# Patient Record
Sex: Female | Born: 1940
Health system: Southern US, Community
[De-identification: ages and names within clinical notes are randomized; demographics above are authoritative.]

## PROBLEM LIST (undated history)

## (undated) DIAGNOSIS — I2510 Atherosclerotic heart disease of native coronary artery without angina pectoris: Secondary | ICD-10-CM

## (undated) DIAGNOSIS — R0600 Dyspnea, unspecified: Secondary | ICD-10-CM

## (undated) DIAGNOSIS — G4730 Sleep apnea, unspecified: Secondary | ICD-10-CM

## (undated) DIAGNOSIS — R600 Localized edema: Secondary | ICD-10-CM

## (undated) DIAGNOSIS — F329 Major depressive disorder, single episode, unspecified: Secondary | ICD-10-CM

## (undated) DIAGNOSIS — K861 Other chronic pancreatitis: Secondary | ICD-10-CM

## (undated) DIAGNOSIS — M549 Dorsalgia, unspecified: Secondary | ICD-10-CM

## (undated) DIAGNOSIS — I639 Cerebral infarction, unspecified: Secondary | ICD-10-CM

## (undated) DIAGNOSIS — F419 Anxiety disorder, unspecified: Secondary | ICD-10-CM

## (undated) DIAGNOSIS — F32A Depression, unspecified: Secondary | ICD-10-CM

## (undated) DIAGNOSIS — M1990 Unspecified osteoarthritis, unspecified site: Secondary | ICD-10-CM

## (undated) DIAGNOSIS — T4145XA Adverse effect of unspecified anesthetic, initial encounter: Secondary | ICD-10-CM

## (undated) DIAGNOSIS — M109 Gout, unspecified: Secondary | ICD-10-CM

## (undated) DIAGNOSIS — Z87442 Personal history of urinary calculi: Secondary | ICD-10-CM

## (undated) DIAGNOSIS — I10 Essential (primary) hypertension: Secondary | ICD-10-CM

## (undated) DIAGNOSIS — E785 Hyperlipidemia, unspecified: Secondary | ICD-10-CM

## (undated) DIAGNOSIS — I219 Acute myocardial infarction, unspecified: Secondary | ICD-10-CM

## (undated) DIAGNOSIS — E119 Type 2 diabetes mellitus without complications: Secondary | ICD-10-CM

## (undated) HISTORY — PX: CHOLECYSTECTOMY: SHX55

## (undated) HISTORY — DX: Dyspnea, unspecified: R06.00

## (undated) HISTORY — DX: Cerebral infarction, unspecified: I63.9

## (undated) HISTORY — PX: CARDIAC CATHETERIZATION: SHX172

## (undated) HISTORY — DX: Type 2 diabetes mellitus without complications: E11.9

## (undated) HISTORY — DX: Acute myocardial infarction, unspecified: I21.9

## (undated) HISTORY — DX: Localized edema: R60.0

## (undated) HISTORY — DX: Dorsalgia, unspecified: M54.9

## (undated) HISTORY — DX: Gout, unspecified: M10.9

## (undated) HISTORY — PX: BACK SURGERY: SHX140

## (undated) HISTORY — PX: CARDIAC SURGERY: SHX584

---

## 1999-12-04 HISTORY — PX: CHOLECYSTECTOMY: SHX55

## 1999-12-26 ENCOUNTER — Encounter: Admission: RE | Admit: 1999-12-26 | Discharge: 1999-12-26 | Payer: Self-pay | Admitting: Internal Medicine

## 2000-01-31 ENCOUNTER — Ambulatory Visit (HOSPITAL_COMMUNITY): Admission: RE | Admit: 2000-01-31 | Discharge: 2000-01-31 | Payer: Self-pay | Admitting: Obstetrics & Gynecology

## 2000-05-31 ENCOUNTER — Encounter: Payer: Self-pay | Admitting: Internal Medicine

## 2000-05-31 ENCOUNTER — Ambulatory Visit (HOSPITAL_BASED_OUTPATIENT_CLINIC_OR_DEPARTMENT_OTHER): Admission: RE | Admit: 2000-05-31 | Discharge: 2000-05-31 | Payer: Self-pay | Admitting: Internal Medicine

## 2000-08-09 ENCOUNTER — Encounter: Admission: RE | Admit: 2000-08-09 | Discharge: 2000-11-07 | Payer: Self-pay | Admitting: Internal Medicine

## 2000-10-03 ENCOUNTER — Encounter: Admission: RE | Admit: 2000-10-03 | Discharge: 2000-10-03 | Payer: Self-pay | Admitting: Internal Medicine

## 2000-12-31 ENCOUNTER — Encounter: Admission: RE | Admit: 2000-12-31 | Discharge: 2000-12-31 | Payer: Self-pay | Admitting: Internal Medicine

## 2001-04-03 ENCOUNTER — Encounter: Admission: RE | Admit: 2001-04-03 | Discharge: 2001-04-03 | Payer: Self-pay | Admitting: Internal Medicine

## 2001-05-12 ENCOUNTER — Ambulatory Visit (HOSPITAL_COMMUNITY): Admission: RE | Admit: 2001-05-12 | Discharge: 2001-05-12 | Payer: Self-pay | Admitting: Gynecology

## 2001-12-03 DIAGNOSIS — T8859XA Other complications of anesthesia, initial encounter: Secondary | ICD-10-CM

## 2001-12-03 HISTORY — DX: Other complications of anesthesia, initial encounter: T88.59XA

## 2002-01-23 ENCOUNTER — Encounter: Admission: RE | Admit: 2002-01-23 | Discharge: 2002-01-23 | Payer: Self-pay | Admitting: Internal Medicine

## 2002-06-14 ENCOUNTER — Ambulatory Visit (HOSPITAL_COMMUNITY): Admission: RE | Admit: 2002-06-14 | Discharge: 2002-06-14 | Payer: Self-pay | Admitting: Internal Medicine

## 2002-06-18 ENCOUNTER — Ambulatory Visit (HOSPITAL_COMMUNITY): Admission: RE | Admit: 2002-06-18 | Discharge: 2002-06-18 | Payer: Self-pay | Admitting: Gastroenterology

## 2002-06-18 ENCOUNTER — Encounter (INDEPENDENT_AMBULATORY_CARE_PROVIDER_SITE_OTHER): Payer: Self-pay | Admitting: Specialist

## 2002-07-13 ENCOUNTER — Encounter (INDEPENDENT_AMBULATORY_CARE_PROVIDER_SITE_OTHER): Payer: Self-pay | Admitting: Specialist

## 2002-07-13 ENCOUNTER — Inpatient Hospital Stay (HOSPITAL_COMMUNITY): Admission: RE | Admit: 2002-07-13 | Discharge: 2002-07-21 | Payer: Self-pay | Admitting: General Surgery

## 2002-07-13 ENCOUNTER — Encounter (INDEPENDENT_AMBULATORY_CARE_PROVIDER_SITE_OTHER): Payer: Self-pay | Admitting: Cardiovascular Disease

## 2002-07-28 ENCOUNTER — Encounter: Admission: RE | Admit: 2002-07-28 | Discharge: 2002-07-28 | Payer: Self-pay | Admitting: Internal Medicine

## 2002-12-03 HISTORY — PX: BACK SURGERY: SHX140

## 2003-02-19 ENCOUNTER — Encounter: Admission: RE | Admit: 2003-02-19 | Discharge: 2003-02-19 | Payer: Self-pay | Admitting: Internal Medicine

## 2003-11-10 ENCOUNTER — Encounter: Admission: RE | Admit: 2003-11-10 | Discharge: 2003-11-10 | Payer: Self-pay | Admitting: Internal Medicine

## 2003-11-30 ENCOUNTER — Emergency Department (HOSPITAL_COMMUNITY): Admission: EM | Admit: 2003-11-30 | Discharge: 2003-11-30 | Payer: Self-pay | Admitting: Emergency Medicine

## 2004-02-21 ENCOUNTER — Encounter: Admission: RE | Admit: 2004-02-21 | Discharge: 2004-02-21 | Payer: Self-pay | Admitting: Internal Medicine

## 2004-02-29 ENCOUNTER — Encounter: Admission: RE | Admit: 2004-02-29 | Discharge: 2004-02-29 | Payer: Self-pay | Admitting: Internal Medicine

## 2004-10-19 ENCOUNTER — Encounter: Admission: RE | Admit: 2004-10-19 | Discharge: 2004-10-19 | Payer: Self-pay | Admitting: Internal Medicine

## 2005-02-21 ENCOUNTER — Encounter: Admission: RE | Admit: 2005-02-21 | Discharge: 2005-02-21 | Payer: Self-pay | Admitting: Internal Medicine

## 2006-02-15 ENCOUNTER — Encounter: Admission: RE | Admit: 2006-02-15 | Discharge: 2006-02-15 | Payer: Self-pay | Admitting: Internal Medicine

## 2006-02-25 ENCOUNTER — Encounter: Admission: RE | Admit: 2006-02-25 | Discharge: 2006-02-25 | Payer: Self-pay | Admitting: Internal Medicine

## 2006-03-18 ENCOUNTER — Encounter: Admission: RE | Admit: 2006-03-18 | Discharge: 2006-03-18 | Payer: Self-pay | Admitting: Internal Medicine

## 2007-02-28 ENCOUNTER — Encounter: Admission: RE | Admit: 2007-02-28 | Discharge: 2007-02-28 | Payer: Self-pay | Admitting: Internal Medicine

## 2008-03-16 ENCOUNTER — Encounter: Admission: RE | Admit: 2008-03-16 | Discharge: 2008-03-16 | Payer: Self-pay | Admitting: Internal Medicine

## 2009-03-17 ENCOUNTER — Encounter: Admission: RE | Admit: 2009-03-17 | Discharge: 2009-03-17 | Payer: Self-pay | Admitting: Internal Medicine

## 2009-07-21 ENCOUNTER — Encounter: Admission: RE | Admit: 2009-07-21 | Discharge: 2009-07-21 | Payer: Self-pay | Admitting: Neurosurgery

## 2010-03-29 ENCOUNTER — Encounter: Admission: RE | Admit: 2010-03-29 | Discharge: 2010-03-29 | Payer: Self-pay | Admitting: Internal Medicine

## 2010-12-24 ENCOUNTER — Encounter: Payer: Self-pay | Admitting: Internal Medicine

## 2011-03-21 ENCOUNTER — Other Ambulatory Visit: Payer: Self-pay | Admitting: Internal Medicine

## 2011-03-21 DIAGNOSIS — Z1231 Encounter for screening mammogram for malignant neoplasm of breast: Secondary | ICD-10-CM

## 2011-04-03 ENCOUNTER — Ambulatory Visit
Admission: RE | Admit: 2011-04-03 | Discharge: 2011-04-03 | Disposition: A | Discharge status: Home / Self Care | Payer: Medicare Other | Source: Ambulatory Visit | Attending: Internal Medicine | Admitting: Internal Medicine

## 2011-04-03 DIAGNOSIS — Z1231 Encounter for screening mammogram for malignant neoplasm of breast: Secondary | ICD-10-CM

## 2011-04-20 NOTE — Op Note (Signed)
TNAMEPEACE, NOYES                         ACCOUNT NO.:  [id number]   MEDICAL RECORD NO.:  [id number]                   PATIENT TYPE:  INP   LOCATION:  0460                                 FACILITY:  [facility]   PHYSICIAN:  Ollen Gross. Vernell Morgans, M.D.              DATE OF BIRTH:  May 20, 1941   DATE OF PROCEDURE:  07/15/2002  DATE OF DISCHARGE:  07/21/2002                                 OPERATIVE REPORT   PREOPERATIVE DIAGNOSES:  1. Cholelithiasis.  2. Prior aborted laparoscopic cholecystectomy due to intraoperative     hypertension.   POSTOPERATIVE DIAGNOSES:  1. Cholelithiasis.  2. Prior aborted laparoscopic cholecystectomy due to intraoperative     hypertension.   PROCEDURE:  Laparoscopic cholecystectomy.   SURGEON:  Ollen Gross. Carolynne Edouard, M.D.   ASSISTANT:  Currie Paris, M.D.   ANESTHESIA:  General endotracheal.   DESCRIPTION OF PROCEDURE:  The patient's prior laparoscopic cholecystectomy  was aborted due a hypertensive episode during the initial surgery two days  ago.  The procedure was stopped, and she was taken to the ICU for further  evaluation of this episode and resuscitation.  Once she had stabilized, she  was kept intubated, and plans were made to bring her back to the operating  room today for completion cholecystectomy.  After informed consent was  obtained, the patient was brought to the operating room and placed in a  supine position on the operating room table.  After adequate induction of  general anesthesia, the patient's abdomen was prepped with Betadine and  draped in the usual sterile fashion.  Her staples from her prior procedure  were removed.  The supraumbilical incision was then reopened, and the 0  Vicryl pursestring stitch was cut.  The fascial edges were grabbed with  Kocher clamps, and a second 0 Vicryl pursestring stitch was placed.  A  Hasson cannula was placed through this opening and anchored in place with  the previously-placed Vicryl  pursestring stitch.  The abdomen was then  insufflated with carbon dioxide without difficulty.  The patient was placed  in a head-up position, and a laparoscope was placed through the Hasson  cannula.  The gallbladder was readily identified.  A 10 mm port was then  placed bluntly through the upper midline incision under direct vision  without difficulty.  The two 5 mm ports were then placed bluntly through the  prior incisions into the abdominal cavity again, under direct vision without  difficulty.  A blunt grasper was placed through the lateral most 5 mm port  and used to grasp the dome of the gallbladder and elevate it anteriorly and  superiorly.  Another blunt grasper was placed through the other 5 mm port  and used to retract on the body and neck of the gallbladder.  A dissector  was placed through the upper midline port.  The neck of the gallbladder was  identified during the  first procedure.  Three clips had been placed  proximally on the cystic duct and one distally and during this procedure,  the duct was divided with laparoscopic scissors.  Posterior to this, the  cystic artery was identified and dissected bluntly in a circumferential  manner until a good window was created.  Two clips were placed proximally  and one distally on the cystic artery, and the artery was divided between  the two.  Next, a laparoscopic hook device was used to separate the  gallbladder from the liver bed.  Prior to completely detaching the  gallbladder from the liver bed, the liver bed was inspected, and several  small bleeding points required coagulation with the electrocautery until the  area was completely hemostatic.  The gallbladder was then detached the rest  of the way from the liver bed.  An endoscopic bag was placed within the  patient, and the gallbladder was placed within the bag, and the bag was  sealed.  The laparoscope was then moved to the upper midline port, and a  gallbladder grasper was  placed through the Hasson cannula and grasped the  bag.  The bag and gallbladder were then removed with the Hasson cannula  through the supraumbilical port.  The Hasson cannula was then re-placed.  The abdomen was then irrigated with copious amounts of saline until the  effluent was clear.  The Hasson was then removed, and the fascia was closed  with the previously-placed Vicryl pursestring stitch under direct vision.  The rest of the ports were then removed under direct vision and were all  hemostatic, and the gas was allowed to escape.  The skin incisions were then  closed with interrupted 4-0 Monocryl subcuticular stitches.  Benzoin, Steri-  Strips, and sterile dressings were applied.  The patient tolerated the  procedure well.  At the end of the case, all needle, sponge, and instrument  counts were correct.  The patient was awakened and taken back to the ICU.  She was too sleepy to be extubated, and she was taken to the ICU for  monitoring at this point but in stable condition.                                               Ollen Gross. Vernell Morgans, M.D.    PST/MEDQ  D:  07/28/2002  T:  07/30/2002  Job:  [id number]

## 2011-04-20 NOTE — Procedures (Signed)
[facility]  Patient:    Judy [name], Judy [name] Visit Number: [id number] MRN: [id number]          Service Type: END Location: ENDO Attending Physician:  Nelda Marseille Dictated by:   Petra Kuba, M.D. Proc. Date: 06/18/02 Admit Date:  06/18/2002 Discharge Date: 06/18/2002   CC:         Erskine Speed, M.D.  Anselm Pancoast. Zachery Dakins, M.D.   Procedure Report  PROCEDURE:  Colonoscopy with polypectomy.  INDICATION:  Family history of colon cancer, due for colonic screening, abdominal pain, gallstones.  Consent was signed after risks, benefits, methods, and options thoroughly discussed in the office.  MEDICATIONS:  Demerol 70, Versed 7.  DESCRIPTION OF PROCEDURE:  Rectal inspection was pertinent for external hemorrhoids, small.  Digital exam was negative.  The pediatric video adjustable colonoscope was inserted and with some difficulty due to a tortuous colon, was able to be advanced to the cecum.  On insertion, some occasional left and right diverticula were seen.  No other abnormalities were seen as we advanced to cecum which was identified by the appendiceal orifice and the ileocecal valve.  This did require rolling her on her back and then on her right side with various abdominal pressures.  The scope was inserted a short ways for a short period of time into the terminal ileum but on quick evaluation, it was normal.  Scope was slowly withdrawn.  Prep was adequate. There was some liquid stool that required washing and suctioning.  On slow withdrawal through the colon, other than the left and right occasional scattered diverticula, two tiny descending, transverse polyps were seen and were not biopsied x 1 each.  No other abnormalities were seen other than the tortuous sigmoid as we withdrew back to the rectum.  Once back in the rectum, the scope was retroflexed, pertinent for some internal hemorrhoids.  The scope was straightened and readvanced a  short ways up the left side of the colon; air was suctioned, and the scope removed.  The patient tolerated the procedure adequately.  There was no obvious immediate complication.  ENDOSCOPIC DIAGNOSES: 1. Internal and external hemorrhoids. 2. Scattered left and right diverticula, mostly in the sigmoid and ascending. 3. Tortuous sigmoid. 4. Two tiny descending, transverse polyps, hot biopsied. 5. Otherwise within normal limits to the cecum with a quick look in the    terminal ileum.  PLAN: 1. Await pathology to determine future colonic screening. 2. Would probably proceed with a laparoscopic cholecystectomy but if there is    a question about etiologies of her pain, would get a one time upper GI    small-bowel follow-through next. 3. Happy to see back p.r.n. 4. Otherwise leave further work-up and plans to Dr. Chilton Si and Dr. Zachery Dakins. Dictated by:   Petra Kuba, M.D. Attending Physician:  Nelda Marseille DD:  06/18/02 TD:  06/23/02 Job: [id number]

## 2011-04-20 NOTE — Op Note (Signed)
Judy [name], Judy [name]                         ACCOUNT NO.:  [id number]   MEDICAL RECORD NO.:  [id number]                   PATIENT TYPE:  INP   LOCATION:  0157                                 FACILITY:  [facility]   PHYSICIAN:  Ollen Gross. Vernell Morgans, M.D.              DATE OF BIRTH:  12-28-1940   DATE OF PROCEDURE:  07/13/2002  DATE OF DISCHARGE:                                 OPERATIVE REPORT   PREOPERATIVE DIAGNOSIS:  Cholelithiasis.   POSTOPERATIVE DIAGNOSIS:  Cholelithiasis.   PROCEDURE:  Laparoscopic cholecystectomy/aborted.   SURGEON:  Ollen Gross. Carolynne Edouard, M.D.   ASSISTANT:  Dr. Earlene Plater.   ANESTHESIA:  General endotracheal.   DESCRIPTION OF PROCEDURE:  After informed consent was obtained, the patient  was brought to the operating room and placed in the supine position on the  operating room table.  After adequate induction of general anesthesia, the  patient's abdomen was prepped with Betadine and draped in the usual sterile  manner.  The area below the umbilicus was infiltrated with 0.25% Marcaine,  and a small, vertically-oriented incision was made.  This incision was  carried down through the subcutaneous tissue using blunt dissection with  Army-[employer]y retractors and a Kelly clamp until the linea alba was identified.  The linea alba was incised with a 15 blade knife, and each side was grasped  with Kocher clamps and elevated anteriorly.  The preperitoneal space was  then probed bluntly with a hemostat until the peritoneum was opened and  access was gained to the abdominal cavity.  A finger was inserted through  this opening, and there were no apparent adhesions to the anterior abdominal  wall.  A 0 Vicryl pursestring stitch was placed in the fascia surrounding  this opening.  A Hasson cannula was then placed through this opening and  anchored in place with the previously-placed Vicryl pursestring stitch.  The  abdomen was then insufflated with carbon dioxide without difficulty.  A  laparoscope was placed through the Hasson cannula, and the patient was  placed in a head-up position.  The liver edge and dome of the gallbladder  were readily identifiable.  There were no adhesions to the gallbladder.  After infiltrating this area with 0.25% Marcaine, a small, transverse, upper  midline incision was made with the 15 blade knife.  A 10 mm port was then  placed bluntly through this incision into the abdominal cavity under direct  vision.  Sites were then chosen laterally on the right side of the abdomen  for placement of 5 mm ports, and each of these areas were infiltrated with  0.25% Marcaine.  Small stab incisions were made with the 15 blade knife, and  the 5 mm ports were placed bluntly through these incisions into the  abdominal cavity under direct vision.  A blunt grasper was then placed  through the lateral-most 5 mm port and used to grasp the dome of  the  gallbladder and elevate it anteriorly and superiorly.  Another blunt grasper  was placed through the other 5 mm port and used to retract on the body and  neck of the gallbladder.  A dissector was placed through the upper midline  port and using a combination of the electrocautery and blunt dissection, the  gallbladder neck area was identified as well as the gallbladder neck cystic  duct junction.  This area was dissected bluntly in a circumferential manner  until a good window was created.  Care was taken to keep the common duct  medial to this dissection.  Three clips were then placed proximally and one  distally on the cystic duct.  At this point in time, the patient developed  an acute onset of hypotension.  The insufflation was released, and the  patient was placed in Trendelenburg position.  Volume resuscitation was  begun as well as the administration of dopamine, epinephrine, and Neo.  The  patient seemed to be unresponsive to these medicines and to the volume  resuscitation, although her heart rate remained in  the 70's.  Her oxygen  saturations also remained at 100, and she did not appear to be hypoxic in  any way.  The fluoroscopy machine was brought in, and her chest was  examined, and both lungs appeared to be inflating well without any evidence  for pneumothorax.  She had no subcutaneous air either in her chest wall or  in her neck.  Her ET tube appeared to be in right mainstem, and this was  backed up to just above the carina, but this did not change her clinical  course.  Cardiology was consulted intraoperatively, and they arrived very  quickly and did a transthoracic echocardiogram which showed good wall motion  of the heart and certainly no obvious appearance of any kind of infarction  or wall motion abnormality.  An arterial line as well as a central venous  introducer and Swan-Ganz catheter were introduced, and the arterial line  confirmed her blood pressure in the 60's systolic.  The Swan-Ganz catheter  was floated to the appropriate position, and her filling pressures appeared  to be adequate.  At this point, the laparoscope was inserted through the  upper midline port just to look above the liver one more time without  insufflating the abdomen, and there was no accumulation of blood in this  area.  At this point, the laparoscope was removed, and all of the ports were  removed.  The fascia of the infraumbilical port was closed with the  previously-placed Vicryl pursestring stitch.  Staples and sterile dressings  were applied.  The patient was then taken to the recovery room for further  observation and resuscitation.  Plans were made to, if the patient  stabilized, to bring her back to the operating room tomorrow to finish her  operation.                                               Ollen Gross. Vernell Morgans, M.D.    PST/MEDQ  D:  07/13/2002  T:  07/15/2002  Job:  [id number]

## 2011-04-20 NOTE — Discharge Summary (Signed)
Judy [name], Judy [name]                          ACCOUNT NO.:  [id number]   MEDICAL RECORD NO.:  [id number]                   PATIENT TYPE:  INP   LOCATION:  0460                                 FACILITY:  [facility]   PHYSICIAN:  Ollen Gross. Vernell Morgans, M.D.              DATE OF BIRTH:  08-09-1941   DATE OF ADMISSION:  07/13/2002  DATE OF DISCHARGE:  07/21/2002                                 DISCHARGE SUMMARY   HISTORY OF PRESENT ILLNESS:  Ms. Giebel is a 70 year old white female who  was brought to the hospital for a laparoscopic cholecystectomy  intraoperatively. About 20 minutes into the operation, she developed severe  hypotension. The operation was stopped. Care was taken to make sure this was  not due to intraoperative blood loss in the abdomen and no blood was  visualized in the abdomen. The fluoroscopy machine was used to make sure she  did not have a pneumothorax causing this, which she did not. The  Cardiologists were consulted and intraoperatively came and 2-D  echocardiogramed her heart which showed good LV function. A Swan-Ganz  catheter was placed that showed that she had good adequate filling pressure.  She was volume resuscitated and stared on Dopamine, Levophed, and  Epinephrine. The surgery at this point was abandoned. Her incisions were  closed with staples and dressed sterilely and taken to the ICU for further  evaluation. Resuscitation measures continued there and a Critical Care  consult was obtained. It was unclear what caused this episode but over the  next few hours, she slowly recovered and that evening, was awake and alert  on the ventilator and able to follow commands and move all four extremities.  Two days postoperative, she was taken back to the OR where her laparoscopic  cholecystectomy was completed. She tolerated this well. Postoperatively, she  was taken back to the ICU to be allowed to wake up fully before extubating  her, being that she was a difficult  intubation during the first operation.  By the next morning, she was able to be extubated. She did very well  hemodynamically. At this point, her Swan-Ganz catheter was discontinued.  Foley catheter was discontinued. She was started on a diet and advanced  slowly and by the 19th, was ready for discharge home.   CONDITION ON DISCHARGE:  Stable.   FINAL DIAGNOSES:  Cholecystitis, cholelithiasis.   MEDICATIONS:  1. She is to resume her home medications.  2. She is given prescription for Vicodin for pain.   DIET:  As tolerated.   ACTIVITY:  No heavy lifting.   FOLLOW UP:  With Dr. Carolynne Edouard in two weeks.   DISPOSITION:  To home.  Ollen Gross. Vernell Morgans, M.D.    PST/MEDQ  D:  08/05/2002  T:  08/06/2002  Job:  [id number] [id number]

## 2011-12-04 HISTORY — PX: COLONOSCOPY: SHX174

## 2012-02-13 ENCOUNTER — Other Ambulatory Visit: Payer: Self-pay | Admitting: Gastroenterology

## 2012-03-18 ENCOUNTER — Ambulatory Visit
Admission: RE | Admit: 2012-03-18 | Discharge: 2012-03-18 | Disposition: A | Discharge status: Home / Self Care | Payer: Medicare Other | Source: Ambulatory Visit | Attending: Neurosurgery | Admitting: Neurosurgery

## 2012-03-18 ENCOUNTER — Other Ambulatory Visit: Payer: Self-pay | Admitting: Neurosurgery

## 2012-03-18 DIAGNOSIS — M48061 Spinal stenosis, lumbar region without neurogenic claudication: Secondary | ICD-10-CM

## 2012-04-02 ENCOUNTER — Other Ambulatory Visit: Payer: Self-pay | Admitting: Internal Medicine

## 2012-04-02 DIAGNOSIS — Z1231 Encounter for screening mammogram for malignant neoplasm of breast: Secondary | ICD-10-CM

## 2012-04-11 ENCOUNTER — Ambulatory Visit
Admission: RE | Admit: 2012-04-11 | Discharge: 2012-04-11 | Disposition: A | Discharge status: Home / Self Care | Payer: Medicare Other | Source: Ambulatory Visit | Attending: Internal Medicine | Admitting: Internal Medicine

## 2012-04-11 DIAGNOSIS — Z1231 Encounter for screening mammogram for malignant neoplasm of breast: Secondary | ICD-10-CM

## 2013-04-07 ENCOUNTER — Other Ambulatory Visit: Payer: Self-pay

## 2013-04-07 DIAGNOSIS — Z1231 Encounter for screening mammogram for malignant neoplasm of breast: Secondary | ICD-10-CM

## 2013-05-05 ENCOUNTER — Ambulatory Visit
Admission: RE | Admit: 2013-05-05 | Discharge: 2013-05-05 | Disposition: A | Discharge status: Home / Self Care | Payer: Medicare Other | Source: Ambulatory Visit

## 2013-05-05 DIAGNOSIS — Z1231 Encounter for screening mammogram for malignant neoplasm of breast: Secondary | ICD-10-CM

## 2014-05-07 ENCOUNTER — Other Ambulatory Visit: Payer: Self-pay

## 2014-05-07 DIAGNOSIS — Z1231 Encounter for screening mammogram for malignant neoplasm of breast: Secondary | ICD-10-CM

## 2014-05-19 ENCOUNTER — Ambulatory Visit
Admission: RE | Admit: 2014-05-19 | Discharge: 2014-05-19 | Disposition: A | Discharge status: Home / Self Care | Payer: Medicare Other | Source: Ambulatory Visit

## 2014-05-19 DIAGNOSIS — Z1231 Encounter for screening mammogram for malignant neoplasm of breast: Secondary | ICD-10-CM

## 2014-12-14 DIAGNOSIS — H2513 Age-related nuclear cataract, bilateral: Secondary | ICD-10-CM | POA: Diagnosis not present

## 2015-01-05 DIAGNOSIS — G4733 Obstructive sleep apnea (adult) (pediatric): Secondary | ICD-10-CM | POA: Diagnosis not present

## 2015-01-13 DIAGNOSIS — K5750 Diverticulosis of both small and large intestine without perforation or abscess without bleeding: Secondary | ICD-10-CM | POA: Diagnosis not present

## 2015-01-13 DIAGNOSIS — I10 Essential (primary) hypertension: Secondary | ICD-10-CM | POA: Diagnosis not present

## 2015-03-02 ENCOUNTER — Other Ambulatory Visit (HOSPITAL_COMMUNITY): Payer: Self-pay | Admitting: Internal Medicine

## 2015-03-02 ENCOUNTER — Encounter (HOSPITAL_COMMUNITY): Payer: Self-pay

## 2015-03-02 ENCOUNTER — Ambulatory Visit (HOSPITAL_COMMUNITY)
Admission: RE | Admit: 2015-03-02 | Discharge: 2015-03-02 | Disposition: A | Discharge status: Home / Self Care | Payer: Medicare Other | Source: Ambulatory Visit | Attending: Internal Medicine | Admitting: Internal Medicine

## 2015-03-02 DIAGNOSIS — I2510 Atherosclerotic heart disease of native coronary artery without angina pectoris: Secondary | ICD-10-CM | POA: Diagnosis not present

## 2015-03-02 DIAGNOSIS — R778 Other specified abnormalities of plasma proteins: Secondary | ICD-10-CM | POA: Insufficient documentation

## 2015-03-02 DIAGNOSIS — R0789 Other chest pain: Secondary | ICD-10-CM | POA: Diagnosis not present

## 2015-03-02 DIAGNOSIS — Z86711 Personal history of pulmonary embolism: Secondary | ICD-10-CM

## 2015-03-02 DIAGNOSIS — R791 Abnormal coagulation profile: Secondary | ICD-10-CM | POA: Diagnosis not present

## 2015-03-02 DIAGNOSIS — R079 Chest pain, unspecified: Secondary | ICD-10-CM | POA: Insufficient documentation

## 2015-03-02 DIAGNOSIS — R799 Abnormal finding of blood chemistry, unspecified: Secondary | ICD-10-CM | POA: Diagnosis not present

## 2015-03-02 HISTORY — DX: Essential (primary) hypertension: I10

## 2015-03-02 LAB — POCT I-STAT CREATININE: Creatinine, Ser: 1.4 mg/dL — ABNORMAL HIGH (ref 0.50–1.10)

## 2015-03-02 MED ORDER — IOHEXOL 350 MG/ML SOLN
100.0000 mL | Freq: Once | INTRAVENOUS | Status: AC | PRN
  Administered 2015-03-02: 80 mL via INTRAVENOUS

## 2015-05-05 ENCOUNTER — Other Ambulatory Visit: Payer: Self-pay

## 2015-05-05 DIAGNOSIS — Z1231 Encounter for screening mammogram for malignant neoplasm of breast: Secondary | ICD-10-CM

## 2015-05-11 DIAGNOSIS — G4733 Obstructive sleep apnea (adult) (pediatric): Secondary | ICD-10-CM | POA: Diagnosis not present

## 2015-06-02 ENCOUNTER — Ambulatory Visit
Admission: RE | Admit: 2015-06-02 | Discharge: 2015-06-02 | Disposition: A | Discharge status: Home / Self Care | Payer: Medicare Other | Source: Ambulatory Visit

## 2015-06-02 DIAGNOSIS — Z1231 Encounter for screening mammogram for malignant neoplasm of breast: Secondary | ICD-10-CM | POA: Diagnosis not present

## 2015-06-28 DIAGNOSIS — D559 Anemia due to enzyme disorder, unspecified: Secondary | ICD-10-CM | POA: Diagnosis not present

## 2015-06-28 DIAGNOSIS — E118 Type 2 diabetes mellitus with unspecified complications: Secondary | ICD-10-CM | POA: Diagnosis not present

## 2015-06-28 DIAGNOSIS — I10 Essential (primary) hypertension: Secondary | ICD-10-CM | POA: Diagnosis not present

## 2015-06-28 DIAGNOSIS — E282 Polycystic ovarian syndrome: Secondary | ICD-10-CM | POA: Diagnosis not present

## 2015-06-28 DIAGNOSIS — E039 Hypothyroidism, unspecified: Secondary | ICD-10-CM | POA: Diagnosis not present

## 2015-06-28 DIAGNOSIS — Z23 Encounter for immunization: Secondary | ICD-10-CM | POA: Diagnosis not present

## 2015-06-28 DIAGNOSIS — E780 Pure hypercholesterolemia: Secondary | ICD-10-CM | POA: Diagnosis not present

## 2015-06-28 DIAGNOSIS — Z0000 Encounter for general adult medical examination without abnormal findings: Secondary | ICD-10-CM | POA: Diagnosis not present

## 2015-08-12 DIAGNOSIS — G4733 Obstructive sleep apnea (adult) (pediatric): Secondary | ICD-10-CM | POA: Diagnosis not present

## 2015-08-23 DIAGNOSIS — G4733 Obstructive sleep apnea (adult) (pediatric): Secondary | ICD-10-CM | POA: Diagnosis not present

## 2015-09-22 DIAGNOSIS — G4733 Obstructive sleep apnea (adult) (pediatric): Secondary | ICD-10-CM | POA: Diagnosis not present

## 2015-10-23 DIAGNOSIS — G4733 Obstructive sleep apnea (adult) (pediatric): Secondary | ICD-10-CM | POA: Diagnosis not present

## 2015-11-07 DIAGNOSIS — I10 Essential (primary) hypertension: Secondary | ICD-10-CM | POA: Diagnosis not present

## 2015-11-07 DIAGNOSIS — R32 Unspecified urinary incontinence: Secondary | ICD-10-CM | POA: Diagnosis not present

## 2015-11-22 DIAGNOSIS — G4733 Obstructive sleep apnea (adult) (pediatric): Secondary | ICD-10-CM | POA: Diagnosis not present

## 2015-12-23 DIAGNOSIS — G4733 Obstructive sleep apnea (adult) (pediatric): Secondary | ICD-10-CM | POA: Diagnosis not present

## 2016-01-18 DIAGNOSIS — G4733 Obstructive sleep apnea (adult) (pediatric): Secondary | ICD-10-CM | POA: Diagnosis not present

## 2016-01-23 DIAGNOSIS — G4733 Obstructive sleep apnea (adult) (pediatric): Secondary | ICD-10-CM | POA: Diagnosis not present

## 2016-02-02 DIAGNOSIS — M109 Gout, unspecified: Secondary | ICD-10-CM | POA: Diagnosis not present

## 2016-02-07 DIAGNOSIS — M109 Gout, unspecified: Secondary | ICD-10-CM | POA: Diagnosis not present

## 2016-02-20 DIAGNOSIS — G4733 Obstructive sleep apnea (adult) (pediatric): Secondary | ICD-10-CM | POA: Diagnosis not present

## 2016-03-03 DIAGNOSIS — K861 Other chronic pancreatitis: Secondary | ICD-10-CM

## 2016-03-03 HISTORY — DX: Other chronic pancreatitis: K86.1

## 2016-03-16 ENCOUNTER — Emergency Department (HOSPITAL_COMMUNITY)
Admission: EM | Admit: 2016-03-16 | Discharge: 2016-03-17 | Disposition: A | Discharge status: Home / Self Care | Payer: Medicare Other | Attending: Emergency Medicine | Admitting: Emergency Medicine

## 2016-03-16 ENCOUNTER — Emergency Department (HOSPITAL_COMMUNITY): Payer: Medicare Other

## 2016-03-16 ENCOUNTER — Encounter (HOSPITAL_COMMUNITY): Payer: Self-pay

## 2016-03-16 DIAGNOSIS — R0600 Dyspnea, unspecified: Secondary | ICD-10-CM | POA: Diagnosis not present

## 2016-03-16 DIAGNOSIS — I129 Hypertensive chronic kidney disease with stage 1 through stage 4 chronic kidney disease, or unspecified chronic kidney disease: Secondary | ICD-10-CM | POA: Insufficient documentation

## 2016-03-16 DIAGNOSIS — F419 Anxiety disorder, unspecified: Secondary | ICD-10-CM | POA: Diagnosis not present

## 2016-03-16 DIAGNOSIS — R1010 Upper abdominal pain, unspecified: Secondary | ICD-10-CM | POA: Diagnosis present

## 2016-03-16 DIAGNOSIS — R1084 Generalized abdominal pain: Secondary | ICD-10-CM

## 2016-03-16 DIAGNOSIS — F329 Major depressive disorder, single episode, unspecified: Secondary | ICD-10-CM | POA: Insufficient documentation

## 2016-03-16 DIAGNOSIS — R1013 Epigastric pain: Secondary | ICD-10-CM | POA: Diagnosis not present

## 2016-03-16 DIAGNOSIS — K5730 Diverticulosis of large intestine without perforation or abscess without bleeding: Secondary | ICD-10-CM | POA: Diagnosis not present

## 2016-03-16 DIAGNOSIS — N179 Acute kidney failure, unspecified: Secondary | ICD-10-CM | POA: Diagnosis not present

## 2016-03-16 DIAGNOSIS — Z79899 Other long term (current) drug therapy: Secondary | ICD-10-CM | POA: Diagnosis not present

## 2016-03-16 DIAGNOSIS — N189 Chronic kidney disease, unspecified: Secondary | ICD-10-CM | POA: Diagnosis not present

## 2016-03-16 HISTORY — DX: Depression, unspecified: F32.A

## 2016-03-16 HISTORY — DX: Major depressive disorder, single episode, unspecified: F32.9

## 2016-03-16 HISTORY — DX: Anxiety disorder, unspecified: F41.9

## 2016-03-16 MED ORDER — ALBUTEROL SULFATE (2.5 MG/3ML) 0.083% IN NEBU
5.0000 mg | INHALATION_SOLUTION | Freq: Once | RESPIRATORY_TRACT | Status: AC
  Administered 2016-03-16: 5 mg via RESPIRATORY_TRACT
  Filled 2016-03-16: qty 6

## 2016-03-16 MED ORDER — ONDANSETRON HCL 4 MG/2ML IJ SOLN
4.0000 mg | Freq: Once | INTRAMUSCULAR | Status: AC
  Administered 2016-03-17: 4 mg via INTRAVENOUS
  Filled 2016-03-16: qty 2

## 2016-03-16 MED ORDER — FENTANYL CITRATE (PF) 100 MCG/2ML IJ SOLN
50.0000 ug | Freq: Once | INTRAMUSCULAR | Status: AC
  Administered 2016-03-17: 50 ug via INTRAVENOUS
  Filled 2016-03-16: qty 2

## 2016-03-16 MED ORDER — SODIUM CHLORIDE 0.9 % IV SOLN
INTRAVENOUS | Status: DC
  Administered 2016-03-17: via INTRAVENOUS

## 2016-03-16 NOTE — ED Notes (Signed)
Pt arrives w/ c/o sob and 8/10 epigastric pain. Pt states this has been happening since Tuesday. Pt A+OX4.

## 2016-03-16 NOTE — ED Provider Notes (Addendum)
By signing my name below, I, Judy [name], attest that this documentation has been prepared under the direction and in the presence of [employer], DO. Electronically Signed: Randa [name], ED Scribe. 03/17/2016. 12:14 AM.  TIME SEEN: 11:40 PM  CHIEF COMPLAINT: Abdominal Pain and SOB  HPI: HPI Comments: Judy [name] is a 75 y.o. female with history of hypertension who presents to the Emergency Department complaining of aching-throbbing upper abdominal pain without radiation onset 3 days prior. Pt reports that the onset of pain began 3 days prior after eating a hotdog. Pt states that she has also been feeling SOB 2 days prior. No SOB now.  Pt reports nausea, diarrhea and abdominal distention. Pt states she has tried Tums with no relief. Pt states that she is not producing flatus only belching the last 3 days. Pt denies CP or chest discomfort, vomiting, blood in stool, melena, dysuria, hematuria, vaginal bleeding or vaginal discharge. Reports Hx of hysterectomy and cholecystectomy . Denies HX of ulcers. Has never had an endoscopy. Pt denies frequent NSAID use. Pt denies alcohol or tobacco use.    PCP - Dr. Nyoka Cowden  ROS: See HPI Constitutional: no fever  Eyes: no drainage  ENT: no runny nose   Cardiovascular:  no chest pain  Resp: no SOB  GI: no vomiting GU: no dysuria Integumentary: no rash  Allergy: no hives  Musculoskeletal: no leg swelling  Neurological: no slurred speech ROS otherwise negative  PAST MEDICAL HISTORY/PAST SURGICAL HISTORY:  Past Medical History  Diagnosis Date  . Hypertension   . Anxiety   . Depression     MEDICATIONS:  Prior to Admission medications   Medication Sig Start Date End Date Taking? Authorizing Provider  clorazepate (TRANXENE) 3.75 MG tablet Take 3.75 mg by mouth at bedtime.  03/14/16  Yes Historical Provider, MD  diltiazem (CARDIZEM CD) 240 MG 24 hr capsule Take 240 mg by mouth daily.  03/05/16  Yes Historical Provider, MD  imipramine  (TOFRANIL) 50 MG tablet Take 50 mg by mouth at bedtime.   Yes Historical Provider, MD  lisinopril (PRINIVIL,ZESTRIL) 40 MG tablet Take 40 mg by mouth daily.  01/11/16  Yes Historical Provider, MD  spironolactone (ALDACTONE) 50 MG tablet Take 50 mg by mouth daily.  03/05/16  Yes Historical Provider, MD    ALLERGIES:  No Known Allergies  SOCIAL HISTORY:  Social History  Substance Use Topics  . Smoking status: Never Smoker   . Smokeless tobacco: Not on file  . Alcohol Use: No    FAMILY HISTORY: History reviewed. No pertinent family history.  EXAM: BP 116/58 mmHg  Pulse 101  Temp(Src) 97.7 F (36.5 C) (Oral)  Resp 23  SpO2 97%   CONSTITUTIONAL: Alert and oriented and responds appropriately to questions. Well-appearing; well-nourished, elderly  HEAD: Normocephalic EYES: Conjunctivae clear, PERRL ENT: normal nose; no rhinorrhea; moist mucous membranes NECK: Supple, no meningismus, no LAD  CARD: RRR; S1 and S2 appreciated; no murmurs, no clicks, no rubs, no gallops RESP: Normal chest excursion without splinting or tachypnea; breath sounds clear and equal bilaterally; no wheezes, no rhonchi, no rales, no hypoxia or respiratory distress, speaking full sentences ABD/GI: Normal bowel sounds; tender to palpation diffusely worse throughout upper abdomen and distended without obvious timpani or fluid wave, no rebound, no guarding, no peritoneal signs BACK:  The back appears normal and is non-tender to palpation, there is no CVA tenderness EXT: Normal ROM in all joints; non-tender to palpation; no edema; normal capillary refill; no cyanosis,  no calf tenderness or swelling    SKIN: Normal color for age and race; warm; no rash NEURO: Moves all extremities equally, sensation to light touch intact diffusely, cranial nerves II through XII intact PSYCH: The patient's mood and manner are appropriate. Grooming and personal hygiene are appropriate.  MEDICAL DECISION MAKING: Patient here with abdominal  pain after eating a hot dog. Has had a cholecystectomy. No history of peptic ulcer disease, alcohol or NSAID use. Abdomen does appear distended and she is diffusely tender worse without the upper abdomen. Differential diagnosis includes pancreatitis, gastritis, colitis, less likely diverticulitis, pyelonephritis. Will obtain labs, CT of abdomen and pelvis, urine. EKG shows no ischemic abnormality. I doubt this is cardiac in nature. Chest x-ray ordered in triage is negative. Lungs are clear to auscultation. No hypoxia currently.  ED PROGRESS: 2:40 AM  Pt still having pain.  Labs show leukocytosis with left shift. She does also have mild acute on chronic renal failure.  She has received IV hydration. Urine shows trace leukocytes and few bacteria but no significant sign of infection. CT scan shows no bowel obstruction. She has diverticulosis without diverticulitis. She still having some discomfort. We'll give GI cocktail, Protonix and reassess.  4:30 AM  Pt reports feeling much better. She does have brief drop in her oxygen saturation when she is asleep it comes back up to 94% on room air at rest. No current shortness of breath. Lungs are clear to auscultation and she had a clear chest x-ray. Suspect she may have some component of sleep apnea.  Have advised her of her mildly elevated creatinine and recommended close outpatient follow-up for recheck. Recommend she increase her fluid intake. We'll start her on Protonix as a suspect there may be some component of gastritis to this. Have advised her to avoid NSAIDs because of her epigastric pain but also because of her acute on chronic renal failure. We'll discharge on Carafate and Zofran to take as well as needed for pain. Will give outpatient GI follow-up. She has a PCP for follow-up. Doubt that this is ACS, PE, dissection. She is status post cholecystectomy. LFTs and lipase are normal today.   At this time, I do not feel there is any life-threatening condition  present. I have reviewed and discussed all results (EKG, imaging, lab, urine as appropriate), exam findings with patient. I have reviewed nursing notes and appropriate previous records.  I feel the patient is safe to be discharged home without further emergent workup. Discussed usual and customary return precautions. Patient and family (if present) verbalize understanding and are comfortable with this plan.  Patient will follow-up with their primary care provider. If they do not have a primary care provider, information for follow-up has been provided to them. All questions have been answered.      EKG Interpretation  Date/Time:  Friday March 16 2016 22:00:13 EDT Ventricular Rate:  98 PR Interval:  188 QRS Duration: 95 QT Interval:  343 QTC Calculation: 438 R Axis:   90 Text Interpretation:  Sinus rhythm Borderline right axis deviation Low voltage, precordial leads No significant change since last tracing Confirmed by Porsha Skilton,  DO, Revonda Menter [phone]) on 03/16/2016 11:27:36 PM         I personally performed the services described in this documentation, which was scribed in my presence. The recorded information has been reviewed and is accurate.    [place], DO 03/17/16 [place], DO 03/17/16 [phone]

## 2016-03-17 ENCOUNTER — Emergency Department (HOSPITAL_COMMUNITY): Payer: Medicare Other

## 2016-03-17 DIAGNOSIS — K5730 Diverticulosis of large intestine without perforation or abscess without bleeding: Secondary | ICD-10-CM | POA: Diagnosis not present

## 2016-03-17 LAB — CBC WITH DIFFERENTIAL/PLATELET
Basophils Absolute: 0 10*3/uL (ref 0.0–0.1)
Basophils Relative: 0 %
EOS ABS: 0.1 10*3/uL (ref 0.0–0.7)
Eosinophils Relative: 1 %
HEMATOCRIT: 39.9 % (ref 36.0–46.0)
HEMOGLOBIN: 13.3 g/dL (ref 12.0–15.0)
LYMPHS ABS: 1.1 10*3/uL (ref 0.7–4.0)
Lymphocytes Relative: 6 %
MCH: 30.6 pg (ref 26.0–34.0)
MCHC: 33.3 g/dL (ref 30.0–36.0)
MCV: 91.9 fL (ref 78.0–100.0)
MONOS PCT: 4 %
Monocytes Absolute: 0.6 10*3/uL (ref 0.1–1.0)
NEUTROS ABS: 15.5 10*3/uL — AB (ref 1.7–7.7)
Neutrophils Relative %: 89 %
Platelets: 345 10*3/uL (ref 150–400)
RBC: 4.34 MIL/uL (ref 3.87–5.11)
RDW: 12.7 % (ref 11.5–15.5)
WBC: 17.4 10*3/uL — ABNORMAL HIGH (ref 4.0–10.5)

## 2016-03-17 LAB — COMPREHENSIVE METABOLIC PANEL
ALK PHOS: 99 U/L (ref 38–126)
ALT: 16 U/L (ref 14–54)
ANION GAP: 13 (ref 5–15)
AST: 16 U/L (ref 15–41)
Albumin: 4.6 g/dL (ref 3.5–5.0)
BILIRUBIN TOTAL: 0.5 mg/dL (ref 0.3–1.2)
BUN: 43 mg/dL — ABNORMAL HIGH (ref 6–20)
CO2: 19 mmol/L — ABNORMAL LOW (ref 22–32)
Calcium: 9.6 mg/dL (ref 8.9–10.3)
Chloride: 106 mmol/L (ref 101–111)
Creatinine, Ser: 2.22 mg/dL — ABNORMAL HIGH (ref 0.44–1.00)
GFR calc Af Amer: 24 mL/min — ABNORMAL LOW (ref >60)
GFR, EST NON AFRICAN AMERICAN: 21 mL/min — AB (ref >60)
Glucose, Bld: 167 mg/dL — ABNORMAL HIGH (ref 65–99)
Potassium: 4.5 mmol/L (ref 3.5–5.1)
Sodium: 138 mmol/L (ref 135–145)
Total Protein: 8.1 g/dL (ref 6.5–8.1)

## 2016-03-17 LAB — URINALYSIS, ROUTINE W REFLEX MICROSCOPIC
Bilirubin Urine: NEGATIVE
Glucose, UA: NEGATIVE mg/dL
HGB URINE DIPSTICK: NEGATIVE
Ketones, ur: NEGATIVE mg/dL
Nitrite: NEGATIVE
PROTEIN: NEGATIVE mg/dL
Specific Gravity, Urine: 1.01 (ref 1.005–1.030)
pH: 5 (ref 5.0–8.0)

## 2016-03-17 LAB — URINE MICROSCOPIC-ADD ON: RBC / HPF: NONE SEEN RBC/hpf (ref 0–5)

## 2016-03-17 LAB — I-STAT TROPONIN, ED: Troponin i, poc: 0 ng/mL (ref 0.00–0.08)

## 2016-03-17 LAB — LIPASE, BLOOD: LIPASE: 28 U/L (ref 11–51)

## 2016-03-17 MED ORDER — GI COCKTAIL ~~LOC~~
30.0000 mL | Freq: Once | ORAL | Status: AC
  Administered 2016-03-17: 30 mL via ORAL
  Filled 2016-03-17: qty 30

## 2016-03-17 MED ORDER — PANTOPRAZOLE SODIUM 40 MG PO TBEC: 40.0000 mg | DELAYED_RELEASE_TABLET | Freq: Every day | ORAL | Status: DC

## 2016-03-17 MED ORDER — PANTOPRAZOLE SODIUM 40 MG IV SOLR
40.0000 mg | Freq: Once | INTRAVENOUS | Status: AC
  Administered 2016-03-17: 40 mg via INTRAVENOUS
  Filled 2016-03-17: qty 40

## 2016-03-17 MED ORDER — FENTANYL CITRATE (PF) 100 MCG/2ML IJ SOLN
50.0000 ug | Freq: Once | INTRAMUSCULAR | Status: AC
  Administered 2016-03-17: 50 ug via INTRAVENOUS
  Filled 2016-03-17: qty 2

## 2016-03-17 MED ORDER — DIATRIZOATE MEGLUMINE & SODIUM 66-10 % PO SOLN
30.0000 mL | Freq: Once | ORAL | Status: AC
  Administered 2016-03-17: 30 mL via ORAL
  Filled 2016-03-17: qty 30

## 2016-03-17 MED ORDER — ONDANSETRON 4 MG PO TBDP: 4.0000 mg | ORAL_TABLET | Freq: Three times a day (TID) | ORAL | Status: DC | PRN

## 2016-03-17 MED ORDER — SUCRALFATE 1 GM/10ML PO SUSP: 1.0000 g | Freq: Three times a day (TID) | ORAL | Status: DC

## 2016-03-17 NOTE — Discharge Instructions (Signed)
Please follow-up with your primary care physician in one week to have your kidney function rechecked. Your creatinine today was 2.22 and was recently 1.40. Please avoid NSAIDs such as aspirin, Aleve, ibuprofen, Motrin, Advil, Goody powders. It is okay to take Tylenol 1000 g every 6 hours as needed for pain. Please avoid alcohol. I suspect that your abdominal pain is from inflammation of your stomach called gastritis or acid reflux. We are discharging you with medications to start taking for this. Her pain continues, please follow-up with your PCP or a gastroenterologist. If your pain significantly worsens, you have chest pain or chest discomfort, vomiting and cannot stop, black stools, blood in your stool, please return to the hospital.   Acute Kidney Injury Acute kidney injury is any condition in which there is sudden (acute) damage to the kidneys. Acute kidney injury was previously known as acute kidney failure or acute renal failure. The kidneys are two organs that lie on either side of the spine between the middle of the back and the front of the abdomen. The kidneys:  Remove wastes and extra water from the blood.   Produce important hormones. These help keep bones strong, regulate blood pressure, and help create red blood cells.   Balance the fluids and chemicals in the blood and tissues. A small amount of kidney damage may not cause problems, but a large amount of damage may make it difficult or impossible for the kidneys to work the way they should. Acute kidney injury may develop into long-lasting (chronic) kidney disease. It may also develop into a life-threatening disease called end-stage kidney disease. Acute kidney injury can get worse very quickly, so it should be treated right away. Early treatment may prevent other kidney diseases from developing. CAUSES   A problem with blood flow to the kidneys. This may be caused by:   Blood loss.   Heart disease.   Severe burns.   Liver  disease.  Direct damage to the kidneys. This may be caused by:  Some medicines.   A kidney infection.   Poisoning or consuming toxic substances.   A surgical wound.   A blow to the kidney area.   A problem with urine flow. This may be caused by:   Cancer.   Kidney stones.   An enlarged prostate. SIGNS AND SYMPTOMS   Swelling (edema) of the legs, ankles, or feet.   Tiredness (lethargy).   Nausea or vomiting.   Confusion.   Problems with urination, such as:   Painful or burning feeling during urination.   Decreased urine production.   Frequent accidents in children who are potty trained.   Bloody urine.   Muscle twitches and cramps.   Shortness of breath.   Seizures.   Chest pain or pressure. Sometimes, no symptoms are present. DIAGNOSIS Acute kidney injury may be detected and diagnosed by tests, including blood, urine, imaging, or kidney biopsy tests.  TREATMENT Treatment of acute kidney injury varies depending on the cause and severity of the kidney damage. In mild cases, no treatment may be needed. The kidneys may heal on their own. If acute kidney injury is more severe, your health care provider will treat the cause of the kidney damage, help the kidneys heal, and prevent complications from occurring. Severe cases may require a procedure to remove toxic wastes from the body (dialysis) or surgery to repair kidney damage. Surgery may involve:   Repair of a torn kidney.   Removal of an obstruction. HOME CARE INSTRUCTIONS  Follow your prescribed diet.  Take medicines only as directed by your health care provider.  Do not take any new medicines (prescription, over-the-counter, or nutritional supplements) unless approved by your health care provider. Many medicines can worsen your kidney damage or may need to have the dose adjusted.   Keep all follow-up visits as directed by your health care provider. This is important.  Observe  your condition to make sure you are healing as expected. SEEK IMMEDIATE MEDICAL CARE IF:  You are feeling ill or have severe pain in the back or side.   Your symptoms return or you have new symptoms.  You have any symptoms of end-stage kidney disease. These include:   Persistent itchiness.   Loss of appetite.   Headaches.   Abnormally dark or light skin.  Numbness in the hands or feet.   Easy bruising.   Frequent hiccups.   Menstruation stops.   You have a fever.  You have increased urine production.  You have pain or bleeding when urinating. MAKE SURE YOU:   Understand these instructions.  Will watch your condition.  Will get help right away if you are not doing well or get worse.   This information is not intended to replace advice given to you by your health care provider. Make sure you discuss any questions you have with your health care provider.   Document Released: 06/04/2011 Document Revised: 12/10/2014 Document Reviewed: 07/18/2012 Elsevier Interactive Patient Education 2016 Elsevier Inc[employer]     Gastritis, Adult Gastritis is soreness and swelling (inflammation) of the lining of the stomach. Gastritis can develop as a sudden onset (acute) or long-term (chronic) condition. If gastritis is not treated, it can lead to stomach bleeding and ulcers. CAUSES  Gastritis occurs when the stomach lining is weak or damaged. Digestive juices from the stomach then inflame the weakened stomach lining. The stomach lining may be weak or damaged due to viral or bacterial infections. One common bacterial infection is the Helicobacter pylori infection. Gastritis can also result from excessive alcohol consumption, taking certain medicines, or having too much acid in the stomach.  SYMPTOMS  In some cases, there are no symptoms. When symptoms are present, they may include:  Pain or a burning sensation in the upper abdomen.  Nausea.  Vomiting.  An uncomfortable  feeling of fullness after eating. DIAGNOSIS  Your caregiver may suspect you have gastritis based on your symptoms and a physical exam. To determine the cause of your gastritis, your caregiver may perform the following:  Blood or stool tests to check for the H pylori bacterium.  Gastroscopy. A thin, flexible tube (endoscope) is passed down the esophagus and into the stomach. The endoscope has a light and camera on the end. Your caregiver uses the endoscope to view the inside of the stomach.  Taking a tissue sample (biopsy) from the stomach to examine under a microscope. TREATMENT  Depending on the cause of your gastritis, medicines may be prescribed. If you have a bacterial infection, such as an H pylori infection, antibiotics may be given. If your gastritis is caused by too much acid in the stomach, H2 blockers or antacids may be given. Your caregiver may recommend that you stop taking aspirin, ibuprofen, or other nonsteroidal anti-inflammatory drugs (NSAIDs). HOME CARE INSTRUCTIONS  Only take over-the-counter or prescription medicines as directed by your caregiver.  If you were given antibiotic medicines, take them as directed. Finish them even if you start to feel better.  Drink enough fluids to keep  your urine clear or pale yellow.  Avoid foods and drinks that make your symptoms worse, such as:  Caffeine or alcoholic drinks.  Chocolate.  Peppermint or mint flavorings.  Garlic and onions.  Spicy foods.  Citrus fruits, such as oranges, lemons, or limes.  Tomato-based foods such as sauce, chili, salsa, and pizza.  Fried and fatty foods.  Eat small, frequent meals instead of large meals. SEEK IMMEDIATE MEDICAL CARE IF:   You have black or dark red stools.  You vomit blood or material that looks like coffee grounds.  You are unable to keep fluids down.  Your abdominal pain gets worse.  You have a fever.  You do not feel better after 1 week.  You have any other  questions or concerns. MAKE SURE YOU:  Understand these instructions.  Will watch your condition.  Will get help right away if you are not doing well or get worse.   This information is not intended to replace advice given to you by your health care provider. Make sure you discuss any questions you have with your health care provider.   Document Released: 11/13/2001 Document Revised: 05/20/2012 Document Reviewed: 01/02/2012 Elsevier Interactive Patient Education 2016 [place] for Gastroesophageal Reflux Disease, Adult When you have gastroesophageal reflux disease (GERD), the foods you eat and your eating habits are very important. Choosing the right foods can help ease the discomfort of GERD. WHAT GENERAL GUIDELINES DO I NEED TO FOLLOW?  Choose fruits, vegetables, whole grains, low-fat dairy products, and low-fat meat, fish, and poultry.  Limit fats such as oils, salad dressings, butter, nuts, and avocado.  Keep a food diary to identify foods that cause symptoms.  Avoid foods that cause reflux. These may be different for different people.  Eat frequent small meals instead of three large meals each day.  Eat your meals slowly, in a relaxed setting.  Limit fried foods.  Cook foods using methods other than frying.  Avoid drinking alcohol.  Avoid drinking large amounts of liquids with your meals.  Avoid bending over or lying down until 2-3 hours after eating. WHAT FOODS ARE NOT RECOMMENDED? The following are some foods and drinks that may worsen your symptoms: Vegetables Tomatoes. Tomato juice. Tomato and spaghetti sauce. Chili peppers. Onion and garlic. Horseradish. Fruits Oranges, grapefruit, and lemon (fruit and juice). Meats High-fat meats, fish, and poultry. This includes hot dogs, ribs, ham, sausage, salami, and bacon. Dairy Whole milk and chocolate milk. Sour cream. Cream. Butter. Ice cream. Cream cheese.  Beverages Coffee and tea, with or  without caffeine. Carbonated beverages or energy drinks. Condiments Hot sauce. Barbecue sauce.  Sweets/Desserts Chocolate and cocoa. Donuts. Peppermint and spearmint. Fats and Oils High-fat foods, including [place] fries and potato chips. Other Vinegar. Strong spices, such as black pepper, white pepper, red pepper, cayenne, curry powder, cloves, ginger, and chili powder. The items listed above may not be a complete list of foods and beverages to avoid. Contact your dietitian for more information.   This information is not intended to replace advice given to you by your health care provider. Make sure you discuss any questions you have with your health care provider.   Document Released: 11/19/2005 Document Revised: 12/10/2014 Document Reviewed: 09/23/2013 Elsevier Interactive Patient Education [employer].

## 2016-03-20 ENCOUNTER — Observation Stay (HOSPITAL_COMMUNITY)
Admission: EM | Admit: 2016-03-20 | Discharge: 2016-03-24 | Disposition: A | Discharge status: Home / Self Care | Payer: Medicare Other | Attending: Internal Medicine | Admitting: Internal Medicine

## 2016-03-20 DIAGNOSIS — F419 Anxiety disorder, unspecified: Secondary | ICD-10-CM | POA: Diagnosis not present

## 2016-03-20 DIAGNOSIS — K859 Acute pancreatitis without necrosis or infection, unspecified: Secondary | ICD-10-CM | POA: Diagnosis present

## 2016-03-20 DIAGNOSIS — I10 Essential (primary) hypertension: Secondary | ICD-10-CM | POA: Insufficient documentation

## 2016-03-20 DIAGNOSIS — K858 Other acute pancreatitis without necrosis or infection: Secondary | ICD-10-CM | POA: Diagnosis not present

## 2016-03-20 DIAGNOSIS — R1013 Epigastric pain: Secondary | ICD-10-CM | POA: Diagnosis not present

## 2016-03-20 DIAGNOSIS — N179 Acute kidney failure, unspecified: Secondary | ICD-10-CM | POA: Diagnosis not present

## 2016-03-20 DIAGNOSIS — Z79899 Other long term (current) drug therapy: Secondary | ICD-10-CM | POA: Diagnosis not present

## 2016-03-20 DIAGNOSIS — R109 Unspecified abdominal pain: Secondary | ICD-10-CM | POA: Diagnosis present

## 2016-03-20 DIAGNOSIS — F329 Major depressive disorder, single episode, unspecified: Secondary | ICD-10-CM | POA: Insufficient documentation

## 2016-03-20 DIAGNOSIS — F32A Depression, unspecified: Secondary | ICD-10-CM | POA: Diagnosis present

## 2016-03-21 ENCOUNTER — Encounter (HOSPITAL_COMMUNITY): Payer: Self-pay | Admitting: *Deleted

## 2016-03-21 DIAGNOSIS — I10 Essential (primary) hypertension: Secondary | ICD-10-CM | POA: Diagnosis present

## 2016-03-21 DIAGNOSIS — F32A Depression, unspecified: Secondary | ICD-10-CM | POA: Diagnosis present

## 2016-03-21 DIAGNOSIS — K859 Acute pancreatitis without necrosis or infection, unspecified: Secondary | ICD-10-CM | POA: Diagnosis present

## 2016-03-21 DIAGNOSIS — F329 Major depressive disorder, single episode, unspecified: Secondary | ICD-10-CM | POA: Diagnosis present

## 2016-03-21 DIAGNOSIS — N179 Acute kidney failure, unspecified: Secondary | ICD-10-CM | POA: Diagnosis not present

## 2016-03-21 DIAGNOSIS — F419 Anxiety disorder, unspecified: Secondary | ICD-10-CM | POA: Diagnosis not present

## 2016-03-21 DIAGNOSIS — K858 Other acute pancreatitis without necrosis or infection: Secondary | ICD-10-CM | POA: Diagnosis not present

## 2016-03-21 LAB — COMPREHENSIVE METABOLIC PANEL
ALBUMIN: 4.6 g/dL (ref 3.5–5.0)
ALT: 17 U/L (ref 14–54)
AST: 18 U/L (ref 15–41)
Alkaline Phosphatase: 91 U/L (ref 38–126)
Anion gap: 12 (ref 5–15)
BUN: 41 mg/dL — AB (ref 6–20)
CHLORIDE: 108 mmol/L (ref 101–111)
CO2: 17 mmol/L — ABNORMAL LOW (ref 22–32)
CREATININE: 2.24 mg/dL — AB (ref 0.44–1.00)
Calcium: 9.7 mg/dL (ref 8.9–10.3)
GFR calc Af Amer: 24 mL/min — ABNORMAL LOW (ref >60)
GFR calc non Af Amer: 21 mL/min — ABNORMAL LOW (ref >60)
GLUCOSE: 139 mg/dL — AB (ref 65–99)
POTASSIUM: 4.8 mmol/L (ref 3.5–5.1)
Sodium: 137 mmol/L (ref 135–145)
Total Bilirubin: 0.3 mg/dL (ref 0.3–1.2)
Total Protein: 8.2 g/dL — ABNORMAL HIGH (ref 6.5–8.1)

## 2016-03-21 LAB — CBC
HEMATOCRIT: 38.9 % (ref 36.0–46.0)
Hemoglobin: 13.2 g/dL (ref 12.0–15.0)
MCH: 30.6 pg (ref 26.0–34.0)
MCHC: 33.9 g/dL (ref 30.0–36.0)
MCV: 90.3 fL (ref 78.0–100.0)
PLATELETS: 395 10*3/uL (ref 150–400)
RBC: 4.31 MIL/uL (ref 3.87–5.11)
RDW: 12.5 % (ref 11.5–15.5)
WBC: 15.4 10*3/uL — ABNORMAL HIGH (ref 4.0–10.5)

## 2016-03-21 LAB — URINE MICROSCOPIC-ADD ON: RBC / HPF: NONE SEEN RBC/hpf (ref 0–5)

## 2016-03-21 LAB — TROPONIN I

## 2016-03-21 LAB — URINALYSIS, ROUTINE W REFLEX MICROSCOPIC
BILIRUBIN URINE: NEGATIVE
GLUCOSE, UA: NEGATIVE mg/dL
HGB URINE DIPSTICK: NEGATIVE
KETONES UR: NEGATIVE mg/dL
Nitrite: POSITIVE — AB
PH: 5 (ref 5.0–8.0)
Protein, ur: NEGATIVE mg/dL
Specific Gravity, Urine: 1.018 (ref 1.005–1.030)

## 2016-03-21 LAB — LACTIC ACID, PLASMA
LACTIC ACID, VENOUS: 0.8 mmol/L (ref 0.5–2.0)
Lactic Acid, Venous: 0.7 mmol/L (ref 0.5–2.0)

## 2016-03-21 LAB — I-STAT TROPONIN, ED: TROPONIN I, POC: 0 ng/mL (ref 0.00–0.08)

## 2016-03-21 LAB — LIPASE, BLOOD: LIPASE: 93 U/L — AB (ref 11–51)

## 2016-03-21 MED ORDER — PANTOPRAZOLE SODIUM 40 MG PO TBEC
40.0000 mg | DELAYED_RELEASE_TABLET | Freq: Every day | ORAL | Status: DC
  Administered 2016-03-21 – 2016-03-24 (×4): 40 mg via ORAL
  Filled 2016-03-21 (×4): qty 1

## 2016-03-21 MED ORDER — POLYETHYLENE GLYCOL 3350 17 G PO PACK: 17.0000 g | PACK | Freq: Every day | ORAL | Status: DC | PRN

## 2016-03-21 MED ORDER — CETYLPYRIDINIUM CHLORIDE 0.05 % MT LIQD
7.0000 mL | Freq: Two times a day (BID) | OROMUCOSAL | Status: DC
  Administered 2016-03-21 – 2016-03-24 (×6): 7 mL via OROMUCOSAL

## 2016-03-21 MED ORDER — ONDANSETRON HCL 4 MG PO TABS
4.0000 mg | ORAL_TABLET | Freq: Four times a day (QID) | ORAL | Status: DC | PRN
  Administered 2016-03-21: 4 mg via ORAL
  Filled 2016-03-21: qty 1

## 2016-03-21 MED ORDER — HYDROMORPHONE HCL 1 MG/ML IJ SOLN
0.5000 mg | Freq: Once | INTRAMUSCULAR | Status: AC
  Administered 2016-03-21: 0.5 mg via INTRAVENOUS
  Filled 2016-03-21: qty 1

## 2016-03-21 MED ORDER — SPIRONOLACTONE 25 MG PO TABS
50.0000 mg | ORAL_TABLET | Freq: Every day | ORAL | Status: DC
  Administered 2016-03-21: 50 mg via ORAL
  Filled 2016-03-21: qty 2

## 2016-03-21 MED ORDER — DEXTROSE 5 % IV SOLN
1.0000 g | Freq: Once | INTRAVENOUS | Status: AC
  Administered 2016-03-21: 1 g via INTRAVENOUS
  Filled 2016-03-21: qty 10

## 2016-03-21 MED ORDER — GI COCKTAIL ~~LOC~~
30.0000 mL | Freq: Once | ORAL | Status: AC
  Administered 2016-03-21: 30 mL via ORAL
  Filled 2016-03-21: qty 30

## 2016-03-21 MED ORDER — FENTANYL CITRATE (PF) 100 MCG/2ML IJ SOLN
50.0000 ug | Freq: Once | INTRAMUSCULAR | Status: AC
  Administered 2016-03-21: 50 ug via NASAL
  Filled 2016-03-21: qty 2

## 2016-03-21 MED ORDER — SUCRALFATE 1 GM/10ML PO SUSP: 1.0000 g | Freq: Three times a day (TID) | ORAL | Status: DC | PRN

## 2016-03-21 MED ORDER — HYDROMORPHONE HCL 1 MG/ML IJ SOLN: 0.5000 mg | INTRAMUSCULAR | Status: DC | PRN

## 2016-03-21 MED ORDER — SODIUM CHLORIDE 0.9 % IV SOLN
INTRAVENOUS | Status: AC
  Administered 2016-03-21: 07:00:00 via INTRAVENOUS

## 2016-03-21 MED ORDER — IMIPRAMINE HCL 50 MG PO TABS
50.0000 mg | ORAL_TABLET | Freq: Every day | ORAL | Status: DC
  Administered 2016-03-21 – 2016-03-23 (×3): 50 mg via ORAL
  Filled 2016-03-21 (×5): qty 1

## 2016-03-21 MED ORDER — CLORAZEPATE DIPOTASSIUM 3.75 MG PO TABS
3.7500 mg | ORAL_TABLET | Freq: Every day | ORAL | Status: DC
  Administered 2016-03-21 – 2016-03-23 (×3): 3.75 mg via ORAL
  Filled 2016-03-21 (×5): qty 1

## 2016-03-21 MED ORDER — SODIUM CHLORIDE 0.9 % IV BOLUS (SEPSIS)
500.0000 mL | Freq: Once | INTRAVENOUS | Status: AC
Start: 2016-03-21 — End: 2016-03-21
  Administered 2016-03-21: 500 mL via INTRAVENOUS

## 2016-03-21 MED ORDER — SODIUM CHLORIDE 0.9 % IV SOLN
INTRAVENOUS | Status: DC
  Administered 2016-03-21 (×2): via INTRAVENOUS
  Administered 2016-03-21 – 2016-03-23 (×3): 1000 mL via INTRAVENOUS
  Administered 2016-03-23: 14:00:00 via INTRAVENOUS

## 2016-03-21 MED ORDER — ONDANSETRON HCL 4 MG/2ML IJ SOLN: 4.0000 mg | Freq: Four times a day (QID) | INTRAMUSCULAR | Status: DC | PRN

## 2016-03-21 MED ORDER — ONDANSETRON HCL 4 MG/2ML IJ SOLN
4.0000 mg | Freq: Once | INTRAMUSCULAR | Status: AC
  Administered 2016-03-21: 4 mg via INTRAVENOUS
  Filled 2016-03-21: qty 2

## 2016-03-21 MED ORDER — ENOXAPARIN SODIUM 30 MG/0.3ML ~~LOC~~ SOLN
30.0000 mg | SUBCUTANEOUS | Status: DC
  Administered 2016-03-21 – 2016-03-23 (×3): 30 mg via SUBCUTANEOUS
  Filled 2016-03-21 (×3): qty 0.3

## 2016-03-21 MED ORDER — DEXTROSE 5 % IV SOLN
1.0000 g | INTRAVENOUS | Status: DC
  Administered 2016-03-22 – 2016-03-24 (×3): 1 g via INTRAVENOUS
  Filled 2016-03-21 (×3): qty 10

## 2016-03-21 MED ORDER — ONDANSETRON 4 MG PO TBDP: 4.0000 mg | ORAL_TABLET | Freq: Three times a day (TID) | ORAL | Status: DC | PRN

## 2016-03-21 MED ORDER — DILTIAZEM HCL ER COATED BEADS 120 MG PO CP24
240.0000 mg | ORAL_CAPSULE | Freq: Every day | ORAL | Status: DC
  Administered 2016-03-21 – 2016-03-23 (×3): 240 mg via ORAL
  Filled 2016-03-21 (×4): qty 2

## 2016-03-21 NOTE — Progress Notes (Signed)
Pharmacy Antibiotic Note  Judy [name] is a 75 y.o. female admitted on 03/20/2016 with pancreatitis and possible UTI.  Pharmacy has been consulted for ceftriaxone dosing.  Urine culture in process.   Plan: Continue ceftriaxone 1g IV q24h Dosage remains stable and need for further dosage adjustment appears unlikely at present.    Will sign off at this time.  Please reconsult if a change in clinical status warrants re-evaluation of dosage.     Height: 5' (152.4 cm) Weight: 177 lb (80.287 kg) IBW/kg (Calculated) : 45.5  Temp (24hrs), Avg:97.8 F (36.6 C), Min:97.7 F (36.5 C), Max:97.9 F (36.6 C)   Recent Labs Lab 03/16/16 2347 03/21/16 0025 03/21/16 0533 03/21/16 0858  WBC 17.4* 15.4*  --   --   CREATININE 2.22* 2.24*  --   --   LATICACIDVEN  --   --  0.7 0.8    Estimated Creatinine Clearance: 21 mL/min (by C-G formula based on Cr of 2.24).    No Known Allergies  Antimicrobials this admission: 4/19 Ceftriaxone >>   Dose adjustments this admission:   Microbiology results: 4/19 UCx: IP   Thank you for allowing pharmacy to be a part of this patient's care.  Ralene Bathe, PharmD, BCPS 03/21/2016, 10:16 AM  Pager: [phone]

## 2016-03-21 NOTE — ED Notes (Signed)
Pt. Made aware for the need of urine. 

## 2016-03-21 NOTE — ED Provider Notes (Addendum)
Medical screening examination/treatment/procedure(s) were conducted as a shared visit with non-physician practitioner(s) and myself.  I personally evaluated the patient during the encounter.   EKG Interpretation   Date/Time:  Wednesday March 21 2016 05:12:52 EDT Ventricular Rate:  101 PR Interval:  179 QRS Duration: 91 QT Interval:  342 QTC Calculation: 443 R Axis:   93 Text Interpretation:  Sinus tachycardia Right axis deviation Low voltage,  precordial leads ST elevation, consider inferior injury ST changes not  seen previously Confirmed by Aveena Bari  MD, Jenny Reichmann ([id number]) on 03/21/2016 5:21:19  AM      Abdomen soft with epigastric tenderness. Equivocal ST elevations are seen in the inferior leads but troponin is negative. Lipase is elevated compared to previous visit suggesting acute pancreatitis.    Shanon Rosser, MD 03/21/16 [place], MD 04/05/16 [phone]

## 2016-03-21 NOTE — H&P (Signed)
History and Physical    Judy [name] [id number] DOB: 1941/05/09 DOA: 03/20/2016  Referring MD/NP/PA: Shanon Rosser PCP: Zada Girt, MD Patient coming from: Home  Chief Complaint: Epigastric Pain  HPI: Judy [name] is a 75 y.o. female with medical history significant of HTN, anxiety and depression, possibly DM as she used to take metformin.  She presents for epigastric pain which started 8 days ago after eating hot dogs.  She thought initially that she had food poisoning as she had diarrhea and the pain.  However, the diarrhea resolved and the pain continued.  She notes the pain is stabbing like "someone stabbing her and twisting the knife" in the upper part of her abdomen.  It was 10/10 at its worst.  Only the pain medication here in the ED has made it better and she cannot tell if anything has made it worse.  She has had associated burping, but no nausea or vomiting.  She has not tried to eat in a few days, but has been able to take all of her pills.  Further associated symptoms include sweating but no fever and SOB with the pain.   Further symptoms include increased smell to the urine which has been present for a couple of weeks.  No suprapubic abdominal pain or dysuria.  She has a history of incontinence and wears pads for this.   ED Course: Initially seen in ED on 4/14 for similar pain, given protonix, sucralfate and zofran however, this did not touch the pain. Labs were checked and she was found to have elevated Cr to 2.24 (up from 1.4 in March).  For possible UTI she was given a dose of rocephin.  For pain she was given a dose of fentanyl and hydromorphone which did improve her pain.  She was also given zofran.  Lipase was noted to be elevated and ED called for admission for pancreatitis.   Review of Systems: As per HPI and noted no lightheadedness, dizziness, chest pain, regurgitation otherwise 10 point review of systems negative.    Past Medical History  Diagnosis Date  . Hypertension    . Anxiety   . Depression     Past Surgical History  Procedure Laterality Date  . Cholecystectomy      Reports that she has never smoked. She has never used smokeless tobacco. She reports that she does not drink alcohol or use illicit drugs.  No Known Allergies  Family History  Problem Relation Age of Onset  . Dementia Mother   . AAA (abdominal aortic aneurysm) Father    Reviewed with patient Prior to Admission medications   Medication Sig Start Date End Date Taking? Authorizing Provider  clorazepate (TRANXENE) 3.75 MG tablet Take 3.75 mg by mouth at bedtime.  03/14/16  Yes Historical Provider, MD  diltiazem (CARDIZEM CD) 240 MG 24 hr capsule Take 240 mg by mouth daily.  03/05/16  Yes Historical Provider, MD  imipramine (TOFRANIL) 50 MG tablet Take 50 mg by mouth at bedtime.   Yes Historical Provider, MD  lisinopril (PRINIVIL,ZESTRIL) 40 MG tablet Take 40 mg by mouth daily.  01/11/16  Yes Historical Provider, MD  ondansetron (ZOFRAN ODT) 4 MG disintegrating tablet Take 1 tablet (4 mg total) by mouth every 8 (eight) hours as needed for nausea or vomiting. 03/17/16  Yes Kristen N Ward, DO  pantoprazole (PROTONIX) 40 MG tablet Take 1 tablet (40 mg total) by mouth daily. 03/17/16  Yes Kristen N Ward, DO  spironolactone (ALDACTONE) 50 MG tablet  Take 50 mg by mouth daily.  03/05/16  Yes Historical Provider, MD  sucralfate (CARAFATE) 1 GM/10ML suspension Take 10 mLs (1 g total) by mouth 4 (four) times daily -  with meals and at bedtime. As needed 03/17/16  Yes Delice Bison Ward, DO    Physical Exam: Filed Vitals:   03/21/16 0518 03/21/16 0625 03/21/16 0728 03/21/16 0825  BP: 108/58 116/51 116/56 116/48  Pulse: 101 92 97 96  Temp:    97.8 F (36.6 C)  TempSrc:    Oral  Resp: 19 15 18 18   Height:      Weight:      SpO2: 94% 93% 97% 99%    Constitutional: NAD, calm, comfortable Filed Vitals:   03/21/16 0518 03/21/16 0625 03/21/16 0728 03/21/16 0825  BP: 108/58 116/51 116/56 116/48  Pulse:  101 92 97 96  Temp:    97.8 F (36.6 C)  TempSrc:    Oral  Resp: 19 15 18 18   Height:      Weight:      SpO2: 94% 93% 97% 99%   Eyes: EOMI, anicteric sclerae Respiratory: clear to auscultation bilaterally, no wheezing, no crackles. Normal respiratory effort. No accessory muscle use.  Cardiovascular: Regular rate and rhythm, no murmurs / rubs / gallops. No extremity edema.  Abdomen: Mild tenderness in the epigastric region to palpation, no masses noted, no suprapubic tenderness, bowel sounds present though decreased in rate. No distention Musculoskeletal: no clubbing / cyanosis.  Normal muscle tone.  Skin: no rashes, lesions, ulcers. Neurologic: Grossly normal, no sensory deficits noted to light touch Psychiatric: Normal judgment and insight. Alert and oriented x 3. Normal mood.    Labs on Admission: I have personally reviewed following labs and imaging studies  CBC:  Recent Labs Lab 03/16/16 2347 03/21/16 0025  WBC 17.4* 15.4*  NEUTROABS 15.5*  --   HGB 13.3 13.2  HCT 39.9 38.9  MCV 91.9 90.3  PLT 345 [id number]   Basic Metabolic Panel:  Recent Labs Lab 03/16/16 2347 03/21/16 0025  NA 138 137  K 4.5 4.8  CL 106 108  CO2 19* 17*  GLUCOSE 167* 139*  BUN 43* 41*  CREATININE 2.22* 2.24*  CALCIUM 9.6 9.7   Liver Function Tests:  Recent Labs Lab 03/16/16 2347 03/21/16 0025  AST 16 18  ALT 16 17  ALKPHOS 99 91  BILITOT 0.5 0.3  PROT 8.1 8.2*  ALBUMIN 4.6 4.6    Recent Labs Lab 03/16/16 2347 03/21/16 0025  LIPASE 28 93*   Urine analysis:    Component Value Date/Time   COLORURINE YELLOW 03/21/2016 0145   APPEARANCEUR CLEAR 03/21/2016 0145   LABSPEC 1.018 03/21/2016 0145   PHURINE 5.0 03/21/2016 0145   GLUCOSEU NEGATIVE 03/21/2016 0145   HGBUR NEGATIVE 03/21/2016 0145   BILIRUBINUR NEGATIVE 03/21/2016 0145   KETONESUR NEGATIVE 03/21/2016 0145   PROTEINUR NEGATIVE 03/21/2016 0145   NITRITE POSITIVE* 03/21/2016 0145   LEUKOCYTESUR SMALL* 03/21/2016 0145      Radiological Exams on Admission: No results found.  EKG: Independently reviewed. Appears to be J point elevation vs. ST elevation in the inferior leads on new EKG.  Previous from 03/16/16 shows J point elevation in these leads, but it is more prominent today.  I stat   Assessment/Plan Pancreatitis - Lipase is elevated, she has improved with bowel rest and IV pain medication currently - Continue Bowel rest for now, NPO - IVF with NS at 125/hr for today, monitor for volume overload - Once  appetite returns, pain controlled, consider starting slowly with a diet - Pain management with dilaudid q 2hours - Bowel regimen with PRN miralax for now, if no BM, start senna/colace  HTN (hypertension) - BP on the low normal side in the ED today.  - Continue diltiazem with hold parameters for today - Given renal function, holding lisinopril and spironolactone for now.  If BP should elevated, would restart spironolactone first.  - Monitor renal function  AKI - Unclear baseline, was 1.4 in March of 2016, she does not endorse a history of CKD - IVF as noted above - Recheck Cr in the AM.  If renal function stable despite fluids, this is likely CKD - Holding lisinopril and spironolactone as above for now    Anxiety and Depression - Continue home meds of imipramine and clorazepate  EKG changes - She does have changes in the inferior leads, given the site of her pain could this be an inferior MI which is being masked?  - I stat troponin X 2 are 0 in the ED - Trend Troponin I  - EKG in the AM - Monitor for chest pain, she denies during exam  ? Diabetes - She reported that she used to be on metformin - Will check A1C, start SSI if sugars elevate  UTI - Given symptoms and + nitrites on exam, she was given rocephin in the ED.  - Rocephin X 3 days - UC pending  Diet: NPO    DVT prophylaxis: Lovenox Code Status: Full Family Communication: Patient alert and updated at bedside, no family at  bedside Disposition Plan: Pending improvement in medical issues, likely discharge in 1-2 days Consults called: None Admission status: Observation   Gilles Chiquito MD [facility] Pager [phone]  If 7PM-7AM, please contact night-coverage [email or web address] Password [facility]  03/21/2016, 9:41 AM

## 2016-03-21 NOTE — ED Notes (Signed)
Hospitalist at bedside 

## 2016-03-21 NOTE — ED Notes (Signed)
Pt states that she began having upper / mid abd pain around 4pm this afternoon and states "It has not let up"; pt denies N/V; pt c/o belching a lot; pt was seen on 4/14 for abd pain for same thing on 4/14; pt was diagnosed with Acute Renal Failure and Gastritis; pt states that she spoke with her MD who advised her to return if the pain continued

## 2016-03-21 NOTE — ED Notes (Signed)
Attempted report to 3W. RN unavailable to take report at this time.

## 2016-03-22 DIAGNOSIS — G4733 Obstructive sleep apnea (adult) (pediatric): Secondary | ICD-10-CM | POA: Diagnosis not present

## 2016-03-22 DIAGNOSIS — N179 Acute kidney failure, unspecified: Secondary | ICD-10-CM | POA: Diagnosis not present

## 2016-03-22 DIAGNOSIS — I10 Essential (primary) hypertension: Secondary | ICD-10-CM

## 2016-03-22 DIAGNOSIS — F419 Anxiety disorder, unspecified: Secondary | ICD-10-CM | POA: Diagnosis not present

## 2016-03-22 DIAGNOSIS — F329 Major depressive disorder, single episode, unspecified: Secondary | ICD-10-CM | POA: Diagnosis not present

## 2016-03-22 DIAGNOSIS — K858 Other acute pancreatitis without necrosis or infection: Secondary | ICD-10-CM

## 2016-03-22 LAB — COMPREHENSIVE METABOLIC PANEL
ALT: 16 U/L (ref 14–54)
ANION GAP: 8 (ref 5–15)
AST: 14 U/L — ABNORMAL LOW (ref 15–41)
Albumin: 3.2 g/dL — ABNORMAL LOW (ref 3.5–5.0)
Alkaline Phosphatase: 71 U/L (ref 38–126)
BUN: 31 mg/dL — ABNORMAL HIGH (ref 6–20)
CHLORIDE: 111 mmol/L (ref 101–111)
CO2: 21 mmol/L — AB (ref 22–32)
CREATININE: 1.78 mg/dL — AB (ref 0.44–1.00)
Calcium: 8.1 mg/dL — ABNORMAL LOW (ref 8.9–10.3)
GFR, EST AFRICAN AMERICAN: 31 mL/min — AB (ref >60)
GFR, EST NON AFRICAN AMERICAN: 27 mL/min — AB (ref >60)
Glucose, Bld: 97 mg/dL (ref 65–99)
POTASSIUM: 4.7 mmol/L (ref 3.5–5.1)
SODIUM: 140 mmol/L (ref 135–145)
Total Bilirubin: 0.2 mg/dL — ABNORMAL LOW (ref 0.3–1.2)
Total Protein: 5.9 g/dL — ABNORMAL LOW (ref 6.5–8.1)

## 2016-03-22 NOTE — Care Management Obs Status (Signed)
[place] NOTIFICATION   Patient Details  Name: Judy [name] MRN: [id number] Date of Birth: [birth year]   Medicare Observation Status Notification Given:  Yes    Lynnell Catalan, RN 03/22/2016, 3:29 PM

## 2016-03-22 NOTE — Progress Notes (Signed)
RT placed patient on CPAP. Patient setting is 5-20 cm H2O. Sterile water added to water chamber for humidification. Patient tolerating well at this time. RT will continue to monitor.

## 2016-03-22 NOTE — Progress Notes (Signed)
PROGRESS NOTE    Judy [name] LAST  [id number] DOB: 1941/07/13 DOA: 03/20/2016 PCP: No primary care provider on file. Outpatient Specialists:   Brief Narrative: Judy [name] is a 75 y.o. female with medical history significant of HTN, anxiety and depression, possibly DM as she used to take metformin. She presents for epigastric pain which started 8 days ago, some assoctaed diarrhea, now improved. No new meds, chronically has foul smelling urine and incontinence. ED Course: Initially seen in ED on 4/14 for similar pain, given protonix, sucralfate and zofran however, this did not touch the pain. Labs were checked and she was found to have elevated Cr to 2.24 (up from 1.4 in March). For possible UTI she was given a dose of rocephin.  Lipase was noted to be mildly elevated   Assessment & Plan:  Abd pain/etiology unclear -mild Pancreatitis could be possible, but lipase was only 93 -h/o lap chole many years ago, no ETOH, h/o colonoscopy : normal except polyps -Possible UTI, ? gastroenteritis all possibilities - CT abd 4/15: unremarkable - clinically improving - continue IVF, advance diet to clears then full liq today - cut down Iv narcotics, continue PPI - continue ceftriaxone, check urine Cx-unfortunately culture was not sent in ER  HTN (hypertension) - stable, Continue diltiazem  - ACE and spironolactone on hold  AKI on CKD 3 - was 1.4 in March of 2016, -improving, pre-renal and ACE use -cut down IVF   Anxiety and Depression - Continue home meds of imipramine and clorazepate  EKG changes - ST changes in inferior leads -troponin negative, no chest pain -will check ECHO  DVT prophylaxis: lovenox Code Status:Full Code Family Communication: no family at bedside Disposition Plan:home tomorrow if improved  Consultants:      Procedures:  Antimicrobials:ceftraixone  Subjective:  Objective: Filed Vitals:   03/21/16 1011 03/21/16 1354 03/21/16 2215 03/22/16 0636    BP:  91/46 100/46 103/45  Pulse:  86 72 69  Temp:  97.9 F (36.6 C) 98 F (36.7 C) 97.6 F (36.4 C)  TempSrc:  Oral Oral Oral  Resp:  16 16 16   Height: 5' (1.524 m)     Weight: 80.287 kg (177 lb)     SpO2:  95% 97% 97%    Intake/Output Summary (Last 24 hours) at 03/22/16 1146 Last data filed at 03/22/16 1042  Gross per 24 hour  Intake   2100 ml  Output      7 ml  Net   2093 ml   Filed Weights   03/20/16 2351 03/21/16 1011  Weight: 80.287 kg (177 lb) 80.287 kg (177 lb)    Examination:  General exam: Appears calm and comfortable  Respiratory system: Clear to auscultation. Respiratory effort normal. Cardiovascular system: S1 & S2 heard, RRR. No JVD, murmurs, rubs, gallops or clicks. No pedal edema. Gastrointestinal system: Abdomen is nondistended, soft and nontender. No organomegaly or masses felt. Normal bowel sounds heard. Central nervous system: Alert and oriented. No focal neurological deficits. Extremities: Symmetric 5 x 5 power. Skin: No rashes, lesions or ulcers Psychiatry: Judgement and insight appear normal. Mood & affect appropriate.     Data Reviewed: I have personally reviewed following labs and imaging studies  CBC:  Recent Labs Lab 03/16/16 2347 03/21/16 0025  WBC 17.4* 15.4*  NEUTROABS 15.5*  --   HGB 13.3 13.2  HCT 39.9 38.9  MCV 91.9 90.3  PLT 345 [id number]   Basic Metabolic Panel:  Recent Labs Lab 03/16/16 2347 03/21/16 0025 03/22/16 0403  NA 138 137 140  K 4.5 4.8 4.7  CL 106 108 111  CO2 19* 17* 21*  GLUCOSE 167* 139* 97  BUN 43* 41* 31*  CREATININE 2.22* 2.24* 1.78*  CALCIUM 9.6 9.7 8.1*   GFR: Estimated Creatinine Clearance: 26.4 mL/min (by C-G formula based on Cr of 1.78). Liver Function Tests:  Recent Labs Lab 03/16/16 2347 03/21/16 0025 03/22/16 0403  AST 16 18 14*  ALT 16 17 16   ALKPHOS 99 91 71  BILITOT 0.5 0.3 0.2*  PROT 8.1 8.2* 5.9*  ALBUMIN 4.6 4.6 3.2*    Recent Labs Lab 03/16/16 2347 03/21/16 0025   LIPASE 28 93*   No results for input(s): AMMONIA in the last 168 hours. Coagulation Profile: No results for input(s): INR, PROTIME in the last 168 hours. Cardiac Enzymes:  Recent Labs Lab 03/21/16 0900 03/21/16 1549  TROPONINI <0.03 <0.03   BNP (last 3 results) No results for input(s): PROBNP in the last 8760 hours. HbA1C: No results for input(s): HGBA1C in the last 72 hours. CBG: No results for input(s): GLUCAP in the last 168 hours. Lipid Profile: No results for input(s): CHOL, HDL, LDLCALC, TRIG, CHOLHDL, LDLDIRECT in the last 72 hours. Thyroid Function Tests: No results for input(s): TSH, T4TOTAL, FREET4, T3FREE, THYROIDAB in the last 72 hours. Anemia Panel: No results for input(s): VITAMINB12, FOLATE, FERRITIN, TIBC, IRON, RETICCTPCT in the last 72 hours. Urine analysis:    Component Value Date/Time   COLORURINE YELLOW 03/21/2016 0145   APPEARANCEUR CLEAR 03/21/2016 0145   LABSPEC 1.018 03/21/2016 0145   PHURINE 5.0 03/21/2016 0145   GLUCOSEU NEGATIVE 03/21/2016 0145   HGBUR NEGATIVE 03/21/2016 0145   BILIRUBINUR NEGATIVE 03/21/2016 0145   KETONESUR NEGATIVE 03/21/2016 0145   PROTEINUR NEGATIVE 03/21/2016 0145   NITRITE POSITIVE* 03/21/2016 0145   LEUKOCYTESUR SMALL* 03/21/2016 0145   Sepsis Labs: @LABRCNTIP (procalcitonin:4,lacticidven:4)  )No results found for this or any previous visit (from the past 240 hour(s)).       Radiology Studies: No results found.      Scheduled Meds: . antiseptic oral rinse  7 mL Mouth Rinse BID  . cefTRIAXone (ROCEPHIN)  IV  1 g Intravenous Q24H  . clorazepate  3.75 mg Oral QHS  . diltiazem  240 mg Oral Daily  . enoxaparin (LOVENOX) injection  30 mg Subcutaneous Q24H  . imipramine  50 mg Oral QHS  . pantoprazole  40 mg Oral Daily   Continuous Infusions: . sodium chloride 100 mL/hr at 03/22/16 [id number]        Time spent: 62min    Domenic Polite, MD [facility] Pager [phone]  If 7PM-7AM, please  contact night-coverage [email or web address] Password [facility] 03/22/2016, 11:46 AM

## 2016-03-23 ENCOUNTER — Observation Stay (HOSPITAL_BASED_OUTPATIENT_CLINIC_OR_DEPARTMENT_OTHER): Payer: Medicare Other

## 2016-03-23 DIAGNOSIS — I10 Essential (primary) hypertension: Secondary | ICD-10-CM | POA: Diagnosis not present

## 2016-03-23 DIAGNOSIS — N179 Acute kidney failure, unspecified: Secondary | ICD-10-CM | POA: Diagnosis not present

## 2016-03-23 DIAGNOSIS — F419 Anxiety disorder, unspecified: Secondary | ICD-10-CM

## 2016-03-23 DIAGNOSIS — R0600 Dyspnea, unspecified: Secondary | ICD-10-CM

## 2016-03-23 DIAGNOSIS — K858 Other acute pancreatitis without necrosis or infection: Secondary | ICD-10-CM | POA: Diagnosis not present

## 2016-03-23 LAB — URINE CULTURE

## 2016-03-23 LAB — LIPASE, BLOOD: Lipase: 102 U/L — ABNORMAL HIGH (ref 11–51)

## 2016-03-23 LAB — BASIC METABOLIC PANEL
ANION GAP: 9 (ref 5–15)
BUN: 22 mg/dL — ABNORMAL HIGH (ref 6–20)
CALCIUM: 8.3 mg/dL — AB (ref 8.9–10.3)
CO2: 16 mmol/L — ABNORMAL LOW (ref 22–32)
CREATININE: 1.4 mg/dL — AB (ref 0.44–1.00)
Chloride: 111 mmol/L (ref 101–111)
GFR, EST AFRICAN AMERICAN: 42 mL/min — AB (ref >60)
GFR, EST NON AFRICAN AMERICAN: 36 mL/min — AB (ref >60)
GLUCOSE: 136 mg/dL — AB (ref 65–99)
Potassium: 4.5 mmol/L (ref 3.5–5.1)
Sodium: 136 mmol/L (ref 135–145)

## 2016-03-23 LAB — CBC
HCT: 31.3 % — ABNORMAL LOW (ref 36.0–46.0)
Hemoglobin: 10.5 g/dL — ABNORMAL LOW (ref 12.0–15.0)
MCH: 30.6 pg (ref 26.0–34.0)
MCHC: 33.5 g/dL (ref 30.0–36.0)
MCV: 91.3 fL (ref 78.0–100.0)
PLATELETS: 275 10*3/uL (ref 150–400)
RBC: 3.43 MIL/uL — ABNORMAL LOW (ref 3.87–5.11)
RDW: 12.7 % (ref 11.5–15.5)
WBC: 6.2 10*3/uL (ref 4.0–10.5)

## 2016-03-23 LAB — HEMOGLOBIN A1C
HEMOGLOBIN A1C: 6.2 % — AB (ref 4.8–5.6)
MEAN PLASMA GLUCOSE: 131 mg/dL

## 2016-03-23 LAB — ECHOCARDIOGRAM COMPLETE
HEIGHTINCHES: 60 in
WEIGHTICAEL: 2832 [oz_av]

## 2016-03-23 MED ORDER — ENOXAPARIN SODIUM 40 MG/0.4ML ~~LOC~~ SOLN: 40.0000 mg | SUBCUTANEOUS | Status: DC

## 2016-03-23 NOTE — Progress Notes (Signed)
Echocardiogram 2D Echocardiogram has been performed.  Tresa Res 03/23/2016, 12:34 PM

## 2016-03-23 NOTE — Progress Notes (Signed)
PROGRESS NOTE    Judy [name]  [id number] DOB: [birth year] DOA: 03/20/2016 PCP: No primary care provider on file. Outpatient Specialists:   Brief Narrative: Judy [name] is a 75 y.o. female with medical history significant of HTN, anxiety and depression, possibly DM as she used to take metformin. She presents for epigastric pain which started 8 days ago, some assoctaed diarrhea, now improved. No new meds, chronically has foul smelling urine and incontinence. ED Course: Initially seen in ED on 4/14 for similar pain, given protonix, sucralfate and zofran however, this did not touch the pain. Labs were checked and she was found to have elevated Cr to 2.24 (up from 1.4 in March). For possible UTI she was given a dose of rocephin.  Lipase was noted to be mildly elevated   Assessment & Plan:  Abd pain/etiology unclear -mild Pancreatitis could be possible, lipase was 93 and now 103 -h/o lap chole many years ago, no ETOH, h/o colonoscopy : normal except polyps -Possible UTI too-urine cx with GNR, continue ceftriaxone - CT abd 4/15: unremarkable - clinically improving - continue IVF, advanced diet to soft bland this am  GNR UTI -continue IVF Ceftriaxone, FU culture data  HTN (hypertension) - stable, Continue diltiazem  - ACE and spironolactone on hold  AKI on CKD 3 - was 1.4 in March of 2016, -improved, pre-renal and ACE use -cut down IVF   Anxiety and Depression - Continue home meds of imipramine and clorazepate  EKG changes - ST changes in inferior leads -troponin negative, no chest pain -FU ECHO  DVT prophylaxis: lovenox Code Status:Full Code Family Communication: no family at bedside Disposition Plan:home tomorrow if improved, Cx data pending  Consultants:      Procedures:  Antimicrobials:ceftraixone  Subjective:  Objective: Filed Vitals:   03/22/16 1422 03/22/16 2158 03/23/16 0459 03/23/16 1012  BP: 114/45 101/45 100/41 109/45  Pulse: 87 66 66 79   Temp: 98.5 F (36.9 C) 97.9 F (36.6 C) 98.3 F (36.8 C)   TempSrc: Oral Oral Oral   Resp: 16 17 17    Height:      Weight:      SpO2: 95% 95% 97%     Intake/Output Summary (Last 24 hours) at 03/23/16 1143 Last data filed at 03/23/16 0853  Gross per 24 hour  Intake   1930 ml  Output      4 ml  Net   1926 ml   Filed Weights   03/20/16 2351 03/21/16 1011  Weight: 80.287 kg (177 lb) 80.287 kg (177 lb)    Examination:  General exam: Appears calm and comfortable  Respiratory system: Clear to auscultation. Respiratory effort normal. Cardiovascular system: S1 & S2 heard, RRR. No JVD, murmurs, rubs, gallops or clicks. No pedal edema. Gastrointestinal system: Abdomen is nondistended, soft and nontender. No organomegaly or masses felt. Normal bowel sounds heard. Central nervous system: Alert and oriented. No focal neurological deficits. Extremities: Symmetric 5 x 5 power. Skin: No rashes, lesions or ulcers Psychiatry: Judgement and insight appear normal. Mood & affect appropriate.     Data Reviewed: I have personally reviewed following labs and imaging studies  CBC:  Recent Labs Lab 03/16/16 2347 03/21/16 0025 03/23/16 0345  WBC 17.4* 15.4* 6.2  NEUTROABS 15.5*  --   --   HGB 13.3 13.2 10.5*  HCT 39.9 38.9 31.3*  MCV 91.9 90.3 91.3  PLT 345 395 [id number]   Basic Metabolic Panel:  Recent Labs Lab 03/16/16 2347 03/21/16 0025 03/22/16 0403 03/23/16 0345  NA 138 137 140 136  K 4.5 4.8 4.7 4.5  CL 106 108 111 111  CO2 19* 17* 21* 16*  GLUCOSE 167* 139* 97 136*  BUN 43* 41* 31* 22*  CREATININE 2.22* 2.24* 1.78* 1.40*  CALCIUM 9.6 9.7 8.1* 8.3*   GFR: Estimated Creatinine Clearance: 33.6 mL/min (by C-G formula based on Cr of 1.4). Liver Function Tests:  Recent Labs Lab 03/16/16 2347 03/21/16 0025 03/22/16 0403  AST 16 18 14*  ALT 16 17 16   ALKPHOS 99 91 71  BILITOT 0.5 0.3 0.2*  PROT 8.1 8.2* 5.9*  ALBUMIN 4.6 4.6 3.2*    Recent Labs Lab 03/16/16 2347  03/21/16 0025 03/23/16 0345  LIPASE 28 93* 102*   No results for input(s): AMMONIA in the last 168 hours. Coagulation Profile: No results for input(s): INR, PROTIME in the last 168 hours. Cardiac Enzymes:  Recent Labs Lab 03/21/16 0900 03/21/16 1549  TROPONINI <0.03 <0.03   BNP (last 3 results) No results for input(s): PROBNP in the last 8760 hours. HbA1C:  Recent Labs  03/22/16 0403  HGBA1C 6.2*   CBG: No results for input(s): GLUCAP in the last 168 hours. Lipid Profile: No results for input(s): CHOL, HDL, LDLCALC, TRIG, CHOLHDL, LDLDIRECT in the last 72 hours. Thyroid Function Tests: No results for input(s): TSH, T4TOTAL, FREET4, T3FREE, THYROIDAB in the last 72 hours. Anemia Panel: No results for input(s): VITAMINB12, FOLATE, FERRITIN, TIBC, IRON, RETICCTPCT in the last 72 hours. Urine analysis:    Component Value Date/Time   COLORURINE YELLOW 03/21/2016 0145   APPEARANCEUR CLEAR 03/21/2016 0145   LABSPEC 1.018 03/21/2016 0145   PHURINE 5.0 03/21/2016 0145   GLUCOSEU NEGATIVE 03/21/2016 0145   HGBUR NEGATIVE 03/21/2016 0145   BILIRUBINUR NEGATIVE 03/21/2016 0145   KETONESUR NEGATIVE 03/21/2016 0145   PROTEINUR NEGATIVE 03/21/2016 0145   NITRITE POSITIVE* 03/21/2016 0145   LEUKOCYTESUR SMALL* 03/21/2016 0145   Sepsis Labs: @LABRCNTIP (procalcitonin:4,lacticidven:4)  ) Recent Results (from the past 240 hour(s))  Culture, Urine     Status: Abnormal   Collection Time: 03/21/16  9:34 AM  Result Value Ref Range Status   Specimen Description URINE, RANDOM  Final   Special Requests NONE  Final   Culture >=100,000 COLONIES/mL KLEBSIELLA PNEUMONIAE (A)  Final   Report Status 03/23/2016 FINAL  Final   Organism ID, Bacteria KLEBSIELLA PNEUMONIAE (A)  Final      Susceptibility   Klebsiella pneumoniae - MIC*    AMPICILLIN 16 RESISTANT Resistant     CEFAZOLIN <=4 SENSITIVE Sensitive     CEFTRIAXONE <=1 SENSITIVE Sensitive     CIPROFLOXACIN <=0.25 SENSITIVE  Sensitive     GENTAMICIN <=1 SENSITIVE Sensitive     IMIPENEM <=0.25 SENSITIVE Sensitive     NITROFURANTOIN 64 INTERMEDIATE Intermediate     TRIMETH/SULFA <=20 SENSITIVE Sensitive     AMPICILLIN/SULBACTAM <=2 SENSITIVE Sensitive     PIP/TAZO <=4 SENSITIVE Sensitive     * >=100,000 COLONIES/mL KLEBSIELLA PNEUMONIAE         Radiology Studies: No results found.      Scheduled Meds: . antiseptic oral rinse  7 mL Mouth Rinse BID  . cefTRIAXone (ROCEPHIN)  IV  1 g Intravenous Q24H  . clorazepate  3.75 mg Oral QHS  . diltiazem  240 mg Oral Daily  . [START ON 03/24/2016] enoxaparin (LOVENOX) injection  40 mg Subcutaneous Q24H  . imipramine  50 mg Oral QHS  . pantoprazole  40 mg Oral Daily   Continuous Infusions: . sodium  chloride 1,000 mL (03/23/16 0202)        Time spent: 33min    Domenic Polite, MD [facility] Pager [phone]  If 7PM-7AM, please contact night-coverage [email or web address] Password [facility] 03/23/2016, 11:43 AM

## 2016-03-24 DIAGNOSIS — K858 Other acute pancreatitis without necrosis or infection: Secondary | ICD-10-CM | POA: Diagnosis not present

## 2016-03-24 DIAGNOSIS — N179 Acute kidney failure, unspecified: Secondary | ICD-10-CM | POA: Diagnosis not present

## 2016-03-24 DIAGNOSIS — I10 Essential (primary) hypertension: Secondary | ICD-10-CM | POA: Diagnosis not present

## 2016-03-24 LAB — COMPREHENSIVE METABOLIC PANEL
ALBUMIN: 3.3 g/dL — AB (ref 3.5–5.0)
ALT: 16 U/L (ref 14–54)
ANION GAP: 6 (ref 5–15)
AST: 13 U/L — ABNORMAL LOW (ref 15–41)
Alkaline Phosphatase: 81 U/L (ref 38–126)
BUN: 22 mg/dL — ABNORMAL HIGH (ref 6–20)
CHLORIDE: 112 mmol/L — AB (ref 101–111)
CO2: 22 mmol/L (ref 22–32)
Calcium: 8.6 mg/dL — ABNORMAL LOW (ref 8.9–10.3)
Creatinine, Ser: 1.39 mg/dL — ABNORMAL HIGH (ref 0.44–1.00)
GFR calc non Af Amer: 37 mL/min — ABNORMAL LOW (ref >60)
GFR, EST AFRICAN AMERICAN: 42 mL/min — AB (ref >60)
GLUCOSE: 94 mg/dL (ref 65–99)
POTASSIUM: 4.7 mmol/L (ref 3.5–5.1)
SODIUM: 140 mmol/L (ref 135–145)
Total Bilirubin: <0.1 mg/dL — ABNORMAL LOW (ref 0.3–1.2)
Total Protein: 5.9 g/dL — ABNORMAL LOW (ref 6.5–8.1)

## 2016-03-24 LAB — CBC
HCT: 29.5 % — ABNORMAL LOW (ref 36.0–46.0)
HEMOGLOBIN: 10.1 g/dL — AB (ref 12.0–15.0)
MCH: 31.2 pg (ref 26.0–34.0)
MCHC: 34.2 g/dL (ref 30.0–36.0)
MCV: 91 fL (ref 78.0–100.0)
PLATELETS: 279 10*3/uL (ref 150–400)
RBC: 3.24 MIL/uL — AB (ref 3.87–5.11)
RDW: 12.5 % (ref 11.5–15.5)
WBC: 7.5 10*3/uL (ref 4.0–10.5)

## 2016-03-24 MED ORDER — CIPROFLOXACIN HCL 250 MG PO TABS: 250.0000 mg | ORAL_TABLET | Freq: Two times a day (BID) | ORAL | Status: DC

## 2016-03-24 MED ORDER — UNABLE TO FIND: Status: DC

## 2016-03-24 NOTE — Progress Notes (Signed)
Nursing Discharge Summary  Patient ID: Judy [name] MRN: [id number] DOB/AGE: [birth year] 75 y.o.  Admit date: 03/20/2016 Discharge date: 03/24/2016  Discharged Condition: good  Disposition: 01-Home or Self Care  Follow-up Information    Follow up with GREEN, Keenan Bachelor, MD. Schedule an appointment as soon as possible for a visit in 1 week.   Specialty:  Internal Medicine   Contact information:   [address], [address] [place] [address] [phone]       Prescriptions Given: Patient given prescription for Cipro.  Patient follow up appointments and medications discussed with patient.  Patient verbalized understanding without further questions.    Means of Discharge: Patient to be taken downstairs via wheelchair to be discharged home via private vehicle.    Signed: Buel Ream 03/24/2016, 1:54 PM

## 2016-03-24 NOTE — Progress Notes (Signed)
RT placed patient on CPAP. Sterile water was added to water chamber for humidification. Patient is tolerating well at this time. RT will continue to monitor.

## 2016-03-24 NOTE — Discharge Summary (Signed)
Physician Discharge Summary  Judy [name] [id number] DOB: July 14, 1941 DOA: 03/20/2016  PCP: No primary care provider on file.  Admit date: 03/20/2016 Discharge date: 03/24/2016  Time spent: 45 minutes  Recommendations for Outpatient Follow-up:  1. PCP Dr.Green in 1 week, Lisinopril stopped due to AKi on CKD 2. IF has another episode of unexplained pancreatitis would refer to GI for MRCP/EUS   Discharge Diagnoses:  Principal Problem:   Pancreatitis   Klebsiella UTI   HTN (hypertension)   Anxiety   Depression   AKI (acute kidney injury) ([place])   Discharge Condition: stable  Diet recommendation: heart healthy  Filed Weights   03/20/16 2351 03/21/16 1011  Weight: 80.287 kg (177 lb) 80.287 kg (177 lb)    History of present illness:  Judy [name] is a 75 y.o. female with medical history significant of HTN, anxiety and depression, possibly DM as she used to take metformin. She presents for epigastric pain which started 8 days ago, some assoctaed diarrhea, now improved. No new meds, chronically has foul smelling urine and incontinence. ED Course: Initially seen in ED on 4/14 for similar pain, given protonix, sucralfate and zofran however, didn't help. Labs were checked and she was found to have elevated Cr to 2.24 (up from 1.4 in March). Found to have UTI and Lipase was noted to be mildly elevated   Hospital Course:  Abd pain/etiology unclear -mild Pancreatitis felt to be possible, lipase was 93 and now 103 -h/o lap chole many years ago, no ETOH, h/o colonoscopy : normal except polyps -Possible that UTI contributing - CT abd 4/15: unremarkable - clinically improved with bowel rest supportive care and Rx of UTI - tolerating soft diet at time of discharge  Klebsiella UTI -treated with IV Ceftriaxone, then changed to Cipro based on culture data  HTN (hypertension) - stable, Continue diltiazem  - ACE and spironolactone on hold intially -Aldactone resumed at  discharge  AKI on CKD 3 - was 1.4 in March of 2016, - creatinine was 2.2 on admission, hydrated, ACE/aldactone were held initially -creatinine  improved to 1.3 at discharge -ACE stopped and aldactone resumed at discharge   Anxiety and Depression - Continue home meds of imipramine and clorazepate  EKG changes - noted to have some ST changes in inferior leads on admission -troponin x3 negative, no chest pain -2D ECHO showed normal EF and wall motion  Discharge Exam: Filed Vitals:   03/23/16 2132 03/24/16 0618  BP: 108/44 130/53  Pulse: 71 76  Temp: 97.6 F (36.4 C) 97.9 F (36.6 C)  Resp: 17 17    General: AAOx3 Cardiovascular:S1S2/RRR Respiratory: CTAB  Discharge Instructions   Discharge Instructions    Diet - low sodium heart healthy    Complete by:  As directed      Increase activity slowly    Complete by:  As directed           Current Discharge Medication List    START taking these medications   Details  ciprofloxacin (CIPRO) 250 MG tablet Take 1 tablet (250 mg total) by mouth 2 (two) times daily. For 3days Qty: 6 tablet, Refills: 0    UNABLE TO FIND This note is to excuse Judy [name] from work due to medical illness requiring hospitalization, ok to return to work 4/28 Qty: 1 each, Refills: 0      CONTINUE these medications which have NOT CHANGED   Details  clorazepate (TRANXENE) 3.75 MG tablet Take 3.75 mg by mouth at bedtime.  diltiazem (CARDIZEM CD) 240 MG 24 hr capsule Take 240 mg by mouth daily.     imipramine (TOFRANIL) 50 MG tablet Take 50 mg by mouth at bedtime.    ondansetron (ZOFRAN ODT) 4 MG disintegrating tablet Take 1 tablet (4 mg total) by mouth every 8 (eight) hours as needed for nausea or vomiting. Qty: 20 tablet, Refills: 0    pantoprazole (PROTONIX) 40 MG tablet Take 1 tablet (40 mg total) by mouth daily. Qty: 30 tablet, Refills: 1    spironolactone (ALDACTONE) 50 MG tablet Take 50 mg by mouth daily.       STOP taking these  medications     lisinopril (PRINIVIL,ZESTRIL) 40 MG tablet      sucralfate (CARAFATE) 1 GM/10ML suspension        No Known Allergies Follow-up Information    Follow up with GREEN, EDWIN JAY, MD. Schedule an appointment as soon as possible for a visit in 1 week.   Specialty:  Internal Medicine   Contact information:   [address] [place] [address] [phone]        The results of significant diagnostics from this hospitalization (including imaging, microbiology, ancillary and laboratory) are listed below for reference.    Significant Diagnostic Studies: Ct Abdomen Pelvis Wo Contrast  03/17/2016  CLINICAL DATA:  Acute onset of mid abdominal pain and nausea. Leukocytosis. Initial encounter. EXAM: CT ABDOMEN AND PELVIS WITHOUT CONTRAST TECHNIQUE: Multidetector CT imaging of the abdomen and pelvis was performed following the standard protocol without IV contrast. COMPARISON:  MRI of the lumbar spine performed 03/18/2012 FINDINGS: Mild interstitial prominence is noted at the lung bases, raising question for minimal interstitial edema. The liver and spleen are unremarkable in appearance. The patient is status post cholecystectomy, with clips noted along the gallbladder fossa. The pancreas and adrenal glands are unremarkable. The kidneys are unremarkable in appearance. There is no evidence of hydronephrosis. No renal or ureteral stones are seen. Nonspecific perinephric stranding is noted bilaterally. No free fluid is identified. Fluid-filled loops of small bowel are seen, without significant dilatation to suggest obstruction. This may reflect mild bowel dysmotility. The stomach is within normal limits. No acute vascular abnormalities are seen. Scattered calcification is seen along the abdominal aorta and its branches. The appendix is not definitely characterized; there is no evidence of appendicitis. The colon is partially filled with fluid and air, with decompression of the  descending and sigmoid colon. Scattered diverticulosis is noted along the proximal descending and sigmoid colon, without evidence of diverticulitis. The bladder is mildly distended and grossly unremarkable. The patient is status post hysterectomy. The ovaries are relatively symmetric. No suspicious adnexal masses are seen. No inguinal lymphadenopathy is seen. No acute osseous abnormalities are identified. Vacuum phenomenon is noted along the lower lumbar spine, with disc space narrowing at L3-L4, and underlying facet disease. IMPRESSION: 1. No definite acute abnormality seen to explain the patient's symptoms. 2. Small bowel largely filled with fluid, with the colon partially filled with fluid. No evidence for obstruction. This may reflect mild bowel dysmotility. 3. Mild interstitial prominence at the lung bases, raising question for minimal interstitial edema. 4. Scattered diverticulosis along the proximal descending and sigmoid colon, without evidence of diverticulitis. 5. Scattered calcification along the abdominal aorta and its branches. 6. Mild degenerative change along the lower lumbar spine. Electronically Signed   By: Garald Balding M.D.   On: 03/17/2016 01:21   Dg Chest 2 View  03/16/2016  CLINICAL DATA:  Dyspnea.  Mid abdominal pain.  Three days duration EXAM: CHEST  2 VIEW COMPARISON:  None. FINDINGS: The heart size and mediastinal contours are within normal limits. Both lungs are clear. The visualized skeletal structures are unremarkable. IMPRESSION: No active cardiopulmonary disease. Electronically Signed   By: Andreas Newport M.D.   On: 03/16/2016 22:19    Microbiology: Recent Results (from the past 240 hour(s))  Culture, Urine     Status: Abnormal   Collection Time: 03/21/16  9:34 AM  Result Value Ref Range Status   Specimen Description URINE, RANDOM  Final   Special Requests NONE  Final   Culture >=100,000 COLONIES/mL KLEBSIELLA PNEUMONIAE (A)  Final   Report Status 03/23/2016 FINAL   Final   Organism ID, Bacteria KLEBSIELLA PNEUMONIAE (A)  Final      Susceptibility   Klebsiella pneumoniae - MIC*    AMPICILLIN 16 RESISTANT Resistant     CEFAZOLIN <=4 SENSITIVE Sensitive     CEFTRIAXONE <=1 SENSITIVE Sensitive     CIPROFLOXACIN <=0.25 SENSITIVE Sensitive     GENTAMICIN <=1 SENSITIVE Sensitive     IMIPENEM <=0.25 SENSITIVE Sensitive     NITROFURANTOIN 64 INTERMEDIATE Intermediate     TRIMETH/SULFA <=20 SENSITIVE Sensitive     AMPICILLIN/SULBACTAM <=2 SENSITIVE Sensitive     PIP/TAZO <=4 SENSITIVE Sensitive     * >=100,000 COLONIES/mL KLEBSIELLA PNEUMONIAE     Labs: Basic Metabolic Panel:  Recent Labs Lab 03/21/16 0025 03/22/16 0403 03/23/16 0345 03/24/16 0351  NA 137 140 136 140  K 4.8 4.7 4.5 4.7  CL 108 111 111 112*  CO2 17* 21* 16* 22  GLUCOSE 139* 97 136* 94  BUN 41* 31* 22* 22*  CREATININE 2.24* 1.78* 1.40* 1.39*  CALCIUM 9.7 8.1* 8.3* 8.6*   Liver Function Tests:  Recent Labs Lab 03/21/16 0025 03/22/16 0403 03/24/16 0351  AST 18 14* 13*  ALT 17 16 16   ALKPHOS 91 71 81  BILITOT 0.3 0.2* <0.1*  PROT 8.2* 5.9* 5.9*  ALBUMIN 4.6 3.2* 3.3*    Recent Labs Lab 03/21/16 0025 03/23/16 0345  LIPASE 93* 102*   No results for input(s): AMMONIA in the last 168 hours. CBC:  Recent Labs Lab 03/21/16 0025 03/23/16 0345 03/24/16 0351  WBC 15.4* 6.2 7.5  HGB 13.2 10.5* 10.1*  HCT 38.9 31.3* 29.5*  MCV 90.3 91.3 91.0  PLT 395 275 279   Cardiac Enzymes:  Recent Labs Lab 03/21/16 0900 03/21/16 1549  TROPONINI <0.03 <0.03   BNP: BNP (last 3 results) No results for input(s): BNP in the last 8760 hours.  ProBNP (last 3 results) No results for input(s): PROBNP in the last 8760 hours.  CBG: No results for input(s): GLUCAP in the last 168 hours.     SignedDomenic Polite MD.  [facility] 03/24/2016, 8:38 AM

## 2016-04-03 NOTE — ED Provider Notes (Signed)
CSN: Hunter:[id number]     Arrival date & time 03/20/16  2310 History   First MD Initiated Contact with Patient 03/21/16 [phone]     Chief Complaint  Patient presents with  . Abdominal Pain     (Consider location/radiation/quality/duration/timing/severity/associated sxs/prior Treatment) HPI Comments: Patient presents with complaint of upper abdominal pain, mostly epigastric, for the past several days. Seen initially on 03/16/16 with similar complaints and discharged home with sucralfate or protonix. She reports there was no improvement with these medications. No fever. No diarrhea, nausea or vomiting. She does reports she is belching with increased frequency. She has a recent history of renal insufficiency, diagnosed on ED visit 4/14, and gastritis. Per patient, her doctor advised she return to the ED secondary to increased pain.   Patient is a 75 y.o. female presenting with abdominal pain. The history is provided by the patient. No language interpreter was used.  Abdominal Pain Associated symptoms: no chest pain, no diarrhea, no dysuria, no fever, no nausea, no shortness of breath and no vomiting     Past Medical History  Diagnosis Date  . Hypertension   . Anxiety   . Depression    Past Surgical History  Procedure Laterality Date  . Cholecystectomy     Family History  Problem Relation Age of Onset  . Dementia Mother   . AAA (abdominal aortic aneurysm) Father    Social History  Substance Use Topics  . Smoking status: Never Smoker   . Smokeless tobacco: Never Used  . Alcohol Use: No   OB History    No data available     Review of Systems  Constitutional: Negative for fever.  Respiratory: Negative.  Negative for shortness of breath.   Cardiovascular: Negative.  Negative for chest pain.  Gastrointestinal: Positive for abdominal pain. Negative for nausea, vomiting and diarrhea.  Genitourinary: Negative for dysuria.  Musculoskeletal: Negative for myalgias.  Neurological: Negative for  weakness.      Allergies  Review of patient's allergies indicates no known allergies.  Home Medications   Prior to Admission medications   Medication Sig Start Date End Date Taking? Authorizing Provider  clorazepate (TRANXENE) 3.75 MG tablet Take 3.75 mg by mouth at bedtime.  03/14/16  Yes Historical Provider, MD  diltiazem (CARDIZEM CD) 240 MG 24 hr capsule Take 240 mg by mouth daily.  03/05/16  Yes Historical Provider, MD  imipramine (TOFRANIL) 50 MG tablet Take 50 mg by mouth at bedtime.   Yes Historical Provider, MD  ondansetron (ZOFRAN ODT) 4 MG disintegrating tablet Take 1 tablet (4 mg total) by mouth every 8 (eight) hours as needed for nausea or vomiting. 03/17/16  Yes Kristen N Ward, DO  pantoprazole (PROTONIX) 40 MG tablet Take 1 tablet (40 mg total) by mouth daily. 03/17/16  Yes Kristen N Ward, DO  spironolactone (ALDACTONE) 50 MG tablet Take 50 mg by mouth daily.  03/05/16  Yes Historical Provider, MD  ciprofloxacin (CIPRO) 250 MG tablet Take 1 tablet (250 mg total) by mouth 2 (two) times daily. For 3days 03/24/16   Domenic Polite, MD  UNABLE TO FIND This note is to excuse Ms.Philipson from work due to medical illness requiring hospitalization, ok to return to work 4/28 03/24/16   Domenic Polite, MD   BP 99/34 mmHg  Pulse 73  Temp(Src) 97.9 F (36.6 C) (Oral)  Resp 17  Ht 5' (1.524 m)  Wt 80.287 kg  BMI 34.57 kg/m2  SpO2 98% Physical Exam  Constitutional: She is oriented to person,  place, and time. She appears well-developed and well-nourished.  HENT:  Head: Normocephalic.  Neck: Normal range of motion. Neck supple.  Cardiovascular: Normal rate and regular rhythm.   No murmur heard. Pulmonary/Chest: Effort normal and breath sounds normal. She has no wheezes. She has no rales. She exhibits no tenderness.  Abdominal: Soft. Bowel sounds are normal. There is tenderness (Tender across upper abdomen. ). There is no rebound and no guarding.  Musculoskeletal: Normal range of motion.   Neurological: She is alert and oriented to person, place, and time.  Skin: Skin is warm and dry. No rash noted.  Psychiatric: She has a normal mood and affect.    ED Course  Procedures (including critical care time) Labs Review Labs Reviewed  URINE CULTURE - Abnormal; Notable for the following:    Culture >=100,000 COLONIES/mL KLEBSIELLA PNEUMONIAE (*)    Organism ID, Bacteria KLEBSIELLA PNEUMONIAE (*)    All other components within normal limits  LIPASE, BLOOD - Abnormal; Notable for the following:    Lipase 93 (*)    All other components within normal limits  COMPREHENSIVE METABOLIC PANEL - Abnormal; Notable for the following:    CO2 17 (*)    Glucose, Bld 139 (*)    BUN 41 (*)    Creatinine, Ser 2.24 (*)    Total Protein 8.2 (*)    GFR calc non Af Amer 21 (*)    GFR calc Af Amer 24 (*)    All other components within normal limits  CBC - Abnormal; Notable for the following:    WBC 15.4 (*)    All other components within normal limits  URINALYSIS, ROUTINE W REFLEX MICROSCOPIC (NOT AT [facility]) - Abnormal; Notable for the following:    Nitrite POSITIVE (*)    Leukocytes, UA SMALL (*)    All other components within normal limits  URINE MICROSCOPIC-ADD ON - Abnormal; Notable for the following:    Squamous Epithelial / LPF 0-5 (*)    Bacteria, UA MANY (*)    All other components within normal limits  COMPREHENSIVE METABOLIC PANEL - Abnormal; Notable for the following:    CO2 21 (*)    BUN 31 (*)    Creatinine, Ser 1.78 (*)    Calcium 8.1 (*)    Total Protein 5.9 (*)    Albumin 3.2 (*)    AST 14 (*)    Total Bilirubin 0.2 (*)    GFR calc non Af Amer 27 (*)    GFR calc Af Amer 31 (*)    All other components within normal limits  HEMOGLOBIN A1C - Abnormal; Notable for the following:    Hgb A1c MFr Bld 6.2 (*)    All other components within normal limits  CBC - Abnormal; Notable for the following:    RBC 3.43 (*)    Hemoglobin 10.5 (*)    HCT 31.3 (*)    All other  components within normal limits  BASIC METABOLIC PANEL - Abnormal; Notable for the following:    CO2 16 (*)    Glucose, Bld 136 (*)    BUN 22 (*)    Creatinine, Ser 1.40 (*)    Calcium 8.3 (*)    GFR calc non Af Amer 36 (*)    GFR calc Af Amer 42 (*)    All other components within normal limits  LIPASE, BLOOD - Abnormal; Notable for the following:    Lipase 102 (*)    All other components within normal limits  CBC - Abnormal; Notable for the following:    RBC 3.24 (*)    Hemoglobin 10.1 (*)    HCT 29.5 (*)    All other components within normal limits  COMPREHENSIVE METABOLIC PANEL - Abnormal; Notable for the following:    Chloride 112 (*)    BUN 22 (*)    Creatinine, Ser 1.39 (*)    Calcium 8.6 (*)    Total Protein 5.9 (*)    Albumin 3.3 (*)    AST 13 (*)    Total Bilirubin <0.1 (*)    GFR calc non Af Amer 37 (*)    GFR calc Af Amer 42 (*)    All other components within normal limits  LACTIC ACID, PLASMA  LACTIC ACID, PLASMA  TROPONIN I  TROPONIN I  I-STAT TROPOININ, ED    Imaging Review No results found. I have personally reviewed and evaluated these images and lab results as part of my medical decision-making.   EKG Interpretation   Date/Time:  Wednesday March 21 2016 05:12:52 EDT Ventricular Rate:  101 PR Interval:  179 QRS Duration: 91 QT Interval:  342 QTC Calculation: 443 R Axis:   93 Text Interpretation:  Sinus tachycardia Right axis deviation Low voltage,  precordial leads ST elevation, consider inferior injury ST changes not  seen previously Confirmed by Florina Ou  MD, Jenny Reichmann ([id number]) on 03/21/2016 5:21:19  AM Also confirmed by Florina Ou  MD, Jenny Reichmann ([id number]), editor [place]  CCT,  BEVERLY ([id number])  on 03/21/2016 8:03:57 AM      MDM   Final diagnoses:  Other acute pancreatitis  AKI (acute kidney injury) ([facility])    The patient returns to the ED with recurrent upper abdominal pain. Improved with medications in the ED. No fever at any time. She is found to  have an elevated lipase - s/p cholecystectomy x years - and no history of pancreatitis. Renal function consistent with previous. New ST abnormality on EKG, negative troponin.  Discussed with hospitalist for admission. Dr. Florina Ou has seen and evaluated the patient. Discussed care plan with patient who is comfortable and agrees with admission.    Charlann Lange, PA-C 04/03/16 0025

## 2016-04-05 DIAGNOSIS — K859 Acute pancreatitis without necrosis or infection, unspecified: Secondary | ICD-10-CM | POA: Diagnosis not present

## 2016-04-05 DIAGNOSIS — D559 Anemia due to enzyme disorder, unspecified: Secondary | ICD-10-CM | POA: Diagnosis not present

## 2016-04-05 DIAGNOSIS — R799 Abnormal finding of blood chemistry, unspecified: Secondary | ICD-10-CM | POA: Diagnosis not present

## 2016-04-05 DIAGNOSIS — N390 Urinary tract infection, site not specified: Secondary | ICD-10-CM | POA: Diagnosis not present

## 2016-04-21 DIAGNOSIS — G4733 Obstructive sleep apnea (adult) (pediatric): Secondary | ICD-10-CM | POA: Diagnosis not present

## 2016-05-11 ENCOUNTER — Other Ambulatory Visit: Payer: Self-pay | Admitting: Internal Medicine

## 2016-05-11 DIAGNOSIS — Z1231 Encounter for screening mammogram for malignant neoplasm of breast: Secondary | ICD-10-CM

## 2016-05-12 DIAGNOSIS — G4733 Obstructive sleep apnea (adult) (pediatric): Secondary | ICD-10-CM | POA: Diagnosis not present

## 2016-05-22 DIAGNOSIS — G4733 Obstructive sleep apnea (adult) (pediatric): Secondary | ICD-10-CM | POA: Diagnosis not present

## 2016-06-12 ENCOUNTER — Ambulatory Visit
Admission: RE | Admit: 2016-06-12 | Discharge: 2016-06-12 | Disposition: A | Discharge status: Home / Self Care | Payer: Medicare Other | Source: Ambulatory Visit | Attending: Internal Medicine | Admitting: Internal Medicine

## 2016-06-12 DIAGNOSIS — Z1231 Encounter for screening mammogram for malignant neoplasm of breast: Secondary | ICD-10-CM

## 2016-06-14 DIAGNOSIS — M1990 Unspecified osteoarthritis, unspecified site: Secondary | ICD-10-CM | POA: Diagnosis not present

## 2016-06-14 DIAGNOSIS — M6740 Ganglion, unspecified site: Secondary | ICD-10-CM | POA: Diagnosis not present

## 2016-06-28 DIAGNOSIS — E785 Hyperlipidemia, unspecified: Secondary | ICD-10-CM | POA: Diagnosis not present

## 2016-06-28 DIAGNOSIS — D559 Anemia due to enzyme disorder, unspecified: Secondary | ICD-10-CM | POA: Diagnosis not present

## 2016-06-28 DIAGNOSIS — Z0000 Encounter for general adult medical examination without abnormal findings: Secondary | ICD-10-CM | POA: Diagnosis not present

## 2016-06-28 DIAGNOSIS — E039 Hypothyroidism, unspecified: Secondary | ICD-10-CM | POA: Diagnosis not present

## 2016-06-29 ENCOUNTER — Other Ambulatory Visit: Payer: Self-pay | Admitting: Internal Medicine

## 2016-06-29 ENCOUNTER — Ambulatory Visit
Admission: RE | Admit: 2016-06-29 | Discharge: 2016-06-29 | Disposition: A | Discharge status: Home / Self Care | Payer: Medicare Other | Source: Ambulatory Visit | Attending: Internal Medicine | Admitting: Internal Medicine

## 2016-06-29 DIAGNOSIS — M1990 Unspecified osteoarthritis, unspecified site: Secondary | ICD-10-CM

## 2016-06-29 DIAGNOSIS — M19041 Primary osteoarthritis, right hand: Secondary | ICD-10-CM | POA: Diagnosis not present

## 2016-08-23 DIAGNOSIS — R350 Frequency of micturition: Secondary | ICD-10-CM | POA: Diagnosis not present

## 2016-09-18 DIAGNOSIS — R350 Frequency of micturition: Secondary | ICD-10-CM | POA: Diagnosis not present

## 2016-10-02 DIAGNOSIS — M549 Dorsalgia, unspecified: Secondary | ICD-10-CM | POA: Diagnosis not present

## 2016-10-02 DIAGNOSIS — M4316 Spondylolisthesis, lumbar region: Secondary | ICD-10-CM | POA: Diagnosis not present

## 2016-10-02 DIAGNOSIS — M546 Pain in thoracic spine: Secondary | ICD-10-CM | POA: Diagnosis not present

## 2016-10-02 DIAGNOSIS — M48062 Spinal stenosis, lumbar region with neurogenic claudication: Secondary | ICD-10-CM | POA: Diagnosis not present

## 2016-10-02 DIAGNOSIS — M4726 Other spondylosis with radiculopathy, lumbar region: Secondary | ICD-10-CM | POA: Diagnosis not present

## 2016-10-02 DIAGNOSIS — M5136 Other intervertebral disc degeneration, lumbar region: Secondary | ICD-10-CM | POA: Diagnosis not present

## 2016-10-05 DIAGNOSIS — M48061 Spinal stenosis, lumbar region without neurogenic claudication: Secondary | ICD-10-CM | POA: Diagnosis not present

## 2016-10-05 DIAGNOSIS — M48062 Spinal stenosis, lumbar region with neurogenic claudication: Secondary | ICD-10-CM | POA: Diagnosis not present

## 2016-10-15 ENCOUNTER — Other Ambulatory Visit: Payer: Self-pay | Admitting: Neurosurgery

## 2016-10-15 DIAGNOSIS — M48062 Spinal stenosis, lumbar region with neurogenic claudication: Secondary | ICD-10-CM | POA: Diagnosis not present

## 2016-10-15 DIAGNOSIS — M4316 Spondylolisthesis, lumbar region: Secondary | ICD-10-CM | POA: Diagnosis not present

## 2016-10-15 DIAGNOSIS — M5126 Other intervertebral disc displacement, lumbar region: Secondary | ICD-10-CM | POA: Diagnosis not present

## 2016-10-15 DIAGNOSIS — M5416 Radiculopathy, lumbar region: Secondary | ICD-10-CM | POA: Diagnosis not present

## 2016-10-15 DIAGNOSIS — M4726 Other spondylosis with radiculopathy, lumbar region: Secondary | ICD-10-CM | POA: Diagnosis not present

## 2016-10-30 DIAGNOSIS — G4730 Sleep apnea, unspecified: Secondary | ICD-10-CM | POA: Diagnosis not present

## 2016-10-30 DIAGNOSIS — K859 Acute pancreatitis without necrosis or infection, unspecified: Secondary | ICD-10-CM | POA: Diagnosis not present

## 2016-10-30 DIAGNOSIS — I10 Essential (primary) hypertension: Secondary | ICD-10-CM | POA: Diagnosis not present

## 2016-10-30 DIAGNOSIS — M1990 Unspecified osteoarthritis, unspecified site: Secondary | ICD-10-CM | POA: Diagnosis not present

## 2016-11-12 ENCOUNTER — Encounter (HOSPITAL_COMMUNITY)
Admission: RE | Admit: 2016-11-12 | Discharge: 2016-11-12 | Disposition: A | Discharge status: Home / Self Care | Payer: Medicare Other | Source: Ambulatory Visit | Attending: Neurosurgery | Admitting: Neurosurgery

## 2016-11-12 ENCOUNTER — Encounter (HOSPITAL_COMMUNITY): Payer: Self-pay

## 2016-11-12 DIAGNOSIS — Z01818 Encounter for other preprocedural examination: Secondary | ICD-10-CM | POA: Diagnosis not present

## 2016-11-12 DIAGNOSIS — Z01812 Encounter for preprocedural laboratory examination: Secondary | ICD-10-CM | POA: Insufficient documentation

## 2016-11-12 DIAGNOSIS — M48061 Spinal stenosis, lumbar region without neurogenic claudication: Secondary | ICD-10-CM | POA: Diagnosis not present

## 2016-11-12 HISTORY — DX: Adverse effect of unspecified anesthetic, initial encounter: T41.45XA

## 2016-11-12 HISTORY — DX: Other chronic pancreatitis: K86.1

## 2016-11-12 HISTORY — DX: Sleep apnea, unspecified: G47.30

## 2016-11-12 LAB — CBC
HEMATOCRIT: 40.5 % (ref 36.0–46.0)
Hemoglobin: 13.4 g/dL (ref 12.0–15.0)
MCH: 30.7 pg (ref 26.0–34.0)
MCHC: 33.1 g/dL (ref 30.0–36.0)
MCV: 92.9 fL (ref 78.0–100.0)
Platelets: 331 10*3/uL (ref 150–400)
RBC: 4.36 MIL/uL (ref 3.87–5.11)
RDW: 12.3 % (ref 11.5–15.5)
WBC: 10.8 10*3/uL — AB (ref 4.0–10.5)

## 2016-11-12 LAB — TYPE AND SCREEN
ABO/RH(D): O POS
Antibody Screen: NEGATIVE

## 2016-11-12 LAB — ABO/RH: ABO/RH(D): O POS

## 2016-11-12 LAB — BASIC METABOLIC PANEL
Anion gap: 9 (ref 5–15)
BUN: 18 mg/dL (ref 6–20)
CHLORIDE: 104 mmol/L (ref 101–111)
CO2: 25 mmol/L (ref 22–32)
Calcium: 9.3 mg/dL (ref 8.9–10.3)
Creatinine, Ser: 1.61 mg/dL — ABNORMAL HIGH (ref 0.44–1.00)
GFR calc Af Amer: 35 mL/min — ABNORMAL LOW (ref >60)
GFR calc non Af Amer: 30 mL/min — ABNORMAL LOW (ref >60)
Glucose, Bld: 111 mg/dL — ABNORMAL HIGH (ref 65–99)
POTASSIUM: 4.6 mmol/L (ref 3.5–5.1)
SODIUM: 138 mmol/L (ref 135–145)

## 2016-11-12 LAB — SURGICAL PCR SCREEN
MRSA, PCR: NEGATIVE
Staphylococcus aureus: NEGATIVE

## 2016-11-12 NOTE — Pre-Procedure Instructions (Addendum)
    [facility]ancho [facility]  11/12/2016      [facility] # [place], [place] [address] [place] [address] Phone: [phone] Fax: [phone]    Your procedure is scheduled on Mon. Dec. 18  Report to [facility] at 5:30 A.M.  Call this number if you have problems the morning of surgery:  [phone]   Remember:  Do not eat food or drink liquids after midnight on Sun. Dec. 17   Take these medicines the morning of surgery with A SIP OF WATER : tylenol if needed               Stop aspirin, motrin, advil, ibuprofen, aleve, BC Powders, Goody's, vitamins/herbal medicines.   Do not wear jewelry, make-up or nail polish.  Do not wear lotions, powders, or perfumes, or deoderant.  Do not shave 48 hours prior to surgery.  Men may shave face and neck.  Do not bring valuables to the hospital.  [facility] is not responsible for any belongings or valuables.  Contacts, dentures or bridgework may not be worn into surgery.  Leave your suitcase in the car.  After surgery it may be brought to your room.  For patients admitted to the hospital, discharge time will be determined by your treatment team.  Patients discharged the day of surgery will not be allowed to drive home.    Special instructions:  Review preparing for surgery handout  Please read over the following fact sheets that you were given. Coughing and Deep Breathing and MRSA Information

## 2016-11-12 NOTE — Progress Notes (Signed)
PCP: Dr. Zada Girt on [place]  Pt. Has sleep apnea ,last study 12 yrs. Ago  Pt. Reported during 2003 gallbladder surgery she stopped breathing and surgery was stopped , she was taken to ICU and couple days later went back for the surgery. Notified Randa Lynn . Stated she will request anesthesia  Notes from that surgery.

## 2016-11-13 NOTE — Progress Notes (Addendum)
Anesthesia Chart Review:  Pt is a 75 year old female scheduled for L3-4, L4-5, L5-S1 PLIF, L3-S1 PLA on 11/19/2016 with Jovita Gamma, MD.   - PCP is Zada Girt, MD.   PMH includes:  HTN, OSA, chronic pancreatitis. Never smoker. BMI 34  Anesthesia history: Pt reports "respiratory arrest" during gallbladder surgery in 2003. Review of anesthesia records and medical records from that time indicate pt experienced severe hypotension about 20 minutes into a lap chole procedure on 07/13/02 that did not respond to dopamine, epinephrine, Neo, fluid resuscitation or trendelenburg. Surgery was aborted, cardiology called in. Pt transferred to ICU. Notes from cardiology indicate hypotension likely due to hypovolemic shock; apparently pre-op hgb was normal but intraop hgb was 8. No source of bleeding identified, notes question possible GI bleed. Pt received blood transfusion. Per discharge summary, no definitive cause of hypotension identified. Pt recovered over course of several hours and 2 days later she returned to the OR to complete cholecystectomy. Records are on paper chart.   Intubation from this surgery was difficult. Notes state that on 4th attempt, nasotracheal intubation was successful using 7.5 tube.  MAC3 x2 and bougie attempts unsuccessful.   Medications include: diltiazem, lisinopril, protonix, spironolactone.   Preoperative labs reviewed.  Cr 1.61, BUN 18. Previous results show Cr 1.39 -2.24 over last year.    CXR 03/16/16: No active cardiopulmonary disease.  EKG 03/21/16: Sinus tachycardia (101 bpm). Right axis deviation. Low voltage, precordial leads. ST elevation, consider inferior injury. ST changes not seen previously.  - EKG done in the ER in the setting of pancreatitis, UTI, AKI. Troponin x3 negative. Echo with normal wall motion (see below).  Echo 03/23/16:  - Left ventricle: The cavity size was normal. Wall thickness was normal. Systolic function was normal. The estimated ejection  fraction was in the range of 60% to 65%. Wall motion was normal; there were no regional wall motion abnormalities. Doppler parameters are consistent with abnormal left ventricular relaxation (grade 1 diastolic dysfunction). - Aortic valve: There was no stenosis. - Mitral valve: There was no significant regurgitation. - Right ventricle: The cavity size was normal. Systolic function was normal. - Pulmonary arteries: No complete TR doppler jet so unable to estimate PA systolic pressure. - Inferior vena cava: The vessel was normal in size. The respirophasic diameter changes were in the normal range (>= 50%), consistent with normal central venous pressure.  If no changes, I anticipate pt can proceed with surgery as scheduled.   Willeen Cass, FNP-BC [facility] Short Stay [facility]/Anesthesiology Phone: [phone] 11/16/2016 1:50 PM

## 2016-11-19 ENCOUNTER — Inpatient Hospital Stay (HOSPITAL_COMMUNITY): Payer: Medicare Other

## 2016-11-19 ENCOUNTER — Inpatient Hospital Stay (HOSPITAL_COMMUNITY): Payer: Medicare Other | Admitting: Anesthesiology

## 2016-11-19 ENCOUNTER — Inpatient Hospital Stay (HOSPITAL_COMMUNITY)
Admission: RE | Admit: 2016-11-19 | Discharge: 2016-11-21 | DRG: 460 | Disposition: A | Discharge status: Skilled Nursing Facility | Payer: Medicare Other | Source: Ambulatory Visit | Attending: Neurosurgery | Admitting: Neurosurgery

## 2016-11-19 ENCOUNTER — Encounter (HOSPITAL_COMMUNITY)
Admission: RE | Disposition: A | Discharge status: Skilled Nursing Facility | Payer: Self-pay | Source: Ambulatory Visit | Attending: Neurosurgery

## 2016-11-19 DIAGNOSIS — F419 Anxiety disorder, unspecified: Secondary | ICD-10-CM | POA: Diagnosis not present

## 2016-11-19 DIAGNOSIS — Z981 Arthrodesis status: Secondary | ICD-10-CM | POA: Diagnosis not present

## 2016-11-19 DIAGNOSIS — M545 Low back pain: Secondary | ICD-10-CM | POA: Diagnosis not present

## 2016-11-19 DIAGNOSIS — I10 Essential (primary) hypertension: Secondary | ICD-10-CM | POA: Diagnosis not present

## 2016-11-19 DIAGNOSIS — G4730 Sleep apnea, unspecified: Secondary | ICD-10-CM | POA: Diagnosis present

## 2016-11-19 DIAGNOSIS — M5126 Other intervertebral disc displacement, lumbar region: Principal | ICD-10-CM | POA: Diagnosis present

## 2016-11-19 DIAGNOSIS — M47816 Spondylosis without myelopathy or radiculopathy, lumbar region: Secondary | ICD-10-CM | POA: Diagnosis not present

## 2016-11-19 DIAGNOSIS — M48061 Spinal stenosis, lumbar region without neurogenic claudication: Secondary | ICD-10-CM | POA: Diagnosis not present

## 2016-11-19 DIAGNOSIS — N289 Disorder of kidney and ureter, unspecified: Secondary | ICD-10-CM | POA: Diagnosis not present

## 2016-11-19 DIAGNOSIS — F329 Major depressive disorder, single episode, unspecified: Secondary | ICD-10-CM | POA: Diagnosis present

## 2016-11-19 DIAGNOSIS — M4316 Spondylolisthesis, lumbar region: Secondary | ICD-10-CM | POA: Diagnosis present

## 2016-11-19 DIAGNOSIS — Z419 Encounter for procedure for purposes other than remedying health state, unspecified: Secondary | ICD-10-CM

## 2016-11-19 DIAGNOSIS — M5136 Other intervertebral disc degeneration, lumbar region: Secondary | ICD-10-CM | POA: Diagnosis present

## 2016-11-19 DIAGNOSIS — M48062 Spinal stenosis, lumbar region with neurogenic claudication: Secondary | ICD-10-CM | POA: Diagnosis not present

## 2016-11-19 DIAGNOSIS — Z79899 Other long term (current) drug therapy: Secondary | ICD-10-CM | POA: Diagnosis not present

## 2016-11-19 DIAGNOSIS — M4326 Fusion of spine, lumbar region: Secondary | ICD-10-CM | POA: Diagnosis not present

## 2016-11-19 SURGERY — POSTERIOR LUMBAR FUSION 3 LEVEL
Anesthesia: General | Site: Back

## 2016-11-19 MED ORDER — THROMBIN 20000 UNITS EX SOLR
CUTANEOUS | Status: AC
  Filled 2016-11-19: qty 20000

## 2016-11-19 MED ORDER — MENTHOL 3 MG MT LOZG: 1.0000 | LOZENGE | OROMUCOSAL | Status: DC | PRN

## 2016-11-19 MED ORDER — HYDROMORPHONE HCL 1 MG/ML IJ SOLN
0.2500 mg | INTRAMUSCULAR | Status: DC | PRN
  Administered 2016-11-19: 0.5 mg via INTRAVENOUS

## 2016-11-19 MED ORDER — ONDANSETRON HCL 4 MG/2ML IJ SOLN
4.0000 mg | Freq: Four times a day (QID) | INTRAMUSCULAR | Status: AC | PRN
  Administered 2016-11-19: 4 mg via INTRAVENOUS

## 2016-11-19 MED ORDER — ACETAMINOPHEN 325 MG PO TABS: 650.0000 mg | ORAL_TABLET | ORAL | Status: DC | PRN

## 2016-11-19 MED ORDER — BUPIVACAINE HCL (PF) 0.5 % IJ SOLN
INTRAMUSCULAR | Status: DC | PRN
  Administered 2016-11-19: 20 mL

## 2016-11-19 MED ORDER — BISACODYL 10 MG RE SUPP: 10.0000 mg | Freq: Every day | RECTAL | Status: DC | PRN

## 2016-11-19 MED ORDER — SPIRONOLACTONE 50 MG PO TABS
50.0000 mg | ORAL_TABLET | Freq: Every day | ORAL | Status: DC
  Administered 2016-11-19 – 2016-11-21 (×3): 50 mg via ORAL
  Filled 2016-11-19 (×4): qty 1

## 2016-11-19 MED ORDER — OXYCODONE HCL 5 MG/5ML PO SOLN: 5.0000 mg | Freq: Once | ORAL | Status: DC | PRN

## 2016-11-19 MED ORDER — LISINOPRIL 20 MG PO TABS
40.0000 mg | ORAL_TABLET | Freq: Every day | ORAL | Status: DC
Start: 2016-11-19 — End: 2016-11-21
  Filled 2016-11-19 (×3): qty 2

## 2016-11-19 MED ORDER — HYDROXYZINE HCL 50 MG/ML IM SOLN: 50.0000 mg | INTRAMUSCULAR | Status: DC | PRN

## 2016-11-19 MED ORDER — ACETAMINOPHEN 10 MG/ML IV SOLN
1000.0000 mg | INTRAVENOUS | Status: DC
  Filled 2016-11-19: qty 100

## 2016-11-19 MED ORDER — SODIUM CHLORIDE 0.9% FLUSH: 3.0000 mL | INTRAVENOUS | Status: DC | PRN

## 2016-11-19 MED ORDER — FENTANYL CITRATE (PF) 100 MCG/2ML IJ SOLN
INTRAMUSCULAR | Status: DC | PRN
  Administered 2016-11-19 (×7): 50 ug via INTRAVENOUS

## 2016-11-19 MED ORDER — ACETAMINOPHEN 10 MG/ML IV SOLN
INTRAVENOUS | Status: AC
  Filled 2016-11-19: qty 100

## 2016-11-19 MED ORDER — CHLORHEXIDINE GLUCONATE CLOTH 2 % EX PADS: 6.0000 | MEDICATED_PAD | Freq: Once | CUTANEOUS | Status: DC

## 2016-11-19 MED ORDER — ONDANSETRON HCL 4 MG/2ML IJ SOLN
INTRAMUSCULAR | Status: AC
  Filled 2016-11-19: qty 2

## 2016-11-19 MED ORDER — PROPOFOL 10 MG/ML IV BOLUS
INTRAVENOUS | Status: AC
  Filled 2016-11-19: qty 20

## 2016-11-19 MED ORDER — HYDROMORPHONE HCL 1 MG/ML IJ SOLN
INTRAMUSCULAR | Status: AC
  Filled 2016-11-19: qty 0.5

## 2016-11-19 MED ORDER — SODIUM CHLORIDE 0.9% FLUSH
3.0000 mL | Freq: Two times a day (BID) | INTRAVENOUS | Status: DC
  Administered 2016-11-19 – 2016-11-20 (×2): 3 mL via INTRAVENOUS

## 2016-11-19 MED ORDER — PHENYLEPHRINE HCL 10 MG/ML IJ SOLN
INTRAMUSCULAR | Status: DC | PRN
  Administered 2016-11-19: 80 ug via INTRAVENOUS

## 2016-11-19 MED ORDER — LIDOCAINE-EPINEPHRINE (PF) 2 %-1:200000 IJ SOLN
INTRAMUSCULAR | Status: DC | PRN
  Administered 2016-11-19: 20 mL

## 2016-11-19 MED ORDER — ZOLPIDEM TARTRATE 5 MG PO TABS
5.0000 mg | ORAL_TABLET | Freq: Every evening | ORAL | Status: DC | PRN
  Administered 2016-11-20: 5 mg via ORAL
  Filled 2016-11-19: qty 1

## 2016-11-19 MED ORDER — ARTIFICIAL TEARS OP OINT
TOPICAL_OINTMENT | OPHTHALMIC | Status: DC | PRN
  Administered 2016-11-19: 1 via OPHTHALMIC

## 2016-11-19 MED ORDER — PROPOFOL 10 MG/ML IV BOLUS
INTRAVENOUS | Status: DC | PRN
  Administered 2016-11-19: 80 mg via INTRAVENOUS
  Administered 2016-11-19: 100 mg via INTRAVENOUS

## 2016-11-19 MED ORDER — FENTANYL CITRATE (PF) 100 MCG/2ML IJ SOLN
INTRAMUSCULAR | Status: AC
  Filled 2016-11-19: qty 2

## 2016-11-19 MED ORDER — ALUM & MAG HYDROXIDE-SIMETH 200-200-20 MG/5ML PO SUSP: 30.0000 mL | Freq: Four times a day (QID) | ORAL | Status: DC | PRN

## 2016-11-19 MED ORDER — OXYCODONE HCL 5 MG PO TABS: 5.0000 mg | ORAL_TABLET | Freq: Once | ORAL | Status: DC | PRN

## 2016-11-19 MED ORDER — THROMBIN 5000 UNITS EX SOLR
CUTANEOUS | Status: AC
  Filled 2016-11-19: qty 5000

## 2016-11-19 MED ORDER — LIDOCAINE HCL (CARDIAC) 20 MG/ML IV SOLN
INTRAVENOUS | Status: DC | PRN
  Administered 2016-11-19: 40 mg via INTRAVENOUS

## 2016-11-19 MED ORDER — HYDROCODONE-ACETAMINOPHEN 5-325 MG PO TABS
1.0000 | ORAL_TABLET | ORAL | Status: DC | PRN
  Administered 2016-11-20 – 2016-11-21 (×2): 1 via ORAL
  Administered 2016-11-21: 2 via ORAL
  Filled 2016-11-19: qty 2
  Filled 2016-11-19 (×2): qty 1

## 2016-11-19 MED ORDER — SUGAMMADEX SODIUM 200 MG/2ML IV SOLN
INTRAVENOUS | Status: DC | PRN
  Administered 2016-11-19: 200 mg via INTRAVENOUS

## 2016-11-19 MED ORDER — CEFAZOLIN SODIUM-DEXTROSE 2-4 GM/100ML-% IV SOLN
2.0000 g | INTRAVENOUS | Status: AC
  Administered 2016-11-19: 2 g via INTRAVENOUS
  Filled 2016-11-19: qty 100

## 2016-11-19 MED ORDER — MAGNESIUM HYDROXIDE 400 MG/5ML PO SUSP: 30.0000 mL | Freq: Every day | ORAL | Status: DC | PRN

## 2016-11-19 MED ORDER — FENTANYL CITRATE (PF) 100 MCG/2ML IJ SOLN
INTRAMUSCULAR | Status: AC
  Filled 2016-11-19: qty 4

## 2016-11-19 MED ORDER — ACETAMINOPHEN 650 MG RE SUPP: 650.0000 mg | RECTAL | Status: DC | PRN

## 2016-11-19 MED ORDER — OXYCODONE-ACETAMINOPHEN 5-325 MG PO TABS
1.0000 | ORAL_TABLET | ORAL | Status: DC | PRN
  Administered 2016-11-19 – 2016-11-20 (×2): 2 via ORAL
  Filled 2016-11-19 (×2): qty 2

## 2016-11-19 MED ORDER — CYCLOBENZAPRINE HCL 10 MG PO TABS
10.0000 mg | ORAL_TABLET | Freq: Three times a day (TID) | ORAL | Status: DC | PRN
  Administered 2016-11-19 – 2016-11-20 (×2): 10 mg via ORAL
  Filled 2016-11-19 (×2): qty 1

## 2016-11-19 MED ORDER — HYDROXYZINE HCL 25 MG PO TABS: 50.0000 mg | ORAL_TABLET | ORAL | Status: DC | PRN

## 2016-11-19 MED ORDER — ROCURONIUM BROMIDE 100 MG/10ML IV SOLN
INTRAVENOUS | Status: DC | PRN
  Administered 2016-11-19 (×2): 10 mg via INTRAVENOUS
  Administered 2016-11-19: 50 mg via INTRAVENOUS
  Administered 2016-11-19: 10 mg via INTRAVENOUS

## 2016-11-19 MED ORDER — LACTATED RINGERS IV SOLN
INTRAVENOUS | Status: DC | PRN
  Administered 2016-11-19 (×3): via INTRAVENOUS

## 2016-11-19 MED ORDER — ONDANSETRON HCL 4 MG PO TABS: 4.0000 mg | ORAL_TABLET | Freq: Four times a day (QID) | ORAL | Status: DC | PRN

## 2016-11-19 MED ORDER — PANTOPRAZOLE SODIUM 40 MG PO TBEC
40.0000 mg | DELAYED_RELEASE_TABLET | Freq: Every day | ORAL | Status: DC
  Administered 2016-11-19 – 2016-11-21 (×3): 40 mg via ORAL
  Filled 2016-11-19 (×3): qty 1

## 2016-11-19 MED ORDER — THROMBIN 5000 UNITS EX SOLR
CUTANEOUS | Status: DC | PRN
  Administered 2016-11-19: 5 mL via TOPICAL

## 2016-11-19 MED ORDER — ALBUMIN HUMAN 5 % IV SOLN
INTRAVENOUS | Status: DC | PRN
Start: 2016-11-19 — End: 2016-11-19
  Administered 2016-11-19: 13:00:00 via INTRAVENOUS

## 2016-11-19 MED ORDER — IMIPRAMINE HCL 50 MG PO TABS
50.0000 mg | ORAL_TABLET | Freq: Every day | ORAL | Status: DC
  Administered 2016-11-19 – 2016-11-20 (×2): 50 mg via ORAL
  Filled 2016-11-19 (×3): qty 1

## 2016-11-19 MED ORDER — SURGIFOAM 100 EX MISC
CUTANEOUS | Status: DC | PRN
  Administered 2016-11-19: 20 mL via TOPICAL

## 2016-11-19 MED ORDER — LIDOCAINE-EPINEPHRINE (PF) 2 %-1:200000 IJ SOLN
INTRAMUSCULAR | Status: AC
  Filled 2016-11-19: qty 20

## 2016-11-19 MED ORDER — MIDAZOLAM HCL 2 MG/2ML IJ SOLN
INTRAMUSCULAR | Status: AC
  Filled 2016-11-19: qty 2

## 2016-11-19 MED ORDER — SODIUM CHLORIDE 0.9 % IV SOLN: 250.0000 mL | INTRAVENOUS | Status: DC

## 2016-11-19 MED ORDER — 0.9 % SODIUM CHLORIDE (POUR BTL) OPTIME
TOPICAL | Status: DC | PRN
  Administered 2016-11-19 (×4): 1000 mL

## 2016-11-19 MED ORDER — DEXTROSE-NACL 5-0.45 % IV SOLN: INTRAVENOUS | Status: DC

## 2016-11-19 MED ORDER — ONDANSETRON HCL 4 MG/2ML IJ SOLN
INTRAMUSCULAR | Status: DC | PRN
  Administered 2016-11-19: 4 mg via INTRAVENOUS

## 2016-11-19 MED ORDER — DILTIAZEM HCL ER COATED BEADS 240 MG PO CP24
240.0000 mg | ORAL_CAPSULE | Freq: Every day | ORAL | Status: DC
Start: 2016-11-19 — End: 2016-11-21
  Administered 2016-11-19 – 2016-11-21 (×3): 240 mg via ORAL
  Filled 2016-11-19: qty 2
  Filled 2016-11-19: qty 1
  Filled 2016-11-19: qty 2
  Filled 2016-11-19 (×2): qty 1

## 2016-11-19 MED ORDER — SODIUM CHLORIDE 0.9 % IR SOLN
Status: DC | PRN
  Administered 2016-11-19: 500 mL

## 2016-11-19 MED ORDER — PHENOL 1.4 % MT LIQD: 1.0000 | OROMUCOSAL | Status: DC | PRN

## 2016-11-19 MED ORDER — MORPHINE SULFATE (PF) 4 MG/ML IV SOLN: 4.0000 mg | INTRAVENOUS | Status: DC | PRN

## 2016-11-19 MED ORDER — ONDANSETRON HCL 4 MG/2ML IJ SOLN: 4.0000 mg | Freq: Four times a day (QID) | INTRAMUSCULAR | Status: DC | PRN

## 2016-11-19 MED ORDER — ACETAMINOPHEN 10 MG/ML IV SOLN
INTRAVENOUS | Status: DC | PRN
  Administered 2016-11-19 (×2): 1000 mg via INTRAVENOUS

## 2016-11-19 SURGICAL SUPPLY — 73 items
BAG DECANTER FOR FLEXI CONT (MISCELLANEOUS) ×2 IMPLANT
BLADE CLIPPER SURG (BLADE) IMPLANT
BUR ACRON 5.0MM COATED (BURR) ×4 IMPLANT
BUR MATCHSTICK NEURO 3.0 LAGG (BURR) ×2 IMPLANT
CANISTER SUCT 3000ML PPV (MISCELLANEOUS) ×2 IMPLANT
CAP LCK SPNE (Orthopedic Implant) ×8 IMPLANT
CAP LOCK SPINE RADIUS (Orthopedic Implant) ×8 IMPLANT
CAP LOCKING (Orthopedic Implant) ×8 IMPLANT
CARTRIDGE OIL MAESTRO DRILL (MISCELLANEOUS) ×1 IMPLANT
CONT SPEC 4OZ CLIKSEAL STRL BL (MISCELLANEOUS) ×2 IMPLANT
COVER BACK TABLE 60X90IN (DRAPES) ×2 IMPLANT
CROSSLINK MEDIUM (Orthopedic Implant) ×2 IMPLANT
DERMABOND ADVANCED (GAUZE/BANDAGES/DRESSINGS) ×2
DERMABOND ADVANCED .7 DNX12 (GAUZE/BANDAGES/DRESSINGS) ×2 IMPLANT
DIFFUSER DRILL AIR PNEUMATIC (MISCELLANEOUS) ×2 IMPLANT
DRAPE C-ARM 42X72 X-RAY (DRAPES) ×4 IMPLANT
DRAPE HALF SHEET 40X57 (DRAPES) IMPLANT
DRAPE LAPAROTOMY 100X72X124 (DRAPES) ×2 IMPLANT
DRAPE POUCH INSTRU U-SHP 10X18 (DRAPES) ×2 IMPLANT
DRAPE SURG 17X23 STRL (DRAPES) ×2 IMPLANT
ELECT REM PT RETURN 9FT ADLT (ELECTROSURGICAL) ×2
ELECTRODE REM PT RTRN 9FT ADLT (ELECTROSURGICAL) ×1 IMPLANT
GAUZE SPONGE 4X4 12PLY STRL (GAUZE/BANDAGES/DRESSINGS) IMPLANT
GAUZE SPONGE 4X4 16PLY XRAY LF (GAUZE/BANDAGES/DRESSINGS) IMPLANT
GLOVE BIOGEL PI IND STRL 7.5 (GLOVE) ×4 IMPLANT
GLOVE BIOGEL PI IND STRL 8 (GLOVE) ×2 IMPLANT
GLOVE BIOGEL PI INDICATOR 7.5 (GLOVE) ×4
GLOVE BIOGEL PI INDICATOR 8 (GLOVE) ×2
GLOVE ECLIPSE 7.5 STRL STRAW (GLOVE) ×4 IMPLANT
GLOVE SS BIOGEL STRL SZ 7.5 (GLOVE) ×1 IMPLANT
GLOVE SUPERSENSE BIOGEL SZ 7.5 (GLOVE) ×1
GOWN STRL REUS W/ TWL LRG LVL3 (GOWN DISPOSABLE) ×3 IMPLANT
GOWN STRL REUS W/ TWL XL LVL3 (GOWN DISPOSABLE) ×3 IMPLANT
GOWN STRL REUS W/TWL 2XL LVL3 (GOWN DISPOSABLE) IMPLANT
GOWN STRL REUS W/TWL LRG LVL3 (GOWN DISPOSABLE) ×3
GOWN STRL REUS W/TWL XL LVL3 (GOWN DISPOSABLE) ×3
HEMOSTAT POWDER KIT SURGIFOAM (HEMOSTASIS) ×2 IMPLANT
KIT BASIN OR (CUSTOM PROCEDURE TRAY) ×2 IMPLANT
KIT INFUSE MEDIUM (Orthopedic Implant) ×2 IMPLANT
KIT ROOM TURNOVER OR (KITS) ×2 IMPLANT
NEEDLE 18GX1X1/2 (RX/OR ONLY) (NEEDLE) ×2 IMPLANT
NEEDLE HYPO 25X1 1.5 SAFETY (NEEDLE) ×2 IMPLANT
NEEDLE SPNL 18GX3.5 QUINCKE PK (NEEDLE) ×4 IMPLANT
NEEDLE SPNL 22GX3.5 QUINCKE BK (NEEDLE) ×2 IMPLANT
NS IRRIG 1000ML POUR BTL (IV SOLUTION) ×8 IMPLANT
OIL CARTRIDGE MAESTRO DRILL (MISCELLANEOUS) ×2
PACK LAMINECTOMY NEURO (CUSTOM PROCEDURE TRAY) ×2 IMPLANT
PAD ARMBOARD 7.5X6 YLW CONV (MISCELLANEOUS) ×6 IMPLANT
PATTIES SURGICAL .5 X.5 (GAUZE/BANDAGES/DRESSINGS) ×4 IMPLANT
PATTIES SURGICAL .5 X1 (DISPOSABLE) IMPLANT
PATTIES SURGICAL 1X1 (DISPOSABLE) ×4 IMPLANT
PEEK PLIF AVS 8X25X4 DEGREE (Peek) ×8 IMPLANT
ROD 80MM (Rod) ×2 IMPLANT
ROD SPNL 80X5.5 NS TI RDS (Rod) ×2 IMPLANT
SCREW 5.75 X 635 (Screw) ×4 IMPLANT
SCREW 5.75X40M (Screw) ×12 IMPLANT
SPONGE GAUZE 4X4 12PLY STER LF (GAUZE/BANDAGES/DRESSINGS) ×2 IMPLANT
SPONGE LAP 4X18 X RAY DECT (DISPOSABLE) ×2 IMPLANT
SPONGE NEURO XRAY DETECT 1X3 (DISPOSABLE) ×2 IMPLANT
SPONGE SURGIFOAM ABS GEL 100 (HEMOSTASIS) ×2 IMPLANT
STRIP BIOACTIVE VITOSS 25X100X (Neuro Prosthesis/Implant) ×4 IMPLANT
SUT PROLENE 6 0 BV (SUTURE) IMPLANT
SUT VIC AB 1 CT1 18XBRD ANBCTR (SUTURE) ×3 IMPLANT
SUT VIC AB 1 CT1 8-18 (SUTURE) ×3
SUT VIC AB 2-0 CP2 18 (SUTURE) ×6 IMPLANT
SYR 3ML LL SCALE MARK (SYRINGE) IMPLANT
SYR CONTROL 10ML LL (SYRINGE) ×2 IMPLANT
TAPE CLOTH SURG 4X10 WHT LF (GAUZE/BANDAGES/DRESSINGS) ×2 IMPLANT
TOWEL OR 17X24 6PK STRL BLUE (TOWEL DISPOSABLE) ×2 IMPLANT
TOWEL OR 17X26 10 PK STRL BLUE (TOWEL DISPOSABLE) ×2 IMPLANT
TRAP SPECIMEN MUCOUS 40CC (MISCELLANEOUS) IMPLANT
TRAY FOLEY W/METER SILVER 16FR (SET/KITS/TRAYS/PACK) ×2 IMPLANT
WATER STERILE IRR 1000ML POUR (IV SOLUTION) ×2 IMPLANT

## 2016-11-19 NOTE — Progress Notes (Signed)
Vitals:   11/19/16 1600 11/19/16 1615 11/19/16 1630 11/19/16 1753  BP: 106/60 110/62 (!) 113/59 117/64  Pulse: 77 81 78 79  Resp: 10 12 12 18   Temp:    98 F (36.7 C)  TempSrc:      SpO2: (!) 89% 97% 93% 97%    Patient resting comfortably in bed, did walk from the stretcher to the bed. Dressing clean and dry. Moving all 4 extremities well. Foley to straight drainage, we'll leave in until the morning.  Plan: Encouraged to ambulate this evening with the staff. To be seen tomorrow by PT and OT. We'll continue to progress through postoperative recovery.  Hosie Spangle, MD 11/19/2016, 6:01 PM

## 2016-11-19 NOTE — Anesthesia Preprocedure Evaluation (Signed)
Anesthesia Evaluation  Patient identified by MRN, date of birth, ID band Patient awake    Reviewed: Allergy & Precautions, NPO status , Patient's Chart, lab work & pertinent test results  History of Anesthesia Complications (+) history of anesthetic complications  Airway Mallampati: II   Neck ROM: full    Dental   Pulmonary sleep apnea ,    breath sounds clear to auscultation       Cardiovascular hypertension,  Rhythm:regular Rate:Normal     Neuro/Psych PSYCHIATRIC DISORDERS Anxiety Depression    GI/Hepatic   Endo/Other    Renal/GU Renal InsufficiencyRenal disease     Musculoskeletal   Abdominal   Peds  Hematology   Anesthesia Other Findings   Reproductive/Obstetrics                             Anesthesia Physical Anesthesia Plan  ASA: III  Anesthesia Plan: General   Post-op Pain Management:    Induction: Intravenous  Airway Management Planned: Oral ETT  Additional Equipment:   Intra-op Plan:   Post-operative Plan: Extubation in OR  Informed Consent: I have reviewed the patients History and Physical, chart, labs and discussed the procedure including the risks, benefits and alternatives for the proposed anesthesia with the patient or authorized representative who has indicated his/her understanding and acceptance.     Plan Discussed with: CRNA, Anesthesiologist and Surgeon  Anesthesia Plan Comments:         Anesthesia Quick Evaluation

## 2016-11-19 NOTE — Transfer of Care (Signed)
Immediate Anesthesia Transfer of Care Note  Patient: Judy [name]  Procedure(s) Performed: Procedure(s): Lumbar three-Sacrum one  lumbar decompression; Lumbar three-four, Lumbar four-five,  Lumbar five-Sacrum one  Posterior Lumbar Interbody Fusion; Lumbar three-Sacrum one  Posterior Lumbar Arthrodesis (N/A)  Patient Location: PACU  Anesthesia Type:General  Level of Consciousness: awake, alert , oriented and patient cooperative  Airway & Oxygen Therapy: Patient Spontanous Breathing and Patient connected to face mask oxygen  Post-op Assessment: Report given to RN and Post -op Vital signs reviewed and stable  Post vital signs: Reviewed  Last Vitals:  Vitals:   11/19/16 0639 11/19/16 1445  BP: (!) 110/52 (!) 119/59  Pulse: 81 80  Resp: 20 10  Temp: 36.7 C 36.4 C    Last Pain:  Vitals:   11/19/16 1445  TempSrc:   PainSc: 0-No pain         Complications: No apparent anesthesia complications

## 2016-11-19 NOTE — Op Note (Signed)
11/19/2016  2:34 PM  PATIENT:  Judy [name]  75 y.o. female  PRE-OPERATIVE DIAGNOSIS:  Multilevel, multifactorial lumbar stenosis with neurogenic claudication; multilevel dynamic lumbar spondylolisthesis; lumbar HNP; lumbar spondylosis; lumbar degenerative disc disease  POST-OPERATIVE DIAGNOSIS:  Multilevel, multifactorial lumbar stenosis with neurogenic claudication; multilevel dynamic lumbar spondylolisthesis; lumbar HNP; lumbar spondylosis; lumbar degenerative disc disease   PROCEDURE:  Procedure(s):  1)  Lumbar decompression including L3-S1 decompressive lumbar laminectomy with decompression of central canal and neural foraminal stenosis, with decompression of the exiting L3, L4, L5, and S1 nerve roots bilaterally; bilateral L3-4, L4-5, and L5-S1 facetectomies; bilateral L3, L4, L5, and S1 foraminotomies;  2)  Bilateral L3-4 posterior lumbar interbody arthrodesis with morcellized allograft;  bilateral L4-5 and L5-S1 posterior lumbar interbody arthrodesis with AVS peek interbody implants, Vitoss BA with bone marrow aspirate, and infuse;  3)  Bilateral L3-S1 posterior lateral arthrodesis with segmental radius posterior instrumentation, locally harvested morcellized autograft, Vitoss BA with bone marrow aspirate, and infuse   SURGEON:  Surgeon(s): Jovita Gamma, MD Tamala Fothergill, MD  ASSISTANTS: Cyndy Freeze, M.D.  ANESTHESIA:   general  EBL:  Total I/O In: [id number] [I.V.:3000; Blood:140; IV Piggyback:250] Out: 825 [Urine:425; Blood:400]  BLOOD ADMINISTERED:140 CC CELLSAVER  COUNT: Correct per nursing staff  DICTATION: Patient was brought to the operating room placed under general endotracheal anesthesia. The patient was turned to prone position, the lumbar region was prepped with Betadine soap and solution and draped in a sterile fashion. The midline was infiltrated with local anesthesia with epinephrine. A localizing x-ray was taken and then a midline incision was made and  carried down through the subcutaneous tissue, bipolar cautery and electrocautery were used to maintain hemostasis. Dissection was carried down to the lumbar fascia. The fascia was incised bilaterally and the paraspinal muscles were dissected with a spinous process and lamina in a subperiosteal fashion. Another x-ray was taken for localization and the L3-4, L4-5, and L5-S1 levels were localized. Dissection was then carried out laterally over the facet complexes and the transverse processes of L3, L4, L5, and the ala of S1 were exposed and decorticated.   We then proceeded with the decompression. Bilateral inferior L3, complete L4, complete L5, and superior S1 laminectomies performed using double-action rongeurs, the high-speed drill, and Kerrison punches. Marked to severe stenosis was encountered, M.D. overgrown bony elements along with hypertrophic ligamentum flavum was carefully removed, decompressing the underlying thecal sac and exiting nerve roots. We then proceeded with bilateral facetectomies at the L3-4, L4-5, and L5-S1 levels. Foraminotomies were performed so as to decompress the stenotic compression of the exiting L3, L4, L5, and S1 nerve roots.  We then proceeded with the discectomy bilaterally at each level. There was significant spondylitic degeneration of the disks at each level. At the L4-5 and L5-S1 levels the annulus was incised, and the disc space entered. Spondylitic overgrowth was removed using the osteophyte removal tool. A thorough discectomy was performed at each level. At L5-S1 we encountered a large central subligamentous disc herniation that was removed, decompressing the ventral aspect of the thecal sac. At each level that we then prepared the endplates, removing the cartilaginous surfaces. At the L3-4 level the annulus was incised, but it was difficult to enter the disc space. We removed a limited amount of disc posteriorly. Reviewing the x-ray images there was a bridging osteophyte  that we suspected limited the ability to open the disc space. However it was felt that good decompression had been achieved through the laminectomy, facetectomy,  and foraminotomy at this level.  The C-arm fluoroscope was then draped and brought into the field, and with C-arm fluoroscopic guidance we placed pedicle screws bilaterally at L3, L4, L5, and S1. Each screw hole was first probed, exam with a ball probe, good bony surfaces were found, each screw hole was tapped with a 5.25 mm tap, again examined with the ball probe. Good bony surfaces were found, and 5.75 x 40 mm screws were placed bilaterally at L3, L4, and L5, and 5.75 x 35 mm screws were placed bilaterally at S1.  After having probed the pedicle and vertebral body, and prior to placing screws, bone marrow was aspirated from the vertebral bodies and injected over 2 10 mL strips of Vitoss BA.  We then proceeded with the interbody arthrodesis at each level.  We selected a 8 x 25 x 4 AVS peek interbody implants for the L4-5 and L5-S1 levels.  We then packed the AVS peek interbody implants with Vitoss BA with bone marrow aspirate and infuse, and then placed the first implant at the L5-S1 level on the right side, carefully retracting the thecal sac and nerve root medially. We then went back to the left side and packed the midline with additional Vitoss BA with bone marrow aspirate and infuse, and then placed a second implant on the left side again retracting the thecal sac and nerve root medially. Additional Vitoss BA with bone marrow aspirate and infuse, was packed lateral to the implants.  Then at the L4-5 level, we placed the first implant on the right side, carefully retracting the thecal sac and nerve root medially. We then went back to the left side and packed the midline with additional Vitoss BA with bone marrow aspirate and infuse, and then placed a second implant on the left side, again retracting the thecal sac and nerve root medially.  Additional Vitoss BA with bone marrow aspirate and infuse was packed lateral to the implants.   At the L3-4 level Vitoss BA with bone marrow aspirate packed the posterior aspect the disc space.  We then packed the lateral gutter over the transverse processes and intertransverse space with locally harvested morcellized autograft, Vitoss BA with bone marrow aspirate, and infuse. We then selected pre-lordosed 80 mm rods, they were placed within the screw heads and secured with locking caps once all 8 locking caps were placed final tightening was performed against a counter torque.  We then placed a medium cross-link, securing it to the rods on either side between the screws at L4 and L5. Once it was locked in each rod, the central lock was tightened down.  The wound had been irrigated multiple times during the procedure with saline solution and bacitracin solution, good hemostasis was established with a combination of bipolar cautery and Gelfoam with thrombin. A thin layer Surgifoam was applied. Once good hemostasis was confirmed we proceeded with closure paraspinal muscles deep fascia and Scarpa's fascia were closed with interrupted undyed 1 Vicryl sutures the subcutaneous and subcuticular closed with interrupted inverted 2-0 undyed Vicryl sutures the skin edges were approximated with Dermabond.  There wound was dressed with sterile gauze and Hypafix.  Following surgery the patient was turned back to the supine position to be reversed and the anesthetic extubated and transferred to the recovery room for further care.   PLAN OF CARE: Admit to inpatient   PATIENT DISPOSITION:  PACU - hemodynamically stable.   Delay start of Pharmacological VTE agent (>24hrs) due to surgical blood loss or  risk of bleeding:  yes

## 2016-11-19 NOTE — Anesthesia Postprocedure Evaluation (Signed)
Anesthesia Post Note  Patient: Judy [name]  Procedure(s) Performed: Procedure(s) (LRB): Lumbar three-Sacrum one  lumbar decompression; Lumbar three-four, Lumbar four-five,  Lumbar five-Sacrum one  Posterior Lumbar Interbody Fusion; Lumbar three-Sacrum one  Posterior Lumbar Arthrodesis (N/A)  Patient location during evaluation: PACU Anesthesia Type: General Level of consciousness: awake and alert and patient cooperative Pain management: pain level controlled Vital Signs Assessment: post-procedure vital signs reviewed and stable Respiratory status: spontaneous breathing and respiratory function stable Cardiovascular status: stable Anesthetic complications: no       Last Vitals:  Vitals:   11/19/16 0639 11/19/16 1445  BP: (!) 110/52 (!) 119/59  Pulse: 81 80  Resp: 20 10  Temp: 36.7 C 36.4 C    Last Pain:  Vitals:   11/19/16 1445  TempSrc:   PainSc: 0-No pain                 Adi Doro S

## 2016-11-19 NOTE — H&P (Signed)
Subjective: Patient is a 75 y.o. right-handed white female who is admitted for treatment of multilevel, multifactorial lumbar stenosis.  She presents with complaints of low back pain with pain extending into her lower extremities bilaterally, consistent with neurogenic claudication.  Patient's been treated nonsurgically since 2005 with spinal injections and NSAIDs. MRI scan shows progressive degeneration with worsening spinal stenosis contributed to by multilevel spondylolisthesis, spondylitic overgrowth, degenerative disc bulging, and disc herniations. She is admitted now for an L3-S1 decompressive lumbar laminectomy, an L3-4 L4-5 and L5-S1 posterior lumbar interbody arthrodesis with interbody implants and bone graft, and a L3-S1 posterior lateral arthrodesis with posterior instrumentation and bone graft. Notably she does have a history of urinary incontinence and is being followed by Dr. Gayla Doss from [facility]. He did try her on Vesicare, but did not help, and she discontinued it   Patient Active Problem List   Diagnosis Date Noted  . Pancreatitis 03/21/2016  . HTN (hypertension) 03/21/2016  . Anxiety   . Depression   . AKI (acute kidney injury) [facility])    Past Medical History:  Diagnosis Date  . Anxiety   . Complication of anesthesia 2003   gallbladder surgery-resp. arrest-stop operation and pt. went to ICU  . Depression   . Hypertension   . Pancreatitis, chronic ([place]) 03/2016  . Sleep apnea     Past Surgical History:  Procedure Laterality Date  . CHOLECYSTECTOMY    . COLONOSCOPY  2013    Prescriptions Prior to Admission  Medication Sig Dispense Refill Last Dose  . acetaminophen (TYLENOL) 500 MG tablet Take 1,000 mg by mouth every 6 (six) hours as needed for moderate pain or headache.   Past Week at Unknown time  . clorazepate (TRANXENE) 3.75 MG tablet Take 3.75 mg by mouth at bedtime.    11/18/2016 at Unknown time  . diltiazem (CARDIZEM CD) 240 MG 24 hr capsule Take  240 mg by mouth daily.    11/18/2016 at Unknown time  . imipramine (TOFRANIL) 50 MG tablet Take 50 mg by mouth at bedtime.   11/18/2016 at Unknown time  . lisinopril (PRINIVIL,ZESTRIL) 40 MG tablet Take 40 mg by mouth daily.   11/18/2016 at Unknown time  . NON FORMULARY 1 each by Other route See admin instructions. CPAP machine nightly     . OVER THE COUNTER MEDICATION Apply 1 application topically daily. OTC Yeast and Rash Cream     . pantoprazole (PROTONIX) 40 MG tablet Take 1 tablet (40 mg total) by mouth daily. 30 tablet 1 11/17/2016  . solifenacin (VESICARE) 5 MG tablet Take 5 mg by mouth daily.   Past Week at Unknown time  . spironolactone (ALDACTONE) 50 MG tablet Take 50 mg by mouth daily.    03/19/2016 at Unknown time  . ciprofloxacin (CIPRO) 250 MG tablet Take 1 tablet (250 mg total) by mouth 2 (two) times daily. For 3days (Patient not taking: Reported on 11/12/2016) 6 tablet 0 Completed Course at Unknown time  . ondansetron (ZOFRAN ODT) 4 MG disintegrating tablet Take 1 tablet (4 mg total) by mouth every 8 (eight) hours as needed for nausea or vomiting. (Patient not taking: Reported on 11/12/2016) 20 tablet 0 Completed Course at Unknown time  . UNABLE TO FIND This note is to excuse Ms.Athanas from work due to medical illness requiring hospitalization, ok to return to work 4/28 (Patient not taking: Reported on 11/12/2016) 1 each 0 Not Taking at Unknown time   Allergies  Allergen Reactions  . No Known Allergies  Social History  Substance Use Topics  . Smoking status: Never Smoker  . Smokeless tobacco: Never Used  . Alcohol use No    Family History  Problem Relation Age of Onset  . Dementia Mother   . AAA (abdominal aortic aneurysm) Father      Review of Systems A comprehensive review of systems was negative.  Objective: Vital signs in last 24 hours: Temp:  [98.1 F (36.7 C)] 98.1 F (36.7 C) (12/18 0639) Pulse Rate:  [81] 81 (12/18 0639) Resp:  [20] 20 (12/18 0639) BP:  (110)/(52) 110/52 (12/18 0639) SpO2:  [97 %] 97 % (12/18 0639)  EXAM: Patient is a well-developed well-nourished white female in no acute distress. Lungs are clear to auscultation , the patient has symmetrical respiratory excursion. Heart has a regular rate and rhythm normal S1 and S2 no murmur.   Abdomen is soft nontender nondistended bowel sounds are present. Extremity examination shows no clubbing cyanosis or edema. Motor examination shows 5 over 5 strength in the lower extremities including the iliopsoas quadriceps dorsiflexor extensor hallicus  longus and plantar flexor bilaterally. Sensation is intact to pinprick in the distal lower extremities. No pathologic reflexes are present. Patient has a normal gait and stance.   Data Review:CBC    Component Value Date/Time   WBC 10.8 (H) 11/12/2016 1334   RBC 4.36 11/12/2016 1334   HGB 13.4 11/12/2016 1334   HCT 40.5 11/12/2016 1334   PLT 331 11/12/2016 1334   MCV 92.9 11/12/2016 1334   MCH 30.7 11/12/2016 1334   MCHC 33.1 11/12/2016 1334   RDW 12.3 11/12/2016 1334   LYMPHSABS 1.1 03/16/2016 2347   MONOABS 0.6 03/16/2016 2347   EOSABS 0.1 03/16/2016 2347   BASOSABS 0.0 03/16/2016 2347                          BMET    Component Value Date/Time   NA 138 11/12/2016 1334   K 4.6 11/12/2016 1334   CL 104 11/12/2016 1334   CO2 25 11/12/2016 1334   GLUCOSE 111 (H) 11/12/2016 1334   BUN 18 11/12/2016 1334   CREATININE 1.61 (H) 11/12/2016 1334   CALCIUM 9.3 11/12/2016 1334   GFRNONAA 30 (L) 11/12/2016 1334   GFRAA 35 (L) 11/12/2016 1334     Assessment/Plan: Patient with worsening neurogenic claudication secondary to worsening multilevel multifactorial lumbar stenosis and is admitted now for a lumbar decompression and arthrodesis.  I've discussed with the patient the nature of his condition, the nature the surgical procedure, the typical length of surgery, hospital stay, and overall recuperation, the limitations postoperatively, and  risks of surgery. I discussed risks including risks of infection, bleeding, possibly need for transfusion, the risk of nerve root dysfunction with pain, weakness, numbness, or paresthesias, the risk of dural tear and CSF leakage and possible need for further surgery, the risk of failure of the arthrodesis and possibly for further surgery, the risk of anesthetic complications including myocardial infarction, stroke, pneumonia, and death. We discussed the need for postoperative immobilization in a lumbar brace. Understanding all this the patient does wish to proceed with surgery and is admitted for such.     Hosie Spangle, MD 11/19/2016 7:03 AM

## 2016-11-19 NOTE — Anesthesia Procedure Notes (Signed)
Procedure Name: Intubation Date/Time: 11/19/2016 7:40 AM Performed by: Oletta Lamas Pre-anesthesia Checklist: Patient identified, Emergency Drugs available, Suction available and Patient being monitored Patient Re-evaluated:Patient Re-evaluated prior to inductionOxygen Delivery Method: Circle System Utilized Preoxygenation: Pre-oxygenation with 100% oxygen Intubation Type: IV induction Ventilation: Mask ventilation without difficulty Laryngoscope Size: Glidescope and 3 Grade View: Grade I Tube type: Oral Tube size: 7.5 mm Number of attempts: 1 Airway Equipment and Method: Stylet and Oral airway Placement Confirmation: ETT inserted through vocal cords under direct vision,  positive ETCO2 and breath sounds checked- equal and bilateral Secured at: 22 cm Tube secured with: Tape Dental Injury: Teeth and Oropharynx as per pre-operative assessment  Difficulty Due To: Difficulty was anticipated and Difficult Airway- due to limited oral opening

## 2016-11-20 MED ORDER — CYCLOBENZAPRINE HCL 5 MG PO TABS: 5.0000 mg | ORAL_TABLET | Freq: Three times a day (TID) | ORAL | Status: DC | PRN

## 2016-11-20 NOTE — Evaluation (Signed)
Occupational Therapy Evaluation Patient Details Name: Judy [name] MRN: [id number] DOB: [birth year] Today's Date: 11/20/2016    History of Present Illness Pt s/p L3-S1 fusion   Clinical Impression   This 75 yo female admitted and underwent above presents to acute OT with deficits below (see OT problem list) thus affecting her PLOF of being totally independent including working full time. She will benefit from acute OT with follow up OT at SNF to get back to PLOF.    Follow Up Recommendations  SNF;Supervision/Assistance - 24 hour    Equipment Recommendations  None recommended by OT       Precautions / Restrictions Precautions Precautions: Back Required Braces or Orthoses: Spinal Brace Spinal Brace: Applied in sitting position Restrictions Weight Bearing Restrictions: No      Mobility Bed Mobility Overal bed mobility: Needs Assistance Bed Mobility: Rolling;Sidelying to Sit Rolling: Min assist Sidelying to sit: Mod assist       General bed mobility comments: VCs for sequencing  Transfers Overall transfer level: Needs assistance Equipment used: Rolling walker (2 wheeled) Transfers: Sit to/from Stand Sit to Stand: Min guard              Balance Overall balance assessment: Needs assistance Sitting-balance support: No upper extremity supported;Feet supported Sitting balance-Leahy Scale: Good     Standing balance support: Bilateral upper extremity supported Standing balance-Leahy Scale: Poor Standing balance comment: feels the need to hold onto something when up on her feet (shaky/unsteady)                            ADL Overall ADL's : Needs assistance/impaired Eating/Feeding: Independent;Sitting   Grooming: Min guard;Standing   Upper Body Bathing: Set up;Sitting   Lower Body Bathing: Maximal assistance (min guard A sit<>stand)   Upper Body Dressing : Set up;Sitting   Lower Body Dressing: Maximal assistance (min guard A sit<>stand)    Toilet Transfer: Min guard;Ambulation;RW;BSC (over toilet)       Tub/ Shower Transfer: Minimal assistance (min guard A sit<>stand)                     Pertinent Vitals/Pain Pain Assessment: No/denies pain     Hand Dominance Right   Extremity/Trunk Assessment Upper Extremity Assessment Upper Extremity Assessment: Overall WFL for tasks assessed   Lower Extremity Assessment Lower Extremity Assessment: Defer to PT evaluation       Communication Communication Communication: No difficulties   Cognition Arousal/Alertness: Awake/alert Behavior During Therapy: WFL for tasks assessed/performed Overall Cognitive Status: Within Functional Limits for tasks assessed                                Home Living Family/patient expects to be discharged to:: Skilled nursing facility                                        Prior Functioning/Environment Level of Independence: Independent        Comments: Has a rollator, tub bench, and 3n1 at home        OT Problem List: Decreased strength;Decreased range of motion;Impaired balance (sitting and/or standing);Decreased knowledge of use of DME or AE;Obesity;Decreased knowledge of precautions   OT Treatment/Interventions: Self-care/ADL training;Patient/family education;DME and/or AE instruction;Balance training    OT Goals(Current goals can be found  in the care plan section) Acute Rehab OT Goals Patient Stated Goal: to go to rehab to get independent and then home OT Goal Formulation: With patient Time For Goal Achievement: 11/27/16 Potential to Achieve Goals: Good  OT Frequency: Min 2X/week   Barriers to D/C: Decreased caregiver support             End of Session Equipment Utilized During Treatment: Gait belt;Rolling walker;Back brace  Activity Tolerance: Patient tolerated treatment well Patient left: in chair;with call bell/phone within reach   Time: 0755-0820 OT Time Calculation (min):  25 min Charges:  OT General Charges $OT Visit: 1 Procedure OT Evaluation $OT Eval Moderate Complexity: 1 Procedure OT Treatments $Self Care/Home Management : 8-22 mins G-Codes: OT G-codes **NOT FOR INPATIENT CLASS** Functional Limitation: Self care Self Care Current Status [id number]): At least 60 percent but less than 80 percent impaired, limited or restricted Self Care Goal Status [id number]): At least 20 percent but less than 40 percent impaired, limited or restricted  Almon Register [id number] 11/20/2016, 8:48 AM

## 2016-11-20 NOTE — Progress Notes (Signed)
Vitals:   11/19/16 2008 11/19/16 2341 11/20/16 0435 11/20/16 0749  BP: (!) 113/52 (!) 102/52 (!) 109/55 (!) 78/63  Pulse: 87 99 95 96  Resp: 18 18 18 18   Temp: 98.3 F (36.8 C) 98.5 F (36.9 C) 98.9 F (37.2 C) 98.3 F (36.8 C)  TempSrc: Oral Oral Oral Oral  SpO2: 95% 95% 94% 94%    Patient sitting up in chair at the bedside. Dressing clean and dry. Foley DC'd by nursing staff. Has been up and ambulating so far in the room with OT. About to work with PT. We'll consult social work regarding referral for rehabilitation at a skilled nursing facility. First choice is [facility], second choice is [facility]  Plan: Continue PT and OT. Continue progress through postoperative recovery. Refer to social work for SNF for rehabilitation.  Hosie Spangle, MD 11/20/2016, 8:31 AM

## 2016-11-20 NOTE — Clinical Social Work Note (Signed)
Clinical Social Work Assessment  Patient Details  Name: Judy [name] MRN: [id number] Date of Birth: 1941/11/04  Date of referral:  11/20/16               Reason for consult:  Facility Placement, Discharge Planning                Permission sought to share information with:  Facility Sport and exercise psychologist, Family Supports Permission granted to share information::  Yes, Verbal Permission Granted  Name::     Animal nutritionist::  [employer]  Relationship::  Friend  Contact Information:     Housing/Transportation Living arrangements for the past 2 months:  Single Family Home Source of Information:  Patient Patient Interpreter Needed:  None Criminal Activity/Legal Involvement Pertinent to Current Situation/Hospitalization:  No - Comment as needed Significant Relationships:  Friend, Other Family Members Lives with:  Self Do you feel safe going back to the place where you live?  Yes Need for family participation in patient care:  No (Coment)  Care giving concerns:  The patient reports that she lives alone and doesn't feel she can manage at home alone at this time.   Social Worker assessment / plan:  CSW met with the patient at bedside to complete assessment. The patient states that she plans to go to [facility] at discharge. CSW has contacted the facility and they are able to accept the patient whenever she is ready for discharge. SNF search/placement process explained thoroughly. CSW will assist with DC when appropriate.  Employment status:  Disabled (Comment on whether or not currently receiving Disability), Retired Nurse, adult PT Recommendations:  [place] / Referral to community resources:  [place]  Patient/Family's Response to care:  The patient appears happy with the care she has received and appreciates CSW's assistance.  Patient/Family's Understanding of and Emotional Response to Diagnosis, Current  Treatment, and Prognosis:  The patient appears to have a good understanding of the reason for her admission and her post DC needs. The patient appears to be coping well with this hospitalization.   Emotional Assessment Appearance:  Appears stated age Attitude/Demeanor/Rapport:  Other (Patient is appropriate and welcoming of CSW.) Affect (typically observed):  Accepting, Appropriate, Calm, Pleasant Orientation:  Oriented to Self, Oriented to Place, Oriented to  Time, Oriented to Situation Alcohol / Substance use:  Not Applicable Psych involvement (Current and /or in the community):  No (Comment)  Discharge Needs  Concerns to be addressed:  Care Coordination, Discharge Planning Concerns Readmission within the last 30 days:  No Current discharge risk:  Physical Impairment Barriers to Discharge:  Continued Medical Work up   Fredderick Phenix B, LCSW 11/20/2016, 4:03 PM

## 2016-11-20 NOTE — NC FL2 (Signed)
[place] LEVEL OF CARE SCREENING TOOL     IDENTIFICATION  Patient Name: Judy [name] Birthdate: 1941-03-22 Sex: female Admission Date (Current Location): 11/19/2016  [facility] and [place] Number:  Herbalist and Address:  [facility] [place] [place] [address] [place], [place] [address]      Provider Number: [id number]  Attending Physician Name and Address:  Jovita Gamma, MD  Relative Name and Phone Number:       Current Level of Care: Hospital Recommended Level of Care: [place] Prior Approval Number:    Date Approved/Denied:   PASRR Number: [id number]  Discharge Plan: SNF    Current Diagnoses: Patient Active Problem List   Diagnosis Date Noted  . Lumbar stenosis with neurogenic claudication 11/19/2016  . Pancreatitis 03/21/2016  . HTN (hypertension) 03/21/2016  . Anxiety   . Depression   . AKI (acute kidney injury) ([place])     Orientation RESPIRATION BLADDER Height & Weight     Self, Time, Situation, Place  Normal Continent Weight:   Height:     BEHAVIORAL SYMPTOMS/MOOD NEUROLOGICAL BOWEL NUTRITION STATUS   (NONE)  (NONE) Continent Diet (Regular)  AMBULATORY STATUS COMMUNICATION OF NEEDS Skin   Limited Assist Verbally Surgical wounds (Incision back)                       Personal Care Assistance Level of Assistance  Feeding, Dressing, Bathing Bathing Assistance: Limited assistance Feeding assistance: Independent Dressing Assistance: Limited assistance     Functional Limitations Info  Sight, Hearing, Speech Sight Info: Adequate Hearing Info: Adequate Speech Info: Adequate    SPECIAL CARE FACTORS FREQUENCY  PT (By licensed PT), OT (By licensed OT)     PT Frequency: 5/week OT Frequency: 5/week            Contractures Contractures Info: Not present    Additional Factors Info  Code Status, Allergies Code Status Info: Full Code Allergies Info: NKDA           Current  Medications (11/20/2016):  This is the current hospital active medication list Current Facility-Administered Medications  Medication Dose Route Frequency Provider Last Rate Last Dose  . 0.9 %  sodium chloride infusion  250 mL Intravenous Continuous Jovita Gamma, MD      . acetaminophen (TYLENOL) tablet 650 mg  650 mg Oral Q4H PRN Jovita Gamma, MD       Or  . acetaminophen (TYLENOL) suppository 650 mg  650 mg Rectal Q4H PRN Jovita Gamma, MD      . alum & mag hydroxide-simeth (MAALOX/MYLANTA) 200-200-20 MG/5ML suspension 30 mL  30 mL Oral Q6H PRN Jovita Gamma, MD      . bisacodyl (DULCOLAX) suppository 10 mg  10 mg Rectal Daily PRN Jovita Gamma, MD      . cyclobenzaprine (FLEXERIL) tablet 5 mg  5 mg Oral TID PRN Jovita Gamma, MD      . dextrose 5 %-0.45 % sodium chloride infusion   Intravenous Continuous Jovita Gamma, MD      . diltiazem (CARDIZEM CD) 24 hr capsule 240 mg  240 mg Oral Daily Jovita Gamma, MD   240 mg at 11/20/16 0956  . HYDROcodone-acetaminophen (NORCO/VICODIN) 5-325 MG per tablet 1-2 tablet  1-2 tablet Oral Q4H PRN Jovita Gamma, MD      . hydrOXYzine (ATARAX/VISTARIL) tablet 50 mg  50 mg Oral Q3H PRN Jovita Gamma, MD      . hydrOXYzine (VISTARIL) injection 50  mg  50 mg Intramuscular Q4H PRN Jovita Gamma, MD      . imipramine (TOFRANIL) tablet 50 mg  50 mg Oral QHS Jovita Gamma, MD   50 mg at 11/19/16 2051  . lisinopril (PRINIVIL,ZESTRIL) tablet 40 mg  40 mg Oral Daily Jovita Gamma, MD      . magnesium hydroxide (MILK OF MAGNESIA) suspension 30 mL  30 mL Oral Daily PRN Jovita Gamma, MD      . menthol-cetylpyridinium (CEPACOL) lozenge 3 mg  1 lozenge Oral PRN Jovita Gamma, MD       Or  . phenol (CHLORASEPTIC) mouth spray 1 spray  1 spray Mouth/Throat PRN Jovita Gamma, MD      . morphine 4 MG/ML injection 4-8 mg  4-8 mg Intramuscular Q3H PRN Jovita Gamma, MD      . ondansetron [facility]) injection 4-8 mg  4-8 mg Intravenous Q6H PRN  Jovita Gamma, MD      . ondansetron [facility]) tablet 4-8 mg  4-8 mg Oral Q6H PRN Jovita Gamma, MD      . oxyCODONE-acetaminophen (PERCOCET/ROXICET) 5-325 MG per tablet 1-2 tablet  1-2 tablet Oral Q4H PRN Jovita Gamma, MD   2 tablet at 11/20/16 0523  . pantoprazole (PROTONIX) EC tablet 40 mg  40 mg Oral Daily Jovita Gamma, MD   40 mg at 11/20/16 0956  . sodium chloride flush (NS) 0.9 % injection 3 mL  3 mL Intravenous Q12H Jovita Gamma, MD   3 mL at 11/19/16 2200  . sodium chloride flush (NS) 0.9 % injection 3 mL  3 mL Intravenous PRN Jovita Gamma, MD      . spironolactone (ALDACTONE) tablet 50 mg  50 mg Oral Daily Jovita Gamma, MD   50 mg at 11/20/16 0956  . zolpidem (AMBIEN) tablet 5 mg  5 mg Oral QHS PRN Jovita Gamma, MD   5 mg at 11/20/16 0205     Discharge Medications: Please see discharge summary for a list of discharge medications.  Relevant Imaging Results:  Relevant Lab Results:   Additional Information SSN: [id number]  Rigoberto Noel, LCSW

## 2016-11-20 NOTE — Clinical Social Work Placement (Signed)
   CLINICAL SOCIAL WORK PLACEMENT  NOTE  Date:  11/20/2016  Patient Details  Name: Judy [name] MRN: [id number] Date of Birth: 1941-10-22  Clinical Social Work is seeking post-discharge placement for this patient at the [place] level of care (*CSW will initial, date and re-position this form in  chart as items are completed):  Yes   Patient/family provided with [place] Work Department's list of facilities offering this level of care within the geographic area requested by the patient (or if unable, by the patient's family).  Yes   Patient/family informed of their freedom to choose among providers that offer the needed level of care, that participate in Medicare, Medicaid or managed care program needed by the patient, have an available bed and are willing to accept the patient.  Yes   Patient/family informed of [place] [facility]'s ownership interest in [facility] and [facility], as well as of the fact that they are under no obligation to receive care at these facilities.  PASRR submitted to EDS on 11/20/16     PASRR number received on 11/20/16     Existing PASRR number confirmed on       FL2 transmitted to all facilities in geographic area requested by pt/family on 11/20/16     FL2 transmitted to all facilities within larger geographic area on       Patient informed that his/her managed care company has contracts with or will negotiate with certain facilities, including the following:            Patient/family informed of bed offers received.  Patient chooses bed at       Physician recommends and patient chooses bed at      Patient to be transferred to   on  .  Patient to be transferred to facility by       Patient family notified on   of transfer.  Name of family member notified:        PHYSICIAN Please prepare priority discharge summary, including medications, Please prepare prescriptions, Please sign FL2     Additional  Comment:    _______________________________________________ Rigoberto Noel, LCSW 11/20/2016, 4:10 PM

## 2016-11-20 NOTE — Evaluation (Signed)
Physical Therapy Evaluation Patient Details Name: Judy [name] MRN: [id number] DOB: 02-21-1941 Today's Date: 11/20/2016   History of Present Illness  Pt s/p L3-S1 fusion  Clinical Impression  Patient is s/p above surgery resulting in the deficits listed below (see PT Problem List). Pt sleepy and shaky from pain medication but tolerating mobility well for first day after surgery. Patient will benefit from skilled PT to increase their independence and safety with mobility (while adhering to their precautions) to allow discharge to the venue listed below.     Follow Up Recommendations SNF;Supervision/Assistance - 24 hour    Equipment Recommendations   (TBD at next venue)    Recommendations for Other Services       Precautions / Restrictions Precautions Precautions: Back Precaution Booklet Issued: Yes (comment) Precaution Comments: pt sleepy from pain meds so had difficulty with recall from previous session with OT Required Braces or Orthoses: Spinal Brace Spinal Brace: Applied in sitting position (adjusting in standing) Restrictions Weight Bearing Restrictions: No      Mobility  Bed Mobility Overal bed mobility: Needs Assistance Bed Mobility: Rolling;Sidelying to Sit Rolling: Min assist Sidelying to sit: Mod assist       General bed mobility comments: pt up in chair upon PT arrival  Transfers Overall transfer level: Needs assistance Equipment used: Rolling walker (2 wheeled) Transfers: Sit to/from Stand Sit to Stand: Min guard         General transfer comment: increased time, v/c's for proper hand placement  Ambulation/Gait Ambulation/Gait assistance: Min guard;Min assist Ambulation Distance (Feet): 100 Feet Assistive device: Rolling walker (2 wheeled);None Gait Pattern/deviations: Step-to pattern;Decreased stride length;Wide base of support;Staggering left;Staggering right Gait velocity: slow Gait velocity interpretation: Below normal speed for  age/gender General Gait Details: attempted to ambulate without AD however pt reaching for things to hold onto and very unsteady requiring minA to maintain balance and stability. Pt given RW and pt min guard. Pt mildly shaky, had to stop after 100' due to pt report "my legs feel weak like they'll give out"  Stairs            Wheelchair Mobility    Modified Rankin (Stroke Patients Only)       Balance Overall balance assessment: Needs assistance Sitting-balance support: No upper extremity supported;Feet supported Sitting balance-Leahy Scale: Good     Standing balance support: Bilateral upper extremity supported Standing balance-Leahy Scale: Poor Standing balance comment: feels the need to hold onto something when up on her feet (shaky/unsteady)                             Pertinent Vitals/Pain Pain Assessment: No/denies pain    Home Living Family/patient expects to be discharged to:: Skilled nursing facility                 Additional Comments: pt lives alone, works at [employer] at [employer]    Prior Function Level of Independence: Independent         Comments: Has a rollator, tub bench, and 3n1 at home     Hand Dominance   Dominant Hand: Right    Extremity/Trunk Assessment   Upper Extremity Assessment Upper Extremity Assessment: Overall WFL for tasks assessed    Lower Extremity Assessment Lower Extremity Assessment: Overall WFL for tasks assessed    Cervical / Trunk Assessment Cervical / Trunk Assessment: Other exceptions Cervical / Trunk Exceptions: back surgery  Communication   Communication: No difficulties  Cognition Arousal/Alertness:  Lethargic;Suspect due to medications (sleep from 2 percecets pt received earlier) Behavior During Therapy: [facility] for tasks assessed/performed Overall Cognitive Status: Within Functional Limits for tasks assessed                      General Comments      Exercises     Assessment/Plan     PT Assessment Patient needs continued PT services  PT Problem List Decreased strength;Decreased activity tolerance;Decreased balance;Decreased mobility          PT Treatment Interventions DME instruction;Gait training;Stair training;Functional mobility training;Therapeutic activities;Therapeutic exercise    PT Goals (Current goals can be found in the Care Plan section)  Acute Rehab PT Goals Patient Stated Goal: to go to rehab to get independent and then home PT Goal Formulation: With patient Time For Goal Achievement: 11/27/16 Potential to Achieve Goals: Good    Frequency Min 5X/week   Barriers to discharge Decreased caregiver support lives alone    Co-evaluation               End of Session Equipment Utilized During Treatment: Gait belt;Back brace Activity Tolerance: Patient tolerated treatment well Patient left: in chair;with call bell/phone within reach Nurse Communication: Mobility status    Functional Assessment Tool Used: clinical judgement Functional Limitation: Mobility: Walking and moving around Mobility: Walking and Moving Around Current Status [phone]): At least 40 percent but less than 60 percent impaired, limited or restricted Mobility: Walking and Moving Around Goal Status [phone]): At least 1 percent but less than 20 percent impaired, limited or restricted    Time: 0828-0849 PT Time Calculation (min) (ACUTE ONLY): 21 min   Charges:   PT Evaluation $PT Eval Moderate Complexity: 1 Procedure     PT G Codes:   PT G-Codes **NOT FOR INPATIENT CLASS** Functional Assessment Tool Used: clinical judgement Functional Limitation: Mobility: Walking and moving around Mobility: Walking and Moving Around Current Status [id number]): At least 40 percent but less than 60 percent impaired, limited or restricted Mobility: Walking and Moving Around Goal Status [phone]): At least 1 percent but less than 20 percent impaired, limited or restricted    Berline Lopes 11/20/2016, 9:03 AM  Kittie Plater, PT, DPT Pager #: [phone] Office #: [phone]

## 2016-11-21 DIAGNOSIS — D72828 Other elevated white blood cell count: Secondary | ICD-10-CM | POA: Diagnosis not present

## 2016-11-21 DIAGNOSIS — R2689 Other abnormalities of gait and mobility: Secondary | ICD-10-CM | POA: Diagnosis not present

## 2016-11-21 DIAGNOSIS — M545 Low back pain: Secondary | ICD-10-CM | POA: Diagnosis not present

## 2016-11-21 DIAGNOSIS — K5901 Slow transit constipation: Secondary | ICD-10-CM | POA: Diagnosis not present

## 2016-11-21 DIAGNOSIS — M62830 Muscle spasm of back: Secondary | ICD-10-CM | POA: Diagnosis not present

## 2016-11-21 DIAGNOSIS — N3281 Overactive bladder: Secondary | ICD-10-CM | POA: Diagnosis not present

## 2016-11-21 DIAGNOSIS — M5126 Other intervertebral disc displacement, lumbar region: Secondary | ICD-10-CM | POA: Diagnosis present

## 2016-11-21 DIAGNOSIS — G4730 Sleep apnea, unspecified: Secondary | ICD-10-CM | POA: Diagnosis present

## 2016-11-21 DIAGNOSIS — F419 Anxiety disorder, unspecified: Secondary | ICD-10-CM | POA: Diagnosis present

## 2016-11-21 DIAGNOSIS — R2681 Unsteadiness on feet: Secondary | ICD-10-CM | POA: Diagnosis not present

## 2016-11-21 DIAGNOSIS — M4326 Fusion of spine, lumbar region: Secondary | ICD-10-CM | POA: Diagnosis not present

## 2016-11-21 DIAGNOSIS — K219 Gastro-esophageal reflux disease without esophagitis: Secondary | ICD-10-CM | POA: Diagnosis not present

## 2016-11-21 DIAGNOSIS — M48062 Spinal stenosis, lumbar region with neurogenic claudication: Secondary | ICD-10-CM | POA: Diagnosis not present

## 2016-11-21 DIAGNOSIS — Z79899 Other long term (current) drug therapy: Secondary | ICD-10-CM | POA: Diagnosis not present

## 2016-11-21 DIAGNOSIS — M6281 Muscle weakness (generalized): Secondary | ICD-10-CM | POA: Diagnosis not present

## 2016-11-21 DIAGNOSIS — Z4889 Encounter for other specified surgical aftercare: Secondary | ICD-10-CM | POA: Diagnosis not present

## 2016-11-21 DIAGNOSIS — M47816 Spondylosis without myelopathy or radiculopathy, lumbar region: Secondary | ICD-10-CM | POA: Diagnosis present

## 2016-11-21 DIAGNOSIS — M4316 Spondylolisthesis, lumbar region: Secondary | ICD-10-CM | POA: Diagnosis present

## 2016-11-21 DIAGNOSIS — I10 Essential (primary) hypertension: Secondary | ICD-10-CM | POA: Diagnosis not present

## 2016-11-21 DIAGNOSIS — F329 Major depressive disorder, single episode, unspecified: Secondary | ICD-10-CM | POA: Diagnosis present

## 2016-11-21 DIAGNOSIS — R29898 Other symptoms and signs involving the musculoskeletal system: Secondary | ICD-10-CM | POA: Diagnosis not present

## 2016-11-21 DIAGNOSIS — M5136 Other intervertebral disc degeneration, lumbar region: Secondary | ICD-10-CM | POA: Diagnosis present

## 2016-11-21 DIAGNOSIS — G4733 Obstructive sleep apnea (adult) (pediatric): Secondary | ICD-10-CM | POA: Diagnosis not present

## 2016-11-21 MED ORDER — HYDROCODONE-ACETAMINOPHEN 5-325 MG PO TABS: 1.0000 | ORAL_TABLET | ORAL | 0 refills | Status: DC | PRN

## 2016-11-21 NOTE — Discharge Summary (Signed)
Physician Discharge Summary  Patient ID: Judy [name] MRN: [id number] DOB/AGE: Jan 20, 1941 75 y.o.  Admit date: 11/19/2016 Discharge date: 11/21/2016  Admission Diagnoses:  Multilevel, multifactorial lumbar stenosis with neurogenic claudication; multilevel dynamic lumbar spondylolisthesis; lumbar HNP; lumbar spondylosis; lumbar degenerative disc disease  Discharge Diagnoses:  Multilevel, multifactorial lumbar stenosis with neurogenic claudication; multilevel dynamic lumbar spondylolisthesis; lumbar HNP; lumbar spondylosis; lumbar degenerative disc disease Active Problems:   Lumbar stenosis with neurogenic claudication   Discharged Condition: good  Hospital Course: Patient was admitted, underwent an L3-S1 decompressive lumbar laminectomy, an L3-4, L4-5, and L5-S1 posterior lumbar interbody arthrodesis, and a L3-S1 posterior lateral arthrodesis with posterior instrumentation and bone graft. She is done well following surgery, with excellent relief of her disabling neurogenic claudication. She has been working with PT and OT, and has made good progress with transfers, ambulation, ADLs, and so on. Social work consultation was requested for referral to a skilled nursing facility for rehabilitation following her lumbar fusion. She will continue physical therapy at the skilled nursing facility. Her dressing was removed, and her incision is healing nicely. There is no erythema, swelling, or drainage.  She has been given instructions regarding wound care and activities following discharge. She is to shower each day, the wound is to be left open to air, and not covered for the showers. She is to ambulate in the halls at least 6 times per day. She is scheduled for follow-up visit with me, with x-rays at the office, on January 9.  Discharge Exam: Blood pressure (!) 102/51, pulse 90, temperature 98.7 F (37.1 C), temperature source Oral, resp. rate 18, SpO2 95 %.  Disposition:  Skilled nursing facility  for rehabilitation   Allergies as of 11/21/2016      Reactions   No Known Allergies       Medication List    STOP taking these medications   ciprofloxacin 250 MG tablet Commonly known as:  CIPRO   ondansetron 4 MG disintegrating tablet Commonly known as:  ZOFRAN ODT   UNABLE TO FIND     TAKE these medications   acetaminophen 500 MG tablet Commonly known as:  TYLENOL Take 1,000 mg by mouth every 6 (six) hours as needed for moderate pain or headache.   clorazepate 3.75 MG tablet Commonly known as:  TRANXENE Take 3.75 mg by mouth at bedtime.   diltiazem 240 MG 24 hr capsule Commonly known as:  CARDIZEM CD Take 240 mg by mouth daily.   HYDROcodone-acetaminophen 5-325 MG tablet Commonly known as:  NORCO/VICODIN Take 1-2 tablets by mouth every 4 (four) hours as needed (mild pain).   imipramine 50 MG tablet Commonly known as:  TOFRANIL Take 50 mg by mouth at bedtime.   lisinopril 40 MG tablet Commonly known as:  PRINIVIL,ZESTRIL Take 40 mg by mouth daily.   NON FORMULARY 1 each by Other route See admin instructions. CPAP machine nightly   OVER THE COUNTER MEDICATION Apply 1 application topically daily. OTC Yeast and Rash Cream   pantoprazole 40 MG tablet Commonly known as:  PROTONIX Take 1 tablet (40 mg total) by mouth daily.   solifenacin 5 MG tablet Commonly known as:  VESICARE Take 5 mg by mouth daily.   spironolactone 50 MG tablet Commonly known as:  ALDACTONE Take 50 mg by mouth daily.        SignedHosie Spangle 11/21/2016, 6:47 AM

## 2016-11-21 NOTE — Progress Notes (Signed)
Pt given D/C instructions, verbal understanding was provided. Rx's and paperwork were sent via ambulance. Pt's incision is open to air and has no sign of infection. Pt's IV was removed prior to D/C. Attempted to call report x 2, left message to call. No response from [facility]. Pt D/C'd to SNF via ambulance @ 1335 per MD order. Pt is stable @ D/C and has no other needs at this time. Holli Humbles, RN

## 2016-11-21 NOTE — Progress Notes (Signed)
Physical Therapy Treatment Patient Details Name: Judy [name] MRN: [id number] DOB: May 09, 1941 Today's Date: 11/21/2016    History of Present Illness Pt s/p L3-S1 fusion    PT Comments    Patient progressing towards PT goals. Ambulated in hall with assist. Educated on car transfer and activity expectations. Will continue to see as indicated. SNF remains appropriate.  Follow Up Recommendations  SNF;Supervision/Assistance - 24 hour     Equipment Recommendations   (TBD at next venue)    Recommendations for Other Services       Precautions / Restrictions Precautions Precautions: Back Precaution Booklet Issued: Yes (comment) Precaution Comments: pt sleepy from pain meds so had difficulty with recall from previous session with OT Required Braces or Orthoses: Spinal Brace Spinal Brace: Applied in sitting position (adjusting in standing) Restrictions Weight Bearing Restrictions: No    Mobility  Bed Mobility Overal bed mobility: Needs Assistance Bed Mobility: Rolling;Sit to Sidelying Rolling: Min assist Sidelying to sit: Min assist       General bed mobility comments: Assist to elevate LEs and reposition in bed  Transfers Overall transfer level: Needs assistance Equipment used: Rolling walker (2 wheeled) Transfers: Sit to/from Stand Sit to Stand: Min guard         General transfer comment: increased time, v/c's for proper hand placement  Ambulation/Gait Ambulation/Gait assistance: Min assist Ambulation Distance (Feet): 110 Feet Assistive device: 1 person hand held assist Gait Pattern/deviations: Step-to pattern;Decreased stride length;Wide base of support;Staggering left;Staggering right Gait velocity: slow Gait velocity interpretation: Below normal speed for age/gender General Gait Details: Ambulated without RW but required increased physical assist and 3 standing rest breaks. HHA for stability. Increased lateral sway noted   Stairs             Wheelchair Mobility    Modified Rankin (Stroke Patients Only)       Balance Overall balance assessment: Needs assistance Sitting-balance support: No upper extremity supported;Feet supported Sitting balance-Leahy Scale: Good     Standing balance support: Bilateral upper extremity supported Standing balance-Leahy Scale: Poor Standing balance comment: continues to required UE support for balance                    Cognition Arousal/Alertness: Lethargic;Suspect due to medications (sleep from 2 percecets pt received earlier) Behavior During Therapy: [facility] for tasks assessed/performed Overall Cognitive Status: Within Functional Limits for tasks assessed                      Exercises      General Comments        Pertinent Vitals/Pain Pain Assessment: Faces Pain Score: 4  Pain Location: surgical site Pain Descriptors / Indicators: Guarding;Operative site guarding;Sore Pain Intervention(s): Monitored during session    Home Living                      Prior Function            PT Goals (current goals can now be found in the care plan section) Acute Rehab PT Goals Patient Stated Goal: to go to rehab to get independent and then home PT Goal Formulation: With patient Time For Goal Achievement: 11/27/16 Potential to Achieve Goals: Good Progress towards PT goals: Progressing toward goals    Frequency    Min 5X/week      PT Plan Current plan remains appropriate    Co-evaluation             End of  Session Equipment Utilized During Treatment: Gait belt;Back brace Activity Tolerance: Patient tolerated treatment well Patient left: in bed;with call bell/phone within reach     Time: 0923-0945 PT Time Calculation (min) (ACUTE ONLY): 22 min  Charges:  $Gait Training: 8-22 mins                    G Codes:      Judy [name], Judy [name], Judy [name], PT DPT  [phone]

## 2016-11-21 NOTE — Progress Notes (Signed)
Patient will DC to: [facility] Anticipated DC date: 11/21/16 Family notified: Friend Transport by: Veleta Miners   Per MD patient ready for DC to [facility]. RN, patient, patient's family, and facility notified of DC. Discharge Summary sent to facility. RN given number for report. DC packet on chart. Ambulance transport requested for patient.   CSW signing off.  Cedric Fishman, [place] Social Worker [phone]

## 2016-11-21 NOTE — Discharge Instructions (Signed)

## 2016-11-21 NOTE — Clinical Social Work Placement (Signed)
   CLINICAL SOCIAL WORK PLACEMENT  NOTE  Date:  11/21/2016  Patient Details  Name: Judy [name] MRN: [id number] Date of Birth: 1941/09/04  Clinical Social Work is seeking post-discharge placement for this patient at the [place] level of care (*CSW will initial, date and re-position this form in  chart as items are completed):  Yes   Patient/family provided with [facility] Work Department's list of facilities offering this level of care within the geographic area requested by the patient (or if unable, by the patient's family).  Yes   Patient/family informed of their freedom to choose among providers that offer the needed level of care, that participate in Medicare, Medicaid or managed care program needed by the patient, have an available bed and are willing to accept the patient.  Yes   Patient/family informed of 's ownership interest in [facility] and [facility], as well as of the fact that they are under no obligation to receive care at these facilities.  PASRR submitted to EDS on 11/20/16     PASRR number received on 11/20/16     Existing PASRR number confirmed on       FL2 transmitted to all facilities in geographic area requested by pt/family on 11/20/16     FL2 transmitted to all facilities within larger geographic area on       Patient informed that his/her managed care company has contracts with or will negotiate with certain facilities, including the following:        Yes   Patient/family informed of bed offers received.  Patient chooses bed at [facility]     Physician recommends and patient chooses bed at      Patient to be transferred to [facility] on 11/21/16.  Patient to be transferred to facility by [facility]     Patient family notified on 11/21/16 of transfer.  Name of family member notified:  Friend     PHYSICIAN Please prepare priority discharge summary, including medications, Please prepare  prescriptions, Please sign FL2     Additional Comment:    _______________________________________________ Benard Halsted, LCSWA 11/21/2016, 11:41 AM

## 2016-11-22 ENCOUNTER — Non-Acute Institutional Stay (SKILLED_NURSING_FACILITY): Payer: Medicare Other | Admitting: Adult Health

## 2016-11-22 ENCOUNTER — Encounter: Payer: Self-pay | Admitting: Adult Health

## 2016-11-22 DIAGNOSIS — N183 Chronic kidney disease, stage 3 unspecified: Secondary | ICD-10-CM

## 2016-11-22 DIAGNOSIS — K219 Gastro-esophageal reflux disease without esophagitis: Secondary | ICD-10-CM

## 2016-11-22 DIAGNOSIS — G4730 Sleep apnea, unspecified: Secondary | ICD-10-CM | POA: Diagnosis not present

## 2016-11-22 DIAGNOSIS — F329 Major depressive disorder, single episode, unspecified: Secondary | ICD-10-CM | POA: Diagnosis not present

## 2016-11-22 DIAGNOSIS — N3281 Overactive bladder: Secondary | ICD-10-CM | POA: Diagnosis not present

## 2016-11-22 DIAGNOSIS — I10 Essential (primary) hypertension: Secondary | ICD-10-CM | POA: Diagnosis not present

## 2016-11-22 DIAGNOSIS — R2681 Unsteadiness on feet: Secondary | ICD-10-CM | POA: Diagnosis not present

## 2016-11-22 DIAGNOSIS — F419 Anxiety disorder, unspecified: Secondary | ICD-10-CM | POA: Diagnosis not present

## 2016-11-22 DIAGNOSIS — M48062 Spinal stenosis, lumbar region with neurogenic claudication: Secondary | ICD-10-CM | POA: Diagnosis not present

## 2016-11-22 DIAGNOSIS — F32A Depression, unspecified: Secondary | ICD-10-CM

## 2016-11-22 MED FILL — Sodium Chloride IV Soln 0.9%: INTRAVENOUS | Qty: 2000 | Status: AC

## 2016-11-22 MED FILL — Heparin Sodium (Porcine) Inj 1000 Unit/ML: INTRAMUSCULAR | Qty: 30 | Status: AC

## 2016-11-22 NOTE — Progress Notes (Signed)
DATE:  11/22/2016   MRN:  [id number]  BIRTHDAY: September 20, 1941  Facility:  [facility]ursing Home Location:  [place] Room Number: 1206-P  LEVEL OF CARE:  SNF [phone])  Contact Information    Name Relation Home Work Bassett [phone]     Archie Balboa [phone]         Code Status History    Date Active Date Inactive Code Status Order ID Comments User Context   11/19/2016  6:04 PM 11/21/2016  5:02 PM Full Code [id number]  Jovita Gamma, MD Inpatient   03/21/2016  9:03 AM 03/24/2016  5:31 PM Full Code [id number]  Sid Falcon, MD Inpatient       Chief Complaint  Patient presents with  . Hospitalization Follow-up    HISTORY OF PRESENT ILLNESS:  This is a 75 year old female admitted to [facility] on 11/21/16 for short-term rehabilitation following an admission at [facility] 11/19/16-11/21/16 with Multilevel, multifactorial lumbar stenosis and neurogenic claudication, multilevel dynamic lumbar spondylolisthesis, lumbar HNP, lumbar spondylolisthesis, lumbar degenerative disc disease S/P L3-S1 decompressive lumbar laminectomy, L3-4, L4-5, L5-S1 posterior lumbar interbody arthrodesis, and a L3-S1 posterior lateral arthrodesis with posterior instrumentation and bone graft.     PAST MEDICAL HISTORY:  Past Medical History:  Diagnosis Date  . Anxiety   . Complication of anesthesia 2003   gallbladder surgery-resp. arrest-stop operation and pt. went to ICU  . Depression   . Hypertension   . Pancreatitis, chronic ([place]) 03/2016  . Sleep apnea      CURRENT MEDICATIONS: Reviewed  Patient's Medications  New Prescriptions   No medications on file  Previous Medications   ACETAMINOPHEN (TYLENOL) 500 MG TABLET    Take 1,000 mg by mouth every 6 (six) hours as needed for moderate pain or headache.   CLORAZEPATE (TRANXENE) 3.75 MG TABLET    Take 3.75 mg by mouth at bedtime.    DILTIAZEM (CARDIZEM CD) 240 MG 24 HR  CAPSULE    Take 240 mg by mouth daily.    HYDROCODONE-ACETAMINOPHEN (NORCO/VICODIN) 5-325 MG TABLET    Take 1-2 tablets by mouth every 4 (four) hours as needed (mild pain).   IMIPRAMINE (TOFRANIL) 50 MG TABLET    Take 50 mg by mouth at bedtime.   LISINOPRIL (PRINIVIL,ZESTRIL) 40 MG TABLET    Take 40 mg by mouth daily.   NON FORMULARY    1 each by Other route See admin instructions. CPAP machine nightly   OVER THE COUNTER MEDICATION    Apply 1 application topically daily. OTC Yeast and Rash Cream   PANTOPRAZOLE (PROTONIX) 40 MG TABLET    Take 1 tablet (40 mg total) by mouth daily.   SOLIFENACIN (VESICARE) 5 MG TABLET    Take 5 mg by mouth daily.   SPIRONOLACTONE (ALDACTONE) 50 MG TABLET    Take 50 mg by mouth daily.   Modified Medications   No medications on file  Discontinued Medications   No medications on file     Allergies  Allergen Reactions  . No Known Allergies      REVIEW OF SYSTEMS:  GENERAL: no change in appetite, no fatigue, no weight changes, no fever, chills or weakness SKIN: Denies rash, itching, wounds, ulcer sores, or nail abnormality EYES: Denies change in vision, dry eyes, eye pain, itching or discharge EARS: Denies change in hearing, ringing in ears, or earache NOSE: Denies nasal congestion or epistaxis MOUTH and THROAT: Denies oral discomfort, gingival pain  or bleeding, pain from teeth or hoarseness   RESPIRATORY: no cough, SOB, DOE, wheezing, hemoptysis CARDIAC: no chest pain, edema or palpitations GI: no abdominal pain, diarrhea, constipation, heart burn, nausea or vomiting GU: Denies dysuria, frequency, hematuria, incontinence, or discharge PSYCHIATRIC: Denies feeling of depression or anxiety. No report of hallucinations, insomnia, paranoia, or agitation     PHYSICAL EXAMINATION  GENERAL APPEARANCE: Well nourished. In no acute distress. Normal body habitus SKIN:  Has midline lower back surgical incision dry, no erythema HEAD: Normal in size and  contour. No evidence of trauma EYES: Lids open and close normally. No blepharitis, entropion or ectropion. PERRL. Conjunctivae are clear and sclerae are white. Lenses are without opacity EARS: Pinnae are normal. Patient hears normal voice tunes of the examiner MOUTH and THROAT: Lips are without lesions. Oral mucosa is moist and without lesions. Tongue is normal in shape, size, and color and without lesions NECK: supple, trachea midline, no neck masses, no thyroid tenderness, no thyromegaly LYMPHATICS: no LAN in the neck, no supraclavicular LAN RESPIRATORY: breathing is even & unlabored, BS CTAB CARDIAC: RRR, no murmur,no extra heart sounds, no edema GI: abdomen soft, normal BS, no masses, no tenderness, no hepatomegaly, no splenomegaly EXTREMITIES:  Able to move 4 extremities; has lumbar belt PSYCHIATRIC: Alert and oriented X 3. Affect and behavior are appropriate    LABS/RADIOLOGY: Labs reviewed: Basic Metabolic Panel:  Recent Labs  03/23/16 0345 03/24/16 0351 11/12/16 1334  NA 136 140 138  K 4.5 4.7 4.6  CL 111 112* 104  CO2 16* 22 25  GLUCOSE 136* 94 111*  BUN 22* 22* 18  CREATININE 1.40* 1.39* 1.61*  CALCIUM 8.3* 8.6* 9.3   Liver Function Tests:  Recent Labs  03/21/16 0025 03/22/16 0403 03/24/16 0351  AST 18 14* 13*  ALT 17 16 16   ALKPHOS 91 71 81  BILITOT 0.3 0.2* <0.1*  PROT 8.2* 5.9* 5.9*  ALBUMIN 4.6 3.2* 3.3*    Recent Labs  03/16/16 2347 03/21/16 0025 03/23/16 0345  LIPASE 28 93* 102*   CBC:  Recent Labs  03/16/16 2347  03/23/16 0345 03/24/16 0351 11/12/16 1334  WBC 17.4*  < > 6.2 7.5 10.8*  NEUTROABS 15.5*  --   --   --   --   HGB 13.3  < > 10.5* 10.1* 13.4  HCT 39.9  < > 31.3* 29.5* 40.5  MCV 91.9  < > 91.3 91.0 92.9  PLT 345  < > 275 279 331  < > = values in this interval not displayed. Cardiac Enzymes:  Recent Labs  03/21/16 0900 03/21/16 1549  TROPONINI <0.03 <0.03     Dg Lumbar Spine 2-3 Views  Result Date:  11/19/2016 CLINICAL DATA:  Posterior lumbar interbody fusion. EXAM: LUMBAR SPINE - 2-3 VIEW COMPARISON:  MRI 10/05/2016 FINDINGS: Two fluoroscopic images demonstrate bilateral pedicle screws at L3, L4, L5 and S1. Interbody devices at L4-L5 and L5-S1. IMPRESSION: Lumbar spine surgery involving L3, L4, L5 and S1. Electronically Signed   By: Markus Daft M.D.   On: 11/19/2016 14:09   Dg Lumbar Spine 2-3 Views  Result Date: 11/19/2016 CLINICAL DATA:  Lumbar spine surgery. EXAM: LUMBAR SPINE - 2-3 VIEW COMPARISON:  MRI 10/05/2016. FINDINGS: Lumbar vertebrae numbered as per our MRI . On image number 1 metallic markers noted posteriorly at L2 and L5. On image number 2 metallic markers noted at the L4-L5 and L5-S1 disc space level. IMPRESSION: Intraoperative lumbar spine as above . Electronically Signed   By:  Elbert   On: 11/19/2016 09:57   Dg C-arm 1-60 Min  Result Date: 11/19/2016 CLINICAL DATA:  Posterior lumbar interbody fusion EXAM: DG C-ARM 61-120 MIN COMPARISON:  11/19/2016 FINDINGS: Two low resolution intraoperative spot films of the lumbar spine are submitted. The images were obtained during posterior lumbar interbody fusion from L3 through S1 with interbody devices present at L4-L5 and L5-S1. Total fluoroscopy time was 1 minutes 14 second IMPRESSION: Intraoperative fluoroscopic assistance provided during posterior lumbar interbody fusion. Electronically Signed   By: Donavan Foil M.D.   On: 11/19/2016 17:40    ASSESSMENT/PLAN:  Unsteady gait - for rehabilitation, PT and OT, for therapeutic strengthening exercises; fall precautions  Lumbar stenosis with neurogenic claudication S/P L3-S1 decompressive lumbar laminectomy, L3-4, L4-5 and L5-S1 posterior lumbar interbody arthrodesis and L3-S1 posterior lateral arthrodesis with posterior instrumentation and bone graft - for rehabilitation, PT and OT, for therapeutic strengthening exercises; follow-up with neurosurgeon, Dr. Sherwood Gambler on  12/12/2015; continue acetaminophen 500 mg take 2 tabs = 1000 mg by mouth every 6 hours when necessary and Norco 5/325 mg 1-2 tabs by mouth every 4 hours when necessary for pain; check CBC  Hypertension - continue diltiazem 240 mg 24 hour capsule 1 capsule by mouth daily, Spiriva and Aldactone 50 mg 1 tab by mouth daily and lisinopril 40 mg 1 tab by mouth daily  GERD - continue Protonix 40 mg 1 tab by mouth daily  Overactive bladder - continue Vesicare 5 mg 1 tab by mouth daily  Anxiety - continue clorazepate 3.75 mg 1 tab by mouth daily at bedtime  Depression - continue Tofranil 50 mg 1 tab by mouth daily at bedtime  Sleep apnea - continue CPAP at at bedtime  Chronic kidney disease, stage III - GFR 30; check BMP Lab Results  Component Value Date   CREATININE 1.61 (H) 11/12/2016      Goals of care:  Short-term rehabilitation    Keyundra Fant C. [place] - NP [employer] [phone]

## 2016-11-23 ENCOUNTER — Non-Acute Institutional Stay (SKILLED_NURSING_FACILITY): Payer: Medicare Other | Admitting: Internal Medicine

## 2016-11-23 ENCOUNTER — Encounter: Payer: Self-pay | Admitting: Internal Medicine

## 2016-11-23 DIAGNOSIS — G4733 Obstructive sleep apnea (adult) (pediatric): Secondary | ICD-10-CM

## 2016-11-23 DIAGNOSIS — K219 Gastro-esophageal reflux disease without esophagitis: Secondary | ICD-10-CM

## 2016-11-23 DIAGNOSIS — D72828 Other elevated white blood cell count: Secondary | ICD-10-CM

## 2016-11-23 DIAGNOSIS — K5901 Slow transit constipation: Secondary | ICD-10-CM

## 2016-11-23 DIAGNOSIS — M62830 Muscle spasm of back: Secondary | ICD-10-CM

## 2016-11-23 DIAGNOSIS — N3281 Overactive bladder: Secondary | ICD-10-CM | POA: Diagnosis not present

## 2016-11-23 DIAGNOSIS — M48062 Spinal stenosis, lumbar region with neurogenic claudication: Secondary | ICD-10-CM | POA: Diagnosis not present

## 2016-11-23 DIAGNOSIS — R29898 Other symptoms and signs involving the musculoskeletal system: Secondary | ICD-10-CM | POA: Diagnosis not present

## 2016-11-23 DIAGNOSIS — N179 Acute kidney failure, unspecified: Secondary | ICD-10-CM

## 2016-11-23 LAB — BASIC METABOLIC PANEL
BUN: 40 mg/dL — AB (ref 4–21)
Creatinine: 1.4 mg/dL — AB (ref 0.5–1.1)
Glucose: 129 mg/dL
Potassium: 5.8 mmol/L — AB (ref 3.4–5.3)
Sodium: 140 mmol/L (ref 137–147)

## 2016-11-23 LAB — CBC AND DIFFERENTIAL
HCT: 26 % — AB (ref 36–46)
HEMOGLOBIN: 8.4 g/dL — AB (ref 12.0–16.0)
NEUTROS ABS: 10 /uL
PLATELETS: 303 10*3/uL (ref 150–399)
WBC: 13.3 10*3/mL

## 2016-11-23 NOTE — Progress Notes (Signed)
LOCATION: [place]  PCP: No primary care provider on file.   Code Status: Full Code  Goals of care: Advanced Directive information Advanced Directives 11/19/2016  Does Patient Have a Medical Advance Directive? No  Would patient like information on creating a medical advance directive? (No Data)       Extended Emergency Contact Information Primary Emergency Contact: Newcomb,Juanita Address: [address] [place] Phone: [phone] Relation: Aunt Secondary Emergency Contact: Salli Quarry [place] Phone: [phone] Relation: Other   Allergies  Allergen Reactions  . No Known Allergies     Chief Complaint  Patient presents with  . New Admit To SNF    New Admission Visit      HPI:  Patient is a 75 y.o. female seen today for short term rehabilitation post hospital admission from 11/19/2016-11/21/2016 with multilevel, multifactorial lumbar stenosis with neurogenic claudication. She underwent L3-S1 decompressive lumbar laminectomy, L3-4, L4-5, and L5-S1 posterior lumbar interbody arthrodesis and L3-S1 posterior lateral arthrodesis with posterior instrumentation and bone graft. She is seen in her room today. Her pain has improved post surgery and her numbness and tingling have resolved. She still has some weakness in her legs.  Review of Systems:  Constitutional: Negative for fever, chills, diaphoresis. Feels weak and tired. HENT: Negative for headache, congestion, nasal discharge Eyes: Negative for blurred vision, double vision and discharge. Wears glasses.   Respiratory: Negative for cough, shortness of breath and wheezing.   Cardiovascular: Negative for chest pain, palpitations, leg swelling.  Gastrointestinal: Negative for heartburn, nausea, vomiting, abdominal pain, loss of appetite, melena, diarrhea and constipation. Last bowel movement was last night.   Genitourinary: Negative for dysuria.    Musculoskeletal: Negative for fall in the facility.  positive for right-sided lower back pain. Skin: Negative for itching, rash.  Neurological: Negative for dizziness. Psychiatric/Behavioral: Negative for depression   Past Medical History:  Diagnosis Date  . Anxiety   . Complication of anesthesia 2003   gallbladder surgery-resp. arrest-stop operation and pt. went to ICU  . Depression   . Hypertension   . Pancreatitis, chronic ([place]) 03/2016  . Sleep apnea    Past Surgical History:  Procedure Laterality Date  . CHOLECYSTECTOMY    . COLONOSCOPY  2013   Social History:   reports that she has never smoked. She has never used smokeless tobacco. She reports that she does not drink alcohol or use drugs.  Family History  Problem Relation Age of Onset  . Dementia Mother   . AAA (abdominal aortic aneurysm) Father     Medications: Allergies as of 11/23/2016      Reactions   No Known Allergies       Medication List       Accurate as of 11/23/16  3:08 PM. Always use your most recent med list.          acetaminophen 500 MG tablet Commonly known as:  TYLENOL Take 1,000 mg by mouth every 6 (six) hours as needed for moderate pain or headache.   clorazepate 3.75 MG tablet Commonly known as:  TRANXENE Take 3.75 mg by mouth at bedtime.   diltiazem 240 MG 24 hr capsule Commonly known as:  CARDIZEM CD Take 240 mg by mouth daily.   HYDROcodone-acetaminophen 5-325 MG tablet Commonly known as:  NORCO/VICODIN Take 1-2 tablets by mouth every 4 (four) hours as needed (mild pain).   imipramine 50 MG tablet Commonly known  as:  TOFRANIL Take 50 mg by mouth at bedtime.   lisinopril 40 MG tablet Commonly known as:  PRINIVIL,ZESTRIL Take 40 mg by mouth daily.   NON FORMULARY 1 each by Other route See admin instructions. CPAP machine nightly   pantoprazole 40 MG tablet Commonly known as:  PROTONIX Take 1 tablet (40 mg total) by mouth daily.   solifenacin 5 MG tablet Commonly  known as:  VESICARE Take 5 mg by mouth daily.   spironolactone 50 MG tablet Commonly known as:  ALDACTONE Take 50 mg by mouth daily.       Immunizations:  There is no immunization history on file for this patient.   Physical Exam: Vitals:   11/23/16 1505  BP: (!) 118/56  Pulse: 92  Resp: 20  Temp: 97.7 F (36.5 C)  TempSrc: Oral  SpO2: 94%  Weight: 175 lb (79.4 kg)  Height: 5' (1.524 m)   Body mass index is 34.18 kg/m.  General- elderly female, obese, in no acute distress Head- normocephalic, atraumatic Nose- no maxillary or frontal sinus tenderness, no nasal discharge Throat- moist mucus membrane  Eyes- PERRLA, EOMI, no pallor, no icterus Neck- no cervical lymphadenopathy Cardiovascular- normal s1,s2, no murmur Respiratory- bilateral clear to auscultation, no wheeze, no rhonchi, no crackles, no use of accessory muscles Abdomen- bowel sounds present, soft, non tender Musculoskeletal- able to move all 4 extremities, back brace in place, trace leg edema Neurological- no focal deficit, alert and oriented to person, place and time Skin- warm and dry, surgical incision to lumbar region with through an internal sutures, some bruising around the incision, no drainage or signs of infection. Psychiatry- normal mood and affect    Labs reviewed: Basic Metabolic Panel:  Recent Labs  03/23/16 0345 03/24/16 0351 11/12/16 1334  NA 136 140 138  K 4.5 4.7 4.6  CL 111 112* 104  CO2 16* 22 25  GLUCOSE 136* 94 111*  BUN 22* 22* 18  CREATININE 1.40* 1.39* 1.61*  CALCIUM 8.3* 8.6* 9.3   Liver Function Tests:  Recent Labs  03/21/16 0025 03/22/16 0403 03/24/16 0351  AST 18 14* 13*  ALT 17 16 16   ALKPHOS 91 71 81  BILITOT 0.3 0.2* <0.1*  PROT 8.2* 5.9* 5.9*  ALBUMIN 4.6 3.2* 3.3*    Recent Labs  03/16/16 2347 03/21/16 0025 03/23/16 0345  LIPASE 28 93* 102*   No results for input(s): AMMONIA in the last 8760 hours. CBC:  Recent Labs  03/16/16 2347   03/23/16 0345 03/24/16 0351 11/12/16 1334  WBC 17.4*  < > 6.2 7.5 10.8*  NEUTROABS 15.5*  --   --   --   --   HGB 13.3  < > 10.5* 10.1* 13.4  HCT 39.9  < > 31.3* 29.5* 40.5  MCV 91.9  < > 91.3 91.0 92.9  PLT 345  < > 275 279 331  < > = values in this interval not displayed. Cardiac Enzymes:  Recent Labs  03/21/16 0900 03/21/16 1549  TROPONINI <0.03 <0.03   BNP: Invalid input(s): POCBNP CBG: No results for input(s): GLUCAP in the last 8760 hours.  Radiological Exams: Dg Lumbar Spine 2-3 Views  Result Date: 11/19/2016 CLINICAL DATA:  Posterior lumbar interbody fusion. EXAM: LUMBAR SPINE - 2-3 VIEW COMPARISON:  MRI 10/05/2016 FINDINGS: Two fluoroscopic images demonstrate bilateral pedicle screws at L3, L4, L5 and S1. Interbody devices at L4-L5 and L5-S1. IMPRESSION: Lumbar spine surgery involving L3, L4, L5 and S1. Electronically Signed   By: Quita Skye  Anselm Pancoast M.D.   On: 11/19/2016 14:09   Dg Lumbar Spine 2-3 Views  Result Date: 11/19/2016 CLINICAL DATA:  Lumbar spine surgery. EXAM: LUMBAR SPINE - 2-3 VIEW COMPARISON:  MRI 10/05/2016. FINDINGS: Lumbar vertebrae numbered as per our MRI . On image number 1 metallic markers noted posteriorly at L2 and L5. On image number 2 metallic markers noted at the L4-L5 and L5-S1 disc space level. IMPRESSION: Intraoperative lumbar spine as above . Electronically Signed   By: Marcello Moores  Register   On: 11/19/2016 09:57   Dg C-arm 1-60 Min  Result Date: 11/19/2016 CLINICAL DATA:  Posterior lumbar interbody fusion EXAM: DG C-ARM 61-120 MIN COMPARISON:  11/19/2016 FINDINGS: Two low resolution intraoperative spot films of the lumbar spine are submitted. The images were obtained during posterior lumbar interbody fusion from L3 through S1 with interbody devices present at L4-L5 and L5-S1. Total fluoroscopy time was 1 minutes 14 second IMPRESSION: Intraoperative fluoroscopic assistance provided during posterior lumbar interbody fusion. Electronically Signed   By:  Donavan Foil M.D.   On: 11/19/2016 17:40    Assessment/Plan  Leg weakness With lumbar stenosis and neurogenic claudication. Status post decompressive surgery. Will have her work with physical therapy and occupational therapy to help regain her strength and balance.  Lumbar stenosis with neurogenic claudication Status post lumbosacral decompressive laminectomy and posterior lumbar arthrodesis. Will need patient to work with physical therapy and occupational therapy to help with gait training and strengthening exercises. Follow-up precautions to be taken. Continue Norco 5-3 25 mg 1-2 tabs every 4 hours as needed for pain. Will get PMR consult. To follow-up with his surgeon.  Back muscle spasticity Status post recent surgery. Start her on baclofen 5 mg every 12 hours as needed for spasm. PMR consult. To wear her back brace when out of bed.  Constipation  will have her on senna S2 tablets at bedtime and MiraLAX daily as needed. Encourage hydration.  Acute kidney injury Monitor BMP.  Mild leukocytosis No signs of infection. Likely reactive leukocytosis. Monitor WBC curve.  Hypertension Monitor blood pressure reading. Continue lisinopril 40 mg daily, diltiazem 240 mg daily and Aldactone 50 mg daily for now.  Overactive bladder Continue Vesicare.   Gastroesophageal reflux disease Symptoms are controlled. Continue Protonix for now.  Obstructive sleep apnea Continue CPAP at bedtime   Goals of care: short term rehabilitation   Labs/tests ordered: Pending CBC, BMP  Family/ staff Communication: reviewed care plan with patient and nursing supervisor    Blanchie Serve, MD Internal Medicine [place], [place] [address] Cell Phone (Monday-Friday 8 am - 5 pm): [phone] On Call: [phone] and follow prompts after 5 pm and on weekends Office Phone: [phone] Office Fax: [phone]

## 2016-12-06 ENCOUNTER — Encounter: Payer: Self-pay | Admitting: Adult Health

## 2016-12-06 ENCOUNTER — Non-Acute Institutional Stay (SKILLED_NURSING_FACILITY): Payer: Medicare Other | Admitting: Adult Health

## 2016-12-06 DIAGNOSIS — K5901 Slow transit constipation: Secondary | ICD-10-CM | POA: Diagnosis not present

## 2016-12-06 DIAGNOSIS — N183 Chronic kidney disease, stage 3 (moderate): Secondary | ICD-10-CM | POA: Diagnosis not present

## 2016-12-06 DIAGNOSIS — I952 Hypotension due to drugs: Secondary | ICD-10-CM

## 2016-12-06 DIAGNOSIS — E875 Hyperkalemia: Secondary | ICD-10-CM

## 2016-12-06 DIAGNOSIS — N179 Acute kidney failure, unspecified: Secondary | ICD-10-CM

## 2016-12-06 DIAGNOSIS — D72829 Elevated white blood cell count, unspecified: Secondary | ICD-10-CM

## 2016-12-06 LAB — CBC AND DIFFERENTIAL
HEMATOCRIT: 30 % — AB (ref 36–46)
Hemoglobin: 9.4 g/dL — AB (ref 12.0–16.0)
NEUTROS ABS: 7 /uL
Platelets: 346 10*3/uL (ref 150–399)
WBC: 10.9 10^3/mL

## 2016-12-06 LAB — BASIC METABOLIC PANEL
BUN: 39 mg/dL — AB (ref 4–21)
Creatinine: 1.9 mg/dL — AB (ref 0.5–1.1)
GLUCOSE: 99 mg/dL
Potassium: 6.2 mmol/L — AB (ref 3.4–5.3)
SODIUM: 138 mmol/L (ref 137–147)

## 2016-12-06 NOTE — Progress Notes (Signed)
DATE:  12/06/2016   MRN:  [id number]  BIRTHDAY: 08-19-1941  Facility:  [facility] Location:  [place] Room Number: 1206-P  LEVEL OF CARE:  SNF [phone])  Contact Information    Name Relation Home Work [place] [phone]     [place] Other [phone]  [phone]   Guido Sander [phone]         Code Status History    Date Active Date Inactive Code Status Order ID Comments User Context   11/19/2016  6:04 PM 11/21/2016  5:02 PM Full Code [id number]  Jovita Gamma, MD Inpatient   03/21/2016  9:03 AM 03/24/2016  5:31 PM Full Code [id number]  Sid Falcon, MD Inpatient       Chief Complaint  Patient presents with  . Acute Visit    Hyperkalemia, leukocytosis and Abdominal pain    HISTORY OF PRESENT ILLNESS:  This is a 76 year old female who is currently having a short-term rehabilitation @ [facility]. She has complained of RLQ pain. No mass noted upon deep palpation of site. It was not tender and patient said that pain comes and go. KUB was done and showed mild constipation. Latest K 6.2, elevated, wbc 10.9. No fever has been reported.   She has been admitted to [facility] on 11/21/16 for short-term rehabilitation following an admission at [facility] 11/19/16-11/21/16 with Multilevel, multifactorial lumbar stenosis and neurogenic claudication, multilevel dynamic lumbar spondylolisthesis, lumbar HNP, lumbar spondylolisthesis, lumbar degenerative disc disease S/P L3-S1 decompressive lumbar laminectomy, L3-4, L4-5, L5-S1 posterior lumbar interbody arthrodesis, and a L3-S1 posterior lateral arthrodesis with posterior instrumentation and bone graft.     PAST MEDICAL HISTORY:  Past Medical History:  Diagnosis Date  . Anxiety   . Complication of anesthesia 2003   gallbladder surgery-resp. arrest-stop operation and pt. went to ICU  . Depression   . Hypertension   . Pancreatitis, chronic ([place])  03/2016  . Sleep apnea      CURRENT MEDICATIONS: Reviewed  Patient's Medications  New Prescriptions   No medications on file  Previous Medications   ACETAMINOPHEN (TYLENOL) 500 MG TABLET    Take 1,000 mg by mouth every 6 (six) hours as needed for moderate pain or headache.   BACLOFEN PO    Take 5 mg by mouth every 12 (twelve) hours as needed (Muscle spasms).   CLORAZEPATE (TRANXENE) 3.75 MG TABLET    Take 3.75 mg by mouth at bedtime.    DILTIAZEM (CARDIZEM CD) 180 MG 24 HR CAPSULE    Take 180 mg by mouth daily. Hold for HR <55 or >105   HYDROCODONE-ACETAMINOPHEN (NORCO/VICODIN) 5-325 MG TABLET    Take 1-2 tablets by mouth every 4 (four) hours as needed (mild pain).   IMIPRAMINE (TOFRANIL) 50 MG TABLET    Take 50 mg by mouth at bedtime.   LISINOPRIL (PRINIVIL,ZESTRIL) 40 MG TABLET    Take 40 mg by mouth daily.   NON FORMULARY    1 each by Other route See admin instructions. CPAP machine nightly   PANTOPRAZOLE (PROTONIX) 40 MG TABLET    Take 1 tablet (40 mg total) by mouth daily.   POLYETHYLENE GLYCOL (MIRALAX / GLYCOLAX) PACKET    Take 17 g by mouth daily.   SENNOSIDES-DOCUSATE SODIUM (SENOKOT-S) 8.6-50 MG TABLET    Take 2 tablets by mouth every evening.   SIMETHICONE (MYLICON) 0000000 MG CHEWABLE TABLET    Chew 125 mg by mouth every 12 (twelve)  hours as needed (Gaseous distention).   SODIUM POLYSTYRENE (KAYEXALATE) 15 GM/60ML SUSPENSION    Take 15 g by mouth. Give now and then repeat in 3 hours   SOLIFENACIN (VESICARE) 5 MG TABLET    Take 5 mg by mouth daily.   SPIRONOLACTONE (ALDACTONE) 50 MG TABLET    Take 50 mg by mouth daily.   Modified Medications   No medications on file  Discontinued Medications   DILTIAZEM (CARDIZEM CD) 240 MG 24 HR CAPSULE    Take 240 mg by mouth daily.      Allergies  Allergen Reactions  . No Known Allergies      REVIEW OF SYSTEMS:  GENERAL: no change in appetite, no fatigue, no weight changes, no fever, chills or weakness SKIN: Denies rash, itching,  wounds, ulcer sores, or nail abnormality EYES: Denies change in vision, dry eyes, eye pain, itching or discharge EARS: Denies change in hearing, ringing in ears, or earache NOSE: Denies nasal congestion or epistaxis MOUTH and THROAT: Denies oral discomfort, gingival pain or bleeding, pain from teeth or hoarseness   RESPIRATORY: no cough, SOB, DOE, wheezing, hemoptysis CARDIAC: no chest pain, edema or palpitations GI: no abdominal pain, diarrhea, constipation, heart burn, nausea or vomiting GU: Denies dysuria, frequency, hematuria, incontinence, or discharge PSYCHIATRIC: Denies feeling of depression or anxiety. No report of hallucinations, insomnia, paranoia, or agitation     PHYSICAL EXAMINATION  GENERAL APPEARANCE: Well nourished. In no acute distress. Obese SKIN:  Midline lower back surgical incision dry, no erythema HEAD: Normal in size and contour. No evidence of trauma EYES: Lids open and close normally. No blepharitis, entropion or ectropion. PERRL. Conjunctivae are clear and sclerae are white. Lenses are without opacity EARS: Pinnae are normal. Patient hears normal voice tunes of the examiner MOUTH and THROAT: Lips are without lesions. Oral mucosa is moist and without lesions. Tongue is normal in shape, size, and color and without lesions NECK: supple, trachea midline, no neck masses, no thyroid tenderness, no thyromegaly LYMPHATICS: no LAN in the neck, no supraclavicular LAN RESPIRATORY: breathing is even & unlabored, BS CTAB CARDIAC: RRR, no murmur,no extra heart sounds, no edema GI: abdomen soft, normal BS, no masses, no tenderness, no hepatomegaly, no splenomegaly EXTREMITIES:  Able to move 4 extremities; has lumbar belt PSYCHIATRIC: Alert and oriented X 3. Affect and behavior are appropriate    LABS/RADIOLOGY: Labs reviewed: Basic Metabolic Panel:  Recent Labs  03/23/16 0345 03/24/16 0351 11/12/16 1334 11/23/16 12/06/16  NA 136 140 138 140 138  K 4.5 4.7 4.6  5.8* 6.2*  CL 111 112* 104  --   --   CO2 16* 22 25  --   --   GLUCOSE 136* 94 111*  --   --   BUN 22* 22* 18 40* 39*  CREATININE 1.40* 1.39* 1.61* 1.4* 1.9*  CALCIUM 8.3* 8.6* 9.3  --   --    Liver Function Tests:  Recent Labs  03/21/16 0025 03/22/16 0403 03/24/16 0351  AST 18 14* 13*  ALT 17 16 16   ALKPHOS 91 71 81  BILITOT 0.3 0.2* <0.1*  PROT 8.2* 5.9* 5.9*  ALBUMIN 4.6 3.2* 3.3*    Recent Labs  03/16/16 2347 03/21/16 0025 03/23/16 0345  LIPASE 28 93* 102*   CBC:  Recent Labs  03/16/16 2347  03/23/16 0345 03/24/16 0351 11/12/16 1334 11/23/16 12/06/16  WBC 17.4*  < > 6.2 7.5 10.8* 13.3 10.9  NEUTROABS 15.5*  --   --   --   --  10 7  HGB 13.3  < > 10.5* 10.1* 13.4 8.4* 9.4*  HCT 39.9  < > 31.3* 29.5* 40.5 26* 30*  MCV 91.9  < > 91.3 91.0 92.9  --   --   PLT 345  < > 275 279 331 303 346  < > = values in this interval not displayed. Cardiac Enzymes:  Recent Labs  03/21/16 0900 03/21/16 1549  TROPONINI <0.03 <0.03     Dg Lumbar Spine 2-3 Views  Result Date: 11/19/2016 CLINICAL DATA:  Posterior lumbar interbody fusion. EXAM: LUMBAR SPINE - 2-3 VIEW COMPARISON:  MRI 10/05/2016 FINDINGS: Two fluoroscopic images demonstrate bilateral pedicle screws at L3, L4, L5 and S1. Interbody devices at L4-L5 and L5-S1. IMPRESSION: Lumbar spine surgery involving L3, L4, L5 and S1. Electronically Signed   By: Markus Daft M.D.   On: 11/19/2016 14:09   Dg Lumbar Spine 2-3 Views  Result Date: 11/19/2016 CLINICAL DATA:  Lumbar spine surgery. EXAM: LUMBAR SPINE - 2-3 VIEW COMPARISON:  MRI 10/05/2016. FINDINGS: Lumbar vertebrae numbered as per our MRI . On image number 1 metallic markers noted posteriorly at L2 and L5. On image number 2 metallic markers noted at the L4-L5 and L5-S1 disc space level. IMPRESSION: Intraoperative lumbar spine as above . Electronically Signed   By: Marcello Moores  Register   On: 11/19/2016 09:57   Dg C-arm 1-60 Min  Result Date: 11/19/2016 CLINICAL DATA:   Posterior lumbar interbody fusion EXAM: DG C-ARM 61-120 MIN COMPARISON:  11/19/2016 FINDINGS: Two low resolution intraoperative spot films of the lumbar spine are submitted. The images were obtained during posterior lumbar interbody fusion from L3 through S1 with interbody devices present at L4-L5 and L5-S1. Total fluoroscopy time was 1 minutes 14 second IMPRESSION: Intraoperative fluoroscopic assistance provided during posterior lumbar interbody fusion. Electronically Signed   By: Donavan Foil M.D.   On: 11/19/2016 17:40    ASSESSMENT/PLAN:  Hyperkalemia -  give Kayexalate 15 g/60 mL by mouth 1 now and then repeat in 3 hours; BNP on 12/07/16 Lab Results  Component Value Date   K 6.2 (A) 12/06/2016   Constipation - start MiraLAX 17 g by mouth daily and continue senna S 2 tabs by mouth daily at bedtime   Hypotension - BP reviewed and was noted to have lows 101/60, 91/51, 115/79; will decrease Aldactone from 50 mg to 25 mg 1 tab by mouth daily  Acute on chronic kidney disease, stage III - GFR 27.27; will monitor Lab Results  Component Value Date   CREATININE 1.9 (A) 12/06/2016   Leukocytosis -  has going down trend Lab Results  Component Value Date   WBC 10.9 12/06/2016         Monina C. [place] - NP [employer] [phone]

## 2016-12-07 LAB — BASIC METABOLIC PANEL
BUN: 34 mg/dL — AB (ref 4–21)
BUN: 34 mg/dL — AB (ref 4–21)
CREATININE: 1.9 mg/dL — AB (ref 0.5–1.1)
Creatinine: 1.9 mg/dL — AB (ref 0.5–1.1)
GLUCOSE: 115 mg/dL
Glucose: 115 mg/dL
Potassium: 6.8 mmol/L — AB (ref 3.4–5.3)
Potassium: 6.8 mmol/L — AB (ref 3.4–5.3)
SODIUM: 137 mmol/L (ref 137–147)
Sodium: 137 mmol/L (ref 137–147)

## 2016-12-10 ENCOUNTER — Encounter: Payer: Self-pay | Admitting: Adult Health

## 2016-12-10 NOTE — Progress Notes (Signed)
DATE:  12/11/2016 MRN:  [id number]  BIRTHDAY: 17-Aug-1941  Facility:  [facility] Home Location:  [place] Room Number: 1206-P  LEVEL OF CARE:  SNF [phone])  Contact Information    Name Relation Home Work [place] [id number]     [place] Other [phone]  [phone]   Guido Sander [phone]         Code Status History    Date Active Date Inactive Code Status Order ID Comments User Context   11/19/2016  6:04 PM 11/21/2016  5:02 PM Full Code [id number]  Jovita Gamma, MD Inpatient   03/21/2016  9:03 AM 03/24/2016  5:31 PM Full Code [id number]  Sid Falcon, MD Inpatient       Chief Complaint  Patient presents with  . Discharge Note    HISTORY OF PRESENT ILLNESS:  This is a 66-YO female seen for a discharge visit.  She was admitted to [facility] on 11/21/16 for short-term rehabilitation following an admission at [facility] 11/19/16-11/21/16 for a L3-S1 decompressive lumbar laminectomy, a L3-4, L4-5, and L5-S1 posterior lumbar interbody arthrodesis, and a L3-L1 posterior lateral arthrodesis with posterior instrumentation and bone graft.    She is discharging home on 12/11/16 with home health PT, OT, CNA, and nursing services.      PAST MEDICAL HISTORY:  Past Medical History:  Diagnosis Date  . Anxiety   . Complication of anesthesia 2003   gallbladder surgery-resp. arrest-stop operation and pt. went to ICU  . Depression   . Hypertension   . Pancreatitis, chronic ([place]) 03/2016  . Sleep apnea      CURRENT MEDICATIONS: Reviewed  Patient's Medications  New Prescriptions   No medications on file  Previous Medications   ACETAMINOPHEN (TYLENOL) 500 MG TABLET    Take 1,000 mg by mouth every 6 (six) hours as needed for moderate pain or headache.   BACLOFEN PO    Take 5 mg by mouth every 12 (twelve) hours as needed (Muscle spasms).   CLORAZEPATE (TRANXENE) 3.75 MG TABLET    Take 3.75 mg by mouth  at bedtime.    DILTIAZEM (CARDIZEM CD) 180 MG 24 HR CAPSULE    Take 180 mg by mouth daily. Hold for HR <55 or >105   HYDROCODONE-ACETAMINOPHEN (NORCO/VICODIN) 5-325 MG TABLET    Take 1-2 tablets by mouth every 4 (four) hours as needed (mild pain).   IMIPRAMINE (TOFRANIL) 50 MG TABLET    Take 50 mg by mouth at bedtime.   LISINOPRIL (PRINIVIL,ZESTRIL) 40 MG TABLET    Take 40 mg by mouth daily.   NON FORMULARY    1 each by Other route See admin instructions. CPAP machine nightly   PANTOPRAZOLE (PROTONIX) 40 MG TABLET    Take 1 tablet (40 mg total) by mouth daily.   POLYETHYLENE GLYCOL (MIRALAX / GLYCOLAX) PACKET    Take 17 g by mouth daily.   SENNOSIDES-DOCUSATE SODIUM (SENOKOT-S) 8.6-50 MG TABLET    Take 2 tablets by mouth every evening.   SIMETHICONE (MYLICON) 0000000 MG CHEWABLE TABLET    Chew 125 mg by mouth every 12 (twelve) hours as needed (Gaseous distention).   SOLIFENACIN (VESICARE) 5 MG TABLET    Take 5 mg by mouth daily.   SPIRONOLACTONE (ALDACTONE) 25 MG TABLET    Take 25 mg by mouth daily.  Modified Medications   No medications on file  Discontinued Medications   SODIUM POLYSTYRENE (KAYEXALATE) 15 GM/60ML SUSPENSION  Take 15 g by mouth. Give now and then repeat in 3 hours   SPIRONOLACTONE (ALDACTONE) 50 MG TABLET    Take 50 mg by mouth daily.      Allergies  Allergen Reactions  . No Known Allergies      REVIEW OF SYSTEMS:  GENERAL: no change in appetite, no fatigue, no weight changes, no fever, chills or weakness SKIN: Denies rash, itching, wounds, ulcer sores, or nail abnormality EYES: Denies change in vision, dry eyes, eye pain, itching or discharge EARS: Denies change in hearing, ringing in ears, or earache NOSE: Denies nasal congestion or epistaxis MOUTH and THROAT: Denies oral discomfort, gingival pain or bleeding, pain from teeth or hoarseness   RESPIRATORY: no cough, SOB, DOE, wheezing, hemoptysis CARDIAC: no chest pain, edema or palpitations GI: no abdominal pain,  diarrhea, constipation, heart burn, nausea or vomiting GU: Denies dysuria, frequency, hematuria, incontinence, or discharge MUSCULOSKELETAL: Denies joit pain, muscle pain, back pain, restricted movement, or unusual weakness CIRCULATION: Denies claudication, edema of legs, varicosities, or cold extremities NEUROLOGICAL: Denies dizziness, syncope, numbness, or headache PSYCHIATRIC: Denies feeling of depression or anxiety. No report of hallucinations, insomnia, paranoia, or agitation ENDOCRINE: Denies polyphagia, polyuria, polydipsia, heat or cold intolerance HEME/LYMPH: Denies excessive bruising, petechia, enlarged lymph nodes, or bleeding problems IMMUNOLOGIC: Denies history of frequent infections, AIDS, or use of immunosuppressive agents    PHYSICAL EXAMINATION  GENERAL APPEARANCE: Well nourished. In no acute distress. Normal body habitus SKIN:  Skin is warm and dry. There are no suspicious lesions or rash HEAD: Normal in size and contour. No evidence of trauma EYES: Lids open and close normally. No blepharitis, entropion or ectropion. PERRL. Conjunctivae are clear and sclerae are white. Lenses are without opacity EARS: Pinnae are normal. Patient hears normal voice tunes of the examiner MOUTH and THROAT: Lips are without lesions. Oral mucosa is moist and without lesions. Tongue is normal in shape, size, and color and without lesions NECK: supple, trachea midline, no neck masses, no thyroid tenderness, no thyromegaly LYMPHATICS: no LAN in the neck, no supraclavicular LAN RESPIRATORY: breathing is even & unlabored, BS CTAB CARDIAC: RRR, no murmur,no extra heart sounds, no edema GI: abdomen soft, normal BS, no masses, no tenderness, no hepatomegaly, no splenomegaly MUSCULOSKELETAL: No deformities. Movement at each extremity is full and painless. Strength is 5/5 at each extremity. Back is without kyphosis or scoliosis CIRCULATION: pedal pulses are 2+. There is no edema of the legs, ankles and  feet NEUROLOGICAL: There is no tremor. Speech is clear PSYCHIATRIC: Alert and oriented X 3. Affect and behavior are appropriate  LABS/RADIOLOGY: Labs reviewed: Basic Metabolic Panel:  Recent Labs  03/23/16 0345 03/24/16 0351 11/12/16 1334 11/23/16 12/06/16 12/07/16  NA 136 140 138 140 138 137  K 4.5 4.7 4.6 5.8* 6.2* 6.8*  CL 111 112* 104  --   --   --   CO2 16* 22 25  --   --   --   GLUCOSE 136* 94 111*  --   --   --   BUN 22* 22* 18 40* 39* 34*  CREATININE 1.40* 1.39* 1.61* 1.4* 1.9* 1.9*  CALCIUM 8.3* 8.6* 9.3  --   --   --    Liver Function Tests:  Recent Labs  03/21/16 0025 03/22/16 0403 03/24/16 0351  AST 18 14* 13*  ALT 17 16 16   ALKPHOS 91 71 81  BILITOT 0.3 0.2* <0.1*  PROT 8.2* 5.9* 5.9*  ALBUMIN 4.6 3.2* 3.3*    Recent  Labs  03/16/16 2347 03/21/16 0025 03/23/16 0345  LIPASE 28 93* 102*   CBC:  Recent Labs  03/16/16 2347  03/23/16 0345 03/24/16 0351 11/12/16 1334 11/23/16 12/06/16  WBC 17.4*  < > 6.2 7.5 10.8* 13.3 10.9  NEUTROABS 15.5*  --   --   --   --  10 7  HGB 13.3  < > 10.5* 10.1* 13.4 8.4* 9.4*  HCT 39.9  < > 31.3* 29.5* 40.5 26* 30*  MCV 91.9  < > 91.3 91.0 92.9  --   --   PLT 345  < > 275 279 331 303 346  < > = values in this interval not displayed. Cardiac Enzymes:  Recent Labs  03/21/16 0900 03/21/16 1549  TROPONINI <0.03 <0.03    Dg Lumbar Spine 2-3 Views  Result Date: 11/19/2016 CLINICAL DATA:  Posterior lumbar interbody fusion. EXAM: LUMBAR SPINE - 2-3 VIEW COMPARISON:  MRI 10/05/2016 FINDINGS: Two fluoroscopic images demonstrate bilateral pedicle screws at L3, L4, L5 and S1. Interbody devices at L4-L5 and L5-S1. IMPRESSION: Lumbar spine surgery involving L3, L4, L5 and S1. Electronically Signed   By: Markus Daft M.D.   On: 11/19/2016 14:09   Dg Lumbar Spine 2-3 Views  Result Date: 11/19/2016 CLINICAL DATA:  Lumbar spine surgery. EXAM: LUMBAR SPINE - 2-3 VIEW COMPARISON:  MRI 10/05/2016. FINDINGS: Lumbar vertebrae  numbered as per our MRI . On image number 1 metallic markers noted posteriorly at L2 and L5. On image number 2 metallic markers noted at the L4-L5 and L5-S1 disc space level. IMPRESSION: Intraoperative lumbar spine as above . Electronically Signed   By: Marcello Moores  Register   On: 11/19/2016 09:57   Dg C-arm 1-60 Min  Result Date: 11/19/2016 CLINICAL DATA:  Posterior lumbar interbody fusion EXAM: DG C-ARM 61-120 MIN COMPARISON:  11/19/2016 FINDINGS: Two low resolution intraoperative spot films of the lumbar spine are submitted. The images were obtained during posterior lumbar interbody fusion from L3 through S1 with interbody devices present at L4-L5 and L5-S1. Total fluoroscopy time was 1 minutes 14 second IMPRESSION: Intraoperative fluoroscopic assistance provided during posterior lumbar interbody fusion. Electronically Signed   By: Donavan Foil M.D.   On: 11/19/2016 17:40    ASSESSMENT/PLAN:          Elmore Guise, [place] [phone]   This encounter was created in error - please disregard.

## 2016-12-11 ENCOUNTER — Encounter: Payer: Self-pay | Admitting: Adult Health

## 2016-12-11 ENCOUNTER — Non-Acute Institutional Stay (SKILLED_NURSING_FACILITY): Payer: Medicare Other | Admitting: Adult Health

## 2016-12-11 DIAGNOSIS — K219 Gastro-esophageal reflux disease without esophagitis: Secondary | ICD-10-CM | POA: Diagnosis not present

## 2016-12-11 DIAGNOSIS — M48062 Spinal stenosis, lumbar region with neurogenic claudication: Secondary | ICD-10-CM | POA: Diagnosis not present

## 2016-12-11 DIAGNOSIS — F419 Anxiety disorder, unspecified: Secondary | ICD-10-CM | POA: Diagnosis not present

## 2016-12-11 DIAGNOSIS — N3281 Overactive bladder: Secondary | ICD-10-CM

## 2016-12-11 DIAGNOSIS — G4733 Obstructive sleep apnea (adult) (pediatric): Secondary | ICD-10-CM | POA: Diagnosis not present

## 2016-12-11 DIAGNOSIS — N183 Chronic kidney disease, stage 3 unspecified: Secondary | ICD-10-CM

## 2016-12-11 DIAGNOSIS — F329 Major depressive disorder, single episode, unspecified: Secondary | ICD-10-CM

## 2016-12-11 DIAGNOSIS — F32A Depression, unspecified: Secondary | ICD-10-CM

## 2016-12-11 DIAGNOSIS — I10 Essential (primary) hypertension: Secondary | ICD-10-CM

## 2016-12-11 DIAGNOSIS — K5901 Slow transit constipation: Secondary | ICD-10-CM

## 2016-12-11 DIAGNOSIS — R2681 Unsteadiness on feet: Secondary | ICD-10-CM | POA: Diagnosis not present

## 2016-12-11 LAB — BASIC METABOLIC PANEL
BUN: 40 mg/dL — AB (ref 4–21)
Creatinine: 2.2 mg/dL — AB (ref 0.5–1.1)
Glucose: 130 mg/dL
POTASSIUM: 5 mmol/L (ref 3.4–5.3)
SODIUM: 134 mmol/L — AB (ref 137–147)

## 2016-12-11 NOTE — Progress Notes (Signed)
DATE:  12/11/2016   MRN:  [id number]  BIRTHDAY: 1941/08/07  Facility:  Nursing Home Location:  [place] Room Number: 1206-P  LEVEL OF CARE:  SNF [id number])  Contact Information    Name Relation Home Work [place] [id number]     [place] Other [phone]  [phone]   Guido Sander [phone]         Code Status History    Date Active Date Inactive Code Status Order ID Comments User Context   11/19/2016  6:04 PM 11/21/2016  5:02 PM Full Code [id number]  Jovita Gamma, MD Inpatient   03/21/2016  9:03 AM 03/24/2016  5:31 PM Full Code [id number]  Sid Falcon, MD Inpatient       Chief Complaint  Patient presents with  . Discharge Note    HISTORY OF PRESENT ILLNESS:  This is a 60-YO female seen for a discharge visit.  She was admitted to [facility] on 11/21/16 for short-term rehabilitation following an admission at [facility] on 11/19/16-11/21/16 for an L3-S1 decompressive lumbar laminectomy, an L3-4, L4-5, L5-S1 posterior lumbar interbody arthrodesis, and an L3-S1 posterior lateral arthrodesis with posterior instrumentation and bone graft.  She is discharging to home on 12/11/16 with home health PT, OT, CNA, and Nursing services.    Patient was admitted to this facility for short-term rehabilitation after the patient's recent hospitalization.  Patient has completed SNF rehabilitation and therapy has cleared the patient for discharge.   PAST MEDICAL HISTORY:  Past Medical History:  Diagnosis Date  . Anxiety   . Complication of anesthesia 2003   gallbladder surgery-resp. arrest-stop operation and pt. went to ICU  . Depression   . Hypertension   . Pancreatitis, chronic ([place]) 03/2016  . Sleep apnea      CURRENT MEDICATIONS: Reviewed  Patient's Medications  New Prescriptions   No medications on file  Previous Medications   ACETAMINOPHEN (TYLENOL) 500 MG TABLET    Take 1,000 mg by mouth  every 6 (six) hours as needed for moderate pain or headache.   BACLOFEN PO    Take 5 mg by mouth every 12 (twelve) hours as needed (Muscle spasms).   CLORAZEPATE (TRANXENE) 3.75 MG TABLET    Take 3.75 mg by mouth at bedtime.    DILTIAZEM (CARDIZEM CD) 180 MG 24 HR CAPSULE    Take 180 mg by mouth daily. Hold for HR <55 or >105   HYDROCODONE-ACETAMINOPHEN (NORCO/VICODIN) 5-325 MG TABLET    Take 1-2 tablets by mouth every 4 (four) hours as needed (mild pain).   IMIPRAMINE (TOFRANIL) 50 MG TABLET    Take 50 mg by mouth at bedtime.   LISINOPRIL (PRINIVIL,ZESTRIL) 40 MG TABLET    Take 40 mg by mouth daily.   NON FORMULARY    1 each by Other route See admin instructions. CPAP machine nightly   PANTOPRAZOLE (PROTONIX) 40 MG TABLET    Take 1 tablet (40 mg total) by mouth daily.   POLYETHYLENE GLYCOL (MIRALAX / GLYCOLAX) PACKET    Take 17 g by mouth daily.   SENNOSIDES-DOCUSATE SODIUM (SENOKOT-S) 8.6-50 MG TABLET    Take 2 tablets by mouth every evening.   SIMETHICONE (MYLICON) 0000000 MG CHEWABLE TABLET    Chew 125 mg by mouth every 12 (twelve) hours as needed (Gaseous distention).   SOLIFENACIN (VESICARE) 5 MG TABLET    Take 5 mg by mouth daily.   SPIRONOLACTONE (ALDACTONE) 25 MG TABLET  Take 25 mg by mouth daily.  Modified Medications   No medications on file  Discontinued Medications   No medications on file     Allergies  Allergen Reactions  . No Known Allergies      REVIEW OF SYSTEMS:  GENERAL: no change in appetite, no fatigue, no weight changes, no fever, chills or weakness EYES: Denies change in vision, dry eyes, eye pain, itching or discharge EARS: Denies change in hearing, ringing in ears, or earache NOSE: Denies nasal congestion or epistaxis MOUTH and THROAT: Denies oral discomfort, gingival pain or bleeding, pain from teeth or hoarseness   RESPIRATORY: no cough, SOB, DOE, wheezing, hemoptysis CARDIAC: no chest pain, edema or palpitations GI: no abdominal pain, diarrhea,  constipation, heart burn, nausea or vomiting GU: Denies dysuria, frequency, hematuria, incontinence, or discharge PSYCHIATRIC: Denies feeling of depression or anxiety. No report of hallucinations, insomnia, paranoia, or agitation   PHYSICAL EXAMINATION  GENERAL APPEARANCE: Well nourished. In no acute distress. Obese SKIN:  Lower back spinal surgical incision is dry, no erythema HEAD: Normal in size and contour. No evidence of trauma EYES: Lids open and close normally. No blepharitis, entropion or ectropion. PERRL. Conjunctivae are clear and sclerae are white. Lenses are without opacity EARS: Pinnae are normal. Patient hears normal voice tunes of the examiner MOUTH and THROAT: Lips are without lesions. Oral mucosa is moist and without lesions. Tongue is normal in shape, size, and color and without lesions NECK: supple, trachea midline, no neck masses, no thyroid tenderness, no thyromegaly LYMPHATICS: no LAN in the neck, no supraclavicular LAN RESPIRATORY: breathing is even & unlabored, BS CTAB CARDIAC: RRR, no murmur,no extra heart sounds, no edema GI: abdomen soft, normal BS, no masses, no tenderness, no hepatomegaly, no splenomegaly EXTREMITIES:  Able to move X 4 extremities, has lumbar brace, walks with walker PSYCHIATRIC: Alert and oriented X 3. Affect and behavior are appropriate   LABS/RADIOLOGY: Labs reviewed: Basic Metabolic Panel:  Recent Labs  03/23/16 0345 03/24/16 0351 11/12/16 1334  12/06/16 12/07/16 12/11/16  NA 136 140 138  < > 138 137  137 134*  K 4.5 4.7 4.6  < > 6.2* 6.8*  6.8* 5.0  CL 111 112* 104  --   --   --   --   CO2 16* 22 25  --   --   --   --   GLUCOSE 136* 94 111*  --   --   --   --   BUN 22* 22* 18  < > 39* 34*  34* 40*  CREATININE 1.40* 1.39* 1.61*  < > 1.9* 1.9*  1.9* 2.2*  CALCIUM 8.3* 8.6* 9.3  --   --   --   --   < > = values in this interval not displayed. Liver Function Tests:  Recent Labs  03/21/16 0025 03/22/16 0403 03/24/16 0351    AST 18 14* 13*  ALT 17 16 16   ALKPHOS 91 71 81  BILITOT 0.3 0.2* <0.1*  PROT 8.2* 5.9* 5.9*  ALBUMIN 4.6 3.2* 3.3*    Recent Labs  03/16/16 2347 03/21/16 0025 03/23/16 0345  LIPASE 28 93* 102*   CBC:  Recent Labs  03/16/16 2347  03/23/16 0345 03/24/16 0351 11/12/16 1334 11/23/16 12/06/16  WBC 17.4*  < > 6.2 7.5 10.8* 13.3 10.9  NEUTROABS 15.5*  --   --   --   --  10 7  HGB 13.3  < > 10.5* 10.1* 13.4 8.4* 9.4*  HCT 39.9  < >  31.3* 29.5* 40.5 26* 30*  MCV 91.9  < > 91.3 91.0 92.9  --   --   PLT 345  < > 275 279 331 303 346  < > = values in this interval not displayed.  Cardiac Enzymes:  Recent Labs  03/21/16 0900 03/21/16 1549  TROPONINI <0.03 <0.03     Dg Lumbar Spine 2-3 Views  Result Date: 11/19/2016 CLINICAL DATA:  Posterior lumbar interbody fusion. EXAM: LUMBAR SPINE - 2-3 VIEW COMPARISON:  MRI 10/05/2016 FINDINGS: Two fluoroscopic images demonstrate bilateral pedicle screws at L3, L4, L5 and S1. Interbody devices at L4-L5 and L5-S1. IMPRESSION: Lumbar spine surgery involving L3, L4, L5 and S1. Electronically Signed   By: Markus Daft M.D.   On: 11/19/2016 14:09   Dg Lumbar Spine 2-3 Views  Result Date: 11/19/2016 CLINICAL DATA:  Lumbar spine surgery. EXAM: LUMBAR SPINE - 2-3 VIEW COMPARISON:  MRI 10/05/2016. FINDINGS: Lumbar vertebrae numbered as per our MRI . On image number 1 metallic markers noted posteriorly at L2 and L5. On image number 2 metallic markers noted at the L4-L5 and L5-S1 disc space level. IMPRESSION: Intraoperative lumbar spine as above . Electronically Signed   By: Marcello Moores  Register   On: 11/19/2016 09:57   Dg C-arm 1-60 Min  Result Date: 11/19/2016 CLINICAL DATA:  Posterior lumbar interbody fusion EXAM: DG C-ARM 61-120 MIN COMPARISON:  11/19/2016 FINDINGS: Two low resolution intraoperative spot films of the lumbar spine are submitted. The images were obtained during posterior lumbar interbody fusion from L3 through S1 with interbody devices  present at L4-L5 and L5-S1. Total fluoroscopy time was 1 minutes 14 second IMPRESSION: Intraoperative fluoroscopic assistance provided during posterior lumbar interbody fusion. Electronically Signed   By: Donavan Foil M.D.   On: 11/19/2016 17:40    ASSESSMENT/PLAN:  1. Unsteady gait - for home health PT and OT, for therapeutic strengthening exercises; fall precautions   2. Lumbar stenosis with neurogenic claudication - S/P L3-S1 decompressive lumbar laminectomy, L3-4, L4-5 and L5-S1 posterior lumbar interbody arthrodesis and L3-S1 posterior lateral arthrodesis with posterior instrumentation and bone graft, follow-up with neurosurgeon, continue baclofen 5 mg 1 tab by mouth every 12 hours when necessary for muscle spasm; Norco 5/325 mg 1-2 tabs by mouth every 4 hours when necessary acetaminophen 500 mg 2 tabs = 1000 mg by mouth every 6 hours when necessary for pain   3. Depression, unspecified depression type - continue imipramine 50 mg 1 tab by mouth daily at bedtime   4. Essential hypertension - well controlled; continue lisinopril 40 mg 1 tab by mouth daily, Aldactone 25 mg 1 tab by mouth daily and Cardizem CD 180 mg 1 capsule by mouth daily   5. OAB (overactive bladder) - continue Vesicare 5 mg 1 tab by mouth daily   6. Slow transit constipation - continue MiraLAX 17 g by mouth daily and senna S 8.6-50 mg 2 tabs by mouth daily at bedtime   7. Anxiety - mood this is stable; continue clorazepate 3.75 mg 1 tab by mouth daily at bedtime   8. Gastroesophageal reflux disease without esophagitis - continue Protonix 40 mg 1 tab by mouth daily   9. OSA (obstructive sleep apnea) - continue CPAP at at bedtime   10. Chronic kidney disease, stage III (moderate) - creatinine 2.2, follow-up with PCP     I have filled out patient's discharge paperwork and written prescriptions.  Patient will receive home health PT, OT, Nursing and CNA.  DME provided:  None  Total discharge time: Greater  than 30 minutes Greater than 50% was spent in counseling and coordination of care.   Discharge time involved coordination of the discharge process with social worker, nursing staff and therapy department. Medical justification for home health services verified.   Monina C. Los Cerrillos - NP   [employer] [phone]

## 2017-04-18 ENCOUNTER — Institutional Professional Consult (permissible substitution): Payer: Self-pay | Admitting: Pulmonary Disease

## 2017-06-18 ENCOUNTER — Other Ambulatory Visit: Payer: Self-pay | Admitting: Internal Medicine

## 2017-06-18 DIAGNOSIS — Z1231 Encounter for screening mammogram for malignant neoplasm of breast: Secondary | ICD-10-CM

## 2017-06-26 ENCOUNTER — Ambulatory Visit
Admission: RE | Admit: 2017-06-26 | Discharge: 2017-06-26 | Disposition: A | Discharge status: Home / Self Care | Payer: Medicare Other | Source: Ambulatory Visit | Attending: Internal Medicine | Admitting: Internal Medicine

## 2017-06-26 DIAGNOSIS — Z1231 Encounter for screening mammogram for malignant neoplasm of breast: Secondary | ICD-10-CM

## 2017-06-28 ENCOUNTER — Other Ambulatory Visit: Payer: Self-pay | Admitting: Internal Medicine

## 2017-06-28 ENCOUNTER — Ambulatory Visit
Admission: RE | Admit: 2017-06-28 | Discharge: 2017-06-28 | Disposition: A | Discharge status: Home / Self Care | Payer: Medicare Other | Source: Ambulatory Visit | Attending: Internal Medicine | Admitting: Internal Medicine

## 2017-06-28 DIAGNOSIS — W19XXXA Unspecified fall, initial encounter: Secondary | ICD-10-CM

## 2017-07-04 ENCOUNTER — Institutional Professional Consult (permissible substitution): Payer: Medicare Other | Admitting: Internal Medicine

## 2017-07-25 ENCOUNTER — Institutional Professional Consult (permissible substitution): Payer: Medicare Other | Admitting: Internal Medicine

## 2017-10-03 ENCOUNTER — Ambulatory Visit: Payer: Medicare Other | Attending: Internal Medicine | Admitting: Rehabilitative and Restorative Service Providers"

## 2017-10-03 DIAGNOSIS — R2681 Unsteadiness on feet: Secondary | ICD-10-CM | POA: Insufficient documentation

## 2017-10-03 DIAGNOSIS — R2689 Other abnormalities of gait and mobility: Secondary | ICD-10-CM | POA: Diagnosis present

## 2017-10-03 DIAGNOSIS — H8111 Benign paroxysmal vertigo, right ear: Secondary | ICD-10-CM | POA: Insufficient documentation

## 2017-10-04 NOTE — Therapy (Signed)
[facility] [address] [place], [place], [address] Phone: [phone]   Fax:  [phone]  Physical Therapy Evaluation  Patient Details  Name: Judy [name] MRN: [id number] Date of Birth: [birth year] Referring Provider: Levin Erp, MD  Encounter Date: 10/03/2017      PT End of Session - 10/03/17 1002    Visit Number 1   Number of Visits 12   Date for PT Re-Evaluation 12/02/17   Authorization Type [employer] medicare $30 copay   PT Start Time 0935   PT Stop Time 1020   PT Time Calculation (min) 45 min   Activity Tolerance Patient tolerated treatment well   Behavior During Therapy [facility] for tasks assessed/performed      Past Medical History:  Diagnosis Date  . Anxiety   . Complication of anesthesia 2003   gallbladder surgery-resp. arrest-stop operation and pt. went to ICU  . Depression   . Hypertension   . Pancreatitis, chronic ([place]) 03/2016  . Sleep apnea     Past Surgical History:  Procedure Laterality Date  . CHOLECYSTECTOMY    . COLONOSCOPY  2013    There were no vitals filed for this visit.       Subjective Assessment - 10/03/17 0938    Subjective The patient presents today for referral for balance therapy.  She notes worsening mobility in the past year and frequent falls.  Some falls she comes to rest on the floor and other times she falls onto couch or chair.  "I cannot walk far" noting weakness and fatigue.  She has gained 30-40 pounds in the last year.   She notes a severe fall June 29, 2017 in which she was stepping on a curb and fell posteriorly-- she did not hit her head or lose consciousness, but did hurt her right shoulder (she notes she hurt 2 of the 4 muscles in her right shoulder).     Pertinent History Recently diagnosed with diabetes, back surgery (November 19, 2016 with SNF stay @ [facility] afterwards), HTN, anxiety.   Patient Stated Goals "I want to be able to walk straight".  She notes  she can't get out of the bed well and is unsteady in the morning.    Currently in Pain? Yes   Pain Score 4   "burning" when she moves it   Pain Location Shoulder   Pain Orientation Right   Pain Descriptors / Indicators Aching   Pain Type Chronic pain   Pain Onset More than a month ago   Pain Frequency Intermittent   Aggravating Factors  *from a traumatic fall in 06/2017   Effect of Pain on Daily Activities *PT to monitor but no goal to follow due to nature of referral            [facility] PT Assessment - 10/03/17 0946      Assessment   Medical Diagnosis balance therapy   Referring Provider Levin Erp, MD   Onset Date/Surgical Date 06/29/17   Hand Dominance Right   Prior Therapy @ [facility]     Precautions   Precautions Fall   Precaution Comments also monitor shoulder pain-- reports a rotator cuff injury     Restrictions   Weight Bearing Restrictions No     Balance Screen   Has the patient fallen in the past 6 months Yes   How many times? 3   Has the patient had a decrease in activity level because of a fear of falling?  Yes   Is the patient reluctant to leave their home because of a fear of falling?  No     Home Environment   Living Environment Private residence   Living Arrangements Non-relatives/Friends   Type of [place] entrance   Home Layout Multi-level  3 steps into den   Alternate Level Stairs-Number of Steps 3   Alternate Level Stairs-Rails Can reach both   Home Equipment None     Prior Function   Level of Independence Independent   Vocation Full time employment  32 hours @ [employer]   Vocation Requirements lifting patients     Cognition   Overall Cognitive Status Within Functional Limits for tasks assessed     Sensation   Light Touch Appears Intact     Posture/Postural Control   Posture/Postural Control Postural limitations   Postural Limitations Rounded Shoulders;Forward head     ROM / Strength   AROM / PROM /  Strength AROM;Strength     AROM   Overall AROM Comments WFLs L UE; R UE AROM is limited from injury with fall 06/2017-- 130 degrees flexion.     Strength   Overall Strength Comments L UE has 4/5 shoulder flexion/abduction with some pain noted in biccipital groove with pressure, L elbow flexion/extension 5/5;  limited ROM in R shoulder- no resistance applied. Bilateral hip flexion 5/5, R knee extension 4/5, flexion 5/5.  L knee flexion/extension 5/5, Bilateral ankle dorsiflexion 4/5.      Ambulation/Gait   Ambulation/Gait Yes   Ambulation/Gait Assistance 7: Independent   Ambulation Distance (Feet) 100 Feet   Assistive device None   Gait Pattern --  slowed pace, guarded arm position   Ambulation Surface Level   Gait velocity TBA     Standardized Balance Assessment   Standardized Balance Assessment Berg Balance Test     Berg Balance Test   Sit to Stand Able to stand without using hands and stabilize independently   Standing Unsupported Able to stand 2 minutes with supervision   Sitting with Back Unsupported but Feet Supported on Floor or Stool Able to sit safely and securely 2 minutes   Stand to Sit Sits safely with minimal use of hands   Transfers Able to transfer safely, definite need of hands   Standing Unsupported with Eyes Closed Able to stand 3 seconds   Standing Ubsupported with Feet Together Able to place feet together independently and stand for 1 minute with supervision   From Standing, Reach Forward with Outstretched Arm Can reach forward >12 cm safely (5")   From Standing Position, Pick up Object from Floor Unable to pick up shoe, but reaches 2-5 cm (1-2") from shoe and balances independently   From Standing Position, Turn to Look Behind Over each Shoulder Turn sideways only but maintains balance   Turn 360 Degrees Able to turn 360 degrees safely but slowly   Standing Unsupported, Alternately Place Feet on Step/Stool Able to complete >2 steps/needs minimal assist   Standing  Unsupported, One Foot in Front Able to take small step independently and hold 30 seconds   Standing on One Leg Tries to lift leg/unable to hold 3 seconds but remains standing independently   Total Score 36   Berg comment: 36/56 with posterior pulling noted during standing.  Score indicates high fall risk            Vestibular Assessment - 10/03/17 1004      Vestibular Assessment   General Observation Patient  notes at times "I must lay down too hard" noting dizziness that lasts for seconds.   She reports having to sit on edge of bed in morning due to posterior pulling sensation and she holds walls to walk upon rising.     Positional Testing   Dix-Hallpike Dix-Hallpike Right;Dix-Hallpike Left   Sidelying Test Sidelying Right;Sidelying Left   Horizontal Canal Testing Horizontal Canal Right;Horizontal Canal Left     Dix-Hallpike Right   Dix-Hallpike Right Duration 15 seconds   Dix-Hallpike Right Symptoms Upbeat, right rotatory nystagmus     Dix-Hallpike Left   Dix-Hallpike Left Duration none   Dix-Hallpike Left Symptoms No nystagmus     Sidelying Right   Sidelying Right Duration 15 seconds  patient closed her eyes so nystagmus not viewed     Sidelying Left   Sidelying Left Duration mild sensation   Sidelying Left Symptoms No nystagmus     Horizontal Canal Right   Horizontal Canal Right Duration to be assessed *        Objective measurements completed on examination: See above findings.           Vestibular Treatment/Exercise - 10/03/17 1017      Vestibular Treatment/Exercise   Vestibular Treatment Provided Canalith Repositioning   Canalith Repositioning Epley Manuever Right      EPLEY MANUEVER RIGHT   Number of Reps  1   Response Details  Patient tolerated R epley's well with assist for rolling into positions.                 PT Education - 10/04/17 [phone]    Education provided Yes   Education Details nature of BPPV, goals of therapy   Person(s)  Educated Patient   Methods Explanation   Comprehension Verbalized understanding          PT Short Term Goals - 10/04/17 1002      PT SHORT TERM GOAL #1   Title The patient will be independent with HEP for balance, habituation as needed, and general mobility.   Time 4   Period Weeks   Target Date 11/02/17     PT SHORT TERM GOAL #2   Title The patient will improve Berg balance score from 36/56 to > or equal to 42/56 to demo dec'ing fall risk.   Time 4   Period Weeks   Target Date 11/02/17     PT SHORT TERM GOAL #3   Title The patient will have negative positional testing for R BPPV.   Time 4   Period Weeks   Target Date 11/02/17     PT SHORT TERM GOAL #4   Title The patient will be further assessed for Head impulse testing, gait speed.   Time 4   Period Weeks   Target Date 11/02/17     PT SHORT TERM GOAL #5   Title Provide patient education materials for [employer] clinic (Caren [place], MD) as she requests information about weight loss.   Time 4   Period Weeks   Target Date 11/02/17           PT Long Term Goals - 10/04/17 1006      PT LONG TERM GOAL #1   Title The patient will be indep with post d/c exercise and/or community wellness program.   Time 8   Period Weeks   Target Date 12/02/17     PT LONG TERM GOAL #2   Title The patient will improve Berg score from 36/56 to >  or equal to 45/56 to demo decreased risk for falls.   Time 8   Period Weeks   Target Date 12/02/17     PT LONG TERM GOAL #3   Title The patient will have further goals for gaze, gait speed to follow.   Time 8   Period Weeks   Target Date 12/02/17                Plan - 10/04/17 1007    Clinical Impression Statement The patient is a 76 year old female presenting to outpatient physical therapy with recent history of falls , declining mobility, instability worse in the morning.  She was found to be at high fall risk per Merrilee Jansky balance test, had positive  positional testing for BPPV.  PT to further assess gait and mobility after BPPV cleared.     History and Personal Factors relevant to plan of care: Imbalance, BPPV, gait instability, significant weight gain, diabetes, h/o back surgery   Clinical Presentation Evolving   Clinical Presentation due to: worsening balance, increasing falls   Clinical Decision Making Moderate   Rehab Potential Good   PT Frequency --  2x/week for 4 weeks, 1x/week for 4 weeks   PT Treatment/Interventions ADLs/Self Care Home Management;Therapeutic activities;Therapeutic exercise;Balance training;Neuromuscular re-education;Canalith Repostioning;Vestibular;Gait training;Functional mobility training;Patient/family education   PT Next Visit Plan Check R BPPV, Finish assessing (did not get to as realized had BPPV 1/2 way through eval) gait speed and pattern, check horizontal canals and Head impulse testing, establish HEP.   Consulted and Agree with Plan of Care Patient      Patient will benefit from skilled therapeutic intervention in order to improve the following deficits and impairments:  Abnormal gait, Decreased activity tolerance, Decreased balance, Decreased mobility, Decreased strength, Dizziness, Impaired flexibility  Visit Diagnosis: BPPV (benign paroxysmal positional vertigo), right  Other abnormalities of gait and mobility  Unsteadiness on feet      G-Codes - 10-10-17 03/07/1208    Functional Assessment Tool Used (Outpatient Only) Berg=36/56   Functional Limitation Mobility: Walking and moving around   Mobility: Walking and Moving Around Current Status [phone]) At least 20 percent but less than 40 percent impaired, limited or restricted   Mobility: Walking and Moving Around Goal Status [phone]) At least 1 percent but less than 20 percent impaired, limited or restricted       Problem List Patient Active Problem List   Diagnosis Date Noted  . Lumbar stenosis with neurogenic claudication 11/19/2016  .  Pancreatitis 03/21/2016  . HTN (hypertension) 03/21/2016  . Anxiety   . Depression   . AKI (acute kidney injury) ([place])     [place], PT 10/04/2017, 12:09 PM  [facility] [address] [place], [place], [address] Phone: [phone]   Fax:  [phone]  Name: Judy [name] MRN: [id number] Date of Birth: 1941-06-02

## 2017-10-07 ENCOUNTER — Ambulatory Visit: Payer: Medicare Other

## 2017-10-07 DIAGNOSIS — H8111 Benign paroxysmal vertigo, right ear: Secondary | ICD-10-CM | POA: Diagnosis not present

## 2017-10-07 DIAGNOSIS — R2681 Unsteadiness on feet: Secondary | ICD-10-CM

## 2017-10-07 DIAGNOSIS — R2689 Other abnormalities of gait and mobility: Secondary | ICD-10-CM

## 2017-10-07 NOTE — Therapy (Signed)
[facility] [address] [place], [place], [address] Phone: [phone]   Fax:  [phone]  Physical Therapy Treatment  Patient Details  Name: Judy [name] MRN: [id number] Date of Birth: 1941-07-27 Referring Provider: Levin Erp, MD   Encounter Date: 10/07/2017  PT End of Session - 10/07/17 1147    Visit Number  2    Number of Visits  12    Date for PT Re-Evaluation  12/02/17    Authorization Type  [employer] medicare $30 copay    PT Start Time  1102    PT Stop Time  1142    PT Time Calculation (min)  40 min    Equipment Utilized During Treatment  Gait belt    Activity Tolerance  Patient tolerated treatment well    Behavior During Therapy  [facility] for tasks assessed/performed       Past Medical History:  Diagnosis Date  . Anxiety   . Complication of anesthesia 2003   gallbladder surgery-resp. arrest-stop operation and pt. went to ICU  . Depression   . Hypertension   . Pancreatitis, chronic ([place]) 03/2016  . Sleep apnea     Past Surgical History:  Procedure Laterality Date  . CHOLECYSTECTOMY    . COLONOSCOPY  2013    There were no vitals filed for this visit.  Subjective Assessment - 10/07/17 1106    Subjective  Pt reported she has felt about the same since last session. Pt denied falls. Pt is still dizzy when lying down and getting up.     Pertinent History  Recently diagnosed with diabetes, back surgery (November 19, 2016 with SNF stay @ [facility] afterwards), HTN, anxiety.    Patient Stated Goals  "I want to be able to walk straight".  She notes she can't get out of the bed well and is unsteady in the morning.     Currently in Pain?  No/denies         [facility] PT Assessment - 10/07/17 1107      Ambulation/Gait   Ambulation/Gait  Yes    Ambulation/Gait Assistance  5: Supervision    Ambulation/Gait Assistance Details  S to ensure safety as pt amb. in guarded manner. Pt reported she feels like she's  "fighting" something (heaviness) for the last 4-5 months.    Ambulation Distance (Feet)  75 Feet x3   x3   Assistive device  None    Gait Pattern  Step-to pattern;Decreased stride length;Decreased arm swing - right;Decreased arm swing - left;Decreased dorsiflexion - left;Decreased dorsiflexion - right;Wide base of support    Ambulation Surface  Level;Indoor    Gait velocity  2.4ft/sec. no AD         Vestibular Assessment - 10/07/17 1114      Vestibular Assessment   General Observation  Pt reports lying down at home is better but supine to sit txfs still cause dizziness. Pt reports she feels unsteady when standing.      Symptom Behavior   Type of Dizziness  Spinning    Frequency of Dizziness  Daily    Duration of Dizziness  <30 sec. for spinning sensation and then woozy afterwards    Aggravating Factors  Lying supine;Supine to sit;Sit to stand    Relieving Factors  Slow movements      Occulomotor Exam   Occulomotor Alignment  Normal    Spontaneous  Absent    Gaze-induced  Absent    Smooth Pursuits  Intact  Saccades  Intact    Comment  Pt reported 5/10 wooziness during smooth pursuits and 3/10 wooziness during saccades, but pt does have trifocals lenses in glasses, so this might explain wooziness. Pt had a hard time performing B HIT (she kept moving head), denied dizziness.      Vestibulo-Occular Reflex   VOR 1 Head Only (x 1 viewing)  Intact and 1-2/10 dizziness.     VOR Cancellation  Normal      Positional Testing   Dix-Hallpike  Dix-Hallpike Right;Dix-Hallpike Left    Sidelying Test  Sidelying Right;Sidelying Left    Horizontal Canal Testing  Horizontal Canal Right;Horizontal Canal Left      Dix-Hallpike Right   Dix-Hallpike Right Duration  none    Dix-Hallpike Right Symptoms  No nystagmus      Sidelying Right   Sidelying Right Duration  none    Sidelying Right Symptoms  No nystagmus      Sidelying Left   Sidelying Left Duration  none    Sidelying Left Symptoms   No nystagmus      Horizontal Canal Right   Horizontal Canal Right Duration  none    Horizontal Canal Right Symptoms  Normal      Horizontal Canal Left   Horizontal Canal Left Duration  none    Horizontal Canal Left Symptoms  Normal      Positional Sensitivities   Supine to Sitting  Lightheadedness              [facility] Adult PT Treatment/Exercise - 10/07/17 1107      Standardized Balance Assessment   Standardized Balance Assessment  Dynamic Gait Index      Dynamic Gait Index   Level Surface  Mild Impairment    Change in Gait Speed  Moderate Impairment    Gait with Horizontal Head Turns  Mild Impairment    Gait with Vertical Head Turns  Moderate Impairment 1-2/10 dizziness during turns and head turns.    1-2/10 dizziness during turns and head turns.    Gait and Pivot Turn  Mild Impairment    Step Over Obstacle  Moderate Impairment    Step Around Obstacles  Moderate Impairment    Steps  Moderate Impairment    Total Score  11             PT Education - 10/07/17 1106    Education provided  Yes    Education Details  PT re-educated pt on BPPV. Pt discussed hypofunction of vestibular system s/p BPPV. PT discussed outcome measure and vestibular exam results and POC.    Person(s) Educated  Patient    Methods  Explanation    Comprehension  Verbalized understanding;Returned demonstration       PT Short Term Goals - 10/07/17 1149      PT SHORT TERM GOAL #1   Title  The patient will be independent with HEP for balance, habituation as needed, and general mobility.    Time  4    Period  Weeks      PT SHORT TERM GOAL #2   Title  The patient will improve Berg balance score from 36/56 to > or equal to 42/56 to demo dec'ing fall risk.    Time  4    Period  Weeks      PT SHORT TERM GOAL #3   Title  The patient will have negative positional testing for R BPPV.    Time  4    Period  Weeks  PT SHORT TERM GOAL #4   Title  The patient will be further assessed for Head  impulse testing, gait speed.    Time  4    Period  Weeks    Status  Achieved      PT SHORT TERM GOAL #5   Title  Provide patient education materials for [employer] clinic (Caren Cadiz, MD) as she requests information about weight loss.    Time  4    Period  Weeks      Additional Short Term Goals   Additional Short Term Goals  Yes      PT SHORT TERM GOAL #6   Title  Pt will improve DGI score to >/=13/24 to decr. falls risk.    Status  New        PT Long Term Goals - 10/07/17 1150      PT LONG TERM GOAL #1   Title  The patient will be indep with post d/c exercise and/or community wellness program.    Time  8    Period  Weeks      PT LONG TERM GOAL #2   Title  The patient will improve Berg score from 36/56 to > or equal to 45/56 to demo decreased risk for falls.    Time  8    Period  Weeks      PT LONG TERM GOAL #3   Title  The patient will have further goals for gaze, gait speed to follow.    Time  8    Period  Weeks    Status  Achieved      PT LONG TERM GOAL #4   Title  Pt will improve gait speed with LRAD to >/=2.39ft/sec. to safely amb. in the community.     Status  New      PT LONG TERM GOAL #5   Title  Pt will improve DGI score to >/=15/24 to decr. falls risk.     Status  New      Additional Long Term Goals   Additional Long Term Goals  Yes      PT LONG TERM GOAL #6   Title  Pt will report 0/10 dizziness during all positional changes to improve safety and QOL.    Status  New            Plan - 10/07/17 1147    Clinical Impression Statement  Pt demonstrated progress as all positional testing negative for BPPV in all canals, including R pBPPV, indicating BPPV is cleared. PT will continue to monitor for BPPV and treat prn. Head impulse testing difficult to assess as pt had difficulty not moving head and keeping eyes open. Pt did report dizziness during vestibular exam and during dynamic gait activities which require incr. vestibular  input, indicating pt has vestibular hypofunction. Pt's DGI score indicates she is at a high falls risk. Pt would continue to benefit from skilled PT to improve safety during functional mobility.     Rehab Potential  Good    PT Frequency  -- 2x/week for 4 weeks, 1x/week for 4 weeks   2x/week for 4 weeks, 1x/week for 4 weeks   PT Treatment/Interventions  ADLs/Self Care Home Management;Therapeutic activities;Therapeutic exercise;Balance training;Neuromuscular re-education;Canalith Repostioning;Vestibular;Gait training;Functional mobility training;Patient/family education    PT Next Visit Plan  Treat BPPV as indicated and  establish HEP.    Consulted and Agree with Plan of Care  Patient       Patient will benefit  from skilled therapeutic intervention in order to improve the following deficits and impairments:  Abnormal gait, Decreased activity tolerance, Decreased balance, Decreased mobility, Decreased strength, Dizziness, Impaired flexibility  Visit Diagnosis: BPPV (benign paroxysmal positional vertigo), right  Other abnormalities of gait and mobility  Unsteadiness on feet     Problem List Patient Active Problem List   Diagnosis Date Noted  . Lumbar stenosis with neurogenic claudication 11/19/2016  . Pancreatitis 03/21/2016  . HTN (hypertension) 03/21/2016  . Anxiety   . Depression   . AKI (acute kidney injury) ([place])     Karlena Luebke L 10/07/2017, 11:52 AM  [facility] [address] [place], [place], [address] Phone: [phone]   Fax:  [phone]  Name: Judy [name] MRN: [id number] Date of Birth: Jan 21, 1941  Geoffry Paradise, PT,DPT 10/07/17 11:52 AM Phone: [phone] Fax: [phone]

## 2017-10-09 ENCOUNTER — Ambulatory Visit: Payer: Medicare Other | Admitting: Rehabilitative and Restorative Service Providers"

## 2017-10-09 ENCOUNTER — Encounter: Payer: Self-pay | Admitting: Rehabilitative and Restorative Service Providers"

## 2017-10-09 VITALS — BP 152/78 | HR 96

## 2017-10-09 DIAGNOSIS — R2681 Unsteadiness on feet: Secondary | ICD-10-CM

## 2017-10-09 DIAGNOSIS — R2689 Other abnormalities of gait and mobility: Secondary | ICD-10-CM

## 2017-10-09 DIAGNOSIS — H8111 Benign paroxysmal vertigo, right ear: Secondary | ICD-10-CM | POA: Diagnosis not present

## 2017-10-09 NOTE — Patient Instructions (Addendum)
Habituation - Tip Card  1.The goal of habituation training is to assist in decreasing symptoms of vertigo, dizziness, or nausea provoked by specific head and body motions. 2.These exercises may initially increase symptoms; however, be persistent and work through symptoms. With repetition and time, the exercises will assist in reducing or eliminating symptoms. 3.Exercises should be stopped and discussed with the therapist if you experience any of the following: - Sudden change or fluctuation in hearing - New onset of ringing in the ears, or increase in current intensity - Any fluid discharge from the ear - Severe pain in neck or back - Extreme nausea  Copyright  VHI. All rights reserved.   Habituation - Sit to Side-Lying   Sit on edge of bed. Lie down onto the right side and hold until dizziness stops, plus 20 seconds.  Return to sitting and wait until dizziness stops, plus 20 seconds.  Repeat to the left side. Repeat sequence 5 times per session. Do 2 sessions per day.  Copyright  VHI. All rights reserved.    Gaze Stabilization - Tip Card  1.Target must remain in focus, not blurry, and appear stationary while head is in motion. 2.Perform exercises with small head movements (45 to either side of midline). 3.Increase speed of head motion so long as target is in focus. 4.If you wear eyeglasses, be sure you can see target through lens (therapist will give specific instructions for bifocal / progressive lenses). 5.These exercises may provoke dizziness or nausea. Work through these symptoms. If too dizzy, slow head movement slightly. Rest between each exercise. 6.Exercises demand concentration; avoid distractions. 7.For safety, perform standing exercises close to a counter, wall, corner, or next to someone.  Copyright  VHI. All rights reserved.   Gaze Stabilization - Standing Feet Apart   Feet shoulder width apart, keeping eyes on target on wall 3 feet away, head level and move head  side to side for 30 seconds.  *Work up to tolerating 60 seconds, as able. Do 2-3 sessions per day.   Copyright  VHI. All rights reserved.   Feet Together, Varied Arm Positions - Eyes Closed    Stand with feet together and arms AT YOUR SIDE.  Close eyes and visualize upright position. Hold __30__ seconds. Repeat _3___ times per session. Do __1-2__ sessions per day.  Copyright  VHI. All rights reserved.   Feet Heel-Toe "Tandem", Varied Arm Positions - Eyes Open    With eyes open, right foot directly in front of the other, arms out, look straight ahead at a stationary object. Hold __30__ seconds. Repeat __3__ times per session. Do _1-2___ sessions per day.  Copyright  VHI. All rights reserved.   Turning in Place: Solid Surface    Standing in place, lead with head and turn slowly making full turns toward left 3 times *hold counter as needed*.  Then repeat to the right.   Allow time to rest in between.   Do __1-2__ sessions per day.  Copyright  VHI. All rights reserved.

## 2017-10-10 NOTE — Therapy (Signed)
[facility] [address] [place], [place], [address] Phone: [phone]   Fax:  [phone]  Physical Therapy Treatment  Patient Details  Name: Judy [name] MRN: [id number] Date of Birth: 06/10/1941 Referring Provider: Levin Erp, MD   Encounter Date: 10/09/2017  PT End of Session - 10/09/17 1224    Visit Number  3    Number of Visits  12    Date for PT Re-Evaluation  12/02/17    Authorization Type  [employer] medicare $30 copay    PT Start Time  1148    PT Stop Time  1228    PT Time Calculation (min)  40 min    Equipment Utilized During Treatment  Gait belt    Activity Tolerance  Patient tolerated treatment well    Behavior During Therapy  [facility] for tasks assessed/performed       Past Medical History:  Diagnosis Date  . Anxiety   . Complication of anesthesia 2003   gallbladder surgery-resp. arrest-stop operation and pt. went to ICU  . Depression   . Hypertension   . Pancreatitis, chronic ([place]) 03/2016  . Sleep apnea     Past Surgical History:  Procedure Laterality Date  . CHOLECYSTECTOMY    . COLONOSCOPY  2013    Vitals:   10/09/17 1225  BP: (!) 152/78  Pulse: 96    Subjective Assessment - 10/09/17 1147    Subjective  The patient reports no vertigo this morning.  She notes some days she experiences a mild sensation of dizziness.     Patient Stated Goals  "I want to be able to walk straight".  She notes she can't get out of the bed well and is unsteady in the morning.     Currently in Pain?  No/denies             Vestibular Assessment - 10/09/17 1152      Vestibular Assessment   General Observation  The patient continues to have some dizziness with mobility.      Sidelying Right   Sidelying Right Duration  Trace of dizziness    Sidelying Right Symptoms  No nystagmus viewed in roomlight   viewed in roomlight     Sidelying Left   Sidelying Left Duration  Mild sensation of dizziness x  seconds.      Sidelying Left Symptoms  No nystagmus viewed in room light   viewed in room light             [facility] Adult PT Treatment/Exercise - 10/09/17 1205      Ambulation/Gait   Ambulation/Gait  Yes    Ambulation/Gait Assistance  5: Supervision    Ambulation/Gait Assistance Details  Dynamic gait activities forward/backwards, heel/toe walking, 360 degree turns + walkign with CGA to supervision    Ambulation Distance (Feet)  500 Feet    Assistive device  None    Ambulation Surface  Level;Indoor      Neuro Re-ed    Neuro Re-ed Details   Corner balance exercises with heel/toe tandem stance x 30 seconds near support surface, eyes closed with feet together.       Vestibular Treatment/Exercise - 10/09/17 1155      Vestibular Treatment/Exercise   Vestibular Treatment Provided  Habituation;Gaze    Habituation Exercises  Laruth Bouchard Daroff;Standing Horizontal Head Turns;360 degree Turns    Gaze Exercises  X1 Viewing Horizontal      Nestor Lewandowsky   Number of Reps   2  Symptom Description   mild increase in symptoms      Standing Horizontal Head Turns   Number of Reps   5    Symptom Description   standing with feet together      360 degree Turns   Number of Reps   3    COMMENT  to each side with dizziness 4/10 to the left.      X1 Viewing Horizontal   Foot Position  30 seconds x 2 reps    Comments  feet apart with cues on head position.            PT Education - 10/10/17 0811    Education provided  Yes    Education Details  HEP: gaze, habituation, feet together + eyes closed, heel/toe, and turns.  Also provided contact information for weight loss/health program through [facility] per patient request.    Person(s) Educated  Patient    Methods  Explanation;Demonstration;Handout    Comprehension  Verbalized understanding;Returned demonstration       PT Short Term Goals - 10/10/17 1610      PT SHORT TERM GOAL #1   Title  The patient will be independent with HEP  for balance, habituation as needed, and general mobility.    Time  4    Period  Weeks      PT SHORT TERM GOAL #2   Title  The patient will improve Berg balance score from 36/56 to > or equal to 42/56 to demo dec'ing fall risk.    Time  4    Period  Weeks      PT SHORT TERM GOAL #3   Title  The patient will have negative positional testing for R BPPV.    Time  4    Period  Weeks      PT SHORT TERM GOAL #4   Title  The patient will be further assessed for Head impulse testing, gait speed.    Time  4    Period  Weeks    Status  Achieved      PT SHORT TERM GOAL #5   Title  Provide patient education materials for [employer] clinic (Caren [place], MD) as she requests information about weight loss.    Time  4    Period  Weeks    Status  Achieved      PT SHORT TERM GOAL #6   Title  Pt will improve DGI score to >/=13/24 to decr. falls risk.    Status  New        PT Long Term Goals - 10/07/17 1150      PT LONG TERM GOAL #1   Title  The patient will be indep with post d/c exercise and/or community wellness program.    Time  8    Period  Weeks      PT LONG TERM GOAL #2   Title  The patient will improve Berg score from 36/56 to > or equal to 45/56 to demo decreased risk for falls.    Time  8    Period  Weeks      PT LONG TERM GOAL #3   Title  The patient will have further goals for gaze, gait speed to follow.    Time  8    Period  Weeks    Status  Achieved      PT LONG TERM GOAL #4   Title  Pt will improve gait speed with LRAD to >/=  2.32ft/sec. to safely amb. in the community.     Status  New      PT LONG TERM GOAL #5   Title  Pt will improve DGI score to >/=15/24 to decr. falls risk.     Status  New      Additional Long Term Goals   Additional Long Term Goals  Yes      PT LONG TERM GOAL #6   Title  Pt will report 0/10 dizziness during all positional changes to improve safety and QOL.    Status  New            Plan - 10/10/17 0813     Clinical Impression Statement  Focused on establishing a HEP today to treat motion sensitivity, gaze, and balance.  PT to continue progressing working towards STGs/LTGs.  Patient noting improvement with dizziness and balance since evaluation.     PT Treatment/Interventions  ADLs/Self Care Home Management;Therapeutic activities;Therapeutic exercise;Balance training;Neuromuscular re-education;Canalith Repostioning;Vestibular;Gait training;Functional mobility training;Patient/family education    PT Next Visit Plan  check HEP, dynamic gait, check STGs, positional symptoms.    Consulted and Agree with Plan of Care  Patient       Patient will benefit from skilled therapeutic intervention in order to improve the following deficits and impairments:  Abnormal gait, Decreased activity tolerance, Decreased balance, Decreased mobility, Decreased strength, Dizziness, Impaired flexibility  Visit Diagnosis: BPPV (benign paroxysmal positional vertigo), right  Other abnormalities of gait and mobility  Unsteadiness on feet     Problem List Patient Active Problem List   Diagnosis Date Noted  . Lumbar stenosis with neurogenic claudication 11/19/2016  . Pancreatitis 03/21/2016  . HTN (hypertension) 03/21/2016  . Anxiety   . Depression   . AKI (acute kidney injury) ([place])     Liberal, PT 10/10/2017, 8:15 AM  [facility] [address] [place], [place], [address] Phone: [phone]   Fax:  [phone]  Name: Judy [name] MRN: [id number] Date of Birth: Apr 18, 1941

## 2017-10-16 ENCOUNTER — Ambulatory Visit: Payer: Medicare Other

## 2017-10-16 DIAGNOSIS — R2681 Unsteadiness on feet: Secondary | ICD-10-CM

## 2017-10-16 DIAGNOSIS — R2689 Other abnormalities of gait and mobility: Secondary | ICD-10-CM

## 2017-10-16 DIAGNOSIS — H8111 Benign paroxysmal vertigo, right ear: Secondary | ICD-10-CM | POA: Diagnosis not present

## 2017-10-16 NOTE — Therapy (Signed)
[facility] [address] [place], [place], [address] Phone: [phone]   Fax:  [phone]  Physical Therapy Treatment  Patient Details  Name: Judy [name] MRN: [id number] Date of Birth: [birth year] Referring Provider: Levin Erp, MD   Encounter Date: 10/16/2017  PT End of Session - 10/16/17 1059    Visit Number  4    Number of Visits  12    Date for PT Re-Evaluation  12/02/17    Authorization Type  [employer] medicare $30 copay    PT Start Time  1015    PT Stop Time  1056    PT Time Calculation (min)  41 min    Equipment Utilized During Treatment  -- min guard to S prn    Activity Tolerance  Patient tolerated treatment well    Behavior During Therapy  [facility] for tasks assessed/performed       Past Medical History:  Diagnosis Date  . Anxiety   . Complication of anesthesia 2003   gallbladder surgery-resp. arrest-stop operation and pt. went to ICU  . Depression   . Hypertension   . Pancreatitis, chronic ([place]) 03/2016  . Sleep apnea     Past Surgical History:  Procedure Laterality Date  . CHOLECYSTECTOMY    . COLONOSCOPY  2013    There were no vitals filed for this visit.  Subjective Assessment - 10/16/17 1017    Subjective  Pt denied falls since last visit. Pt reported no dizziness since last visit.     Pertinent History  Recently diagnosed with diabetes, back surgery (November 19, 2016 with SNF stay @ [facility] afterwards), HTN, anxiety.    Currently in Pain?  No/denies             Vestibular Assessment - 10/16/17 1020      Positional Testing   Sidelying Test  Sidelying Right      Sidelying Right   Sidelying Right Duration  No dizziness or nystagmus noted.    Sidelying Right Symptoms  No nystagmus       Neuro re-ed: Performed balance and gaze stab. HEP with cues and S for technique and safety. Please see pt instructions for details. Pt reported 3-4/10 dizziness after 1/4 turns but it  resolved within 1 minute.       [facility] Adult PT Treatment/Exercise - 10/16/17 1020      Standardized Balance Assessment   Standardized Balance Assessment  Berg Balance Test;Dynamic Gait Index      Berg Balance Test   Sit to Stand  Able to stand without using hands and stabilize independently    Standing Unsupported  Able to stand safely 2 minutes    Sitting with Back Unsupported but Feet Supported on Floor or Stool  Able to sit safely and securely 2 minutes    Stand to Sit  Sits safely with minimal use of hands    Transfers  Able to transfer safely, minor use of hands    Standing Unsupported with Eyes Closed  Able to stand 10 seconds with supervision    Standing Ubsupported with Feet Together  Able to place feet together independently and stand 1 minute safely    From Standing, Reach Forward with Outstretched Arm  Can reach forward >12 cm safely (5")    From Standing Position, Pick up Object from Floor  Unable to pick up shoe, but reaches 2-5 cm (1-2") from shoe and balances independently    From Standing Position, Turn to Look Behind  Over each Shoulder  Looks behind one side only/other side shows less weight shift    Turn 360 Degrees  Able to turn 360 degrees safely in 4 seconds or less    Standing Unsupported, Alternately Place Feet on Step/Stool  Able to complete 4 steps without aid or supervision    Standing Unsupported, One Foot in Front  Able to plae foot ahead of the other independently and hold 30 seconds    Standing on One Leg  Able to lift leg independently and hold 5-10 seconds 7 sec. on each LE    Total Score  47      Dynamic Gait Index   Level Surface  Mild Impairment    Change in Gait Speed  Mild Impairment    Gait with Horizontal Head Turns  Normal    Gait with Vertical Head Turns  Mild Impairment    Gait and Pivot Turn  Normal    Step Over Obstacle  Normal    Step Around Obstacles  Mild Impairment    Steps  Moderate Impairment    Total Score  18      Exercises    Exercises  Knee/Hip      Knee/Hip Exercises: Standing   Other Standing Knee Exercises  Pt attempted mini squats and wall squats with BUE support but reported B knee pain after approx. 3-4 reps. Exercise ceased and will re-attempt another functional LE strengthening activity next session.             PT Education - 10/16/17 1058    Education provided  Yes    Education Details  PT discussed goal progress and outcome measure results. PT reviewed HEP, pt required cues for technique.     Person(s) Educated  Patient    Methods  Explanation;Demonstration;Verbal cues;Handout    Comprehension  Returned demonstration;Verbalized understanding       PT Short Term Goals - 10/16/17 1059      PT SHORT TERM GOAL #1   Title  The patient will be independent with HEP for balance, habituation as needed, and general mobility.    Time  4    Period  Weeks    Status  Partially Met      PT SHORT TERM GOAL #2   Title  The patient will improve Berg balance score from 36/56 to > or equal to 42/56 to demo dec'ing fall risk.    Time  4    Period  Weeks    Status  Achieved      PT SHORT TERM GOAL #3   Title  The patient will have negative positional testing for R BPPV.    Time  4    Period  Weeks    Status  Achieved      PT SHORT TERM GOAL #4   Title  The patient will be further assessed for Head impulse testing, gait speed.    Time  4    Period  Weeks    Status  Achieved      PT SHORT TERM GOAL #5   Title  Provide patient education materials for [employer] clinic (Caren [place], MD) as she requests information about weight loss.    Time  4    Period  Weeks    Status  Achieved      PT SHORT TERM GOAL #6   Title  Pt will improve DGI score to >/=13/24 to decr. falls risk.    Status  Achieved  PT Long Term Goals - 10/16/17 1101      PT LONG TERM GOAL #1   Title  The patient will be indep with post d/c exercise and/or community wellness program.    Time  8     Period  Weeks      PT LONG TERM GOAL #2   Title  The patient will improve Berg score from 36/56 to > or equal to 45/56 to demo decreased risk for falls.    Time  8    Period  Weeks    Status  Achieved      PT LONG TERM GOAL #3   Title  The patient will have further goals for gaze, gait speed to follow.    Time  8    Period  Weeks    Status  Achieved      PT LONG TERM GOAL #4   Title  Pt will improve gait speed with LRAD to >/=2.9f/sec. to safely amb. in the community.     Status  New      PT LONG TERM GOAL #5   Title  Pt will improve DGI score to >/=15/24 to decr. falls risk.     Status  Achieved      PT LONG TERM GOAL #6   Title  Pt will report 0/10 dizziness during all positional changes to improve safety and QOL.    Status  New            Plan - 10/16/17 1059    Clinical Impression Statement  Pt demonstrated progress, as she met STGs 2,3, and 6. Pt partially met STG 1, as she required cues for technique. Pt met DGI and BERG LTGs, will discuss progressing goals with primary PT. Pt's DGI and BERG scores continue to indicate pt is at risk for falls and would benefit from skilled PT to improve safety during functional mobility.     PT Treatment/Interventions  ADLs/Self Care Home Management;Therapeutic activities;Therapeutic exercise;Balance training;Neuromuscular re-education;Canalith Repostioning;Vestibular;Gait training;Functional mobility training;Patient/family education    PT Next Visit Plan  add LE strengthening to  HEP, dynamic gait, update LTGs, positional symptoms.    Consulted and Agree with Plan of Care  Patient       Patient will benefit from skilled therapeutic intervention in order to improve the following deficits and impairments:  Abnormal gait, Decreased activity tolerance, Decreased balance, Decreased mobility, Decreased strength, Dizziness, Impaired flexibility  Visit Diagnosis: Other abnormalities of gait and mobility  BPPV (benign paroxysmal  positional vertigo), right  Unsteadiness on feet     Problem List Patient Active Problem List   Diagnosis Date Noted  . Lumbar stenosis with neurogenic claudication 11/19/2016  . Pancreatitis 03/21/2016  . HTN (hypertension) 03/21/2016  . Anxiety   . Depression   . [place]) [place]     Dharma Pare L 10/16/2017, 11:02 AM  [place] [address]Phone: [phone]  Fax:  [phone] Name: Judy MCQUADEMRN: [id number] of Birth: 7March 17, 1942 JGeoffry Paradise PT,DPT 10/16/17 11:03 AM Phone: [phone]: [phone]

## 2017-10-16 NOTE — Patient Instructions (Addendum)
Gaze Stabilization - Tip Card  1.Target must remain in focus, not blurry, and appear stationary while head is in motion. 2.Perform exercises with small head movements (45 to either side of midline). 3.Increase speed of head motion so long as target is in focus. 4.If you wear eyeglasses, be sure you can see target through lens (therapist will give specific instructions for bifocal / progressive lenses). 5.These exercises may provoke dizziness or nausea. Work through these symptoms. If too dizzy, slow head movement slightly. Rest between each exercise. 6.Exercises demand concentration; avoid distractions. 7.For safety, perform standing exercises close to a counter, wall, corner, or next to someone.  Copyright  VHI. All rights reserved.   Gaze Stabilization - Standing Feet Apart   Feet shoulder width apart, keeping EYES ON TARGET on wall 3 feet away, head level, TUCK CHIN, and move head side to side for 30 seconds.  *Work up to tolerating 60 seconds, as able. Do 2-3 sessions per day.   Copyright  VHI. All rights reserved.   Feet Together, Varied Arm Positions - Eyes Closed    Stand with feet together and arms AT YOUR SIDE.  Close eyes and visualize upright position. Hold __30__ seconds. Repeat _3___ times per session. Do __1-2__ sessions per day.  Copyright  VHI. All rights reserved.   Feet Heel-Toe "Tandem", Varied Arm Positions - Eyes Open    With eyes open, right foot directly in front of the other, arms out, look straight ahead at a stationary object. Hold __30__ seconds. Repeat __3__ times per session. Do _1-2___ sessions per day.  Copyright  VHI. All rights reserved.   Turning in Place: Solid Surface    Standing in place, lead with head and turn slowly making full turns toward left 3 times *hold counter as needed*.  Then repeat to the right.   Allow time to rest in between.   Do __1-2__ sessions per day.  Copyright  VHI. All rights reserved.

## 2017-10-21 ENCOUNTER — Ambulatory Visit: Payer: Medicare Other

## 2017-10-21 DIAGNOSIS — H8111 Benign paroxysmal vertigo, right ear: Secondary | ICD-10-CM

## 2017-10-21 DIAGNOSIS — R2681 Unsteadiness on feet: Secondary | ICD-10-CM

## 2017-10-21 DIAGNOSIS — R2689 Other abnormalities of gait and mobility: Secondary | ICD-10-CM

## 2017-10-21 NOTE — Therapy (Signed)
[facility] [address] [place], [place], [address] Phone: [phone]   Fax:  [phone]  Physical Therapy Treatment  Patient Details  Name: Judy [name] MRN: [id number] Date of Birth: 10-12-1941 Referring Provider: Levin Erp, MD   Encounter Date: 10/21/2017  PT End of Session - 10/21/17 0855    Visit Number  5    Number of Visits  12    Date for PT Re-Evaluation  12/02/17    Authorization Type  [employer] medicare $30 copay    PT Start Time  0804    PT Stop Time  0845    PT Time Calculation (min)  41 min    Activity Tolerance  Patient tolerated treatment well    Behavior During Therapy  [facility] for tasks assessed/performed       Past Medical History:  Diagnosis Date  . Anxiety   . Complication of anesthesia 2003   gallbladder surgery-resp. arrest-stop operation and pt. went to ICU  . Depression   . Hypertension   . Pancreatitis, chronic ([place]) 03/2016  . Sleep apnea     Past Surgical History:  Procedure Laterality Date  . CHOLECYSTECTOMY    . COLONOSCOPY  2013  . Lumbar three-Sacrum one  lumbar decompression; Lumbar three-four, Lumbar four-five,  Lumbar five-Sacrum one  Posterior Lumbar Interbody Fusion; Lumbar three-Sacrum one  Posterior Lumbar Arthrodesis N/A 11/19/2016   Performed by Jovita Gamma, MD at [facility] OR    There were no vitals filed for this visit.  Subjective Assessment - 10/21/17 0806    Subjective  Pt denied falls since last visit. Pt reported some dizziness when she turns to the L side. "It feels like something isn't right but it doesn't last long, about 15-30 seconds." Pt called weight loss MD office and was told she can start a class in 12/2017.    Pertinent History  Recently diagnosed with diabetes, back surgery (November 19, 2016 with SNF stay @ [facility] afterwards), HTN, anxiety.    Patient Stated Goals  "I want to be able to walk straight".  She notes she can't get out of the bed  well and is unsteady in the morning.     Currently in Pain?  No/denies             Vestibular Assessment - 10/21/17 0808      Positional Testing   Sidelying Test  Sidelying Right;Sidelying Left      Sidelying Right   Sidelying Right Duration  No dizziness or nystagmus noted.    Sidelying Right Symptoms  No nystagmus      Sidelying Left   Sidelying Left Duration  No s/s dizziness    Sidelying Left Symptoms  No nystagmus      Positional Sensitivities   Positional Sensitivities Comments  Pt rolled supine from L sidelying and reported mild dizziness < 5seconds but no nystagmus noted.               [facility] Adult PT Treatment/Exercise - 10/21/17 6644      Knee/Hip Exercises: Standing   Other Standing Knee Exercises  Pt attempted mini squats with B UE support on chair but reported B knee pain after approx. 7 reps.       Knee/Hip Exercises: Seated   Long Arc Quad  Strengthening;Both;2 sets;10 reps    Other Seated Knee/Hip Exercises  B hamstring stretch with 30 sec. hold and feet on stool. Cues and demo for technique.  Knee/Hip Exercises: Supine   Bridges  Strengthening;Both;2 sets;10 reps    Other Supine Knee/Hip Exercises  Cues and demo for all exercises, please see pt instructions for HEP details.       Vestibular Treatment/Exercise - 10/21/17 0854      Vestibular Treatment/Exercise   Vestibular Treatment Provided  Gaze    Gaze Exercises  X1 Viewing Horizontal      X1 Viewing Horizontal   Foot Position  30 seconds x 2 reps in standing    Comments  feet apart with cues on head position. see pt instructions for HEP details.          Balance Exercises - 10/21/17 0850      Balance Exercises: Standing   Standing Eyes Closed  Narrow base of support (BOS);Solid surface;2 reps;30 secs    Tandem Stance  Eyes open;Intermittent upper extremity support;2 reps;30 secs    Other Standing Exercises  HEP reviewed in corner, please see pt instructions for details. Cues  and demo for technique, S to ensure safety. Pt also performed 1/4 turns with S and intermittent UE support while turning to the L side.         PT Education - 10/21/17 0854    Education provided  Yes    Education Details  PT reviewed balance/gaze HEP and added strengthening/flexibility to HEP.     Person(s) Educated  Patient    Methods  Explanation;Demonstration;Verbal cues;Handout    Comprehension  Returned demonstration;Verbalized understanding       PT Short Term Goals - 10/16/17 1059      PT SHORT TERM GOAL #1   Title  The patient will be independent with HEP for balance, habituation as needed, and general mobility.    Time  4    Period  Weeks    Status  Partially Met      PT SHORT TERM GOAL #2   Title  The patient will improve Berg balance score from 36/56 to > or equal to 42/56 to demo dec'ing fall risk.    Time  4    Period  Weeks    Status  Achieved      PT SHORT TERM GOAL #3   Title  The patient will have negative positional testing for R BPPV.    Time  4    Period  Weeks    Status  Achieved      PT SHORT TERM GOAL #4   Title  The patient will be further assessed for Head impulse testing, gait speed.    Time  4    Period  Weeks    Status  Achieved      PT SHORT TERM GOAL #5   Title  Provide patient education materials for [employer] clinic (Caren Renova, MD) as she requests information about weight loss.    Time  4    Period  Weeks    Status  Achieved      PT SHORT TERM GOAL #6   Title  Pt will improve DGI score to >/=13/24 to decr. falls risk.    Status  Achieved        PT Long Term Goals - 10/21/17 0857      PT LONG TERM GOAL #1   Title  The patient will be indep with post d/c exercise and/or community wellness program.    Time  8    Period  Weeks      PT LONG TERM GOAL #2  Title  The patient will improve Berg score from 47/56 to > or equal to 51/56 to demo decreased risk for falls.    Baseline  Met on 10/16/17: 47/56, so  revised    Time  8    Period  Weeks    Status  Revised      PT LONG TERM GOAL #3   Title  The patient will have further goals for gaze, gait speed to follow.    Time  8    Period  Weeks    Status  Achieved      PT LONG TERM GOAL #4   Title  Pt will improve gait speed with LRAD to >/=2.21f/sec. to safely amb. in the community.     Status  New      PT LONG TERM GOAL #5   Title  Pt will improve DGI score to >/=20/24 to decr. falls risk.     Baseline  10/16/17: 18/24, so revised    Status  Revised      PT LONG TERM GOAL #6   Title  Pt will report 0/10 dizziness during all positional changes to improve safety and QOL.    Status  New            Plan - 10/21/17 0855    Clinical Impression Statement  All positional testing negative for dizziness and nystagmus. Therefore, PT focused on balance and habituation activities to reduce dizziness. PT also added BLE strengthening/flexibility activities to HEP, as weakness can contribute to balance impairments. Pt would continue to benefit from skilled PT to improve safety during functional mobility. LTGs updated, as pt met LTG BERG and DGI goals on 10/16/17.    PT Treatment/Interventions  ADLs/Self Care Home Management;Therapeutic activities;Therapeutic exercise;Balance training;Neuromuscular re-education;Canalith Repostioning;Vestibular;Gait training;Functional mobility training;Patient/family education    PT Next Visit Plan  Dynamic gait    Consulted and Agree with Plan of Care  Patient       Patient will benefit from skilled therapeutic intervention in order to improve the following deficits and impairments:  Abnormal gait, Decreased activity tolerance, Decreased balance, Decreased mobility, Decreased strength, Dizziness, Impaired flexibility  Visit Diagnosis: Other abnormalities of gait and mobility  BPPV (benign paroxysmal positional vertigo), right  Unsteadiness on feet     Problem List Patient Active Problem List    Diagnosis Date Noted  . Lumbar stenosis with neurogenic claudication 11/19/2016  . Pancreatitis 03/21/2016  . HTN (hypertension) 03/21/2016  . Anxiety   . Depression   . [place] ([place] injury) ([place]     Judy [name] L 10/21/2017, 8:59 AM  [place] [address]Phone: [phone]  Fax:  [phone] Name: NROBYN GALATIMRN: [id number] of Birth: 708-08-1941 JGeoffry Paradise PT,DPT 10/21/17 9:00 AM Phone: [phone]: [phone]

## 2017-10-21 NOTE — Patient Instructions (Addendum)
   Bridge    Lying on back, legs bent 90, feet flat on floor. Tuck in stomach and Press up hips and torso. Hold for __2__ second. Slowly lower back down. Repeat 10 times. Perform 2 sets of 10 reps, 3 days a week.    Copyright  VHI. All rights reserved.   KNEE: Extension, Long Arc Quads - Sitting    Raise leg until knee is straight. Then slowly lower back down.  __10_ reps per set, _2__ sets per day, _3__ days per week  Copyright  VHI. All rights reserved.    HIP: Hamstrings - Short Sitting    Rest leg on raised surface. Keep knee straight. Lift chest. Hold _30__ seconds. _3__ reps per set each leg, _1-2__ sets per day, _7__ days per week  Copyright  VHI. All rights reserved.   Gaze Stabilization - Tip Card  1.Target must remain in focus, not blurry, and appear stationary while head is in motion. 2.Perform exercises with small head movements (45 to either side of midline). 3.Increase speed of head motion so long as target is in focus. 4.If you wear eyeglasses, be sure you can see target through lens (therapist will give specific instructions for bifocal / progressive lenses). 5.These exercises may provoke dizziness or nausea. Work through these symptoms. If too dizzy, slow head movement slightly. Rest between each exercise. 6.Exercises demand concentration; avoid distractions. 7.For safety, perform standing exercises close to a counter, wall, corner, or next to someone.  Copyright  VHI. All rights reserved.  Gaze Stabilization - Standing Feet Apart   Feet shoulder width apart, keeping EYES ON TARGET on wall 3 feet away, head level, TUCK CHIN, and move head side to side for 30 seconds. *Work up to tolerating 60 seconds, as able. Do 2-3 sessions per day.   Copyright  VHI. All rights reserved.  Feet Together, Varied Arm Positions - Eyes Closed    Stand with feet together and arms AT YOUR SIDE. Close eyes and visualize upright position. Hold __30__  seconds. Repeat _3___ times per session. Do __1-2__ sessions per day.  Copyright  VHI. All rights reserved.  Feet Heel-Toe "Tandem", Varied Arm Positions - Eyes Open    With eyes open, right foot directly in front of the other, arms out, look straight ahead at a stationary object. Hold __30__ seconds. Repeat __3__ times per session. Do _1-2___ sessions per day.  Copyright  VHI. All rights reserved.  Turning in Place: Solid Surface    Standing in place, lead with head and turn slowly making full turns toward left 3 times *hold counter as needed*. Then repeat to the right.  Allow time to rest in between.  Do __1-2__ sessions per day.  Copyright  VHI. All rights reserved.

## 2017-10-23 ENCOUNTER — Ambulatory Visit: Payer: Medicare Other

## 2017-10-23 VITALS — BP 147/78 | HR 101

## 2017-10-23 DIAGNOSIS — H8111 Benign paroxysmal vertigo, right ear: Secondary | ICD-10-CM | POA: Diagnosis not present

## 2017-10-23 DIAGNOSIS — R2689 Other abnormalities of gait and mobility: Secondary | ICD-10-CM

## 2017-10-23 DIAGNOSIS — R2681 Unsteadiness on feet: Secondary | ICD-10-CM

## 2017-10-23 NOTE — Patient Instructions (Signed)
Bridge    Lying on back, legs bent 90, feet flat on floor. Tuck in stomach and Press up hips and torso. Hold for __2__ second. Slowly lower back down. Repeat 10 times. Perform 2 sets of 10 reps, 3 days a week.    Copyright  VHI. All rights reserved.

## 2017-10-23 NOTE — Therapy (Signed)
[facility] [address] [place], [place], [address] Phone: [phone]   Fax:  [phone]  Physical Therapy Treatment  Patient Details  Name: Judy [name] MRN: [id number] Date of Birth: 1941-04-21 Referring Provider: Levin Erp, MD   Encounter Date: 10/23/2017  PT End of Session - 10/23/17 1237    Visit Number  6    Number of Visits  12    Date for PT Re-Evaluation  12/02/17    Authorization Type  [employer] medicare $30 copay    PT Start Time  1105    PT Stop Time  1144    PT Time Calculation (min)  39 min    Equipment Utilized During Treatment  Gait belt    Activity Tolerance  Patient tolerated treatment well    Behavior During Therapy  WFL for tasks assessed/performed       Past Medical History:  Diagnosis Date  . Anxiety   . Complication of anesthesia 2003   gallbladder surgery-resp. arrest-stop operation and pt. went to ICU  . Depression   . Hypertension   . Pancreatitis, chronic ([place]) 03/2016  . Sleep apnea     Past Surgical History:  Procedure Laterality Date  . CHOLECYSTECTOMY    . COLONOSCOPY  2013    Vitals:   10/23/17 1111 10/23/17 1141  BP: 134/72 (!) 147/78  Pulse: 87 (!) 101  SpO2:  95%    Subjective Assessment - 10/23/17 1107    Subjective  Pt denied falls since last visit. Pt reported MD incr. BP medication and placed pt out of work until at least 12/20/17. Pt performed bridge HEP but reported knee pain during exercise (performs on bed).     Pertinent History  Recently diagnosed with diabetes, back surgery (November 19, 2016 with SNF stay @ [facility] afterwards), HTN, anxiety.    Patient Stated Goals  "I want to be able to walk straight".  She notes she can't get out of the bed well and is unsteady in the morning.     Currently in Pain?  No/denies                      [facility] Adult PT Treatment/Exercise - 10/23/17 1119      High Level Balance   High Level Balance  Activities  Side stepping;Backward walking;Head turns;Marching forwards    High Level Balance Comments  Performed in // bars with min A-S to ensure safety. 6x20'/activity over blue and red mats with cues to improve weight shifting. Pt denied dizziness during session but reported unsteadiness. Pt required demo and cues for technique. Performed with intermittent UE support.       Exercises   Exercises  Knee/Hip      Knee/Hip Exercises: Supine   Bridges  Strengthening;Both;10 reps;1 set    Bridges Limitations  Performed as pt reported pain while performing bridges at home, no pain noted during session today.              PT Education - 10/23/17 1235    Education provided  Yes    Education Details  PT reviewed bridges on mat, and instructed pt to perform on either the floor (pt states she can use sofa to pull up into standing) or to try sofa if it is less compliant than bed.     Person(s) Educated  Patient    Methods  Explanation;Demonstration;Verbal cues    Comprehension  Verbalized understanding;Returned demonstration  PT Short Term Goals - 10/16/17 1059      PT SHORT TERM GOAL #1   Title  The patient will be independent with HEP for balance, habituation as needed, and general mobility.    Time  4    Period  Weeks    Status  Partially Met      PT SHORT TERM GOAL #2   Title  The patient will improve Berg balance score from 36/56 to > or equal to 42/56 to demo dec'ing fall risk.    Time  4    Period  Weeks    Status  Achieved      PT SHORT TERM GOAL #3   Title  The patient will have negative positional testing for R BPPV.    Time  4    Period  Weeks    Status  Achieved      PT SHORT TERM GOAL #4   Title  The patient will be further assessed for Head impulse testing, gait speed.    Time  4    Period  Weeks    Status  Achieved      PT SHORT TERM GOAL #5   Title  Provide patient education materials for [employer] clinic (Caren Edmond, MD) as she  requests information about weight loss.    Time  4    Period  Weeks    Status  Achieved      PT SHORT TERM GOAL #6   Title  Pt will improve DGI score to >/=13/24 to decr. falls risk.    Status  Achieved        PT Long Term Goals - 10/21/17 0857      PT LONG TERM GOAL #1   Title  The patient will be indep with post d/c exercise and/or community wellness program.    Time  8    Period  Weeks      PT LONG TERM GOAL #2   Title  The patient will improve Berg score from 47/56 to > or equal to 51/56 to demo decreased risk for falls.    Baseline  Met on 10/16/17: 47/56, so revised    Time  8    Period  Weeks    Status  Revised      PT LONG TERM GOAL #3   Title  The patient will have further goals for gaze, gait speed to follow.    Time  8    Period  Weeks    Status  Achieved      PT LONG TERM GOAL #4   Title  Pt will improve gait speed with LRAD to >/=2.72f/sec. to safely amb. in the community.     Status  New      PT LONG TERM GOAL #5   Title  Pt will improve DGI score to >/=20/24 to decr. falls risk.     Baseline  10/16/17: 18/24, so revised    Status  Revised      PT LONG TERM GOAL #6   Title  Pt will report 0/10 dizziness during all positional changes to improve safety and QOL.    Status  New            Plan - 10/23/17 1238    Clinical Impression Statement  Pt demonstrated progress, as she was able to progress to high level balance activities without incr. in dizziness. Pt did feel unsteady during compliant surfaces dynamic activities, 2/2 decr. vestibular input.  PT encouraged pt to cease bridges on bed and trial on non-compliant surface. Continue with POC.     PT Treatment/Interventions  ADLs/Self Care Home Management;Therapeutic activities;Therapeutic exercise;Balance training;Neuromuscular re-education;Canalith Repostioning;Vestibular;Gait training;Functional mobility training;Patient/family education    PT Next Visit Plan  Trial sidelying<>supine txfs. Dynamic  gait over compliant surfaces, SLS activities    Consulted and Agree with Plan of Care  Patient       Patient will benefit from skilled therapeutic intervention in order to improve the following deficits and impairments:  Abnormal gait, Decreased activity tolerance, Decreased balance, Decreased mobility, Decreased strength, Dizziness, Impaired flexibility  Visit Diagnosis: Other abnormalities of gait and mobility  Unsteadiness on feet     Problem List Patient Active Problem List   Diagnosis Date Noted  . Lumbar stenosis with neurogenic claudication 11/19/2016  . Pancreatitis 03/21/2016  . HTN (hypertension) 03/21/2016  . Anxiety   . Depression   . AKI (acute kidney injury) ([place])     Zionna Homewood L 10/23/2017, 12:40 PM  [facility] [address] [place], [place], [address] Phone: [phone]   Fax:  [phone]  Name: YANAI HOBSON MRN: [id number] Date of Birth: 03-04-1941  Geoffry Paradise, PT,DPT 10/23/17 12:41 PM Phone: [phone] Fax: [phone]

## 2017-10-28 ENCOUNTER — Ambulatory Visit: Payer: Medicare Other

## 2017-10-28 DIAGNOSIS — R2689 Other abnormalities of gait and mobility: Secondary | ICD-10-CM

## 2017-10-28 DIAGNOSIS — R2681 Unsteadiness on feet: Secondary | ICD-10-CM

## 2017-10-28 DIAGNOSIS — H8111 Benign paroxysmal vertigo, right ear: Secondary | ICD-10-CM | POA: Diagnosis not present

## 2017-10-28 NOTE — Patient Instructions (Signed)
   Elastic shoe laces for sneakers, so you don't have to tie your shoes.

## 2017-10-28 NOTE — Therapy (Signed)
[facility] [address] [place], [place], [address] Phone: [phone]   Fax:  [phone]  Physical Therapy Treatment  Patient Details  Name: Judy [name] MRN: [id number] Date of Birth: 08-25-1941 Referring Provider: Levin Erp, MD   Encounter Date: 10/28/2017  PT End of Session - 10/28/17 1135    Visit Number  7    Number of Visits  16 (requested add'l 2x/week for 4 weeks vs. 1x/week for last 4 weeks   Date for PT Re-Evaluation  12/02/17    Authorization Type  [employer] medicare $30 copay    PT Start Time  1050    PT Stop Time  4628    PT Time Calculation (min)  41 min    Equipment Utilized During Treatment  Gait belt    Activity Tolerance  Patient tolerated treatment well;No increased pain    Behavior During Therapy  WFL for tasks assessed/performed       Past Medical History:  Diagnosis Date  . Anxiety   . Complication of anesthesia 2003   gallbladder surgery-resp. arrest-stop operation and pt. went to ICU  . Depression   . Hypertension   . Pancreatitis, chronic ([place]) 03/2016  . Sleep apnea     Past Surgical History:  Procedure Laterality Date  . CHOLECYSTECTOMY    . COLONOSCOPY  2013    There were no vitals filed for this visit.  Subjective Assessment - 10/28/17 1052    Subjective  Pt denied falls since last visit but did step on a rock while getting out of the car on 10/26/17. Pt stated she almost cancelled appt. 2/2 RLE. Pt denied dizziness since last visit.     Pertinent History  Recently diagnosed with diabetes, back surgery (November 19, 2016 with SNF stay @ [facility] afterwards), HTN, anxiety.    Patient Stated Goals  "I want to be able to walk straight".  She notes she can't get out of the bed well and is unsteady in the morning.     Currently in Pain?  Yes    Pain Score  8     Pain Location  Leg    Pain Orientation  Right    Pain Descriptors / Indicators  Aching    Pain Type  Acute pain     Pain Onset  In the past 7 days    Pain Frequency  Constant    Aggravating Factors   Pt reported RLE pain started after stepping on a rock while getting out of the car in driveway, pt unsure what makes it worse    Pain Relieving Factors  nothing that she's tried         [facility] PT Assessment - 10/28/17 1119      Functional Gait  Assessment   Gait assessed   Yes    Gait Level Surface  Walks 20 ft, slow speed, abnormal gait pattern, evidence for imbalance or deviates 10-15 in outside of the 12 in walkway width. Requires more than 7 sec to ambulate 20 ft. 7.55 sec,    Change in Gait Speed  Able to smoothly change walking speed without loss of balance or gait deviation. Deviate no more than 6 in outside of the 12 in walkway width.    Gait with Horizontal Head Turns  Performs head turns smoothly with slight change in gait velocity (eg, minor disruption to smooth gait path), deviates 6-10 in outside 12 in walkway width, or uses an assistive device.  Gait with Vertical Head Turns  Performs task with moderate change in gait velocity, slows down, deviates 10-15 in outside 12 in walkway width but recovers, can continue to walk.    Gait and Pivot Turn  Pivot turns safely within 3 sec and stops quickly with no loss of balance.    Step Over Obstacle  Is able to step over one shoe box (4.5 in total height) without changing gait speed. No evidence of imbalance.    Gait with Narrow Base of Support  Ambulates less than 4 steps heel to toe or cannot perform without assistance.    Gait with Eyes Closed  Walks 20 ft, slow speed, abnormal gait pattern, evidence for imbalance, deviates 10-15 in outside 12 in walkway width. Requires more than 9 sec to ambulate 20 ft. 13.7 sec.    Ambulating Backwards  Walks 20 ft, slow speed, abnormal gait pattern, evidence for imbalance, deviates 10-15 in outside 12 in walkway width.    Steps  Two feet to a stair, must use rail.    Total Score  15    FGA comment:  15/30:  indicates pt is at a high falls risk.                   [facility] Adult PT Treatment/Exercise - 10/28/17 1056      Standardized Balance Assessment   Standardized Balance Assessment  Berg Balance Test;Dynamic Gait Index pt required rest breaks 2/2 RLE pain      Berg Balance Test   Sit to Stand  Able to stand without using hands and stabilize independently    Standing Unsupported  Able to stand safely 2 minutes    Sitting with Back Unsupported but Feet Supported on Floor or Stool  Able to sit safely and securely 2 minutes    Stand to Sit  Sits safely with minimal use of hands    Transfers  Able to transfer safely, minor use of hands    Standing Unsupported with Eyes Closed  Able to stand 10 seconds safely    Standing Ubsupported with Feet Together  Able to place feet together independently and stand 1 minute safely    From Standing, Reach Forward with Outstretched Arm  Can reach confidently >25 cm (10") 10"    From Standing Position, Pick up Object from Floor  Unable to pick up shoe, but reaches 2-5 cm (1-2") from shoe and balances independently    From Standing Position, Turn to Look Behind Over each Shoulder  Looks behind one side only/other side shows less weight shift    Turn 360 Degrees  Able to turn 360 degrees safely in 4 seconds or less    Standing Unsupported, Alternately Place Feet on Step/Stool  Able to stand independently and safely and complete 8 steps in 20 seconds    Standing Unsupported, One Foot in Front  Able to plae foot ahead of the other independently and hold 30 seconds    Standing on One Leg  Able to lift leg independently and hold > 10 seconds LLE    Total Score  52      Dynamic Gait Index   Level Surface  Mild Impairment    Change in Gait Speed  Normal    Gait with Horizontal Head Turns  Normal    Gait with Vertical Head Turns  Mild Impairment    Gait and Pivot Turn  Normal    Step Over Obstacle  Normal    Step Around Obstacles  Normal    Steps  Moderate  Impairment    Total Score  20            [facility]: PT Education - 10/28/17 1134    Education provided  Yes    Education Details  PT discussed pt's goal progress and outcome measure results. PT added additional visits (2x/week for 4 weeks) in order to continue to maximize functional gains and improve safety. PT encouraged pt to go to shoe store for supportive shoes with elastic shoelaces (as pt unable to bend down to tie shoes), as pt reports she's had the same shoes for 2-3 years.     Person(s) Educated  Patient    Methods  Explanation    Comprehension  Verbalized understanding       PT Short Term Goals - 10/28/17 1139      PT SHORT TERM GOAL #1   Title  The patient will be independent with HEP for balance, habituation as needed, and general mobility.    Time  4    Period  Weeks    Status  On-going      PT SHORT TERM GOAL #2   Title  The patient will improve Berg balance score from 36/56 to > or equal to 42/56 to demo dec'ing fall risk.    Time  4    Period  Weeks    Status  Achieved      PT SHORT TERM GOAL #3   Title  The patient will have negative positional testing for R BPPV.    Time  4    Period  Weeks    Status  Achieved      PT SHORT TERM GOAL #4   Title  The patient will be further assessed for Head impulse testing, gait speed.    Time  4    Period  Weeks    Status  Achieved      PT SHORT TERM GOAL #5   Title  Provide patient education materials for [employer] clinic (Caren Wickliffe, MD) as she requests information about weight loss.    Time  4    Period  Weeks    Status  Achieved      PT SHORT TERM GOAL #6   Title  Pt will improve DGI score to >/=13/24 to decr. falls risk.    Status  Achieved        PT Long Term Goals - 10/28/17 1139      PT LONG TERM GOAL #1   Title  The patient will be indep with post d/c exercise and/or community wellness program.    Time  8    Period  Weeks      PT LONG TERM GOAL #2   Title  The patient  will improve Berg score from 47/56 to > or equal to 51/56 to demo decreased risk for falls.    Baseline  52/56 on 10/28/17    Time  8    Period  Weeks    Status  Achieved      PT LONG TERM GOAL #3   Title  The patient will have further goals for gaze, gait speed to follow.    Time  8    Period  Weeks    Status  Achieved      PT LONG TERM GOAL #4   Title  Pt will improve gait speed with LRAD to >/=2.61f/sec. to safely amb. in the community.  Status  New      PT LONG TERM GOAL #5   Title  Pt will improve DGI score to >/=20/24 to decr. falls risk.     Baseline  20/24 on 10/28/17    Status  Achieved      Additional Long Term Goals   Additional Long Term Goals  Yes      PT LONG TERM GOAL #6   Title  Pt will report 0/10 dizziness during all positional changes to improve safety and QOL.    Status  New      PT LONG TERM GOAL #7   Title  Pt will improve FGA score to >/=23/30 to decr. falls risk.     Baseline  15/30 on 10/28/17    Status  New            Plan - 10/28/17 1136    Clinical Impression Statement  Pt demonstrated excellent progress, as she met STGs 2-6. PT will assess STG 1 (HEP) next session. Pt met LTGs 2 and 5, therefore, PT added FGA goal. PT sending re-cert to MD for 2x/week for 4 weeks vs. 1x/week for 4 weeks, as pt is demonstrating progress but continues to require more PT. Pt's DGI and BERG score indicate pt is at low risk for falls. However, pt's FGA score indicates pt is at high risk for falls. PT encouraged pt to inform MD about RLE pain after stepping on rock. Pt would continue to benefit from skilled PT to improve safety during functional mobility.     PT Frequency  2x / week 2x/week for 8 weeks total    PT Duration  8 weeks    PT Treatment/Interventions  ADLs/Self Care Home Management;Therapeutic activities;Therapeutic exercise;Balance training;Neuromuscular re-education;Canalith Repostioning;Vestibular;Gait training;Functional mobility  training;Patient/family education    PT Next Visit Plan  Assess STG 1 (HEP) and progress as tolerated. Dynamic gait over compliant surfaces, SLS activities    Consulted and Agree with Plan of Care  Patient       Patient will benefit from skilled therapeutic intervention in order to improve the following deficits and impairments:  Abnormal gait, Decreased activity tolerance, Decreased balance, Decreased mobility, Decreased strength, Dizziness, Impaired flexibility  Visit Diagnosis: Other abnormalities of gait and mobility - Plan: PT plan of care cert/re-cert  Unsteadiness on feet - Plan: PT plan of care cert/re-cert     Problem List Patient Active Problem List   Diagnosis Date Noted  . Lumbar stenosis with neurogenic claudication 11/19/2016  . Pancreatitis 03/21/2016  . HTN (hypertension) 03/21/2016  . Anxiety   . Depression   . AKI (acute kidney injury) ([place])     Tiasha Helvie L 10/28/2017, 11:44 AM  [facility] [address] [place], [place], [address] Phone: [phone]   Fax:  [phone]  Name: IXEL BOEHNING MRN: [id number] Date of Birth: Jul 24, 1941  Geoffry Paradise, PT,DPT 10/28/17 11:44 AM Phone: [phone] Fax: [phone]

## 2017-10-31 ENCOUNTER — Ambulatory Visit: Payer: Medicare Other | Admitting: Rehabilitative and Restorative Service Providers"

## 2017-11-04 ENCOUNTER — Encounter: Payer: Self-pay | Admitting: Rehabilitative and Restorative Service Providers"

## 2017-11-04 ENCOUNTER — Ambulatory Visit: Payer: Medicare Other | Attending: Internal Medicine | Admitting: Rehabilitative and Restorative Service Providers"

## 2017-11-04 ENCOUNTER — Ambulatory Visit (INDEPENDENT_AMBULATORY_CARE_PROVIDER_SITE_OTHER)
Admission: RE | Admit: 2017-11-04 | Discharge: 2017-11-04 | Disposition: A | Discharge status: Home / Self Care | Payer: Medicare Other | Source: Ambulatory Visit | Attending: Internal Medicine | Admitting: Internal Medicine

## 2017-11-04 ENCOUNTER — Ambulatory Visit: Payer: Medicare Other | Admitting: Internal Medicine

## 2017-11-04 ENCOUNTER — Encounter: Payer: Self-pay | Admitting: Internal Medicine

## 2017-11-04 VITALS — BP 118/72 | HR 94 | Ht 60.0 in | Wt 185.0 lb

## 2017-11-04 DIAGNOSIS — R0609 Other forms of dyspnea: Secondary | ICD-10-CM

## 2017-11-04 DIAGNOSIS — R2681 Unsteadiness on feet: Secondary | ICD-10-CM | POA: Diagnosis present

## 2017-11-04 DIAGNOSIS — G4733 Obstructive sleep apnea (adult) (pediatric): Secondary | ICD-10-CM | POA: Insufficient documentation

## 2017-11-04 DIAGNOSIS — R2689 Other abnormalities of gait and mobility: Secondary | ICD-10-CM | POA: Insufficient documentation

## 2017-11-04 DIAGNOSIS — H8111 Benign paroxysmal vertigo, right ear: Secondary | ICD-10-CM | POA: Insufficient documentation

## 2017-11-04 NOTE — Assessment & Plan Note (Signed)
Record indicates remote DVT/PE, which she does not recall.  Her dyspnea on exertion would be consistent with reported 40 pound weight gain since she has been less mobile in the past year. Plan-CXR

## 2017-11-04 NOTE — Assessment & Plan Note (Signed)
She feels better and sleeps better using CPAP.  Download confirms compliance and control at fixed pressure 13 for now.  We will try to find an old sleep study report.  Otherwise we will need to get a home sleep test for supporting documentation.

## 2017-11-04 NOTE — Patient Instructions (Signed)
Heel Cord Stretch    Place one leg forward, bent, other leg behind and straight. Lean forward keeping back heel flat. Hold __30__ seconds while counting out loud. Repeat with other leg. Repeat __3__ times. Do __1-2__ sessions per day.  http://gt2.exer.us/512   Copyright  VHI. All rights reserved.    Iliotibial Band Stretch, Side-Lying    Lie on your left side *you can be in the middle of the bed.  Let your right leg move behind the left and drop down towards the bed.  Hold _30__ seconds.  Repeat _2__ times per session. Do _1-2__ sessions per day.  Copyright  VHI. All rights reserved.    Bridge    Lying on back, legs bent 90, feet flat on floor. Tuck in stomach and Press up hips and torso. Hold for __2__ second. Slowly lower back down. Repeat 10 times. Perform 2 sets of 10 reps, 3 days a week.    Copyright  VHI. All rights reserved.   KNEE: Extension, Long Arc Quads - Sitting    Raise leg until knee is straight. Then slowly lower back down.  __10_ reps per set, _2__ sets per day, _3__ days per week  Copyright  VHI. All rights reserved.    HIP: Hamstrings - Short Sitting    Rest leg on raised surface. Keep knee straight. Lift chest. Hold _30__ seconds. _3__ reps per set each leg, _1-2__ sets per day, _7__ days per week  Copyright  VHI. All rights reserved.   Gaze Stabilization - Tip Card  1.Target must remain in focus, not blurry, and appear stationary while head is in motion. 2.Perform exercises with small head movements (45 to either side of midline). 3.Increase speed of head motion so long as target is in focus. 4.If you wear eyeglasses, be sure you can see target through lens (therapist will give specific instructions for bifocal / progressive lenses). 5.These exercises may provoke dizziness or nausea. Work through these symptoms. If too dizzy, slow head movement slightly. Rest between each exercise. 6.Exercises demand concentration; avoid  distractions. 7.For safety, perform standing exercises close to a counter, wall, corner, or next to someone.  Copyright  VHI. All rights reserved.  Gaze Stabilization - Standing Feet Apart   Feet shoulder width apart, keeping EYES ON TARGET on wall 3 feet away, head level, TUCK CHIN, and move head side to side for 30 seconds. *Work up to tolerating 60 seconds, as able. Do 2-3 sessions per day.   Copyright  VHI. All rights reserved.  Feet Together, Varied Arm Positions - Eyes Closed    Stand with feet together and arms AT YOUR SIDE. Close eyes and visualize upright position. Hold __30__ seconds. Repeat _3___ times per session. Do __1-2__ sessions per day.  Copyright  VHI. All rights reserved.  Feet Heel-Toe "Tandem", Varied Arm Positions - Eyes Open    With eyes open, right foot directly in front of the other, arms out, look straight ahead at a stationary object. Hold __30__ seconds. Repeat __3__ times per session. Do _1-2___ sessions per day.  Copyright  VHI. All rights reserved.  Turning in Place: Solid Surface    Standing in place, lead with head and turn slowly making full turns toward left 3 times *hold counter as needed*. Then repeat to the right.  Allow time to rest in between.  Do __1-2__ sessions per day.  Copyright  VHI. All rights reserved.
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
## 2017-11-04 NOTE — Patient Instructions (Signed)
Order- DME Huey Romans- Continue CPAP 13, mask of choice, humidifier supplies, AirView    Dx OSA  We will try to identify where your diagnostic sleep study was done and get that report.  Order- office spirometry   Dx dyspnea on exertion  Order- CXR     Dyspnea on exertion

## 2017-11-04 NOTE — Therapy (Signed)
[facility] [address] [place], [place], [address] Phone: [phone]   Fax:  [phone]  Physical Therapy Treatment  Patient Details  Name: Judy [name] MRN: [id number] Date of Birth: 11/13/1941 Referring Provider: Levin Erp, MD   Encounter Date: 11/04/2017  PT End of Session - 11/04/17 1403    Visit Number  8    Number of Visits  12    Date for PT Re-Evaluation  12/02/17    Authorization Type  [employer] medicare $30 copay    PT Start Time  1323    PT Stop Time  1403    PT Time Calculation (min)  40 min    Equipment Utilized During Treatment  Gait belt    Activity Tolerance  Patient tolerated treatment well;No increased pain    Behavior During Therapy  WFL for tasks assessed/performed       Past Medical History:  Diagnosis Date  . Anxiety   . Complication of anesthesia 2003   gallbladder surgery-resp. arrest-stop operation and pt. went to ICU  . Depression   . Hypertension   . Pancreatitis, chronic ([place]) 03/2016  . Sleep apnea     Past Surgical History:  Procedure Laterality Date  . CHOLECYSTECTOMY    . COLONOSCOPY  2013    There were no vitals filed for this visit.  Subjective Assessment - 11/04/17 1325    Subjective  The patient reports she is having difficulty picking up items from the floor.  She is using a reacher, but would prefer to be able to reach the floor.   She gets unsteady feeling worse with walking fast and she also has right hip pain.     Pertinent History  Recently diagnosed with diabetes, back surgery (November 19, 2016 with SNF stay @ [facility] afterwards), HTN, anxiety.    Patient Stated Goals  "I want to be able to walk straight".  She notes she can't get out of the bed well and is unsteady in the morning.     Currently in Pain?  No/denies has pain with walking only on right side.             Vestibular Assessment - 11/04/17 1353      Positional Testing   Sidelying  Test  Sidelying Right;Sidelying Left    Horizontal Canal Testing  Horizontal Canal Right;Horizontal Canal Left      Sidelying Right   Sidelying Right Duration  none    Sidelying Right Symptoms  No nystagmus      Sidelying Left   Sidelying Left Duration  trace of report- noting I feel like "I should just lay still and don't move".    Sidelying Left Symptoms  -- no nystagmus viewed in room light              [facility] Adult PT Treatment/Exercise - 11/04/17 1413      Ambulation/Gait   Ambulation/Gait  Yes    Ambulation/Gait Assistance  5: Supervision    Ambulation/Gait Assistance Details  The patient ambulated initially x 115 feet reporting her R proximal hip with discomfort and noting this limits her on a regular basis.  PT performed stretching (see ther ex below).  Returned to walking with patient noting that she feels reduction in R hip discomfort.  Ambulated x 230 feet x 3 reps.     Assistive device  None    Ambulation Surface  Level;Indoor      Self-Care   Self-Care  Other  Self-Care Comments    Other Self-Care Comments   Patient inquired about reducing frequency.  PT recommended that she continue current HEP.  She stated that she didn't feel she needed to continue the balance activities at home, however when asked to perform gaze x 1 adaptation, she could not perform activity without verbal and demo cues.  We diswcussed full review of these exercises next visit.  Plan to see on Wed and ensure solid HEP before dropping frequency.  Her concern is financial copay with 2x/week frequency.       Exercises   Exercises  Knee/Hip      Knee/Hip Exercises: Stretches   Active Hamstring Stretch  Right;Left    Active Hamstring Stretch Limitations  Initially performed supine with gait belt for assisted stretch; PT then performed PROM overpressure with stretching.  Also reviewed from HEP seated hamstring stretch.  Patient notes a "releasing" of hip discomfort right with stretching.    ITB Stretch   Right;3 reps;30 seconds    ITB Stretch Limitations  Standing near countertop to lean with R side; then performed L sidelying with R hip  extended and adducted for IT Band stretch    Piriformis Stretch  Right;Left;2 reps;30 seconds    Piriformis Stretch Limitations  Modified due to lacking ROM to get ankle to knee.  Tried in supine, however felt deep adductor stretch, did in sitting modified due to ROM limitations.    Gastroc Stretch  Right;Left;2 reps;30 seconds    Gastroc Stretch Limitations  standing with cues on foot position    Other Knee/Hip Stretches  Also performed supine hip adductor stretch PROM bilaterally.      Manual Therapy   Manual Therapy  Soft tissue mobilization    Manual therapy comments  Ther Ex:for pain reduction R proximal thigh/hip    Soft tissue mobilization  Used roller to stretch IT Band in sidelying.             PT Education - 11/04/17 1411    Education provided  Yes    Education Details  HEP: Added home stretching including heel cord stretch, IT band stretch to HEP.  *STILL NEED TO CONSOLIDATE HEP DUE TO # OF EXERCISES    Person(s) Educated  Patient    Methods  Explanation;Demonstration;Handout    Comprehension  Verbalized understanding;Returned demonstration       PT Short Term Goals - 11/04/17 1458      PT SHORT TERM GOAL #1   Title  The patient will be independent with HEP for balance, habituation as needed, and general mobility.    Baseline  The patient has not been doing balance activities regularly.  She feels more limited in R hip and shortness of breath during ambulation.  PT focused on stretching for pain reduction today.    Time  4    Period  Weeks    Status  Partially Met      PT SHORT TERM GOAL #2   Title  The patient will improve Berg balance score from 36/56 to > or equal to 42/56 to demo dec'ing fall risk.    Time  4    Period  Weeks    Status  Achieved      PT SHORT TERM GOAL #3   Title  The patient will have negative  positional testing for R BPPV.    Time  4    Period  Weeks    Status  Achieved      PT SHORT TERM  GOAL #4   Title  The patient will be further assessed for Head impulse testing, gait speed.    Time  4    Period  Weeks    Status  Achieved      PT SHORT TERM GOAL #5   Title  Provide patient education materials for [employer] [facility] (Caren Campo Verde, MD) as she requests information about weight loss.    Time  4    Period  Weeks    Status  Achieved      PT SHORT TERM GOAL #6   Title  Pt will improve DGI score to >/=13/24 to decr. falls risk.    Status  Achieved        PT Long Term Goals - 10/28/17 1139      PT LONG TERM GOAL #1   Title  The patient will be indep with post d/c exercise and/or community wellness program.    Time  8    Period  Weeks      PT LONG TERM GOAL #2   Title  The patient will improve Berg score from 47/56 to > or equal to 51/56 to demo decreased risk for falls.    Baseline  52/56 on 10/28/17    Time  8    Period  Weeks    Status  Achieved      PT LONG TERM GOAL #3   Title  The patient will have further goals for gaze, gait speed to follow.    Time  8    Period  Weeks    Status  Achieved      PT LONG TERM GOAL #4   Title  Pt will improve gait speed with LRAD to >/=2.31f/sec. to safely amb. in the community.     Status  New      PT LONG TERM GOAL #5   Title  Pt will improve DGI score to >/=20/24 to decr. falls risk.     Baseline  20/24 on 10/28/17    Status  Achieved      Additional Long Term Goals   Additional Long Term Goals  Yes      PT LONG TERM GOAL #6   Title  Pt will report 0/10 dizziness during all positional changes to improve safety and QOL.    Status  New      PT LONG TERM GOAL #7   Title  Pt will improve FGA score to >/=23/30 to decr. falls risk.     Baseline  15/30 on 10/28/17    Status  New            Plan - 11/04/17 1505    Clinical Impression Statement  PT had plan to review current HEP, however  cc today is R proximal hip pain.  Therefore, addressed as this is impacting balance and mobility.  The patient notes decreasing pain with stretching in therapy.  She also has shortness of breath that limited extended ambulation.  Recommended she f/u with MD regarding shortness of breath as this is limiting functional mobility.     PT Treatment/Interventions  ADLs/Self Care Home Management;Therapeutic activities;Therapeutic exercise;Balance training;Neuromuscular re-education;Canalith Repostioning;Vestibular;Gait training;Functional mobility training;Patient/family education    PT Next Visit Plan  Check balance HEP and consilidate current program.  Dynamic gait over unlevel surfaces, narrowing base of support.    Consulted and Agree with Plan of Care  Patient       Patient will benefit from skilled therapeutic intervention in order to  improve the following deficits and impairments:  Abnormal gait, Decreased activity tolerance, Decreased balance, Decreased mobility, Decreased strength, Dizziness, Impaired flexibility  Visit Diagnosis: Other abnormalities of gait and mobility  Unsteadiness on feet     Problem List Patient Active Problem List   Diagnosis Date Noted  . Lumbar stenosis with neurogenic claudication 11/19/2016  . Pancreatitis 03/21/2016  . HTN (hypertension) 03/21/2016  . Anxiety   . Depression   . AKI (acute kidney injury) ([place])     [place], PT 11/04/2017, 3:09 PM  [facility] [address] [place], [place], [address] Phone: [phone]   Fax:  [phone]  Name: Judy [name] MRN: [id number] Date of Birth: [birth year]

## 2017-11-06 ENCOUNTER — Encounter: Payer: Self-pay | Admitting: Rehabilitative and Restorative Service Providers"

## 2017-11-11 ENCOUNTER — Ambulatory Visit: Payer: Medicare Other

## 2017-11-18 ENCOUNTER — Telehealth: Payer: Self-pay | Admitting: Internal Medicine

## 2017-11-18 ENCOUNTER — Ambulatory Visit: Payer: Medicare Other

## 2017-11-18 DIAGNOSIS — R2689 Other abnormalities of gait and mobility: Secondary | ICD-10-CM | POA: Diagnosis not present

## 2017-11-18 DIAGNOSIS — H8111 Benign paroxysmal vertigo, right ear: Secondary | ICD-10-CM

## 2017-11-18 DIAGNOSIS — R2681 Unsteadiness on feet: Secondary | ICD-10-CM

## 2017-11-18 NOTE — Patient Instructions (Addendum)
Gaze Stabilization - Tip Card  1.Target must remain in focus, not blurry, and appear stationary while head is in motion. 2.Perform exercises with small head movements (45 to either side of midline). 3.Increase speed of head motion so long as target is in focus. 4.If you wear eyeglasses, be sure you can see target through lens (therapist will give specific instructions for bifocal / progressive lenses). 5.These exercises may provoke dizziness or nausea. Work through these symptoms. If too dizzy, slow head movement slightly. Rest between each exercise. 6.Exercises demand concentration; avoid distractions. 7.For safety, perform standing exercises close to a counter, wall, corner, or next to someone.  Copyright  VHI. All rights reserved.  Gaze Stabilization - Standing Feet Apart   Feet shoulder width apart, keeping EYES ON TARGET on wall 3 feet away, head level, TUCK CHIN, and move head side to side for 30 seconds. *Work up to tolerating 60 seconds, as able. Do 2-3 sessions per day.   Copyright  VHI. All rights reserved.   Habituation - Tip Card  1.The goal of habituation training is to assist in decreasing symptoms of vertigo, dizziness, or nausea provoked by specific head and body motions. 2.These exercises may initially increase symptoms; however, be persistent and work through symptoms. With repetition and time, the exercises will assist in reducing or eliminating symptoms. 3.Exercises should be stopped and discussed with the therapist if you experience any of the following: - Sudden change or fluctuation in hearing - New onset of ringing in the ears, or increase in current intensity - Any fluid discharge from the ear - Severe pain in neck or back - Extreme nausea  Copyright  VHI. All rights reserved.   Habituation - Sit to Side-Lying   Sit on edge of bed. Lie down onto the right side and hold until dizziness stops, plus 30 seconds.  Return to sitting and wait until  dizziness stops, plus 30 seconds.  Repeat to the left side. Repeat sequence 5 times per session. Do 2 sessions per day.  Copyright  VHI. All rights reserved.   Piriformis Stretch, Supine    Lie supine, one ankle crossed onto opposite knee. Have foot on mat and gently push top knee away from body. Hold for 30 seconds. Repeat _3__ times per session. Do _2-3__ sessions per day. Perform every day.   Copyright  VHI. All rights reserved.     Iliotibial Band Stretch, Side-Lying    Lie on your left side *you can be in the middle of the bed.  Let your right leg move behind the left and drop down towards the bed.  Hold _30__ seconds.  Repeat _2__ times per session. Do _1-2__ sessions per day.  Copyright  VHI. All rights reserved.     KNEE: Extension, Long Arc Quads - Sitting    Raise leg until knee is straight. Then slowly lower back down.  __10_ reps per set, _2__ sets per day, _3__ days per week  Copyright  VHI. All rights reserved.   **Continue your bridges and balance exercises too.

## 2017-11-18 NOTE — Therapy (Signed)
[facility] [address] [place], [place], [address] Phone: [phone]   Fax:  [phone]  Physical Therapy Treatment  Patient Details  Name: Judy [name] MRN: [id number] Date of Birth: 1940/12/30 Referring Provider: Levin Erp, MD   Encounter Date: 11/18/2017  PT End of Session - 11/18/17 1232    Visit Number  9    Number of Visits  12    Date for PT Re-Evaluation  12/02/17    Authorization Type  [employer] medicare $30 copay    PT Start Time  1146    PT Stop Time  1227    PT Time Calculation (min)  41 min    Activity Tolerance  Patient tolerated treatment well;No increased pain    Behavior During Therapy  WFL for tasks assessed/performed       Past Medical History:  Diagnosis Date  . Anxiety   . Complication of anesthesia 2003   gallbladder surgery-resp. arrest-stop operation and pt. went to ICU  . Depression   . Hypertension   . Pancreatitis, chronic ([place]) 03/2016  . Sleep apnea     Past Surgical History:  Procedure Laterality Date  . CHOLECYSTECTOMY    . COLONOSCOPY  2013    There were no vitals filed for this visit.  Subjective Assessment - 11/18/17 1148    Subjective  Pt denied falls or changes since last visit. Pt reported she tried the HEP but then went to a friend's house for the snow storm and forgot the exercises. Pt reports she able to pick items off of floor easier but still has a hard time putting on shoes (she uses slip on shoes). Pt would like to weigh herself today.     Pertinent History  Recently diagnosed with diabetes, back surgery (November 19, 2016 with SNF stay @ [facility] afterwards), HTN, anxiety.    Patient Stated Goals  "I want to be able to walk straight".  She notes she can't get out of the bed well and is unsteady in the morning.     Currently in Pain?  No/denies          Therex and Neuro re-ed: Pt performed and reviewed strengthening and flexibility HEP. Pt required  cues and demo for technique.  Pt performed and reviewed x1 viewing and habituation HEP to decr. Dizziness. Performed with S for safety. Pt required incr. Time to allow dizziness to settle, dizziness never went above 4/10.  Please see pt instructions for HEP details.  PT highlighted the most importance HEP exercises for pt to perform if she is limited on time.                     PT Education - 11/18/17 1230    Education provided  Yes    Education Details  PT reviewed HEP and highlighted the most important activities for pt to perform, if she doesn't feel like she has enough time to perform during the day. PT also educated pt on the importance of performing HEP, especially since pt reduced PT frequency 2/2 co-pay. PT provided pt with piriformis stretch to assist in flexibility, as pt has difficulty putting on shoes that do not slip on.     Person(s) Educated  Patient    Methods  Explanation;Demonstration;Verbal cues;Handout    Comprehension  Returned demonstration;Verbalized understanding       PT Short Term Goals - 11/04/17 1458      PT SHORT TERM GOAL #1  Title  The patient will be independent with HEP for balance, habituation as needed, and general mobility.    Baseline  The patient has not been doing balance activities regularly.  She feels more limited in R hip and shortness of breath during ambulation.  PT focused on stretching for pain reduction today.    Time  4    Period  Weeks    Status  Partially Met      PT SHORT TERM GOAL #2   Title  The patient will improve Berg balance score from 36/56 to > or equal to 42/56 to demo dec'ing fall risk.    Time  4    Period  Weeks    Status  Achieved      PT SHORT TERM GOAL #3   Title  The patient will have negative positional testing for R BPPV.    Time  4    Period  Weeks    Status  Achieved      PT SHORT TERM GOAL #4   Title  The patient will be further assessed for Head impulse testing, gait speed.    Time  4     Period  Weeks    Status  Achieved      PT SHORT TERM GOAL #5   Title  Provide patient education materials for [employer] clinic (Caren Andrew, MD) as she requests information about weight loss.    Time  4    Period  Weeks    Status  Achieved      PT SHORT TERM GOAL #6   Title  Pt will improve DGI score to >/=13/24 to decr. falls risk.    Status  Achieved        PT Long Term Goals - 10/28/17 1139      PT LONG TERM GOAL #1   Title  The patient will be indep with post d/c exercise and/or community wellness program.    Time  8    Period  Weeks      PT LONG TERM GOAL #2   Title  The patient will improve Berg score from 47/56 to > or equal to 51/56 to demo decreased risk for falls.    Baseline  52/56 on 10/28/17    Time  8    Period  Weeks    Status  Achieved      PT LONG TERM GOAL #3   Title  The patient will have further goals for gaze, gait speed to follow.    Time  8    Period  Weeks    Status  Achieved      PT LONG TERM GOAL #4   Title  Pt will improve gait speed with LRAD to >/=2.44f/sec. to safely amb. in the community.     Status  New      PT LONG TERM GOAL #5   Title  Pt will improve DGI score to >/=20/24 to decr. falls risk.     Baseline  20/24 on 10/28/17    Status  Achieved      Additional Long Term Goals   Additional Long Term Goals  Yes      PT LONG TERM GOAL #6   Title  Pt will report 0/10 dizziness during all positional changes to improve safety and QOL.    Status  New      PT LONG TERM GOAL #7   Title  Pt will improve FGA score to >/=23/30 to  decr. falls risk.     Baseline  15/30 on 10/28/17    Status  New            Plan - 11/18/17 1232    Clinical Impression Statement  Today's skilled session focused on review and condensing pt's HEP. PT also provided pt with piriformis stretch in order to improve hip flexiility and aide with donning shoes. Pt continues to experience dizziness but has not consistently performed HEP,  PT reiterated the importance of performing HEP. Pt reported improvement in reaching objects on floor, therefore, PT did not address. Continue with POC.     PT Treatment/Interventions  ADLs/Self Care Home Management;Therapeutic activities;Therapeutic exercise;Balance training;Neuromuscular re-education;Canalith Repostioning;Vestibular;Gait training;Functional mobility training;Patient/family education    PT Next Visit Plan G-CODE AND PN (IF RENEWED). Review goals and either renew or d/c.    Consulted and Agree with Plan of Care  Patient       Patient will benefit from skilled therapeutic intervention in order to improve the following deficits and impairments:  Abnormal gait, Decreased activity tolerance, Decreased balance, Decreased mobility, Decreased strength, Dizziness, Impaired flexibility  Visit Diagnosis: Other abnormalities of gait and mobility  Unsteadiness on feet  BPPV (benign paroxysmal positional vertigo), right     Problem List Patient Active Problem List   Diagnosis Date Noted  . Obstructive sleep apnea 11/04/2017  . Dyspnea on exertion 11/04/2017  . Lumbar stenosis with neurogenic claudication 11/19/2016  . Pancreatitis 03/21/2016  . HTN (hypertension) 03/21/2016  . Anxiety   . Depression   . AKI (acute kidney injury) ([place])     Haidynn Almendarez L 11/18/2017, 12:34 PM  [facility] [address] [place], [place], [address] Phone: [phone]   Fax:  [phone]  Name: ELISABET GUTZMER MRN: [id number] Date of Birth: [birth year]  Geoffry Paradise, PT,DPT 11/18/17 12:36 PM Phone: [phone] Fax: [phone]

## 2017-11-18 NOTE — Telephone Encounter (Signed)
Notes recorded by Deneise Lever, MD on 11/05/2017 at 8:49 AM EST CXR- no active lung process seen. Nothing new. --------------------------------------- Attempted to contact pt. No answer, no option to leave a message. Will try back.

## 2017-11-19 NOTE — Telephone Encounter (Signed)
atc pt X2, no answer and no vm.  Wcb.

## 2017-11-20 NOTE — Telephone Encounter (Signed)
Patient returned call, CB is [phone].

## 2017-11-20 NOTE — Telephone Encounter (Signed)
Spoke with pt. She is aware of results. Nothing further was needed.  

## 2017-11-20 NOTE — Telephone Encounter (Signed)
Attempted to contact pt. Received a fast busy signal x2. Will try back.  

## 2017-12-04 ENCOUNTER — Encounter: Payer: Self-pay | Admitting: Rehabilitative and Restorative Service Providers"

## 2017-12-06 ENCOUNTER — Encounter: Payer: Self-pay | Admitting: Rehabilitative and Restorative Service Providers"

## 2017-12-06 ENCOUNTER — Ambulatory Visit: Payer: Medicare Other | Attending: Internal Medicine | Admitting: Rehabilitative and Restorative Service Providers"

## 2017-12-06 DIAGNOSIS — H8111 Benign paroxysmal vertigo, right ear: Secondary | ICD-10-CM | POA: Diagnosis present

## 2017-12-06 DIAGNOSIS — R2681 Unsteadiness on feet: Secondary | ICD-10-CM | POA: Insufficient documentation

## 2017-12-06 DIAGNOSIS — R2689 Other abnormalities of gait and mobility: Secondary | ICD-10-CM | POA: Diagnosis present

## 2017-12-06 NOTE — Addendum Note (Signed)
Addended by: Rudell Cobb M on: 12/06/2017 07:12 PM   Modules accepted: Orders

## 2017-12-06 NOTE — Therapy (Signed)
[facility] [address] [place], [place], [address] Phone: [phone]   Fax:  [phone]  Physical Therapy Treatment  Patient Details  Name: Judy [name] MRN: [id number] Date of Birth: 1941-04-14 Referring Provider: Levin Erp, MD   Encounter Date: 12/06/2017  PT End of Session - 12/06/17 1307    Visit Number  10    Number of Visits  16    Date for PT Re-Evaluation  01/05/18    Authorization Type  [employer] medicare $30 copay    PT Start Time  1233    PT Stop Time  1316    PT Time Calculation (min)  43 min    Activity Tolerance  Patient tolerated treatment well;No increased pain    Behavior During Therapy  WFL for tasks assessed/performed       Past Medical History:  Diagnosis Date  . Anxiety   . Complication of anesthesia 2003   gallbladder surgery-resp. arrest-stop operation and pt. went to ICU  . Depression   . Hypertension   . Pancreatitis, chronic ([place]) 03/2016  . Sleep apnea     Past Surgical History:  Procedure Laterality Date  . CHOLECYSTECTOMY    . COLONOSCOPY  2013    There were no vitals filed for this visit.  Subjective Assessment - 12/06/17 1234    Subjective  The patient reports her hip is doing somewhat better.  She has been trying to take longer steps.  She is noticing occasional dizziness (2-3 times/week), but "not enough to make me fall".  She describes a sensation of movement that lasts for a few minutes.  Bending to pick up item from the floor brings on some dizziness.  She notes some difficulties getting underwear and pants on due to difficulty bending forward.   She notes she is now able to get into and out of the bed and is not as unsteady in the morning.  She also feels walking is improved.  She feels most limited in tightness in back and legs and picking items up from the floor.     Pertinent History  Recently diagnosed with diabetes, back surgery (November 19, 2016 with SNF stay @  [facility] afterwards), HTN, anxiety.    Patient Stated Goals  "I want to be able to walk straight".  She notes she can't get out of the bed well and is unsteady in the morning.     Currently in Pain?  No/denies         [facility] PT Assessment - 12/06/17 1309      Functional Gait  Assessment   Gait assessed   Yes    Gait Level Surface  Walks 20 ft in less than 7 sec but greater than 5.5 sec, uses assistive device, slower speed, mild gait deviations, or deviates 6-10 in outside of the 12 in walkway width.    Change in Gait Speed  Able to smoothly change walking speed without loss of balance or gait deviation. Deviate no more than 6 in outside of the 12 in walkway width.    Gait with Horizontal Head Turns  Performs head turns smoothly with slight change in gait velocity (eg, minor disruption to smooth gait path), deviates 6-10 in outside 12 in walkway width, or uses an assistive device.    Gait with Vertical Head Turns  Performs task with slight change in gait velocity (eg, minor disruption to smooth gait path), deviates 6 - 10 in outside 12 in walkway width  or uses assistive device    Gait and Pivot Turn  Pivot turns safely within 3 sec and stops quickly with no loss of balance.    Step Over Obstacle  Is able to step over one shoe box (4.5 in total height) without changing gait speed. No evidence of imbalance.    Gait with Narrow Base of Support  Ambulates 4-7 steps.    Gait with Eyes Closed  Walks 20 ft, slow speed, abnormal gait pattern, evidence for imbalance, deviates 10-15 in outside 12 in walkway width. Requires more than 9 sec to ambulate 20 ft.    Ambulating Backwards  Walks 20 ft, uses assistive device, slower speed, mild gait deviations, deviates 6-10 in outside 12 in walkway width.    Steps  Alternating feet, must use rail.    Total Score  20    FGA comment:  20/30                  [facility] Adult PT Treatment/Exercise - 12/06/17 1615      Self-Care   Self-Care  Other  Self-Care Comments    Other Self-Care Comments   Discussed LE tightness and recommended chair yoga and consideration of other community classes for stretching/flexibility.  The patient notes that she has silver sneakers, but has not checked into this benefit.  PT discussed working towards community transition and greater compliance with HEP.       Neuro Re-ed    Neuro Re-ed Details   Gaze adaptation reviewed, standing balance exercises reviewed (per patient handout).       Exercises   Exercises  Other Exercises    Other Exercises   Reviewed full HEP (see printout from patient instructions).  Modified program to include M,W,Fr more orthopedic type exercises and then Tues, Thursday, Saturday balance/ gaze exercises.  Also performed passive ROM stretching including hip adductor, hamstring and IT band stretching.              PT Education - 12/06/17 1619    Education provided  Yes    Education Details  silver sneakers program; benefit of community exercise; need for compliance with current HEP.    Person(s) Educated  Patient    Methods  Explanation;Demonstration;Handout    Comprehension  Verbalized understanding;Returned demonstration       PT Short Term Goals - 11/04/17 1458      PT SHORT TERM GOAL #1   Title  The patient will be independent with HEP for balance, habituation as needed, and general mobility.    Baseline  The patient has not been doing balance activities regularly.  She feels more limited in R hip and shortness of breath during ambulation.  PT focused on stretching for pain reduction today.    Time  4    Period  Weeks    Status  Partially Met      PT SHORT TERM GOAL #2   Title  The patient will improve Berg balance score from 36/56 to > or equal to 42/56 to demo dec'ing fall risk.    Time  4    Period  Weeks    Status  Achieved      PT SHORT TERM GOAL #3   Title  The patient will have negative positional testing for R BPPV.    Time  4    Period  Weeks     Status  Achieved      PT SHORT TERM GOAL #4   Title  The patient  will be further assessed for Head impulse testing, gait speed.    Time  4    Period  Weeks    Status  Achieved      PT SHORT TERM GOAL #5   Title  Provide patient education materials for [employer] clinic (Caren Hockessin, MD) as she requests information about weight loss.    Time  4    Period  Weeks    Status  Achieved      PT SHORT TERM GOAL #6   Title  Pt will improve DGI score to >/=13/24 to decr. falls risk.    Status  Achieved        PT Long Term Goals - 12/06/17 1308      PT LONG TERM GOAL #1   Title  The patient will be indep with post d/c exercise and/or community wellness program.    Time  8    Period  Weeks    Status  On-going      PT LONG TERM GOAL #2   Title  The patient will improve Berg score from 47/56 to > or equal to 51/56 to demo decreased risk for falls.    Baseline  52/56 on 10/28/17    Time  8    Period  Weeks    Status  Achieved      PT LONG TERM GOAL #3   Title  The patient will have further goals for gaze, gait speed to follow.    Time  8    Period  Weeks    Status  Achieved      PT LONG TERM GOAL #4   Title  Pt will improve gait speed with LRAD to >/=2.40f/sec. to safely amb. in the community.     Baseline  2.73 ft/sec    Status  Achieved      PT LONG TERM GOAL #5   Title  Pt will improve DGI score to >/=20/24 to decr. falls risk.     Baseline  20/24 on 10/28/17    Status  Achieved      PT LONG TERM GOAL #6   Title  Pt will report 0/10 dizziness during all positional changes to improve safety and QOL.    Baseline  no longer has to stop to let it settle, notes it has improved, but still mild with positional changes.     Status  Partially Met      PT LONG TERM GOAL #7   Title  Pt will improve FGA score to >/=23/30 to decr. falls risk.     Baseline  Improved to 20/30 today.     Status  Partially Met      UPDATED LONG TERM GOALS: PT Long Term Goals -  12/06/17 1906      PT LONG TERM GOAL #1   Title  The patient will be indep with post d/c exercise and/or community wellness program.    Time  4    Period  Weeks    Status  Revised    Target Date  01/05/18      PT LONG TERM GOAL #2   Title  The patient will improve FGA from 20/30 up to 23/30 to demonstrate improving functional mobility.    Time  4    Period  Weeks    Status  Revised    Target Date  01/05/18      PT LONG TERM GOAL #3   Title  The patient will demonstrate  bending to pick up objects from the floor for improved ADL function in the home.    Baseline  Notes unable to do this at times.    Time  4    Period  Weeks    Status  Revised    Target Date  01/05/18      PT LONG TERM GOAL #4   Title  The patient will demonstrate being able to reach feet for donning shoes, socks (either bending forward or bringing LE to contralateral knee to donn socks).    Time  4    Period  Weeks    Status  Revised    Target Date  01/05/18            Plan - 12/06/17 1856    Clinical Impression Statement  Today's session emphasized home program and discussion of patient's goals and compliance with regular exercise.  She has significant tightness t/o bilateral LEs with pain and difficulty bending to donn socks, shoes, pants.  PT also educated patient on need for community wellness, available silver sneakers options, and weight loss options per request.    PT discussed focus shifting to home and community activities to transition towards d/c from PT.  Patient agrees with plan of care.     PT Frequency  1x / week    PT Duration  4 weeks    PT Treatment/Interventions  ADLs/Self Care Home Management;Therapeutic activities;Therapeutic exercise;Balance training;Neuromuscular re-education;Canalith Repostioning;Vestibular;Gait training;Functional mobility training;Patient/family education    PT Next Visit Plan  Check HEP, discuss f/u with silver sneakers @ [employer], discuss f/u with Dr. Leafy Ro  for weight loss options.    Consulted and Agree with Plan of Care  Patient       Patient will benefit from skilled therapeutic intervention in order to improve the following deficits and impairments:  Abnormal gait, Decreased activity tolerance, Decreased balance, Decreased mobility, Decreased strength, Dizziness, Impaired flexibility  Visit Diagnosis: Other abnormalities of gait and mobility  Unsteadiness on feet     Problem List Patient Active Problem List   Diagnosis Date Noted  . Obstructive sleep apnea 11/04/2017  . Dyspnea on exertion 11/04/2017  . Lumbar stenosis with neurogenic claudication 11/19/2016  . Pancreatitis 03/21/2016  . HTN (hypertension) 03/21/2016  . Anxiety   . Depression   . AKI (acute kidney injury) ([place])     [place], PT 12/06/2017, 7:06 PM  [facility] [address] [place], [place], [address] Phone: [phone]   Fax:  [phone]  Name: Judy [name] MRN: [id number] Date of Birth: 22-May-1941

## 2017-12-06 NOTE — Patient Instructions (Addendum)
Monday, Wednesday, Friday   Piriformis Stretch, Supine    Lie supine, one ankle crossed onto opposite knee. Have foot on mat and gently push top knee away from body. Hold for 30 seconds. Repeat _3__ times per session. Do _2-3__ sessions per day. Perform every day.  EVEN IF YOU CAN BEGIN BY CROSSING ONE ANKLE TO THE OPPOSITE KNEE  Copyright  VHI. All rights reserved.    Iliotibial Band Stretch, Side-Lying    Lie on your left side *you can be in the middle of the bed. Let your right leg move behind the left and drop down towards the bed. Hold _30__ seconds.  Repeat _2__ times per session. Do _1-2__ sessions per day.  Copyright  VHI. All rights reserved.    Heel Cord Stretch    Place one leg forward, bent, other leg behind and straight. Lean forward keeping back heel flat. Hold __30__ seconds while counting out loud. Repeat with other leg. Repeat __3__ times. Do __1-2__ sessions per day.  http://[email or web address].exer.us/512   Copyright  VHI. All rights reserved.   Bridge    Lying on back, legs bent 90, feet flat on BED. Tuck in stomach and Press up hips and torso. Hold for __2__ second. Slowly lower back down. Repeat 10 times. Perform 2 sets of 10 reps, 3 days a week.    Copyright  VHI. All rights reserved.  KNEE: Extension, Long Arc Quads - Sitting    Raise leg until knee is straight. Then slowly lower back down.  __10_ reps per set, _2__ sets per day, _3__ days per week  Copyright  VHI. All rights reserved.   HIP: Hamstrings - Short Sitting    Rest leg on raised surface. Keep knee straight. Lift chest. Hold _30__ seconds. _3__ reps per set each leg, _1-2__ sets per day, _7__ days per week  Copyright  VHI. All rights reserved.   Tuesday, Thursday, Saturday    Gaze Stabilization - Tip Card  1.Target must remain in focus, not blurry, and appear stationary while head is in motion. 2.Perform exercises with small head movements (45  to either side of midline). 3.Increase speed of head motion so long as target is in focus. 4.If you wear eyeglasses, be sure you can see target through lens (therapist will give specific instructions for bifocal / progressive lenses). 5.These exercises may provoke dizziness or nausea. Work through these symptoms. If too dizzy, slow head movement slightly. Rest between each exercise. 6.Exercises demand concentration; avoid distractions. 7.For safety, perform standing exercises close to a counter, wall, corner, or next to someone.  Copyright  VHI. All rights reserved.  Gaze Stabilization - Standing Feet Apart   Feet shoulder width apart, keeping EYES ON TARGET on wall 3 feet away, head level, TUCK CHIN, and move head side to side for 30 seconds. *Work up to tolerating 60 seconds, as able. Do 2-3 sessions per day.   Copyright  VHI. All rights reserved.    Habituation - Tip Card  1.The goal of habituation training is to assist in decreasing symptoms of vertigo, dizziness, or nausea provoked by specific head and body motions. 2.These exercises may initially increase symptoms; however, be persistent and work through symptoms. With repetition and time, the exercises will assist in reducing or eliminating symptoms. 3.Exercises should be stopped and discussed with the therapist if you experience any of the following: - Sudden change or fluctuation in hearing - New onset of ringing in the ears, or increase in current intensity - Any fluid  discharge from the ear - Severe pain in neck or back - Extreme nausea  Copyright  VHI. All rights reserved.  Habituation - Sit to Side-Lying   Sit on edge of bed. Lie down onto the right side and hold until dizziness stops, plus 30 seconds. Return to sitting and wait until dizziness stops, plus 30 seconds. Repeat to the left side. Repeat sequence 5 times per session. Do 2 sessions per day.  Copyright  VHI. All rights reserved.   Feet  Together, Varied Arm Positions - Eyes Closed    Stand with feet together and arms AT YOUR SIDE. Close eyes and visualize upright position. Hold __30__ seconds. Repeat _3___ times per session. Do __1-2__ sessions per day.  Copyright  VHI. All rights reserved.  Feet Heel-Toe "Tandem", Varied Arm Positions - Eyes Open    With eyes open, right foot directly in front of the other, arms out, look straight ahead at a stationary object. Hold __30__ seconds. Repeat __3__ times per session. Do _1-2___ sessions per day.  Copyright  VHI. All rights reserved.  Turning in Place: Solid Surface    Standing in place, lead with head and turn slowly making full turns toward left 3 times *hold counter as needed*. Then repeat to the right.  Allow time to rest in between.  Do __1-2__ sessions per day.  Copyright  VHI. All rights reserved.   [place]

## 2017-12-20 ENCOUNTER — Encounter: Payer: Self-pay | Admitting: Rehabilitative and Restorative Service Providers"

## 2017-12-20 ENCOUNTER — Ambulatory Visit: Payer: Medicare Other | Admitting: Rehabilitative and Restorative Service Providers"

## 2017-12-20 DIAGNOSIS — H8111 Benign paroxysmal vertigo, right ear: Secondary | ICD-10-CM

## 2017-12-20 DIAGNOSIS — R2689 Other abnormalities of gait and mobility: Secondary | ICD-10-CM | POA: Diagnosis not present

## 2017-12-20 DIAGNOSIS — R2681 Unsteadiness on feet: Secondary | ICD-10-CM

## 2017-12-20 NOTE — Therapy (Signed)
[facility] [address] [place], [place], [address] Phone: [phone]   Fax:  [phone]  Physical Therapy Treatment  Patient Details  Name: Judy [name] MRN: [id number] Date of Birth: 1941-11-12 Referring Provider: Levin Erp, MD   Encounter Date: 12/20/2017  PT End of Session - 12/20/17 1319    Visit Number  11    Number of Visits  16    Date for PT Re-Evaluation  01/05/18    Authorization Type  [employer] medicare $30 copay    PT Start Time  1233    PT Stop Time  1319    PT Time Calculation (min)  46 min    Activity Tolerance  Patient tolerated treatment well;No increased pain    Behavior During Therapy  WFL for tasks assessed/performed       Past Medical History:  Diagnosis Date  . Anxiety   . Complication of anesthesia 2003   gallbladder surgery-resp. arrest-stop operation and pt. went to ICU  . Depression   . Hypertension   . Pancreatitis, chronic ([place]) 03/2016  . Sleep apnea     Past Surgical History:  Procedure Laterality Date  . CHOLECYSTECTOMY    . COLONOSCOPY  2013    There were no vitals filed for this visit.  Subjective Assessment - 12/20/17 1234    Subjective  The patient notes she is doing HEP more regularly at every other day.  She has dec'd pain with walking since doing HEP more regularly.  She notes checking into silver sneakers program and plans to attend [employer] in order to access pool.   She notes no difficulties getting into/out of bed.      Pertinent History  Recently diagnosed with diabetes, back surgery (November 19, 2016 with SNF stay @ [facility] afterwards), HTN, anxiety.    Patient Stated Goals  "I want to be able to walk straight".  She notes she can't get out of the bed well and is unsteady in the morning.     Currently in Pain?  No/denies         [facility] PT Assessment - 12/20/17 1258      Functional Gait  Assessment   Gait assessed   Yes    Gait Level Surface  Walks 20  ft in less than 7 sec but greater than 5.5 sec, uses assistive device, slower speed, mild gait deviations, or deviates 6-10 in outside of the 12 in walkway width.    Change in Gait Speed  Able to smoothly change walking speed without loss of balance or gait deviation. Deviate no more than 6 in outside of the 12 in walkway width.    Gait with Horizontal Head Turns  Performs head turns smoothly with slight change in gait velocity (eg, minor disruption to smooth gait path), deviates 6-10 in outside 12 in walkway width, or uses an assistive device.    Gait with Vertical Head Turns  Performs head turns with no change in gait. Deviates no more than 6 in outside 12 in walkway width.    Gait and Pivot Turn  Pivot turns safely within 3 sec and stops quickly with no loss of balance.    Step Over Obstacle  Is able to step over one shoe box (4.5 in total height) without changing gait speed. No evidence of imbalance.    Gait with Narrow Base of Support  Ambulates 4-7 steps.    Gait with Eyes Closed  Walks 20 ft, slow speed,  abnormal gait pattern, evidence for imbalance, deviates 10-15 in outside 12 in walkway width. Requires more than 9 sec to ambulate 20 ft.    Ambulating Backwards  Walks 20 ft, no assistive devices, good speed, no evidence for imbalance, normal gait    Steps  Alternating feet, must use rail.    Total Score  22    FGA comment:  22/30                  [facility] Adult PT Treatment/Exercise - 12/20/17 1307      Self-Care   Self-Care  Other Self-Care Comments    Other Self-Care Comments   Worked on donning shoes and positioning in order to reach feet.  Patient also reports she uses a tool to wipe after a bowel movement since back surgery > 1 year ago.  PT encouraged trunk rotation and stretching as she should be able to progress through stretching activities to improving independence in ADLs.        Therapeutic Activites    Therapeutic Activities  Other Therapeutic Activities    Other  Therapeutic Activities  Walking and picking up objects.  Patient reports feeling lightheaded.  She holds her breath and bends from waist with  knees locked and is unable to reach objects.  PT emphasized squatting (worked on deeper squats near a mat for support) and then integrated into task of bending to pick up objects.       Exercises   Exercises  Other Exercises    Other Exercises   SUPINE: trunk rotation with leg overpressure x 2 reps, then extending top leg for greater stretch.   Weight bearing through UEs on mat adding UE rotation reaching up to ceiling R and then L x 5 reps with passive overpressure.  Seated trunk rotation.       Knee/Hip Exercises: Stretches   Active Hamstring Stretch  Right;Left    Active Hamstring Stretch Limitations  Performed sitting and then propping one leg up at a time for stretch with overpressure.      Other Knee/Hip Stretches  standing hip adductor stretch standing at countertop, however hurts knees, modified to supine butterfly stretch.  (too tight for seated butterfly).             PT Education - 12/20/17 1552    Education provided  Yes    Education Details  HEP: lumbar rocking, butterfly stretch added    Person(s) Educated  Patient    Methods  Explanation;Demonstration;Handout    Comprehension  Verbalized understanding;Returned demonstration       PT Short Term Goals - 11/04/17 1458      PT SHORT TERM GOAL #1   Title  The patient will be independent with HEP for balance, habituation as needed, and general mobility.    Baseline  The patient has not been doing balance activities regularly.  She feels more limited in R hip and shortness of breath during ambulation.  PT focused on stretching for pain reduction today.    Time  4    Period  Weeks    Status  Partially Met      PT SHORT TERM GOAL #2   Title  The patient will improve Berg balance score from 36/56 to > or equal to 42/56 to demo dec'ing fall risk.    Time  4    Period  Weeks     Status  Achieved      PT SHORT TERM GOAL #3   Title  The patient will have negative positional testing for R BPPV.    Time  4    Period  Weeks    Status  Achieved      PT SHORT TERM GOAL #4   Title  The patient will be further assessed for Head impulse testing, gait speed.    Time  4    Period  Weeks    Status  Achieved      PT SHORT TERM GOAL #5   Title  Provide patient education materials for [employer] clinic (Caren Linn [place], MD) as she requests information about weight loss.    Time  4    Period  Weeks    Status  Achieved      PT SHORT TERM GOAL #6   Title  Pt will improve DGI score to >/=13/24 to decr. falls risk.    Status  Achieved        PT Long Term Goals - 12/20/17 1238      PT LONG TERM GOAL #1   Title  The patient will be indep with post d/c exercise and/or community wellness program.    Time  4    Period  Weeks    Status  Revised      PT LONG TERM GOAL #2   Title  The patient will improve FGA from 20/30 up to 23/30 to demonstrate improving functional mobility.    Baseline  22/30 on 12/20/17    Time  4    Period  Weeks    Status  Revised      PT LONG TERM GOAL #3   Title  The patient will demonstrate bending to pick up objects from the floor for improved ADL function in the home.    Baseline  Notes unable to do this at times.    Time  4    Period  Weeks    Status  Revised      PT LONG TERM GOAL #4   Title  The patient will demonstrate being able to reach feet for donning shoes, socks (either bending forward or bringing LE to contralateral knee to donn socks).    Time  4    Period  Weeks    Status  Revised            Plan - 12/20/17 1557    Clinical Impression Statement  The patient reported today for first time that she has goals of being able to donn socks and wipe herself after a bowel movement and has not been able to do either of these things since back surgery.  Her pain level has reduced since being more compliant with  HEP.  PT encouraging participation in [facility] program for silver sneakers and daily stretching.  Will f/u for one more visit to assess new HEP added and ensure going well.  Patient notes dizziness and imbalance significantly better and now she feels most limited by tightness.     PT Treatment/Interventions  ADLs/Self Care Home Management;Therapeutic activities;Therapeutic exercise;Balance training;Neuromuscular re-education;Canalith Repostioning;Vestibular;Gait training;Functional mobility training;Patient/family education    PT Next Visit Plan  check HEP, stretching, picking up items from the floor, movement/ROM for wiping after bowel movement.    Consulted and Agree with Plan of Care  Patient       Patient will benefit from skilled therapeutic intervention in order to improve the following deficits and impairments:  Abnormal gait, Decreased activity tolerance, Decreased balance, Decreased mobility, Decreased strength, Dizziness, Impaired flexibility  Visit  Diagnosis: Other abnormalities of gait and mobility  Unsteadiness on feet  BPPV (benign paroxysmal positional vertigo), right     Problem List Patient Active Problem List   Diagnosis Date Noted  . Obstructive sleep apnea 11/04/2017  . Dyspnea on exertion 11/04/2017  . Lumbar stenosis with neurogenic claudication 11/19/2016  . Pancreatitis 03/21/2016  . HTN (hypertension) 03/21/2016  . Anxiety   . Depression   . AKI (acute kidney injury) ([place])     Rulo , PT 12/20/2017, 3:59 PM  [name] [address] [place], [place], [address] Phone: [phone]   Fax:  [phone]  Name: Judy [name] MRN: [id number] Date of Birth: 03/31/1941

## 2017-12-20 NOTE — Patient Instructions (Addendum)
Piriformis Stretch, Supine    Lie supine, one ankle crossed onto opposite knee. Have foot on mat and gently push top knee away from body. Hold for 30 seconds. Repeat _3__ times per session. Do _2-3__ sessions per day. Perform every day.  EVEN IF YOU CAN BEGIN BY CROSSING ONE ANKLE TO THE OPPOSITE KNEE  Copyright  VHI. All rights reserved.   Iliotibial Band Stretch, Side-Lying    Lie on your left side *you can be in the middle of the bed. Let your right leg move behind the left and drop down towards the bed. Hold _30__ seconds.  Repeat _2__ times per session. Do _1-2__ sessions per day.  Copyright  VHI. All rights reserved.    Heel Cord Stretch    Place one leg forward, bent, other leg behind and straight. Lean forward keeping back heel flat. Hold __30__ seconds while counting out loud. Repeat with other leg. Repeat __3__ times. Do __1-2__ sessions per day.  http://gt2.exer.us/512  Copyright  VHI. All rights reserved.  Bridge    Lying on back, legs bent 90, feet flat on BED. Tuck in stomach and Press up hips and torso. Hold for __2__ second. Slowly lower back down. Repeat 10 times. Perform 2 sets of 10 reps, 3 days a week.    Copyright  VHI. All rights reserved.  KNEE: Extension, Long Arc Quads - Sitting    Raise leg until knee is straight. Then slowly lower back down.  __10_ reps per set, _2__ sets per day, _3__ days per week  Copyright  VHI. All rights reserved.   HIP: Hamstrings - Short Sitting    Rest leg on raised surface. Keep knee straight. Lift chest. Hold _30__ seconds. _3__ reps per set each leg, _1-2__ sets per day, _7__ days per week  Copyright  VHI. All rights reserved.   Lower Trunk Rotation / Pelvic Opener    Lying down on your back with knees bent.  Keep knees together on one side, Open legs as far as possible, then rotate to other side. Hold each position _10__ seconds. Repeat _5__ times. Do _1__  sessions per day.  Copyright  VHI. All rights reserved.   Protraction/Retraction (Active With Trunk Rotation)    Stand or sit with erect posture on a firm surface. Clasp hands together with arms at shoulder level and rotate to the right as far as possible. Return to center. Rotate to left.  CAN ALSO REACH BEHIND YOU WITH ONE ARM AT A TIME.  Repeat __10__ times. Do __1__ sessions per day. Activity: Reach for object on either side.  Copyright  VHI. All rights reserved.

## 2018-01-01 ENCOUNTER — Encounter: Payer: Self-pay | Admitting: Rehabilitative and Restorative Service Providers"

## 2018-01-02 ENCOUNTER — Ambulatory Visit: Payer: Medicare Other | Admitting: Rehabilitative and Restorative Service Providers"

## 2018-01-06 ENCOUNTER — Encounter: Payer: Self-pay | Admitting: Internal Medicine

## 2018-01-06 ENCOUNTER — Ambulatory Visit: Payer: Medicare Other | Admitting: Internal Medicine

## 2018-01-06 VITALS — BP 114/76 | HR 76 | Ht 60.0 in | Wt 185.4 lb

## 2018-01-06 DIAGNOSIS — G4733 Obstructive sleep apnea (adult) (pediatric): Secondary | ICD-10-CM | POA: Diagnosis not present

## 2018-01-06 DIAGNOSIS — R0609 Other forms of dyspnea: Secondary | ICD-10-CM
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
## 2018-01-06 NOTE — Assessment & Plan Note (Signed)
She benefits from CPAP "cannot sleep without it" and comfortable with pressure 13.  Needs new supplies.

## 2018-01-06 NOTE — Patient Instructions (Signed)
We can continue CPAP 13, mask of choice humidifier, supplies, AirView  Please call if we can help

## 2018-01-06 NOTE — Assessment & Plan Note (Signed)
She has joined [employer] to exercise regularly and I encouraged this.  Deconditioning is the most likely reason for her dyspnea, which is unobtrusive now. We can do formal PFT and 6-minute walk test in the future if needed.

## 2018-01-16 ENCOUNTER — Encounter (INDEPENDENT_AMBULATORY_CARE_PROVIDER_SITE_OTHER): Payer: Medicare Other

## 2018-01-20 ENCOUNTER — Encounter (INDEPENDENT_AMBULATORY_CARE_PROVIDER_SITE_OTHER): Payer: Self-pay | Admitting: Family Medicine

## 2018-01-20 ENCOUNTER — Ambulatory Visit (INDEPENDENT_AMBULATORY_CARE_PROVIDER_SITE_OTHER): Payer: Medicare Other | Admitting: Family Medicine

## 2018-01-20 VITALS — BP 138/76 | HR 83 | Temp 98.0°F | Ht 60.0 in | Wt 183.0 lb

## 2018-01-20 DIAGNOSIS — E1122 Type 2 diabetes mellitus with diabetic chronic kidney disease: Secondary | ICD-10-CM | POA: Insufficient documentation

## 2018-01-20 DIAGNOSIS — Z1331 Encounter for screening for depression: Secondary | ICD-10-CM

## 2018-01-20 DIAGNOSIS — Z6835 Body mass index (BMI) 35.0-35.9, adult: Secondary | ICD-10-CM | POA: Diagnosis not present

## 2018-01-20 DIAGNOSIS — E119 Type 2 diabetes mellitus without complications: Secondary | ICD-10-CM

## 2018-01-20 DIAGNOSIS — R0602 Shortness of breath: Secondary | ICD-10-CM

## 2018-01-20 DIAGNOSIS — R5383 Other fatigue: Secondary | ICD-10-CM | POA: Diagnosis not present

## 2018-01-20 DIAGNOSIS — I10 Essential (primary) hypertension: Secondary | ICD-10-CM

## 2018-01-20 DIAGNOSIS — E538 Deficiency of other specified B group vitamins: Secondary | ICD-10-CM

## 2018-01-20 DIAGNOSIS — E559 Vitamin D deficiency, unspecified: Secondary | ICD-10-CM

## 2018-01-20 DIAGNOSIS — Z0289 Encounter for other administrative examinations: Secondary | ICD-10-CM

## 2018-01-20 NOTE — Progress Notes (Signed)
.  Office: [phone]  /  Fax: [phone]   HPI:   Chief Complaint: OBESITY  Judy [name] (MR# [id number]) is a 77 y.o. female who presents on 01/20/2018 for obesity evaluation and treatment. Current BMI is Body mass index is 35.74 kg/m.Judy [name] Judy [name] has struggled with obesity for years and has been unsuccessful in either losing weight or maintaining long term weight loss. Judy [name] attended our information session and states she is currently in the action stage of change and ready to dedicate time achieving and maintaining a healthier weight.  Judy [name] states her desired weight loss is 67 lbs she started gaining weight after the birth of her son she has significant food cravings issues  she snacks frequently in the evenings she wakes up frequently in the middle of the night to eat she skips meals frequently she frequently makes poor food choices she has problems with excessive hunger  she frequently eats larger portions than normal  she has binge eating behaviors she struggles with emotional eating    Judy [name] feels her energy is lower than it should be. This has worsened with weight gain and has not worsened recently. Judy [name] admits to daytime somnolence and admits to waking up still tired. Patient has a diagnosis of obstructive sleep apnea which may be contributing to her Judy. Patent has a history of symptoms of daytime Judy, morning Judy and hypertension. Patient generally gets 6 hours of sleep per night, and states they generally have restless sleep. Snoring is present. Apneic episodes are present. Epworth Sleepiness Score is 7  Dyspnea on exertion Judy [name] notes increasing shortness of breath with exercising and seems to be worsening over time with weight gain. She notes getting out of breath sooner with activity than she used to. This has not gotten worse recently. Judy [name] denies orthopnea.  Vitamin D deficiency Judy [name] has a diagnosis of vitamin D deficiency. There are no recent  labs. She is not currently taking multi vitamin and admits Judy but denies nausea, vomiting or muscle weakness.  Diabetes II Judy [name] has a diagnosis of diabetes type II. [name] states her fasting BGs range between 140 and 160's and there are no post prandial glucose readings. Judy [name] notes feeling hypoglycemia at times, but doesn't check her blood sugar at those times. There is no recent A1c. She is attempting to work on intensive lifestyle modifications including diet, exercise, and weight loss to help control her blood glucose levels.  Hypertension Judy [name] is a 77 y.o. female with hypertension. Judy [name] denies chest pain or headache. She would like to control hypertension with diet. She is attempting to work on weight loss to help control her blood pressure with the goal of decreasing her risk of heart attack and stroke. Judy [name] blood pressure is stable on medications.  Vitamin B12 Deficiency Judy [name] has a diagnosis of B12 insufficiency and notes Judy. This is a new diagnosis. Judy [name] is not a vegetarian and does not have a previous diagnosis of pernicious anemia. She does not have a history of weight loss surgery.   Depression Screen [name]'s Food and Mood (modified PHQ-9) score was  Depression screen PHQ 2/9 01/20/2018  Decreased Interest 1  Down, Depressed, Hopeless 3  PHQ - 2 Score 4  Altered sleeping 0  Tired, decreased energy 3  Change in appetite 0  Feeling bad or failure about yourself  2  Trouble concentrating 0  Moving slowly or fidgety/restless 0  Suicidal thoughts 0  PHQ-9 Score 9  Difficult doing work/chores  Somewhat difficult    ALLERGIES: Allergies  Allergen Reactions  . No Known Allergies     MEDICATIONS: Current Outpatient Medications on File Prior to Visit  Medication Sig Dispense Refill  . clorazepate (TRANXENE) 3.75 MG tablet Take 3.75 mg by mouth at bedtime.     Judy [name] diltiazem (CARDIZEM CD) 180 MG 24 hr capsule Take 180 mg by mouth daily. Hold for HR  <55 or >105    . imipramine (TOFRANIL) 50 MG tablet Take 50 mg by mouth at bedtime.    . metFORMIN (GLUCOPHAGE) 500 MG tablet Take 500 mg by mouth 2 (two) times daily with a meal.    . NON FORMULARY 1 each by Other route See admin instructions. CPAP machine nightly    . pantoprazole (PROTONIX) 40 MG tablet Take 40 mg by mouth daily.    Judy [name] spironolactone (ALDACTONE) 25 MG tablet Take 25 mg by mouth daily.     No current facility-administered medications on file prior to visit.     PAST MEDICAL HISTORY: Past Medical History:  Diagnosis Date  . Anxiety   . Back pain   . Complication of anesthesia 2003   gallbladder surgery-resp. arrest-stop operation and pt. went to ICU  . Depression   . Diabetes ([place])   . Dyspnea   . Gout   . Hypertension   . Leg edema   . Pancreatitis, chronic ([place]) 03/2016  . Sleep apnea     PAST SURGICAL HISTORY: Past Surgical History:  Procedure Laterality Date  . CHOLECYSTECTOMY    . COLONOSCOPY  2013    SOCIAL HISTORY: Social History   Tobacco Use  . Smoking status: Never Smoker  . Smokeless tobacco: Never Used  Substance Use Topics  . Alcohol use: No    Alcohol/week: 0.0 oz  . Drug use: No    FAMILY HISTORY: Family History  Problem Relation Age of Onset  . Dementia Mother   . AAA (abdominal aortic aneurysm) Father   . Breast cancer Maternal Aunt     ROS: Review of Systems  Constitutional: Positive for malaise/Judy.  HENT: Positive for hearing loss.        Dry Mouth  Eyes:       Wear Glasses or Contacts  Respiratory: Positive for shortness of breath.   Cardiovascular: Negative for chest pain and orthopnea.       Shortness of Breath on exertion Sudden Awakening from Sleep with Shortness of Breath Calf/Leg Pain with Walking  Gastrointestinal: Negative for nausea and vomiting.  Musculoskeletal:       Negative for muscle weakness Muscle or Joint Pain  Skin:       Dryness   Neurological: Negative for headaches.    Endo/Heme/Allergies: Positive for polydipsia.       Excessive Hunger  Psychiatric/Behavioral: Positive for depression. The patient has insomnia.        Stress    PHYSICAL EXAM: Blood pressure 138/76, pulse 83, temperature 98 F (36.7 C), temperature source Oral, height 5' (1.524 m), weight 183 lb (83 kg), SpO2 93 %. Body mass index is 35.74 kg/m. Physical Exam  Constitutional: She is oriented to person, place, and time. She appears well-developed and well-nourished.  HENT:  Head: Normocephalic and atraumatic.  Nose: Nose normal.  Eyes: EOM are normal. No scleral icterus.  Neck: Normal range of motion. Neck supple. No thyromegaly present.  Cardiovascular: Normal rate and regular rhythm.  Pulmonary/Chest: Effort normal. No respiratory distress.  Abdominal: Soft. There is no tenderness.  + obesity  Musculoskeletal: Normal range of motion.  Range of Motion normal in all 4 extremities  Neurological: She is alert and oriented to person, place, and time. Coordination normal.  Skin: Skin is warm and dry.  Psychiatric: She has a normal mood and affect. Her behavior is normal.  Vitals reviewed.   RECENT LABS AND TESTS: BMET    Component Value Date/Time   NA 134 (A) 12/11/2016   K 5.0 12/11/2016   CL 104 11/12/2016 1334   CO2 25 11/12/2016 1334   GLUCOSE 111 (H) 11/12/2016 1334   BUN 40 (A) 12/11/2016   CREATININE 2.2 (A) 12/11/2016   CREATININE 1.61 (H) 11/12/2016 1334   CALCIUM 9.3 11/12/2016 1334   GFRNONAA 30 (L) 11/12/2016 1334   GFRAA 35 (L) 11/12/2016 1334   Lab Results  Component Value Date   HGBA1C 6.2 (H) 03/22/2016   No results found for: INSULIN CBC    Component Value Date/Time   WBC 10.9 12/06/2016   WBC 10.8 (H) 11/12/2016 1334   RBC 4.36 11/12/2016 1334   HGB 9.4 (A) 12/06/2016   HCT 30 (A) 12/06/2016   PLT 346 12/06/2016   MCV 92.9 11/12/2016 1334   MCH 30.7 11/12/2016 1334   MCHC 33.1 11/12/2016 1334   RDW 12.3 11/12/2016 1334   LYMPHSABS 1.1  03/16/2016 2347   MONOABS 0.6 03/16/2016 2347   EOSABS 0.1 03/16/2016 2347   BASOSABS 0.0 03/16/2016 2347   Iron/TIBC/Ferritin/ %Sat No results found for: IRON, TIBC, FERRITIN, IRONPCTSAT Lipid Panel  No results found for: CHOL, TRIG, HDL, CHOLHDL, VLDL, LDLCALC, LDLDIRECT Hepatic Function Panel     Component Value Date/Time   PROT 5.9 (L) 03/24/2016 0351   ALBUMIN 3.3 (L) 03/24/2016 0351   AST 13 (L) 03/24/2016 0351   ALT 16 03/24/2016 0351   ALKPHOS 81 03/24/2016 0351   BILITOT <0.1 (L) 03/24/2016 0351   No results found for: TSH Vitamin D There are no recent labs  ECG  shows NSR with a rate of 80 BPM INDIRECT CALORIMETER done today shows a VO2 of 245 and a REE of 1710. Her calculated basal metabolic rate is 9242 thus her basal metabolic rate is better than expected.    ASSESSMENT AND PLAN: Other Judy - Plan: EKG 12-Lead, CBC With Differential, Lipid Panel With LDL/HDL Ratio, T3, T4, free, TSH  Shortness of breath on exertion - Plan: CBC With Differential  Type 2 diabetes mellitus without complication, without long-term current use of insulin (HCC) - Plan: Comprehensive metabolic panel, Insulin, random, Hemoglobin A1c  Vitamin D deficiency - Plan: VITAMIN D 25 Hydroxy (Vit-D Deficiency, Fractures)  Essential hypertension - Plan: Lipid Panel With LDL/HDL Ratio  B12 nutritional deficiency - Plan: Vitamin B12  Depression screening  Class 2 severe obesity with serious comorbidity and body mass index (BMI) of 35.0 to 35.9 in adult, unspecified obesity type (HCC)  PLAN:  Judy Aundrea was informed that her Judy may be related to obesity, depression or many other causes. Labs will be ordered, and in the meanwhile Felissa has agreed to work on diet, exercise and weight loss to help with Judy. Proper sleep hygiene was discussed including the need for 7-8 hours of quality sleep each night. A sleep study was not ordered based on symptoms and Epworth score.  Dyspnea on  exertion Dareen's shortness of breath appears to be obesity related and exercise induced. She has agreed to work on weight loss and gradually increase exercise to treat her exercise induced shortness of breath. If  Tora follows our instructions and loses weight without improvement of her shortness of breath, we will plan to refer to pulmonology. We will monitor this condition regularly. Ariaunna agrees to this plan.  Vitamin D Deficiency Makaia was informed that low vitamin D levels contributes to Judy and are associated with obesity, breast, and colon cancer.We will check labs and follow. Dorma will follow up for routine testing of vitamin D, at least 2-3 times per year. She was informed of the risk of over-replacement of vitamin D and agrees to not increase her dose unless she discusses this with [name] first.  Diabetes II Clotiel has been given extensive diabetes education by myself today including ideal fasting and post-prandial blood glucose readings, individual ideal Hgb A1c goals and hypoglycemia prevention. We discussed the importance of good blood sugar control to decrease the likelihood of diabetic complications such as nephropathy, neuropathy, limb loss, blindness, coronary artery disease, and death. We discussed the importance of intensive lifestyle modification including diet, exercise and weight loss as the first line treatment for diabetes.Temekia agrees to work on diet and exercise. We will check labs and Zarina will follow up at the agreed upon time.  Hypertension We discussed sodium restriction, working on healthy weight loss, and a regular exercise program as the means to achieve improved blood pressure control. Judy [name] agreed with this plan and agreed to follow up as directed. We will continue to monitor her blood pressure closely as well as her progress with the above lifestyle modifications. She agrees to continue Diltiazem and will watch for signs of hypotension as she continues her lifestyle  modifications.  Vitamin B12 Deficiency Breasia will work on increasing B12 rich foods in her diet. She was placed on a vitamin B12 rich diet today. We will check B12 labs and she agreed to follow up as directed.  Depression Screen Nicloe had a mildly positive depression screening. Depression is commonly associated with obesity and often results in emotional eating behaviors. We will monitor this closely and work on CBT to help improve the non-hunger eating patterns. Referral to Psychology may be required if no improvement is seen as she continues in our clinic.  Obesity Myles is currently in the action stage of change and her goal is to continue with weight loss efforts She has agreed to follow the Category 2 plan +100 calories Jelena has been instructed to work up to a goal of 150 minutes of combined cardio and strengthening exercise per week for weight loss and overall health benefits. We discussed the following Behavioral Modification Strategies today: no skipping meals, increasing lean protein intake and decrease eating out  Alexus has agreed to follow up with our clinic in 2 weeks. She was informed of the importance of frequent follow up visits to maximize her success with intensive lifestyle modifications for her multiple health conditions. She was informed we would discuss her lab results at her next visit unless there is a critical issue that needs to be addressed sooner. Marquis agreed to keep her next visit at the agreed upon time to discuss these results.    OBESITY BEHAVIORAL INTERVENTION VISIT  Today's visit was # 1 out of 22.  Starting weight: 183 lbs Starting date: 01/20/18 Today's weight : 183 lbs  Today's date: 01/20/2018 Total lbs lost to date: 0 (Patients must lose 7 lbs in the first 6 months to continue with counseling)   ASK: We discussed the diagnosis of obesity with [name] today and Alivya agreed to give [name]  permission to discuss obesity behavioral modification  therapy today.  ASSESS: Oliwia has the diagnosis of obesity and her BMI today is 35.74 Zorina is in the action stage of change   ADVISE: Orrie was educated on the multiple health risks of obesity as well as the benefit of weight loss to improve her health. She was advised of the need for long term treatment and the importance of lifestyle modifications.  AGREE: Multiple dietary modification options and treatment options were discussed and  Anber agreed to the above obesity treatment plan.   I, Doreene Nest, am acting as transcriptionist for Dennard Nip, MD  I have reviewed the above documentation for accuracy and completeness, and I agree with the above. -Dennard Nip, MD

## 2018-01-22 LAB — CBC WITH DIFFERENTIAL
Basophils Absolute: 0 10*3/uL (ref 0.0–0.2)
Basos: 0 %
EOS (ABSOLUTE): 0.2 10*3/uL (ref 0.0–0.4)
EOS: 2 %
HEMATOCRIT: 42.6 % (ref 34.0–46.6)
HEMOGLOBIN: 14.7 g/dL (ref 11.1–15.9)
IMMATURE GRANULOCYTES: 0 %
Immature Grans (Abs): 0 10*3/uL (ref 0.0–0.1)
LYMPHS ABS: 1.8 10*3/uL (ref 0.7–3.1)
Lymphs: 23 %
MCH: 30.8 pg (ref 26.6–33.0)
MCHC: 34.5 g/dL (ref 31.5–35.7)
MCV: 89 fL (ref 79–97)
MONOCYTES: 6 %
Monocytes Absolute: 0.5 10*3/uL (ref 0.1–0.9)
Neutrophils Absolute: 5.5 10*3/uL (ref 1.4–7.0)
Neutrophils: 69 %
RBC: 4.77 x10E6/uL (ref 3.77–5.28)
RDW: 13.3 % (ref 12.3–15.4)
WBC: 8 10*3/uL (ref 3.4–10.8)

## 2018-01-22 LAB — T3: T3, Total: 107 ng/dL (ref 71–180)

## 2018-01-22 LAB — COMPREHENSIVE METABOLIC PANEL
ALBUMIN: 4.2 g/dL (ref 3.5–4.8)
ALK PHOS: 117 IU/L (ref 39–117)
ALT: 25 IU/L (ref 0–32)
AST: 14 IU/L (ref 0–40)
Albumin/Globulin Ratio: 1.6 (ref 1.2–2.2)
BUN / CREAT RATIO: 13 (ref 12–28)
BUN: 17 mg/dL (ref 8–27)
Bilirubin Total: 0.2 mg/dL (ref 0.0–1.2)
CO2: 23 mmol/L (ref 20–29)
CREATININE: 1.28 mg/dL — AB (ref 0.57–1.00)
Calcium: 9.5 mg/dL (ref 8.7–10.3)
Chloride: 100 mmol/L (ref 96–106)
GFR calc non Af Amer: 41 mL/min/{1.73_m2} — ABNORMAL LOW (ref >59)
GFR, EST AFRICAN AMERICAN: 47 mL/min/{1.73_m2} — AB (ref >59)
Globulin, Total: 2.7 g/dL (ref 1.5–4.5)
Glucose: 188 mg/dL — ABNORMAL HIGH (ref 65–99)
Potassium: 4.4 mmol/L (ref 3.5–5.2)
Sodium: 139 mmol/L (ref 134–144)
Total Protein: 6.9 g/dL (ref 6.0–8.5)

## 2018-01-22 LAB — LIPID PANEL WITH LDL/HDL RATIO
Cholesterol, Total: 162 mg/dL (ref 100–199)
HDL: 40 mg/dL (ref >39)
LDL CALC: 90 mg/dL (ref 0–99)
LDl/HDL Ratio: 2.3 ratio (ref 0.0–3.2)
Triglycerides: 162 mg/dL — ABNORMAL HIGH (ref 0–149)
VLDL Cholesterol Cal: 32 mg/dL (ref 5–40)

## 2018-01-22 LAB — HEMOGLOBIN A1C
Est. average glucose Bld gHb Est-mCnc: 163 mg/dL
Hgb A1c MFr Bld: 7.3 % — ABNORMAL HIGH (ref 4.8–5.6)

## 2018-01-22 LAB — TSH: TSH: 1.95 u[IU]/mL (ref 0.450–4.500)

## 2018-01-22 LAB — INSULIN, RANDOM: INSULIN: 44.8 u[IU]/mL — AB (ref 2.6–24.9)

## 2018-01-22 LAB — VITAMIN D 25 HYDROXY (VIT D DEFICIENCY, FRACTURES): Vit D, 25-Hydroxy: 6.7 ng/mL — ABNORMAL LOW (ref 30.0–100.0)

## 2018-01-22 LAB — T4, FREE: FREE T4: 1.17 ng/dL (ref 0.82–1.77)

## 2018-01-22 LAB — VITAMIN B12: Vitamin B-12: 328 pg/mL (ref 232–1245)

## 2018-02-04 ENCOUNTER — Ambulatory Visit (INDEPENDENT_AMBULATORY_CARE_PROVIDER_SITE_OTHER): Payer: Medicare Other | Admitting: Family Medicine

## 2018-02-04 VITALS — BP 127/73 | HR 78 | Temp 97.7°F | Ht 60.0 in | Wt 178.0 lb

## 2018-02-04 DIAGNOSIS — Z9189 Other specified personal risk factors, not elsewhere classified: Secondary | ICD-10-CM | POA: Diagnosis not present

## 2018-02-04 DIAGNOSIS — E559 Vitamin D deficiency, unspecified: Secondary | ICD-10-CM

## 2018-02-04 DIAGNOSIS — Z6834 Body mass index (BMI) 34.0-34.9, adult: Secondary | ICD-10-CM

## 2018-02-04 DIAGNOSIS — E1129 Type 2 diabetes mellitus with other diabetic kidney complication: Secondary | ICD-10-CM | POA: Diagnosis not present

## 2018-02-04 DIAGNOSIS — E669 Obesity, unspecified: Secondary | ICD-10-CM

## 2018-02-04 MED ORDER — VITAMIN D (ERGOCALCIFEROL) 1.25 MG (50000 UNIT) PO CAPS: 50000.0000 [IU] | ORAL_CAPSULE | ORAL | 0 refills | Status: DC

## 2018-02-04 NOTE — Progress Notes (Signed)
Office: [phone]  /  Fax: [phone]   HPI:   Chief Complaint: OBESITY Judy [name] is here to discuss her progress with her obesity treatment plan. She is on the Category 2 plan + 100 calories and is following her eating plan approximately 95 % of the time. She states she is exercising 0 minutes 0 times per week. Judy [name] did well with weight loss on her Category 2 plan but really felt deprived without hot dogs and donuts. She is still eating out 2 to 3 times a week but making better choices.  Her weight is 178 lb (80.7 kg) today and has had a weight loss of 5 pounds over a period of 2 weeks since her last visit. She has lost 5 lbs since starting treatment with [name].  Diabetes II Judy [name] has a diagnosis of diabetes type II. Judy [name] states fasting BGs range between 120 and 220, have been low in the last 2 weeks on diet prescription to 140 and 150's on average. She  denies hypoglycemia. GFR 41 on metformin 500 mg BID. Last A1c was 7.3 on 01/21/18. She has been working on intensive lifestyle modifications including diet, exercise, and weight loss to help control her blood glucose levels.  Vitamin D Deficiency Judy [name] has a new diagnosis of vitamin D deficiency. Vit D level very low, she notes fatigue and denies nausea, vomiting or muscle weakness.  At risk for osteopenia and osteoporosis Judy [name] is at higher risk of osteopenia and osteoporosis due to vitamin D deficiency.   ALLERGIES: Allergies  Allergen Reactions  . No Known Allergies     MEDICATIONS: Current Outpatient Medications on File Prior to Visit  Medication Sig Dispense Refill  . clorazepate (TRANXENE) 3.75 MG tablet Take 3.75 mg by mouth at bedtime.     [name] diltiazem (CARDIZEM CD) 180 MG 24 hr capsule Take 180 mg by mouth daily. Hold for HR <55 or >105    . imipramine (TOFRANIL) 50 MG tablet Take 50 mg by mouth at bedtime.    . metFORMIN (GLUCOPHAGE) 500 MG tablet Take 500 mg by mouth 2 (two) times daily with a meal.    . NON FORMULARY 1  each by Other route See admin instructions. CPAP machine nightly    . pantoprazole (PROTONIX) 40 MG tablet Take 40 mg by mouth daily.    [name] spironolactone (ALDACTONE) 25 MG tablet Take 25 mg by mouth daily.     No current facility-administered medications on file prior to visit.     PAST MEDICAL HISTORY: Past Medical History:  Diagnosis Date  . Anxiety   . Back pain   . Complication of anesthesia 2003   gallbladder surgery-resp. arrest-stop operation and pt. went to ICU  . Depression   . Diabetes ([place])   . Dyspnea   . Gout   . Hypertension   . Leg edema   . Pancreatitis, chronic ([place]) 03/2016  . Sleep apnea     PAST SURGICAL HISTORY: Past Surgical History:  Procedure Laterality Date  . CHOLECYSTECTOMY    . COLONOSCOPY  2013    SOCIAL HISTORY: Social History   Tobacco Use  . Smoking status: Never Smoker  . Smokeless tobacco: Never Used  Substance Use Topics  . Alcohol use: No    Alcohol/week: 0.0 oz  . Drug use: No    FAMILY HISTORY: Family History  Problem Relation Age of Onset  . Dementia Mother   . AAA (abdominal aortic aneurysm) Father   . Breast cancer Maternal Aunt  ROS: Review of Systems  Constitutional: Positive for malaise/fatigue and weight loss.  Gastrointestinal: Negative for nausea and vomiting.  Musculoskeletal:       Negative muscle weakness  Endo/Heme/Allergies:       Negative hypoglycemia    PHYSICAL EXAM: Blood pressure 127/73, pulse 78, temperature 97.7 F (36.5 C), temperature source Oral, height 5' (1.524 m), weight 178 lb (80.7 kg), SpO2 98 %. Body mass index is 34.76 kg/m. Physical Exam  Constitutional: She is oriented to person, place, and time. She appears well-developed and well-nourished.  Cardiovascular: Normal rate.  Pulmonary/Chest: Effort normal.  Musculoskeletal: Normal range of motion.  Neurological: She is oriented to person, place, and time.  Skin: Skin is warm and dry.  Psychiatric: She has a normal mood and  affect. Her behavior is normal.  Vitals reviewed.   RECENT LABS AND TESTS: BMET    Component Value Date/Time   NA 139 01/21/2018 0859   K 4.4 01/21/2018 0859   CL 100 01/21/2018 0859   CO2 23 01/21/2018 0859   GLUCOSE 188 (H) 01/21/2018 0859   GLUCOSE 111 (H) 11/12/2016 1334   BUN 17 01/21/2018 0859   CREATININE 1.28 (H) 01/21/2018 0859   CALCIUM 9.5 01/21/2018 0859   GFRNONAA 41 (L) 01/21/2018 0859   GFRAA 47 (L) 01/21/2018 0859   Lab Results  Component Value Date   HGBA1C 7.3 (H) 01/21/2018   HGBA1C 6.2 (H) 03/22/2016   Lab Results  Component Value Date   INSULIN 44.8 (H) 01/21/2018   CBC    Component Value Date/Time   WBC 8.0 01/21/2018 0859   WBC 10.8 (H) 11/12/2016 1334   RBC 4.77 01/21/2018 0859   RBC 4.36 11/12/2016 1334   HGB 14.7 01/21/2018 0859   HCT 42.6 01/21/2018 0859   PLT 346 12/06/2016   MCV 89 01/21/2018 0859   MCH 30.8 01/21/2018 0859   MCH 30.7 11/12/2016 1334   MCHC 34.5 01/21/2018 0859   MCHC 33.1 11/12/2016 1334   RDW 13.3 01/21/2018 0859   LYMPHSABS 1.8 01/21/2018 0859   MONOABS 0.6 03/16/2016 2347   EOSABS 0.2 01/21/2018 0859   BASOSABS 0.0 01/21/2018 0859   Iron/TIBC/Ferritin/ %Sat No results found for: IRON, TIBC, FERRITIN, IRONPCTSAT Lipid Panel     Component Value Date/Time   CHOL 162 01/21/2018 0859   TRIG 162 (H) 01/21/2018 0859   HDL 40 01/21/2018 0859   LDLCALC 90 01/21/2018 0859   Hepatic Function Panel     Component Value Date/Time   PROT 6.9 01/21/2018 0859   ALBUMIN 4.2 01/21/2018 0859   AST 14 01/21/2018 0859   ALT 25 01/21/2018 0859   ALKPHOS 117 01/21/2018 0859   BILITOT 0.2 01/21/2018 0859      Component Value Date/Time   TSH 1.950 01/21/2018 0859  Results for Judy [name] (MRN [id number]) as of 02/04/2018 15:55  Ref. Range 01/21/2018 08:59  Vitamin D, 25-Hydroxy Latest Ref Range: 30.0 - 100.0 ng/mL 6.7 (L)    ASSESSMENT AND PLAN: Vitamin D deficiency - Plan: Vitamin D, Ergocalciferol, (DRISDOL)  50000 units CAPS capsule  Type 2 diabetes mellitus with other diabetic kidney complication, without long-term current use of insulin (HCC)  At risk for osteoporosis  Class 1 obesity with serious comorbidity and body mass index (BMI) of 34.0 to 34.9 in adult, unspecified obesity type  PLAN:  Diabetes II Judy [name] has been given extensive diabetes education by myself today including ideal fasting and post-prandial blood glucose readings, individual ideal Hgb A1c goals and  hypoglycemia prevention. We discussed the importance of good blood sugar control to decrease the likelihood of diabetic complications such as nephropathy, neuropathy, limb loss, blindness, coronary artery disease, and death. We discussed the importance of intensive lifestyle modification including diet, exercise and weight loss as the first line treatment for diabetes. Ashyah will continue diet, exercise, and weight loss, continue metformin as prescribed, and will continue to monitor renal function. Judy [name] agrees to follow up with our clinic in 2 weeks.  Vitamin D Deficiency Judy [name] was informed that low vitamin D levels contributes to fatigue and are associated with obesity, breast, and colon cancer. Judy [name] agrees to start prescription Vit D @50 ,000 IU every week #4 with no refills. She will follow up for routine testing of vitamin D, at least 2-3 times per year. She was informed of the risk of over-replacement of vitamin D and agrees to not increase her dose unless she discusses this with [name] first. Judy [name] agrees to follow up with our clinic in 2 weeks.  At risk for osteopenia and osteoporosis Judy [name] is at risk for osteopenia and osteoporsis due to her vitamin D deficiency. She was encouraged to take her vitamin D and follow her higher calcium diet and increase strengthening exercise to help strengthen her bones and decrease her risk of osteopenia and osteoporosis.  Obesity Judy [name] is currently in the action stage of change. As such, her  goal is to continue with weight loss efforts She has agreed to follow the Category 2 plan + 100 calories Judy [name] has been instructed to work up to a goal of 150 minutes of combined cardio and strengthening exercise per week for weight loss and overall health benefits. We discussed the following Behavioral Modification Strategies today: increasing lean protein intake, decreasing simple carbohydrates , work on meal planning and easy cooking plans and emotional eating strategies   Judy [name] has agreed to follow up with our clinic in 2 weeks. She was informed of the importance of frequent follow up visits to maximize her success with intensive lifestyle modifications for her multiple health conditions.   OBESITY BEHAVIORAL INTERVENTION VISIT  Today's visit was # 2 out of 22.  Starting weight: 183 lbs Starting date: 01/20/18 Today's weight : 178 lbs  Today's date: 02/04/2018 Total lbs lost to date: 5 (Patients must lose 7 lbs in the first 6 months to continue with counseling)   ASK: We discussed the diagnosis of obesity with Judy [name] today and Judy [name] agreed to give [name] permission to discuss obesity behavioral modification therapy today.  ASSESS: Judy [name] has the diagnosis of obesity and her BMI today is 34.76 Judy [name] is in the action stage of change   ADVISE: Judy [name] was educated on the multiple health risks of obesity as well as the benefit of weight loss to improve her health. She was advised of the need for long term treatment and the importance of lifestyle modifications.  AGREE: Multiple dietary modification options and treatment options were discussed and  Judy [name] agreed to the above obesity treatment plan.  I, Judy [name], am acting as transcriptionist for Dennard Nip, MD  I have reviewed the above documentation for accuracy and completeness, and I agree with the above. -Dennard Nip, MD

## 2018-02-17 ENCOUNTER — Ambulatory Visit (INDEPENDENT_AMBULATORY_CARE_PROVIDER_SITE_OTHER): Payer: Medicare Other | Admitting: Family Medicine

## 2018-02-17 ENCOUNTER — Encounter (INDEPENDENT_AMBULATORY_CARE_PROVIDER_SITE_OTHER): Payer: Self-pay

## 2018-02-17 VITALS — BP 130/75 | HR 74 | Temp 97.4°F | Ht 60.0 in | Wt 177.0 lb

## 2018-02-17 DIAGNOSIS — Z6834 Body mass index (BMI) 34.0-34.9, adult: Secondary | ICD-10-CM

## 2018-02-17 DIAGNOSIS — E119 Type 2 diabetes mellitus without complications: Secondary | ICD-10-CM

## 2018-02-17 DIAGNOSIS — E669 Obesity, unspecified: Secondary | ICD-10-CM | POA: Diagnosis not present

## 2018-02-17 MED ORDER — METFORMIN HCL 500 MG PO TABS: 500.0000 mg | ORAL_TABLET | Freq: Two times a day (BID) | ORAL | 0 refills | Status: DC

## 2018-02-17 NOTE — Progress Notes (Signed)
Office: [phone]  /  Fax: [phone]   HPI:   Chief Complaint: OBESITY Judy [name] is here to discuss her progress with her obesity treatment plan. She is on the Category 2 plan +100 calories and is following her eating plan approximately 95 % of the time. She states she is exercising 0 minutes 0 times per week. Judy [name]  Is doing well with weight loss but she has had some increased celebration eating. She is happy with her plan, but she needs to use her snacks In the afternoons. Her weight is 177 lb (80.3 kg) today and has had a weight loss of 1 pound over a period of 2 weeks since her last visit. She has lost 6 lbs since starting treatment with [name].  Diabetes II Judy [name] has a diagnosis of diabetes type II. Judy [name] states fasting BGs range mostly between 130 and 160, 2 hour post prandial between 130 and 250 and she denies any hypoglycemic episodes. She has been working on intensive lifestyle modifications including diet, exercise, and weight loss to help control her blood glucose levels.  ALLERGIES: Allergies  Allergen Reactions  . No Known Allergies     MEDICATIONS: Current Outpatient Medications on File Prior to Visit  Medication Sig Dispense Refill  . clorazepate (TRANXENE) 3.75 MG tablet Take 3.75 mg by mouth at bedtime.     [name] diltiazem (CARDIZEM CD) 180 MG 24 hr capsule Take 180 mg by mouth daily. Hold for HR <55 or >105    . imipramine (TOFRANIL) 50 MG tablet Take 50 mg by mouth at bedtime.    . NON FORMULARY 1 each by Other route See admin instructions. CPAP machine nightly    . pantoprazole (PROTONIX) 40 MG tablet Take 40 mg by mouth daily.    [name] spironolactone (ALDACTONE) 25 MG tablet Take 25 mg by mouth daily.    . Vitamin D, Ergocalciferol, (DRISDOL) 50000 units CAPS capsule Take 1 capsule (50,000 Units total) by mouth every 7 (seven) days. 4 capsule 0   No current facility-administered medications on file prior to visit.     PAST MEDICAL HISTORY: Past Medical History:    Diagnosis Date  . Anxiety   . Back pain   . Complication of anesthesia 2003   gallbladder surgery-resp. arrest-stop operation and pt. went to ICU  . Depression   . Diabetes ([place])   . Dyspnea   . Gout   . Hypertension   . Leg edema   . Pancreatitis, chronic ([place]) 03/2016  . Sleep apnea     PAST SURGICAL HISTORY: Past Surgical History:  Procedure Laterality Date  . CHOLECYSTECTOMY    . COLONOSCOPY  2013    SOCIAL HISTORY: Social History   Tobacco Use  . Smoking status: Never Smoker  . Smokeless tobacco: Never Used  Substance Use Topics  . Alcohol use: No    Alcohol/week: 0.0 oz  . Drug use: No    FAMILY HISTORY: Family History  Problem Relation Age of Onset  . Dementia Mother   . AAA (abdominal aortic aneurysm) Father   . Breast cancer Maternal Aunt     ROS: Review of Systems  Constitutional: Positive for weight loss.  Endo/Heme/Allergies:       Negative for hypoglycemia    PHYSICAL EXAM: Blood pressure 130/75, pulse 74, temperature (!) 97.4 F (36.3 C), temperature source Oral, height 5' (1.524 m), weight 177 lb (80.3 kg), SpO2 95 %. Body mass index is 34.57 kg/m. Physical Exam  Constitutional: She is oriented to  person, place, and time. She appears well-developed and well-nourished.  Cardiovascular: Normal rate.  Pulmonary/Chest: Effort normal.  Musculoskeletal: Normal range of motion.  Neurological: She is oriented to person, place, and time.  Skin: Skin is warm and dry.  Psychiatric: She has a normal mood and affect. Her behavior is normal.  Vitals reviewed.   RECENT LABS AND TESTS: BMET    Component Value Date/Time   NA 139 01/21/2018 0859   K 4.4 01/21/2018 0859   CL 100 01/21/2018 0859   CO2 23 01/21/2018 0859   GLUCOSE 188 (H) 01/21/2018 0859   GLUCOSE 111 (H) 11/12/2016 1334   BUN 17 01/21/2018 0859   CREATININE 1.28 (H) 01/21/2018 0859   CALCIUM 9.5 01/21/2018 0859   GFRNONAA 41 (L) 01/21/2018 0859   GFRAA 47 (L) 01/21/2018 0859    Lab Results  Component Value Date   HGBA1C 7.3 (H) 01/21/2018   HGBA1C 6.2 (H) 03/22/2016   Lab Results  Component Value Date   INSULIN 44.8 (H) 01/21/2018   CBC    Component Value Date/Time   WBC 8.0 01/21/2018 0859   WBC 10.8 (H) 11/12/2016 1334   RBC 4.77 01/21/2018 0859   RBC 4.36 11/12/2016 1334   HGB 14.7 01/21/2018 0859   HCT 42.6 01/21/2018 0859   PLT 346 12/06/2016   MCV 89 01/21/2018 0859   MCH 30.8 01/21/2018 0859   MCH 30.7 11/12/2016 1334   MCHC 34.5 01/21/2018 0859   MCHC 33.1 11/12/2016 1334   RDW 13.3 01/21/2018 0859   LYMPHSABS 1.8 01/21/2018 0859   MONOABS 0.6 03/16/2016 2347   EOSABS 0.2 01/21/2018 0859   BASOSABS 0.0 01/21/2018 0859   Iron/TIBC/Ferritin/ %Sat No results found for: IRON, TIBC, FERRITIN, IRONPCTSAT Lipid Panel     Component Value Date/Time   CHOL 162 01/21/2018 0859   TRIG 162 (H) 01/21/2018 0859   HDL 40 01/21/2018 0859   LDLCALC 90 01/21/2018 0859   Hepatic Function Panel     Component Value Date/Time   PROT 6.9 01/21/2018 0859   ALBUMIN 4.2 01/21/2018 0859   AST 14 01/21/2018 0859   ALT 25 01/21/2018 0859   ALKPHOS 117 01/21/2018 0859   BILITOT 0.2 01/21/2018 0859      Component Value Date/Time   TSH 1.950 01/21/2018 0859    ASSESSMENT AND PLAN: Type 2 diabetes mellitus without complication, without long-term current use of insulin (HCC)  Class 1 obesity with serious comorbidity and body mass index (BMI) of 34.0 to 34.9 in adult, unspecified obesity type  PLAN:  Diabetes II Judy [name] has been given extensive diabetes education by myself today including ideal fasting and post-prandial blood glucose readings, individual ideal Hgb A1c goals and hypoglycemia prevention. We discussed the importance of good blood sugar control to decrease the likelihood of diabetic complications such as nephropathy, neuropathy, limb loss, blindness, coronary artery disease, and death. We discussed the importance of intensive lifestyle  modification including diet, exercise and weight loss as the first line treatment for diabetes. Judy [name] agrees to increase metformin to 500 mg bid #60 with no refills and follow up at the agreed upon time.  Obesity Judy [name] is currently in the action stage of change. As such, her goal is to continue with weight loss efforts She has agreed to follow the Category 2 plan +100 calories Ila has been instructed to work up to a goal of 150 minutes of combined cardio and strengthening exercise per week for weight loss and overall health benefits. We discussed the  following Behavioral Modification Strategies today: better snacking choices, increasing lean protein intake and decreasing simple carbohydrates   Cerena has agreed to follow up with our clinic in 2 to 3 weeks. She was informed of the importance of frequent follow up visits to maximize her success with intensive lifestyle modifications for her multiple health conditions.   OBESITY BEHAVIORAL INTERVENTION VISIT  Today's visit was # 3 out of 22.  Starting weight: 183 lbs Starting date: 01/20/18 Today's weight : 177 lbs  Today's date: 02/17/2018 Total lbs lost to date: 6 (Patients must lose 7 lbs in the first 6 months to continue with counseling)   ASK: We discussed the diagnosis of obesity with [name] today and Anira agreed to give [name] permission to discuss obesity behavioral modification therapy today.  ASSESS: Cyara has the diagnosis of obesity and her BMI today is 34.57 Shamir is in the action stage of change   ADVISE: Lakin was educated on the multiple health risks of obesity as well as the benefit of weight loss to improve her health. She was advised of the need for long term treatment and the importance of lifestyle modifications.  AGREE: Multiple dietary modification options and treatment options were discussed and  Moana agreed to the above obesity treatment plan.  I, Doreene Nest, am acting as transcriptionist for Dennard Nip, MD  I have reviewed the above documentation for accuracy and completeness, and I agree with the above. -Dennard Nip, MD

## 2018-02-25 ENCOUNTER — Other Ambulatory Visit (INDEPENDENT_AMBULATORY_CARE_PROVIDER_SITE_OTHER): Payer: Self-pay

## 2018-03-10 ENCOUNTER — Ambulatory Visit (INDEPENDENT_AMBULATORY_CARE_PROVIDER_SITE_OTHER): Payer: Medicare Other | Admitting: Family Medicine

## 2018-03-12 ENCOUNTER — Encounter: Payer: Self-pay | Admitting: Rehabilitative and Restorative Service Providers"

## 2018-03-12 NOTE — Therapy (Signed)
[facility] [address] [place], [place], [address] Phone: [phone]   Fax:  [phone]  Patient Details  Name: RETHEL SEBEK MRN: [id number] Date of Birth: [birth year] Referring Provider:  Levin Erp, MD  Encounter Date: last encounter 12/20/17  PHYSICAL THERAPY DISCHARGE SUMMARY  Visits from Start of Care: 11  Current functional level related to goals / functional outcomes: PT Short Term Goals - 11/04/17 1458      PT SHORT TERM GOAL #1   Title  The patient will be independent with HEP for balance, habituation as needed, and general mobility.    Baseline  The patient has not been doing balance activities regularly.  She feels more limited in R hip and shortness of breath during ambulation.  PT focused on stretching for pain reduction today.    Time  4    Period  Weeks    Status  Partially Met      PT SHORT TERM GOAL #2   Title  The patient will improve Berg balance score from 36/56 to > or equal to 42/56 to demo dec'ing fall risk.    Time  4    Period  Weeks    Status  Achieved      PT SHORT TERM GOAL #3   Title  The patient will have negative positional testing for R BPPV.    Time  4    Period  Weeks    Status  Achieved      PT SHORT TERM GOAL #4   Title  The patient will be further assessed for Head impulse testing, gait speed.    Time  4    Period  Weeks    Status  Achieved      PT SHORT TERM GOAL #5   Title  Provide patient education materials for [employer] clinic (Caren [place], MD) as she requests information about weight loss.    Time  4    Period  Weeks    Status  Achieved      PT SHORT TERM GOAL #6   Title  Pt will improve DGI score to >/=13/24 to decr. falls risk.    Status  Achieved      PT Long Term Goals - 12/20/17 1238      PT LONG TERM GOAL #1   Title  The patient will be indep with post d/c exercise and/or community wellness program.    Time  4    Period   Weeks    Status  Revised      PT LONG TERM GOAL #2   Title  The patient will improve FGA from 20/30 up to 23/30 to demonstrate improving functional mobility.    Baseline  22/30 on 12/20/17    Time  4    Period  Weeks    Status  Revised      PT LONG TERM GOAL #3   Title  The patient will demonstrate bending to pick up objects from the floor for improved ADL function in the home.    Baseline  Notes unable to do this at times.    Time  4    Period  Weeks    Status  Revised      PT LONG TERM GOAL #4   Title  The patient will demonstrate being able to reach feet for donning shoes, socks (either bending forward or bringing LE to contralateral knee to donn socks).    Time  4    Period  Weeks    Status  Revised         Remaining deficits: The patient called to cancel remaining visit due to returning to work.  See note from 12/20/17 for patient functional status.   Education / Equipment: Home program.  Plan: Patient agrees to discharge.  Patient goals were partially met. Patient is being discharged due to not returning since the last visit.  ?????      Thank you for the referral of this patient. Rudell Cobb, MPT     Marlaina Coburn 03/12/2018, 9:55 AM  [facility] [address] [place], [place], [address] Phone: [phone]   Fax:  [phone]

## 2018-03-17 ENCOUNTER — Ambulatory Visit (INDEPENDENT_AMBULATORY_CARE_PROVIDER_SITE_OTHER): Payer: Medicare Other | Admitting: Family Medicine

## 2018-03-17 VITALS — BP 105/65 | HR 79 | Temp 98.0°F | Ht 60.0 in | Wt 175.0 lb

## 2018-03-17 DIAGNOSIS — Z6834 Body mass index (BMI) 34.0-34.9, adult: Secondary | ICD-10-CM

## 2018-03-17 DIAGNOSIS — E559 Vitamin D deficiency, unspecified: Secondary | ICD-10-CM | POA: Diagnosis not present

## 2018-03-17 DIAGNOSIS — E669 Obesity, unspecified: Secondary | ICD-10-CM

## 2018-03-17 DIAGNOSIS — E119 Type 2 diabetes mellitus without complications: Secondary | ICD-10-CM | POA: Diagnosis not present

## 2018-03-17 MED ORDER — VITAMIN D (ERGOCALCIFEROL) 1.25 MG (50000 UNIT) PO CAPS: 50000.0000 [IU] | ORAL_CAPSULE | ORAL | 0 refills | Status: DC

## 2018-03-17 NOTE — Progress Notes (Signed)
Office: [phone]  /  Fax: [phone]   HPI:   Chief Complaint: OBESITY [name] is here to discuss her progress with her obesity treatment plan. She is on the Category 2 plan  +100 calories and is following her eating plan approximately 4 % of the time. She states she is exercising 0 minutes 0 times per week. Judy [name] continues to do well with weight loss, but she has made more substitutions. She has decreased protein and increased simple carbohydrates. Her weight is 175 lb (79.4 kg) today and has had a weight loss of 2 pounds over a period of 4 weeks since her last visit. She has lost 8 lbs since starting treatment with [name].  Vitamin D deficiency Judy [name] has a diagnosis of vitamin D deficiency. Judy [name] is stable on vit D and is not yet at goal. She denies nausea, vomiting or muscle weakness.  Diabetes II Judy [name] has a diagnosis of diabetes type II. Judy [name] states fasting BGs range between 115 and 138 and felt hypoglycemic in the 110 to 115 range 2 times in the last 2 weeks, both in the afternoons. She has been working on intensive lifestyle modifications including diet, exercise, and weight loss to help control her blood glucose levels.  ALLERGIES: Allergies  Allergen Reactions  . No Known Allergies     MEDICATIONS: Current Outpatient Medications on File Prior to Visit  Medication Sig Dispense Refill  . clorazepate (TRANXENE) 3.75 MG tablet Take 3.75 mg by mouth at bedtime.     [name] diltiazem (CARDIZEM CD) 180 MG 24 hr capsule Take 180 mg by mouth daily. Hold for HR <55 or >105    . imipramine (TOFRANIL) 50 MG tablet Take 50 mg by mouth at bedtime.    . metFORMIN (GLUCOPHAGE) 500 MG tablet Take 1 tablet (500 mg total) by mouth 2 (two) times daily with a meal. 60 tablet 0  . NON FORMULARY 1 each by Other route See admin instructions. CPAP machine nightly    . pantoprazole (PROTONIX) 40 MG tablet Take 40 mg by mouth daily.    [name] spironolactone (ALDACTONE) 25 MG tablet Take 25 mg by mouth daily.      No current facility-administered medications on file prior to visit.     PAST MEDICAL HISTORY: Past Medical History:  Diagnosis Date  . Anxiety   . Back pain   . Complication of anesthesia 2003   gallbladder surgery-resp. arrest-stop operation and pt. went to ICU  . Depression   . Diabetes ([place])   . Dyspnea   . Gout   . Hypertension   . Leg edema   . Pancreatitis, chronic ([place]) 03/2016  . Sleep apnea     PAST SURGICAL HISTORY: Past Surgical History:  Procedure Laterality Date  . CHOLECYSTECTOMY    . COLONOSCOPY  2013    SOCIAL HISTORY: Social History   Tobacco Use  . Smoking status: Never Smoker  . Smokeless tobacco: Never Used  Substance Use Topics  . Alcohol use: No    Alcohol/week: 0.0 oz  . Drug use: No    FAMILY HISTORY: Family History  Problem Relation Age of Onset  . Dementia Mother   . AAA (abdominal aortic aneurysm) Father   . Breast cancer Maternal Aunt     ROS: Review of Systems  Constitutional: Positive for weight loss.  Gastrointestinal: Negative for nausea and vomiting.  Musculoskeletal:       Negative for muscle weakness  Endo/Heme/Allergies:       Positive for hyopglycemia  PHYSICAL EXAM: Blood pressure 105/65, pulse 79, temperature 98 F (36.7 C), temperature source Oral, height 5' (1.524 m), weight 175 lb (79.4 kg), SpO2 95 %. Body mass index is 34.18 kg/m. Physical Exam  Constitutional: She is oriented to person, place, and time. She appears well-developed and well-nourished.  Cardiovascular: Normal rate.  Pulmonary/Chest: Effort normal.  Musculoskeletal: Normal range of motion.  Neurological: She is oriented to person, place, and time.  Skin: Skin is warm and dry.  Psychiatric: She has a normal mood and affect. Her behavior is normal.  Vitals reviewed.   RECENT LABS AND TESTS: BMET    Component Value Date/Time   NA 139 01/21/2018 0859   K 4.4 01/21/2018 0859   CL 100 01/21/2018 0859   CO2 23 01/21/2018 0859    GLUCOSE 188 (H) 01/21/2018 0859   GLUCOSE 111 (H) 11/12/2016 1334   BUN 17 01/21/2018 0859   CREATININE 1.28 (H) 01/21/2018 0859   CALCIUM 9.5 01/21/2018 0859   GFRNONAA 41 (L) 01/21/2018 0859   GFRAA 47 (L) 01/21/2018 0859   Lab Results  Component Value Date   HGBA1C 7.3 (H) 01/21/2018   HGBA1C 6.2 (H) 03/22/2016   Lab Results  Component Value Date   INSULIN 44.8 (H) 01/21/2018   CBC    Component Value Date/Time   WBC 8.0 01/21/2018 0859   WBC 10.8 (H) 11/12/2016 1334   RBC 4.77 01/21/2018 0859   RBC 4.36 11/12/2016 1334   HGB 14.7 01/21/2018 0859   HCT 42.6 01/21/2018 0859   PLT 346 12/06/2016   MCV 89 01/21/2018 0859   MCH 30.8 01/21/2018 0859   MCH 30.7 11/12/2016 1334   MCHC 34.5 01/21/2018 0859   MCHC 33.1 11/12/2016 1334   RDW 13.3 01/21/2018 0859   LYMPHSABS 1.8 01/21/2018 0859   MONOABS 0.6 03/16/2016 2347   EOSABS 0.2 01/21/2018 0859   BASOSABS 0.0 01/21/2018 0859   Iron/TIBC/Ferritin/ %Sat No results found for: IRON, TIBC, FERRITIN, IRONPCTSAT Lipid Panel     Component Value Date/Time   CHOL 162 01/21/2018 0859   TRIG 162 (H) 01/21/2018 0859   HDL 40 01/21/2018 0859   LDLCALC 90 01/21/2018 0859   Hepatic Function Panel     Component Value Date/Time   PROT 6.9 01/21/2018 0859   ALBUMIN 4.2 01/21/2018 0859   AST 14 01/21/2018 0859   ALT 25 01/21/2018 0859   ALKPHOS 117 01/21/2018 0859   BILITOT 0.2 01/21/2018 0859      Component Value Date/Time   TSH 1.950 01/21/2018 0859   Results for EMBRY, MANRIQUE (MRN [id number]) as of 03/17/2018 17:45  Ref. Range 01/21/2018 08:59  Vitamin D, 25-Hydroxy Latest Ref Range: 30.0 - 100.0 ng/mL 6.7 (L)   ASSESSMENT AND PLAN: Type 2 diabetes mellitus without complication, without long-term current use of insulin (HCC)  Vitamin D deficiency - Plan: Vitamin D, Ergocalciferol, (DRISDOL) 50000 units CAPS capsule  Class 1 obesity with serious comorbidity and body mass index (BMI) of 34.0 to 34.9 in adult,  unspecified obesity type  PLAN:  Vitamin D Deficiency Judy [name] was informed that low vitamin D levels contributes to fatigue and are associated with obesity, breast, and colon cancer. She agrees to continue to take prescription Vit D @50 ,000 IU every week #4 with no refills and will follow up for routine testing of vitamin D, at least 2-3 times per year. She was informed of the risk of over-replacement of vitamin D and agrees to not increase her dose unless she discusses  this with [name] first. Thamara agrees to follow up with our clinic in 2 to 3 weeks.  Diabetes II Litsy has been given extensive diabetes education by myself today including ideal fasting and post-prandial blood glucose readings, individual ideal Hgb A1c goals and hypoglycemia prevention. We discussed the importance of good blood sugar control to decrease the likelihood of diabetic complications such as nephropathy, neuropathy, limb loss, blindness, coronary artery disease, and death. We discussed the importance of intensive lifestyle modification including diet, exercise and weight loss as the first line treatment for diabetes. Kristianne agrees to continue with diet and exercise and decreasing her simple carbohydrates. We will recheck labs in 6 weeks and she will follow up at the agreed upon time.  Obesity Leeasia is currently in the action stage of change. As such, her goal is to continue with weight loss efforts She has agreed to get back to strict Category 2 plan Brier has been instructed to work up to a goal of 150 minutes of combined cardio and strengthening exercise per week or 2 times a week for 30 to 60 minutes water walking and strength for weight loss and overall health benefits. We discussed the following Behavioral Modification Strategies today: no skipping meals, increasing lean protein intake and decreasing simple carbohydrates   Ziyanna has agreed to follow up with our clinic in 2 to 3 weeks. She was informed of the importance of  frequent follow up visits to maximize her success with intensive lifestyle modifications for her multiple health conditions.   OBESITY BEHAVIORAL INTERVENTION VISIT  Today's visit was # 4 out of 22.  Starting weight: 183 lbs Starting date: 01/20/18 Today's weight : 175 lbs Today's date: 03/17/2018 Total lbs lost to date: 8 (Patients must lose 7 lbs in the first 6 months to continue with counseling)   ASK: We discussed the diagnosis of obesity with [name] today and Leonie agreed to give [name] permission to discuss obesity behavioral modification therapy today.  ASSESS: Sarahlynn has the diagnosis of obesity and her BMI today is 34.18 Yanel is in the action stage of change   ADVISE: Annelisa was educated on the multiple health risks of obesity as well as the benefit of weight loss to improve her health. She was advised of the need for long term treatment and the importance of lifestyle modifications.  AGREE: Multiple dietary modification options and treatment options were discussed and  Martita agreed to the above obesity treatment plan.  I, Doreene Nest, am acting as transcriptionist for Dennard Nip, MD  I have reviewed the above documentation for accuracy and completeness, and I agree with the above. -Dennard Nip, MD

## 2018-04-07 ENCOUNTER — Ambulatory Visit (INDEPENDENT_AMBULATORY_CARE_PROVIDER_SITE_OTHER): Payer: Medicare Other | Admitting: Family Medicine

## 2018-04-14 ENCOUNTER — Ambulatory Visit (INDEPENDENT_AMBULATORY_CARE_PROVIDER_SITE_OTHER): Payer: Medicare Other | Admitting: Family Medicine

## 2018-04-14 VITALS — BP 115/71 | HR 87 | Temp 98.0°F | Ht 60.0 in | Wt 174.0 lb

## 2018-04-14 DIAGNOSIS — E119 Type 2 diabetes mellitus without complications: Secondary | ICD-10-CM

## 2018-04-14 DIAGNOSIS — Z6834 Body mass index (BMI) 34.0-34.9, adult: Secondary | ICD-10-CM | POA: Diagnosis not present

## 2018-04-14 DIAGNOSIS — E559 Vitamin D deficiency, unspecified: Secondary | ICD-10-CM | POA: Diagnosis not present

## 2018-04-14 DIAGNOSIS — E669 Obesity, unspecified: Secondary | ICD-10-CM

## 2018-04-14 MED ORDER — METFORMIN HCL 500 MG PO TABS: 500.0000 mg | ORAL_TABLET | Freq: Three times a day (TID) | ORAL | 0 refills | Status: DC

## 2018-04-14 MED ORDER — GLUCOSE BLOOD VI STRP: ORAL_STRIP | 0 refills | Status: DC

## 2018-04-14 MED ORDER — ACCU-CHEK SAFE-T PRO LANCETS MISC: 0 refills | Status: DC

## 2018-04-14 MED ORDER — VITAMIN D (ERGOCALCIFEROL) 1.25 MG (50000 UNIT) PO CAPS: 50000.0000 [IU] | ORAL_CAPSULE | ORAL | 0 refills | Status: DC

## 2018-04-15 ENCOUNTER — Other Ambulatory Visit (INDEPENDENT_AMBULATORY_CARE_PROVIDER_SITE_OTHER): Payer: Self-pay | Admitting: Family Medicine

## 2018-04-15 DIAGNOSIS — E559 Vitamin D deficiency, unspecified: Secondary | ICD-10-CM

## 2018-04-15 NOTE — Progress Notes (Signed)
Office: [phone]  /  Fax: [phone]   HPI:   Chief Complaint: OBESITY Judy [name] is here to discuss her progress with her obesity treatment plan. She is on the  follow the Category 2 plan and is following her eating plan approximately 90 % of the time. She states she is exercising 0 minutes 0 times per week. Judy [name] continues to lose weight slowly, even with some increased celebration eating over Mother's Day weekend. Her weight is 174 lb (78.9 kg) today and has had a weight loss of 1 pound over a period of 4 weeks since her last visit. She has lost 9 lbs since starting treatment with [name].  Vitamin D deficiency Judy [name] has a diagnosis of vitamin D deficiency. Judy [name] is stable on vit D but she is not yet at goal. Judy [name] denies nausea, vomiting or muscle weakness.  Diabetes II Judy [name] has a diagnosis of diabetes type II. Judy [name] states fasting BGs range between 120 and 160's, 2 hour post prandial BGs range between 120 and 250 on metformin. Judy [name] denies any hypoglycemic episodes, nausea or vomiting. Last A1c was at 7.3 She has been working on intensive lifestyle modifications including diet, exercise, and weight loss to help control her blood glucose levels.  ALLERGIES: Allergies  Allergen Reactions  . No Known Allergies     MEDICATIONS: Current Outpatient Medications on File Prior to Visit  Medication Sig Dispense Refill  . clorazepate (TRANXENE) 3.75 MG tablet Take 3.75 mg by mouth at bedtime.     [name] diltiazem (CARDIZEM CD) 180 MG 24 hr capsule Take 180 mg by mouth daily. Hold for HR <55 or >105    . imipramine (TOFRANIL) 50 MG tablet Take 50 mg by mouth at bedtime.    . NON FORMULARY 1 each by Other route See admin instructions. CPAP machine nightly    . pantoprazole (PROTONIX) 40 MG tablet Take 40 mg by mouth daily.    [name] spironolactone (ALDACTONE) 25 MG tablet Take 25 mg by mouth daily.     No current facility-administered medications on file prior to visit.     PAST MEDICAL  HISTORY: Past Medical History:  Diagnosis Date  . Anxiety   . Back pain   . Complication of anesthesia 2003   gallbladder surgery-resp. arrest-stop operation and pt. went to ICU  . Depression   . Diabetes ([place])   . Dyspnea   . Gout   . Hypertension   . Leg edema   . Pancreatitis, chronic ([place]) 03/2016  . Sleep apnea     PAST SURGICAL HISTORY: Past Surgical History:  Procedure Laterality Date  . CHOLECYSTECTOMY    . COLONOSCOPY  2013    SOCIAL HISTORY: Social History   Tobacco Use  . Smoking status: Never Smoker  . Smokeless tobacco: Never Used  Substance Use Topics  . Alcohol use: No    Alcohol/week: 0.0 oz  . Drug use: No    FAMILY HISTORY: Family History  Problem Relation Age of Onset  . Dementia Mother   . AAA (abdominal aortic aneurysm) Father   . Breast cancer Maternal Aunt     ROS: Review of Systems  Constitutional: Positive for weight loss.  Gastrointestinal: Negative for nausea and vomiting.  Musculoskeletal:       Negative for muscle weakness  Endo/Heme/Allergies:       Negative for hypoglycemia Positive for hyperglycemia    PHYSICAL EXAM: Blood pressure 115/71, pulse 87, temperature 98 F (36.7 C), temperature source Oral, height 5' (1.524 m),  weight 174 lb (78.9 kg), SpO2 95 %. Body mass index is 33.98 kg/m. Physical Exam  Constitutional: She is oriented to person, place, and time. She appears well-developed and well-nourished.  Cardiovascular: Normal rate.  Pulmonary/Chest: Effort normal.  Musculoskeletal: Normal range of motion.  Neurological: She is oriented to person, place, and time.  Skin: Skin is warm and dry.  Psychiatric: She has a normal mood and affect. Her behavior is normal.  Vitals reviewed.   RECENT LABS AND TESTS: BMET    Component Value Date/Time   NA 139 01/21/2018 0859   K 4.4 01/21/2018 0859   CL 100 01/21/2018 0859   CO2 23 01/21/2018 0859   GLUCOSE 188 (H) 01/21/2018 0859   GLUCOSE 111 (H) 11/12/2016 1334    BUN 17 01/21/2018 0859   CREATININE 1.28 (H) 01/21/2018 0859   CALCIUM 9.5 01/21/2018 0859   GFRNONAA 41 (L) 01/21/2018 0859   GFRAA 47 (L) 01/21/2018 0859   Lab Results  Component Value Date   HGBA1C 7.3 (H) 01/21/2018   HGBA1C 6.2 (H) 03/22/2016   Lab Results  Component Value Date   INSULIN 44.8 (H) 01/21/2018   CBC    Component Value Date/Time   WBC 8.0 01/21/2018 0859   WBC 10.8 (H) 11/12/2016 1334   RBC 4.77 01/21/2018 0859   RBC 4.36 11/12/2016 1334   HGB 14.7 01/21/2018 0859   HCT 42.6 01/21/2018 0859   PLT 346 12/06/2016   MCV 89 01/21/2018 0859   MCH 30.8 01/21/2018 0859   MCH 30.7 11/12/2016 1334   MCHC 34.5 01/21/2018 0859   MCHC 33.1 11/12/2016 1334   RDW 13.3 01/21/2018 0859   LYMPHSABS 1.8 01/21/2018 0859   MONOABS 0.6 03/16/2016 2347   EOSABS 0.2 01/21/2018 0859   BASOSABS 0.0 01/21/2018 0859   Iron/TIBC/Ferritin/ %Sat No results found for: IRON, TIBC, FERRITIN, IRONPCTSAT Lipid Panel     Component Value Date/Time   CHOL 162 01/21/2018 0859   TRIG 162 (H) 01/21/2018 0859   HDL 40 01/21/2018 0859   LDLCALC 90 01/21/2018 0859   Hepatic Function Panel     Component Value Date/Time   PROT 6.9 01/21/2018 0859   ALBUMIN 4.2 01/21/2018 0859   AST 14 01/21/2018 0859   ALT 25 01/21/2018 0859   ALKPHOS 117 01/21/2018 0859   BILITOT 0.2 01/21/2018 0859      Component Value Date/Time   TSH 1.950 01/21/2018 0859   Results for SINCLAIR, ARRAZOLA (MRN [id number]) as of 04/15/2018 15:21  Ref. Range 01/21/2018 08:59  Vitamin D, 25-Hydroxy Latest Ref Range: 30.0 - 100.0 ng/mL 6.7 (L)   ASSESSMENT AND PLAN: Type 2 diabetes mellitus without complication, without long-term current use of insulin (HCC) - Plan: metFORMIN (GLUCOPHAGE) 500 MG tablet, Lancets (ACCU-CHEK SAFE-T PRO) lancets, glucose blood (ACCU-CHEK AVIVA) test strip  Vitamin D deficiency - Plan: Vitamin D, Ergocalciferol, (DRISDOL) 50000 units CAPS capsule  Class 1 obesity with serious  comorbidity and body mass index (BMI) of 34.0 to 34.9 in adult, unspecified obesity type  PLAN:  Vitamin D Deficiency Judy [name] was informed that low vitamin D levels contributes to fatigue and are associated with obesity, breast, and colon cancer. She agrees to continue to take prescription Vit D @50 ,000 IU every week #4 with no refills and will follow up for routine testing of vitamin D, at least 2-3 times per year. She was informed of the risk of over-replacement of vitamin D and agrees to not increase her dose unless she discusses this  with [name] first. Judy [name] agrees to follow up with our clinic in 2 to 3 weeks.  Diabetes II Judy [name] has been given extensive diabetes education by myself today including ideal fasting and post-prandial blood glucose readings, individual ideal Hgb A1c goals and hypoglycemia prevention. We discussed the importance of good blood sugar control to decrease the likelihood of diabetic complications such as nephropathy, neuropathy, limb loss, blindness, coronary artery disease, and death. We discussed the importance of intensive lifestyle modification including diet, exercise and weight loss as the first line treatment for diabetes. Judy [name] agrees to change metformin 500 mg to three times daily #90 with no refills and we will refill strips and lancets. Judy [name] agrees to follow up at the agreed upon time.  Obesity Judy [name] is currently in the action stage of change. As such, her goal is to continue with weight loss efforts She has agreed to follow the Category 2 plan Judy [name] has been instructed to work up to a goal of 150 minutes of combined cardio and strengthening exercise per week for weight loss and overall health benefits. We discussed the following Behavioral Modification Strategies today: no skipping meals, increasing lean protein intake and decreasing simple carbohydrates   Judy [name] has agreed to follow up with our clinic in 2 to 3 weeks. She was informed of the importance of frequent  follow up visits to maximize her success with intensive lifestyle modifications for her multiple health conditions.   OBESITY BEHAVIORAL INTERVENTION VISIT  Today's visit was # 5 out of 22.  Starting weight: 183 lbs Starting date: 01/20/18 Today's weight : 174 lbs Today's date: 04/14/2018 Total lbs lost to date: 9 (Patients must lose 7 lbs in the first 6 months to continue with counseling)   ASK: We discussed the diagnosis of obesity with Judy [name] today and Judy [name] agreed to give [name] permission to discuss obesity behavioral modification therapy today.  ASSESS: Judy [name] has the diagnosis of obesity and her BMI today is 33.98 Judy [name] is in the action stage of change   ADVISE: Judy [name] was educated on the multiple health risks of obesity as well as the benefit of weight loss to improve her health. She was advised of the need for long term treatment and the importance of lifestyle modifications.  AGREE: Multiple dietary modification options and treatment options were discussed and  Judy [name] agreed to the above obesity treatment plan.  I, Doreene Nest, am acting as transcriptionist for Dennard Nip, MD  I have reviewed the above documentation for accuracy and completeness, and I agree with the above. -Dennard Nip, MD

## 2018-04-17 ENCOUNTER — Other Ambulatory Visit (INDEPENDENT_AMBULATORY_CARE_PROVIDER_SITE_OTHER): Payer: Self-pay

## 2018-04-17 ENCOUNTER — Telehealth (INDEPENDENT_AMBULATORY_CARE_PROVIDER_SITE_OTHER): Payer: Self-pay | Admitting: Family Medicine

## 2018-04-17 NOTE — Telephone Encounter (Signed)
Blood strips that were order not right size.  Need 1 touch ultra 2.   Pharmacy [facility] [address]

## 2018-04-17 NOTE — Telephone Encounter (Signed)
Spoke with the patient and informed her we called in the meter that will match the strips she currently has as her insurance company would not let us call in any additional strips at this time. Patient verbalized understanding and agreed. April, [place]

## 2018-05-06 ENCOUNTER — Ambulatory Visit (INDEPENDENT_AMBULATORY_CARE_PROVIDER_SITE_OTHER): Payer: Medicare Other | Admitting: Family Medicine

## 2018-05-06 VITALS — BP 115/73 | HR 79 | Temp 98.1°F | Ht 60.0 in | Wt 171.0 lb

## 2018-05-06 DIAGNOSIS — E669 Obesity, unspecified: Secondary | ICD-10-CM

## 2018-05-06 DIAGNOSIS — E119 Type 2 diabetes mellitus without complications: Secondary | ICD-10-CM

## 2018-05-06 DIAGNOSIS — Z6833 Body mass index (BMI) 33.0-33.9, adult: Secondary | ICD-10-CM

## 2018-05-06 NOTE — Progress Notes (Signed)
Office: [phone]  /  Fax: [phone]   HPI:   Chief Complaint: OBESITY Judy [name] is here to discuss her progress with her obesity treatment plan. She is on the Category 2 plan and is following her eating plan approximately 90 % of the time. She states she is exercising 0 minutes 0 times per week. Judy [name] continues to do well with weight loss on Category 2 plan. Hunger is controlled and she is doing well eating all her food. She is meal prepping much better.  Her weight is 171 lb (77.6 kg) today and has had a weight loss of 3 pounds over a period of 3 weeks since her last visit. She has lost 12 lbs since starting treatment with [name].  Diabetes II Judy [name] has a diagnosis of diabetes type II. Judy [name] states fasting BGs mostly between 110 and 130's and 2 hour post prandial range between 110 and 164 with diet and metformin. She denies nausea, vomiting, or hypoglycemia. Last A1c was 7.3 on 01/21/18. She has been working on intensive lifestyle modifications including diet, exercise, and weight loss to help control her blood glucose levels.  ALLERGIES: Allergies  Allergen Reactions  . No Known Allergies     MEDICATIONS: Current Outpatient Medications on File Prior to Visit  Medication Sig Dispense Refill  . clorazepate (TRANXENE) 3.75 MG tablet Take 3.75 mg by mouth at bedtime.     [name] diltiazem (CARDIZEM CD) 180 MG 24 hr capsule Take 180 mg by mouth daily. Hold for HR <55 or >105    . glucose blood (ACCU-CHEK AVIVA) test strip Test twice daily 100 each 0  . imipramine (TOFRANIL) 50 MG tablet Take 50 mg by mouth at bedtime.    . Lancets (ACCU-CHEK SAFE-T PRO) lancets Test twice daily 100 each 0  . metFORMIN (GLUCOPHAGE) 500 MG tablet Take 1 tablet (500 mg total) by mouth 3 (three) times daily. 90 tablet 0  . NON FORMULARY 1 each by Other route See admin instructions. CPAP machine nightly    . pantoprazole (PROTONIX) 40 MG tablet Take 40 mg by mouth daily.    [name] spironolactone (ALDACTONE) 25 MG tablet  Take 25 mg by mouth daily.    . Vitamin D, Ergocalciferol, (DRISDOL) 50000 units CAPS capsule Take 1 capsule (50,000 Units total) by mouth every 7 (seven) days. 4 capsule 0   No current facility-administered medications on file prior to visit.     PAST MEDICAL HISTORY: Past Medical History:  Diagnosis Date  . Anxiety   . Back pain   . Complication of anesthesia 2003   gallbladder surgery-resp. arrest-stop operation and pt. went to ICU  . Depression   . Diabetes ([place])   . Dyspnea   . Gout   . Hypertension   . Leg edema   . Pancreatitis, chronic ([place]) 03/2016  . Sleep apnea     PAST SURGICAL HISTORY: Past Surgical History:  Procedure Laterality Date  . CHOLECYSTECTOMY    . COLONOSCOPY  2013    SOCIAL HISTORY: Social History   Tobacco Use  . Smoking status: Never Smoker  . Smokeless tobacco: Never Used  Substance Use Topics  . Alcohol use: No    Alcohol/week: 0.0 oz  . Drug use: No    FAMILY HISTORY: Family History  Problem Relation Age of Onset  . Dementia Mother   . AAA (abdominal aortic aneurysm) Father   . Breast cancer Maternal Aunt     ROS: Review of Systems  Constitutional: Positive for weight loss.  Gastrointestinal: Negative for nausea and vomiting.  Endo/Heme/Allergies:       Negative hypoglycemia    PHYSICAL EXAM: Blood pressure 115/73, pulse 79, temperature 98.1 F (36.7 C), temperature source Oral, height 5' (1.524 m), weight 171 lb (77.6 kg), SpO2 95 %. Body mass index is 33.4 kg/m. Physical Exam  Constitutional: She is oriented to person, place, and time. She appears well-developed and well-nourished.  Cardiovascular: Normal rate.  Pulmonary/Chest: Effort normal.  Musculoskeletal: Normal range of motion.  Neurological: She is oriented to person, place, and time.  Skin: Skin is warm and dry.  Psychiatric: She has a normal mood and affect. Her behavior is normal.  Vitals reviewed.   RECENT LABS AND TESTS: BMET    Component Value  Date/Time   NA 139 01/21/2018 0859   K 4.4 01/21/2018 0859   CL 100 01/21/2018 0859   CO2 23 01/21/2018 0859   GLUCOSE 188 (H) 01/21/2018 0859   GLUCOSE 111 (H) 11/12/2016 1334   BUN 17 01/21/2018 0859   CREATININE 1.28 (H) 01/21/2018 0859   CALCIUM 9.5 01/21/2018 0859   GFRNONAA 41 (L) 01/21/2018 0859   GFRAA 47 (L) 01/21/2018 0859   Lab Results  Component Value Date   HGBA1C 7.3 (H) 01/21/2018   HGBA1C 6.2 (H) 03/22/2016   Lab Results  Component Value Date   INSULIN 44.8 (H) 01/21/2018   CBC    Component Value Date/Time   WBC 8.0 01/21/2018 0859   WBC 10.8 (H) 11/12/2016 1334   RBC 4.77 01/21/2018 0859   RBC 4.36 11/12/2016 1334   HGB 14.7 01/21/2018 0859   HCT 42.6 01/21/2018 0859   PLT 346 12/06/2016   MCV 89 01/21/2018 0859   MCH 30.8 01/21/2018 0859   MCH 30.7 11/12/2016 1334   MCHC 34.5 01/21/2018 0859   MCHC 33.1 11/12/2016 1334   RDW 13.3 01/21/2018 0859   LYMPHSABS 1.8 01/21/2018 0859   MONOABS 0.6 03/16/2016 2347   EOSABS 0.2 01/21/2018 0859   BASOSABS 0.0 01/21/2018 0859   Iron/TIBC/Ferritin/ %Sat No results found for: IRON, TIBC, FERRITIN, IRONPCTSAT Lipid Panel     Component Value Date/Time   CHOL 162 01/21/2018 0859   TRIG 162 (H) 01/21/2018 0859   HDL 40 01/21/2018 0859   LDLCALC 90 01/21/2018 0859   Hepatic Function Panel     Component Value Date/Time   PROT 6.9 01/21/2018 0859   ALBUMIN 4.2 01/21/2018 0859   AST 14 01/21/2018 0859   ALT 25 01/21/2018 0859   ALKPHOS 117 01/21/2018 0859   BILITOT 0.2 01/21/2018 0859      Component Value Date/Time   TSH 1.950 01/21/2018 0859    ASSESSMENT AND PLAN: Type 2 diabetes mellitus without complication, without long-term current use of insulin (HCC)  Class 1 obesity with serious comorbidity and body mass index (BMI) of 33.0 to 33.9 in adult, unspecified obesity type  PLAN:  Diabetes II Judy [name] has been given extensive diabetes education by myself today including ideal fasting and  post-prandial blood glucose readings, individual ideal Hgb A1c goals and hypoglycemia prevention. We discussed the importance of good blood sugar control to decrease the likelihood of diabetic complications such as nephropathy, neuropathy, limb loss, blindness, coronary artery disease, and death. We discussed the importance of intensive lifestyle modification including diet, exercise and weight loss as the first line treatment for diabetes. Judy [name] agrees to continue her diabetes medications and diet, and we will recheck labs in 1 month. Judy [name] agrees to follow up with our clinic  in 2 to 3 weeks.  We spent > than 50% of the 15 minute visit on the counseling as documented in the note.  Obesity Judy [name] is currently in the action stage of change. As such, her goal is to continue with weight loss efforts She has agreed to follow the Category 2 plan Judy [name] has been instructed to work up to a goal of 150 minutes of combined cardio and strengthening exercise per week or start water walking for 30 minutes 2 times per week and shoulder stretches for weight loss and overall health benefits. We discussed the following Behavioral Modification Strategies today: increasing lean protein intake and decreasing simple carbohydrates    Judy [name] has agreed to follow up with our clinic in 2 to 3 weeks. She was informed of the importance of frequent follow up visits to maximize her success with intensive lifestyle modifications for her multiple health conditions.   OBESITY BEHAVIORAL INTERVENTION VISIT  Today's visit was # 6 out of 22.  Starting weight: 183 lbs Starting date: 01/20/18 Today's weight : 171 lbs  Today's date: 05/06/2018 Total lbs lost to date: 12 (Patients must lose 7 lbs in the first 6 months to continue with counseling)   ASK: We discussed the diagnosis of obesity with Judy [name] today and Judy [name] agreed to give [name] permission to discuss obesity behavioral modification therapy today.  ASSESS: Judy [name]  has the diagnosis of obesity and her BMI today is 33.4 Judy [name] is in the action stage of change   ADVISE: Judy [name] was educated on the multiple health risks of obesity as well as the benefit of weight loss to improve her health. She was advised of the need for long term treatment and the importance of lifestyle modifications.  AGREE: Multiple dietary modification options and treatment options were discussed and  Judy [name] agreed to the above obesity treatment plan.  I, Trixie Dredge, am acting as transcriptionist for Dennard Nip, MD  I have reviewed the above documentation for accuracy and completeness, and I agree with the above. -Dennard Nip, MD

## 2018-05-27 ENCOUNTER — Ambulatory Visit (INDEPENDENT_AMBULATORY_CARE_PROVIDER_SITE_OTHER): Payer: Medicare Other | Admitting: Family Medicine

## 2018-05-27 VITALS — BP 118/71 | HR 93 | Temp 98.0°F | Ht 60.0 in | Wt 169.0 lb

## 2018-05-27 DIAGNOSIS — E119 Type 2 diabetes mellitus without complications: Secondary | ICD-10-CM

## 2018-05-27 DIAGNOSIS — E669 Obesity, unspecified: Secondary | ICD-10-CM

## 2018-05-27 DIAGNOSIS — Z6833 Body mass index (BMI) 33.0-33.9, adult: Secondary | ICD-10-CM | POA: Diagnosis not present

## 2018-05-27 MED ORDER — GLUCOSE BLOOD VI STRP: ORAL_STRIP | 0 refills | Status: DC

## 2018-05-27 NOTE — Progress Notes (Signed)
Office: [phone]  /  Fax: [phone]   HPI:   Chief Complaint: OBESITY Judy [name] is here to discuss her progress with her obesity treatment plan. She is on the Category 2 plan and is following her eating plan approximately 40 % of the time. She states she is exercising 0 minutes 0 times per week. Judy [name] is not following her diet prescription closely, mostly portion control and smarter food choices. Still eating out and decreasing lean protein.  Her weight is 169 lb (76.7 kg) today and has had a weight loss of 2 pounds over a period of 3 weeks since her last visit. She has lost 14 lbs since starting treatment with [name].  Diabetes II Judy [name] has a diagnosis of diabetes type II. Judy [name] states fasting BGs range between 119 and 157, not on medications and working on diet controlling. She denies any hypoglycemic episodes. Last A1c was 7.3.  ALLERGIES: Allergies  Allergen Reactions  . No Known Allergies     MEDICATIONS: Current Outpatient Medications on File Prior to Visit  Medication Sig Dispense Refill  . clorazepate (TRANXENE) 3.75 MG tablet Take 3.75 mg by mouth at bedtime.     [name] diltiazem (CARDIZEM CD) 180 MG 24 hr capsule Take 180 mg by mouth daily. Hold for HR <55 or >105    . imipramine (TOFRANIL) 50 MG tablet Take 50 mg by mouth at bedtime.    . Lancets (ACCU-CHEK SAFE-T PRO) lancets Test twice daily 100 each 0  . metFORMIN (GLUCOPHAGE) 500 MG tablet Take 1 tablet (500 mg total) by mouth 3 (three) times daily. 90 tablet 0  . NON FORMULARY 1 each by Other route See admin instructions. CPAP machine nightly    . pantoprazole (PROTONIX) 40 MG tablet Take 40 mg by mouth daily.    [name] spironolactone (ALDACTONE) 25 MG tablet Take 25 mg by mouth daily.    . Vitamin D, Ergocalciferol, (DRISDOL) 50000 units CAPS capsule Take 1 capsule (50,000 Units total) by mouth every 7 (seven) days. 4 capsule 0   No current facility-administered medications on file prior to visit.     PAST MEDICAL  HISTORY: Past Medical History:  Diagnosis Date  . Anxiety   . Back pain   . Complication of anesthesia 2003   gallbladder surgery-resp. arrest-stop operation and pt. went to ICU  . Depression   . Diabetes ([place])   . Dyspnea   . Gout   . Hypertension   . Leg edema   . Pancreatitis, chronic ([place]) 03/2016  . Sleep apnea     PAST SURGICAL HISTORY: Past Surgical History:  Procedure Laterality Date  . CHOLECYSTECTOMY    . COLONOSCOPY  2013    SOCIAL HISTORY: Social History   Tobacco Use  . Smoking status: Never Smoker  . Smokeless tobacco: Never Used  Substance Use Topics  . Alcohol use: No    Alcohol/week: 0.0 oz  . Drug use: No    FAMILY HISTORY: Family History  Problem Relation Age of Onset  . Dementia Mother   . AAA (abdominal aortic aneurysm) Father   . Breast cancer Maternal Aunt     ROS: Review of Systems  Constitutional: Positive for weight loss.  Endo/Heme/Allergies:       Negative hypoglycemia    PHYSICAL EXAM: Blood pressure 118/71, pulse 93, temperature 98 F (36.7 C), temperature source Oral, height 5' (1.524 m), weight 169 lb (76.7 kg), SpO2 95 %. Body mass index is 33.01 kg/m. Physical Exam  Constitutional: She is  oriented to person, place, and time. She appears well-developed and well-nourished.  Cardiovascular: Normal rate.  Pulmonary/Chest: Effort normal.  Musculoskeletal: Normal range of motion.  Neurological: She is oriented to person, place, and time.  Skin: Skin is warm and dry.  Psychiatric: She has a normal mood and affect. Her behavior is normal.  Vitals reviewed.   RECENT LABS AND TESTS: BMET    Component Value Date/Time   NA 139 01/21/2018 0859   K 4.4 01/21/2018 0859   CL 100 01/21/2018 0859   CO2 23 01/21/2018 0859   GLUCOSE 188 (H) 01/21/2018 0859   GLUCOSE 111 (H) 11/12/2016 1334   BUN 17 01/21/2018 0859   CREATININE 1.28 (H) 01/21/2018 0859   CALCIUM 9.5 01/21/2018 0859   GFRNONAA 41 (L) 01/21/2018 0859   GFRAA  47 (L) 01/21/2018 0859   Lab Results  Component Value Date   HGBA1C 7.3 (H) 01/21/2018   HGBA1C 6.2 (H) 03/22/2016   Lab Results  Component Value Date   INSULIN 44.8 (H) 01/21/2018   CBC    Component Value Date/Time   WBC 8.0 01/21/2018 0859   WBC 10.8 (H) 11/12/2016 1334   RBC 4.77 01/21/2018 0859   RBC 4.36 11/12/2016 1334   HGB 14.7 01/21/2018 0859   HCT 42.6 01/21/2018 0859   PLT 346 12/06/2016   MCV 89 01/21/2018 0859   MCH 30.8 01/21/2018 0859   MCH 30.7 11/12/2016 1334   MCHC 34.5 01/21/2018 0859   MCHC 33.1 11/12/2016 1334   RDW 13.3 01/21/2018 0859   LYMPHSABS 1.8 01/21/2018 0859   MONOABS 0.6 03/16/2016 2347   EOSABS 0.2 01/21/2018 0859   BASOSABS 0.0 01/21/2018 0859   Iron/TIBC/Ferritin/ %Sat No results found for: IRON, TIBC, FERRITIN, IRONPCTSAT Lipid Panel     Component Value Date/Time   CHOL 162 01/21/2018 0859   TRIG 162 (H) 01/21/2018 0859   HDL 40 01/21/2018 0859   LDLCALC 90 01/21/2018 0859   Hepatic Function Panel     Component Value Date/Time   PROT 6.9 01/21/2018 0859   ALBUMIN 4.2 01/21/2018 0859   AST 14 01/21/2018 0859   ALT 25 01/21/2018 0859   ALKPHOS 117 01/21/2018 0859   BILITOT 0.2 01/21/2018 0859      Component Value Date/Time   TSH 1.950 01/21/2018 0859    ASSESSMENT AND PLAN: Type 2 diabetes mellitus without complication, without long-term current use of insulin ([place]) - Plan: glucose blood (ACCU-CHEK AVIVA) test strip  Class 1 obesity with serious comorbidity and body mass index (BMI) of 33.0 to 33.9 in adult, unspecified obesity type  PLAN:  Diabetes II Judy [name] has been given extensive diabetes education by myself today including ideal fasting and post-prandial blood glucose readings, individual ideal Hgb A1c goals and hypoglycemia prevention. We discussed the importance of good blood sugar control to decrease the likelihood of diabetic complications such as nephropathy, neuropathy, limb loss, blindness, coronary artery  disease, and death. We discussed the importance of intensive lifestyle modification including diet, exercise and weight loss as the first line treatment for diabetes. We will refill glucose test strips #100 with no refills and she will continue diet and exercise. Jamice agrees to follow up with our clinic in 2 to 3 weeks.  Obesity Vaniah is currently in the action stage of change. As such, her goal is to continue with weight loss efforts She has agreed to follow the Category 2 plan Stefhanie has been instructed to work up to a goal of 150 minutes of  combined cardio and strengthening exercise per week for weight loss and overall health benefits. We discussed the following Behavioral Modification Strategies today: increasing lean protein intake, decreasing simple carbohydrates  and decrease eating out   Marigold has agreed to follow up with our clinic in 2 to 3 weeks. She was informed of the importance of frequent follow up visits to maximize her success with intensive lifestyle modifications for her multiple health conditions.   OBESITY BEHAVIORAL INTERVENTION VISIT  Today's visit was # 7 out of 22.  Starting weight: 183 lbs Starting date: 01/20/18 Today's weight : 169 lbs Today's date: 05/27/2018 Total lbs lost to date: 14 (Patients must lose 7 lbs in the first 6 months to continue with counseling)   ASK: We discussed the diagnosis of obesity with [name] today and Nzinga agreed to give [name] permission to discuss obesity behavioral modification therapy today.  ASSESS: Ahtziry has the diagnosis of obesity and her BMI today is 33.01 Tamsen is in the action stage of change   ADVISE: Lucca was educated on the multiple health risks of obesity as well as the benefit of weight loss to improve her health. She was advised of the need for long term treatment and the importance of lifestyle modifications.  AGREE: Multiple dietary modification options and treatment options were discussed and  Ieasha  agreed to the above obesity treatment plan.  I, Trixie Dredge, am acting as transcriptionist for Dennard Nip, MD  I have reviewed the above documentation for accuracy and completeness, and I agree with the above. -Dennard Nip, MD

## 2018-06-19 ENCOUNTER — Other Ambulatory Visit: Payer: Self-pay | Admitting: Internal Medicine

## 2018-06-19 ENCOUNTER — Ambulatory Visit (INDEPENDENT_AMBULATORY_CARE_PROVIDER_SITE_OTHER): Payer: Medicare Other | Admitting: Family Medicine

## 2018-06-19 VITALS — BP 116/69 | HR 81 | Temp 98.0°F | Ht 60.0 in | Wt 170.0 lb

## 2018-06-19 DIAGNOSIS — E7849 Other hyperlipidemia: Secondary | ICD-10-CM | POA: Diagnosis not present

## 2018-06-19 DIAGNOSIS — E559 Vitamin D deficiency, unspecified: Secondary | ICD-10-CM

## 2018-06-19 DIAGNOSIS — Z6833 Body mass index (BMI) 33.0-33.9, adult: Secondary | ICD-10-CM

## 2018-06-19 DIAGNOSIS — Z1231 Encounter for screening mammogram for malignant neoplasm of breast: Secondary | ICD-10-CM

## 2018-06-19 DIAGNOSIS — E669 Obesity, unspecified: Secondary | ICD-10-CM

## 2018-06-19 DIAGNOSIS — R1013 Epigastric pain: Secondary | ICD-10-CM

## 2018-06-19 DIAGNOSIS — E119 Type 2 diabetes mellitus without complications: Secondary | ICD-10-CM | POA: Diagnosis not present

## 2018-06-19 MED ORDER — METFORMIN HCL 500 MG PO TABS: 500.0000 mg | ORAL_TABLET | Freq: Three times a day (TID) | ORAL | 0 refills | Status: DC

## 2018-06-19 MED ORDER — VITAMIN D (ERGOCALCIFEROL) 1.25 MG (50000 UNIT) PO CAPS: 50000.0000 [IU] | ORAL_CAPSULE | ORAL | 0 refills | Status: DC

## 2018-06-19 NOTE — Progress Notes (Signed)
Office: [phone]  /  Fax: [phone]   HPI:   Chief Complaint: OBESITY Judy [name] is here to discuss her progress with her obesity treatment plan. She is on the Category 2 plan and is following her eating plan approximately 85 % of the time. She states she is exercising 0 minutes 0 times per week. Judy [name] struggled with eating protein on meal plan. She also reported some stomach discomfort which resulted in decreased appetite.  Her weight is 170 lb (77.1 kg) today and has gained 1 pound since her last visit. She has lost 13 lbs since starting treatment with [name].  Vitamin D Deficiency Judy [name] has a diagnosis of vitamin D deficiency. She is currently taking prescription Vit D, last level not at goal. She notes nausea and denies vomiting or muscle weakness.  Diabetes II Judy [name] has a diagnosis of diabetes type II. Judy [name]'s BGs log shows fasting BGs range between 101 and 174. She denies hypoglycemia or polyphagia. Last A1c was 7.3. She has been working on intensive lifestyle modifications including diet, exercise, and weight loss to help control her blood glucose levels.  Hyperlipidemia [name] has hyperlipidemia and has been trying to improve her cholesterol levels with intensive lifestyle modification including a low saturated fat diet, exercise and weight loss. She is not on medications and denies any chest pain, claudication or myalgias.  Epigastric Pain Judy [name] complains of epigastric pain. She notes nausea and diarrhea.  ALLERGIES: Allergies  Allergen Reactions  . No Known Allergies     MEDICATIONS: Current Outpatient Medications on File Prior to Visit  Medication Sig Dispense Refill  . clorazepate (TRANXENE) 3.75 MG tablet Take 3.75 mg by mouth at bedtime.     [name] diltiazem (CARDIZEM CD) 180 MG 24 hr capsule Take 180 mg by mouth daily. Hold for HR <55 or >105    . glucose blood (ACCU-CHEK AVIVA) test strip Test twice daily 100 each 0  . imipramine (TOFRANIL) 50 MG tablet Take 50 mg by  mouth at bedtime.    . Lancets (ACCU-CHEK SAFE-T PRO) lancets Test twice daily 100 each 0  . NON FORMULARY 1 each by Other route See admin instructions. CPAP machine nightly    . pantoprazole (PROTONIX) 40 MG tablet Take 40 mg by mouth daily.    [name] spironolactone (ALDACTONE) 25 MG tablet Take 25 mg by mouth daily.     No current facility-administered medications on file prior to visit.     PAST MEDICAL HISTORY: Past Medical History:  Diagnosis Date  . Anxiety   . Back pain   . Complication of anesthesia 2003   gallbladder surgery-resp. arrest-stop operation and pt. went to ICU  . Depression   . Diabetes ([place])   . Dyspnea   . Gout   . Hypertension   . Leg edema   . Pancreatitis, chronic ([place]) 03/2016  . Sleep apnea     PAST SURGICAL HISTORY: Past Surgical History:  Procedure Laterality Date  . CHOLECYSTECTOMY    . COLONOSCOPY  2013    SOCIAL HISTORY: Social History   Tobacco Use  . Smoking status: Never Smoker  . Smokeless tobacco: Never Used  Substance Use Topics  . Alcohol use: No    Alcohol/week: 0.0 oz  . Drug use: No    FAMILY HISTORY: Family History  Problem Relation Age of Onset  . Dementia Mother   . AAA (abdominal aortic aneurysm) Father   . Breast cancer Maternal Aunt     ROS: Review of Systems  Constitutional:  Negative for weight loss.  Cardiovascular: Negative for chest pain and claudication.  Gastrointestinal: Positive for diarrhea and nausea. Negative for vomiting.  Musculoskeletal: Negative for myalgias.       Negative muscle weakness  Endo/Heme/Allergies:       Negative hypoglycemia Negative polyphagia    PHYSICAL EXAM: Blood pressure 116/69, pulse 81, temperature 98 F (36.7 C), temperature source Oral, height 5' (1.524 m), weight 170 lb (77.1 kg), SpO2 97 %. Body mass index is 33.2 kg/m. Physical Exam  Constitutional: She is oriented to person, place, and time. She appears well-developed and well-nourished.  Cardiovascular:  Normal rate.  Pulmonary/Chest: Effort normal.  Musculoskeletal: Normal range of motion.  Neurological: She is oriented to person, place, and time.  Skin: Skin is warm and dry.  Psychiatric: She has a normal mood and affect. Her behavior is normal.  Vitals reviewed.   RECENT LABS AND TESTS: BMET    Component Value Date/Time   NA 139 01/21/2018 0859   K 4.4 01/21/2018 0859   CL 100 01/21/2018 0859   CO2 23 01/21/2018 0859   GLUCOSE 188 (H) 01/21/2018 0859   GLUCOSE 111 (H) 11/12/2016 1334   BUN 17 01/21/2018 0859   CREATININE 1.28 (H) 01/21/2018 0859   CALCIUM 9.5 01/21/2018 0859   GFRNONAA 41 (L) 01/21/2018 0859   GFRAA 47 (L) 01/21/2018 0859   Lab Results  Component Value Date   HGBA1C 7.3 (H) 01/21/2018   HGBA1C 6.2 (H) 03/22/2016   Lab Results  Component Value Date   INSULIN 44.8 (H) 01/21/2018   CBC    Component Value Date/Time   WBC 8.0 01/21/2018 0859   WBC 10.8 (H) 11/12/2016 1334   RBC 4.77 01/21/2018 0859   RBC 4.36 11/12/2016 1334   HGB 14.7 01/21/2018 0859   HCT 42.6 01/21/2018 0859   PLT 346 12/06/2016   MCV 89 01/21/2018 0859   MCH 30.8 01/21/2018 0859   MCH 30.7 11/12/2016 1334   MCHC 34.5 01/21/2018 0859   MCHC 33.1 11/12/2016 1334   RDW 13.3 01/21/2018 0859   LYMPHSABS 1.8 01/21/2018 0859   MONOABS 0.6 03/16/2016 2347   EOSABS 0.2 01/21/2018 0859   BASOSABS 0.0 01/21/2018 0859   Iron/TIBC/Ferritin/ %Sat No results found for: IRON, TIBC, FERRITIN, IRONPCTSAT Lipid Panel     Component Value Date/Time   CHOL 162 01/21/2018 0859   TRIG 162 (H) 01/21/2018 0859   HDL 40 01/21/2018 0859   LDLCALC 90 01/21/2018 0859   Hepatic Function Panel     Component Value Date/Time   PROT 6.9 01/21/2018 0859   ALBUMIN 4.2 01/21/2018 0859   AST 14 01/21/2018 0859   ALT 25 01/21/2018 0859   ALKPHOS 117 01/21/2018 0859   BILITOT 0.2 01/21/2018 0859      Component Value Date/Time   TSH 1.950 01/21/2018 0859  Results for KATURAH, KARAPETIAN (MRN  [id number]) as of 06/19/2018 11:06  Ref. Range 01/21/2018 08:59  Vitamin D, 25-Hydroxy Latest Ref Range: 30.0 - 100.0 ng/mL 6.7 (L)    ASSESSMENT AND PLAN: Vitamin D deficiency - Plan: CBC With Differential, VITAMIN D 25 Hydroxy (Vit-D Deficiency, Fractures), Vitamin D, Ergocalciferol, (DRISDOL) 50000 units CAPS capsule  Type 2 diabetes mellitus without complication, without long-term current use of insulin ([facility]) - Plan: Comprehensive metabolic panel, Hemoglobin A1c, Insulin, random, metFORMIN (GLUCOPHAGE) 500 MG tablet  Other hyperlipidemia - Plan: Lipid Panel With LDL/HDL Ratio  Epigastric pain - Plan: Amylase, Lipase, Amylase  Class 1 obesity with serious comorbidity and  body mass index (BMI) of 33.0 to 33.9 in adult, unspecified obesity type  PLAN:  Vitamin D Deficiency Lynnetta was informed that low vitamin D levels contributes to fatigue and are associated with obesity, breast, and colon cancer. Audree agrees to continue taking prescription Vit D @50 ,000 IU every week #4 and we will refill for 1 month. She will follow up for routine testing of vitamin D, at least 2-3 times per year. She was informed of the risk of over-replacement of vitamin D and agrees to not increase her dose unless she discusses this with [name] first. We will check labs and Coretta agrees to follow up with our clinic in 3 weeks.  Diabetes II Machell has been given extensive diabetes education by myself today including ideal fasting and post-prandial blood glucose readings, individual ideal Hgb A1c goals and hypoglycemia prevention. We discussed the importance of good blood sugar control to decrease the likelihood of diabetic complications such as nephropathy, neuropathy, limb loss, blindness, coronary artery disease, and death. We discussed the importance of intensive lifestyle modification including diet, exercise and weight loss as the first line treatment for diabetes. Alinna agrees to continue taking metformin 500 mg TID #90 and  we will refill for 1 month. We will check labs and Dreonna agrees to follow up with our clinic in 3 weeks.  Hyperlipidemia Sharyon was informed of the American Heart Association Guidelines emphasizing intensive lifestyle modifications as the first line treatment for hyperlipidemia. We discussed many lifestyle modifications today in depth, and Panagiota will continue to work on decreasing saturated fats such as fatty red meat, butter and many fried foods. She will also increase vegetables and lean protein in her diet and continue to work on diet, exercise, and weight loss efforts. We will check labs and Naiara agrees to follow up with our clinic in 3 weeks.  Epigastric Pain We will check labs and Aeralyn agrees to follow up with our clinic in 3 weeks.  Obesity Mikhayla is currently in the action stage of change. As such, her goal is to continue with weight loss efforts She has agreed to follow the Category 2 plan Yarithza has been instructed to work up to a goal of 150 minutes of combined cardio and strengthening exercise per week for weight loss and overall health benefits. We discussed the following Behavioral Modification Strategies today: increasing lean protein intake, decreasing simple carbohydrates  and work on meal planning and easy cooking plans   Genova has agreed to follow up with our clinic in 3 weeks. She was informed of the importance of frequent follow up visits to maximize her success with intensive lifestyle modifications for her multiple health conditions.   OBESITY BEHAVIORAL INTERVENTION VISIT  Today's visit was # 8 out of 22.  Starting weight: 183 lbs Starting date: 01/20/18 Today's weight : 170 lbs  Today's date: 06/19/2018 Total lbs lost to date: 13    ASK: We discussed the diagnosis of obesity with [name] today and Sherri agreed to give [name] permission to discuss obesity behavioral modification therapy today.  ASSESS: Gertie has the diagnosis of obesity and her BMI today is  33.2 Ireanna is in the action stage of change   ADVISE: Takasha was educated on the multiple health risks of obesity as well as the benefit of weight loss to improve her health. She was advised of the need for long term treatment and the importance of lifestyle modifications.  AGREE: Multiple dietary modification options and treatment options were discussed and  Rana agreed to the above obesity treatment plan.  I, Trixie Dredge, am acting as transcriptionist for Dr. Leafy Ro  I have reviewed the above documentation for accuracy and completeness, and I agree with the above. -Dennard Nip, MD

## 2018-06-20 LAB — COMPREHENSIVE METABOLIC PANEL
A/G RATIO: 1.6 (ref 1.2–2.2)
ALK PHOS: 100 IU/L (ref 39–117)
ALT: 16 IU/L (ref 0–32)
AST: 11 IU/L (ref 0–40)
Albumin: 4.1 g/dL (ref 3.5–4.8)
BUN/Creatinine Ratio: 19 (ref 12–28)
BUN: 26 mg/dL (ref 8–27)
Bilirubin Total: 0.3 mg/dL (ref 0.0–1.2)
CO2: 21 mmol/L (ref 20–29)
Calcium: 10.1 mg/dL (ref 8.7–10.3)
Chloride: 101 mmol/L (ref 96–106)
Creatinine, Ser: 1.34 mg/dL — ABNORMAL HIGH (ref 0.57–1.00)
GFR calc Af Amer: 45 mL/min/{1.73_m2} — ABNORMAL LOW (ref >59)
GFR calc non Af Amer: 39 mL/min/{1.73_m2} — ABNORMAL LOW (ref >59)
GLOBULIN, TOTAL: 2.6 g/dL (ref 1.5–4.5)
Glucose: 117 mg/dL — ABNORMAL HIGH (ref 65–99)
POTASSIUM: 5 mmol/L (ref 3.5–5.2)
SODIUM: 138 mmol/L (ref 134–144)
Total Protein: 6.7 g/dL (ref 6.0–8.5)

## 2018-06-20 LAB — HEMOGLOBIN A1C
ESTIMATED AVERAGE GLUCOSE: 151 mg/dL
Hgb A1c MFr Bld: 6.9 % — ABNORMAL HIGH (ref 4.8–5.6)

## 2018-06-20 LAB — CBC WITH DIFFERENTIAL
BASOS: 0 %
Basophils Absolute: 0 10*3/uL (ref 0.0–0.2)
EOS (ABSOLUTE): 0.1 10*3/uL (ref 0.0–0.4)
Eos: 1 %
HEMATOCRIT: 42.5 % (ref 34.0–46.6)
HEMOGLOBIN: 13.8 g/dL (ref 11.1–15.9)
IMMATURE GRANULOCYTES: 1 %
Immature Grans (Abs): 0.1 10*3/uL (ref 0.0–0.1)
LYMPHS ABS: 2.4 10*3/uL (ref 0.7–3.1)
LYMPHS: 22 %
MCH: 30.1 pg (ref 26.6–33.0)
MCHC: 32.5 g/dL (ref 31.5–35.7)
MCV: 93 fL (ref 79–97)
Monocytes Absolute: 0.5 10*3/uL (ref 0.1–0.9)
Monocytes: 5 %
NEUTROS ABS: 7.8 10*3/uL — AB (ref 1.4–7.0)
Neutrophils: 71 %
RBC: 4.58 x10E6/uL (ref 3.77–5.28)
RDW: 13.8 % (ref 12.3–15.4)
WBC: 10.9 10*3/uL — AB (ref 3.4–10.8)

## 2018-06-20 LAB — LIPID PANEL WITH LDL/HDL RATIO
Cholesterol, Total: 184 mg/dL (ref 100–199)
HDL: 45 mg/dL (ref >39)
LDL Calculated: 107 mg/dL — ABNORMAL HIGH (ref 0–99)
LDl/HDL Ratio: 2.4 ratio (ref 0.0–3.2)
Triglycerides: 162 mg/dL — ABNORMAL HIGH (ref 0–149)
VLDL CHOLESTEROL CAL: 32 mg/dL (ref 5–40)

## 2018-06-20 LAB — VITAMIN D 25 HYDROXY (VIT D DEFICIENCY, FRACTURES): VIT D 25 HYDROXY: 24.9 ng/mL — AB (ref 30.0–100.0)

## 2018-06-20 LAB — LIPASE: LIPASE: 30 U/L (ref 14–85)

## 2018-06-20 LAB — INSULIN, RANDOM: INSULIN: 26.1 u[IU]/mL — ABNORMAL HIGH (ref 2.6–24.9)

## 2018-07-10 ENCOUNTER — Ambulatory Visit
Admission: RE | Admit: 2018-07-10 | Discharge: 2018-07-10 | Disposition: A | Discharge status: Home / Self Care | Payer: Medicare Other | Source: Ambulatory Visit | Attending: Internal Medicine | Admitting: Internal Medicine

## 2018-07-10 ENCOUNTER — Encounter (INDEPENDENT_AMBULATORY_CARE_PROVIDER_SITE_OTHER): Payer: Self-pay

## 2018-07-10 ENCOUNTER — Ambulatory Visit (INDEPENDENT_AMBULATORY_CARE_PROVIDER_SITE_OTHER): Payer: Medicare Other | Admitting: Physician Assistant

## 2018-07-10 DIAGNOSIS — Z1231 Encounter for screening mammogram for malignant neoplasm of breast: Secondary | ICD-10-CM

## 2018-07-15 ENCOUNTER — Ambulatory Visit (INDEPENDENT_AMBULATORY_CARE_PROVIDER_SITE_OTHER): Payer: Self-pay | Admitting: Physician Assistant

## 2018-07-15 ENCOUNTER — Encounter (INDEPENDENT_AMBULATORY_CARE_PROVIDER_SITE_OTHER): Payer: Self-pay

## 2018-07-15 ENCOUNTER — Ambulatory Visit (INDEPENDENT_AMBULATORY_CARE_PROVIDER_SITE_OTHER): Payer: Medicare Other | Admitting: Physician Assistant

## 2018-07-15 VITALS — BP 114/73 | HR 95 | Temp 98.9°F | Ht 60.0 in | Wt 173.0 lb

## 2018-07-15 DIAGNOSIS — E119 Type 2 diabetes mellitus without complications: Secondary | ICD-10-CM | POA: Diagnosis not present

## 2018-07-15 DIAGNOSIS — E669 Obesity, unspecified: Secondary | ICD-10-CM

## 2018-07-15 DIAGNOSIS — Z6833 Body mass index (BMI) 33.0-33.9, adult: Secondary | ICD-10-CM

## 2018-07-15 DIAGNOSIS — E559 Vitamin D deficiency, unspecified: Secondary | ICD-10-CM

## 2018-07-15 MED ORDER — ACCU-CHEK SAFE-T PRO LANCETS MISC: 0 refills | Status: DC

## 2018-07-15 MED ORDER — GLUCOSE BLOOD VI STRP: ORAL_STRIP | 0 refills | Status: DC

## 2018-07-15 MED ORDER — VITAMIN D (ERGOCALCIFEROL) 1.25 MG (50000 UNIT) PO CAPS: 50000.0000 [IU] | ORAL_CAPSULE | ORAL | 0 refills | Status: DC

## 2018-07-15 NOTE — Progress Notes (Signed)
Office: [phone]  /  Fax: [phone]   HPI:   Chief Complaint: OBESITY Judy [name] is here to discuss her progress with her obesity treatment plan. She is on the Category 2 plan and is following her eating plan approximately 80 % of the time. She states she is exercising 0 minutes 0 times per week. Judy [name] has been celebrating her birthday all week and reports eating out six times. She has been eating mores simple carbohydrates. Judy [name] is ready to get back on track. Her weight is 173 lb (78.5 kg) today and has had a weight gain of 3 pounds over a period of 3 to 4 weeks since her last visit. She has lost 10 lbs since starting treatment with [name].  Diabetes II Judy [name] has a diagnosis of diabetes type II. Judy [name] states fasting BGs range between 101 and 148 and she denies any hypoglycemic episodes. She is currently on metformin. Last A1c was at 6.9 She has been working on intensive lifestyle modifications including diet, exercise, and weight loss to help control her blood glucose levels. She denies nausea, vomiting and diarrhea.  Vitamin D deficiency Judy [name] has a diagnosis of vitamin D deficiency. She is currently taking prescription vit D and last level was not at goal. Judy [name] denies nausea, vomiting or muscle weakness.  ALLERGIES: Allergies  Allergen Reactions  . No Known Allergies     MEDICATIONS: Current Outpatient Medications on File Prior to Visit  Medication Sig Dispense Refill  . clorazepate (TRANXENE) 3.75 MG tablet Take 3.75 mg by mouth at bedtime.     [name] diltiazem (CARDIZEM CD) 180 MG 24 hr capsule Take 180 mg by mouth daily. Hold for HR <55 or >105    . imipramine (TOFRANIL) 50 MG tablet Take 50 mg by mouth at bedtime.    . metFORMIN (GLUCOPHAGE) 500 MG tablet Take 1 tablet (500 mg total) by mouth 3 (three) times daily. 90 tablet 0  . NON FORMULARY 1 each by Other route See admin instructions. CPAP machine nightly    . pantoprazole (PROTONIX) 40 MG tablet Take 40 mg by mouth daily.      [name] spironolactone (ALDACTONE) 25 MG tablet Take 25 mg by mouth daily.     No current facility-administered medications on file prior to visit.     PAST MEDICAL HISTORY: Past Medical History:  Diagnosis Date  . Anxiety   . Back pain   . Complication of anesthesia 2003   gallbladder surgery-resp. arrest-stop operation and pt. went to ICU  . Depression   . Diabetes ([place])   . Dyspnea   . Gout   . Hypertension   . Leg edema   . Pancreatitis, chronic ([place]) 03/2016  . Sleep apnea     PAST SURGICAL HISTORY: Past Surgical History:  Procedure Laterality Date  . CHOLECYSTECTOMY    . COLONOSCOPY  2013    SOCIAL HISTORY: Social History   Tobacco Use  . Smoking status: Never Smoker  . Smokeless tobacco: Never Used  Substance Use Topics  . Alcohol use: No    Alcohol/week: 0.0 standard drinks  . Drug use: No    FAMILY HISTORY: Family History  Problem Relation Age of Onset  . Dementia Mother   . AAA (abdominal aortic aneurysm) Father   . Breast cancer Maternal Aunt     ROS: Review of Systems  Constitutional: Negative for weight loss.  Gastrointestinal: Negative for diarrhea, nausea and vomiting.  Musculoskeletal:       Negative for muscle weakness  Endo/Heme/Allergies:       Negative for hypoglycemia    PHYSICAL EXAM: Blood pressure 114/73, pulse 95, temperature 98.9 F (37.2 C), temperature source Oral, height 5' (1.524 m), weight 173 lb (78.5 kg), SpO2 95 %. Body mass index is 33.79 kg/m. Physical Exam  Constitutional: She is oriented to person, place, and time. She appears well-developed and well-nourished.  Cardiovascular: Normal rate.  Pulmonary/Chest: Effort normal.  Musculoskeletal: Normal range of motion.  Neurological: She is oriented to person, place, and time.  Skin: Skin is warm and dry.  Psychiatric: She has a normal mood and affect. Her behavior is normal.  Vitals reviewed.   RECENT LABS AND TESTS: BMET    Component Value Date/Time   NA 138  06/19/2018 0958   K 5.0 06/19/2018 0958   CL 101 06/19/2018 0958   CO2 21 06/19/2018 0958   GLUCOSE 117 (H) 06/19/2018 0958   GLUCOSE 111 (H) 11/12/2016 1334   BUN 26 06/19/2018 0958   CREATININE 1.34 (H) 06/19/2018 0958   CALCIUM 10.1 06/19/2018 0958   GFRNONAA 39 (L) 06/19/2018 0958   GFRAA 45 (L) 06/19/2018 0958   Lab Results  Component Value Date   HGBA1C 6.9 (H) 06/19/2018   HGBA1C 7.3 (H) 01/21/2018   HGBA1C 6.2 (H) 03/22/2016   Lab Results  Component Value Date   INSULIN 26.1 (H) 06/19/2018   INSULIN 44.8 (H) 01/21/2018   CBC    Component Value Date/Time   WBC 10.9 (H) 06/19/2018 0958   WBC 10.8 (H) 11/12/2016 1334   RBC 4.58 06/19/2018 0958   RBC 4.36 11/12/2016 1334   HGB 13.8 06/19/2018 0958   HCT 42.5 06/19/2018 0958   PLT 346 12/06/2016   MCV 93 06/19/2018 0958   MCH 30.1 06/19/2018 0958   MCH 30.7 11/12/2016 1334   MCHC 32.5 06/19/2018 0958   MCHC 33.1 11/12/2016 1334   RDW 13.8 06/19/2018 0958   LYMPHSABS 2.4 06/19/2018 0958   MONOABS 0.6 03/16/2016 2347   EOSABS 0.1 06/19/2018 0958   BASOSABS 0.0 06/19/2018 0958   Iron/TIBC/Ferritin/ %Sat No results found for: IRON, TIBC, FERRITIN, IRONPCTSAT Lipid Panel     Component Value Date/Time   CHOL 184 06/19/2018 0958   TRIG 162 (H) 06/19/2018 0958   HDL 45 06/19/2018 0958   LDLCALC 107 (H) 06/19/2018 0958   Hepatic Function Panel     Component Value Date/Time   PROT 6.7 06/19/2018 0958   ALBUMIN 4.1 06/19/2018 0958   AST 11 06/19/2018 0958   ALT 16 06/19/2018 0958   ALKPHOS 100 06/19/2018 0958   BILITOT 0.3 06/19/2018 0958      Component Value Date/Time   TSH 1.950 01/21/2018 0859   Results for Judy, [name] (MRN [id number]) as of 07/15/2018 17:45  Ref. Range 06/19/2018 09:58  Vitamin D, 25-Hydroxy Latest Ref Range: 30.0 - 100.0 ng/mL 24.9 (L)   ASSESSMENT AND PLAN: Type 2 diabetes mellitus without complication, without long-term current use of insulin (HCC) - Plan: Lancets (ACCU-CHEK  SAFE-T PRO) lancets, glucose blood (ACCU-CHEK AVIVA) test strip  Vitamin D deficiency - Plan: Vitamin D, Ergocalciferol, (DRISDOL) 50000 units CAPS capsule  Class 1 obesity with serious comorbidity and body mass index (BMI) of 33.0 to 33.9 in adult, unspecified obesity type  PLAN:  Diabetes II Florinda has been given extensive diabetes education by myself today including ideal fasting and post-prandial blood glucose readings, individual ideal Hgb A1c goals and hypoglycemia prevention. We discussed the importance of good blood sugar control  to decrease the likelihood of diabetic complications such as nephropathy, neuropathy, limb loss, blindness, coronary artery disease, and death. We discussed the importance of intensive lifestyle modification including diet, exercise and weight loss as the first line treatment for diabetes. Aishi agrees to continue her diabetes medications and we will refill lancets #60 with no refills and refill test strips #60 with no refills. Shannelle agreed to follow up at the agreed upon time.  Vitamin D Deficiency Shondra was informed that low vitamin D levels contributes to fatigue and are associated with obesity, breast, and colon cancer. She agrees to continue to take prescription Vit D @50 ,000 IU every week #4 with no refills and will follow up for routine testing of vitamin D, at least 2-3 times per year. She was informed of the risk of over-replacement of vitamin D and agrees to not increase her dose unless she discusses this with [name] first. Jashley agrees to follow up as directed.  Obesity Carlon is currently in the action stage of change. As such, her goal is to continue with weight loss efforts She has agreed to follow the Category 2 plan Rikia has been instructed to work up to a goal of 150 minutes of combined cardio and strengthening exercise per week for weight loss and overall health benefits. We discussed the following Behavioral Modification Strategies today: planning  for success and work on meal planning and easy cooking plans  Damariz has agreed to follow up with our clinic in 3 weeks. She was informed of the importance of frequent follow up visits to maximize her success with intensive lifestyle modifications for her multiple health conditions.   OBESITY BEHAVIORAL INTERVENTION VISIT  Today's visit was # 9 out of 22.  Starting weight: 183 lbs Starting date: 01/20/18 Today's weight : 173 lbs  Today's date: 07/15/2018 Total lbs lost to date: 10    ASK: We discussed the diagnosis of obesity with [name] today and Cyanna agreed to give [name] permission to discuss obesity behavioral modification therapy today.  ASSESS: Alycea has the diagnosis of obesity and her BMI today is 33.79 Alessia is in the action stage of change   ADVISE: Anola was educated on the multiple health risks of obesity as well as the benefit of weight loss to improve her health. She was advised of the need for long term treatment and the importance of lifestyle modifications.  AGREE: Multiple dietary modification options and treatment options were discussed and  Nieve agreed to the above obesity treatment plan.  Corey Skains, am acting as transcriptionist for Abby Potash, PA-C I, Abby Potash, PA-C have reviewed above note and agree with its content

## 2018-08-05 ENCOUNTER — Ambulatory Visit (INDEPENDENT_AMBULATORY_CARE_PROVIDER_SITE_OTHER): Payer: Self-pay | Admitting: Physician Assistant

## 2018-08-13 ENCOUNTER — Encounter (INDEPENDENT_AMBULATORY_CARE_PROVIDER_SITE_OTHER): Payer: Self-pay | Admitting: Physician Assistant

## 2018-08-13 ENCOUNTER — Ambulatory Visit (INDEPENDENT_AMBULATORY_CARE_PROVIDER_SITE_OTHER): Payer: Medicare Other | Admitting: Physician Assistant

## 2018-08-13 VITALS — BP 117/73 | HR 89 | Temp 98.2°F | Ht 60.0 in | Wt 171.0 lb

## 2018-08-13 DIAGNOSIS — Z6833 Body mass index (BMI) 33.0-33.9, adult: Secondary | ICD-10-CM | POA: Diagnosis not present

## 2018-08-13 DIAGNOSIS — E119 Type 2 diabetes mellitus without complications: Secondary | ICD-10-CM

## 2018-08-13 DIAGNOSIS — E669 Obesity, unspecified: Secondary | ICD-10-CM | POA: Diagnosis not present

## 2018-08-14 NOTE — Progress Notes (Signed)
Office: [phone]  /  Fax: [phone]   HPI:   Chief Complaint: OBESITY Judy [name] is here to discuss her progress with her obesity treatment plan. She is on the Category 2 plan and is following her eating plan approximately 85 % of the time. She states she is exercising 0 minutes 0 times per week.  Judy [name] did well with weight loss. She reports that she has been having a few sweet cravings and she would like to eat a donut now and then. Other than those cravings, she has been doing well on the plan. Her weight is 171 lb (77.6 kg) today and has had a weight loss of 2 pounds over a period of 4 weeks since her last visit. She has lost 12 lbs since starting treatment with [name].  Diabetes II Judy [name] has a diagnosis of diabetes type II. Judy [name] states fasting BGs range between 121 and 151 and she denies any hypoglycemic episodes or polyphagia. Last A1c was at 6.9 She has been working on intensive lifestyle modifications including diet, exercise, and weight loss to help control her blood glucose levels.  ALLERGIES: Allergies  Allergen Reactions   No Known Allergies     MEDICATIONS: Current Outpatient Medications on File Prior to Visit  Medication Sig Dispense Refill   clorazepate (TRANXENE) 3.75 MG tablet Take 3.75 mg by mouth at bedtime.      diltiazem (CARDIZEM CD) 180 MG 24 hr capsule Take 180 mg by mouth daily. Hold for HR <55 or >105     glucose blood (ACCU-CHEK AVIVA) test strip Test twice daily 60 each 0   imipramine (TOFRANIL) 50 MG tablet Take 50 mg by mouth at bedtime.     Lancets (ACCU-CHEK SAFE-T PRO) lancets Test twice daily 60 each 0   metFORMIN (GLUCOPHAGE) 500 MG tablet Take 1 tablet (500 mg total) by mouth 3 (three) times daily. 90 tablet 0   NON FORMULARY 1 each by Other route See admin instructions. CPAP machine nightly     pantoprazole (PROTONIX) 40 MG tablet Take 40 mg by mouth daily.     spironolactone (ALDACTONE) 25 MG tablet Take 25 mg by mouth daily.      Vitamin D, Ergocalciferol, (DRISDOL) 50000 units CAPS capsule Take 1 capsule (50,000 Units total) by mouth every 7 (seven) days. 4 capsule 0   No current facility-administered medications on file prior to visit.     PAST MEDICAL HISTORY: Past Medical History:  Diagnosis Date   Anxiety    Back pain    Complication of anesthesia 2003   gallbladder surgery-resp. arrest-stop operation and pt. went to ICU   Depression    Diabetes ([place])    Dyspnea    Gout    Hypertension    Leg edema    Pancreatitis, chronic ([place]) 03/2016   Sleep apnea     PAST SURGICAL HISTORY: Past Surgical History:  Procedure Laterality Date   CHOLECYSTECTOMY     COLONOSCOPY  2013    SOCIAL HISTORY: Social History   Tobacco Use   Smoking status: Never Smoker   Smokeless tobacco: Never Used  Substance Use Topics   Alcohol use: No    Alcohol/week: 0.0 standard drinks   Drug use: No    FAMILY HISTORY: Family History  Problem Relation Age of Onset   Dementia Mother    AAA (abdominal aortic aneurysm) Father    Breast cancer Maternal Aunt     ROS: Review of Systems  Constitutional: Positive for weight loss.  Endo/Heme/Allergies:  Negative for hypoglycemia negative for polyphagia    PHYSICAL EXAM: Blood pressure 117/73, pulse 89, temperature 98.2 F (36.8 C), temperature source Oral, height 5' (1.524 m), weight 171 lb (77.6 kg), SpO2 94 %. Body mass index is 33.4 kg/m. Physical Exam  Constitutional: She is oriented to person, place, and time. She appears well-developed and well-nourished.  Cardiovascular: Normal rate.  Pulmonary/Chest: Effort normal.  Musculoskeletal: Normal range of motion.  Neurological: She is oriented to person, place, and time.  Skin: Skin is warm and dry.  Psychiatric: She has a normal mood and affect. Her behavior is normal.  Vitals reviewed.   RECENT LABS AND TESTS: BMET    Component Value Date/Time   NA 138 06/19/2018 0958   K 5.0  06/19/2018 0958   CL 101 06/19/2018 0958   CO2 21 06/19/2018 0958   GLUCOSE 117 (H) 06/19/2018 0958   GLUCOSE 111 (H) 11/12/2016 1334   BUN 26 06/19/2018 0958   CREATININE 1.34 (H) 06/19/2018 0958   CALCIUM 10.1 06/19/2018 0958   GFRNONAA 39 (L) 06/19/2018 0958   GFRAA 45 (L) 06/19/2018 0958   Lab Results  Component Value Date   HGBA1C 6.9 (H) 06/19/2018   HGBA1C 7.3 (H) 01/21/2018   HGBA1C 6.2 (H) 03/22/2016   Lab Results  Component Value Date   INSULIN 26.1 (H) 06/19/2018   INSULIN 44.8 (H) 01/21/2018   CBC    Component Value Date/Time   WBC 10.9 (H) 06/19/2018 0958   WBC 10.8 (H) 11/12/2016 1334   RBC 4.58 06/19/2018 0958   RBC 4.36 11/12/2016 1334   HGB 13.8 06/19/2018 0958   HCT 42.5 06/19/2018 0958   PLT 346 12/06/2016   MCV 93 06/19/2018 0958   MCH 30.1 06/19/2018 0958   MCH 30.7 11/12/2016 1334   MCHC 32.5 06/19/2018 0958   MCHC 33.1 11/12/2016 1334   RDW 13.8 06/19/2018 0958   LYMPHSABS 2.4 06/19/2018 0958   MONOABS 0.6 03/16/2016 2347   EOSABS 0.1 06/19/2018 0958   BASOSABS 0.0 06/19/2018 0958   Iron/TIBC/Ferritin/ %Sat No results found for: IRON, TIBC, FERRITIN, IRONPCTSAT Lipid Panel     Component Value Date/Time   CHOL 184 06/19/2018 0958   TRIG 162 (H) 06/19/2018 0958   HDL 45 06/19/2018 0958   LDLCALC 107 (H) 06/19/2018 0958   Hepatic Function Panel     Component Value Date/Time   PROT 6.7 06/19/2018 0958   ALBUMIN 4.1 06/19/2018 0958   AST 11 06/19/2018 0958   ALT 16 06/19/2018 0958   ALKPHOS 100 06/19/2018 0958   BILITOT 0.3 06/19/2018 0958      Component Value Date/Time   TSH 1.950 01/21/2018 0859   Results for PATT, STEINHARDT (MRN [id number]) as of 08/14/2018 13:55  Ref. Range 06/19/2018 09:58  Vitamin D, 25-Hydroxy Latest Ref Range: 30.0 - 100.0 ng/mL 24.9 (L)   ASSESSMENT AND PLAN: Type 2 diabetes mellitus without complication, without long-term current use of insulin (HCC)  Class 1 obesity with serious comorbidity and body  mass index (BMI) of 33.0 to 33.9 in adult, unspecified obesity type  PLAN:  Diabetes II Judy [name] has been given extensive diabetes education by myself today including ideal fasting and post-prandial blood glucose readings, individual ideal Hgb A1c goals and hypoglycemia prevention. We discussed the importance of good blood sugar control to decrease the likelihood of diabetic complications such as nephropathy, neuropathy, limb loss, blindness, coronary artery disease, and death. We discussed the importance of intensive lifestyle modification including diet, exercise and  weight loss as the first line treatment for diabetes. Linzee agrees to continue with metformin, diet and weight loss and follow up at the agreed upon time.  Obesity Judy [name] is currently in the action stage of change. As such, her goal is to continue with weight loss efforts She has agreed to follow the Category 2 plan Judy [name] has been instructed to work up to a goal of 150 minutes of combined cardio and strengthening exercise per week for weight loss and overall health benefits. We discussed the following Behavioral Modification Strategies today: planning for success, work on meal planning and easy cooking plans and ways to avoid boredom eating  Judy [name] has agreed to follow up with our clinic in 3 weeks. She was informed of the importance of frequent follow up visits to maximize her success with intensive lifestyle modifications for her multiple health conditions.   OBESITY BEHAVIORAL INTERVENTION VISIT  Today's visit was # 10   Starting weight: 183 lbs Starting date: 01/20/18 Today's weight : 171 lbs Today's date: 08/13/2018 Total lbs lost to date: 12 At least 15 minutes were spent on discussing the following behavioral intervention visit.   ASK: We discussed the diagnosis of obesity with Judy [name] today and Judy [name] agreed to give [name] permission to discuss obesity behavioral modification therapy today.  ASSESS: Judy [name] has the  diagnosis of obesity and her BMI today is 33.4 Judy [name] is in the action stage of change   ADVISE: Judy [name] was educated on the multiple health risks of obesity as well as the benefit of weight loss to improve her health. She was advised of the need for long term treatment and the importance of lifestyle modifications to improve her current health and to decrease her risk of future health problems.  AGREE: Multiple dietary modification options and treatment options were discussed and  Judy [name] agreed to follow the recommendations documented in the above note.  ARRANGE: Sharnise was educated on the importance of frequent visits to treat obesity as outlined per CMS and USPSTF guidelines and agreed to schedule her next follow up appointment today.  Corey Skains, am acting as transcriptionist for Abby Potash, PA-C I, Abby Potash, PA-C have reviewed above note and agree with its content

## 2018-08-20 LAB — CBC AND DIFFERENTIAL
HEMATOCRIT: 43 (ref 36–46)
HEMOGLOBIN: 15 (ref 12.0–16.0)
Platelets: 298 (ref 150–399)
WBC: 9.3

## 2018-08-20 LAB — LIPID PANEL
CHOLESTEROL: 181 (ref 0–200)
HDL: 45 (ref 35–70)
LDL CALC: 30
Triglycerides: 151 (ref 40–160)

## 2018-08-20 LAB — BASIC METABOLIC PANEL
BUN: 31 — AB (ref 4–21)
Creatinine: 1.4 — AB (ref 0.5–1.1)
Glucose: 130
POTASSIUM: 5.4 — AB (ref 3.4–5.3)
SODIUM: 138 (ref 137–147)

## 2018-08-20 LAB — HEPATIC FUNCTION PANEL
ALT: 18 (ref 7–35)
AST: 15 (ref 13–35)

## 2018-08-20 LAB — HEMOGLOBIN A1C: Hgb A1c MFr Bld: 6.4 — AB (ref 4.0–6.0)

## 2018-09-03 ENCOUNTER — Ambulatory Visit (INDEPENDENT_AMBULATORY_CARE_PROVIDER_SITE_OTHER): Payer: Medicare Other | Admitting: Physician Assistant

## 2018-09-03 ENCOUNTER — Encounter (INDEPENDENT_AMBULATORY_CARE_PROVIDER_SITE_OTHER): Payer: Self-pay | Admitting: Physician Assistant

## 2018-09-03 VITALS — BP 105/64 | HR 81 | Temp 98.2°F | Ht 60.0 in | Wt 170.0 lb

## 2018-09-03 DIAGNOSIS — E119 Type 2 diabetes mellitus without complications: Secondary | ICD-10-CM

## 2018-09-03 DIAGNOSIS — Z6833 Body mass index (BMI) 33.0-33.9, adult: Secondary | ICD-10-CM

## 2018-09-03 DIAGNOSIS — E559 Vitamin D deficiency, unspecified: Secondary | ICD-10-CM

## 2018-09-03 DIAGNOSIS — E669 Obesity, unspecified: Secondary | ICD-10-CM | POA: Diagnosis not present

## 2018-09-03 MED ORDER — VITAMIN D (ERGOCALCIFEROL) 1.25 MG (50000 UNIT) PO CAPS: 50000.0000 [IU] | ORAL_CAPSULE | ORAL | 0 refills | Status: DC

## 2018-09-03 MED ORDER — GLUCOSE BLOOD VI STRP: ORAL_STRIP | 0 refills | Status: DC

## 2018-09-03 NOTE — Progress Notes (Signed)
Office: [phone]  /  Fax: [phone]   HPI:   Chief Complaint: OBESITY Judy [name] is here to discuss her progress with her obesity treatment plan. She is on the Category 2 plan and is following her eating plan approximately 85 % of the time. She states she is exercising 0 minutes 0 times per week. Judy [name] did very with weight loss. She reports not getting all of her protein in at times due to extra work hours recently.  Her weight is 170 lb (77.1 kg) today and has had a weight loss of 1 pound over a period of 3 weeks since her last visit. She has lost 13 lbs since starting treatment with [name].  Vitamin D Deficiency Judy [name] has a diagnosis of vitamin D deficiency. She is currently taking prescription Vit D and denies nausea, vomiting or muscle weakness.  Diabetes II Judy [name] has a diagnosis of diabetes type II. Judy [name] states she has not been checking blood sugars due to being out of her strips. She had labs done at her primary care physician office. She denies any hypoglycemic episodes. She has been working on intensive lifestyle modifications including diet, exercise, and weight loss to help control her blood glucose levels.  ALLERGIES: Allergies  Allergen Reactions  . No Known Allergies     MEDICATIONS: Current Outpatient Medications on File Prior to Visit  Medication Sig Dispense Refill  . clorazepate (TRANXENE) 3.75 MG tablet Take 3.75 mg by mouth at bedtime.     [name] diltiazem (CARDIZEM CD) 180 MG 24 hr capsule Take 180 mg by mouth daily. Hold for HR <55 or >105    . imipramine (TOFRANIL) 50 MG tablet Take 50 mg by mouth at bedtime.    . Lancets (ACCU-CHEK SAFE-T PRO) lancets Test twice daily 60 each 0  . metFORMIN (GLUCOPHAGE) 500 MG tablet Take 1 tablet (500 mg total) by mouth 3 (three) times daily. (Patient taking differently: Take 500 mg by mouth 3 (three) times daily. 1/2 tab Qam and 1 tab QHS) 90 tablet 0  . NON FORMULARY 1 each by Other route See admin instructions. CPAP machine  nightly    . pantoprazole (PROTONIX) 40 MG tablet Take 40 mg by mouth daily.    [name] spironolactone (ALDACTONE) 25 MG tablet Take 25 mg by mouth daily.     No current facility-administered medications on file prior to visit.     PAST MEDICAL HISTORY: Past Medical History:  Diagnosis Date  . Anxiety   . Back pain   . Complication of anesthesia 2003   gallbladder surgery-resp. arrest-stop operation and pt. went to ICU  . Depression   . Diabetes ([place])   . Dyspnea   . Gout   . Hypertension   . Leg edema   . Pancreatitis, chronic ([place]) 03/2016  . Sleep apnea     PAST SURGICAL HISTORY: Past Surgical History:  Procedure Laterality Date  . CHOLECYSTECTOMY    . COLONOSCOPY  2013    SOCIAL HISTORY: Social History   Tobacco Use  . Smoking status: Never Smoker  . Smokeless tobacco: Never Used  Substance Use Topics  . Alcohol use: No    Alcohol/week: 0.0 standard drinks  . Drug use: No    FAMILY HISTORY: Family History  Problem Relation Age of Onset  . Dementia Mother   . AAA (abdominal aortic aneurysm) Father   . Breast cancer Maternal Aunt     ROS: Review of Systems  Constitutional: Positive for weight loss.  Gastrointestinal: Negative for  nausea and vomiting.  Musculoskeletal:       Negative muscle weakness  Endo/Heme/Allergies:       Negative hypoglycemia    PHYSICAL EXAM: Blood pressure 105/64, pulse 81, temperature 98.2 F (36.8 C), temperature source Oral, height 5' (1.524 m), weight 170 lb (77.1 kg), SpO2 96 %. Body mass index is 33.2 kg/m. Physical Exam  Constitutional: She is oriented to person, place, and time. She appears well-developed and well-nourished.  Cardiovascular: Normal rate.  Pulmonary/Chest: Effort normal.  Musculoskeletal: Normal range of motion.  Neurological: She is oriented to person, place, and time.  Skin: Skin is warm and dry.  Psychiatric: She has a normal mood and affect. Her behavior is normal.  Vitals reviewed.   RECENT  LABS AND TESTS: BMET    Component Value Date/Time   NA 138 06/19/2018 0958   K 5.0 06/19/2018 0958   CL 101 06/19/2018 0958   CO2 21 06/19/2018 0958   GLUCOSE 117 (H) 06/19/2018 0958   GLUCOSE 111 (H) 11/12/2016 1334   BUN 26 06/19/2018 0958   CREATININE 1.34 (H) 06/19/2018 0958   CALCIUM 10.1 06/19/2018 0958   GFRNONAA 39 (L) 06/19/2018 0958   GFRAA 45 (L) 06/19/2018 0958   Lab Results  Component Value Date   HGBA1C 6.9 (H) 06/19/2018   HGBA1C 7.3 (H) 01/21/2018   HGBA1C 6.2 (H) 03/22/2016   Lab Results  Component Value Date   INSULIN 26.1 (H) 06/19/2018   INSULIN 44.8 (H) 01/21/2018   CBC    Component Value Date/Time   WBC 10.9 (H) 06/19/2018 0958   WBC 10.8 (H) 11/12/2016 1334   RBC 4.58 06/19/2018 0958   RBC 4.36 11/12/2016 1334   HGB 13.8 06/19/2018 0958   HCT 42.5 06/19/2018 0958   PLT 346 12/06/2016   MCV 93 06/19/2018 0958   MCH 30.1 06/19/2018 0958   MCH 30.7 11/12/2016 1334   MCHC 32.5 06/19/2018 0958   MCHC 33.1 11/12/2016 1334   RDW 13.8 06/19/2018 0958   LYMPHSABS 2.4 06/19/2018 0958   MONOABS 0.6 03/16/2016 2347   EOSABS 0.1 06/19/2018 0958   BASOSABS 0.0 06/19/2018 0958   Iron/TIBC/Ferritin/ %Sat No results found for: IRON, TIBC, FERRITIN, IRONPCTSAT Lipid Panel     Component Value Date/Time   CHOL 184 06/19/2018 0958   TRIG 162 (H) 06/19/2018 0958   HDL 45 06/19/2018 0958   LDLCALC 107 (H) 06/19/2018 0958   Hepatic Function Panel     Component Value Date/Time   PROT 6.7 06/19/2018 0958   ALBUMIN 4.1 06/19/2018 0958   AST 11 06/19/2018 0958   ALT 16 06/19/2018 0958   ALKPHOS 100 06/19/2018 0958   BILITOT 0.3 06/19/2018 0958      Component Value Date/Time   TSH 1.950 01/21/2018 0859  Results for SHERINE, CORTESE (MRN [id number]) as of 09/03/2018 14:49  Ref. Range 06/19/2018 09:58  Vitamin D, 25-Hydroxy Latest Ref Range: 30.0 - 100.0 ng/mL 24.9 (L)    ASSESSMENT AND PLAN: Vitamin D deficiency - Plan: Vitamin D, Ergocalciferol,  (DRISDOL) 50000 units CAPS capsule  Type 2 diabetes mellitus without complication, without long-term current use of insulin (HCC) - Plan: glucose blood (ACCU-CHEK AVIVA) test strip  Class 1 obesity with serious comorbidity and body mass index (BMI) of 33.0 to 33.9 in adult, unspecified obesity type  PLAN:  Vitamin D Deficiency Christinna was informed that low vitamin D levels contributes to fatigue and are associated with obesity, breast, and colon cancer. Cathi agrees to continue taking  prescription Vit D @50 ,000 IU every week #4 and we will refill for 1 month. She will follow up for routine testing of vitamin D, at least 2-3 times per year. She was informed of the risk of over-replacement of vitamin D and agrees to not increase her dose unless she discusses this with [name] first. Amyia agrees to follow up with our clinic in 3 weeks.  Diabetes II Bianka has been given extensive diabetes education by myself today including ideal fasting and post-prandial blood glucose readings, individual ideal Hgb A1c goals and hypoglycemia prevention. We discussed the importance of good blood sugar control to decrease the likelihood of diabetic complications such as nephropathy, neuropathy, limb loss, blindness, coronary artery disease, and death. We discussed the importance of intensive lifestyle modification including diet, exercise and weight loss as the first line treatment for diabetes. Cayli agrees to continue her diabetes medications and we will refill glucose strips #60 for 1 month. Lavida agrees to follow up with our clinic in 3 weeks.  Obesity Evella is currently in the action stage of change. As such, her goal is to continue with weight loss efforts She has agreed to follow the Category 2 plan Emma has been instructed to work up to a goal of 150 minutes of combined cardio and strengthening exercise per week for weight loss and overall health benefits. We discussed the following Behavioral Modification  Strategies today: work on meal planning and easy cooking plans and no skipping meals   Koren has agreed to follow up with our clinic in 3 weeks. She was informed of the importance of frequent follow up visits to maximize her success with intensive lifestyle modifications for her multiple health conditions.   OBESITY BEHAVIORAL INTERVENTION VISIT  Today's visit was # 11  Starting weight: 183 lbs Starting date: 01/20/18 Today's weight : 170 lbs Today's date: 09/03/2018 Total lbs lost to date: 13 At least 15 minutes were spent on discussing the following behavioral intervention visit.   ASK: We discussed the diagnosis of obesity with [name] today and Marlia agreed to give [name] permission to discuss obesity behavioral modification therapy today.  ASSESS: Suzann has the diagnosis of obesity and her BMI today is 33.2 Latrelle is in the action stage of change   ADVISE: Lariya was educated on the multiple health risks of obesity as well as the benefit of weight loss to improve her health. She was advised of the need for long term treatment and the importance of lifestyle modifications.  AGREE: Multiple dietary modification options and treatment options were discussed and  Maneh agreed to the above obesity treatment plan.  Wilhemena Durie, am acting as transcriptionist for Abby Potash, PA-C I, Abby Potash, PA-C have reviewed above note and agree with its content

## 2018-09-05 ENCOUNTER — Other Ambulatory Visit (INDEPENDENT_AMBULATORY_CARE_PROVIDER_SITE_OTHER): Payer: Self-pay | Admitting: Physician Assistant

## 2018-09-05 DIAGNOSIS — E119 Type 2 diabetes mellitus without complications: Secondary | ICD-10-CM

## 2018-09-05 DIAGNOSIS — E559 Vitamin D deficiency, unspecified: Secondary | ICD-10-CM

## 2018-09-24 ENCOUNTER — Ambulatory Visit (INDEPENDENT_AMBULATORY_CARE_PROVIDER_SITE_OTHER): Payer: Medicare Other | Admitting: Physician Assistant

## 2018-09-24 ENCOUNTER — Encounter (INDEPENDENT_AMBULATORY_CARE_PROVIDER_SITE_OTHER): Payer: Self-pay | Admitting: Physician Assistant

## 2018-09-24 VITALS — BP 114/68 | HR 77 | Temp 97.6°F | Ht 60.0 in | Wt 170.0 lb

## 2018-09-24 DIAGNOSIS — E119 Type 2 diabetes mellitus without complications: Secondary | ICD-10-CM

## 2018-09-24 DIAGNOSIS — E559 Vitamin D deficiency, unspecified: Secondary | ICD-10-CM

## 2018-09-24 DIAGNOSIS — E669 Obesity, unspecified: Secondary | ICD-10-CM

## 2018-09-24 DIAGNOSIS — Z6833 Body mass index (BMI) 33.0-33.9, adult: Secondary | ICD-10-CM | POA: Diagnosis not present

## 2018-09-24 MED ORDER — ACCU-CHEK AVIVA DEVI: 0 refills | Status: AC

## 2018-09-27 NOTE — Progress Notes (Signed)
Office: [phone]  /  Fax: [phone]   HPI:   Chief Complaint: OBESITY Judy [name] is here to discuss her progress with her obesity treatment plan. She is on the Category 2 plan and is following her eating plan approximately 80 % of the time. She states she is exercising 0 minutes 0 times per week. [name] reports that she continues to get her protein in at meals. She is asking for ways to make meals more interesting and asking for different protein choices, as she is tired of chicken.  Her weight is 170 lb (77.1 kg) today and has not lost weight since her last visit. She has lost 13 lbs since starting treatment with [name].  Diabetes II Judy [name] has a diagnosis of diabetes type II. [name] states she is not checking BGs due to no glucometer. She is on metformin and denies nausea, vomiting, or diarrhea. She denies hypoglycemia.  Last A1c was 6.4. She has been working on intensive lifestyle modifications including diet, exercise, and weight loss to help control her blood glucose levels.  Vitamin D Deficiency Judy [name] has a diagnosis of vitamin D deficiency. She is currently taking prescription Vit D and denies nausea, vomiting or muscle weakness.  ALLERGIES: Allergies  Allergen Reactions  . No Known Allergies     MEDICATIONS: Current Outpatient Medications on File Prior to Visit  Medication Sig Dispense Refill  . clorazepate (TRANXENE) 3.75 MG tablet Take 3.75 mg by mouth at bedtime.     Judy [name] diltiazem (CARDIZEM CD) 180 MG 24 hr capsule Take 180 mg by mouth daily. Hold for HR <55 or >105    . glucose blood (ACCU-CHEK AVIVA) test strip Test twice daily 60 each 0  . imipramine (TOFRANIL) 50 MG tablet Take 50 mg by mouth at bedtime.    . Lancets (ACCU-CHEK SAFE-T PRO) lancets Test twice daily 60 each 0  . metFORMIN (GLUCOPHAGE) 500 MG tablet Take 1 tablet (500 mg total) by mouth 3 (three) times daily. (Patient taking differently: Take 500 mg by mouth 3 (three) times daily. 1/2 tab Qam and 1 tab QHS) 90  tablet 0  . NON FORMULARY 1 each by Other route See admin instructions. CPAP machine nightly    . pantoprazole (PROTONIX) 40 MG tablet Take 40 mg by mouth daily.    Judy [name] spironolactone (ALDACTONE) 25 MG tablet Take 25 mg by mouth daily.    . Vitamin D, Ergocalciferol, (DRISDOL) 50000 units CAPS capsule Take 1 capsule (50,000 Units total) by mouth every 7 (seven) days. 4 capsule 0   No current facility-administered medications on file prior to visit.     PAST MEDICAL HISTORY: Past Medical History:  Diagnosis Date  . Anxiety   . Back pain   . Complication of anesthesia 2003   gallbladder surgery-resp. arrest-stop operation and pt. went to ICU  . Depression   . Diabetes ([place])   . Dyspnea   . Gout   . Hypertension   . Leg edema   . Pancreatitis, chronic ([place]) 03/2016  . Sleep apnea     PAST SURGICAL HISTORY: Past Surgical History:  Procedure Laterality Date  . CHOLECYSTECTOMY    . COLONOSCOPY  2013    SOCIAL HISTORY: Social History   Tobacco Use  . Smoking status: Never Smoker  . Smokeless tobacco: Never Used  Substance Use Topics  . Alcohol use: No    Alcohol/week: 0.0 standard drinks  . Drug use: No    FAMILY HISTORY: Family History  Problem Relation Age of  Onset  . Dementia Mother   . AAA (abdominal aortic aneurysm) Father   . Breast cancer Maternal Aunt     ROS: Review of Systems  Constitutional: Negative for weight loss.  Gastrointestinal: Negative for diarrhea, nausea and vomiting.  Musculoskeletal:       Negative muscle weakness  Endo/Heme/Allergies:       Negative hypoglycemia    PHYSICAL EXAM: Blood pressure 114/68, pulse 77, temperature 97.6 F (36.4 C), temperature source Oral, height 5' (1.524 m), weight 170 lb (77.1 kg), SpO2 98 %. Body mass index is 33.2 kg/m. Physical Exam  Constitutional: She is oriented to person, place, and time. She appears well-developed and well-nourished.  Cardiovascular: Normal rate.  Pulmonary/Chest: Effort  normal.  Musculoskeletal: Normal range of motion.  Neurological: She is oriented to person, place, and time.  Skin: Skin is warm and dry.  Psychiatric: She has a normal mood and affect. Her behavior is normal.  Vitals reviewed.   RECENT LABS AND TESTS: BMET    Component Value Date/Time   NA 138 08/20/2018   K 5.4 (A) 08/20/2018   CL 101 06/19/2018 0958   CO2 21 06/19/2018 0958   GLUCOSE 117 (H) 06/19/2018 0958   GLUCOSE 111 (H) 11/12/2016 1334   BUN 31 (A) 08/20/2018   CREATININE 1.4 (A) 08/20/2018   CREATININE 1.34 (H) 06/19/2018 0958   CALCIUM 10.1 06/19/2018 0958   GFRNONAA 39 (L) 06/19/2018 0958   GFRAA 45 (L) 06/19/2018 0958   Lab Results  Component Value Date   HGBA1C 6.4 (A) 08/20/2018   HGBA1C 6.9 (H) 06/19/2018   HGBA1C 7.3 (H) 01/21/2018   HGBA1C 6.2 (H) 03/22/2016   Lab Results  Component Value Date   INSULIN 26.1 (H) 06/19/2018   INSULIN 44.8 (H) 01/21/2018   CBC    Component Value Date/Time   WBC 9.3 08/20/2018   WBC 10.8 (H) 11/12/2016 1334   RBC 4.58 06/19/2018 0958   RBC 4.36 11/12/2016 1334   HGB 15.0 08/20/2018   HGB 13.8 06/19/2018 0958   HCT 43 08/20/2018   HCT 42.5 06/19/2018 0958   PLT 298 08/20/2018   MCV 93 06/19/2018 0958   MCH 30.1 06/19/2018 0958   MCH 30.7 11/12/2016 1334   MCHC 32.5 06/19/2018 0958   MCHC 33.1 11/12/2016 1334   RDW 13.8 06/19/2018 0958   LYMPHSABS 2.4 06/19/2018 0958   MONOABS 0.6 03/16/2016 2347   EOSABS 0.1 06/19/2018 0958   BASOSABS 0.0 06/19/2018 0958   Iron/TIBC/Ferritin/ %Sat No results found for: IRON, TIBC, FERRITIN, IRONPCTSAT Lipid Panel     Component Value Date/Time   CHOL 181 08/20/2018   CHOL 184 06/19/2018 0958   TRIG 151 08/20/2018   HDL 45 08/20/2018   HDL 45 06/19/2018 0958   LDLCALC 30 08/20/2018   LDLCALC 107 (H) 06/19/2018 0958   Hepatic Function Panel     Component Value Date/Time   PROT 6.7 06/19/2018 0958   ALBUMIN 4.1 06/19/2018 0958   AST 15 08/20/2018   ALT 18  08/20/2018   ALKPHOS 100 06/19/2018 0958   BILITOT 0.3 06/19/2018 0958      Component Value Date/Time   TSH 1.950 01/21/2018 0859  Results for Judy [name] (MRN [id number]) as of 09/27/2018 19:22  Ref. Range 06/19/2018 09:58  Vitamin D, 25-Hydroxy Latest Ref Range: 30.0 - 100.0 ng/mL 24.9 (L)    ASSESSMENT AND PLAN: Type 2 diabetes mellitus without complication, without long-term current use of insulin ([place]) - Plan: Blood Glucose Monitoring  Suppl (ACCU-CHEK AVIVA) device  Vitamin D deficiency  Class 1 obesity with serious comorbidity and body mass index (BMI) of 33.0 to 33.9 in adult, unspecified obesity type  PLAN:  Diabetes II Cynthya has been given extensive diabetes education by myself today including ideal fasting and post-prandial blood glucose readings, individual ideal Hgb A1c goals and hypoglycemia prevention. We discussed the importance of good blood sugar control to decrease the likelihood of diabetic complications such as nephropathy, neuropathy, limb loss, blindness, coronary artery disease, and death. We discussed the importance of intensive lifestyle modification including diet, exercise and weight loss as the first line treatment for diabetes. Maylin agrees to continue her diabetes medications and we will send a Accucheck glucometer #1 to the pharmacy. Hermelinda agrees to follow up with our clinic in 3 weeks.  Vitamin D Deficiency Jesenya was informed that low vitamin D levels contributes to fatigue and are associated with obesity, breast, and colon cancer. Mylynn agrees to continue taking prescription Vit D @50 ,000 IU every week and will follow up for routine testing of vitamin D, at least 2-3 times per year. She was informed of the risk of over-replacement of vitamin D and agrees to not increase her dose unless she discusses this with [name] first. Christeena agrees to follow up with our clinic in 3 weeks.  Obesity Janique is currently in the action stage of change. As such, her goal is to  continue with weight loss efforts She has agreed to follow the Category 2 plan Ruthell has been instructed to work up to a goal of 150 minutes of combined cardio and strengthening exercise per week for weight loss and overall health benefits. We discussed the following Behavioral Modification Strategies today: increasing lean protein intake and work on meal planning and easy cooking plans   Zilah has agreed to follow up with our clinic in 3 weeks. She was informed of the importance of frequent follow up visits to maximize her success with intensive lifestyle modifications for her multiple health conditions.   OBESITY BEHAVIORAL INTERVENTION VISIT  Today's visit was # 12   Starting weight: 183 lbs Starting date: 01/20/18 Today's weight : 170 lbs Today's date: 09/24/2018 Total lbs lost to date: 13 At least 15 minutes were spent on discussing the following behavioral intervention visit.   ASK: We discussed the diagnosis of obesity with [name] today and Faustine agreed to give [name] permission to discuss obesity behavioral modification therapy today.  ASSESS: Devanee has the diagnosis of obesity and her BMI today is 33.2 Yocelin is in the action stage of change   ADVISE: Odetta was educated on the multiple health risks of obesity as well as the benefit of weight loss to improve her health. She was advised of the need for long term treatment and the importance of lifestyle modifications.  AGREE: Multiple dietary modification options and treatment options were discussed and  Hydia agreed to the above obesity treatment plan.  Wilhemena Durie, am acting as transcriptionist for Abby Potash, PA-C I, Abby Potash, PA-C have reviewed above note and agree with its content

## 2018-10-15 ENCOUNTER — Ambulatory Visit (INDEPENDENT_AMBULATORY_CARE_PROVIDER_SITE_OTHER): Payer: Medicare Other | Admitting: Physician Assistant

## 2018-10-15 ENCOUNTER — Encounter (INDEPENDENT_AMBULATORY_CARE_PROVIDER_SITE_OTHER): Payer: Self-pay

## 2018-10-16 ENCOUNTER — Other Ambulatory Visit (INDEPENDENT_AMBULATORY_CARE_PROVIDER_SITE_OTHER): Payer: Self-pay | Admitting: Physician Assistant

## 2018-10-16 DIAGNOSIS — E119 Type 2 diabetes mellitus without complications: Secondary | ICD-10-CM

## 2018-10-16 DIAGNOSIS — E559 Vitamin D deficiency, unspecified: Secondary | ICD-10-CM

## 2018-10-17 ENCOUNTER — Other Ambulatory Visit (INDEPENDENT_AMBULATORY_CARE_PROVIDER_SITE_OTHER): Payer: Self-pay | Admitting: Family Medicine

## 2018-10-17 DIAGNOSIS — E119 Type 2 diabetes mellitus without complications: Secondary | ICD-10-CM

## 2018-10-22 ENCOUNTER — Ambulatory Visit (INDEPENDENT_AMBULATORY_CARE_PROVIDER_SITE_OTHER): Payer: Medicare Other | Admitting: Family Medicine

## 2018-10-22 VITALS — BP 127/70 | HR 74 | Temp 97.8°F | Ht 60.0 in | Wt 169.0 lb

## 2018-10-22 DIAGNOSIS — N183 Chronic kidney disease, stage 3 unspecified: Secondary | ICD-10-CM

## 2018-10-22 DIAGNOSIS — E559 Vitamin D deficiency, unspecified: Secondary | ICD-10-CM | POA: Diagnosis not present

## 2018-10-22 DIAGNOSIS — E1122 Type 2 diabetes mellitus with diabetic chronic kidney disease: Secondary | ICD-10-CM | POA: Diagnosis not present

## 2018-10-22 DIAGNOSIS — E669 Obesity, unspecified: Secondary | ICD-10-CM

## 2018-10-22 DIAGNOSIS — Z6833 Body mass index (BMI) 33.0-33.9, adult: Secondary | ICD-10-CM

## 2018-10-22 MED ORDER — GLUCOSE BLOOD VI STRP: ORAL_STRIP | 0 refills | Status: DC

## 2018-10-22 MED ORDER — VITAMIN D (ERGOCALCIFEROL) 1.25 MG (50000 UNIT) PO CAPS: 50000.0000 [IU] | ORAL_CAPSULE | ORAL | 0 refills | Status: DC

## 2018-10-27 ENCOUNTER — Encounter (INDEPENDENT_AMBULATORY_CARE_PROVIDER_SITE_OTHER): Payer: Self-pay | Admitting: Family Medicine

## 2018-10-27 NOTE — Progress Notes (Signed)
Office: [phone]  /  Fax: [phone]   HPI:   Chief Complaint: OBESITY Judy [name] is here to discuss her progress with her obesity treatment plan. She is on the Category 2 plan and is following her eating plan approximately 80% of the time. She states she is exercising 0 minutes 0 times per week. Takhia struggles with getting meat in and she exchanges 2 ounces of meat for greek yogurt. She is eating out for dinner 3 times per week.  Her weight is 169 lb (76.7 kg) today and has had a weight loss of 1 pound over a period of 4 weeks since her last visit. She has lost 14 lbs since starting treatment with [name].  Vitamin D deficiency Judy [name] has a diagnosis of vitamin D deficiency. She is currently stable on vit D, but she is not yet at goal (last vitamin D level was at 24.9 on 06/19/18). Judy [name] denies nausea, vomiting or muscle weakness.  Diabetes II Soma has a diagnosis of diabetes type II. Judy [name] states fasting BGs range between 115 and 120 and 2 hour post prandial BGs range in the 130's. Judy [name] has rare hypoglycemia. Judy [name] is on metformin and she denies nausea, vomiting or diarrhea. Last A1c was at 6.4 She has been working on intensive lifestyle modifications including diet, exercise, and weight loss to help control her blood glucose levels.  ALLERGIES: Allergies  Allergen Reactions  . No Known Allergies     MEDICATIONS: Current Outpatient Medications on File Prior to Visit  Medication Sig Dispense Refill  . Blood Glucose Monitoring Suppl (ACCU-CHEK AVIVA) device Check BS twice daily 1 each 0  . clorazepate (TRANXENE) 3.75 MG tablet Take 3.75 mg by mouth at bedtime.     [name] diltiazem (CARDIZEM CD) 180 MG 24 hr capsule Take 180 mg by mouth daily. Hold for HR <55 or >105    . imipramine (TOFRANIL) 50 MG tablet Take 50 mg by mouth at bedtime.    . Lancets (ACCU-CHEK SAFE-T PRO) lancets Test twice daily 60 each 0  . metFORMIN (GLUCOPHAGE) 500 MG tablet Take 1 tablet (500 mg total) by mouth 3  (three) times daily. (Patient taking differently: Take 500 mg by mouth 3 (three) times daily. 1/2 tab Qam and 1 tab QHS) 90 tablet 0  . NON FORMULARY 1 each by Other route See admin instructions. CPAP machine nightly    . pantoprazole (PROTONIX) 40 MG tablet Take 40 mg by mouth daily.    [name] spironolactone (ALDACTONE) 25 MG tablet Take 25 mg by mouth daily.     No current facility-administered medications on file prior to visit.     PAST MEDICAL HISTORY: Past Medical History:  Diagnosis Date  . Anxiety   . Back pain   . Complication of anesthesia 2003   gallbladder surgery-resp. arrest-stop operation and pt. went to ICU  . Depression   . Diabetes ([place])   . Dyspnea   . Gout   . Hypertension   . Leg edema   . Pancreatitis, chronic ([place]) 03/2016  . Sleep apnea     PAST SURGICAL HISTORY: Past Surgical History:  Procedure Laterality Date  . CHOLECYSTECTOMY    . COLONOSCOPY  2013    SOCIAL HISTORY: Social History   Tobacco Use  . Smoking status: Never Smoker  . Smokeless tobacco: Never Used  Substance Use Topics  . Alcohol use: No    Alcohol/week: 0.0 standard drinks  . Drug use: No    FAMILY HISTORY: Family History  Problem Relation Age of Onset  . Dementia Mother   . AAA (abdominal aortic aneurysm) Father   . Breast cancer Maternal Aunt     ROS: Review of Systems  Constitutional: Positive for weight loss.  Gastrointestinal: Negative for diarrhea, nausea and vomiting.  Musculoskeletal:       Negative for muscle weakness  Endo/Heme/Allergies:       + hypoglycemia    PHYSICAL EXAM: Blood pressure 127/70, pulse 74, temperature 97.8 F (36.6 C), temperature source Oral, height 5' (1.524 m), weight 169 lb (76.7 kg), SpO2 95 %. Body mass index is 33.01 kg/m. Physical Exam  Constitutional: She is oriented to person, place, and time. She appears well-developed and well-nourished.  Cardiovascular: Normal rate.  Pulmonary/Chest: Effort normal.  Musculoskeletal:  Normal range of motion.  Neurological: She is oriented to person, place, and time.  Skin: Skin is warm and dry.  Psychiatric: She has a normal mood and affect. Her behavior is normal.  Vitals reviewed.   RECENT LABS AND TESTS: BMET    Component Value Date/Time   NA 138 08/20/2018   K 5.4 (A) 08/20/2018   CL 101 06/19/2018 0958   CO2 21 06/19/2018 0958   GLUCOSE 117 (H) 06/19/2018 0958   GLUCOSE 111 (H) 11/12/2016 1334   BUN 31 (A) 08/20/2018   CREATININE 1.4 (A) 08/20/2018   CREATININE 1.34 (H) 06/19/2018 0958   CALCIUM 10.1 06/19/2018 0958   GFRNONAA 39 (L) 06/19/2018 0958   GFRAA 45 (L) 06/19/2018 0958   Lab Results  Component Value Date   HGBA1C 6.4 (A) 08/20/2018   HGBA1C 6.9 (H) 06/19/2018   HGBA1C 7.3 (H) 01/21/2018   HGBA1C 6.2 (H) 03/22/2016   Lab Results  Component Value Date   INSULIN 26.1 (H) 06/19/2018   INSULIN 44.8 (H) 01/21/2018   CBC    Component Value Date/Time   WBC 9.3 08/20/2018   WBC 10.8 (H) 11/12/2016 1334   RBC 4.58 06/19/2018 0958   RBC 4.36 11/12/2016 1334   HGB 15.0 08/20/2018   HGB 13.8 06/19/2018 0958   HCT 43 08/20/2018   HCT 42.5 06/19/2018 0958   PLT 298 08/20/2018   MCV 93 06/19/2018 0958   MCH 30.1 06/19/2018 0958   MCH 30.7 11/12/2016 1334   MCHC 32.5 06/19/2018 0958   MCHC 33.1 11/12/2016 1334   RDW 13.8 06/19/2018 0958   LYMPHSABS 2.4 06/19/2018 0958   MONOABS 0.6 03/16/2016 2347   EOSABS 0.1 06/19/2018 0958   BASOSABS 0.0 06/19/2018 0958   Iron/TIBC/Ferritin/ %Sat No results found for: IRON, TIBC, FERRITIN, IRONPCTSAT Lipid Panel     Component Value Date/Time   CHOL 181 08/20/2018   CHOL 184 06/19/2018 0958   TRIG 151 08/20/2018   HDL 45 08/20/2018   HDL 45 06/19/2018 0958   LDLCALC 30 08/20/2018   LDLCALC 107 (H) 06/19/2018 0958   Hepatic Function Panel     Component Value Date/Time   PROT 6.7 06/19/2018 0958   ALBUMIN 4.1 06/19/2018 0958   AST 15 08/20/2018   ALT 18 08/20/2018   ALKPHOS 100  06/19/2018 0958   BILITOT 0.3 06/19/2018 0958      Component Value Date/Time   TSH 1.950 01/21/2018 0859   Results for Judy [name] (MRN [id number]) as of 10/27/2018 15:52  Ref. Range 06/19/2018 09:58  Vitamin D, 25-Hydroxy Latest Ref Range: 30.0 - 100.0 ng/mL 24.9 (L)   ASSESSMENT AND PLAN: Vitamin D deficiency - Plan: Vitamin D, Ergocalciferol, (DRISDOL) 1.25 MG (50000 UT)  CAPS capsule  Type 2 diabetes mellitus without complication, without long-term current use of insulin (HCC) - Plan: glucose blood (ACCU-CHEK AVIVA) test strip  Class 1 obesity with serious comorbidity and body mass index (BMI) of 33.0 to 33.9 in adult, unspecified obesity type  PLAN:  Vitamin D Deficiency Nakai was informed that low vitamin D levels contributes to fatigue and are associated with obesity, breast, and colon cancer. She agrees to continue to take prescription Vit D @50 ,000 IU every week #4 with no refills and will follow up for routine testing of vitamin D, at least 2-3 times per year. She was informed of the risk of over-replacement of vitamin D and agrees to not increase her dose unless she discusses this with [name] first. We will check vitamin D level in approximately 1 month when other labs are due. Aivy agrees to follow up as directed.  Diabetes II Briseyda has been given extensive diabetes education by myself today including ideal fasting and post-prandial blood glucose readings, individual ideal Hgb A1c goals and hypoglycemia prevention. We discussed the importance of good blood sugar control to decrease the likelihood of diabetic complications such as nephropathy, neuropathy, limb loss, blindness, coronary artery disease, and death. We discussed the importance of intensive lifestyle modification including diet, exercise and weight loss as the first line treatment for diabetes. Omara agrees to continue her diabetes medications and prescription was written for test strips today. Thomasina agrees to  follow up  at the agreed upon time.  Obesity Grayce is currently in the action stage of change. As such, her goal is to continue with weight loss efforts She has agreed to follow the Category 2 plan Tika has been instructed to do stretching and back exercises twice weekly for weight loss and overall health benefits. We discussed the following Behavioral Modification Strategies today: planning for success, increasing lean protein intake and holiday eating strategies   We discussed exchanges for 2 ounces of meat today.  Brad has agreed to follow up with our clinic in 3 weeks. She was informed of the importance of frequent follow up visits to maximize her success with intensive lifestyle modifications for her multiple health conditions.   OBESITY BEHAVIORAL INTERVENTION VISIT  Today's visit was # 13  Starting weight: 183 lbs Starting date: 01/20/2018 Today's weight : 169 lbs Today's date: 10/22/2018 Total lbs lost to date: 14 At least 15 minutes were spent on discussing the following behavioral intervention visit.   ASK: We discussed the diagnosis of obesity with [name] today and Shawneequa agreed to give [name] permission to discuss obesity behavioral modification therapy today.  ASSESS: Analysia has the diagnosis of obesity and her BMI today is 33.01 Leyton is in the action stage of change   ADVISE: Amika was educated on the multiple health risks of obesity as well as the benefit of weight loss to improve her health. She was advised of the need for long term treatment and the importance of lifestyle modifications to improve her current health and to decrease her risk of future health problems.  AGREE: Multiple dietary modification options and treatment options were discussed and  Kareena agreed to follow the recommendations documented in the above note.  ARRANGE: Alexah was educated on the importance of frequent visits to treat obesity as outlined per CMS and USPSTF guidelines and agreed to  schedule her next follow up appointment today.  Corey Skains, am acting as Location manager for [employer], FNP-C.  I have reviewed the above documentation  for accuracy and completeness, and I agree with the above.  - Despina Boan, FNP-C.

## 2018-11-12 ENCOUNTER — Ambulatory Visit (INDEPENDENT_AMBULATORY_CARE_PROVIDER_SITE_OTHER): Payer: Medicare Other | Admitting: Family Medicine

## 2018-11-12 ENCOUNTER — Encounter (INDEPENDENT_AMBULATORY_CARE_PROVIDER_SITE_OTHER): Payer: Self-pay | Admitting: Family Medicine

## 2018-11-12 VITALS — BP 132/72 | HR 87 | Temp 98.2°F | Ht 60.0 in | Wt 168.0 lb

## 2018-11-12 DIAGNOSIS — E1122 Type 2 diabetes mellitus with diabetic chronic kidney disease: Secondary | ICD-10-CM

## 2018-11-12 DIAGNOSIS — Z6832 Body mass index (BMI) 32.0-32.9, adult: Secondary | ICD-10-CM

## 2018-11-12 DIAGNOSIS — E669 Obesity, unspecified: Secondary | ICD-10-CM | POA: Diagnosis not present

## 2018-11-12 DIAGNOSIS — N183 Chronic kidney disease, stage 3 unspecified: Secondary | ICD-10-CM

## 2018-11-12 NOTE — Progress Notes (Signed)
Office: [phone]  /  Fax: [phone]   HPI:   Chief Complaint: OBESITY Judy [name] is here to discuss her progress with her obesity treatment plan. Judy [name] is on the Category 2 plan and is following her eating plan approximately 85% of the time. Judy [name] states Judy [name] is exercising 0 minutes 0 times per week. Judy [name] made good choices over Thanksgiving. Her weight is slowed and Judy [name] is not getting all of the protein in. Judy [name] is bored with meat options. Shrimp and pork cause gout flares.  Her weight is 168 lb (76.2 kg) today and has had a weight loss of 1 pounds over a period of 3 weeks since her last visit. Judy [name] has lost 15 lbs since starting treatment with [name].  Diabetes II Judy [name] has a diagnosis of diabetes type II. Judy [name] states fasting BGs range in the 120's and post prandial range between 114 and 135. DM is well-controlled on metformin. Judy [name] notes rare hypoglycemia and denies nausea, vomiting, or diarrhea. Last A1c was 6.4. Judy [name] has been working on intensive lifestyle modifications including diet, exercise, and weight loss to help control her blood glucose levels.  ALLERGIES: Allergies  Allergen Reactions  . No Known Allergies     MEDICATIONS: Current Outpatient Medications on File Prior to Visit  Medication Sig Dispense Refill  . Blood Glucose Monitoring Suppl (ACCU-CHEK AVIVA) device Check BS twice daily 1 each 0  . clorazepate (TRANXENE) 3.75 MG tablet Take 3.75 mg by mouth at bedtime.     [name] diltiazem (CARDIZEM CD) 180 MG 24 hr capsule Take 180 mg by mouth daily. Hold for HR <55 or >105    . glucose blood (ACCU-CHEK AVIVA) test strip Test twice daily 60 each 0  . imipramine (TOFRANIL) 50 MG tablet Take 50 mg by mouth at bedtime.    . Lancets (ACCU-CHEK SAFE-T PRO) lancets Test twice daily 60 each 0  . metFORMIN (GLUCOPHAGE) 500 MG tablet Take 1 tablet (500 mg total) by mouth 3 (three) times daily. (Patient taking differently: Take 500 mg by mouth 3 (three) times daily. 1/2 tab Qam and 1 tab QHS) 90  tablet 0  . NON FORMULARY 1 each by Other route See admin instructions. CPAP machine nightly    . pantoprazole (PROTONIX) 40 MG tablet Take 40 mg by mouth daily.    [name] spironolactone (ALDACTONE) 25 MG tablet Take 25 mg by mouth daily.    . Vitamin D, Ergocalciferol, (DRISDOL) 1.25 MG (50000 UT) CAPS capsule Take 1 capsule (50,000 Units total) by mouth every 7 (seven) days. 4 capsule 0   No current facility-administered medications on file prior to visit.     PAST MEDICAL HISTORY: Past Medical History:  Diagnosis Date  . Anxiety   . Back pain   . Complication of anesthesia 2003   gallbladder surgery-resp. arrest-stop operation and pt. went to ICU  . Depression   . Diabetes ([place])   . Dyspnea   . Gout   . Hypertension   . Leg edema   . Pancreatitis, chronic ([place]) 03/2016  . Sleep apnea     PAST SURGICAL HISTORY: Past Surgical History:  Procedure Laterality Date  . CHOLECYSTECTOMY    . COLONOSCOPY  2013    SOCIAL HISTORY: Social History   Tobacco Use  . Smoking status: Never Smoker  . Smokeless tobacco: Never Used  Substance Use Topics  . Alcohol use: No    Alcohol/week: 0.0 standard drinks  . Drug use: No    FAMILY HISTORY: Family History  Problem Relation Age of Onset  . Dementia Mother   . AAA (abdominal aortic aneurysm) Father   . Breast cancer Maternal Aunt     ROS: Review of Systems  Constitutional: Positive for weight loss.  Gastrointestinal: Negative for diarrhea, nausea and vomiting.  Endo/Heme/Allergies:       Negative hypoglycemia    PHYSICAL EXAM: Blood pressure 132/72, pulse 87, temperature 98.2 F (36.8 C), temperature source Oral, height 5' (1.524 m), weight 168 lb (76.2 kg), SpO2 98 %. Body mass index is 32.81 kg/m. Physical Exam  Constitutional: Judy [name] is oriented to person, place, and time. Judy [name] appears well-developed and well-nourished.  Cardiovascular: Normal rate.  Pulmonary/Chest: Effort normal.  Musculoskeletal: Normal range of  motion.  Neurological: Judy [name] is oriented to person, place, and time.  Skin: Skin is warm and dry.  Psychiatric: Judy [name] has a normal mood and affect. Her behavior is normal.  Vitals reviewed.   RECENT LABS AND TESTS: BMET    Component Value Date/Time   NA 138 08/20/2018   K 5.4 (A) 08/20/2018   CL 101 06/19/2018 0958   CO2 21 06/19/2018 0958   GLUCOSE 117 (H) 06/19/2018 0958   GLUCOSE 111 (H) 11/12/2016 1334   BUN 31 (A) 08/20/2018   CREATININE 1.4 (A) 08/20/2018   CREATININE 1.34 (H) 06/19/2018 0958   CALCIUM 10.1 06/19/2018 0958   GFRNONAA 39 (L) 06/19/2018 0958   GFRAA 45 (L) 06/19/2018 0958   Lab Results  Component Value Date   HGBA1C 6.4 (A) 08/20/2018   HGBA1C 6.9 (H) 06/19/2018   HGBA1C 7.3 (H) 01/21/2018   HGBA1C 6.2 (H) 03/22/2016   Lab Results  Component Value Date   INSULIN 26.1 (H) 06/19/2018   INSULIN 44.8 (H) 01/21/2018   CBC    Component Value Date/Time   WBC 9.3 08/20/2018   WBC 10.8 (H) 11/12/2016 1334   RBC 4.58 06/19/2018 0958   RBC 4.36 11/12/2016 1334   HGB 15.0 08/20/2018   HGB 13.8 06/19/2018 0958   HCT 43 08/20/2018   HCT 42.5 06/19/2018 0958   PLT 298 08/20/2018   MCV 93 06/19/2018 0958   MCH 30.1 06/19/2018 0958   MCH 30.7 11/12/2016 1334   MCHC 32.5 06/19/2018 0958   MCHC 33.1 11/12/2016 1334   RDW 13.8 06/19/2018 0958   LYMPHSABS 2.4 06/19/2018 0958   MONOABS 0.6 03/16/2016 2347   EOSABS 0.1 06/19/2018 0958   BASOSABS 0.0 06/19/2018 0958   Iron/TIBC/Ferritin/ %Sat No results found for: IRON, TIBC, FERRITIN, IRONPCTSAT Lipid Panel     Component Value Date/Time   CHOL 181 08/20/2018   CHOL 184 06/19/2018 0958   TRIG 151 08/20/2018   HDL 45 08/20/2018   HDL 45 06/19/2018 0958   LDLCALC 30 08/20/2018   LDLCALC 107 (H) 06/19/2018 0958   Hepatic Function Panel     Component Value Date/Time   PROT 6.7 06/19/2018 0958   ALBUMIN 4.1 06/19/2018 0958   AST 15 08/20/2018   ALT 18 08/20/2018   ALKPHOS 100 06/19/2018 0958    BILITOT 0.3 06/19/2018 0958      Component Value Date/Time   TSH 1.950 01/21/2018 0859    ASSESSMENT AND PLAN: Type 2 diabetes mellitus with stage 3 chronic kidney disease, without long-term current use of insulin (HCC)  Class 1 obesity with serious comorbidity and body mass index (BMI) of 32.0 to 32.9 in adult, unspecified obesity type  PLAN:  Diabetes II Judy [name] has been given extensive diabetes education by myself today including ideal  fasting and post-prandial blood glucose readings, individual ideal Hgb A1c goals  and hypoglycemia prevention. We discussed the importance of good blood sugar control to decrease the likelihood of diabetic complications such as nephropathy, neuropathy, limb loss, blindness, coronary artery disease, and death. We discussed the importance of intensive lifestyle modification including diet, exercise and weight loss as the first line treatment for diabetes. Khristie agrees to continue taking metformin, continue diet, and we will recheck A1c at next visit. Judy [name] agrees to follow up with our clinic in 3 weeks.  Obesity Judy [name] is currently in the action stage of change. As such, her goal is to continue with weight loss efforts Judy [name] has agreed to change to follow the Category 1 plan with breakfast options Judy [name] has not been prescribed exercise at this time. We discussed the following Behavioral Modification Strategies today: increasing lean protein intake, increase H20 intake, holiday eating strategies, and planning for success  Judy [name] will drop back to Category 1 plan and add breakfast options. We discussed meat options and" protein options" handout was provided.  Judy [name] has agreed to follow up with our clinic in 3 weeks. Judy [name] was informed of the importance of frequent follow up visits to maximize her success with intensive lifestyle modifications for her multiple health conditions.   OBESITY BEHAVIORAL INTERVENTION VISIT  Today's visit was # 14  Starting weight: 183  lbs Starting date: 01/20/18 Today's weight : 168 lbs Today's date: 11/12/2018 Total lbs lost to date: 15 At least 15 minutes were spent on discussing the following behavioral intervention visit.   ASK: We discussed the diagnosis of obesity with Judy [name] today and Judy [name] agreed to give [name] permission to discuss obesity behavioral modification therapy today.  ASSESS: Judy [name] has the diagnosis of obesity and her BMI today is 32.81 Judy [name] is in the action stage of change   ADVISE: Judy [name] was educated on the multiple health risks of obesity as well as the benefit of weight loss to improve her health. Judy [name] was advised of the need for long term treatment and the importance of lifestyle modifications to improve her current health and to decrease her risk of future health problems.  AGREE: Multiple dietary modification options and treatment options were discussed and  Judy [name] agreed to follow the recommendations documented in the above note.  ARRANGE: Judy [name] was educated on the importance of frequent visits to treat obesity as outlined per CMS and USPSTF guidelines and agreed to schedule her next follow up appointment today.  Wilhemena Durie, am acting as Location manager for [employer], FNP-C.  I have reviewed the above documentation for accuracy and completeness, and I agree with the above.  - Rhyland Hinderliter, FNP-C.

## 2018-12-09 ENCOUNTER — Ambulatory Visit (INDEPENDENT_AMBULATORY_CARE_PROVIDER_SITE_OTHER): Payer: Medicare Other | Admitting: Family Medicine

## 2018-12-09 ENCOUNTER — Encounter (INDEPENDENT_AMBULATORY_CARE_PROVIDER_SITE_OTHER): Payer: Self-pay

## 2018-12-18 ENCOUNTER — Ambulatory Visit (INDEPENDENT_AMBULATORY_CARE_PROVIDER_SITE_OTHER): Payer: Medicare Other | Admitting: Family Medicine

## 2018-12-18 ENCOUNTER — Encounter (INDEPENDENT_AMBULATORY_CARE_PROVIDER_SITE_OTHER): Payer: Self-pay

## 2018-12-18 ENCOUNTER — Other Ambulatory Visit (INDEPENDENT_AMBULATORY_CARE_PROVIDER_SITE_OTHER): Payer: Self-pay | Admitting: Family Medicine

## 2018-12-18 DIAGNOSIS — E1122 Type 2 diabetes mellitus with diabetic chronic kidney disease: Secondary | ICD-10-CM

## 2018-12-18 DIAGNOSIS — E559 Vitamin D deficiency, unspecified: Secondary | ICD-10-CM

## 2018-12-18 DIAGNOSIS — N183 Chronic kidney disease, stage 3 (moderate): Principal | ICD-10-CM

## 2018-12-18 MED ORDER — VITAMIN D (ERGOCALCIFEROL) 1.25 MG (50000 UNIT) PO CAPS: 50000.0000 [IU] | ORAL_CAPSULE | ORAL | 0 refills | Status: DC

## 2018-12-18 MED ORDER — GLUCOSE BLOOD VI STRP: ORAL_STRIP | 0 refills | Status: DC

## 2018-12-24 ENCOUNTER — Encounter (INDEPENDENT_AMBULATORY_CARE_PROVIDER_SITE_OTHER): Payer: Self-pay | Admitting: Bariatrics

## 2018-12-24 ENCOUNTER — Ambulatory Visit (INDEPENDENT_AMBULATORY_CARE_PROVIDER_SITE_OTHER): Payer: Medicare Other | Admitting: Bariatrics

## 2018-12-24 VITALS — BP 117/70 | HR 80 | Temp 97.7°F | Ht 60.0 in | Wt 171.0 lb

## 2018-12-24 DIAGNOSIS — E1122 Type 2 diabetes mellitus with diabetic chronic kidney disease: Secondary | ICD-10-CM

## 2018-12-24 DIAGNOSIS — Z6833 Body mass index (BMI) 33.0-33.9, adult: Secondary | ICD-10-CM | POA: Diagnosis not present

## 2018-12-24 DIAGNOSIS — N183 Chronic kidney disease, stage 3 (moderate): Secondary | ICD-10-CM

## 2018-12-24 DIAGNOSIS — I10 Essential (primary) hypertension: Secondary | ICD-10-CM | POA: Diagnosis not present

## 2018-12-24 DIAGNOSIS — E559 Vitamin D deficiency, unspecified: Secondary | ICD-10-CM

## 2018-12-24 DIAGNOSIS — E669 Obesity, unspecified: Secondary | ICD-10-CM

## 2018-12-25 ENCOUNTER — Encounter (INDEPENDENT_AMBULATORY_CARE_PROVIDER_SITE_OTHER): Payer: Self-pay | Admitting: Bariatrics

## 2018-12-25 DIAGNOSIS — Z6833 Body mass index (BMI) 33.0-33.9, adult: Secondary | ICD-10-CM

## 2018-12-25 DIAGNOSIS — E669 Obesity, unspecified: Secondary | ICD-10-CM | POA: Insufficient documentation

## 2018-12-25 LAB — COMPREHENSIVE METABOLIC PANEL
A/G RATIO: 1.8 (ref 1.2–2.2)
ALBUMIN: 4.4 g/dL (ref 3.7–4.7)
ALT: 16 IU/L (ref 0–32)
AST: 13 IU/L (ref 0–40)
Alkaline Phosphatase: 106 IU/L (ref 39–117)
BUN / CREAT RATIO: 19 (ref 12–28)
BUN: 23 mg/dL (ref 8–27)
Bilirubin Total: 0.2 mg/dL (ref 0.0–1.2)
CO2: 22 mmol/L (ref 20–29)
Calcium: 10 mg/dL (ref 8.7–10.3)
Chloride: 100 mmol/L (ref 96–106)
Creatinine, Ser: 1.22 mg/dL — ABNORMAL HIGH (ref 0.57–1.00)
GFR calc non Af Amer: 43 mL/min/{1.73_m2} — ABNORMAL LOW (ref >59)
GFR, EST AFRICAN AMERICAN: 50 mL/min/{1.73_m2} — AB (ref >59)
GLOBULIN, TOTAL: 2.5 g/dL (ref 1.5–4.5)
GLUCOSE: 126 mg/dL — AB (ref 65–99)
Potassium: 4.9 mmol/L (ref 3.5–5.2)
Sodium: 139 mmol/L (ref 134–144)
TOTAL PROTEIN: 6.9 g/dL (ref 6.0–8.5)

## 2018-12-25 LAB — INSULIN, RANDOM: INSULIN: 27.4 u[IU]/mL — ABNORMAL HIGH (ref 2.6–24.9)

## 2018-12-25 LAB — HEMOGLOBIN A1C
Est. average glucose Bld gHb Est-mCnc: 137 mg/dL
Hgb A1c MFr Bld: 6.4 % — ABNORMAL HIGH (ref 4.8–5.6)

## 2018-12-25 LAB — VITAMIN D 25 HYDROXY (VIT D DEFICIENCY, FRACTURES): Vit D, 25-Hydroxy: 31.4 ng/mL (ref 30.0–100.0)

## 2018-12-25 NOTE — Progress Notes (Signed)
Office: [phone]  /  Fax: [phone]   HPI:   Chief Complaint: OBESITY Judy [name] is here to discuss her progress with her obesity treatment plan. She is on the Category 2 plan and is following her eating plan approximately 70 % of the time. She states she is exercising 0 minutes 0 times per week. [name] has been doing well, but gained weight over the holidays since her last visit. She has been sick for 2 1/2 weeks with cough and congestion.   Her weight is 171 lb (77.6 kg) today and has had a weight gain of 3 pounds over a period of 6 weeks since her last visit. She has lost 12 lbs since starting treatment with [name].  Diabetes II [name] has a diagnosis of diabetes type II. [name] states that her fasting BGs range between 104 and 160 and her 2 hour post prandial blood sugars range between 140 and 160's. She is taking metformin and denies any hypoglycemic episodes. Last A1c was 6.4 on 08/20/18. She has been working on intensive lifestyle modifications including diet, exercise, and weight loss to help control her blood glucose levels.  Hypertension [name] is a 78 y.o. female with hypertension. Carletta is taking diltiazem 180mg  and spironolactone 25mg . She is working on weight loss to help control her blood pressure with the goal of decreasing her risk of heart attack and stroke. Shakeria denies lightheadedness.  Vitamin D deficiency [name] has a diagnosis of vitamin D deficiency. She is currently taking vit D and denies nausea, vomiting, or muscle weakness.  ASSESSMENT AND PLAN:  Type 2 diabetes mellitus with stage 3 chronic kidney disease, without long-term current use of insulin ([place]) - Plan: Comprehensive metabolic panel, Hemoglobin A1c, Insulin, random  Essential hypertension - Plan: Comprehensive metabolic panel  Vitamin D deficiency - Plan: VITAMIN D 25 Hydroxy (Vit-D Deficiency, Fractures)  Class 1 obesity with serious comorbidity and body mass index (BMI) of 33.0 to 33.9 in adult,  unspecified obesity type  PLAN:  Diabetes II [name] has been given extensive diabetes education by myself today including ideal fasting and post-prandial blood glucose readings, individual ideal Hgb A1c goals, and hypoglycemia prevention. We discussed the importance of good blood sugar control to decrease the likelihood of diabetic complications such as nephropathy, neuropathy, limb loss, blindness, coronary artery disease, and death. We discussed the importance of intensive lifestyle modification including diet, exercise and weight loss as the first line treatment for diabetes. [name] agrees to continue her metformin and will follow up at the agreed upon time in 2 weeks.  Hypertension We discussed sodium restriction, working on healthy weight loss, and a regular exercise program as the means to achieve improved blood pressure control. We will continue to monitor her blood pressure as well as her progress with the above lifestyle modifications. She will continue her medications as prescribed and will watch for signs of hypotension as she continues her lifestyle modifications. [name] agreed with this plan and agreed to follow up as directed.  Vitamin D Deficiency [name] was informed that low vitamin D levels contributes to fatigue and are associated with obesity, breast, and colon cancer. She agrees to continue to take prescription Vit D @50 ,000 IU every week and will follow up for routine testing of vitamin D, at least 2-3 times per year. She was informed of the risk of over-replacement of vitamin D and agrees to not increase her dose unless she discusses this with [name] first. We will order a vitamin D level today  and [name] agrees to follow up in 2 weeks.  Obesity [name] is currently in the action stage of change. As such, her goal is to continue with weight loss efforts. She has agreed to go back to her Category 2 plan. She will do more meal planning and drink more water. Alfie has been instructed to work  up to a goal of 150 minutes of combined cardio and strengthening exercise per week for weight loss and overall health benefits. We discussed the following Behavioral Modification Strategies today: increasing lean protein intake, decreasing simple carbohydrates, increasing vegetables, increase H2O intake, and keeping healthy foods in the home, and work on meal planning and easy cooking plans.  [name] has agreed to follow up with our clinic in 2 weeks. She was informed of the importance of frequent follow up visits to maximize her success with intensive lifestyle modifications for her multiple health conditions.  ALLERGIES: Allergies  Allergen Reactions  . No Known Allergies     MEDICATIONS: Current Outpatient Medications on File Prior to Visit  Medication Sig Dispense Refill  . Blood Glucose Monitoring Suppl (ACCU-CHEK AVIVA) device Check BS twice daily 1 each 0  . clorazepate (TRANXENE) 3.75 MG tablet Take 3.75 mg by mouth at bedtime.     [name] diltiazem (CARDIZEM CD) 180 MG 24 hr capsule Take 180 mg by mouth daily. Hold for HR <55 or >105    . glucose blood (ACCU-CHEK AVIVA) test strip Test twice daily 60 each 0  . imipramine (TOFRANIL) 50 MG tablet Take 50 mg by mouth at bedtime.    . Lancets (ACCU-CHEK SAFE-T PRO) lancets Test twice daily 60 each 0  . metFORMIN (GLUCOPHAGE) 500 MG tablet Take 1 tablet (500 mg total) by mouth 3 (three) times daily. (Patient taking differently: Take 500 mg by mouth 3 (three) times daily. 1/2 tab Qam and 1 tab QHS) 90 tablet 0  . NON FORMULARY 1 each by Other route See admin instructions. CPAP machine nightly    . pantoprazole (PROTONIX) 40 MG tablet Take 40 mg by mouth daily.    [name] spironolactone (ALDACTONE) 25 MG tablet Take 25 mg by mouth daily.    . Vitamin D, Ergocalciferol, (DRISDOL) 1.25 MG (50000 UT) CAPS capsule Take 1 capsule (50,000 Units total) by mouth every 7 (seven) days. 4 capsule 0   No current facility-administered medications on file prior to  visit.     PAST MEDICAL HISTORY: Past Medical History:  Diagnosis Date  . Anxiety   . Back pain   . Complication of anesthesia 2003   gallbladder surgery-resp. arrest-stop operation and pt. went to ICU  . Depression   . Diabetes ([place])   . Dyspnea   . Gout   . Hypertension   . Leg edema   . Pancreatitis, chronic ([place]) 03/2016  . Sleep apnea     PAST SURGICAL HISTORY: Past Surgical History:  Procedure Laterality Date  . CHOLECYSTECTOMY    . COLONOSCOPY  2013    SOCIAL HISTORY: Social History   Tobacco Use  . Smoking status: Never Smoker  . Smokeless tobacco: Never Used  Substance Use Topics  . Alcohol use: No    Alcohol/week: 0.0 standard drinks  . Drug use: No    FAMILY HISTORY: Family History  Problem Relation Age of Onset  . Dementia Mother   . AAA (abdominal aortic aneurysm) Father   . Breast cancer Maternal Aunt    ROS: Review of Systems  Constitutional: Negative for weight loss.  HENT:  Positive for congestion.   Respiratory: Positive for cough.   Gastrointestinal: Negative for nausea and vomiting.  Musculoskeletal:       Negative for muscle weakness.  Neurological: Negative for headaches.  Endo/Heme/Allergies:       Negative for hypoglycemia.   PHYSICAL EXAM: Blood pressure 117/70, pulse 80, temperature 97.7 F (36.5 C), temperature source Oral, height 5' (1.524 m), weight 171 lb (77.6 kg), SpO2 95 %. Body mass index is 33.4 kg/m. Physical Exam Vitals signs reviewed.  Constitutional:      Appearance: Normal appearance. She is obese.  Cardiovascular:     Rate and Rhythm: Normal rate.  Pulmonary:     Effort: Pulmonary effort is normal.  Musculoskeletal: Normal range of motion.  Skin:    General: Skin is warm and dry.  Neurological:     Mental Status: She is alert and oriented to person, place, and time.  Psychiatric:        Mood and Affect: Mood normal.        Behavior: Behavior normal.    RECENT LABS AND TESTS: BMET    Component  Value Date/Time   NA 139 12/24/2018 0916   K 4.9 12/24/2018 0916   CL 100 12/24/2018 0916   CO2 22 12/24/2018 0916   GLUCOSE 126 (H) 12/24/2018 0916   GLUCOSE 111 (H) 11/12/2016 1334   BUN 23 12/24/2018 0916   CREATININE 1.22 (H) 12/24/2018 0916   CALCIUM 10.0 12/24/2018 0916   GFRNONAA 43 (L) 12/24/2018 0916   GFRAA 50 (L) 12/24/2018 0916   Lab Results  Component Value Date   HGBA1C 6.4 (H) 12/24/2018   HGBA1C 6.4 (A) 08/20/2018   HGBA1C 6.9 (H) 06/19/2018   HGBA1C 7.3 (H) 01/21/2018   HGBA1C 6.2 (H) 03/22/2016   Lab Results  Component Value Date   INSULIN 27.4 (H) 12/24/2018   INSULIN 26.1 (H) 06/19/2018   INSULIN 44.8 (H) 01/21/2018   CBC    Component Value Date/Time   WBC 9.3 08/20/2018   WBC 10.8 (H) 11/12/2016 1334   RBC 4.58 06/19/2018 0958   RBC 4.36 11/12/2016 1334   HGB 15.0 08/20/2018   HGB 13.8 06/19/2018 0958   HCT 43 08/20/2018   HCT 42.5 06/19/2018 0958   PLT 298 08/20/2018   MCV 93 06/19/2018 0958   MCH 30.1 06/19/2018 0958   MCH 30.7 11/12/2016 1334   MCHC 32.5 06/19/2018 0958   MCHC 33.1 11/12/2016 1334   RDW 13.8 06/19/2018 0958   LYMPHSABS 2.4 06/19/2018 0958   MONOABS 0.6 03/16/2016 2347   EOSABS 0.1 06/19/2018 0958   BASOSABS 0.0 06/19/2018 0958   Iron/TIBC/Ferritin/ %Sat No results found for: IRON, TIBC, FERRITIN, IRONPCTSAT Lipid Panel     Component Value Date/Time   CHOL 181 08/20/2018   CHOL 184 06/19/2018 0958   TRIG 151 08/20/2018   HDL 45 08/20/2018   HDL 45 06/19/2018 0958   LDLCALC 30 08/20/2018   LDLCALC 107 (H) 06/19/2018 0958   Hepatic Function Panel     Component Value Date/Time   PROT 6.9 12/24/2018 0916   ALBUMIN 4.4 12/24/2018 0916   AST 13 12/24/2018 0916   ALT 16 12/24/2018 0916   ALKPHOS 106 12/24/2018 0916   BILITOT 0.2 12/24/2018 0916      Component Value Date/Time   TSH 1.950 01/21/2018 0859   Results for VANESA, RENIER (MRN [id number]) as of 12/25/2018 09:23  Ref. Range 12/24/2018 09:16  Vitamin  D, 25-Hydroxy Latest Ref Range: 30.0 - 100.0  ng/mL 31.4   OBESITY BEHAVIORAL INTERVENTION VISIT  Today's visit was # 15   Starting weight: 183 lbs Starting date: 01/20/18 Today's weight : Weight: 171 lb (77.6 kg)  Today's date: 12/24/2018 Total lbs lost to date: 12 At least 15 minutes were spent on discussing the following behavioral intervention visit.  ASK: We discussed the diagnosis of obesity with [name] today and Ebba agreed to give [name] permission to discuss obesity behavioral modification therapy today.  ASSESS: Gracianna has the diagnosis of obesity and her BMI today is 33.4. Tylene is in the action stage of change.   ADVISE: Fayelynn was educated on the multiple health risks of obesity as well as the benefit of weight loss to improve her health. She was advised of the need for long term treatment and the importance of lifestyle modifications to improve her current health and to decrease her risk of future health problems.  AGREE: Multiple dietary modification options and treatment options were discussed and Maddisen agreed to follow the recommendations documented in the above note.  ARRANGE: Shataya was educated on the importance of frequent visits to treat obesity as outlined per CMS and USPSTF guidelines and agreed to schedule her next follow up appointment today.  I, Marcille Blanco, am acting as Location manager for [employer]. Owens Shark, DO  I have reviewed the above documentation for accuracy and completeness, and I agree with the above. -Jearld Lesch, DO

## 2019-01-06 ENCOUNTER — Ambulatory Visit: Payer: Self-pay | Admitting: Internal Medicine

## 2019-01-07 ENCOUNTER — Ambulatory Visit (INDEPENDENT_AMBULATORY_CARE_PROVIDER_SITE_OTHER): Payer: Medicare Other | Admitting: Family Medicine

## 2019-01-07 ENCOUNTER — Encounter (INDEPENDENT_AMBULATORY_CARE_PROVIDER_SITE_OTHER): Payer: Self-pay | Admitting: Family Medicine

## 2019-01-07 VITALS — BP 117/67 | HR 69 | Temp 97.5°F | Ht 60.0 in | Wt 172.0 lb

## 2019-01-07 DIAGNOSIS — E559 Vitamin D deficiency, unspecified: Secondary | ICD-10-CM | POA: Diagnosis not present

## 2019-01-07 DIAGNOSIS — N183 Chronic kidney disease, stage 3 unspecified: Secondary | ICD-10-CM

## 2019-01-07 DIAGNOSIS — Z6833 Body mass index (BMI) 33.0-33.9, adult: Secondary | ICD-10-CM

## 2019-01-07 DIAGNOSIS — E1122 Type 2 diabetes mellitus with diabetic chronic kidney disease: Secondary | ICD-10-CM | POA: Diagnosis not present

## 2019-01-07 DIAGNOSIS — E669 Obesity, unspecified: Secondary | ICD-10-CM

## 2019-01-07 MED ORDER — ACCU-CHEK SAFE-T PRO LANCETS MISC: 0 refills | Status: DC

## 2019-01-08 ENCOUNTER — Encounter (INDEPENDENT_AMBULATORY_CARE_PROVIDER_SITE_OTHER): Payer: Self-pay | Admitting: Family Medicine

## 2019-01-08 NOTE — Progress Notes (Signed)
Office: [phone]  /  Fax: [phone]   HPI:   Chief Complaint: OBESITY Judy [name] is here to discuss her progress with her obesity treatment plan. She is on the Category 2 plan and is following her eating plan approximately 80 % of the time. She states she is exercising 0 minutes 0 times per week. Judy [name] is eating fast food biscuits for breakfast and only eating the meat and the bottom of the biscuit. She eats out several meals per week with her friends.  Her weight is 172 lb (78 kg) today and has had a weight gain of 1 pound over a period of 2 weeks since her last visit. She has lost 11 lbs since starting treatment with [name].  Diabetes II Judy [name] has a diagnosis of diabetes type II. Judy [name] states that her fasting BGs range between 100 to 109 and 2 hour post prandial sugars are 120 to 200. Her last A1c was 6.4 on 12/24/18 and she is well controlled on metformin. She denies any hypoglycemic episodes. She has been working on intensive lifestyle modifications including diet, exercise, and weight loss to help control her blood glucose levels.  Vitamin D deficiency Judy [name] has a diagnosis of vitamin D deficiency. She is currently taking vit D and is not at goal despite 3 months of high dose vitamin D. She denies nausea, vomiting, or muscle weakness.  ASSESSMENT AND PLAN:  Type 2 diabetes mellitus with stage 3 chronic kidney disease, without long-term current use of insulin ([facility]) - Plan: Lancets (ACCU-CHEK SAFE-T PRO) lancets  Vitamin D deficiency  Class 1 obesity with serious comorbidity and body mass index (BMI) of 33.0 to 33.9 in adult, unspecified obesity type  PLAN:  Diabetes II Judy [name] has been given extensive diabetes education by myself today including ideal fasting and post-prandial blood glucose readings, individual ideal Hgb A1c goals, and hypoglycemia prevention. We discussed the importance of good blood sugar control to decrease the likelihood of diabetic complications such as  nephropathy, neuropathy, limb loss, blindness, coronary artery disease, and death. We discussed the importance of intensive lifestyle modification including diet, exercise and weight loss as the first line treatment for diabetes. Judy [name] agrees to continue her diabetes medications and we will refill lancets for BID testing #100 with no refills and she will follow up at the agreed upon time in 3 weeks.  Vitamin D Deficiency Judy [name] was informed that low vitamin D levels contributes to fatigue and are associated with obesity, breast, and colon cancer. She agrees to continue to take prescription Vit D @50 ,000 IU every week and will follow up for routine testing of vitamin D, at least 2-3 times per year. She was informed of the risk of over-replacement of vitamin D and agrees to not increase her dose unless she discusses this with [name] first. Judy [name] will follow up at the agreed upon time.  Obesity Judy [name] is currently in the action stage of change. As such, her goal is to continue with weight loss efforts. She has agreed to follow the Category 2 plan or keep a food journal of 1200 to 1300 calories and 85 grams of protein daily. I demonstrated My Fitness Pal and she was given hand outs on "Dining Out", "Protein Content", and "Journaling". She agreed to have an Egg McMufffin rather than a sausage biscuit for breakfast.  We discussed the following Behavioral Modification Strategies today: increasing lean protein intake, decreasing simple carbohydrates, planning for success, and keeping a strict food journal.   Judy [name] has agreed to follow  up with our clinic in 3 weeks. She was informed of the importance of frequent follow up visits to maximize her success with intensive lifestyle modifications for her multiple health conditions.  ALLERGIES: Allergies  Allergen Reactions  . No Known Allergies     MEDICATIONS: Current Outpatient Medications on File Prior to Visit  Medication Sig Dispense Refill  . Blood Glucose  Monitoring Suppl (ACCU-CHEK AVIVA) device Check BS twice daily 1 each 0  . clorazepate (TRANXENE) 3.75 MG tablet Take 3.75 mg by mouth at bedtime.     [name] diltiazem (CARDIZEM CD) 180 MG 24 hr capsule Take 180 mg by mouth daily. Hold for HR <55 or >105    . glucose blood (ACCU-CHEK AVIVA) test strip Test twice daily 60 each 0  . imipramine (TOFRANIL) 50 MG tablet Take 50 mg by mouth at bedtime.    . metFORMIN (GLUCOPHAGE) 500 MG tablet Take 1 tablet (500 mg total) by mouth 3 (three) times daily. (Patient taking differently: Take 500 mg by mouth 3 (three) times daily. 1/2 tab Qam and 1 tab QHS) 90 tablet 0  . NON FORMULARY 1 each by Other route See admin instructions. CPAP machine nightly    . pantoprazole (PROTONIX) 40 MG tablet Take 40 mg by mouth daily.    [name] spironolactone (ALDACTONE) 25 MG tablet Take 25 mg by mouth daily.    . Vitamin D, Ergocalciferol, (DRISDOL) 1.25 MG (50000 UT) CAPS capsule Take 1 capsule (50,000 Units total) by mouth every 7 (seven) days. 4 capsule 0   No current facility-administered medications on file prior to visit.     PAST MEDICAL HISTORY: Past Medical History:  Diagnosis Date  . Anxiety   . Back pain   . Complication of anesthesia 2003   gallbladder surgery-resp. arrest-stop operation and pt. went to ICU  . Depression   . Diabetes ([place])   . Dyspnea   . Gout   . Hypertension   . Leg edema   . Pancreatitis, chronic ([place]) 03/2016  . Sleep apnea     PAST SURGICAL HISTORY: Past Surgical History:  Procedure Laterality Date  . CHOLECYSTECTOMY    . COLONOSCOPY  2013    SOCIAL HISTORY: Social History   Tobacco Use  . Smoking status: Never Smoker  . Smokeless tobacco: Never Used  Substance Use Topics  . Alcohol use: No    Alcohol/week: 0.0 standard drinks  . Drug use: No    FAMILY HISTORY: Family History  Problem Relation Age of Onset  . Dementia Mother   . AAA (abdominal aortic aneurysm) Father   . Breast cancer Maternal Aunt      ROS: Review of Systems  Constitutional: Negative for weight loss.  Gastrointestinal: Negative for nausea and vomiting.  Musculoskeletal:       Negative for muscle weakness.  Endo/Heme/Allergies:       Negative for hypoglycemia.    PHYSICAL EXAM: Blood pressure 117/67, pulse 69, temperature (!) 97.5 F (36.4 C), temperature source Oral, height 5' (1.524 m), weight 172 lb (78 kg), SpO2 96 %. Body mass index is 33.59 kg/m. Physical Exam Vitals signs reviewed.  Constitutional:      Appearance: Normal appearance. She is obese.  Cardiovascular:     Rate and Rhythm: Normal rate.  Pulmonary:     Effort: Pulmonary effort is normal.  Musculoskeletal: Normal range of motion.  Skin:    General: Skin is warm and dry.  Neurological:     Mental Status: She is alert and  oriented to person, place, and time.  Psychiatric:        Mood and Affect: Mood normal.        Behavior: Behavior normal.     RECENT LABS AND TESTS: BMET    Component Value Date/Time   NA 139 12/24/2018 0916   K 4.9 12/24/2018 0916   CL 100 12/24/2018 0916   CO2 22 12/24/2018 0916   GLUCOSE 126 (H) 12/24/2018 0916   GLUCOSE 111 (H) 11/12/2016 1334   BUN 23 12/24/2018 0916   CREATININE 1.22 (H) 12/24/2018 0916   CALCIUM 10.0 12/24/2018 0916   GFRNONAA 43 (L) 12/24/2018 0916   GFRAA 50 (L) 12/24/2018 0916   Lab Results  Component Value Date   HGBA1C 6.4 (H) 12/24/2018   HGBA1C 6.4 (A) 08/20/2018   HGBA1C 6.9 (H) 06/19/2018   HGBA1C 7.3 (H) 01/21/2018   HGBA1C 6.2 (H) 03/22/2016   Lab Results  Component Value Date   INSULIN 27.4 (H) 12/24/2018   INSULIN 26.1 (H) 06/19/2018   INSULIN 44.8 (H) 01/21/2018   CBC    Component Value Date/Time   WBC 9.3 08/20/2018   WBC 10.8 (H) 11/12/2016 1334   RBC 4.58 06/19/2018 0958   RBC 4.36 11/12/2016 1334   HGB 15.0 08/20/2018   HGB 13.8 06/19/2018 0958   HCT 43 08/20/2018   HCT 42.5 06/19/2018 0958   PLT 298 08/20/2018   MCV 93 06/19/2018 0958   MCH  30.1 06/19/2018 0958   MCH 30.7 11/12/2016 1334   MCHC 32.5 06/19/2018 0958   MCHC 33.1 11/12/2016 1334   RDW 13.8 06/19/2018 0958   LYMPHSABS 2.4 06/19/2018 0958   MONOABS 0.6 03/16/2016 2347   EOSABS 0.1 06/19/2018 0958   BASOSABS 0.0 06/19/2018 0958   Iron/TIBC/Ferritin/ %Sat No results found for: IRON, TIBC, FERRITIN, IRONPCTSAT Lipid Panel     Component Value Date/Time   CHOL 181 08/20/2018   CHOL 184 06/19/2018 0958   TRIG 151 08/20/2018   HDL 45 08/20/2018   HDL 45 06/19/2018 0958   LDLCALC 30 08/20/2018   LDLCALC 107 (H) 06/19/2018 0958   Hepatic Function Panel     Component Value Date/Time   PROT 6.9 12/24/2018 0916   ALBUMIN 4.4 12/24/2018 0916   AST 13 12/24/2018 0916   ALT 16 12/24/2018 0916   ALKPHOS 106 12/24/2018 0916   BILITOT 0.2 12/24/2018 0916      Component Value Date/Time   TSH 1.950 01/21/2018 0859   Results for TEQUITA, MARRS (MRN [id number]) as of 01/08/2019 07:31  Ref. Range 12/24/2018 09:16  Vitamin D, 25-Hydroxy Latest Ref Range: 30.0 - 100.0 ng/mL 31.4    OBESITY BEHAVIORAL INTERVENTION VISIT  Today's visit was # 16   Starting weight: 183 lbs Starting date: 01/20/18 Today's weight : Weight: 172 lb (78 kg)  Today's date: 01/07/2019 Total lbs lost to date: 11 At least 15 minutes were spent on discussing the following behavioral intervention visit.  ASK: We discussed the diagnosis of obesity with Judy [name] today and Judy [name] agreed to give [name] permission to discuss obesity behavioral modification therapy today.  ASSESS: Judy [name] has the diagnosis of obesity and her BMI today is 33.5. Judy [name] is in the action stage of change.   ADVISE: Judy [name] was educated on the multiple health risks of obesity as well as the benefit of weight loss to improve her health. She was advised of the need for long term treatment and the importance of lifestyle modifications to improve her current  health and to decrease her risk of future health  problems.  AGREE: Multiple dietary modification options and treatment options were discussed and Judy [name] agreed to follow the recommendations documented in the above note.  ARRANGE: Judy [name] was educated on the importance of frequent visits to treat obesity as outlined per CMS and USPSTF guidelines and agreed to schedule her next follow up appointment today.  I, Marcille Blanco, am acting as Location manager for [employer], FNP-C.  I have reviewed the above documentation for accuracy and completeness, and I agree with the above.  - TRUE Garciamartinez, FNP-C.

## 2019-01-09 ENCOUNTER — Other Ambulatory Visit (INDEPENDENT_AMBULATORY_CARE_PROVIDER_SITE_OTHER): Payer: Self-pay | Admitting: Family Medicine

## 2019-01-09 DIAGNOSIS — N183 Chronic kidney disease, stage 3 (moderate): Principal | ICD-10-CM

## 2019-01-09 DIAGNOSIS — E1122 Type 2 diabetes mellitus with diabetic chronic kidney disease: Secondary | ICD-10-CM

## 2019-01-12 ENCOUNTER — Other Ambulatory Visit (INDEPENDENT_AMBULATORY_CARE_PROVIDER_SITE_OTHER): Payer: Self-pay | Admitting: Family Medicine

## 2019-01-12 DIAGNOSIS — N183 Chronic kidney disease, stage 3 unspecified: Secondary | ICD-10-CM

## 2019-01-12 DIAGNOSIS — E1122 Type 2 diabetes mellitus with diabetic chronic kidney disease: Secondary | ICD-10-CM

## 2019-01-13 ENCOUNTER — Other Ambulatory Visit (INDEPENDENT_AMBULATORY_CARE_PROVIDER_SITE_OTHER): Payer: Self-pay | Admitting: Family Medicine

## 2019-01-13 DIAGNOSIS — N183 Chronic kidney disease, stage 3 (moderate): Secondary | ICD-10-CM

## 2019-01-13 DIAGNOSIS — E559 Vitamin D deficiency, unspecified: Secondary | ICD-10-CM

## 2019-01-13 DIAGNOSIS — E1122 Type 2 diabetes mellitus with diabetic chronic kidney disease: Secondary | ICD-10-CM

## 2019-01-14 MED ORDER — VITAMIN D (ERGOCALCIFEROL) 1.25 MG (50000 UNIT) PO CAPS: 50000.0000 [IU] | ORAL_CAPSULE | ORAL | 0 refills | Status: DC

## 2019-01-14 MED ORDER — ACCU-CHEK SAFE-T PRO LANCETS MISC: 0 refills | Status: DC

## 2019-01-26 ENCOUNTER — Encounter (INDEPENDENT_AMBULATORY_CARE_PROVIDER_SITE_OTHER): Payer: Self-pay

## 2019-01-26 ENCOUNTER — Ambulatory Visit (INDEPENDENT_AMBULATORY_CARE_PROVIDER_SITE_OTHER): Payer: Medicare Other | Admitting: Family Medicine

## 2019-02-11 ENCOUNTER — Ambulatory Visit (INDEPENDENT_AMBULATORY_CARE_PROVIDER_SITE_OTHER): Payer: Medicare Other | Admitting: Family Medicine

## 2019-02-24 ENCOUNTER — Other Ambulatory Visit (INDEPENDENT_AMBULATORY_CARE_PROVIDER_SITE_OTHER): Payer: Self-pay | Admitting: Family Medicine

## 2019-02-24 ENCOUNTER — Ambulatory Visit (INDEPENDENT_AMBULATORY_CARE_PROVIDER_SITE_OTHER): Payer: Medicare Other | Admitting: Physician Assistant

## 2019-02-24 ENCOUNTER — Encounter (INDEPENDENT_AMBULATORY_CARE_PROVIDER_SITE_OTHER): Payer: Self-pay

## 2019-02-24 DIAGNOSIS — E1122 Type 2 diabetes mellitus with diabetic chronic kidney disease: Secondary | ICD-10-CM

## 2019-02-24 DIAGNOSIS — E559 Vitamin D deficiency, unspecified: Secondary | ICD-10-CM

## 2019-02-24 DIAGNOSIS — N183 Chronic kidney disease, stage 3 (moderate): Principal | ICD-10-CM

## 2019-03-02 ENCOUNTER — Other Ambulatory Visit (INDEPENDENT_AMBULATORY_CARE_PROVIDER_SITE_OTHER): Payer: Self-pay | Admitting: Family Medicine

## 2019-03-02 ENCOUNTER — Encounter (INDEPENDENT_AMBULATORY_CARE_PROVIDER_SITE_OTHER): Payer: Self-pay | Admitting: Family Medicine

## 2019-03-02 DIAGNOSIS — N183 Chronic kidney disease, stage 3 (moderate): Principal | ICD-10-CM

## 2019-03-02 DIAGNOSIS — E559 Vitamin D deficiency, unspecified: Secondary | ICD-10-CM

## 2019-03-02 DIAGNOSIS — E1122 Type 2 diabetes mellitus with diabetic chronic kidney disease: Secondary | ICD-10-CM

## 2019-03-03 ENCOUNTER — Encounter (INDEPENDENT_AMBULATORY_CARE_PROVIDER_SITE_OTHER): Payer: Self-pay

## 2019-03-05 ENCOUNTER — Encounter (INDEPENDENT_AMBULATORY_CARE_PROVIDER_SITE_OTHER): Payer: Self-pay | Admitting: Physician Assistant

## 2019-03-10 ENCOUNTER — Other Ambulatory Visit (INDEPENDENT_AMBULATORY_CARE_PROVIDER_SITE_OTHER): Payer: Self-pay | Admitting: Family Medicine

## 2019-03-10 DIAGNOSIS — N183 Chronic kidney disease, stage 3 unspecified: Secondary | ICD-10-CM

## 2019-03-10 DIAGNOSIS — E1122 Type 2 diabetes mellitus with diabetic chronic kidney disease: Secondary | ICD-10-CM

## 2019-03-10 DIAGNOSIS — E559 Vitamin D deficiency, unspecified: Secondary | ICD-10-CM

## 2019-03-14 ENCOUNTER — Encounter (INDEPENDENT_AMBULATORY_CARE_PROVIDER_SITE_OTHER): Payer: Self-pay | Admitting: Physician Assistant

## 2019-03-16 NOTE — Telephone Encounter (Signed)
Please review

## 2019-06-30 ENCOUNTER — Other Ambulatory Visit: Payer: Self-pay | Admitting: Internal Medicine

## 2019-06-30 DIAGNOSIS — Z1231 Encounter for screening mammogram for malignant neoplasm of breast: Secondary | ICD-10-CM

## 2019-08-17 ENCOUNTER — Ambulatory Visit
Admission: RE | Admit: 2019-08-17 | Discharge: 2019-08-17 | Disposition: A | Discharge status: Home / Self Care | Payer: Medicare Other | Source: Ambulatory Visit | Attending: Internal Medicine | Admitting: Internal Medicine

## 2019-08-17 ENCOUNTER — Other Ambulatory Visit: Payer: Self-pay

## 2019-08-17 DIAGNOSIS — Z1231 Encounter for screening mammogram for malignant neoplasm of breast: Secondary | ICD-10-CM

## 2019-08-18 ENCOUNTER — Other Ambulatory Visit: Payer: Self-pay | Admitting: Internal Medicine

## 2019-08-18 DIAGNOSIS — R928 Other abnormal and inconclusive findings on diagnostic imaging of breast: Secondary | ICD-10-CM

## 2019-08-25 ENCOUNTER — Other Ambulatory Visit: Payer: Self-pay

## 2019-08-25 ENCOUNTER — Ambulatory Visit
Admission: RE | Admit: 2019-08-25 | Discharge: 2019-08-25 | Disposition: A | Discharge status: Home / Self Care | Payer: Medicare Other | Source: Ambulatory Visit | Attending: Internal Medicine | Admitting: Internal Medicine

## 2019-08-25 ENCOUNTER — Other Ambulatory Visit: Payer: Self-pay | Admitting: Internal Medicine

## 2019-08-25 DIAGNOSIS — R928 Other abnormal and inconclusive findings on diagnostic imaging of breast: Secondary | ICD-10-CM

## 2019-08-26 ENCOUNTER — Ambulatory Visit
Admission: RE | Admit: 2019-08-26 | Discharge: 2019-08-26 | Disposition: A | Discharge status: Home / Self Care | Payer: Medicare Other | Source: Ambulatory Visit | Attending: Internal Medicine | Admitting: Internal Medicine

## 2019-08-26 ENCOUNTER — Other Ambulatory Visit: Payer: Self-pay | Admitting: Diagnostic Radiology

## 2019-08-26 DIAGNOSIS — R928 Other abnormal and inconclusive findings on diagnostic imaging of breast: Secondary | ICD-10-CM

## 2019-09-10 ENCOUNTER — Ambulatory Visit: Payer: Self-pay | Admitting: General Surgery

## 2019-09-10 DIAGNOSIS — N6021 Fibroadenosis of right breast: Secondary | ICD-10-CM

## 2019-09-15 ENCOUNTER — Other Ambulatory Visit: Payer: Self-pay | Admitting: General Surgery

## 2019-09-15 DIAGNOSIS — N6021 Fibroadenosis of right breast: Secondary | ICD-10-CM

## 2019-09-16 NOTE — Pre-Procedure Instructions (Signed)
[facility] PHARMACY # [place], [address] [place] [address] Phone: [phone] Fax: [place], [address] AT [facility] OF [address] [place] [address] [place] [address] Phone: [phone] Fax: [phone]      Your procedure is scheduled on 10-23-19  Report to [facility] Main Entrance "A" at 0930 A.M., and check in at the Admitting office.  Call this number if you have problems the morning of surgery:  [phone]  Call [phone] if you have any questions prior to your surgery date Monday-Friday 8am-4pm    Remember:  Do not eat  after midnight the night before your surgery  You may drink clear liquids until 0830 the morning of your surgery.   Clear liquids allowed are: Water, Non-Citrus Juices (without pulp), Carbonated Beverages, Clear Tea, Black Coffee Only, and Gatorade   Take these medicines the morning of surgery with A SIP OF WATER : diltiazem (CARDIZEM CD) pantoprazole (PROTONIX)  7 days prior to surgery STOP taking any Aspirin (unless otherwise instructed by your surgeon), Aleve, Naproxen, Ibuprofen, Motrin, Advil, Goody's, BC's, all herbal medications, fish oil, and all vitamins.   WHAT DO I DO ABOUT MY DIABETES MEDICATION?   [name] Do not take oral diabetes medicines (pills) including METFORMIN the morning of surgery.   How to Manage Your Diabetes Before and After Surgery  Why is it important to control my blood sugar before and after surgery? . Improving blood sugar levels before and after surgery helps healing and can limit problems. . A way of improving blood sugar control is eating a healthy diet by: o  Eating less sugar and carbohydrates o  Increasing activity/exercise o  Talking with your doctor about reaching your blood sugar goals . High blood sugars (greater than 180 mg/dL) can raise your risk of infections and slow your  recovery, so you will need to focus on controlling your diabetes during the weeks before surgery. . Make sure that the doctor who takes care of your diabetes knows about your planned surgery including the date and location.  How do I manage my blood sugar before surgery? . Check your blood sugar at least 4 times a day, starting 2 days before surgery, to make sure that the level is not too high or low. o Check your blood sugar the morning of your surgery when you wake up and every 2 hours until you get to the Short Stay unit. . If your blood sugar is less than 70 mg/dL, you will need to treat for low blood sugar: o Do not take insulin. o Treat a low blood sugar (less than 70 mg/dL) with  cup of clear juice (cranberry or apple), 4 glucose tablets, OR glucose gel. o Recheck blood sugar in 15 minutes after treatment (to make sure it is greater than 70 mg/dL). If your blood sugar is not greater than 70 mg/dL on recheck, call [phone] for further instructions. . Report your blood sugar to the short stay nurse when you get to Short Stay.  . If you are admitted to the hospital after surgery: o Your blood sugar will be checked by the staff and you will probably be given insulin after surgery (instead of oral diabetes medicines) to make sure you have good blood sugar levels. o The goal for blood sugar control after surgery is 80-180 mg/dL.     The Morning of Surgery  Do not wear jewelry, make-up or nail polish.  Do not wear lotions, powders, or perfumes, or deodorant  Do not shave 48 hours prior to surgery.   Do not bring valuables to the hospital.  [facility] is not responsible for any belongings or valuables.  If you are a smoker, DO NOT Smoke 24 hours prior to surgery IF you wear a CPAP at night please bring your mask, tubing, and machine the morning of surgery   Remember that you must have someone to transport you home after your surgery, and remain with you for 24 hours if you are  discharged the same day.   Contacts, glasses, hearing aids, dentures or bridgework may not be worn into surgery.    Leave your suitcase in the car.  After surgery it may be brought to your room.  For patients admitted to the hospital, discharge time will be determined by your treatment team.  Patients discharged the day of surgery will not be allowed to drive home.    Special instructions:   [place]- Preparing For Surgery  Before surgery, you can play an important role. Because skin is not sterile, your skin needs to be as free of germs as possible. You can reduce the number of germs on your skin by washing with CHG (chlorahexidine gluconate) Soap before surgery.  CHG is an antiseptic cleaner which kills germs and bonds with the skin to continue killing germs even after washing.    Oral Hygiene is also important to reduce your risk of infection.  Remember - BRUSH YOUR TEETH THE MORNING OF SURGERY WITH YOUR REGULAR TOOTHPASTE  Please do not use if you have an allergy to CHG or antibacterial soaps. If your skin becomes reddened/irritated stop using the CHG.  Do not shave (including legs and underarms) for at least 48 hours prior to first CHG shower. It is OK to shave your face.  Please follow these instructions carefully.   1. Shower the NIGHT BEFORE SURGERY and the MORNING OF SURGERY with CHG Soap.   2. If you chose to wash your hair, wash your hair first as usual with your normal shampoo.  3. After you shampoo, rinse your hair and body thoroughly to remove the shampoo.  4. Use CHG as you would any other liquid soap. You can apply CHG directly to the skin and wash gently with a scrungie or a clean washcloth.   5. Apply the CHG Soap to your body ONLY FROM THE NECK DOWN.  Do not use on open wounds or open sores. Avoid contact with your eyes, ears, mouth and genitals (private parts). Wash Face and genitals (private parts)  with your normal soap.   6. Wash thoroughly, paying special  attention to the area where your surgery will be performed.  7. Thoroughly rinse your body with warm water from the neck down.  8. DO NOT shower/wash with your normal soap after using and rinsing off the CHG Soap.  9. Pat yourself dry with a CLEAN TOWEL.  10. Wear CLEAN PAJAMAS to bed the night before surgery, wear comfortable clothes the morning of surgery  11. Place CLEAN SHEETS on your bed the night of your first shower and DO NOT SLEEP WITH PETS.    Day of Surgery:  Do not apply any deodorants/lotions. Please shower the morning of surgery with the CHG soap  Please wear clean clothes to the hospital/surgery center.   Remember to brush your teeth WITH YOUR REGULAR TOOTHPASTE.   Please  read over the  fact sheets that you were given.

## 2019-09-17 ENCOUNTER — Inpatient Hospital Stay (HOSPITAL_COMMUNITY)
Admission: RE | Admit: 2019-09-17 | Discharge: 2019-09-17 | Disposition: A | Discharge status: Home / Self Care | Payer: Medicare Other | Source: Ambulatory Visit

## 2019-09-18 ENCOUNTER — Inpatient Hospital Stay (HOSPITAL_COMMUNITY): Admission: RE | Admit: 2019-09-18 | Payer: Medicare Other | Source: Ambulatory Visit

## 2019-10-16 NOTE — Progress Notes (Signed)
[facility] PHARMACY # [place], [place] [address] [place] [address] Phone: [phone] Fax: [place], [place] [address] AT [facility] OF [place] [address] [place] [address] Phone: [phone] Fax: [phone]      Your procedure is scheduled on October 23, 2019.  Report to [facility] Main Entrance "A" at 9:30 A.M., and check in at the Admitting office.  Call this number if you have problems the morning of surgery:  [phone]  Call [phone] if you have any questions prior to your surgery date Monday-Friday 8am-4pm    Remember:  Do not eat  after midnight the night before your surgery  You may drink clear liquids until 8:30 A.M.  the morning of your surgery.   Clear liquids allowed are: Water, Non-Citrus Juices (without pulp), Carbonated Beverages, Clear Tea, Black Coffee Only, and Gatorade    Take these medicines the morning of surgery with A SIP OF WATER: diltiazem (CARDIZEM CD) etodolac (LODINE) - if needed   WHAT DO I DO ABOUT MY DIABETES MEDICATION?  [name] Do not take oral diabetes medicines (pills) the morning of surgery.  How to Manage Your Diabetes Before and After Surgery  Why is it important to control my blood sugar before and after surgery? . Improving blood sugar levels before and after surgery helps healing and can limit problems. . A way of improving blood sugar control is eating a healthy diet by: o  Eating less sugar and carbohydrates o  Increasing activity/exercise o  Talking with your doctor about reaching your blood sugar goals . High blood sugars (greater than 180 mg/dL) can raise your risk of infections and slow your recovery, so you will need to focus on controlling your diabetes during the weeks before surgery. . Make sure that the doctor who takes care of your diabetes knows about your planned surgery including the date and  location.  How do I manage my blood sugar before surgery? . Check your blood sugar at least 4 times a day, starting 2 days before surgery, to make sure that the level is not too high or low. o Check your blood sugar the morning of your surgery when you wake up and every 2 hours until you get to the Short Stay unit. . If your blood sugar is less than 70 mg/dL, you will need to treat for low blood sugar: o Do not take insulin. o Treat a low blood sugar (less than 70 mg/dL) with  cup of clear juice (cranberry or apple), 4 glucose tablets, OR glucose gel. o Recheck blood sugar in 15 minutes after treatment (to make sure it is greater than 70 mg/dL). If your blood sugar is not greater than 70 mg/dL on recheck, call [phone] for further instructions. . Report your blood sugar to the short stay nurse when you get to Short Stay.  . If you are admitted to the hospital after surgery: o Your blood sugar will be checked by the staff and you will probably be given insulin after surgery (instead of oral diabetes medicines) to make sure you have good blood sugar levels. o The goal for blood sugar control after surgery is 80-180 mg/dL.  As of today, STOP taking any Aspirin (unless otherwise instructed by your surgeon), Aleve, Naproxen, Ibuprofen, Motrin, Advil, Goody's, BC's, all herbal medications, fish oil, and all vitamins.    The Morning of Surgery  Do not wear jewelry, make-up or nail polish.  Do not wear lotions, powders, or perfumes, or deodorant  Do not shave 48 hours prior to surgery.    Do not bring valuables to the hospital.  [facility] is not responsible for any belongings or valuables.  If you are a smoker, DO NOT Smoke 24 hours prior to surgery IF you wear a CPAP at night please bring your mask, tubing, and machine the morning of surgery   Remember that you must have someone to transport you home after your surgery, and remain with you for 24 hours if you are discharged the same  day.   Contacts, glasses, hearing aids, dentures or bridgework may not be worn into surgery.    Leave your suitcase in the car.  After surgery it may be brought to your room.  For patients admitted to the hospital, discharge time will be determined by your treatment team.  Patients discharged the day of surgery will not be allowed to drive home.    Special instructions:   [place]- Preparing For Surgery  Before surgery, you can play an important role. Because skin is not sterile, your skin needs to be as free of germs as possible. You can reduce the number of germs on your skin by washing with CHG (chlorahexidine gluconate) Soap before surgery.  CHG is an antiseptic cleaner which kills germs and bonds with the skin to continue killing germs even after washing.    Oral Hygiene is also important to reduce your risk of infection.  Remember - BRUSH YOUR TEETH THE MORNING OF SURGERY WITH YOUR REGULAR TOOTHPASTE  Please do not use if you have an allergy to CHG or antibacterial soaps. If your skin becomes reddened/irritated stop using the CHG.  Do not shave (including legs and underarms) for at least 48 hours prior to first CHG shower. It is OK to shave your face.  Please follow these instructions carefully.   1. Shower the NIGHT BEFORE SURGERY and the MORNING OF SURGERY with CHG Soap.   2. If you chose to wash your hair, wash your hair first as usual with your normal shampoo.  3. After you shampoo, rinse your hair and body thoroughly to remove the shampoo.  4. Use CHG as you would any other liquid soap. You can apply CHG directly to the skin and wash gently with a scrungie or a clean washcloth.   5. Apply the CHG Soap to your body ONLY FROM THE NECK DOWN.  Do not use on open wounds or open sores. Avoid contact with your eyes, ears, mouth and genitals (private parts). Wash Face and genitals (private parts)  with your normal soap.   6. Wash thoroughly, paying special attention to the  area where your surgery will be performed.  7. Thoroughly rinse your body with warm water from the neck down.  8. DO NOT shower/wash with your normal soap after using and rinsing off the CHG Soap.  9. Pat yourself dry with a CLEAN TOWEL.  10. Wear CLEAN PAJAMAS to bed the night before surgery, wear comfortable clothes the morning of surgery  11. Place CLEAN SHEETS on your bed the night of your first shower and DO NOT SLEEP WITH PETS.    Day of Surgery:  Do not apply any deodorants/lotions. Please shower the morning of surgery with the CHG soap  Please wear clean clothes to the hospital/surgery center.   Remember to brush your teeth WITH YOUR REGULAR TOOTHPASTE.  Please read over the following fact sheets that you were given.

## 2019-10-19 ENCOUNTER — Other Ambulatory Visit: Payer: Self-pay

## 2019-10-19 ENCOUNTER — Encounter (HOSPITAL_COMMUNITY): Payer: Self-pay

## 2019-10-19 ENCOUNTER — Encounter (HOSPITAL_COMMUNITY)
Admission: RE | Admit: 2019-10-19 | Discharge: 2019-10-19 | Disposition: A | Discharge status: Home / Self Care | Payer: Medicare Other | Source: Ambulatory Visit | Attending: General Surgery | Admitting: General Surgery

## 2019-10-19 DIAGNOSIS — Z01812 Encounter for preprocedural laboratory examination: Secondary | ICD-10-CM | POA: Diagnosis present

## 2019-10-19 DIAGNOSIS — E119 Type 2 diabetes mellitus without complications: Secondary | ICD-10-CM | POA: Insufficient documentation

## 2019-10-19 DIAGNOSIS — Z01810 Encounter for preprocedural cardiovascular examination: Secondary | ICD-10-CM | POA: Insufficient documentation

## 2019-10-19 DIAGNOSIS — I10 Essential (primary) hypertension: Secondary | ICD-10-CM | POA: Diagnosis not present

## 2019-10-19 HISTORY — DX: Personal history of urinary calculi: Z87.442

## 2019-10-19 LAB — CBC
HCT: 41.8 % (ref 36.0–46.0)
Hemoglobin: 13.8 g/dL (ref 12.0–15.0)
MCH: 30.9 pg (ref 26.0–34.0)
MCHC: 33 g/dL (ref 30.0–36.0)
MCV: 93.5 fL (ref 80.0–100.0)
Platelets: 268 10*3/uL (ref 150–400)
RBC: 4.47 MIL/uL (ref 3.87–5.11)
RDW: 12 % (ref 11.5–15.5)
WBC: 8.1 10*3/uL (ref 4.0–10.5)
nRBC: 0 % (ref 0.0–0.2)

## 2019-10-19 LAB — BASIC METABOLIC PANEL
Anion gap: 10 (ref 5–15)
BUN: 15 mg/dL (ref 8–23)
CO2: 23 mmol/L (ref 22–32)
Calcium: 9.2 mg/dL (ref 8.9–10.3)
Chloride: 104 mmol/L (ref 98–111)
Creatinine, Ser: 1.13 mg/dL — ABNORMAL HIGH (ref 0.44–1.00)
GFR calc Af Amer: 54 mL/min — ABNORMAL LOW (ref >60)
GFR calc non Af Amer: 47 mL/min — ABNORMAL LOW (ref >60)
Glucose, Bld: 229 mg/dL — ABNORMAL HIGH (ref 70–99)
Potassium: 4 mmol/L (ref 3.5–5.1)
Sodium: 137 mmol/L (ref 135–145)

## 2019-10-19 LAB — GLUCOSE, CAPILLARY: Glucose-Capillary: 204 mg/dL — ABNORMAL HIGH (ref 70–99)

## 2019-10-19 LAB — HEMOGLOBIN A1C
Hgb A1c MFr Bld: 7.6 % — ABNORMAL HIGH (ref 4.8–5.6)
Mean Plasma Glucose: 171.42 mg/dL

## 2019-10-19 NOTE — Progress Notes (Signed)
PCP - Dr. Levin Erp Cardiologist - patient denies  PPM/ICD - n/a Device Orders -  Rep Notified -   Chest x-ray - n/a EKG - 10/19/2019 Stress Test - patient denies ECHO - 2017 Cardiac Cath - patient denies  Sleep Study - patient states her sleep study was done over 15 or more years ago. CPAP - yes  Fasting Blood Sugar - 120-150 Checks Blood Sugar - one time a week per her PCP CBG at PAT was in the 200's.  Patient stated she did have a tootsie pop.  Obtaining an A1c at PAT  Blood Thinner Instructions: n/a Aspirin Instructions: n/a  ERAS Protcol - clear until 0830 PRE-SURGERY Ensure or G2- none ordered  COVID TEST- 10/20/2019   Anesthesia review: yes, hx of echo  Patient denies shortness of breath, fever, cough and chest pain at PAT appointment   All instructions explained to the patient, with a verbal understanding of the material. Patient agrees to go over the instructions while at home for a better understanding. Patient also instructed to self quarantine after being tested for COVID-19. The opportunity to ask questions was provided.

## 2019-10-20 ENCOUNTER — Other Ambulatory Visit: Payer: Self-pay

## 2019-10-20 ENCOUNTER — Inpatient Hospital Stay (HOSPITAL_COMMUNITY)
Admission: RE | Admit: 2019-10-20 | Discharge: 2019-10-20 | Disposition: A | Discharge status: Home / Self Care | Payer: Medicare Other | Source: Ambulatory Visit

## 2019-10-20 DIAGNOSIS — Z20822 Contact with and (suspected) exposure to covid-19: Secondary | ICD-10-CM

## 2019-10-20 NOTE — Progress Notes (Signed)
Anesthesia Chart Review: Anesthesia history: Pt reports "respiratory arrest" during gallbladder surgery in 2003. Review of anesthesia records and medical records from that time indicate pt experienced severe hypotension about 20 minutes into a lap chole procedure on 07/13/02 that did not respond to dopamine, epinephrine, Neo, fluid resuscitation or trendelenburg. Surgery was aborted, cardiology consulted. Pt transferred to ICU. Notes from cardiology indicate hypotension likely due to hypovolemic shock; apparently pre-op hgb was normal but intraop hgb was 8. No source of bleeding identified, notes question possible GI bleed. Pt received blood transfusion. Per discharge summary, no definitive cause of hypotension identified. Pt recovered over course of several hours and 2 days later she returned to the OR to complete cholecystectomy. Intubation from this surgery was difficult. Notes state that on 4th attempt, nasotracheal intubation was successful using 7.5 tube.  MAC3 x2 and bougie attempts unsuccessful.   Pt had lumbar fusion in 0000000 without complication. Glidescope was used due to hx of difficult intubation and limited oral opening.  Preoperative labs reviewed, A1c 7.6 and CBG 204 c/w pt's DMII. Otherwise WNL  EKG 09/18/19: NSR. Rate 78  Echo 03/23/16:  - Left ventricle: The cavity size was normal. Wall thickness wasnormal. Systolic function was normal. The estimated ejectionfraction was in the range of 60% to 65%. Wall motion was normal;there were no regional wall motion abnormalities. Dopplerparameters are consistent with abnormal left ventricularrelaxation (grade 1 diastolic dysfunction). - Aortic valve: There was no stenosis. - Mitral valve: There was no significant regurgitation. - Right ventricle: The cavity size was normal. Systolic functionwas normal. - Pulmonary arteries: No complete TR doppler jet so unable toestimate PA systolic pressure. - Inferior vena cava: The vessel was  normal in size. The respirophasic diameter changes were in the normal range (>= 50%), consistent with normal central venous pressure.  Wynonia Musty [facility] Short Stay Center/Anesthesiology Phone [phone] 10/20/2019 4:09 PM

## 2019-10-21 LAB — NOVEL CORONAVIRUS, NAA: SARS-CoV-2, NAA: NOT DETECTED

## 2019-10-21 NOTE — Progress Notes (Signed)
Pt scheduled 10/20/19 for covid test, appears patient went to community site. Attempted to contact patient to inform her she needs to come back to [facility] PAT site today to ensure her results are back in time for her seed tomorrow. Left voicemail and call back number for patient to return call.

## 2019-10-22 ENCOUNTER — Ambulatory Visit
Admission: RE | Admit: 2019-10-22 | Discharge: 2019-10-22 | Disposition: A | Discharge status: Home / Self Care | Payer: Medicare Other | Source: Ambulatory Visit | Attending: General Surgery | Admitting: General Surgery

## 2019-10-22 ENCOUNTER — Other Ambulatory Visit: Payer: Self-pay | Admitting: General Surgery

## 2019-10-22 ENCOUNTER — Other Ambulatory Visit: Payer: Self-pay

## 2019-10-22 DIAGNOSIS — N6021 Fibroadenosis of right breast: Secondary | ICD-10-CM

## 2019-10-23 ENCOUNTER — Ambulatory Visit (HOSPITAL_COMMUNITY)
Admission: RE | Admit: 2019-10-23 | Discharge: 2019-10-23 | Disposition: A | Discharge status: Home / Self Care | Payer: Medicare Other | Attending: General Surgery | Admitting: General Surgery

## 2019-10-23 ENCOUNTER — Other Ambulatory Visit: Payer: Self-pay

## 2019-10-23 ENCOUNTER — Encounter (HOSPITAL_COMMUNITY): Payer: Self-pay

## 2019-10-23 ENCOUNTER — Ambulatory Visit
Admission: RE | Admit: 2019-10-23 | Discharge: 2019-10-23 | Disposition: A | Discharge status: Home / Self Care | Payer: Medicare Other | Source: Ambulatory Visit | Attending: General Surgery | Admitting: General Surgery

## 2019-10-23 ENCOUNTER — Encounter (HOSPITAL_COMMUNITY)
Admission: RE | Disposition: A | Discharge status: Home / Self Care | Payer: Self-pay | Source: Home / Self Care | Attending: General Surgery

## 2019-10-23 ENCOUNTER — Ambulatory Visit (HOSPITAL_COMMUNITY): Payer: Medicare Other | Admitting: Physician Assistant

## 2019-10-23 ENCOUNTER — Ambulatory Visit (HOSPITAL_COMMUNITY): Payer: Medicare Other | Admitting: Anesthesiology

## 2019-10-23 DIAGNOSIS — I10 Essential (primary) hypertension: Secondary | ICD-10-CM | POA: Diagnosis not present

## 2019-10-23 DIAGNOSIS — Z79899 Other long term (current) drug therapy: Secondary | ICD-10-CM | POA: Diagnosis not present

## 2019-10-23 DIAGNOSIS — N6489 Other specified disorders of breast: Secondary | ICD-10-CM | POA: Insufficient documentation

## 2019-10-23 DIAGNOSIS — Z8249 Family history of ischemic heart disease and other diseases of the circulatory system: Secondary | ICD-10-CM | POA: Diagnosis not present

## 2019-10-23 DIAGNOSIS — D241 Benign neoplasm of right breast: Secondary | ICD-10-CM | POA: Diagnosis not present

## 2019-10-23 DIAGNOSIS — F419 Anxiety disorder, unspecified: Secondary | ICD-10-CM | POA: Diagnosis not present

## 2019-10-23 DIAGNOSIS — E119 Type 2 diabetes mellitus without complications: Secondary | ICD-10-CM | POA: Diagnosis not present

## 2019-10-23 DIAGNOSIS — Z7984 Long term (current) use of oral hypoglycemic drugs: Secondary | ICD-10-CM | POA: Diagnosis not present

## 2019-10-23 DIAGNOSIS — N6021 Fibroadenosis of right breast: Secondary | ICD-10-CM

## 2019-10-23 DIAGNOSIS — F329 Major depressive disorder, single episode, unspecified: Secondary | ICD-10-CM | POA: Insufficient documentation

## 2019-10-23 DIAGNOSIS — G4730 Sleep apnea, unspecified: Secondary | ICD-10-CM | POA: Insufficient documentation

## 2019-10-23 HISTORY — PX: BREAST LUMPECTOMY WITH RADIOACTIVE SEED LOCALIZATION: SHX6424

## 2019-10-23 LAB — GLUCOSE, CAPILLARY
Glucose-Capillary: 135 mg/dL — ABNORMAL HIGH (ref 70–99)
Glucose-Capillary: 139 mg/dL — ABNORMAL HIGH (ref 70–99)

## 2019-10-23 SURGERY — BREAST LUMPECTOMY WITH RADIOACTIVE SEED LOCALIZATION
Anesthesia: General | Site: Breast | Laterality: Right

## 2019-10-23 MED ORDER — ACETAMINOPHEN 500 MG PO TABS
1000.0000 mg | ORAL_TABLET | ORAL | Status: AC
  Administered 2019-10-23: 1000 mg via ORAL
  Filled 2019-10-23: qty 2

## 2019-10-23 MED ORDER — LIDOCAINE HCL (CARDIAC) PF 100 MG/5ML IV SOSY
PREFILLED_SYRINGE | INTRAVENOUS | Status: DC | PRN
  Administered 2019-10-23: 100 mg via INTRATRACHEAL

## 2019-10-23 MED ORDER — MEPERIDINE HCL 25 MG/ML IJ SOLN: 6.2500 mg | INTRAMUSCULAR | Status: DC | PRN

## 2019-10-23 MED ORDER — PROPOFOL 10 MG/ML IV BOLUS
INTRAVENOUS | Status: DC | PRN
  Administered 2019-10-23: 150 mg via INTRAVENOUS

## 2019-10-23 MED ORDER — MIDAZOLAM HCL 5 MG/5ML IJ SOLN
INTRAMUSCULAR | Status: DC | PRN
  Administered 2019-10-23: 1 mg via INTRAVENOUS

## 2019-10-23 MED ORDER — HYDROCODONE-ACETAMINOPHEN 5-325 MG PO TABS: 1.0000 | ORAL_TABLET | Freq: Four times a day (QID) | ORAL | 0 refills | Status: DC | PRN

## 2019-10-23 MED ORDER — LACTATED RINGERS IV SOLN
INTRAVENOUS | Status: DC | PRN
  Administered 2019-10-23: 12:00:00 via INTRAVENOUS

## 2019-10-23 MED ORDER — CHLORHEXIDINE GLUCONATE CLOTH 2 % EX PADS: 6.0000 | MEDICATED_PAD | Freq: Once | CUTANEOUS | Status: DC

## 2019-10-23 MED ORDER — LACTATED RINGERS IV SOLN: INTRAVENOUS | Status: DC

## 2019-10-23 MED ORDER — BUPIVACAINE-EPINEPHRINE 0.25% -1:200000 IJ SOLN
INTRAMUSCULAR | Status: DC | PRN
  Administered 2019-10-23: 20 mL

## 2019-10-23 MED ORDER — CEFAZOLIN SODIUM-DEXTROSE 2-4 GM/100ML-% IV SOLN
2.0000 g | INTRAVENOUS | Status: AC
  Administered 2019-10-23: 2 g via INTRAVENOUS
  Filled 2019-10-23: qty 100

## 2019-10-23 MED ORDER — GABAPENTIN 300 MG PO CAPS
300.0000 mg | ORAL_CAPSULE | ORAL | Status: AC
  Administered 2019-10-23: 10:00:00 300 mg via ORAL
  Filled 2019-10-23: qty 1

## 2019-10-23 MED ORDER — HYDROMORPHONE HCL 1 MG/ML IJ SOLN: 0.2500 mg | INTRAMUSCULAR | Status: DC | PRN

## 2019-10-23 MED ORDER — ONDANSETRON HCL 4 MG/2ML IJ SOLN
INTRAMUSCULAR | Status: DC | PRN
  Administered 2019-10-23: 4 mg via INTRAVENOUS

## 2019-10-23 MED ORDER — ONDANSETRON HCL 4 MG/2ML IJ SOLN: 4.0000 mg | Freq: Once | INTRAMUSCULAR | Status: DC | PRN

## 2019-10-23 MED ORDER — PROPOFOL 10 MG/ML IV BOLUS
INTRAVENOUS | Status: AC
  Filled 2019-10-23: qty 20

## 2019-10-23 MED ORDER — FENTANYL CITRATE (PF) 250 MCG/5ML IJ SOLN
INTRAMUSCULAR | Status: DC | PRN
  Administered 2019-10-23: 50 ug via INTRAVENOUS

## 2019-10-23 MED ORDER — CELECOXIB 200 MG PO CAPS
200.0000 mg | ORAL_CAPSULE | ORAL | Status: AC
  Administered 2019-10-23: 200 mg via ORAL
  Filled 2019-10-23: qty 1

## 2019-10-23 MED ORDER — DEXAMETHASONE SODIUM PHOSPHATE 10 MG/ML IJ SOLN
INTRAMUSCULAR | Status: DC | PRN
  Administered 2019-10-23: 10 mg via INTRAVENOUS

## 2019-10-23 MED ORDER — 0.9 % SODIUM CHLORIDE (POUR BTL) OPTIME
TOPICAL | Status: DC | PRN
  Administered 2019-10-23: 1000 mL

## 2019-10-23 MED ORDER — MIDAZOLAM HCL 2 MG/2ML IJ SOLN
INTRAMUSCULAR | Status: AC
  Filled 2019-10-23: qty 2

## 2019-10-23 MED ORDER — FENTANYL CITRATE (PF) 250 MCG/5ML IJ SOLN
INTRAMUSCULAR | Status: AC
  Filled 2019-10-23: qty 5

## 2019-10-23 SURGICAL SUPPLY — 32 items
APPLIER CLIP 9.375 MED OPEN (MISCELLANEOUS)
BINDER BREAST LRG (GAUZE/BANDAGES/DRESSINGS) IMPLANT
BINDER BREAST XLRG (GAUZE/BANDAGES/DRESSINGS) IMPLANT
CANISTER SUCT 3000ML PPV (MISCELLANEOUS) ×2 IMPLANT
CHLORAPREP W/TINT 26 (MISCELLANEOUS) ×2 IMPLANT
CLIP APPLIE 9.375 MED OPEN (MISCELLANEOUS) IMPLANT
COVER PROBE W GEL 5X96 (DRAPES) ×2 IMPLANT
COVER SURGICAL LIGHT HANDLE (MISCELLANEOUS) ×2 IMPLANT
COVER WAND RF STERILE (DRAPES) IMPLANT
DERMABOND ADVANCED (GAUZE/BANDAGES/DRESSINGS) ×1
DERMABOND ADVANCED .7 DNX12 (GAUZE/BANDAGES/DRESSINGS) ×1 IMPLANT
DEVICE DUBIN SPECIMEN MAMMOGRA (MISCELLANEOUS) ×2 IMPLANT
DRAPE CHEST BREAST 15X10 FENES (DRAPES) ×2 IMPLANT
ELECT COATED BLADE 2.86 ST (ELECTRODE) ×2 IMPLANT
ELECT REM PT RETURN 9FT ADLT (ELECTROSURGICAL) ×2
ELECTRODE REM PT RTRN 9FT ADLT (ELECTROSURGICAL) ×1 IMPLANT
GLOVE BIO SURGEON STRL SZ7.5 (GLOVE) ×4 IMPLANT
GOWN STRL REUS W/ TWL LRG LVL3 (GOWN DISPOSABLE) ×2 IMPLANT
GOWN STRL REUS W/TWL LRG LVL3 (GOWN DISPOSABLE) ×2
KIT BASIN OR (CUSTOM PROCEDURE TRAY) ×2 IMPLANT
KIT MARKER MARGIN INK (KITS) ×2 IMPLANT
LIGHT WAVEGUIDE WIDE FLAT (MISCELLANEOUS) IMPLANT
NDL HYPO 25GX1X1/2 BEV (NEEDLE) ×1 IMPLANT
NEEDLE HYPO 25GX1X1/2 BEV (NEEDLE) ×2 IMPLANT
NS IRRIG 1000ML POUR BTL (IV SOLUTION) ×2 IMPLANT
PACK GENERAL/GYN (CUSTOM PROCEDURE TRAY) ×2 IMPLANT
SUT MNCRL AB 4-0 PS2 18 (SUTURE) ×2 IMPLANT
SUT SILK 2 0 SH (SUTURE) IMPLANT
SUT VIC AB 3-0 SH 18 (SUTURE) ×2 IMPLANT
SYR CONTROL 10ML LL (SYRINGE) ×2 IMPLANT
TOWEL GREEN STERILE (TOWEL DISPOSABLE) ×2 IMPLANT
TOWEL GREEN STERILE FF (TOWEL DISPOSABLE) ×2 IMPLANT

## 2019-10-23 NOTE — Anesthesia Procedure Notes (Signed)
Procedure Name: LMA Insertion Date/Time: 10/23/2019 11:57 AM Performed by: Clovis Cao, CRNA Pre-anesthesia Checklist: Patient identified, Emergency Drugs available, Suction available, Patient being monitored and Timeout performed Patient Re-evaluated:Patient Re-evaluated prior to induction Oxygen Delivery Method: Circle system utilized Preoxygenation: Pre-oxygenation with 100% oxygen Induction Type: IV induction Ventilation: Mask ventilation without difficulty LMA: LMA inserted LMA Size: 4.0 Number of attempts: 1 Placement Confirmation: positive ETCO2 and breath sounds checked- equal and bilateral Tube secured with: Tape Dental Injury: Teeth and Oropharynx as per pre-operative assessment

## 2019-10-23 NOTE — Transfer of Care (Signed)
Immediate Anesthesia Transfer of Care Note  Patient: Judy [name]  Procedure(s) Performed: RIGHT BREAST LUMPECTOMY WITH RADIOACTIVE SEED LOCALIZATION (Right Breast)  Patient Location: PACU  Anesthesia Type:General  Level of Consciousness: drowsy and patient cooperative  Airway & Oxygen Therapy: Patient Spontanous Breathing and Patient connected to face mask oxygen  Post-op Assessment: Report given to RN, Post -op Vital signs reviewed and stable and Patient moving all extremities  Post vital signs: Reviewed and stable  Last Vitals:  Vitals Value Taken Time  BP 124/62 10/23/19 1243  Temp    Pulse 78 10/23/19 1244  Resp 16 10/23/19 1244  SpO2 95 % 10/23/19 1244  Vitals shown include unvalidated device data.  Last Pain:  Vitals:   10/23/19 1010  TempSrc:   PainSc: 0-No pain         Complications: No apparent anesthesia complications

## 2019-10-23 NOTE — Interval H&P Note (Signed)
History and Physical Interval Note:  10/23/2019 11:08 AM  Judy [name]  has presented today for surgery, with the diagnosis of RIGHT BREAST COMPLEX SCLEROSING LESION.  The various methods of treatment have been discussed with the patient and family. After consideration of risks, benefits and other options for treatment, the patient has consented to  Procedure(s): RIGHT BREAST LUMPECTOMY WITH RADIOACTIVE SEED LOCALIZATION (Right) as a surgical intervention.  The patient's history has been reviewed, patient examined, no change in status, stable for surgery.  I have reviewed the patient's chart and labs.  Questions were answered to the patient's satisfaction.     Autumn Messing III

## 2019-10-23 NOTE — H&P (Signed)
Judy [name]  Location: [facility] Patient #: [id number] DOB: Apr 22, 1941 Divorced / Language: Cleophus Molt / Race: White Female   History of Present Illness  The patient is a 78 year old female who presents with a breast mass. We are asked to see the patient in consultation by Dr. Zada Girt to evaluate her for a complex sclerosing lesion of the right breast. The patient is a 78 year old white female who recently went for a routine screening mammogram. At that time she was found to have a 6 mm mass in the lower inner quadrant of the right breast that was abnormal. This was biopsied and came back as a complex sclerosing lesion. She denies any breast pain or discharge from the nipple. Her only family history of breast cancer is in a maternal aunt. She does not smoke.   Past Surgical History Breast Biopsy  Right. Gallbladder Surgery - Open  Hysterectomy (not due to cancer) - Complete  Spinal Surgery - Lower Back   Diagnostic Studies History Mammogram  within last year Pap Smear  >5 years ago  Allergies  No Known Allergies   Medication History  metFORMIN HCl (500MG  Tablet, Oral) Active. Pantoprazole Sodium (40MG  Tablet DR, Oral) Active. Clorazepate Dipotassium (3.75MG  Capsule, Oral) Active. Imipramine HCl (50MG  Tablet, Oral) Active. Spironolactone (25MG  Tablet, Oral) Active. dilTIAZem CD (120MG  Capsule ER 24HR, Oral) Active. Etodolac (300MG  Capsule, Oral) Active. Ibuprofen (200MG  Capsule, 1 (one) Oral) Active. Medications Reconciled  Social History  Caffeine use  Carbonated beverages. No alcohol use  No drug use  Tobacco use  Never smoker.  Family History  Alcohol Abuse  Son. Hypertension  Son.  Pregnancy / Birth History  Gravida  0 Maternal age  50-25 Para  0  Other Problems  Anxiety Disorder  Bladder Problems  Depression  High blood pressure  Sleep Apnea     Review of Systems General Not Present- Appetite Loss,  Chills, Fatigue, Fever, Night Sweats, Weight Gain and Weight Loss. Note: All other systems negative (unless as noted in HPI & included Review of Systems) Skin Not Present- Change in Wart/Mole, Dryness, Hives, Jaundice, New Lesions, Non-Healing Wounds, Rash and Ulcer. HEENT Not Present- Earache, Hearing Loss, Hoarseness, Nose Bleed, Oral Ulcers, Ringing in the Ears, Seasonal Allergies, Sinus Pain, Sore Throat, Visual Disturbances, Wears glasses/contact lenses and Yellow Eyes. Respiratory Not Present- Bloody sputum, Chronic Cough, Difficulty Breathing, Snoring and Wheezing. Breast Not Present- Breast Mass, Breast Pain, Nipple Discharge and Skin Changes. Cardiovascular Not Present- Chest Pain, Difficulty Breathing Lying Down, Leg Cramps, Palpitations, Rapid Heart Rate, Shortness of Breath and Swelling of Extremities. Gastrointestinal Not Present- Abdominal Pain, Bloating, Bloody Stool, Change in Bowel Habits, Chronic diarrhea, Constipation, Difficulty Swallowing, Excessive gas, Gets full quickly at meals, Hemorrhoids, Indigestion, Nausea, Rectal Pain and Vomiting. Female Genitourinary Not Present- Frequency, Nocturia, Painful Urination, Pelvic Pain and Urgency. Musculoskeletal Not Present- Back Pain, Joint Pain, Joint Stiffness, Muscle Pain, Muscle Weakness and Swelling of Extremities. Neurological Not Present- Decreased Memory, Fainting, Headaches, Numbness, Seizures, Tingling, Tremor, Trouble walking and Weakness. Psychiatric Not Present- Anxiety, Bipolar, Change in Sleep Pattern, Depression, Fearful and Frequent crying. Endocrine Not Present- Cold Intolerance, Excessive Hunger, Hair Changes, Heat Intolerance, Hot flashes and New Diabetes. Hematology Not Present- Blood Thinners, Easy Bruising, Excessive bleeding, Gland problems, HIV and Persistent Infections.  Vitals  Weight: 184.8 lb Height: 60in Body Surface Area: 1.8 m Body Mass Index: 36.09 kg/m  Temp.: 97.52F(Temporal)  Pulse: 89  (Regular)  BP: 144/88 (Sitting, Left Arm, Standard)  Physical Exam  General Mental Status-Alert. General Appearance-Consistent with stated age. Hydration-Well hydrated. Voice-Normal.  Head and Neck Head-normocephalic, atraumatic with no lesions or palpable masses. Trachea-midline. Thyroid Gland Characteristics - normal size and consistency.  Eye Eyeball - Bilateral-Extraocular movements intact. Sclera/Conjunctiva - Bilateral-No scleral icterus.  Chest and Lung Exam Chest and lung exam reveals -quiet, even and easy respiratory effort with no use of accessory muscles and on auscultation, normal breath sounds, no adventitious sounds and normal vocal resonance. Inspection Chest Wall - Normal. Back - normal.  Breast Note: There is no palpable mass in either breast. There is no palpable axillary, supraclavicular, or cervical lymphadenopathy.   Cardiovascular Cardiovascular examination reveals -normal heart sounds, regular rate and rhythm with no murmurs and normal pedal pulses bilaterally.  Abdomen Inspection Inspection of the abdomen reveals - No Hernias. Skin - Scar - no surgical scars. Palpation/Percussion Palpation and Percussion of the abdomen reveal - Soft, Non Tender, No Rebound tenderness, No Rigidity (guarding) and No hepatosplenomegaly. Auscultation Auscultation of the abdomen reveals - Bowel sounds normal.  Neurologic Neurologic evaluation reveals -alert and oriented x 3 with no impairment of recent or remote memory. Mental Status-Normal.  Musculoskeletal Normal Exam - Left-Upper Extremity Strength Normal and Lower Extremity Strength Normal. Normal Exam - Right-Upper Extremity Strength Normal and Lower Extremity Strength Normal.  Lymphatic Head & Neck  General Head & Neck Lymphatics: Bilateral - Description - Normal. Axillary  General Axillary Region: Bilateral - Description - Normal. Tenderness - Non Tender. Femoral  & Inguinal  Generalized Femoral & Inguinal Lymphatics: Bilateral - Description - Normal. Tenderness - Non Tender.    Assessment & Plan  SCLEROSING ADENOSIS OF BREAST, RIGHT (N60.21) Impression: The patient appears to have a 6 mm area of a complex sclerosing lesion in the lower inner right breast. Because there is a 5-10% chance of missing something more significant I think it would be reasonable to remove this area. She would also like to have this done. I have discussed with her in detail the risks and benefits of the operation as well as some of the technical aspects including the use of radioactive seed for localization and she understands and wishes to proceed Current Plans Pt Education - Breast Diseases: discussed with patient and provided information.

## 2019-10-23 NOTE — Op Note (Signed)
10/23/2019  12:31 PM  PATIENT:  Judy [name]  78 y.o. female  PRE-OPERATIVE DIAGNOSIS:  RIGHT BREAST COMPLEX SCLEROSING LESION  POST-OPERATIVE DIAGNOSIS:  RIGHT BREAST COMPLEX SCLEROSING LESION  PROCEDURE:  Procedure(s): RIGHT BREAST LUMPECTOMY WITH RADIOACTIVE SEED LOCALIZATION (Right)  SURGEON:  Surgeon(s) and Role:    * Jovita Kussmaul, MD - Primary  PHYSICIAN ASSISTANT:   ASSISTANTS: none   ANESTHESIA:   local and general  EBL:  minimal   BLOOD ADMINISTERED:none  DRAINS: none   LOCAL MEDICATIONS USED:  MARCAINE     SPECIMEN:  Source of Specimen:  right breast tissue  DISPOSITION OF SPECIMEN:  PATHOLOGY  COUNTS:  YES  TOURNIQUET:  * No tourniquets in log *  DICTATION: .Dragon Dictation   After informed consent was obtained the patient was brought to the operating room and placed in the supine position on the operating table.  After adequate induction of general anesthesia the patient's right breast was prepped with ChloraPrep, allowed to dry, and draped in usual sterile manner.  An appropriate timeout was performed.  Previously an I-125 seed was placed in the lower inner right breast to mark an area of complex sclerosing lesion.  The neoprobe was set to I-125 in the area of radioactivity was readily identified.  The area was far enough in the lower inner portion that I was able to make a small incision along the inframammary fold closest to the area.  This was done after infiltrating the area around it with quarter percent Marcaine.  The incision was carried through the skin and subcutaneous tissue sharply with the electrocautery until the dissection reached the chest wall.  The dissection was then carried superiorly between the breast tissue and the chest wall until we were well beyond the area of the radioactive seed.  Next the dissection was carried out between the breast tissue and the skin and subcutaneous tissue in the area overlying the radioactive seed.  Once we  were well beyond the area of the radioactive seed I then removed a wedge of breast tissue sharply with the electrocautery around the radioactive seed while checking the area of radioactivity frequently.  During the dissection I did encounter the seed and sent this to pathology in a separate container.  Once the specimen was removed it was oriented with the appropriate paint colors.  A specimen radiograph was obtained that showed the clip to be in the center of the specimen.  The specimen was then sent to pathology for further evaluation.  Hemostasis was achieved using the Bovie electrocautery.  The wound was irrigated with saline and infiltrated with more quarter percent Marcaine.  The deep layer of the wound was then closed with layers of interrupted 3-0 Vicryl stitches.  The skin was then closed with a running 4-0 Monocryl subcuticular stitch.  Dermabond dressings were applied.  The patient tolerated the procedure well.  At the end of the case all needle sponge and instrument counts were correct.  The patient was then awakened and taken to recovery in stable condition.  PLAN OF CARE: Discharge to home after PACU  PATIENT DISPOSITION:  PACU - hemodynamically stable.   Delay start of Pharmacological VTE agent (>24hrs) due to surgical blood loss or risk of bleeding: not applicable

## 2019-10-23 NOTE — Anesthesia Preprocedure Evaluation (Addendum)
Anesthesia Evaluation  Patient identified by MRN, date of birth, ID band Patient awake    Reviewed: Allergy & Precautions, NPO status , Patient's Chart, lab work & pertinent test results  History of Anesthesia Complications (+) history of anesthetic complications  Airway Mallampati: II   Neck ROM: full    Dental   Pulmonary sleep apnea ,    breath sounds clear to auscultation       Cardiovascular hypertension,  Rhythm:regular Rate:Normal     Neuro/Psych PSYCHIATRIC DISORDERS Anxiety Depression    GI/Hepatic   Endo/Other  diabetes  Renal/GU Renal InsufficiencyRenal disease     Musculoskeletal   Abdominal   Peds  Hematology   Anesthesia Other Findings   Reproductive/Obstetrics                             Anesthesia Physical  Anesthesia Plan  ASA: III  Anesthesia Plan: General   Post-op Pain Management:    Induction: Intravenous  PONV Risk Score and Plan: 4 or greater and Ondansetron, Dexamethasone and Treatment may vary due to age or medical condition  Airway Management Planned: LMA  Additional Equipment: None  Intra-op Plan:   Post-operative Plan: Extubation in OR  Informed Consent: I have reviewed the patients History and Physical, chart, labs and discussed the procedure including the risks, benefits and alternatives for the proposed anesthesia with the patient or authorized representative who has indicated his/her understanding and acceptance.     Dental advisory given  Plan Discussed with: CRNA  Anesthesia Plan Comments: Karoline Caldwell, [place], note: Anesthesia history: Pt reports "respiratory arrest" during gallbladder surgery in 2003. Review of anesthesia records and medical records from that time indicate pt experienced severe hypotension about 20 minutes into a lap chole procedure on 07/13/02 that did not respond to dopamine, epinephrine, Neo, fluid resuscitation or  trendelenburg. Surgery was aborted, cardiology consulted. Pt transferred to ICU. Notes from cardiology indicate hypotension likely due to hypovolemic shock; apparently pre-op hgb was normal but intraop hgb was 8. No source of bleeding identified, notes question possible GI bleed. Pt received blood transfusion. Per discharge summary, no definitive cause of hypotension identified. Pt recovered over course of several hours and 2 days later she returned to the OR to complete cholecystectomy. Intubation from this surgery was difficult. Notes state that on 4th attempt, nasotracheal intubation was successful using 7.5 tube.  MAC3 x2 and bougie attempts unsuccessful.   Pt had lumbar fusion in 0000000 without complication. Glidescope was used due to hx of difficult intubation and limited oral opening.  Preoperative labs reviewed, A1c 7.6 and CBG 204 c/w pt's DMII. Otherwise WNL  EKG 09/18/19: NSR. Rate 78  Echo 03/23/16:  - Left ventricle: The cavity size was normal. Wall thickness wasnormal. Systolic function was normal. The estimated ejectionfraction was in the range of 60% to 65%. Wall motion was normal;there were no regional wall motion abnormalities. Dopplerparameters are consistent with abnormal left ventricularrelaxation (grade 1 diastolic dysfunction). - Aortic valve: There was no stenosis. - Mitral valve: There was no significant regurgitation. - Right ventricle: The cavity size was normal. Systolic functionwas normal. - Pulmonary arteries: No complete TR doppler jet so unable toestimate PA systolic pressure. - Inferior vena cava: The vessel was normal in size. The respirophasic diameter changes were in the normal range (>= 50%), consistent with normal central venous pressure. )        Anesthesia Quick Evaluation

## 2019-10-25 NOTE — Anesthesia Postprocedure Evaluation (Signed)
Anesthesia Post Note  Patient: Judy [name]  Procedure(s) Performed: RIGHT BREAST LUMPECTOMY WITH RADIOACTIVE SEED LOCALIZATION (Right Breast)     Patient location during evaluation: PACU Anesthesia Type: General Level of consciousness: sedated and patient cooperative Pain management: pain level controlled Vital Signs Assessment: post-procedure vital signs reviewed and stable Respiratory status: spontaneous breathing Cardiovascular status: stable Anesthetic complications: no    Last Vitals:  Vitals:   10/23/19 1343 10/23/19 1345  BP: (!) 119/58 (!) 119/58  Pulse: 73 73  Resp: 10 20  Temp:  36.4 C  SpO2: 92% 96%    Last Pain:  Vitals:   10/23/19 1345  TempSrc:   PainSc: 0-No pain                 Nolon Nations

## 2019-10-26 ENCOUNTER — Encounter (HOSPITAL_COMMUNITY): Payer: Self-pay | Admitting: General Surgery

## 2019-10-26 LAB — SURGICAL PATHOLOGY

## 2019-12-03 ENCOUNTER — Other Ambulatory Visit: Payer: Self-pay

## 2019-12-03 ENCOUNTER — Inpatient Hospital Stay (HOSPITAL_COMMUNITY)
Admission: EM | Admit: 2019-12-03 | Discharge: 2019-12-10 | DRG: 177 | Disposition: A | Discharge status: Home Health | Payer: Medicare Other | Source: Ambulatory Visit | Attending: Internal Medicine | Admitting: Internal Medicine

## 2019-12-03 ENCOUNTER — Emergency Department (HOSPITAL_COMMUNITY): Payer: Medicare Other

## 2019-12-03 ENCOUNTER — Encounter (HOSPITAL_COMMUNITY): Payer: Self-pay | Admitting: Internal Medicine

## 2019-12-03 DIAGNOSIS — Z6833 Body mass index (BMI) 33.0-33.9, adult: Secondary | ICD-10-CM

## 2019-12-03 DIAGNOSIS — Z79899 Other long term (current) drug therapy: Secondary | ICD-10-CM

## 2019-12-03 DIAGNOSIS — G8929 Other chronic pain: Secondary | ICD-10-CM | POA: Diagnosis present

## 2019-12-03 DIAGNOSIS — F419 Anxiety disorder, unspecified: Secondary | ICD-10-CM | POA: Diagnosis present

## 2019-12-03 DIAGNOSIS — J1289 Other viral pneumonia: Secondary | ICD-10-CM

## 2019-12-03 DIAGNOSIS — J1282 Pneumonia due to coronavirus disease 2019: Secondary | ICD-10-CM | POA: Diagnosis present

## 2019-12-03 DIAGNOSIS — E875 Hyperkalemia: Secondary | ICD-10-CM

## 2019-12-03 DIAGNOSIS — M549 Dorsalgia, unspecified: Secondary | ICD-10-CM | POA: Diagnosis present

## 2019-12-03 DIAGNOSIS — R0600 Dyspnea, unspecified: Secondary | ICD-10-CM

## 2019-12-03 DIAGNOSIS — I10 Essential (primary) hypertension: Secondary | ICD-10-CM | POA: Diagnosis not present

## 2019-12-03 DIAGNOSIS — Z87442 Personal history of urinary calculi: Secondary | ICD-10-CM | POA: Diagnosis not present

## 2019-12-03 DIAGNOSIS — G4733 Obstructive sleep apnea (adult) (pediatric): Secondary | ICD-10-CM | POA: Diagnosis present

## 2019-12-03 DIAGNOSIS — E861 Hypovolemia: Secondary | ICD-10-CM | POA: Diagnosis present

## 2019-12-03 DIAGNOSIS — M109 Gout, unspecified: Secondary | ICD-10-CM | POA: Diagnosis present

## 2019-12-03 DIAGNOSIS — U071 COVID-19: Principal | ICD-10-CM

## 2019-12-03 DIAGNOSIS — N179 Acute kidney failure, unspecified: Secondary | ICD-10-CM | POA: Diagnosis present

## 2019-12-03 DIAGNOSIS — Z66 Do not resuscitate: Secondary | ICD-10-CM | POA: Diagnosis present

## 2019-12-03 DIAGNOSIS — F329 Major depressive disorder, single episode, unspecified: Secondary | ICD-10-CM | POA: Diagnosis present

## 2019-12-03 DIAGNOSIS — I129 Hypertensive chronic kidney disease with stage 1 through stage 4 chronic kidney disease, or unspecified chronic kidney disease: Secondary | ICD-10-CM | POA: Diagnosis present

## 2019-12-03 DIAGNOSIS — J9601 Acute respiratory failure with hypoxia: Secondary | ICD-10-CM | POA: Diagnosis present

## 2019-12-03 DIAGNOSIS — F32A Depression, unspecified: Secondary | ICD-10-CM | POA: Diagnosis present

## 2019-12-03 DIAGNOSIS — T380X5A Adverse effect of glucocorticoids and synthetic analogues, initial encounter: Secondary | ICD-10-CM | POA: Diagnosis present

## 2019-12-03 DIAGNOSIS — E871 Hypo-osmolality and hyponatremia: Secondary | ICD-10-CM | POA: Diagnosis present

## 2019-12-03 DIAGNOSIS — E669 Obesity, unspecified: Secondary | ICD-10-CM | POA: Diagnosis present

## 2019-12-03 DIAGNOSIS — E1165 Type 2 diabetes mellitus with hyperglycemia: Secondary | ICD-10-CM | POA: Diagnosis present

## 2019-12-03 DIAGNOSIS — N1831 Chronic kidney disease, stage 3a: Secondary | ICD-10-CM | POA: Diagnosis present

## 2019-12-03 DIAGNOSIS — E1122 Type 2 diabetes mellitus with diabetic chronic kidney disease: Secondary | ICD-10-CM | POA: Diagnosis present

## 2019-12-03 DIAGNOSIS — Z7984 Long term (current) use of oral hypoglycemic drugs: Secondary | ICD-10-CM | POA: Diagnosis not present

## 2019-12-03 DIAGNOSIS — N183 Chronic kidney disease, stage 3 unspecified: Secondary | ICD-10-CM | POA: Diagnosis not present

## 2019-12-03 DIAGNOSIS — R0902 Hypoxemia: Secondary | ICD-10-CM

## 2019-12-03 DIAGNOSIS — M48062 Spinal stenosis, lumbar region with neurogenic claudication: Secondary | ICD-10-CM | POA: Diagnosis present

## 2019-12-03 HISTORY — DX: COVID-19: U07.1

## 2019-12-03 LAB — CBC WITH DIFFERENTIAL/PLATELET
Abs Immature Granulocytes: 0.18 10*3/uL — ABNORMAL HIGH (ref 0.00–0.07)
Basophils Absolute: 0 10*3/uL (ref 0.0–0.1)
Basophils Relative: 0 %
Eosinophils Absolute: 0 10*3/uL (ref 0.0–0.5)
Eosinophils Relative: 0 %
HCT: 40.5 % (ref 36.0–46.0)
Hemoglobin: 13.4 g/dL (ref 12.0–15.0)
Immature Granulocytes: 2 %
Lymphocytes Relative: 9 %
Lymphs Abs: 0.9 10*3/uL (ref 0.7–4.0)
MCH: 29.8 pg (ref 26.0–34.0)
MCHC: 33.1 g/dL (ref 30.0–36.0)
MCV: 90.2 fL (ref 80.0–100.0)
Monocytes Absolute: 0.4 10*3/uL (ref 0.1–1.0)
Monocytes Relative: 3 %
Neutro Abs: 8.8 10*3/uL — ABNORMAL HIGH (ref 1.7–7.7)
Neutrophils Relative %: 86 %
Platelets: 272 10*3/uL (ref 150–400)
RBC: 4.49 MIL/uL (ref 3.87–5.11)
RDW: 12.5 % (ref 11.5–15.5)
WBC: 10.2 10*3/uL (ref 4.0–10.5)
nRBC: 0 % (ref 0.0–0.2)

## 2019-12-03 LAB — COMPREHENSIVE METABOLIC PANEL
ALT: 25 U/L (ref 0–44)
AST: 41 U/L (ref 15–41)
Albumin: 2.9 g/dL — ABNORMAL LOW (ref 3.5–5.0)
Alkaline Phosphatase: 52 U/L (ref 38–126)
Anion gap: 12 (ref 5–15)
BUN: 29 mg/dL — ABNORMAL HIGH (ref 8–23)
CO2: 24 mmol/L (ref 22–32)
Calcium: 8.8 mg/dL — ABNORMAL LOW (ref 8.9–10.3)
Chloride: 93 mmol/L — ABNORMAL LOW (ref 98–111)
Creatinine, Ser: 1.45 mg/dL — ABNORMAL HIGH (ref 0.44–1.00)
GFR calc Af Amer: 40 mL/min — ABNORMAL LOW (ref >60)
GFR calc non Af Amer: 35 mL/min — ABNORMAL LOW (ref >60)
Glucose, Bld: 138 mg/dL — ABNORMAL HIGH (ref 70–99)
Potassium: 5.7 mmol/L — ABNORMAL HIGH (ref 3.5–5.1)
Sodium: 129 mmol/L — ABNORMAL LOW (ref 135–145)
Total Bilirubin: 0.6 mg/dL (ref 0.3–1.2)
Total Protein: 6.9 g/dL (ref 6.5–8.1)

## 2019-12-03 LAB — POC SARS CORONAVIRUS 2 AG -  ED: SARS Coronavirus 2 Ag: POSITIVE — AB

## 2019-12-03 LAB — CBC
HCT: 37.2 % (ref 36.0–46.0)
Hemoglobin: 12.5 g/dL (ref 12.0–15.0)
MCH: 30 pg (ref 26.0–34.0)
MCHC: 33.6 g/dL (ref 30.0–36.0)
MCV: 89.2 fL (ref 80.0–100.0)
Platelets: 274 10*3/uL (ref 150–400)
RBC: 4.17 MIL/uL (ref 3.87–5.11)
RDW: 12.3 % (ref 11.5–15.5)
WBC: 10.4 10*3/uL (ref 4.0–10.5)
nRBC: 0 % (ref 0.0–0.2)

## 2019-12-03 LAB — CREATININE, SERUM
Creatinine, Ser: 1.43 mg/dL — ABNORMAL HIGH (ref 0.44–1.00)
GFR calc Af Amer: 41 mL/min — ABNORMAL LOW (ref >60)
GFR calc non Af Amer: 35 mL/min — ABNORMAL LOW (ref >60)

## 2019-12-03 LAB — CBG MONITORING, ED
Glucose-Capillary: 130 mg/dL — ABNORMAL HIGH (ref 70–99)
Glucose-Capillary: 121 mg/dL — ABNORMAL HIGH (ref 70–99)

## 2019-12-03 LAB — ABO/RH: ABO/RH(D): O POS

## 2019-12-03 LAB — HEMOGLOBIN A1C
Hgb A1c MFr Bld: 8.1 % — ABNORMAL HIGH (ref 4.8–5.6)
Mean Plasma Glucose: 185.77 mg/dL

## 2019-12-03 LAB — FIBRINOGEN: Fibrinogen: 784 mg/dL — ABNORMAL HIGH (ref 210–475)

## 2019-12-03 LAB — LACTATE DEHYDROGENASE: LDH: 382 U/L — ABNORMAL HIGH (ref 98–192)

## 2019-12-03 LAB — LACTIC ACID, PLASMA: Lactic Acid, Venous: 1.8 mmol/L (ref 0.5–1.9)

## 2019-12-03 LAB — TRIGLYCERIDES: Triglycerides: 115 mg/dL (ref <150)

## 2019-12-03 LAB — D-DIMER, QUANTITATIVE: D-Dimer, Quant: 3.24 ug/mL-FEU — ABNORMAL HIGH (ref 0.00–0.50)

## 2019-12-03 LAB — POTASSIUM: Potassium: 4.3 mmol/L (ref 3.5–5.1)

## 2019-12-03 LAB — C-REACTIVE PROTEIN: CRP: 16.8 mg/dL — ABNORMAL HIGH (ref <1.0)

## 2019-12-03 LAB — PROCALCITONIN: Procalcitonin: 0.2 ng/mL

## 2019-12-03 LAB — FERRITIN: Ferritin: 652 ng/mL — ABNORMAL HIGH (ref 11–307)

## 2019-12-03 MED ORDER — ONDANSETRON HCL 4 MG PO TABS
4.0000 mg | ORAL_TABLET | Freq: Four times a day (QID) | ORAL | Status: DC | PRN
  Administered 2019-12-09: 4 mg via ORAL
  Filled 2019-12-03: qty 1

## 2019-12-03 MED ORDER — SODIUM CHLORIDE 0.9 % IV SOLN
100.0000 mg | Freq: Every day | INTRAVENOUS | Status: AC
  Administered 2019-12-04 – 2019-12-07 (×4): 100 mg via INTRAVENOUS
  Filled 2019-12-03 (×4): qty 20

## 2019-12-03 MED ORDER — ALBUTEROL SULFATE HFA 108 (90 BASE) MCG/ACT IN AERS
2.0000 | INHALATION_SPRAY | Freq: Once | RESPIRATORY_TRACT | Status: AC
  Administered 2019-12-03: 2 via RESPIRATORY_TRACT
  Filled 2019-12-03: qty 6.7

## 2019-12-03 MED ORDER — SODIUM ZIRCONIUM CYCLOSILICATE 10 G PO PACK: 10.0000 g | PACK | Freq: Every day | ORAL | Status: DC

## 2019-12-03 MED ORDER — CLONAZEPAM 0.5 MG PO TABS
0.5000 mg | ORAL_TABLET | Freq: Every evening | ORAL | Status: DC | PRN
  Administered 2019-12-05 – 2019-12-09 (×3): 0.5 mg via ORAL
  Filled 2019-12-03 (×3): qty 1

## 2019-12-03 MED ORDER — ACETAMINOPHEN 325 MG PO TABS: 650.0000 mg | ORAL_TABLET | Freq: Four times a day (QID) | ORAL | Status: DC | PRN

## 2019-12-03 MED ORDER — SODIUM ZIRCONIUM CYCLOSILICATE 10 G PO PACK
10.0000 g | PACK | Freq: Once | ORAL | Status: DC
  Filled 2019-12-03: qty 1

## 2019-12-03 MED ORDER — INSULIN ASPART 100 UNIT/ML ~~LOC~~ SOLN
0.0000 [IU] | Freq: Three times a day (TID) | SUBCUTANEOUS | Status: DC
  Administered 2019-12-03: 2 [IU] via SUBCUTANEOUS
  Administered 2019-12-04: 8 [IU] via SUBCUTANEOUS
  Filled 2019-12-03: qty 0.15

## 2019-12-03 MED ORDER — TOCILIZUMAB 400 MG/20ML IV SOLN
600.0000 mg | Freq: Once | INTRAVENOUS | Status: AC
  Administered 2019-12-03: 600 mg via INTRAVENOUS
  Filled 2019-12-03: qty 10

## 2019-12-03 MED ORDER — SODIUM CHLORIDE 0.9 % IV SOLN: INTRAVENOUS | Status: AC

## 2019-12-03 MED ORDER — SODIUM CHLORIDE 0.9 % IV SOLN
200.0000 mg | Freq: Once | INTRAVENOUS | Status: AC
  Administered 2019-12-03: 200 mg via INTRAVENOUS
  Filled 2019-12-03: qty 200

## 2019-12-03 MED ORDER — ONDANSETRON HCL 4 MG/2ML IJ SOLN: 4.0000 mg | Freq: Four times a day (QID) | INTRAMUSCULAR | Status: DC | PRN

## 2019-12-03 MED ORDER — HYDROCODONE-ACETAMINOPHEN 5-325 MG PO TABS: 1.0000 | ORAL_TABLET | Freq: Four times a day (QID) | ORAL | Status: DC | PRN

## 2019-12-03 MED ORDER — DILTIAZEM HCL ER COATED BEADS 120 MG PO CP24
240.0000 mg | ORAL_CAPSULE | Freq: Every day | ORAL | Status: DC
  Administered 2019-12-04 – 2019-12-10 (×7): 240 mg via ORAL
  Filled 2019-12-03 (×7): qty 2

## 2019-12-03 MED ORDER — METHYLPREDNISOLONE SODIUM SUCC 125 MG IJ SOLR
60.0000 mg | Freq: Two times a day (BID) | INTRAMUSCULAR | Status: DC
  Administered 2019-12-03 – 2019-12-04 (×2): 60 mg via INTRAVENOUS
  Filled 2019-12-03 (×2): qty 2

## 2019-12-03 MED ORDER — SODIUM CHLORIDE 0.9 % IV SOLN: INTRAVENOUS | Status: DC

## 2019-12-03 MED ORDER — ALBUTEROL SULFATE HFA 108 (90 BASE) MCG/ACT IN AERS
2.0000 | INHALATION_SPRAY | Freq: Four times a day (QID) | RESPIRATORY_TRACT | Status: DC
  Administered 2019-12-03 – 2019-12-10 (×25): 2 via RESPIRATORY_TRACT
  Filled 2019-12-03 (×3): qty 6.7

## 2019-12-03 MED ORDER — HEPARIN SODIUM (PORCINE) 5000 UNIT/ML IJ SOLN
5000.0000 [IU] | Freq: Three times a day (TID) | INTRAMUSCULAR | Status: DC
  Administered 2019-12-03 – 2019-12-04 (×3): 5000 [IU] via SUBCUTANEOUS
  Filled 2019-12-03 (×3): qty 1

## 2019-12-03 NOTE — ED Notes (Signed)
Attempted 2nd IV in right AC, was able to obtain blood cultures, unable to get an IV. Pt tolerated well.

## 2019-12-03 NOTE — ED Provider Notes (Addendum)
[facility] DEPT Provider Note   CSN: [id number] Arrival date & time: 12/03/19  1351     History Chief Complaint  Patient presents with  . COVID Positive  . Shortness of Breath    Judy [name] is a 78 y.o. female presenting for evaluation of shortness of breath.  Patient states she tested positive for Covid 9 days ago.  She has had worsening shortness of breath, prompting an urgent care visit.  At urgent care, she is found to be hypoxic, and transported to the ER.  Patient states she has worsening shortness of breath when she talks and when she moves.  She does not wear oxygen during the day, but does wear a CPAP mask at night for OSA.  She reports a history of anxiety, depression, diabetes, hypertension for which she takes medication.  No history of lung disease.  She states initially she was coughing, but this is mostly resolved.  She denies fevers, chills, chest pain, nausea, vomiting, abdominal pain, urinary symptoms.  Patient states she has had no appetite, and had little to nothing to eat in the past 5 days due to decreased appetite and poor taste.  She has continued to drink water without difficulty.  As such, patient reports decreased bowel mvts.  HPI     Past Medical History:  Diagnosis Date  . Anxiety   . Back pain   . Complication of anesthesia 2003   gallbladder surgery-resp. arrest-stop operation and pt. went to ICU - patient stated she has back surgery since then and had no trouble at all  . Depression   . Diabetes ([place])   . Dyspnea   . Gout   . History of kidney stones   . Hypertension   . Leg edema   . Pancreatitis, chronic ([place]) 03/2016  . Sleep apnea     Patient Active Problem List   Diagnosis Date Noted  . Class 1 obesity with serious comorbidity and body mass index (BMI) of 33.0 to 33.9 in adult 12/25/2018  . Other fatigue 01/20/2018  . Shortness of breath on exertion 01/20/2018  . Type 2 diabetes mellitus with diabetic  chronic kidney disease ([place]) 01/20/2018  . Vitamin D deficiency 01/20/2018  . B12 nutritional deficiency 01/20/2018  . Obstructive sleep apnea 11/04/2017  . Dyspnea on exertion 11/04/2017  . Lumbar stenosis with neurogenic claudication 11/19/2016  . Pancreatitis 03/21/2016  . HTN (hypertension) 03/21/2016  . Anxiety   . Depression   . AKI (acute kidney injury) [facility])     Past Surgical History:  Procedure Laterality Date  . BACK SURGERY    . BREAST LUMPECTOMY WITH RADIOACTIVE SEED LOCALIZATION Right 10/23/2019   Procedure: RIGHT BREAST LUMPECTOMY WITH RADIOACTIVE SEED LOCALIZATION;  Surgeon: Jovita Kussmaul, MD;  Location: [facility];  Service: General;  Laterality: Right;  . CHOLECYSTECTOMY    . COLONOSCOPY  2013     OB History    Gravida  1   Para      Term      Preterm      AB      Living  1     SAB      TAB      Ectopic      Multiple      Live Births              Family History  Problem Relation Age of Onset  . Dementia Mother   . AAA (abdominal aortic aneurysm) Father   .  Breast cancer Maternal Aunt     Social History   Tobacco Use  . Smoking status: Never Smoker  . Smokeless tobacco: Never Used  Substance Use Topics  . Alcohol use: No    Alcohol/week: 0.0 standard drinks  . Drug use: No    Home Medications Prior to Admission medications   Medication Sig Start Date End Date Taking? Authorizing Provider  clonazePAM (KLONOPIN) 0.5 MG tablet Take 0.5 mg by mouth at bedtime. 08/21/19   [provider]  diltiazem (CARDIZEM CD) 240 MG 24 hr capsule Take 240 mg by mouth daily.    [provider]  etodolac (LODINE) 400 MG tablet Take 400 mg by mouth daily as needed for moderate pain.    [provider]  glucose blood (ACCU-CHEK AVIVA) test strip Test twice daily 12/18/18   Whitmire, Arrie Aran W, FNP  HYDROcodone-acetaminophen (NORCO/VICODIN) 5-325 MG tablet Take 1-2 tablets by mouth every 6 (six) hours as needed for moderate pain or  severe pain. 10/23/19   Autumn Messing III, MD  imipramine (TOFRANIL) 50 MG tablet Take 50 mg by mouth at bedtime.    [provider]  Lancets (ACCU-CHEK SAFE-T PRO) lancets Test twice daily 01/14/19   Whitmire, Arrie Aran W, FNP  metFORMIN (GLUCOPHAGE) 500 MG tablet Take 1 tablet (500 mg total) by mouth 3 (three) times daily. Patient taking differently: Take 250-500 mg by mouth See admin instructions. Take 250 mg by mouth in the morning and 500 mg at night 06/19/18   Dennard Nip D, MD  NON FORMULARY 1 each by Other route See admin instructions. CPAP machine nightly    [provider]  spironolactone (ALDACTONE) 50 MG tablet Take 50 mg by mouth daily.     [provider]    Allergies    Patient has no known allergies.  Review of Systems   Review of Systems  Constitutional: Positive for appetite change.  Respiratory: Positive for cough and shortness of breath.   All other systems reviewed and are negative.   Physical Exam Updated Vital Signs BP 119/66   Pulse 82   Temp 99.1 F (37.3 C) (Oral)   Resp (!) 27   SpO2 93%   Physical Exam Vitals and nursing note reviewed.  Constitutional:      General: She is not in acute distress.    Appearance: She is well-developed. She is ill-appearing.  HENT:     Head: Normocephalic and atraumatic.  Eyes:     Extraocular Movements: Extraocular movements intact.     Conjunctiva/sclera: Conjunctivae normal.     Pupils: Pupils are equal, round, and reactive to light.  Cardiovascular:     Rate and Rhythm: Normal rate and regular rhythm.     Pulses: Normal pulses.  Pulmonary:     Effort: Tachypnea and accessory muscle usage present.     Breath sounds: Rhonchi present.     Comments: Coarse lung sounds bilaterally.  Sats stable on 4 L via nasal cannula.  Speaking in 2-3 word sentences with slight increased accessory muscle use. Abdominal:     General: There is no distension.     Palpations: Abdomen is soft. There is no mass.      Tenderness: There is no abdominal tenderness. There is no guarding or rebound.  Musculoskeletal:        General: Normal range of motion.     Cervical back: Normal range of motion and neck supple.     Right lower leg: No edema.  Left lower leg: No edema.     Comments: No leg pain or swelling.  Skin:    General: Skin is warm and dry.     Capillary Refill: Capillary refill takes less than 2 seconds.  Neurological:     Mental Status: She is alert and oriented to person, place, and time.     ED Results / Procedures / Treatments   Labs (all labs ordered are listed, but only abnormal results are displayed) Labs Reviewed  CBC WITH DIFFERENTIAL/PLATELET - Abnormal; Notable for the following components:      Result Value   Neutro Abs 8.8 (*)    Abs Immature Granulocytes 0.18 (*)    All other components within normal limits  COMPREHENSIVE METABOLIC PANEL - Abnormal; Notable for the following components:   Sodium 129 (*)    Potassium 5.7 (*)    Chloride 93 (*)    Glucose, Bld 138 (*)    BUN 29 (*)    Creatinine, Ser 1.45 (*)    Calcium 8.8 (*)    Albumin 2.9 (*)    GFR calc non Af Amer 35 (*)    GFR calc Af Amer 40 (*)    All other components within normal limits  D-DIMER, QUANTITATIVE (NOT AT [facility]) - Abnormal; Notable for the following components:   D-Dimer, Quant 3.24 (*)    All other components within normal limits  LACTATE DEHYDROGENASE - Abnormal; Notable for the following components:   LDH 382 (*)    All other components within normal limits  FERRITIN - Abnormal; Notable for the following components:   Ferritin 652 (*)    All other components within normal limits  FIBRINOGEN - Abnormal; Notable for the following components:   Fibrinogen 784 (*)    All other components within normal limits  C-REACTIVE PROTEIN - Abnormal; Notable for the following components:   CRP 16.8 (*)    All other components within normal limits  POC SARS CORONAVIRUS 2 AG -  ED - Abnormal;  Notable for the following components:   SARS Coronavirus 2 Ag POSITIVE (*)    All other components within normal limits  CULTURE, BLOOD (ROUTINE X 2)  CULTURE, BLOOD (ROUTINE X 2)  PROCALCITONIN  TRIGLYCERIDES  LACTIC ACID, PLASMA  POTASSIUM    EKG None  Radiology DG Chest Port 1 View  Result Date: 12/03/2019 CLINICAL DATA:  Transported by [facility] from urgent care-- tested positive for COVID 9 days ago, seen at [facility] for exertional shob, weakness, fatigue. Room air sats read 88% and with 4 L of oxygen--94%. AAO x 4. From home. HTN EXAM: PORTABLE CHEST 1 VIEW COMPARISON:  Chest radiograph 11/04/2017 FINDINGS: Limited exam due to AP portable technique. Stable cardiomediastinal contours. There are opacities in the right upper and likely right lower lobe suspicious for infection. No definite infiltrate in the left lung. No pneumothorax or large pleural effusion. No acute finding in the visualized skeleton. IMPRESSION: Consolidative opacity within the right upper and likely right lower lung suspicious for infection. Electronically Signed   By: Audie Pinto M.D.   On: 12/03/2019 15:48    Procedures .Critical Care Performed by: Franchot Heidelberg, PA-C Authorized by: Franchot Heidelberg, PA-C   Critical care provider statement:    Critical care time (minutes):  40   Critical care time was exclusive of:  Separately billable procedures and treating other patients and teaching time   Critical care was necessary to treat or prevent imminent or life-threatening deterioration of the following conditions:  Respiratory failure and metabolic crisis   Critical care was time spent personally by me on the following activities:  Blood draw for specimens, development of treatment plan with patient or surrogate, evaluation of patient's response to treatment, examination of patient, obtaining history from patient or surrogate, ordering and performing treatments and interventions, ordering and review of  laboratory studies, ordering and review of radiographic studies, pulse oximetry, re-evaluation of patient's condition and review of old charts   I assumed direction of critical care for this patient from another provider in my specialty: no   Comments:     Patient hypoxic requiring oxygen and admission.  Additionally, hyperkalemic, received Lokelma and repeat labs.   (including critical care time)  Medications Ordered in ED Medications  sodium zirconium cyclosilicate (LOKELMA) packet 10 g (has no administration in time range)  albuterol (VENTOLIN HFA) 108 (90 Base) MCG/ACT inhaler 2 puff (2 puffs Inhalation Given 12/03/19 1450)    ED Course  I have reviewed the triage vital signs and the nursing notes.  Pertinent labs & imaging results that were available during my care of the patient were reviewed by me and considered in my medical decision making (see chart for details).    MDM Rules/Calculators/A&P                      Patient presented for evaluation of shortness of breath.  Physical exam shows patient who is speaking in short sentences with slight accessory muscle use.  Sats stable on O2.  As patient had a positive Covid diagnosis 9 days ago, this is likely the source of her shortness of breath.  As she has not had sudden onset worsening of symptoms, consider PE but lower suspicion at this time.  Will obtain x-rays and lab work. Patient will need to be admitted due to hypoxia.  Case discussed with attending, Dr. Darl Householder evaluated the patient.  cxr viewed and interpreted by me, shows pneumonia, likely due to Covid. Labs show increased inflammatory markers, once again consistent with Covid.  Patient also is hyperkalemic at 5.7.  Will redraw potassium to check for accuracy, however will give Lokelma in the meantime.  No concerning EKG changes.  Will call hospitalist for admission.  Discussed with Dr. Broadus John from [facility] hospitalist service, pt to be admitted.    Judy [name] was evaluated  in Emergency Department on 12/03/2019 for the symptoms described in the history of present illness. She was evaluated in the context of the global COVID-19 pandemic, which necessitated consideration that the patient might be at risk for infection with the SARS-CoV-2 virus that causes COVID-19. Institutional protocols and algorithms that pertain to the evaluation of patients at risk for COVID-19 are in a state of rapid change based on information released by regulatory bodies including the CDC and federal and state organizations. These policies and algorithms were followed during the patient's care in the ED.  Final Clinical Impression(s) / ED Diagnoses Final diagnoses:  COVID-19  Pneumonia due to COVID-19 virus  Hyperkalemia  Hypoxia    Rx / DC Orders ED Discharge Orders    None       Franchot Heidelberg, PA-C 12/03/19 Bradley, Tinsley Lomas, PA-C 12/03/19 1614    Drenda Freeze, MD 12/07/19 1100

## 2019-12-03 NOTE — ED Notes (Signed)
ED Provider at bedside. 

## 2019-12-03 NOTE — ED Notes (Signed)
Mini Lab: POC SARS Positive

## 2019-12-03 NOTE — ED Notes (Signed)
[facility] called for transport. [facility] states it will be after 7 until she will be transported.

## 2019-12-03 NOTE — H&P (Addendum)
History and Physical    Judy [name] [id number] DOB: 18-May-1941 DOA: 12/03/2019  Referring MD/NP/PA: EDP PCP:  Patient coming from: Home  Chief Complaint: Worsening shortness of breath  HPI: Judy [name] is a 78 y.o. female with medical history significant of type 2 diabetes mellitus, hypertension, chronic back pain, anxiety, depression, presents to the ED today with worsening shortness of breath for 3 to 4 days. -Patient reports that she was diagnosed with Covid 9 days ago, at that time she had malaise, anorexia, cough, this continued to worsen, she reports that she is hardly eaten anything over the last 6 days.  She lives with a roommate who has helped her during this time, during the last 4 days she has had worsening shortness of breath, chills, feeling feverish, has not checked her temperature, due to worsening dyspnea presented to the ED today. -She denies any vomiting or diarrhea ED Course: Noted to be hypoxic by EMS in the mid to high 80s on room air, required 4 L O2, to get sats above 90, currently on 5 L with O2 sats of 90 to 91% at rest, labs were notable for sodium of 129, potassium of 5.7, creatinine of 1.45, LDH of 382, ferritin of 650, CRP of 16, procalcitonin of 0.2, white count was normal at 10.8, D-dimer 3.2 and COVID-19 PCR was positive, chest x-ray shows no right-sided infiltrates  Review of Systems: As per HPI otherwise 14 point review of systems negative.   Past Medical History:  Diagnosis Date  . Anxiety   . Back pain   . Complication of anesthesia 2003   gallbladder surgery-resp. arrest-stop operation and pt. went to ICU - patient stated she has back surgery since then and had no trouble at all  . Depression   . Diabetes ([place])   . Dyspnea   . Gout   . History of kidney stones   . Hypertension   . Leg edema   . Pancreatitis, chronic ([place]) 03/2016  . Sleep apnea     Past Surgical History:  Procedure Laterality Date  . BACK SURGERY    . BREAST  LUMPECTOMY WITH RADIOACTIVE SEED LOCALIZATION Right 10/23/2019   Procedure: RIGHT BREAST LUMPECTOMY WITH RADIOACTIVE SEED LOCALIZATION;  Surgeon: Jovita Kussmaul, MD;  Location: [facility];  Service: General;  Laterality: Right;  . CHOLECYSTECTOMY    . COLONOSCOPY  2013     reports that she has never smoked. She has never used smokeless tobacco. She reports that she does not drink alcohol or use drugs.  No Known Allergies  Family History  Problem Relation Age of Onset  . Dementia Mother   . AAA (abdominal aortic aneurysm) Father   . Breast cancer Maternal Aunt      Prior to Admission medications   Medication Sig Start Date End Date Taking? Authorizing Provider  clonazePAM (KLONOPIN) 0.5 MG tablet Take 0.5 mg by mouth at bedtime. 08/21/19   [provider]  diltiazem (CARDIZEM CD) 240 MG 24 hr capsule Take 240 mg by mouth daily.    [provider]  etodolac (LODINE) 400 MG tablet Take 400 mg by mouth daily as needed for moderate pain.    [provider]  glucose blood (ACCU-CHEK AVIVA) test strip Test twice daily 12/18/18   Whitmire, Arrie Aran W, FNP  HYDROcodone-acetaminophen (NORCO/VICODIN) 5-325 MG tablet Take 1-2 tablets by mouth every 6 (six) hours as needed for moderate pain or severe pain. 10/23/19   Jovita Kussmaul, MD  imipramine (TOFRANIL)  50 MG tablet Take 50 mg by mouth at bedtime.    [provider]  Lancets (ACCU-CHEK SAFE-T PRO) lancets Test twice daily 01/14/19   Whitmire, Arrie Aran W, FNP  metFORMIN (GLUCOPHAGE) 500 MG tablet Take 1 tablet (500 mg total) by mouth 3 (three) times daily. Patient taking differently: Take 250-500 mg by mouth See admin instructions. Take 250 mg by mouth in the morning and 500 mg at night 06/19/18   Dennard Nip D, MD  NON FORMULARY 1 each by Other route See admin instructions. CPAP machine nightly    [provider]  spironolactone (ALDACTONE) 50 MG tablet Take 50 mg by mouth daily.     [provider]     Physical Exam: Vitals:   12/03/19 1448 12/03/19 1500 12/03/19 1606  BP: 111/61 119/66 123/66  Pulse: 82 82 80  Resp: (!) 28 (!) 27 (!) 25  Temp: 99.1 F (37.3 C)    TempSrc: Oral    SpO2: 93% 93% 90%      Constitutional: Obese pleasant female, sitting up in bed, alert awake oriented x3, with O2 on via nasal cannula, no distress, no accessory muscle use Vitals:   12/03/19 1448 12/03/19 1500 12/03/19 1606  BP: 111/61 119/66 123/66  Pulse: 82 82 80  Resp: (!) 28 (!) 27 (!) 25  Temp: 99.1 F (37.3 C)    TempSrc: Oral    SpO2: 93% 93% 90%   HEENT, oral mucosa is dry, neck no JVD Respiratory: Distant breath sounds, few basilar rales Cardiovascular: S1-S2, regular rhythm, tachycardic Abdomen: Soft obese, nontender, bowel sounds present Ext: No edema Skin: No obvious skin rashes Neurologic: Moves all extremities, no localizing signs Psychiatric: Pleasant, normal insight and judgment  Labs on Admission: I have personally reviewed following labs and imaging studies  CBC: Recent Labs  Lab 12/03/19 1450  WBC 10.2  NEUTROABS 8.8*  HGB 13.4  HCT 40.5  MCV 90.2  PLT [id number]   Basic Metabolic Panel: Recent Labs  Lab 12/03/19 1450  NA 129*  K 5.7*  CL 93*  CO2 24  GLUCOSE 138*  BUN 29*  CREATININE 1.45*  CALCIUM 8.8*   GFR: CrCl cannot be calculated (Unknown ideal weight.). Liver Function Tests: Recent Labs  Lab 12/03/19 1450  AST 41  ALT 25  ALKPHOS 52  BILITOT 0.6  PROT 6.9  ALBUMIN 2.9*   No results for input(s): LIPASE, AMYLASE in the last 168 hours. No results for input(s): AMMONIA in the last 168 hours. Coagulation Profile: No results for input(s): INR, PROTIME in the last 168 hours. Cardiac Enzymes: No results for input(s): CKTOTAL, CKMB, CKMBINDEX, TROPONINI in the last 168 hours. BNP (last 3 results) No results for input(s): PROBNP in the last 8760 hours. HbA1C: No results for input(s): HGBA1C in the last 72 hours. CBG: No results for  input(s): GLUCAP in the last 168 hours. Lipid Profile: Recent Labs    12/03/19 1450  TRIG 115   Thyroid Function Tests: No results for input(s): TSH, T4TOTAL, FREET4, T3FREE, THYROIDAB in the last 72 hours. Anemia Panel: Recent Labs    12/03/19 1450  FERRITIN 652*   Urine analysis:    Component Value Date/Time   COLORURINE YELLOW 03/21/2016 0145   APPEARANCEUR CLEAR 03/21/2016 0145   LABSPEC 1.018 03/21/2016 0145   PHURINE 5.0 03/21/2016 0145   GLUCOSEU NEGATIVE 03/21/2016 0145   HGBUR NEGATIVE 03/21/2016 0145   BILIRUBINUR NEGATIVE 03/21/2016 0145   KETONESUR NEGATIVE 03/21/2016 0145   PROTEINUR NEGATIVE 03/21/2016  0145   NITRITE POSITIVE (A) 03/21/2016 0145   LEUKOCYTESUR SMALL (A) 03/21/2016 0145   Sepsis Labs: @LABRCNTIP (procalcitonin:4,lacticidven:4) )No results found for this or any previous visit (from the past 240 hour(s)).   Radiological Exams on Admission: DG Chest Port 1 View  Result Date: 12/03/2019 CLINICAL DATA:  Transported by [facility] from urgent care-- tested positive for COVID 9 days ago, seen at [facility] for exertional shob, weakness, fatigue. Room air sats read 88% and with 4 L of oxygen--94%. AAO x 4. From home. HTN EXAM: PORTABLE CHEST 1 VIEW COMPARISON:  Chest radiograph 11/04/2017 FINDINGS: Limited exam due to AP portable technique. Stable cardiomediastinal contours. There are opacities in the right upper and likely right lower lobe suspicious for infection. No definite infiltrate in the left lung. No pneumothorax or large pleural effusion. No acute finding in the visualized skeleton. IMPRESSION: Consolidative opacity within the right upper and likely right lower lung suspicious for infection. Electronically Signed   By: Audie Pinto M.D.   On: 12/03/2019 15:48    EKG: Independently reviewed.  Sinus rhythm, low voltage complexes, normal QTC no acute ST-T wave changes  Assessment/Plan  Acute hypoxic respiratory failure COVID-19 pneumonia -Patient  presented 9 days after original diagnosis with worsening dyspnea, right-sided infiltrates, CRP of 16, elevated D-dimer/ferritin and hypoxemia requiring 5 L of oxygen at this time -Start IV remdesivir and Solu-Medrol -Actermra IV x1 -I discussed with this patient the rationale for tocilizumab (Actemra)-given considerable hypoxia requiring 5 L O2 at this time with significantly elevated inflammatory markers and infiltrates on x-ray at this time, discussed the off label use of this medication to stem the inflammatory cascade/cytokine storm, she does not have any known contraindications, no prior history of TB, or any known form of immunosuppression, agrees to using tocilizumab as needed. -Follow  CRP/ferritin/D-dimer daily -Procalcitonin is marginally elevated, if this worsens consider ceftriaxone/azithromycin in addition to above  Acute kidney injury Hyperkalemia -I suspect this is prerenal secondary to extremely limited p.o. intake in the last week, compounded by NSAID and Aldactone use -Start gentle IV fluids for 12 hours, Lokelma daily x2 doses -Hold Aldactone and NSAID  Diabetes mellitus type 2 -Check hemoglobin A1c, hold Metformin, sliding scale insulin -Monitor with IV steroids  Chronic pain/anxiety/depression -Continue home meds namely hydrocodone, low-dose benzo QHS  Hypertension -Blood pressure in the low 100s, hold Aldactone, monitor with hydration  DVT prophylaxis: Hep SQ Code Status: DNR per pts wishes Family Communication: no family at bedside, discussed plan with pt  Disposition Plan: admit to [place] called: none  Admission status: inpatient  Domenic Polite MD [facility]   12/03/2019, 4:32 PM

## 2019-12-03 NOTE — Plan of Care (Signed)
  Problem: Education: Goal: Knowledge of risk factors and measures for prevention of condition will improve Outcome: Progressing   Problem: Coping: Goal: Psychosocial and spiritual needs will be supported Outcome: Progressing   Problem: Respiratory: Goal: Will maintain a patent airway Outcome: Progressing Goal: Complications related to the disease process, condition or treatment will be avoided or minimized Outcome: Progressing   

## 2019-12-03 NOTE — ED Triage Notes (Signed)
Transported by [facility] from urgent care-- tested positive for COVID 9 days ago, seen at [facility] for exertional shob, weakness, fatigue. Room air sats read 88% and with 4 L of oxygen--94%. AAO x 4. From home.

## 2019-12-03 NOTE — ED Notes (Signed)
Admitting MD at bedside.

## 2019-12-03 NOTE — ED Notes (Signed)
[facility] paged due to pt O2 saturations 88% on 5L [place].

## 2019-12-04 DIAGNOSIS — J9601 Acute respiratory failure with hypoxia: Secondary | ICD-10-CM

## 2019-12-04 LAB — BASIC METABOLIC PANEL
Anion gap: 12 (ref 5–15)
Anion gap: 12 (ref 5–15)
BUN: 33 mg/dL — ABNORMAL HIGH (ref 8–23)
BUN: 31 mg/dL — ABNORMAL HIGH (ref 8–23)
CO2: 23 mmol/L (ref 22–32)
CO2: 20 mmol/L — ABNORMAL LOW (ref 22–32)
Calcium: 8.9 mg/dL (ref 8.9–10.3)
Calcium: 8.6 mg/dL — ABNORMAL LOW (ref 8.9–10.3)
Chloride: 96 mmol/L — ABNORMAL LOW (ref 98–111)
Chloride: 102 mmol/L (ref 98–111)
Creatinine, Ser: 1.47 mg/dL — ABNORMAL HIGH (ref 0.44–1.00)
Creatinine, Ser: 1.25 mg/dL — ABNORMAL HIGH (ref 0.44–1.00)
GFR calc Af Amer: 39 mL/min — ABNORMAL LOW (ref >60)
GFR calc Af Amer: 48 mL/min — ABNORMAL LOW (ref >60)
GFR calc non Af Amer: 34 mL/min — ABNORMAL LOW (ref >60)
GFR calc non Af Amer: 41 mL/min — ABNORMAL LOW (ref >60)
Glucose, Bld: 435 mg/dL — ABNORMAL HIGH (ref 70–99)
Glucose, Bld: 278 mg/dL — ABNORMAL HIGH (ref 70–99)
Potassium: 4.9 mmol/L (ref 3.5–5.1)
Potassium: 4.5 mmol/L (ref 3.5–5.1)
Sodium: 131 mmol/L — ABNORMAL LOW (ref 135–145)
Sodium: 134 mmol/L — ABNORMAL LOW (ref 135–145)

## 2019-12-04 LAB — CBC WITH DIFFERENTIAL/PLATELET
Abs Immature Granulocytes: 0.16 10*3/uL — ABNORMAL HIGH (ref 0.00–0.07)
Basophils Absolute: 0 10*3/uL (ref 0.0–0.1)
Basophils Relative: 0 %
Eosinophils Absolute: 0 10*3/uL (ref 0.0–0.5)
Eosinophils Relative: 0 %
HCT: 37.8 % (ref 36.0–46.0)
Hemoglobin: 12.8 g/dL (ref 12.0–15.0)
Immature Granulocytes: 2 %
Lymphocytes Relative: 10 %
Lymphs Abs: 0.9 10*3/uL (ref 0.7–4.0)
MCH: 29.9 pg (ref 26.0–34.0)
MCHC: 33.9 g/dL (ref 30.0–36.0)
MCV: 88.3 fL (ref 80.0–100.0)
Monocytes Absolute: 0.2 10*3/uL (ref 0.1–1.0)
Monocytes Relative: 2 %
Neutro Abs: 7.8 10*3/uL — ABNORMAL HIGH (ref 1.7–7.7)
Neutrophils Relative %: 86 %
Platelets: 291 10*3/uL (ref 150–400)
RBC: 4.28 MIL/uL (ref 3.87–5.11)
RDW: 12.2 % (ref 11.5–15.5)
WBC: 9 10*3/uL (ref 4.0–10.5)
nRBC: 0 % (ref 0.0–0.2)

## 2019-12-04 LAB — COMPREHENSIVE METABOLIC PANEL
ALT: 26 U/L (ref 0–44)
AST: 30 U/L (ref 15–41)
Albumin: 2.5 g/dL — ABNORMAL LOW (ref 3.5–5.0)
Alkaline Phosphatase: 56 U/L (ref 38–126)
Anion gap: 12 (ref 5–15)
BUN: 28 mg/dL — ABNORMAL HIGH (ref 8–23)
CO2: 22 mmol/L (ref 22–32)
Calcium: 8.2 mg/dL — ABNORMAL LOW (ref 8.9–10.3)
Chloride: 95 mmol/L — ABNORMAL LOW (ref 98–111)
Creatinine, Ser: 1.2 mg/dL — ABNORMAL HIGH (ref 0.44–1.00)
GFR calc Af Amer: 50 mL/min — ABNORMAL LOW (ref >60)
GFR calc non Af Amer: 44 mL/min — ABNORMAL LOW (ref >60)
Glucose, Bld: 232 mg/dL — ABNORMAL HIGH (ref 70–99)
Potassium: 4.3 mmol/L (ref 3.5–5.1)
Sodium: 129 mmol/L — ABNORMAL LOW (ref 135–145)
Total Bilirubin: 0.2 mg/dL — ABNORMAL LOW (ref 0.3–1.2)
Total Protein: 6.4 g/dL — ABNORMAL LOW (ref 6.5–8.1)

## 2019-12-04 LAB — GLUCOSE, CAPILLARY
Glucose-Capillary: 221 mg/dL — ABNORMAL HIGH (ref 70–99)
Glucose-Capillary: 221 mg/dL — ABNORMAL HIGH (ref 70–99)
Glucose-Capillary: 223 mg/dL — ABNORMAL HIGH (ref 70–99)
Glucose-Capillary: 291 mg/dL — ABNORMAL HIGH (ref 70–99)
Glucose-Capillary: 316 mg/dL — ABNORMAL HIGH (ref 70–99)
Glucose-Capillary: 348 mg/dL — ABNORMAL HIGH (ref 70–99)
Glucose-Capillary: 364 mg/dL — ABNORMAL HIGH (ref 70–99)
Glucose-Capillary: 368 mg/dL — ABNORMAL HIGH (ref 70–99)
Glucose-Capillary: 419 mg/dL — ABNORMAL HIGH (ref 70–99)
Glucose-Capillary: 420 mg/dL — ABNORMAL HIGH (ref 70–99)

## 2019-12-04 LAB — PROCALCITONIN: Procalcitonin: <0.1 ng/mL

## 2019-12-04 LAB — C-REACTIVE PROTEIN: CRP: 15.4 mg/dL — ABNORMAL HIGH (ref <1.0)

## 2019-12-04 LAB — D-DIMER, QUANTITATIVE: D-Dimer, Quant: 2.09 ug/mL-FEU — ABNORMAL HIGH (ref 0.00–0.50)

## 2019-12-04 LAB — OSMOLALITY: Osmolality: 298 mOsm/kg — ABNORMAL HIGH (ref 275–295)

## 2019-12-04 MED ORDER — DEXAMETHASONE SODIUM PHOSPHATE 10 MG/ML IJ SOLN
6.0000 mg | Freq: Two times a day (BID) | INTRAMUSCULAR | Status: DC
  Administered 2019-12-04 – 2019-12-07 (×8): 6 mg via INTRAVENOUS
  Filled 2019-12-04 (×8): qty 1

## 2019-12-04 MED ORDER — POTASSIUM CHLORIDE 10 MEQ/100ML IV SOLN
10.0000 meq | INTRAVENOUS | Status: AC
  Administered 2019-12-04: 10 meq via INTRAVENOUS
  Filled 2019-12-04: qty 100

## 2019-12-04 MED ORDER — INSULIN ASPART 100 UNIT/ML ~~LOC~~ SOLN
4.0000 [IU] | Freq: Three times a day (TID) | SUBCUTANEOUS | Status: DC
  Administered 2019-12-04 (×3): 4 [IU] via SUBCUTANEOUS

## 2019-12-04 MED ORDER — DEXTROSE 50 % IV SOLN: 0.0000 mL | INTRAVENOUS | Status: DC | PRN

## 2019-12-04 MED ORDER — ENOXAPARIN SODIUM 40 MG/0.4ML ~~LOC~~ SOLN
40.0000 mg | SUBCUTANEOUS | Status: DC
  Administered 2019-12-04 – 2019-12-07 (×4): 40 mg via SUBCUTANEOUS
  Filled 2019-12-04 (×4): qty 0.4

## 2019-12-04 MED ORDER — SODIUM CHLORIDE 0.9 % IV BOLUS
1000.0000 mL | INTRAVENOUS | Status: AC
  Administered 2019-12-04: 1000 mL via INTRAVENOUS

## 2019-12-04 MED ORDER — SODIUM CHLORIDE 0.9 % IV SOLN: INTRAVENOUS | Status: DC

## 2019-12-04 MED ORDER — DEXTROSE-NACL 5-0.45 % IV SOLN: INTRAVENOUS | Status: DC

## 2019-12-04 MED ORDER — INSULIN ASPART 100 UNIT/ML ~~LOC~~ SOLN
0.0000 [IU] | Freq: Three times a day (TID) | SUBCUTANEOUS | Status: DC
  Administered 2019-12-04: 11 [IU] via SUBCUTANEOUS
  Administered 2019-12-04: 15 [IU] via SUBCUTANEOUS
  Administered 2019-12-04: 5 [IU] via SUBCUTANEOUS

## 2019-12-04 MED ORDER — INSULIN ASPART 100 UNIT/ML ~~LOC~~ SOLN: 0.0000 [IU] | Freq: Every day | SUBCUTANEOUS | Status: DC

## 2019-12-04 MED ORDER — INSULIN DETEMIR 100 UNIT/ML ~~LOC~~ SOLN
25.0000 [IU] | Freq: Two times a day (BID) | SUBCUTANEOUS | Status: DC
  Administered 2019-12-04: 25 [IU] via SUBCUTANEOUS
  Filled 2019-12-04 (×2): qty 0.25

## 2019-12-04 MED ORDER — INSULIN DETEMIR 100 UNIT/ML ~~LOC~~ SOLN
25.0000 [IU] | Freq: Every day | SUBCUTANEOUS | Status: DC
  Administered 2019-12-04: 25 [IU] via SUBCUTANEOUS
  Filled 2019-12-04: qty 0.25

## 2019-12-04 MED ORDER — INSULIN REGULAR(HUMAN) IN NACL 100-0.9 UT/100ML-% IV SOLN
INTRAVENOUS | Status: DC
  Administered 2019-12-04: 15 [IU]/h via INTRAVENOUS
  Administered 2019-12-05: 11.5 [IU]/h via INTRAVENOUS
  Filled 2019-12-04: qty 100

## 2019-12-04 MED ORDER — INSULIN DETEMIR 100 UNIT/ML ~~LOC~~ SOLN: 25.0000 [IU] | Freq: Every day | SUBCUTANEOUS | Status: DC

## 2019-12-04 NOTE — Progress Notes (Signed)
[facility] PROGRESS NOTE    Progress Note  Judy [name]  [id number] DOB: 03/07/1941 DOA: 12/03/2019 PCP: Patient, No Pcp Per     Brief Narrative:   Judy [name] is an 79 y.o. female past medical history significant for diabetes mellitus type 2, essential hypertension chronic pain presents to the ED with 3 days of shortness of breath the patient reports that she was diagnosed with COVID-19 days ago during this time she been having menorrhagia anorexia with worsening over the next 6 days, and about 4 days prior to admission she started developing shortness of breath fever chills in the ED she was noted to be hypoxic in the 80s placed on 5 L and improved to 91%.  She was also found to be hyponatremic hyperkalemic, SARS-CoV-2 PCR was positive chest x-ray showed right-sided infiltrates  Assessment/Plan:   Acute respiratory failure with hypoxia secondarily to pneumonia due to COVID-19: Currently Requiring 6 to 8 L to keep saturations greater than 95%. Continue IV steroids and remdesivir, she did receive Actemra on 12/03/2019.  Her procalcitonin is low yield. Inflammatory markers are significantly elevated, they are pending this morning. Keep the patient prone for early 16 hours a day, but not prone out of bed to chair.  Chronic kidney disease stage IIIa/hyperkalemia With a baseline creatinine around 1.2-1.5 Multifactorial in the setting of significant decrease oral intake, NSAID use, Aldactone and hemodynamically mediated. She was started on gentle IV fluid hydration her potassium is improved to 4.3. Hold Aldactone and NSAIDs, KVO IV fluids.  Diabetes mellitus type 2 uncontrolled: A1c was 8.9, she was started on steroids which will make her blood glucose erratic. We will start her on 25 units of Lantus twice daily, and placed on  sliding scale.  Essential HTN (hypertension) Borderline blood pressure on admission continue to hold antihypertensive medication.  Chronic  pain/anxiety/depression. Continue current home meds narcotics and low-dose benzodiazepine nightly.   Class 1 obesity with serious comorbidity and body mass index (BMI) of 33.0 to 33.9 in adult     Estimated body mass index is 34.92 kg/m as calculated from the following:   Height as of this encounter: 5' (1.524 m).   Weight as of this encounter: 81.1 kg.    DVT prophylaxis: lovenox Family Communication:none Disposition Plan/Barrier to D/C: Unable to determine Code Status:     Code Status Orders  (From admission, onward)         Start     Ordered   12/03/19 1659  Do not attempt resuscitation (DNR)  Continuous    Question Answer Comment  In the event of cardiac or respiratory ARREST Do not call a "code blue"   In the event of cardiac or respiratory ARREST Do not perform Intubation, CPR, defibrillation or ACLS   In the event of cardiac or respiratory ARREST Use medication by any route, position, wound care, and other measures to relive pain and suffering. May use oxygen, suction and manual treatment of airway obstruction as needed for comfort.      12/03/19 1658        Code Status History    Date Active Date Inactive Code Status Order ID Comments User Context   11/19/2016 1804 11/21/2016 1702 Full Code [id number]  Jovita Gamma, MD Inpatient   03/21/2016 0903 03/24/2016 1731 Full Code [id number]  Sid Falcon, MD Inpatient   Advance Care Planning Activity    Advance Directive Documentation     Most Recent Value  Type of Advance Directive  Out of facility DNR (pink MOST or yellow form)  Pre-existing out of facility DNR order (yellow form or pink MOST form)  --  "MOST" Form in Place?  --        IV Access:    Peripheral IV   Procedures and diagnostic studies:   DG Chest Port 1 View  Result Date: 12/03/2019 CLINICAL DATA:  Transported by [facility] from urgent care-- tested positive for COVID 9 days ago, [facility] at [facility] for exertional shob, weakness, fatigue. Room air  sats read 88% and with 4 L of oxygen--94%. AAO x 4. From home. HTN EXAM: PORTABLE CHEST 1 VIEW COMPARISON:  Chest radiograph 11/04/2017 FINDINGS: Limited exam due to AP portable technique. Stable cardiomediastinal contours. There are opacities in the right upper and likely right lower lobe suspicious for infection. No definite infiltrate in the left lung. No pneumothorax or large pleural effusion. No acute finding in the visualized skeleton. IMPRESSION: Consolidative opacity within the right upper and likely right lower lung suspicious for infection. Electronically Signed   By: Audie Pinto M.D.   On: 12/03/2019 15:48     Medical Consultants:    None.  Anti-Infectives:   IV remdesivir  Subjective:    [name] relates her breathing is unchanged compared to yesterday.  Objective:    Vitals:   12/03/19 2200 12/03/19 2300 12/04/19 0000 12/04/19 0415  BP: (!) 118/58  (!) 113/58 117/62  Pulse: 79  71 69  Resp: (!) 28 (!) 22 18 (!) 21  Temp:  99 F (37.2 C) 98.7 F (37.1 C) 98.9 F (37.2 C)  TempSrc:  Oral Oral Oral  SpO2: 90%  90% 95%  Weight:  81.1 kg    Height:  5' (1.524 m)     SpO2: 95 % O2 Flow Rate (L/min): 8 L/min   Intake/Output Summary (Last 24 hours) at 12/04/2019 0715 Last data filed at 12/04/2019 0400 Gross per 24 hour  Intake 490 ml  Output 1400 ml  Net -910 ml   Filed Weights   12/03/19 2014 12/03/19 2300  Weight: 76.2 kg 81.1 kg    Exam: General exam: In no acute distress. Respiratory system: Good air movement and diffuse crackles. Cardiovascular system: S1 & S2 heard, RRR. No JVD. Gastrointestinal system: Abdomen is nondistended, soft and nontender.  Central nervous system: Alert and oriented. No focal neurological deficits. Extremities: No pedal edema. Skin: No rashes, lesions or ulcers Psychiatry: Judgement and insight appear normal. Mood & affect appropriate.   Data Reviewed:    Labs: Basic Metabolic Panel: Recent Labs  Lab  12/03/19 1450 12/03/19 1609 12/03/19 1812  NA 129*  --   --   K 5.7* 4.3  --   CL 93*  --   --   CO2 24  --   --   GLUCOSE 138*  --   --   BUN 29*  --   --   CREATININE 1.45*  --  1.43*  CALCIUM 8.8*  --   --    GFR Estimated Creatinine Clearance: 31.1 mL/min (A) (by C-G formula based on SCr of 1.43 mg/dL (H)). Liver Function Tests: Recent Labs  Lab 12/03/19 1450  AST 41  ALT 25  ALKPHOS 52  BILITOT 0.6  PROT 6.9  ALBUMIN 2.9*   No results for input(s): LIPASE, AMYLASE in the last 168 hours. No results for input(s): AMMONIA in the last 168 hours. Coagulation profile No results for input(s): INR, PROTIME in the last 168 hours. COVID-19  Labs  Recent Labs    12/03/19 1450  DDIMER 3.24*  FERRITIN 652*  LDH 382*  CRP 16.8*    Lab Results  Component Value Date   SARSCOV2NAA Not Detected 10/20/2019    CBC: Recent Labs  Lab 12/03/19 1450 12/03/19 1812  WBC 10.2 10.4  NEUTROABS 8.8*  --   HGB 13.4 12.5  HCT 40.5 37.2  MCV 90.2 89.2  PLT 272 274   Cardiac Enzymes: No results for input(s): CKTOTAL, CKMB, CKMBINDEX, TROPONINI in the last 168 hours. BNP (last 3 results) No results for input(s): PROBNP in the last 8760 hours. CBG: Recent Labs  Lab 12/03/19 1802 12/03/19 1923 12/04/19 0024  GLUCAP 130* 121* 291*   D-Dimer: Recent Labs    12/03/19 1450  DDIMER 3.24*   Hgb A1c: Recent Labs    12/03/19 1812  HGBA1C 8.1*   Lipid Profile: Recent Labs    12/03/19 1450  TRIG 115   Thyroid function studies: No results for input(s): TSH, T4TOTAL, T3FREE, THYROIDAB in the last 72 hours.  Invalid input(s): FREET3 Anemia work up: Recent Labs    12/03/19 1450  FERRITIN 652*   Sepsis Labs: Recent Labs  Lab 12/03/19 1450 12/03/19 1608 12/03/19 1812  PROCALCITON 0.20  --   --   WBC 10.2  --  10.4  LATICACIDVEN  --  1.8  --    Microbiology No results found for this or any previous visit (from the past 240 hour(s)).   Medications:   .  albuterol  2 puff Inhalation Q6H  . diltiazem  240 mg Oral Daily  . heparin  5,000 Units Subcutaneous Q8H  . insulin aspart  0-15 Units Subcutaneous TID WC  . methylPREDNISolone (SOLU-MEDROL) injection  60 mg Intravenous Q12H   Continuous Infusions: . remdesivir 100 mg in NS 100 mL       LOS: 1 day   Charlynne Cousins  [facility]  12/04/2019, 7:15 AM

## 2019-12-04 NOTE — Progress Notes (Signed)
Pt on 8L HFNC.  Was instructed using teachback method on proper use of Incentive spirometer and flutter valve.

## 2019-12-04 NOTE — Progress Notes (Signed)
Spoke with the patient's son Judy [name], and answered all questions at this time.

## 2019-12-05 LAB — BASIC METABOLIC PANEL
Anion gap: 9 (ref 5–15)
Anion gap: 11 (ref 5–15)
Anion gap: 12 (ref 5–15)
BUN: 31 mg/dL — ABNORMAL HIGH (ref 8–23)
BUN: 32 mg/dL — ABNORMAL HIGH (ref 8–23)
BUN: 34 mg/dL — ABNORMAL HIGH (ref 8–23)
CO2: 20 mmol/L — ABNORMAL LOW (ref 22–32)
CO2: 20 mmol/L — ABNORMAL LOW (ref 22–32)
CO2: 18 mmol/L — ABNORMAL LOW (ref 22–32)
Calcium: 8.5 mg/dL — ABNORMAL LOW (ref 8.9–10.3)
Calcium: 8.7 mg/dL — ABNORMAL LOW (ref 8.9–10.3)
Calcium: 8.7 mg/dL — ABNORMAL LOW (ref 8.9–10.3)
Chloride: 102 mmol/L (ref 98–111)
Chloride: 102 mmol/L (ref 98–111)
Chloride: 102 mmol/L (ref 98–111)
Creatinine, Ser: 1.15 mg/dL — ABNORMAL HIGH (ref 0.44–1.00)
Creatinine, Ser: 1.06 mg/dL — ABNORMAL HIGH (ref 0.44–1.00)
Creatinine, Ser: 1.13 mg/dL — ABNORMAL HIGH (ref 0.44–1.00)
GFR calc Af Amer: 53 mL/min — ABNORMAL LOW (ref >60)
GFR calc Af Amer: 59 mL/min — ABNORMAL LOW (ref >60)
GFR calc Af Amer: 54 mL/min — ABNORMAL LOW (ref >60)
GFR calc non Af Amer: 46 mL/min — ABNORMAL LOW (ref >60)
GFR calc non Af Amer: 51 mL/min — ABNORMAL LOW (ref >60)
GFR calc non Af Amer: 47 mL/min — ABNORMAL LOW (ref >60)
Glucose, Bld: 225 mg/dL — ABNORMAL HIGH (ref 70–99)
Glucose, Bld: 236 mg/dL — ABNORMAL HIGH (ref 70–99)
Glucose, Bld: 325 mg/dL — ABNORMAL HIGH (ref 70–99)
Potassium: 4.4 mmol/L (ref 3.5–5.1)
Potassium: 4.7 mmol/L (ref 3.5–5.1)
Potassium: 4.6 mmol/L (ref 3.5–5.1)
Sodium: 131 mmol/L — ABNORMAL LOW (ref 135–145)
Sodium: 133 mmol/L — ABNORMAL LOW (ref 135–145)
Sodium: 132 mmol/L — ABNORMAL LOW (ref 135–145)

## 2019-12-05 LAB — CBC WITH DIFFERENTIAL/PLATELET
Abs Immature Granulocytes: 0.6 10*3/uL — ABNORMAL HIGH (ref 0.00–0.07)
Basophils Absolute: 0 10*3/uL (ref 0.0–0.1)
Basophils Relative: 0 %
Eosinophils Absolute: 0 10*3/uL (ref 0.0–0.5)
Eosinophils Relative: 0 %
HCT: 36.2 % (ref 36.0–46.0)
Hemoglobin: 12.1 g/dL (ref 12.0–15.0)
Immature Granulocytes: 4 %
Lymphocytes Relative: 9 %
Lymphs Abs: 1.4 10*3/uL (ref 0.7–4.0)
MCH: 30 pg (ref 26.0–34.0)
MCHC: 33.4 g/dL (ref 30.0–36.0)
MCV: 89.6 fL (ref 80.0–100.0)
Monocytes Absolute: 0.7 10*3/uL (ref 0.1–1.0)
Monocytes Relative: 5 %
Neutro Abs: 12.6 10*3/uL — ABNORMAL HIGH (ref 1.7–7.7)
Neutrophils Relative %: 82 %
Platelets: 334 10*3/uL (ref 150–400)
RBC: 4.04 MIL/uL (ref 3.87–5.11)
RDW: 12.2 % (ref 11.5–15.5)
WBC: 15.4 10*3/uL — ABNORMAL HIGH (ref 4.0–10.5)
nRBC: 0 % (ref 0.0–0.2)

## 2019-12-05 LAB — COMPREHENSIVE METABOLIC PANEL
ALT: 24 U/L (ref 0–44)
AST: 25 U/L (ref 15–41)
Albumin: 2.4 g/dL — ABNORMAL LOW (ref 3.5–5.0)
Alkaline Phosphatase: 64 U/L (ref 38–126)
Anion gap: 10 (ref 5–15)
BUN: 29 mg/dL — ABNORMAL HIGH (ref 8–23)
CO2: 20 mmol/L — ABNORMAL LOW (ref 22–32)
Calcium: 8.3 mg/dL — ABNORMAL LOW (ref 8.9–10.3)
Chloride: 98 mmol/L (ref 98–111)
Creatinine, Ser: 1.06 mg/dL — ABNORMAL HIGH (ref 0.44–1.00)
GFR calc Af Amer: 59 mL/min — ABNORMAL LOW (ref >60)
GFR calc non Af Amer: 51 mL/min — ABNORMAL LOW (ref >60)
Glucose, Bld: 149 mg/dL — ABNORMAL HIGH (ref 70–99)
Potassium: 4.4 mmol/L (ref 3.5–5.1)
Sodium: 128 mmol/L — ABNORMAL LOW (ref 135–145)
Total Bilirubin: 0.3 mg/dL (ref 0.3–1.2)
Total Protein: 5.4 g/dL — ABNORMAL LOW (ref 6.5–8.1)

## 2019-12-05 LAB — C-REACTIVE PROTEIN: CRP: 8.1 mg/dL — ABNORMAL HIGH (ref <1.0)

## 2019-12-05 LAB — PROCALCITONIN: Procalcitonin: <0.1 ng/mL

## 2019-12-05 LAB — GLUCOSE, CAPILLARY
Glucose-Capillary: 134 mg/dL — ABNORMAL HIGH (ref 70–99)
Glucose-Capillary: 136 mg/dL — ABNORMAL HIGH (ref 70–99)
Glucose-Capillary: 157 mg/dL — ABNORMAL HIGH (ref 70–99)
Glucose-Capillary: 176 mg/dL — ABNORMAL HIGH (ref 70–99)
Glucose-Capillary: 200 mg/dL — ABNORMAL HIGH (ref 70–99)
Glucose-Capillary: 200 mg/dL — ABNORMAL HIGH (ref 70–99)
Glucose-Capillary: 202 mg/dL — ABNORMAL HIGH (ref 70–99)
Glucose-Capillary: 213 mg/dL — ABNORMAL HIGH (ref 70–99)
Glucose-Capillary: 215 mg/dL — ABNORMAL HIGH (ref 70–99)
Glucose-Capillary: 305 mg/dL — ABNORMAL HIGH (ref 70–99)
Glucose-Capillary: 327 mg/dL — ABNORMAL HIGH (ref 70–99)
Glucose-Capillary: 366 mg/dL — ABNORMAL HIGH (ref 70–99)

## 2019-12-05 LAB — D-DIMER, QUANTITATIVE: D-Dimer, Quant: 1.43 ug/mL-FEU — ABNORMAL HIGH (ref 0.00–0.50)

## 2019-12-05 MED ORDER — INSULIN ASPART 100 UNIT/ML ~~LOC~~ SOLN
10.0000 [IU] | Freq: Once | SUBCUTANEOUS | Status: AC
  Administered 2019-12-05: 10 [IU] via SUBCUTANEOUS

## 2019-12-05 MED ORDER — INSULIN ASPART 100 UNIT/ML ~~LOC~~ SOLN: 6.0000 [IU] | Freq: Three times a day (TID) | SUBCUTANEOUS | Status: DC

## 2019-12-05 MED ORDER — INSULIN ASPART 100 UNIT/ML ~~LOC~~ SOLN
0.0000 [IU] | Freq: Three times a day (TID) | SUBCUTANEOUS | Status: DC
  Administered 2019-12-05: 15 [IU] via SUBCUTANEOUS
  Administered 2019-12-06: 11 [IU] via SUBCUTANEOUS
  Administered 2019-12-06: 3 [IU] via SUBCUTANEOUS
  Administered 2019-12-06: 7 [IU] via SUBCUTANEOUS
  Administered 2019-12-07: 4 [IU] via SUBCUTANEOUS
  Administered 2019-12-07: 3 [IU] via SUBCUTANEOUS
  Administered 2019-12-08 (×2): 4 [IU] via SUBCUTANEOUS
  Administered 2019-12-09 (×2): 3 [IU] via SUBCUTANEOUS
  Administered 2019-12-10: 7 [IU] via SUBCUTANEOUS

## 2019-12-05 MED ORDER — INSULIN DETEMIR 100 UNIT/ML ~~LOC~~ SOLN
50.0000 [IU] | Freq: Two times a day (BID) | SUBCUTANEOUS | Status: DC
  Administered 2019-12-05: 50 [IU] via SUBCUTANEOUS
  Filled 2019-12-05 (×3): qty 0.5

## 2019-12-05 MED ORDER — INSULIN ASPART 100 UNIT/ML ~~LOC~~ SOLN
15.0000 [IU] | Freq: Three times a day (TID) | SUBCUTANEOUS | Status: DC
  Administered 2019-12-05 – 2019-12-06 (×4): 15 [IU] via SUBCUTANEOUS

## 2019-12-05 MED ORDER — INSULIN ASPART 100 UNIT/ML ~~LOC~~ SOLN
0.0000 [IU] | Freq: Three times a day (TID) | SUBCUTANEOUS | Status: DC
  Administered 2019-12-05 (×2): 4 [IU] via SUBCUTANEOUS

## 2019-12-05 MED ORDER — INSULIN ASPART 100 UNIT/ML ~~LOC~~ SOLN
10.0000 [IU] | Freq: Three times a day (TID) | SUBCUTANEOUS | Status: DC
  Administered 2019-12-05 (×2): 10 [IU] via SUBCUTANEOUS

## 2019-12-05 MED ORDER — SODIUM CHLORIDE 0.9 % IV SOLN: INTRAVENOUS | Status: AC

## 2019-12-05 MED ORDER — INSULIN ASPART 100 UNIT/ML ~~LOC~~ SOLN
0.0000 [IU] | Freq: Every day | SUBCUTANEOUS | Status: DC
  Administered 2019-12-05 – 2019-12-06 (×2): 5 [IU] via SUBCUTANEOUS
  Administered 2019-12-07: 2 [IU] via SUBCUTANEOUS

## 2019-12-05 MED ORDER — INSULIN ASPART 100 UNIT/ML ~~LOC~~ SOLN: 0.0000 [IU] | Freq: Every day | SUBCUTANEOUS | Status: DC

## 2019-12-05 NOTE — Progress Notes (Signed)
MD updated on patient last 3 consecutive BG within range.  Orders given to DC insulin drip. Patient is saline locked, report given to oncoming RN.

## 2019-12-05 NOTE — Progress Notes (Addendum)
[facility] PROGRESS NOTE    Progress Note  Judy [name]  [id number] DOB: 1941-04-01 DOA: 12/03/2019 PCP: Patient, No Pcp Per     Brief Narrative:   Judy [name] is an 79 y.o. female past medical history significant for diabetes mellitus type 2, essential hypertension chronic pain presents to the ED with 3 days of shortness of breath the patient reports that she was diagnosed with COVID-19 days ago during this time she been having menorrhagia anorexia with worsening over the next 6 days, and about 4 days prior to admission she started developing shortness of breath fever chills in the ED she was noted to be hypoxic in the 80s placed on 5 L and improved to 91%.  She was also found to be hyponatremic hyperkalemic, SARS-CoV-2 PCR was positive chest x-ray showed right-sided infiltrates  Assessment/Plan:   Acute respiratory failure with hypoxia secondarily to pneumonia due to COVID-19: Judy [name] continues to require 6 to 8 L of oxygen to keep saturations greater than 88%. Continue IV remdesivir and steroids she did receive Actemra on 12/03/2019 her procalcitonin is low yield, so empiric antibiotics are not needed at this point in time. Inflammatory markers are improving, try to keep the patient peripherally 16 hours a day if not prone out of bed to chair. Continue incentive spirometry and flutter valve, she relates her breathing is not improved compared to yesterday.  Chronic kidney disease stage IIIa/hyperkalemia With a baseline creatinine around 1.2-1.5 Multifactorial in the setting of significant decrease oral intake, NSAID use, Aldactone and hemodynamically mediated. She was started on gentle IV fluid hydration her potassium is improved to 4.3. Hold Aldactone and NSAIDs, KVO IV fluids.  New hyponatremia: She was started on IV fluids on admission for concerns of hypovolemia, and her sodium improved to 134 fluids was stopped but now her sodium has regressed to 128. We will  start her on aggressive IV fluid again recheck a basic metabolic panel this afternoon.  Diabetes mellitus type 2 uncontrolled with hyperglycemia/chronic kidney disease stage IIIa: With an A1c of 8.9, she was started on steroids and her blood glucose became erratic yesterday night her blood glucose was greater than 400 she was started on an insulin drip and her blood glucose is improved this morning. We will start Lantus and overlap with IV insulin drip for 2 hours and start her on resistant sliding scale.  Essential HTN (hypertension) Blood pressure seems to be well controlled, continue diltiazem hold Aldactone.  Chronic pain/anxiety/depression. Continue current home meds narcotics and low-dose benzodiazepine nightly.   Class 1 obesity with serious comorbidity and body mass index (BMI) of 33.0 to 33.9 in adult     Estimated body mass index is 34.92 kg/m as calculated from the following:   Height as of this encounter: 5' (1.524 m).   Weight as of this encounter: 81.1 kg.    DVT prophylaxis: lovenox Family Communication:none Disposition Plan/Barrier to D/C: Unable to determine Code Status:     Code Status Orders  (From admission, onward)           Start     Ordered   12/03/19 1659  Do not attempt resuscitation (DNR)  Continuous    Question Answer Comment  In the event of cardiac or respiratory ARREST Do not call a "code blue"   In the event of cardiac or respiratory ARREST Do not perform Intubation, CPR, defibrillation or ACLS   In the event of cardiac or respiratory ARREST Use medication by any route,  position, wound care, and other measures to relive pain and suffering. May use oxygen, suction and manual treatment of airway obstruction as needed for comfort.      12/03/19 1658           Code Status History     Date Active Date Inactive Code Status Order ID Comments User Context   11/19/2016 1804 11/21/2016 1702 Full Code [id number]  Jovita Gamma, MD Inpatient    03/21/2016 0903 03/24/2016 1731 Full Code [id number]  Sid Falcon, MD Inpatient   Advance Care Planning Activity      Advance Directive Documentation      Most Recent Value  Type of Advance Directive  Out of facility DNR (pink MOST or yellow form)  Pre-existing out of facility DNR order (yellow form or pink MOST form)    "MOST" Form in Place?           IV Access:   Peripheral IV   Procedures and diagnostic studies:   DG Chest Port 1 View  Result Date: 12/03/2019 CLINICAL DATA:  Transported by [facility] from urgent care-- tested positive for COVID 9 days ago, seen at [facility] for exertional shob, weakness, fatigue. Room air sats read 88% and with 4 L of oxygen--94%. AAO x 4. From home. HTN EXAM: PORTABLE CHEST 1 VIEW COMPARISON:  Chest radiograph 11/04/2017 FINDINGS: Limited exam due to AP portable technique. Stable cardiomediastinal contours. There are opacities in the right upper and likely right lower lobe suspicious for infection. No definite infiltrate in the left lung. No pneumothorax or large pleural effusion. No acute finding in the visualized skeleton. IMPRESSION: Consolidative opacity within the right upper and likely right lower lung suspicious for infection. Electronically Signed   By: Audie Pinto M.D.   On: 12/03/2019 15:48     Medical Consultants:   None.  Anti-Infectives:   IV remdesivir  Subjective:    Judy [name] relates her breathing is unchanged compared to yesterday.  Objective:    Vitals:   12/04/19 1619 12/04/19 2022 12/05/19 0000 12/05/19 0400  BP: (!) 119/59 (!) 122/58 (!) 114/59 108/65  Pulse: 74 72 67 60  Resp: 20 (!) 22 (!) 21 (!) 22  Temp: 97.6 F (36.4 C) 98.7 F (37.1 C) 98.1 F (36.7 C) 98 F (36.7 C)  TempSrc: Oral Oral Oral Oral  SpO2: 91% (!) 88% 90% 90%  Weight:      Height:       SpO2: 90 % O2 Flow Rate (L/min): 8 L/min   Intake/Output Summary (Last 24 hours) at 12/05/2019 0716 Last data filed at 12/04/2019 2000  Gross per 24 hour  Intake 504.99 ml  Output 500 ml  Net 4.99 ml   Filed Weights   12/03/19 2014 12/03/19 2300  Weight: 76.2 kg 81.1 kg    Exam: General exam: In no acute distress. Respiratory system: Good air movement and clear to auscultation. Cardiovascular system: S1 & S2 heard, RRR. No JVD. Gastrointestinal system: Abdomen is nondistended, soft and nontender.  Central nervous system: Alert and oriented. No focal neurological deficits. Extremities: No pedal edema. Skin: No rashes, lesions or ulcers Psychiatry: Judgement and insight appear normal. Mood & affect appropriate.    Data Reviewed:    Labs: Basic Metabolic Panel: Recent Labs  Lab 12/04/19 0814 12/04/19 1750 12/04/19 2031 12/05/19 0024 12/05/19 0423  NA 129* 131* 134* 131* 128*  K 4.3 4.9 4.5 4.4 4.4  CL 95* 96* 102 102 98  CO2 22 23 20* 20*  20*  GLUCOSE 232* 435* 278* 225* 149*  BUN 28* 33* 31* 31* 29*  CREATININE 1.20* 1.47* 1.25* 1.15* 1.06*  CALCIUM 8.2* 8.9 8.6* 8.5* 8.3*   GFR Estimated Creatinine Clearance: 41.9 mL/min (A) (by C-G formula based on SCr of 1.06 mg/dL (H)). Liver Function Tests: Recent Labs  Lab 12/03/19 1450 12/04/19 0814 12/05/19 0423  AST 41 30 25  ALT 25 26 24   ALKPHOS 52 56 64  BILITOT 0.6 0.2* 0.3  PROT 6.9 6.4* 5.4*  ALBUMIN 2.9* 2.5* 2.4*   No results for input(s): LIPASE, AMYLASE in the last 168 hours. No results for input(s): AMMONIA in the last 168 hours. Coagulation profile No results for input(s): INR, PROTIME in the last 168 hours. COVID-19 Labs  Recent Labs    12/03/19 1450 12/04/19 0814 12/05/19 0423  DDIMER 3.24* 2.09* 1.43*  FERRITIN 652*  --   --   LDH 382*  --   --   CRP 16.8* 15.4* 8.1*    Lab Results  Component Value Date   SARSCOV2NAA Not Detected 10/20/2019    CBC: Recent Labs  Lab 12/03/19 1450 12/03/19 1812 12/04/19 0814 12/05/19 0423  WBC 10.2 10.4 9.0 15.4*  NEUTROABS 8.8*  --  7.8* 12.6*  HGB 13.4 12.5 12.8 12.1  HCT  40.5 37.2 37.8 36.2  MCV 90.2 89.2 88.3 89.6  PLT 272 274 291 334   Cardiac Enzymes: No results for input(s): CKTOTAL, CKMB, CKMBINDEX, TROPONINI in the last 168 hours. BNP (last 3 results) No results for input(s): PROBNP in the last 8760 hours. CBG: Recent Labs  Lab 12/05/19 0126 12/05/19 0213 12/05/19 0323 12/05/19 0439 12/05/19 0535  GLUCAP 202* 200* 157* 134* 136*   D-Dimer: Recent Labs    12/04/19 0814 12/05/19 0423  DDIMER 2.09* 1.43*   Hgb A1c: Recent Labs    12/03/19 1812  HGBA1C 8.1*   Lipid Profile: Recent Labs    12/03/19 1450  TRIG 115   Thyroid function studies: No results for input(s): TSH, T4TOTAL, T3FREE, THYROIDAB in the last 72 hours.  Invalid input(s): FREET3 Anemia work up: Recent Labs    12/03/19 1450  FERRITIN 652*   Sepsis Labs: Recent Labs  Lab 12/03/19 1450 12/03/19 1608 12/03/19 1812 12/04/19 0814 12/05/19 0423  PROCALCITON 0.20  --   --  <0.10 <0.10  WBC 10.2  --  10.4 9.0 15.4*  LATICACIDVEN  --  1.8  --   --   --    Microbiology Recent Results (from the past 240 hour(s))  Blood Culture (routine x 2)     Status: None (Preliminary result)   Collection Time: 12/03/19  2:50 PM   Specimen: BLOOD  Result Value Ref Range Status   Specimen Description   Final    BLOOD RIGHT ANTECUBITAL Performed at [facility], [place] [address] [place], [place] [address]    Special Requests   Final    BOTTLES DRAWN AEROBIC AND ANAEROBIC Blood Culture adequate volume Performed at [facility] [address] [place], [place] [address]    Culture   Final    NO GROWTH < 24 HOURS Performed at [facility] [address], [place],  [address]    Report Status PENDING  Incomplete  Blood Culture (routine x 2)     Status: None (Preliminary result)   Collection Time: 12/03/19  4:20 PM   Specimen: BLOOD  Result Value Ref Range Status   Specimen Description   Final  BLOOD LEFT ANTECUBITAL  Performed at [facility] [address] [place], [place] [address]    Special Requests   Final    BOTTLES DRAWN AEROBIC AND ANAEROBIC Blood Culture results may not be optimal due to an inadequate volume of blood received in culture bottles Performed at [facility] [address] [place], [place] [address]    Culture   Final    NO GROWTH < 24 HOURS Performed at [facility] [address], [place], [place] [address]    Report Status PENDING  Incomplete     Medications:    albuterol  2 puff Inhalation Q6H   dexamethasone (DECADRON) injection  6 mg Intravenous Q12H   diltiazem  240 mg Oral Daily   enoxaparin (LOVENOX) injection  40 mg Subcutaneous Q24H   insulin detemir  25 Units Subcutaneous BID   Continuous Infusions:  sodium chloride 75 mL/hr at 12/04/19 1935   remdesivir 100 mg in NS 100 mL Stopped (12/04/19 1900)     LOS: 2 days   Charlynne Cousins  [facility]  12/05/2019, 7:16 AM

## 2019-12-05 NOTE — Progress Notes (Signed)
Uneventful shift, pt OOB to recliner, BG as ordered s/s and standing insulin, BG elevated standing insulin dose increased per Provider, no s/s of hypo/hyperglycemia this shift. Denies pain or discomfort, no SOB or resp distress noted, remains on O2 via [place], Sats remain in the mid 90s, day3/5 Remdesivir, will cont to monitor.

## 2019-12-05 NOTE — Evaluation (Signed)
Physical Therapy Evaluation Patient Details Name: Judy [name] MRN: [id number] DOB: May 24, 1941 Today's Date: 12/05/2019   History of Present Illness  79 year old female admitted with COVID, and right sided infiltrates in lung, per chest x ray, hyponatremia, and hyperkalemia; PMH includes DM2, HTN, and chronic pain. She is noted to have difficulty processing to complete a task or to answer a question.  Clinical Impression  This patient presents with generalized weakness throughout CORE and LEs with fair postural awareness. She presents with reduced safety awareness as well as little insight into current medical situation. She relates that she lives with a roommate and is still working as a Quarry manager. She indicated that she was independent in all ADLs and IADLs. States that she lives in a one level home, walk in shower, elevated toilet, no AD for ambulation, and 3 outside steps with handrails. Should benefit from PT to further address strengthening, energy conservation, and safety in gait for home and community environments to reduce risk of falls.     Follow Up Recommendations Home health PT    Equipment Recommendations  Rolling walker with 5" wheels(Will determine if she still has rollator as she did not mention during EVAL)    Recommendations for Other Services       Precautions / Restrictions Precautions Precautions: Fall Restrictions Weight Bearing Restrictions: No      Mobility  Bed Mobility Overal bed mobility: Needs Assistance                Transfers Overall transfer level: Needs assistance Equipment used: 1 person hand held assist Transfers: Sit to/from Stand Sit to Stand: Min assist            Ambulation/Gait Ambulation/Gait assistance: Min assist Gait Distance (Feet): 24 Feet(Ambulated 4 steps forward and back, then asked to use BS commode and ambulated to and from 8 steps each.)   Gait Pattern/deviations: Wide base of support;Shuffle        Stairs             Wheelchair Mobility    Modified Rankin (Stroke Patients Only)       Balance Overall balance assessment: Mild deficits observed, not formally tested                                           Pertinent Vitals/Pain Pain Assessment: No/denies pain    Home Living Family/patient expects to be discharged to:: Private residence Living Arrangements: Other (Comment)(Lives with a "roomate") Available Help at Discharge: Friend(s);Available 24 hours/day Type of Home: House Home Access: Level entry     Home Layout: One level Home Equipment: None;Hand held shower head;Tub bench Additional Comments: 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 Level of Independence: Independent         Comments: States that she uses no AD for ambulation     Hand Dominance   Dominant Hand: Right    Extremity/Trunk Assessment   Upper Extremity Assessment Upper Extremity Assessment: Defer to OT evaluation    Lower Extremity Assessment Lower Extremity Assessment: Defer to PT evaluation;Generalized weakness    Cervical / Trunk Assessment Cervical / Trunk Assessment: Kyphotic;Lordotic(Has had back surgery)  Communication   Communication: Other (comment)(As previously note, was having difficulty with processing to complete a task or answer a question)  Cognition Arousal/Alertness: Awake/alert   Overall Cognitive Status: Impaired/Different from  baseline Area of Impairment: Memory;Safety/judgement                     Memory: Decreased short-term memory   Safety/Judgement: Decreased awareness of safety;Decreased awareness of deficits     General Comments: Patient asked if she was going to be going home today, and presents with reduced insight in current medical situation      General Comments General comments (skin integrity, edema, etc.): Mild swelling in LEs, skin presents as prone to skin tears and bruising    Exercises General  Exercises - Lower Extremity Ankle Circles/Pumps: AROM;Seated Quad Sets: AROM;Seated Short Arc Quad: AROM;Seated Long Arc Quad: AROM;Seated Hip ABduction/ADduction: AROM;Seated Hip Flexion/Marching: AROM;Seated Other Exercises Other Exercises: Practiced IS and able to achieve 500 4 out of 5 times. Also returned demo on "flutter" exerciser   Assessment/Plan    PT Assessment Patient needs continued PT services  PT Problem List Decreased strength;Decreased activity tolerance;Decreased safety awareness;Cardiopulmonary status limiting activity       PT Treatment Interventions Gait training;Therapeutic activities;Patient/family education;Therapeutic exercise    PT Goals (Current goals can be found in the Care Plan section)  Acute Rehab PT Goals Patient Stated Goal: Of interest is that she asked "am I going home today". She does indicate she wants to return to home and be as independent as possible. States she is still working as a Quarry manager, but could not confirm this. PT Goal Formulation: With patient Time For Goal Achievement: 12/19/19 Potential to Achieve Goals: Good    Frequency Min 3X/week   Barriers to discharge        Co-evaluation               AM-PAC PT "6 Clicks" Mobility  Outcome Measure Help needed turning from your back to your side while in a flat bed without using bedrails?: A Little Help needed moving from lying on your back to sitting on the side of a flat bed without using bedrails?: A Little Help needed moving to and from a bed to a chair (including a wheelchair)?: A Little Help needed standing up from a chair using your arms (e.g., wheelchair or bedside chair)?: A Little Help needed to walk in hospital room?: A Lot Help needed climbing 3-5 steps with a railing? : A Lot 6 Click Score: 16    End of Session   Activity Tolerance: Patient limited by fatigue;Other (comment)(Shortness of breath- O2 sats dropped to 86 in standing, taking steps to and from [facility]  commode. Good recovery 4 min to 95% SPO2) Patient left: in chair   PT Visit Diagnosis: Muscle weakness (generalized) (M62.81);Unsteadiness on feet (R26.81)    Time: 0115-0210 PT Time Calculation (min) (ACUTE ONLY): 55 min   Charges:   PT Evaluation $PT Eval Moderate Complexity: 1 Mod PT Treatments $Gait Training: 8-22 mins $Therapeutic Exercise: 8-22 mins $Therapeutic Activity: 8-22 mins        Rollen Sox, PT # [phone] CGV cell   Casandra Doffing 12/05/2019, 4:30 PM

## 2019-12-06 ENCOUNTER — Inpatient Hospital Stay (HOSPITAL_COMMUNITY): Payer: Medicare Other

## 2019-12-06 LAB — COMPREHENSIVE METABOLIC PANEL
ALT: 25 U/L (ref 0–44)
AST: 23 U/L (ref 15–41)
Albumin: 2.7 g/dL — ABNORMAL LOW (ref 3.5–5.0)
Alkaline Phosphatase: 84 U/L (ref 38–126)
Anion gap: 10 (ref 5–15)
BUN: 34 mg/dL — ABNORMAL HIGH (ref 8–23)
CO2: 21 mmol/L — ABNORMAL LOW (ref 22–32)
Calcium: 9.3 mg/dL (ref 8.9–10.3)
Chloride: 103 mmol/L (ref 98–111)
Creatinine, Ser: 1.17 mg/dL — ABNORMAL HIGH (ref 0.44–1.00)
GFR calc Af Amer: 52 mL/min — ABNORMAL LOW (ref >60)
GFR calc non Af Amer: 45 mL/min — ABNORMAL LOW (ref >60)
Glucose, Bld: 255 mg/dL — ABNORMAL HIGH (ref 70–99)
Potassium: 5.2 mmol/L — ABNORMAL HIGH (ref 3.5–5.1)
Sodium: 134 mmol/L — ABNORMAL LOW (ref 135–145)
Total Bilirubin: 0.2 mg/dL — ABNORMAL LOW (ref 0.3–1.2)
Total Protein: 6 g/dL — ABNORMAL LOW (ref 6.5–8.1)

## 2019-12-06 LAB — CBC WITH DIFFERENTIAL/PLATELET
Abs Immature Granulocytes: 1.22 10*3/uL — ABNORMAL HIGH (ref 0.00–0.07)
Basophils Absolute: 0.1 10*3/uL (ref 0.0–0.1)
Basophils Relative: 1 %
Eosinophils Absolute: 0 10*3/uL (ref 0.0–0.5)
Eosinophils Relative: 0 %
HCT: 38.1 % (ref 36.0–46.0)
Hemoglobin: 12.3 g/dL (ref 12.0–15.0)
Immature Granulocytes: 6 %
Lymphocytes Relative: 8 %
Lymphs Abs: 1.5 10*3/uL (ref 0.7–4.0)
MCH: 29.4 pg (ref 26.0–34.0)
MCHC: 32.3 g/dL (ref 30.0–36.0)
MCV: 91.1 fL (ref 80.0–100.0)
Monocytes Absolute: 0.9 10*3/uL (ref 0.1–1.0)
Monocytes Relative: 5 %
Neutro Abs: 15.3 10*3/uL — ABNORMAL HIGH (ref 1.7–7.7)
Neutrophils Relative %: 80 %
Platelets: 402 10*3/uL — ABNORMAL HIGH (ref 150–400)
RBC: 4.18 MIL/uL (ref 3.87–5.11)
RDW: 12.5 % (ref 11.5–15.5)
WBC: 19.1 10*3/uL — ABNORMAL HIGH (ref 4.0–10.5)
nRBC: 0 % (ref 0.0–0.2)

## 2019-12-06 LAB — GLUCOSE, CAPILLARY
Glucose-Capillary: 145 mg/dL — ABNORMAL HIGH (ref 70–99)
Glucose-Capillary: 228 mg/dL — ABNORMAL HIGH (ref 70–99)
Glucose-Capillary: 279 mg/dL — ABNORMAL HIGH (ref 70–99)
Glucose-Capillary: 381 mg/dL — ABNORMAL HIGH (ref 70–99)

## 2019-12-06 LAB — D-DIMER, QUANTITATIVE: D-Dimer, Quant: 1.16 ug/mL-FEU — ABNORMAL HIGH (ref 0.00–0.50)

## 2019-12-06 LAB — C-REACTIVE PROTEIN: CRP: 4.1 mg/dL — ABNORMAL HIGH (ref <1.0)

## 2019-12-06 MED ORDER — SODIUM POLYSTYRENE SULFONATE 15 GM/60ML PO SUSP
15.0000 g | Freq: Once | ORAL | Status: AC
  Administered 2019-12-06: 15 g via ORAL
  Filled 2019-12-06: qty 60

## 2019-12-06 MED ORDER — INSULIN DETEMIR 100 UNIT/ML ~~LOC~~ SOLN
65.0000 [IU] | Freq: Two times a day (BID) | SUBCUTANEOUS | Status: DC
  Administered 2019-12-06 – 2019-12-07 (×3): 65 [IU] via SUBCUTANEOUS
  Filled 2019-12-06 (×4): qty 0.65

## 2019-12-06 MED ORDER — SODIUM CHLORIDE 0.9 % IV SOLN: INTRAVENOUS | Status: AC

## 2019-12-06 MED ORDER — INSULIN DETEMIR 100 UNIT/ML ~~LOC~~ SOLN
10.0000 [IU] | Freq: Once | SUBCUTANEOUS | Status: AC
  Administered 2019-12-06: 10 [IU] via SUBCUTANEOUS
  Filled 2019-12-06: qty 0.1

## 2019-12-06 MED ORDER — INSULIN DETEMIR 100 UNIT/ML ~~LOC~~ SOLN
50.0000 [IU] | Freq: Two times a day (BID) | SUBCUTANEOUS | Status: DC
  Administered 2019-12-06: 50 [IU] via SUBCUTANEOUS
  Filled 2019-12-06: qty 0.5

## 2019-12-06 NOTE — Progress Notes (Signed)
[facility] PROGRESS NOTE    Progress Note  Judy [name]  [id number] DOB: 10-26-1941 DOA: 12/03/2019 PCP: Patient, No Pcp Per     Brief Narrative:   Judy [name] is an 79 y.o. female past medical history significant for diabetes mellitus type 2, essential hypertension chronic pain presents to the ED with 3 days of shortness of breath the patient reports that she was diagnosed with COVID-19 days ago during this time she been having menorrhagia anorexia with worsening over the next 6 days, and about 4 days prior to admission she started developing shortness of breath fever chills in the ED she was noted to be hypoxic in the 80s placed on 5 L and improved to 91%.  She was also found to be hyponatremic hyperkalemic, SARS-CoV-2 PCR was positive chest x-ray showed right-sided infiltrates  Assessment/Plan:   Acute respiratory failure with hypoxia secondarily to pneumonia due to COVID-19: Judy [name] is requiring 8 L of oxygen keep saturations greater than 92%. Continue IV remdesivir and steroids, she received Actemra on 12/03/2019. Inflammatory laboratory markers continue to improve. Try to keep the patient peripherally 16 hours a day if not prone out of bed to chair, continues into spirometry and flutter valve. She relates her breathing is unchanged, she also related this morning feels tired and fatigued.  Continue to trend inflammatory markers.  Her oxygen requirements have not worsened.  Check a chest x-ray febrile and leukocytosis is worsening question due to steroids.  Chronic kidney disease stage IIIa/hyperkalemia With a baseline creatinine 1.2-1.5. Multifactorial in the setting of decreased oral intake, NSAID use and Aldactone as well as hemodynamically mediated. KVO IV fluids.  New hyperkalemia: Likely residual due to the use of NSAIDs and Aldactone. We will give her oral Kayexalate.  Recheck basic metabolic panel in the morning.  New hypovolemic hyponatremia: In the  setting of decreased oral intake and diuretic use. She was started on IV fluid hydration her sodium is improved nicely will continue for an additional 24 hours.  At a slower rate.  Diabetes mellitus type 2 uncontrolled with hyperglycemia/chronic kidney disease stage IIIa: A1c of 8.9, she was initially treated with IV insulin drip with her blood glucose improved, we proceeded to overlap her with long-acting insulin plus sliding scale and her sugars increased to 300 she was placed on high-dose long-acting insulin plus sliding scale and her blood glucose improving this morning is 145. Continue current regimen.  Essential HTN (hypertension) Blood pressure seems to be well controlled, continue diltiazem hold Aldactone.  Chronic pain/anxiety/depression. Continue current home meds narcotics and low-dose benzodiazepine nightly.   Class 1 obesity with serious comorbidity and body mass index (BMI) of 33.0 to 33.9 in adult     Estimated body mass index is 34.88 kg/m as calculated from the following:   Height as of this encounter: 5' (1.524 m).   Weight as of this encounter: 81 kg.    DVT prophylaxis: lovenox Family Communication:none Disposition Plan/Barrier to D/C: Unable to determine Code Status:     Code Status Orders  (From admission, onward)           Start     Ordered   12/03/19 1659  Do not attempt resuscitation (DNR)  Continuous    Question Answer Comment  In the event of cardiac or respiratory ARREST Do not call a "code blue"   In the event of cardiac or respiratory ARREST Do not perform Intubation, CPR, defibrillation or ACLS   In the event of cardiac  or respiratory ARREST Use medication by any route, position, wound care, and other measures to relive pain and suffering. May use oxygen, suction and manual treatment of airway obstruction as needed for comfort.      12/03/19 1658           Code Status History     Date Active Date Inactive Code Status Order ID  Comments User Context   11/19/2016 1804 11/21/2016 1702 Full Code [id number]  Jovita Gamma, MD Inpatient   03/21/2016 0903 03/24/2016 1731 Full Code [id number]  Sid Falcon, MD Inpatient   Advance Care Planning Activity      Advance Directive Documentation      Most Recent Value  Type of Advance Directive  Out of facility DNR (pink MOST or yellow form)  Pre-existing out of facility DNR order (yellow form or pink MOST form)  --  "MOST" Form in Place?  --         IV Access:    Peripheral IV   Procedures and diagnostic studies:   No results found.   Medical Consultants:    None.  Anti-Infectives:   IV remdesivir  Subjective:    Judy [name] relates her breathing is unchanged compared to yesterday.  She also relates she today she feels more tired than yesterday.  Objective:    Vitals:   12/05/19 2000 12/06/19 0012 12/06/19 0426 12/06/19 0500  BP: (!) 115/49 122/70 (!) 127/54   Pulse: (!) 57 75 (!) 53   Resp: 18 20 15    Temp: 97.7 F (36.5 C) 98 F (36.7 C) 98.2 F (36.8 C)   TempSrc: Oral Oral Oral   SpO2: 92% 92% 93%   Weight:    81 kg  Height:       SpO2: 93 % O2 Flow Rate (L/min): 8 L/min   Intake/Output Summary (Last 24 hours) at 12/06/2019 0755 Last data filed at 12/05/2019 1713 Gross per 24 hour  Intake 120 ml  Output 550 ml  Net -430 ml   Filed Weights   12/03/19 2014 12/03/19 2300 12/06/19 0500  Weight: 76.2 kg 81.1 kg 81 kg    Exam: General exam: In no acute distress. Respiratory system: Good air movement and diffuse crackles bilaterally. Cardiovascular system: S1 & S2 heard, RRR. No JVD. Gastrointestinal system: Abdomen is nondistended, soft and nontender.  Central nervous system: Alert and oriented. No focal neurological deficits. Extremities: No pedal edema. Skin: No rashes, lesions or ulcers Psychiatry: Judgement and insight appear normal. Mood & affect appropriate.    Data Reviewed:    Labs: Basic Metabolic  Panel: Recent Labs  Lab 12/05/19 0024 12/05/19 0423 12/05/19 0930 12/05/19 1455 12/06/19 0130  NA 131* 128* 133* 132* 134*  K 4.4 4.4 4.7 4.6 5.2*  CL 102 98 102 102 103  CO2 20* 20* 20* 18* 21*  GLUCOSE 225* 149* 236* 325* 255*  BUN 31* 29* 32* 34* 34*  CREATININE 1.15* 1.06* 1.06* 1.13* 1.17*  CALCIUM 8.5* 8.3* 8.7* 8.7* 9.3   GFR Estimated Creatinine Clearance: 38 mL/min (A) (by C-G formula based on SCr of 1.17 mg/dL (H)). Liver Function Tests: Recent Labs  Lab 12/03/19 1450 12/04/19 0814 12/05/19 0423 12/06/19 0130  AST 41 30 25 23   ALT 25 26 24 25   ALKPHOS 52 56 64 84  BILITOT 0.6 0.2* 0.3 0.2*  PROT 6.9 6.4* 5.4* 6.0*  ALBUMIN 2.9* 2.5* 2.4* 2.7*   No results for input(s): LIPASE, AMYLASE in the last 168 hours. No  results for input(s): AMMONIA in the last 168 hours. Coagulation profile No results for input(s): INR, PROTIME in the last 168 hours. COVID-19 Labs  Recent Labs    12/03/19 1450 12/04/19 0814 12/05/19 0423 12/06/19 0130  DDIMER 3.24* 2.09* 1.43* 1.16*  FERRITIN 652*  --   --   --   LDH 382*  --   --   --   CRP 16.8* 15.4* 8.1* 4.1*    Lab Results  Component Value Date   SARSCOV2NAA Not Detected 10/20/2019    CBC: Recent Labs  Lab 12/03/19 1450 12/03/19 1812 12/04/19 0814 12/05/19 0423 12/06/19 0130  WBC 10.2 10.4 9.0 15.4* 19.1*  NEUTROABS 8.8*  --  7.8* 12.6* 15.3*  HGB 13.4 12.5 12.8 12.1 12.3  HCT 40.5 37.2 37.8 36.2 38.1  MCV 90.2 89.2 88.3 89.6 91.1  PLT 272 274 291 334 402*   Cardiac Enzymes: No results for input(s): CKTOTAL, CKMB, CKMBINDEX, TROPONINI in the last 168 hours. BNP (last 3 results) No results for input(s): PROBNP in the last 8760 hours. CBG: Recent Labs  Lab 12/05/19 0811 12/05/19 1153 12/05/19 1525 12/05/19 1626 12/05/19 2011  GLUCAP 176* 200* 327* 305* 366*   D-Dimer: Recent Labs    12/05/19 0423 12/06/19 0130  DDIMER 1.43* 1.16*   Hgb A1c: Recent Labs    12/03/19 1812  HGBA1C 8.1*    Lipid Profile: Recent Labs    12/03/19 1450  TRIG 115   Thyroid function studies: No results for input(s): TSH, T4TOTAL, T3FREE, THYROIDAB in the last 72 hours.  Invalid input(s): FREET3 Anemia work up: Recent Labs    12/03/19 1450  FERRITIN 652*   Sepsis Labs: Recent Labs  Lab 12/03/19 1450 12/03/19 1608 12/03/19 1812 12/04/19 0814 12/05/19 0423 12/06/19 0130  PROCALCITON 0.20  --   --  <0.10 <0.10  --   WBC 10.2  --  10.4 9.0 15.4* 19.1*  LATICACIDVEN  --  1.8  --   --   --   --    Microbiology Recent Results (from the past 240 hour(s))  Blood Culture (routine x 2)     Status: None (Preliminary result)   Collection Time: 12/03/19  2:50 PM   Specimen: BLOOD  Result Value Ref Range Status   Specimen Description   Final    BLOOD RIGHT ANTECUBITAL Performed at [facility], [place] [address], [place], [place] [address]    Special Requests   Final    BOTTLES DRAWN AEROBIC AND ANAEROBIC Blood Culture adequate volume Performed at [place] [address] [place], [place] [address]    Culture   Final    NO GROWTH 2 DAYS Performed at [place] [address] [place], [place] [address]    Report Status PENDING  Incomplete  Blood Culture (routine x 2)     Status: None (Preliminary result)   Collection Time: 12/03/19  4:20 PM   Specimen: BLOOD  Result Value Ref Range Status   Specimen Description   Final    BLOOD LEFT ANTECUBITAL Performed at [place] [address] [place], [place] [address]    Special Requests   Final    BOTTLES DRAWN AEROBIC AND ANAEROBIC Blood Culture results may not be optimal due to an inadequate volume of blood received in culture bottles Performed at [place] [address], [place], [place] [address]    Culture   Final    NO GROWTH 2  DAYS Performed at [facility], [place] [address], [place], [place] [address]    Report Status  PENDING  Incomplete     Medications:   . albuterol  2 puff Inhalation Q6H  . dexamethasone (DECADRON) injection  6 mg Intravenous Q12H  . diltiazem  240 mg Oral Daily  . enoxaparin (LOVENOX) injection  40 mg Subcutaneous Q24H  . insulin aspart  0-20 Units Subcutaneous TID WC  . insulin aspart  0-5 Units Subcutaneous QHS  . insulin aspart  15 Units Subcutaneous TID WC  . insulin detemir  50 Units Subcutaneous BID   Continuous Infusions: . remdesivir 100 mg in NS 100 mL 100 mg (12/05/19 1037)     LOS: 3 days   Charlynne Cousins  [facility]  12/06/2019, 7:55 AM

## 2019-12-07 LAB — CBC WITH DIFFERENTIAL/PLATELET
Abs Immature Granulocytes: 1.36 10*3/uL — ABNORMAL HIGH (ref 0.00–0.07)
Basophils Absolute: 0.2 10*3/uL — ABNORMAL HIGH (ref 0.0–0.1)
Basophils Relative: 1 %
Eosinophils Absolute: 0 10*3/uL (ref 0.0–0.5)
Eosinophils Relative: 0 %
HCT: 37.5 % (ref 36.0–46.0)
Hemoglobin: 12.5 g/dL (ref 12.0–15.0)
Immature Granulocytes: 7 %
Lymphocytes Relative: 10 %
Lymphs Abs: 1.9 10*3/uL (ref 0.7–4.0)
MCH: 29.6 pg (ref 26.0–34.0)
MCHC: 33.3 g/dL (ref 30.0–36.0)
MCV: 88.7 fL (ref 80.0–100.0)
Monocytes Absolute: 0.7 10*3/uL (ref 0.1–1.0)
Monocytes Relative: 4 %
Neutro Abs: 14.5 10*3/uL — ABNORMAL HIGH (ref 1.7–7.7)
Neutrophils Relative %: 78 %
Platelets: 444 10*3/uL — ABNORMAL HIGH (ref 150–400)
RBC: 4.23 MIL/uL (ref 3.87–5.11)
RDW: 12.6 % (ref 11.5–15.5)
WBC: 18.7 10*3/uL — ABNORMAL HIGH (ref 4.0–10.5)
nRBC: 0.2 % (ref 0.0–0.2)

## 2019-12-07 LAB — GLUCOSE, CAPILLARY
Glucose-Capillary: 103 mg/dL — ABNORMAL HIGH (ref 70–99)
Glucose-Capillary: 149 mg/dL — ABNORMAL HIGH (ref 70–99)
Glucose-Capillary: 185 mg/dL — ABNORMAL HIGH (ref 70–99)
Glucose-Capillary: 225 mg/dL — ABNORMAL HIGH (ref 70–99)

## 2019-12-07 LAB — COMPREHENSIVE METABOLIC PANEL
ALT: 28 U/L (ref 0–44)
AST: 23 U/L (ref 15–41)
Albumin: 2.8 g/dL — ABNORMAL LOW (ref 3.5–5.0)
Alkaline Phosphatase: 65 U/L (ref 38–126)
Anion gap: 12 (ref 5–15)
BUN: 32 mg/dL — ABNORMAL HIGH (ref 8–23)
CO2: 24 mmol/L (ref 22–32)
Calcium: 9.2 mg/dL (ref 8.9–10.3)
Chloride: 98 mmol/L (ref 98–111)
Creatinine, Ser: 1.01 mg/dL — ABNORMAL HIGH (ref 0.44–1.00)
GFR calc Af Amer: >60 mL/min (ref >60)
GFR calc non Af Amer: 54 mL/min — ABNORMAL LOW (ref >60)
Glucose, Bld: 112 mg/dL — ABNORMAL HIGH (ref 70–99)
Potassium: 4.4 mmol/L (ref 3.5–5.1)
Sodium: 134 mmol/L — ABNORMAL LOW (ref 135–145)
Total Bilirubin: 0.7 mg/dL (ref 0.3–1.2)
Total Protein: 5.9 g/dL — ABNORMAL LOW (ref 6.5–8.1)

## 2019-12-07 LAB — C-REACTIVE PROTEIN: CRP: 1.6 mg/dL — ABNORMAL HIGH (ref <1.0)

## 2019-12-07 LAB — D-DIMER, QUANTITATIVE: D-Dimer, Quant: 0.79 ug/mL-FEU — ABNORMAL HIGH (ref 0.00–0.50)

## 2019-12-07 MED ORDER — INSULIN ASPART 100 UNIT/ML ~~LOC~~ SOLN
15.0000 [IU] | Freq: Three times a day (TID) | SUBCUTANEOUS | Status: DC
  Administered 2019-12-07 (×3): 15 [IU] via SUBCUTANEOUS

## 2019-12-07 MED ORDER — INSULIN ASPART 100 UNIT/ML ~~LOC~~ SOLN: 25.0000 [IU] | Freq: Three times a day (TID) | SUBCUTANEOUS | Status: DC

## 2019-12-07 NOTE — Progress Notes (Signed)
[facility] PROGRESS NOTE    Progress Note  Judy [name]  [id number] DOB: Aug 13, 1941 DOA: 12/03/2019 PCP: Patient, No Pcp Per     Brief Narrative:   Judy [name] is an 79 y.o. female past medical history significant for diabetes mellitus type 2, essential hypertension chronic pain presents to the ED with 3 days of shortness of breath the patient reports that she was diagnosed with COVID-19 days ago during this time she been having menorrhagia anorexia with worsening over the next 6 days, and about 4 days prior to admission she started developing shortness of breath fever chills in the ED she was noted to be hypoxic in the 80s placed on 5 L and improved to 91%.  She was also found to be hyponatremic hyperkalemic, SARS-CoV-2 PCR was positive chest x-ray showed right-sided infiltrates  Assessment/Plan:   Acute respiratory failure with hypoxia secondarily to pneumonia due to COVID-19: Ms. Judy [name] oxygen saturation is improving she is now requiring 5 L of oxygen to keep saturations greater 96%. Continue IV remdesivir and steroids she is status post Actemra on 12/03/2019. Markers have improved drastically. Try to keep the patient prone for at least 16 hours a day, if not prone out of bed to chair continue to use incentive spirometry and flutter valve. She relates today that her breathing is slightly improved compared to yesterday her tiredness she relates seems a little bit better. Leukocytosis was elevated yesterday and today, she has remained afebrile, her chest x-ray shows no new infiltrates.  Chronic kidney disease stage IIIa/hyperkalemia With a baseline creatinine 1.2-1.5. Multifactorial in the setting of decreased oral intake, NSAID use and Aldactone as well as hemodynamically mediated. KVO IV fluids.  New hyperkalemia: Likely residual effect from NSAID and Aldactone. She was given oral Kayexalate her basic metabolic panel is pending this morning.  New hypovolemic  hyponatremia: In the setting of decreased oral intake and diuretic use, she was started on IV fluids her sodium is improving nicely.  Diabetes mellitus type 2 uncontrolled with hyperglycemia/chronic kidney disease stage IIIa: Her blood glucose is completely erratic likely due to steroids and possibly her diet when I went in she had putting an Ensure. She is currently on 65 units of long-acting insulin plus persistent scale sliding scale  Essential HTN (hypertension) Blood pressure seems to be well controlled, continue diltiazem hold Aldactone.  Chronic pain/anxiety/depression. Continue current home meds narcotics and low-dose benzodiazepine nightly.   Class 1 obesity with serious comorbidity and body mass index (BMI) of 33.0 to 33.9 in adult     Estimated body mass index is 35 kg/m as calculated from the following:   Height as of this encounter: 5' (1.524 m).   Weight as of this encounter: 81.3 kg.    DVT prophylaxis: lovenox Family Communication:none Disposition Plan/Barrier to D/C: Unable to determine Code Status:     Code Status Orders  (From admission, onward)           Start     Ordered   12/03/19 1659  Do not attempt resuscitation (DNR)  Continuous    Question Answer Comment  In the event of cardiac or respiratory ARREST Do not call a "code blue"   In the event of cardiac or respiratory ARREST Do not perform Intubation, CPR, defibrillation or ACLS   In the event of cardiac or respiratory ARREST Use medication by any route, position, wound care, and other measures to relive pain and suffering. May use oxygen, suction and manual treatment of airway  obstruction as needed for comfort.      12/03/19 1658           Code Status History     Date Active Date Inactive Code Status Order ID Comments User Context   11/19/2016 1804 11/21/2016 1702 Full Code [id number]  Jovita Gamma, MD Inpatient   03/21/2016 0903 03/24/2016 1731 Full Code [id number]  Sid Falcon,  MD Inpatient   Advance Care Planning Activity      Advance Directive Documentation      Most Recent Value  Type of Advance Directive  Out of facility DNR (pink MOST or yellow form)  Pre-existing out of facility DNR order (yellow form or pink MOST form)  --  "MOST" Form in Place?  --         IV Access:    Peripheral IV   Procedures and diagnostic studies:   DG CHEST PORT 1 VIEW  Result Date: 12/06/2019 CLINICAL DATA:  Dyspnea EXAM: PORTABLE CHEST 1 VIEW COMPARISON:  12/03/2019 FINDINGS: Heart size is enlarged, stable. Improving aeration of the bilateral lungs compared to prior with resolving bilateral airspace opacities. Prominent interstitial markings bilaterally versus, although improved. No pleural effusion or pneumothorax. IMPRESSION: Improving aeration of the lungs with resolving bilateral airspace opacities. Electronically Signed   By: Davina Poke D.O.   On: 12/06/2019 12:38     Medical Consultants:    None.  Anti-Infectives:   IV remdesivir  Subjective:    Judy [name] relates her breathing is better than yesterday she has a better appetite today.  Objective:    Vitals:   12/06/19 2000 12/07/19 0000 12/07/19 0400 12/07/19 0500  BP: (!) 138/59 (!) 143/86 (!) 153/80   Pulse: 75 71 60   Resp: 16 16 14    Temp: 98 F (36.7 C) 98.4 F (36.9 C) 97.9 F (36.6 C)   TempSrc: Oral Oral Oral   SpO2: 97% 97% 97%   Weight:    81.3 kg  Height:       SpO2: 97 % O2 Flow Rate (L/min): 5 L/min   Intake/Output Summary (Last 24 hours) at 12/07/2019 0741 Last data filed at 12/07/2019 0305 Gross per 24 hour  Intake 1330 ml  Output 800 ml  Net 530 ml   Filed Weights   12/03/19 2300 12/06/19 0500 12/07/19 0500  Weight: 81.1 kg 81 kg 81.3 kg    Exam: General exam: In no acute distress. Respiratory system: Good air movement and diffuse crackles bilaterally. Cardiovascular system: S1 & S2 heard, RRR. No JVD. Gastrointestinal system: Abdomen is  nondistended, soft and nontender.  Central nervous system: Alert and oriented. No focal neurological deficits. Extremities: No pedal edema. Skin: No rashes, lesions or ulcers Psychiatry: Judgement and insight appear normal. Mood & affect appropriate.    Data Reviewed:    Labs: Basic Metabolic Panel: Recent Labs  Lab 12/05/19 0024 12/05/19 0423 12/05/19 0930 12/05/19 1455 12/06/19 0130  NA 131* 128* 133* 132* 134*  K 4.4 4.4 4.7 4.6 5.2*  CL 102 98 102 102 103  CO2 20* 20* 20* 18* 21*  GLUCOSE 225* 149* 236* 325* 255*  BUN 31* 29* 32* 34* 34*  CREATININE 1.15* 1.06* 1.06* 1.13* 1.17*  CALCIUM 8.5* 8.3* 8.7* 8.7* 9.3   GFR Estimated Creatinine Clearance: 38 mL/min (A) (by C-G formula based on SCr of 1.17 mg/dL (H)). Liver Function Tests: Recent Labs  Lab 12/03/19 1450 12/04/19 0814 12/05/19 0423 12/06/19 0130  AST 41 30 25 23   ALT 25  26 24 25   ALKPHOS 52 56 64 84  BILITOT 0.6 0.2* 0.3 0.2*  PROT 6.9 6.4* 5.4* 6.0*  ALBUMIN 2.9* 2.5* 2.4* 2.7*   No results for input(s): LIPASE, AMYLASE in the last 168 hours. No results for input(s): AMMONIA in the last 168 hours. Coagulation profile No results for input(s): INR, PROTIME in the last 168 hours. COVID-19 Labs  Recent Labs    12/04/19 0814 12/05/19 0423 12/06/19 0130  DDIMER 2.09* 1.43* 1.16*  CRP 15.4* 8.1* 4.1*    Lab Results  Component Value Date   SARSCOV2NAA Not Detected 10/20/2019    CBC: Recent Labs  Lab 12/03/19 1450 12/03/19 1812 12/04/19 0814 12/05/19 0423 12/06/19 0130  WBC 10.2 10.4 9.0 15.4* 19.1*  NEUTROABS 8.8*  --  7.8* 12.6* 15.3*  HGB 13.4 12.5 12.8 12.1 12.3  HCT 40.5 37.2 37.8 36.2 38.1  MCV 90.2 89.2 88.3 89.6 91.1  PLT 272 274 291 334 402*   Cardiac Enzymes: No results for input(s): CKTOTAL, CKMB, CKMBINDEX, TROPONINI in the last 168 hours. BNP (last 3 results) No results for input(s): PROBNP in the last 8760 hours. CBG: Recent Labs  Lab 12/05/19 2011 12/06/19 0746  12/06/19 1117 12/06/19 1645 12/06/19 2107  GLUCAP 366* 145* 228* 279* 381*   D-Dimer: Recent Labs    12/05/19 0423 12/06/19 0130  DDIMER 1.43* 1.16*   Hgb A1c: No results for input(s): HGBA1C in the last 72 hours. Lipid Profile: No results for input(s): CHOL, HDL, LDLCALC, TRIG, CHOLHDL, LDLDIRECT in the last 72 hours. Thyroid function studies: No results for input(s): TSH, T4TOTAL, T3FREE, THYROIDAB in the last 72 hours.  Invalid input(s): FREET3 Anemia work up: No results for input(s): VITAMINB12, FOLATE, FERRITIN, TIBC, IRON, RETICCTPCT in the last 72 hours. Sepsis Labs: Recent Labs  Lab 12/03/19 1450 12/03/19 1608 12/03/19 1812 12/04/19 0814 12/05/19 0423 12/06/19 0130  PROCALCITON 0.20  --   --  <0.10 <0.10  --   WBC 10.2  --  10.4 9.0 15.4* 19.1*  LATICACIDVEN  --  1.8  --   --   --   --    Microbiology Recent Results (from the past 240 hour(s))  Blood Culture (routine x 2)     Status: None (Preliminary result)   Collection Time: 12/03/19  2:50 PM   Specimen: BLOOD  Result Value Ref Range Status   Specimen Description   Final    BLOOD RIGHT ANTECUBITAL Performed at [facility], [place] [address], [place], [place] [address]    Special Requests   Final    BOTTLES DRAWN AEROBIC AND ANAEROBIC Blood Culture adequate volume Performed at [facility] [address], [place], [place] [address]    Culture   Final    NO GROWTH 3 DAYS Performed at [facility], [place] [address], [place], [place] [address]    Report Status PENDING  Incomplete  Blood Culture (routine x 2)     Status: None (Preliminary result)   Collection Time: 12/03/19  4:20 PM   Specimen: BLOOD  Result Value Ref Range Status   Specimen Description   Final    BLOOD LEFT ANTECUBITAL Performed at [facility] [address] [place],  [address]    Special Requests   Final    BOTTLES DRAWN AEROBIC AND ANAEROBIC Blood Culture  results may not be optimal due to an inadequate volume of blood received in culture bottles Performed at [facility]  [address], [place], [place] [address]    Culture   Final    NO GROWTH 3 DAYS Performed at [facility], [place] [address] [place], [place] [address]    Report Status PENDING  Incomplete     Medications:   . albuterol  2 puff Inhalation Q6H  . dexamethasone (DECADRON) injection  6 mg Intravenous Q12H  . diltiazem  240 mg Oral Daily  . enoxaparin (LOVENOX) injection  40 mg Subcutaneous Q24H  . insulin aspart  0-20 Units Subcutaneous TID WC  . insulin aspart  0-5 Units Subcutaneous QHS  . insulin aspart  15 Units Subcutaneous TID WC  . insulin detemir  65 Units Subcutaneous BID   Continuous Infusions: . remdesivir 100 mg in NS 100 mL 100 mg (12/06/19 1024)     LOS: 4 days   Charlynne Cousins  [facility]  12/07/2019, 7:41 AM

## 2019-12-07 NOTE — Plan of Care (Signed)
  Problem: Education: Goal: Knowledge of risk factors and measures for prevention of condition will improve Outcome: Progressing   Problem: Coping: Goal: Psychosocial and spiritual needs will be supported Outcome: Progressing   Problem: Respiratory: Goal: Will maintain a patent airway Outcome: Progressing Goal: Complications related to the disease process, condition or treatment will be avoided or minimized Outcome: Progressing   

## 2019-12-07 NOTE — Progress Notes (Deleted)
[facility] PROGRESS NOTE    Progress Note  DEDRIA LAFEVER  [id number] DOB: [birth year] DOA: 12/03/2019 PCP: Patient, No Pcp Per     Brief Narrative:   NIMRAT KNOBLAUCH is an 79 y.o. female past medical history significant for diabetes mellitus type 2, essential hypertension chronic pain presents to the ED with 3 days of shortness of breath the patient reports that she was diagnosed with COVID-19 days ago during this time she been having menorrhagia anorexia with worsening over the next 6 days, and about 4 days prior to admission she started developing shortness of breath fever chills in the ED she was noted to be hypoxic in the 80s placed on 5 L and improved to 91%.  She was also found to be hyponatremic hyperkalemic, SARS-CoV-2 PCR was positive chest x-ray showed right-sided infiltrates  Assessment/Plan:   Acute respiratory failure with hypoxia secondarily to pneumonia due to COVID-19: [name] oxygen saturation is improving she is now requiring 5 L of oxygen to keep saturations greater 96%. Continue IV remdesivir and steroids she is status post Actemra on 12/03/2019. Markers have improved drastically. Try to keep the patient prone for at least 16 hours a day, if not prone out of bed to chair continue to use incentive spirometry and flutter valve. She relates today that her breathing is slightly improved compared to yesterday her tiredness she relates seems a little bit better. Leukocytosis was elevated yesterday, she has remained afebrile, her chest x-ray shows no new infiltrates, will await CBC this morning and the basic metabolic panel.  Chronic kidney disease stage IIIa/hyperkalemia With a baseline creatinine 1.2-1.5. Multifactorial in the setting of decreased oral intake, NSAID use and Aldactone as well as hemodynamically mediated. KVO IV fluids.  New hyperkalemia: Likely residual effect from NSAID and Aldactone. She was given oral Kayexalate her basic metabolic panel  is pending this morning.  New hypovolemic hyponatremia: In the setting of decreased oral intake and diuretic use, she was started on IV fluids her sodium has improved it is now 134, KVO IV fluids.  Diabetes mellitus type 2 uncontrolled with hyperglycemia/chronic kidney disease stage IIIa: Her blood glucose is completely erratic likely due to steroids and possibly her diet when I went in she had putting an Ensure. She is currently on 65 units of long-acting insulin plus persistent scale sliding scale  Essential HTN (hypertension) Blood pressure seems to be well controlled, continue diltiazem hold Aldactone.  Chronic pain/anxiety/depression. Continue current home meds narcotics and low-dose benzodiazepine nightly.   Class 1 obesity with serious comorbidity and body mass index (BMI) of 33.0 to 33.9 in adult     Estimated body mass index is 35 kg/m as calculated from the following:   Height as of this encounter: 5' (1.524 m).   Weight as of this encounter: 81.3 kg.    DVT prophylaxis: lovenox Family Communication:none Disposition Plan/Barrier to D/C: Unable to determine Code Status:     Code Status Orders  (From admission, onward)           Start     Ordered   12/03/19 1659  Do not attempt resuscitation (DNR)  Continuous    Question Answer Comment  In the event of cardiac or respiratory ARREST Do not call a "code blue"   In the event of cardiac or respiratory ARREST Do not perform Intubation, CPR, defibrillation or ACLS   In the event of cardiac or respiratory ARREST Use medication by any route, position, wound care, and other measures  to relive pain and suffering. May use oxygen, suction and manual treatment of airway obstruction as needed for comfort.      12/03/19 1658           Code Status History     Date Active Date Inactive Code Status Order ID Comments User Context   11/19/2016 1804 11/21/2016 1702 Full Code [id number]  Jovita Gamma, MD Inpatient    03/21/2016 0903 03/24/2016 1731 Full Code [id number]  Sid Falcon, MD Inpatient   Advance Care Planning Activity      Advance Directive Documentation      Most Recent Value  Type of Advance Directive  Out of facility DNR (pink MOST or yellow form)  Pre-existing out of facility DNR order (yellow form or pink MOST form)  --  "MOST" Form in Place?  --         IV Access:    Peripheral IV   Procedures and diagnostic studies:   DG CHEST PORT 1 VIEW  Result Date: 12/06/2019 CLINICAL DATA:  Dyspnea EXAM: PORTABLE CHEST 1 VIEW COMPARISON:  12/03/2019 FINDINGS: Heart size is enlarged, stable. Improving aeration of the bilateral lungs compared to prior with resolving bilateral airspace opacities. Prominent interstitial markings bilaterally versus, although improved. No pleural effusion or pneumothorax. IMPRESSION: Improving aeration of the lungs with resolving bilateral airspace opacities. Electronically Signed   By: Davina Poke D.O.   On: 12/06/2019 12:38     Medical Consultants:    None.  Anti-Infectives:   IV remdesivir  Subjective:    [name] relates her breathing is improved compared to yesterday.  Objective:    Vitals:   12/07/19 0000 12/07/19 0400 12/07/19 0500 12/07/19 0800  BP: (!) 143/86 (!) 153/80  (!) 123/106  Pulse: 71 60  77  Resp: 16 14  17   Temp: 98.4 F (36.9 C) 97.9 F (36.6 C)    TempSrc: Oral Oral    SpO2: 97% 97%  96%  Weight:   81.3 kg   Height:       SpO2: 96 % O2 Flow Rate (L/min): 5 L/min   Intake/Output Summary (Last 24 hours) at 12/07/2019 1032 Last data filed at 12/07/2019 0305 Gross per 24 hour  Intake 1210 ml  Output 800 ml  Net 410 ml   Filed Weights   12/03/19 2300 12/06/19 0500 12/07/19 0500  Weight: 81.1 kg 81 kg 81.3 kg    Exam: General exam: In no acute distress. Respiratory system: Good air movement and diffuse crackles bilaterally. Cardiovascular system: S1 & S2 heard, RRR. No JVD. Gastrointestinal  system: Abdomen is nondistended, soft and nontender.  Central nervous system: Alert and oriented. No focal neurological deficits. Extremities: No pedal edema. Skin: No rashes, lesions or ulcers Psychiatry: Judgement and insight appear normal. Mood & affect appropriate.    Data Reviewed:    Labs: Basic Metabolic Panel: Recent Labs  Lab 12/05/19 0423 12/05/19 0930 12/05/19 1455 12/06/19 0130 12/07/19 0623  NA 128* 133* 132* 134* 134*  K 4.4 4.7 4.6 5.2* 4.4  CL 98 102 102 103 98  CO2 20* 20* 18* 21* 24  GLUCOSE 149* 236* 325* 255* 112*  BUN 29* 32* 34* 34* 32*  CREATININE 1.06* 1.06* 1.13* 1.17* 1.01*  CALCIUM 8.3* 8.7* 8.7* 9.3 9.2   GFR Estimated Creatinine Clearance: 44 mL/min (A) (by C-G formula based on SCr of 1.01 mg/dL (H)). Liver Function Tests: Recent Labs  Lab 12/03/19 1450 12/04/19 [id number] 12/05/19 0423 12/06/19 0130 12/07/19 [id number]  AST 41 30 25 23 23   ALT 25 26 24 25 28   ALKPHOS 52 56 64 84 65  BILITOT 0.6 0.2* 0.3 0.2* 0.7  PROT 6.9 6.4* 5.4* 6.0* 5.9*  ALBUMIN 2.9* 2.5* 2.4* 2.7* 2.8*   No results for input(s): LIPASE, AMYLASE in the last 168 hours. No results for input(s): AMMONIA in the last 168 hours. Coagulation profile No results for input(s): INR, PROTIME in the last 168 hours. COVID-19 Labs  Recent Labs    12/05/19 0423 12/06/19 0130 12/07/19 0623  DDIMER 1.43* 1.16* 0.79*  CRP 8.1* 4.1* 1.6*    Lab Results  Component Value Date   SARSCOV2NAA Not Detected 10/20/2019    CBC: Recent Labs  Lab 12/03/19 1450 12/03/19 1812 12/04/19 0814 12/05/19 0423 12/06/19 0130 12/07/19 0623  WBC 10.2 10.4 9.0 15.4* 19.1* 18.7*  NEUTROABS 8.8*  --  7.8* 12.6* 15.3* 14.5*  HGB 13.4 12.5 12.8 12.1 12.3 12.5  HCT 40.5 37.2 37.8 36.2 38.1 37.5  MCV 90.2 89.2 88.3 89.6 91.1 88.7  PLT 272 274 291 334 402* 444*   Cardiac Enzymes: No results for input(s): CKTOTAL, CKMB, CKMBINDEX, TROPONINI in the last 168 hours. BNP (last 3 results) No results  for input(s): PROBNP in the last 8760 hours. CBG: Recent Labs  Lab 12/06/19 0746 12/06/19 1117 12/06/19 1645 12/06/19 2107 12/07/19 0833  GLUCAP 145* 228* 279* 381* 103*   D-Dimer: Recent Labs    12/06/19 0130 12/07/19 0623  DDIMER 1.16* 0.79*   Hgb A1c: No results for input(s): HGBA1C in the last 72 hours. Lipid Profile: No results for input(s): CHOL, HDL, LDLCALC, TRIG, CHOLHDL, LDLDIRECT in the last 72 hours. Thyroid function studies: No results for input(s): TSH, T4TOTAL, T3FREE, THYROIDAB in the last 72 hours.  Invalid input(s): FREET3 Anemia work up: No results for input(s): VITAMINB12, FOLATE, FERRITIN, TIBC, IRON, RETICCTPCT in the last 72 hours. Sepsis Labs: Recent Labs  Lab 12/03/19 1450 12/03/19 1608 12/04/19 0814 12/05/19 0423 12/06/19 0130 12/07/19 0623  PROCALCITON 0.20  --  <0.10 <0.10  --   --   WBC 10.2  --  9.0 15.4* 19.1* 18.7*  LATICACIDVEN  --  1.8  --   --   --   --    Microbiology Recent Results (from the past 240 hour(s))  Blood Culture (routine x 2)     Status: None (Preliminary result)   Collection Time: 12/03/19  2:50 PM   Specimen: BLOOD  Result Value Ref Range Status   Specimen Description   Final    BLOOD RIGHT ANTECUBITAL Performed at [facility], [place] [address], [place], [place] [address]    Special Requests   Final    BOTTLES DRAWN AEROBIC AND ANAEROBIC Blood Culture adequate volume Performed at [facility] [address] [place], [place] [address]    Culture   Final    NO GROWTH 3 DAYS Performed at [facility], [place] [address], [place], [place] [address]    Report Status PENDING  Incomplete  Blood Culture (routine x 2)     Status: None (Preliminary result)   Collection Time: 12/03/19  4:20 PM   Specimen: BLOOD  Result Value Ref Range Status   Specimen Description   Final    BLOOD LEFT ANTECUBITAL Performed at [facility] [address]  [place],  [address]    Special Requests   Final    BOTTLES DRAWN AEROBIC AND ANAEROBIC Blood Culture results may  not be optimal due to an inadequate volume of blood received in culture bottles Performed at [facility] [address], [place], [place] [address]    Culture   Final    NO GROWTH 3 DAYS Performed at [facility], [place] [address] [place],  [address]    Report Status PENDING  Incomplete     Medications:   . albuterol  2 puff Inhalation Q6H  . dexamethasone (DECADRON) injection  6 mg Intravenous Q12H  . diltiazem  240 mg Oral Daily  . enoxaparin (LOVENOX) injection  40 mg Subcutaneous Q24H  . insulin aspart  0-20 Units Subcutaneous TID WC  . insulin aspart  0-5 Units Subcutaneous QHS  . insulin aspart  15 Units Subcutaneous TID WC  . insulin detemir  65 Units Subcutaneous BID   Continuous Infusions: . remdesivir 100 mg in NS 100 mL 100 mg (12/06/19 1024)     LOS: 4 days   Charlynne Cousins  [facility]  12/07/2019, 10:32 AM

## 2019-12-08 LAB — GLUCOSE, CAPILLARY
Glucose-Capillary: 65 mg/dL — ABNORMAL LOW (ref 70–99)
Glucose-Capillary: 193 mg/dL — ABNORMAL HIGH (ref 70–99)
Glucose-Capillary: 169 mg/dL — ABNORMAL HIGH (ref 70–99)
Glucose-Capillary: 73 mg/dL (ref 70–99)

## 2019-12-08 LAB — CULTURE, BLOOD (ROUTINE X 2)
Culture: NO GROWTH
Culture: NO GROWTH
Special Requests: ADEQUATE

## 2019-12-08 LAB — CBC WITH DIFFERENTIAL/PLATELET
Abs Immature Granulocytes: 1.7 10*3/uL — ABNORMAL HIGH (ref 0.00–0.07)
Basophils Absolute: 0 10*3/uL (ref 0.0–0.1)
Basophils Relative: 0 %
Eosinophils Absolute: 0 10*3/uL (ref 0.0–0.5)
Eosinophils Relative: 0 %
HCT: 39.4 % (ref 36.0–46.0)
Hemoglobin: 13.1 g/dL (ref 12.0–15.0)
Immature Granulocytes: 8 %
Lymphocytes Relative: 11 %
Lymphs Abs: 2.3 10*3/uL (ref 0.7–4.0)
MCH: 29.6 pg (ref 26.0–34.0)
MCHC: 33.2 g/dL (ref 30.0–36.0)
MCV: 88.9 fL (ref 80.0–100.0)
Monocytes Absolute: 0.9 10*3/uL (ref 0.1–1.0)
Monocytes Relative: 4 %
Neutro Abs: 15.4 10*3/uL — ABNORMAL HIGH (ref 1.7–7.7)
Neutrophils Relative %: 77 %
Platelets: 425 10*3/uL — ABNORMAL HIGH (ref 150–400)
RBC: 4.43 MIL/uL (ref 3.87–5.11)
RDW: 12.7 % (ref 11.5–15.5)
WBC: 20.4 10*3/uL — ABNORMAL HIGH (ref 4.0–10.5)
nRBC: 0.2 % (ref 0.0–0.2)

## 2019-12-08 LAB — COMPREHENSIVE METABOLIC PANEL
ALT: 40 U/L (ref 0–44)
AST: 37 U/L (ref 15–41)
Albumin: 2.9 g/dL — ABNORMAL LOW (ref 3.5–5.0)
Alkaline Phosphatase: 61 U/L (ref 38–126)
Anion gap: 12 (ref 5–15)
BUN: 32 mg/dL — ABNORMAL HIGH (ref 8–23)
CO2: 24 mmol/L (ref 22–32)
Calcium: 9.5 mg/dL (ref 8.9–10.3)
Chloride: 99 mmol/L (ref 98–111)
Creatinine, Ser: 1 mg/dL (ref 0.44–1.00)
GFR calc Af Amer: >60 mL/min (ref >60)
GFR calc non Af Amer: 54 mL/min — ABNORMAL LOW (ref >60)
Glucose, Bld: 63 mg/dL — ABNORMAL LOW (ref 70–99)
Potassium: 4.8 mmol/L (ref 3.5–5.1)
Sodium: 135 mmol/L (ref 135–145)
Total Bilirubin: 0.7 mg/dL (ref 0.3–1.2)
Total Protein: 5.9 g/dL — ABNORMAL LOW (ref 6.5–8.1)

## 2019-12-08 LAB — D-DIMER, QUANTITATIVE: D-Dimer, Quant: 11.97 ug/mL-FEU — ABNORMAL HIGH (ref 0.00–0.50)

## 2019-12-08 LAB — C-REACTIVE PROTEIN: CRP: 1 mg/dL — ABNORMAL HIGH (ref <1.0)

## 2019-12-08 MED ORDER — ENOXAPARIN SODIUM 40 MG/0.4ML ~~LOC~~ SOLN
40.0000 mg | Freq: Two times a day (BID) | SUBCUTANEOUS | Status: DC
  Administered 2019-12-08 – 2019-12-10 (×5): 40 mg via SUBCUTANEOUS
  Filled 2019-12-08 (×5): qty 0.4

## 2019-12-08 MED ORDER — INSULIN DETEMIR 100 UNIT/ML ~~LOC~~ SOLN
40.0000 [IU] | Freq: Two times a day (BID) | SUBCUTANEOUS | Status: DC
  Administered 2019-12-08: 40 [IU] via SUBCUTANEOUS
  Filled 2019-12-08 (×2): qty 0.4

## 2019-12-08 MED ORDER — INSULIN DETEMIR 100 UNIT/ML ~~LOC~~ SOLN
30.0000 [IU] | Freq: Two times a day (BID) | SUBCUTANEOUS | Status: DC
  Administered 2019-12-08: 30 [IU] via SUBCUTANEOUS
  Filled 2019-12-08 (×2): qty 0.3

## 2019-12-08 MED ORDER — INSULIN ASPART 100 UNIT/ML ~~LOC~~ SOLN
9.0000 [IU] | Freq: Three times a day (TID) | SUBCUTANEOUS | Status: DC
  Administered 2019-12-08 (×2): 9 [IU] via SUBCUTANEOUS

## 2019-12-08 NOTE — Plan of Care (Signed)
Paged MD to clarify order for levemir 40 units qhs and the evening bs was 73. Awaiting feedback from MD.

## 2019-12-08 NOTE — Progress Notes (Addendum)
[facility] PROGRESS NOTE    Progress Note  Judy [name]  [id number] DOB: 13-Aug-1941 DOA: 12/03/2019 PCP: Patient, No Pcp Per     Brief Narrative:   Judy [name] is an 79 y.o. female past medical history significant for diabetes mellitus type 2, essential hypertension chronic pain presents to the ED with 3 days of shortness of breath the patient reports that she was diagnosed with COVID-19 days ago during this time she been having menorrhagia anorexia with worsening over the next 6 days, and about 4 days prior to admission she started developing shortness of breath fever chills in the ED she was noted to be hypoxic in the 80s placed on 5 L and improved to 91%.  She was also found to be hyponatremic hyperkalemic, SARS-CoV-2 PCR was positive chest x-ray showed right-sided infiltrates  Assessment/Plan:   Acute respiratory failure with hypoxia secondarily to pneumonia due to COVID-19: Judy [name] has been weaned down to room air this morning and she is satting greater than 94%. She has completed her course of IV remdesivir, she has received 5 days of steroids will discontinue steroids as her blood sugars are hard to control.  She did receive a dose of Actemra on 12/03/2019 her inflammatory markers have improved. She has done a great job proning. Her leukocytosis is elevated likely due to steroids she has remained afebrile chest x-ray showed no acute infiltrates. She can be transferred to the third floor and will monitor for 24 hours.  She relates she has been feel little bit nauseated yesterday afternoon and overnight, her oral intake has decreased significantly only about 30%. Ambulating check saturations with ambulation.  Her D-dimer has jumped from 0.7->12, will repeat a D-dimer tomorrow morning and her other inflammatory markers continue to improve CRP today is down to 1.  Chronic kidney disease stage IIIa/hyperkalemia With a baseline creatinine 1.2-1.5, her hyperkalemia  is likely due to decreased oral intake in the setting of NSAID and Aldactone use he was treated with IV fluid hydration and a single dose of Kayexalate her potassium is improved is pending this morning. With a baseline creatinine 1.2-1.5. Multifactorial in the setting of decreased oral intake, NSAID use and Aldactone as well as hemodynamically mediated. KVO IV fluids.  New hyperkalemia: Likely residual effect from NSAID and Aldactone. She was given oral Kayexalate her basic metabolic panel is pending this morning.  Hypovolemic hyponatremia: In the setting of decreased oral intake and diuretic use her sodium improved with IV fluid hydration.  Diabetes mellitus type 2 uncontrolled with hyperglycemia/chronic kidney disease stage IIIa: As we are decreasing her steroids her blood glucose becoming easier to control, will DC dexamethasone today. We will start to titrate down her long-acting insulin continue sliding scale resistant.  Essential HTN (hypertension) Seems to be well controlled continue diltiazem and continue to hold Aldactone.  Chronic pain/anxiety/depression: Continue current home meds narcotics and low-dose benzodiazepine nightly.   Class 1 obesity with serious comorbidity and body mass index (BMI) of 33.0 to 33.9 in adult     Estimated body mass index is 35 kg/m as calculated from the following:   Height as of this encounter: 5' (1.524 m).   Weight as of this encounter: 81.3 kg.    DVT prophylaxis: lovenox Family Communication:none Disposition Plan/Barrier to D/C: Home in 24 to 48 hours. Code Status:     Code Status Orders  (From admission, onward)           Start  Ordered   12/03/19 1659  Do not attempt resuscitation (DNR)  Continuous    Question Answer Comment  In the event of cardiac or respiratory ARREST Do not call a "code blue"   In the event of cardiac or respiratory ARREST Do not perform Intubation, CPR, defibrillation or ACLS   In the event of  cardiac or respiratory ARREST Use medication by any route, position, wound care, and other measures to relive pain and suffering. May use oxygen, suction and manual treatment of airway obstruction as needed for comfort.      12/03/19 1658           Code Status History     Date Active Date Inactive Code Status Order ID Comments User Context   11/19/2016 1804 11/21/2016 1702 Full Code [id number]  Jovita Gamma, MD Inpatient   03/21/2016 0903 03/24/2016 1731 Full Code [id number]  Sid Falcon, MD Inpatient   Advance Care Planning Activity      Advance Directive Documentation      Most Recent Value  Type of Advance Directive  Out of facility DNR (pink MOST or yellow form)  Pre-existing out of facility DNR order (yellow form or pink MOST form)  --  "MOST" Form in Place?  --         IV Access:    Peripheral IV   Procedures and diagnostic studies:   DG CHEST PORT 1 VIEW  Result Date: 12/06/2019 CLINICAL DATA:  Dyspnea EXAM: PORTABLE CHEST 1 VIEW COMPARISON:  12/03/2019 FINDINGS: Heart size is enlarged, stable. Improving aeration of the bilateral lungs compared to prior with resolving bilateral airspace opacities. Prominent interstitial markings bilaterally versus, although improved. No pleural effusion or pneumothorax. IMPRESSION: Improving aeration of the lungs with resolving bilateral airspace opacities. Electronically Signed   By: Davina Poke D.O.   On: 12/06/2019 12:38     Medical Consultants:    None.  Anti-Infectives:   IV remdesivir  Subjective:    Judy [name] is better than yesterday she has a better appetite today.  Objective:    Vitals:   12/07/19 1800 12/07/19 2035 12/08/19 0014 12/08/19 0326  BP:  (!) 143/77 (!) 155/78 133/64  Pulse: 81 64 87 72  Resp: (!) 22 16 20 20   Temp:  97.7 F (36.5 C) (!) 97.5 F (36.4 C) (!) 97.5 F (36.4 C)  TempSrc:  Oral Oral Tympanic  SpO2: 95% 95% 95% 96%  Weight:      Height:        SpO2: 96 % O2 Flow Rate (L/min): 5 L/min   Intake/Output Summary (Last 24 hours) at 12/08/2019 0719 Last data filed at 12/07/2019 1900 Gross per 24 hour  Intake 720 ml  Output 650 ml  Net 70 ml   Filed Weights   12/03/19 2300 12/06/19 0500 12/07/19 0500  Weight: 81.1 kg 81 kg 81.3 kg    Exam: General exam: In no acute distress. Respiratory system: Good air movement and diffuse crackles bilaterally. Cardiovascular system: S1 & S2 heard, RRR. No JVD. Gastrointestinal system: Abdomen is nondistended, soft and nontender.  Central nervous system: Alert and oriented. No focal neurological deficits. Extremities: No pedal edema. Skin: No rashes, lesions or ulcers Psychiatry: Judgement and insight appear normal. Mood & affect appropriate.    Data Reviewed:    Labs: Basic Metabolic Panel: Recent Labs  Lab 12/05/19 0423 12/05/19 0930 12/05/19 1455 12/06/19 0130 12/07/19 0623  NA 128* 133* 132* 134* 134*  K 4.4 4.7  4.6 5.2* 4.4  CL 98 102 102 103 98  CO2 20* 20* 18* 21* 24  GLUCOSE 149* 236* 325* 255* 112*  BUN 29* 32* 34* 34* 32*  CREATININE 1.06* 1.06* 1.13* 1.17* 1.01*  CALCIUM 8.3* 8.7* 8.7* 9.3 9.2   GFR Estimated Creatinine Clearance: 44 mL/min (A) (by C-G formula based on SCr of 1.01 mg/dL (H)). Liver Function Tests: Recent Labs  Lab 12/03/19 1450 12/04/19 0814 12/05/19 0423 12/06/19 0130 12/07/19 0623  AST 41 30 25 23 23   ALT 25 26 24 25 28   ALKPHOS 52 56 64 84 65  BILITOT 0.6 0.2* 0.3 0.2* 0.7  PROT 6.9 6.4* 5.4* 6.0* 5.9*  ALBUMIN 2.9* 2.5* 2.4* 2.7* 2.8*   No results for input(s): LIPASE, AMYLASE in the last 168 hours. No results for input(s): AMMONIA in the last 168 hours. Coagulation profile No results for input(s): INR, PROTIME in the last 168 hours. COVID-19 Labs  Recent Labs    12/06/19 0130 12/07/19 0623  DDIMER 1.16* 0.79*  CRP 4.1* 1.6*    Lab Results  Component Value Date   SARSCOV2NAA Not Detected 10/20/2019     CBC: Recent Labs  Lab 12/03/19 1450 12/03/19 1812 12/04/19 0814 12/05/19 0423 12/06/19 0130 12/07/19 0623  WBC 10.2 10.4 9.0 15.4* 19.1* 18.7*  NEUTROABS 8.8*  --  7.8* 12.6* 15.3* 14.5*  HGB 13.4 12.5 12.8 12.1 12.3 12.5  HCT 40.5 37.2 37.8 36.2 38.1 37.5  MCV 90.2 89.2 88.3 89.6 91.1 88.7  PLT 272 274 291 334 402* 444*   Cardiac Enzymes: No results for input(s): CKTOTAL, CKMB, CKMBINDEX, TROPONINI in the last 168 hours. BNP (last 3 results) No results for input(s): PROBNP in the last 8760 hours. CBG: Recent Labs  Lab 12/06/19 2107 12/07/19 0833 12/07/19 1120 12/07/19 1739 12/07/19 1948  GLUCAP 381* 103* 149* 185* 225*   D-Dimer: Recent Labs    12/06/19 0130 12/07/19 0623  DDIMER 1.16* 0.79*   Hgb A1c: No results for input(s): HGBA1C in the last 72 hours. Lipid Profile: No results for input(s): CHOL, HDL, LDLCALC, TRIG, CHOLHDL, LDLDIRECT in the last 72 hours. Thyroid function studies: No results for input(s): TSH, T4TOTAL, T3FREE, THYROIDAB in the last 72 hours.  Invalid input(s): FREET3 Anemia work up: No results for input(s): VITAMINB12, FOLATE, FERRITIN, TIBC, IRON, RETICCTPCT in the last 72 hours. Sepsis Labs: Recent Labs  Lab 12/03/19 1450 12/03/19 1608 12/04/19 0814 12/05/19 0423 12/06/19 0130 12/07/19 0623  PROCALCITON 0.20  --  <0.10 <0.10  --   --   WBC 10.2  --  9.0 15.4* 19.1* 18.7*  LATICACIDVEN  --  1.8  --   --   --   --    Microbiology Recent Results (from the past 240 hour(s))  Blood Culture (routine x 2)     Status: None (Preliminary result)   Collection Time: 12/03/19  2:50 PM   Specimen: BLOOD  Result Value Ref Range Status   Specimen Description   Final    BLOOD RIGHT ANTECUBITAL Performed at [facility], [place] [address], [place], [place] [address]    Special Requests   Final    BOTTLES DRAWN AEROBIC AND ANAEROBIC Blood Culture adequate volume Performed at [facility]  [address], [place], [place] [address]    Culture   Final    NO GROWTH 4 DAYS Performed at [facility], [place] [address] [place], [place] [address]    Report Status PENDING  Incomplete  Blood Culture (routine x 2)     Status: None (Preliminary result)   Collection Time: 12/03/19  4:20 PM   Specimen: BLOOD  Result Value Ref Range Status   Specimen Description   Final    BLOOD LEFT ANTECUBITAL Performed at [facility] [address] [place], [place] [address]    Special Requests   Final    BOTTLES DRAWN AEROBIC AND ANAEROBIC Blood Culture results may not be optimal due to an inadequate volume of blood received in culture bottles Performed at [facility] [address], [place], [place] [address]    Culture   Final    NO GROWTH 4 DAYS Performed at [facility], [place] [address] [place], [place] [address]    Report Status PENDING  Incomplete     Medications:   . albuterol  2 puff Inhalation Q6H  . dexamethasone (DECADRON) injection  6 mg Intravenous Q12H  . diltiazem  240 mg Oral Daily  . enoxaparin (LOVENOX) injection  40 mg Subcutaneous Q24H  . insulin aspart  0-20 Units Subcutaneous TID WC  . insulin aspart  0-5 Units Subcutaneous QHS  . insulin aspart  15 Units Subcutaneous TID WC  . insulin detemir  65 Units Subcutaneous BID   Continuous Infusions:    LOS: 5 days   Charlynne Cousins  [facility]  12/08/2019, 7:19 AM

## 2019-12-09 ENCOUNTER — Inpatient Hospital Stay (HOSPITAL_COMMUNITY): Payer: Medicare Other

## 2019-12-09 DIAGNOSIS — I10 Essential (primary) hypertension: Secondary | ICD-10-CM

## 2019-12-09 DIAGNOSIS — N183 Chronic kidney disease, stage 3 unspecified: Secondary | ICD-10-CM

## 2019-12-09 DIAGNOSIS — J1282 Pneumonia due to coronavirus disease 2019: Secondary | ICD-10-CM

## 2019-12-09 DIAGNOSIS — E1122 Type 2 diabetes mellitus with diabetic chronic kidney disease: Secondary | ICD-10-CM

## 2019-12-09 LAB — D-DIMER, QUANTITATIVE: D-Dimer, Quant: 1.03 ug/mL-FEU — ABNORMAL HIGH (ref 0.00–0.50)

## 2019-12-09 LAB — GLUCOSE, CAPILLARY
Glucose-Capillary: 54 mg/dL — ABNORMAL LOW (ref 70–99)
Glucose-Capillary: 103 mg/dL — ABNORMAL HIGH (ref 70–99)
Glucose-Capillary: 138 mg/dL — ABNORMAL HIGH (ref 70–99)
Glucose-Capillary: 132 mg/dL — ABNORMAL HIGH (ref 70–99)
Glucose-Capillary: 83 mg/dL (ref 70–99)

## 2019-12-09 LAB — C-REACTIVE PROTEIN: CRP: 0.9 mg/dL (ref <1.0)

## 2019-12-09 MED ORDER — PHENOL 1.4 % MT LIQD
1.0000 | OROMUCOSAL | Status: DC | PRN
  Administered 2019-12-09: 1 via OROMUCOSAL
  Filled 2019-12-09: qty 177

## 2019-12-09 MED ORDER — MENTHOL 3 MG MT LOZG
1.0000 | LOZENGE | OROMUCOSAL | Status: DC | PRN
  Filled 2019-12-09: qty 9

## 2019-12-09 NOTE — Plan of Care (Signed)
   Vital Signs MEWS/VS Documentation       12/08/2019 1700 12/08/2019 1953 12/08/2019 2000 12/09/2019 0300   MEWS Score:  0  0  0  0   MEWS Score Color:  Green  Green  Green  Green   Resp:  16  16  --  --   Pulse:  63  70  --  --   BP:  (!) 145/94  (!) 161/77  --  --   Temp:  98.3 F (36.8 C)  98 F (36.7 C)  --  --   O2 Device:  Room [employer]  --  --   Level of Consciousness:  Alert  --  Alert  Alert        Forgetful, bsc use    Judy [name] 12/09/2019,3:42 AM

## 2019-12-09 NOTE — Progress Notes (Signed)
Physical Therapy Treatment Patient Details Name: Judy [name] MRN: [id number] DOB: [birth year] Today's Date: 12/09/2019    History of Present Illness 79 year old female admitted with COVID, and right sided infiltrates in lung, per chest x ray, hyponatremia, and hyperkalemia; PMH includes DM2, HTN, and chronic pain.    PT Comments    This is the pt's second hallway walk today.  She is slow, but steady with the RW.  UE/LE HEP given with instructions to avoid her R arm as it is injured.  Pt was able to complete HEP, breathing exercises without difficulty.  HHPT follow up remains appropriate at discharge.  PT will continue to follow acutely for safe mobility progression   Follow Up Recommendations  Home health PT     Equipment Recommendations  Other (comment)(pt reports she has a walker)    Recommendations for Other Services   NA     Precautions / Restrictions Precautions Precautions: Fall Precaution Comments: h/o falls    Mobility  Bed Mobility Overal bed mobility: Modified Independent             General bed mobility comments: light use of rail  Transfers Overall transfer level: Needs assistance Equipment used: Rolling walker (2 wheeled) Transfers: Sit to/from Stand Sit to Stand: Supervision         General transfer comment: spervision for safety  Ambulation/Gait Ambulation/Gait assistance: Supervision Gait Distance (Feet): 85 Feet Assistive device: Rolling walker (2 wheeled) Gait Pattern/deviations: Step-through pattern;Decreased dorsiflexion - right Gait velocity: decreased Gait velocity interpretation: <1.8 ft/sec, indicate of risk for recurrent falls            Balance Overall balance assessment: Needs assistance Sitting-balance support: Feet supported;Bilateral upper extremity supported Sitting balance-Leahy Scale: Good     Standing balance support: Bilateral upper extremity supported Standing balance-Leahy Scale: Poor Standing balance  comment: needs external support                            Cognition Arousal/Alertness: Awake/alert Behavior During Therapy: Anxious Overall Cognitive Status: Impaired/Different from baseline Area of Impairment: Problem solving                             Problem Solving: Slow processing General Comments: Pt seems a bit slow to process, unsure of baseline vs "COVID fog"      Exercises General Exercises - Upper Extremity Shoulder Horizontal ABduction: AROM;Left;10 reps;Seated;Theraband Theraband Level (Shoulder Horizontal Abduction): Level 1 (Yellow)(left only due to R shoulder injury) Elbow Flexion: AROM;Left;10 reps;Seated;Theraband Theraband Level (Elbow Flexion): Level 1 (Yellow) Elbow Extension: AROM;Left;10 reps;Seated;Theraband Theraband Level (Elbow Extension): Level 1 (Yellow) General Exercises - Lower Extremity Long Arc Quad: AROM;Both;10 reps;Seated Hip ABduction/ADduction: AROM;10 reps;Both Straight Leg Raises: AROM;10 reps;Both Hip Flexion/Marching: AROM;10 reps;Both;Seated Other Exercises Other Exercises: IS and FV x 10 each, IS 1750 mL max volume        Pertinent Vitals/Pain Pain Assessment: No/denies pain           PT Goals (current goals can now be found in the care plan section) Acute Rehab PT Goals Patient Stated Goal: pt would like to go home, but is scared Progress towards PT goals: Progressing toward goals    Frequency    Min 3X/week      PT Plan Current plan remains appropriate   AM-PAC PT "6 Clicks" Mobility   Outcome Measure  Help needed turning from your back  to your side while in a flat bed without using bedrails?: A Little Help needed moving from lying on your back to sitting on the side of a flat bed without using bedrails?: A Little Help needed moving to and from a bed to a chair (including a wheelchair)?: None Help needed standing up from a chair using your arms (e.g., wheelchair or bedside chair)?:  None Help needed to walk in hospital room?: None Help needed climbing 3-5 steps with a railing? : A Little 6 Click Score: 21    End of Session   Activity Tolerance: Patient tolerated treatment well Patient left: in chair;with call bell/phone within reach Nurse Communication: Mobility status PT Visit Diagnosis: Muscle weakness (generalized) (M62.81);Unsteadiness on feet (R26.81)     Time: [id number] PT Time Calculation (min) (ACUTE ONLY): 33 min  Charges:  $Gait Training: 8-22 mins $Therapeutic Exercise: 8-22 mins                    Verdene Lennert, PT, DPT  Acute Rehabilitation [phone] pager #[phone] office  @ [facility]: [phone]   12/09/2019, 5:37 PM

## 2019-12-09 NOTE — Plan of Care (Signed)
  Problem: Education: Goal: Knowledge of risk factors and measures for prevention of condition will improve Outcome: Progressing   Problem: Coping: Goal: Psychosocial and spiritual needs will be supported Outcome: Progressing   Problem: Respiratory: Goal: Will maintain a patent airway Outcome: Progressing Goal: Complications related to the disease process, condition or treatment will be avoided or minimized Outcome: Progressing   

## 2019-12-09 NOTE — Progress Notes (Signed)
[facility] PROGRESS NOTE    Progress Note  Judy [name]  [id number] DOB: 1941/04/03 DOA: 12/03/2019 PCP: Patient, No Pcp Per     Brief Narrative:   Judy [name] is an 79 y.o. female past medical history significant for diabetes mellitus type 2, essential hypertension chronic pain presents to the ED with 3 days of shortness of breath the patient reports that she was diagnosed with COVID-19 days ago during this time she been having menorrhagia anorexia with worsening over the next 6 days, and about 4 days prior to admission she started developing shortness of breath fever chills in the ED she was noted to be hypoxic in the 80s placed on 5 L and improved to 91%.  She was also found to be hyponatremic hyperkalemic, SARS-CoV-2 PCR was positive chest x-ray showed right-sided infiltrates  Assessment/Plan:   Acute respiratory failure with hypoxia secondarily to pneumonia due to COVID-19:  Recent Labs  Lab 12/03/19 1450 12/04/19 0814 12/05/19 0423 12/06/19 0130 12/07/19 0623 12/08/19 0620 12/09/19 0135  DDIMER 3.24* 2.09* 1.43* 1.16* 0.79* 11.97* 1.03*  FERRITIN 652*  --   --   --   --   --   --   CRP 16.8* 15.4* 8.1* 4.1* 1.6* 1.0* 0.9  ALT 25 26 24 25 28  40  --   PROCALCITON 0.20 <0.10 <0.10  --   --   --   --     Patient was hospitalized and placed on steroids and remdesivir.  She also received Actemra. She has been weaned down to room air.  Patient feels better.  Continue with incentive spirometry, prone positioning, out of bed to chair.  Patient states that she feels anxious and nervous this morning.  Patient was reassured.  Her D-dimer has improved to 1.03.  Reason for the high D-dimer yesterday is not clear.  CRP is normal today.  Procalcitonin was less than 0.1.  She will need to be mobilized a bit more.  She tells me that she lives in apartment with a roommate.  Seen by physical therapy on 1/2.  Home health was recommended.  She needs to be ambulated again.  Check  ambulatory pulse oximetry as well.  Chronic kidney disease stage IIIa/hyperkalemia With a baseline creatinine 1.2-1.5.  Renal function has been stable.  Hyperkalemia improved with Kayexalate.  Continue to monitor urine output.  Avoid nephrotoxic agents including NSAIDs.  She was also on Aldactone which is being held.  Hypovolemic hyponatremia: In the setting of decreased oral intake and diuretic use. Her sodium improved with IV fluid hydration.  Diabetes mellitus type 2 uncontrolled with hyperglycemia/chronic kidney disease stage IIIa: Occasional episodes of hypoglycemia noted.  Likely because of steroid taper.  We will stop her long-acting insulin.  Continue SSI.  HbA1c was 8.1.   Essential HTN (hypertension) Seems to be well controlled continue diltiazem and continue to hold Aldactone.  Chronic pain/anxiety/depression: Continue current home meds narcotics and low-dose benzodiazepine nightly.  Obesity Estimated body mass index is 35 kg/m as calculated from the following:   Height as of this encounter: 5' (1.524 m).   Weight as of this encounter: 81.3 kg.    DVT prophylaxis: lovenox CODE STATUS: Full code Family Communication: We will attempt to call family later today Disposition Plan: Needs to be mobilized again.  Hopefully home in 24 hours.   IV Access:    Peripheral IV   Procedures and diagnostic studies:   No results found.   Medical Consultants:  None.  Anti-Infectives:   IV remdesivir  Subjective:   Patient states that she is feeling anxious today.  Could not answer orientation questions this morning.  Otherwise she feels well.  Denies any chest pain.  No shortness of breath this morning.  Objective:    Vitals:   12/08/19 1700 12/08/19 1953 12/09/19 0425 12/09/19 0800  BP: (!) 145/94 (!) 161/77 131/66 (!) 131/52  Pulse: 63 70 72 71  Resp: 16 16 16 18   Temp: 98.3 F (36.8 C) 98 F (36.7 C) 97.6 F (36.4 C) 97.7 F (36.5 C)  TempSrc: Oral  Oral Oral Oral  SpO2: 93% 97% 95% 95%  Weight:      Height:       SpO2: 95 % O2 Flow Rate (L/min): 5 L/min   Intake/Output Summary (Last 24 hours) at 12/09/2019 1420 Last data filed at 12/09/2019 0830 Gross per 24 hour  Intake 504 ml  Output --  Net 504 ml   Filed Weights   12/03/19 2300 12/06/19 0500 12/07/19 0500  Weight: 81.1 kg 81 kg 81.3 kg    General appearance: Awake alert.  In no distress Resp: Normal effort.  Few crackles at the bases.  No wheezing or rhonchi. Cardio: S1-S2 is normal regular.  No S3-S4.  No rubs murmurs or bruit GI: Abdomen is soft.  Nontender nondistended.  Bowel sounds are present normal.  No masses organomegaly Extremities: No edema.  Full range of motion of lower extremities. Neurologic: No obvious focal neurological deficits.    Data Reviewed:    Labs: Basic Metabolic Panel: Recent Labs  Lab 12/05/19 0930 12/05/19 1455 12/06/19 0130 12/07/19 0623 12/08/19 0620  NA 133* 132* 134* 134* 135  K 4.7 4.6 5.2* 4.4 4.8  CL 102 102 103 98 99  CO2 20* 18* 21* 24 24  GLUCOSE 236* 325* 255* 112* 63*  BUN 32* 34* 34* 32* 32*  CREATININE 1.06* 1.13* 1.17* 1.01* 1.00  CALCIUM 8.7* 8.7* 9.3 9.2 9.5   GFR Estimated Creatinine Clearance: 44.5 mL/min (by C-G formula based on SCr of 1 mg/dL). Liver Function Tests: Recent Labs  Lab 12/04/19 0814 12/05/19 0423 12/06/19 0130 12/07/19 0623 12/08/19 0620  AST 30 25 23 23  37  ALT 26 24 25 28  40  ALKPHOS 56 64 84 65 61  BILITOT 0.2* 0.3 0.2* 0.7 0.7  PROT 6.4* 5.4* 6.0* 5.9* 5.9*  ALBUMIN 2.5* 2.4* 2.7* 2.8* 2.9*   COVID-19 Labs  Recent Labs    12/07/19 0623 12/08/19 0620 12/09/19 0135  DDIMER 0.79* 11.97* 1.03*  CRP 1.6* 1.0* 0.9    Lab Results  Component Value Date   SARSCOV2NAA Not Detected 10/20/2019    CBC: Recent Labs  Lab 12/04/19 0814 12/05/19 0423 12/06/19 0130 12/07/19 0623 12/08/19 0620  WBC 9.0 15.4* 19.1* 18.7* 20.4*  NEUTROABS 7.8* 12.6* 15.3* 14.5* 15.4*  HGB  12.8 12.1 12.3 12.5 13.1  HCT 37.8 36.2 38.1 37.5 39.4  MCV 88.3 89.6 91.1 88.7 88.9  PLT 291 334 402* 444* 425*   CBG: Recent Labs  Lab 12/08/19 1748 12/08/19 2034 12/09/19 0747 12/09/19 0834 12/09/19 1156  GLUCAP 169* 73 54* 103* 138*   D-Dimer: Recent Labs    12/08/19 0620 12/09/19 0135  DDIMER 11.97* 1.03*   Sepsis Labs: Recent Labs  Lab 12/03/19 1450 12/03/19 1608 12/04/19 0814 12/05/19 0423 12/06/19 0130 12/07/19 0623 12/08/19 0620  PROCALCITON 0.20  --  <0.10 <0.10  --   --   --   WBC  10.2  --  9.0 15.4* 19.1* 18.7* 20.4*  LATICACIDVEN  --  1.8  --   --   --   --   --    Microbiology Recent Results (from the past 240 hour(s))  Blood Culture (routine x 2)     Status: None   Collection Time: 12/03/19  2:50 PM   Specimen: BLOOD  Result Value Ref Range Status   Specimen Description   Final    BLOOD RIGHT ANTECUBITAL Performed at [facility] [address], [place], [place] [address]    Special Requests   Final    BOTTLES DRAWN AEROBIC AND ANAEROBIC Blood Culture adequate volume Performed at [facility] [address] [place], [place] [address]    Culture   Final    NO GROWTH 5 DAYS Performed at [facility], [place] [address], [place], [place] [address]    Report Status 12/08/2019 FINAL  Final  Blood Culture (routine x 2)     Status: None   Collection Time: 12/03/19  4:20 PM   Specimen: BLOOD  Result Value Ref Range Status   Specimen Description   Final    BLOOD LEFT ANTECUBITAL Performed at [facility] [address] [place], [place] [address]    Special Requests   Final    BOTTLES DRAWN AEROBIC AND ANAEROBIC Blood Culture results may not be optimal due to an inadequate volume of blood received in culture bottles Performed at [facility] [address] [place],  [address]    Culture   Final    NO GROWTH 5 DAYS Performed at [facility],  [place] [address], [place],  [address]    Report Status 12/08/2019 FINAL  Final     Medications:   . albuterol  2 puff Inhalation Q6H  . diltiazem  240 mg Oral Daily  . enoxaparin (LOVENOX) injection  40 mg Subcutaneous Q12H  . insulin aspart  0-20 Units Subcutaneous TID WC  . insulin aspart  0-5 Units Subcutaneous QHS   Continuous Infusions:    LOS: 6 days   [employer]  [facility]  12/09/2019, 2:20 PM

## 2019-12-10 LAB — GLUCOSE, CAPILLARY
Glucose-Capillary: 66 mg/dL — ABNORMAL LOW (ref 70–99)
Glucose-Capillary: 133 mg/dL — ABNORMAL HIGH (ref 70–99)
Glucose-Capillary: 90 mg/dL (ref 70–99)
Glucose-Capillary: 212 mg/dL — ABNORMAL HIGH (ref 70–99)

## 2019-12-10 LAB — BASIC METABOLIC PANEL
Anion gap: 11 (ref 5–15)
BUN: 37 mg/dL — ABNORMAL HIGH (ref 8–23)
CO2: 28 mmol/L (ref 22–32)
Calcium: 9.3 mg/dL (ref 8.9–10.3)
Chloride: 93 mmol/L — ABNORMAL LOW (ref 98–111)
Creatinine, Ser: 1.07 mg/dL — ABNORMAL HIGH (ref 0.44–1.00)
GFR calc Af Amer: 58 mL/min — ABNORMAL LOW (ref >60)
GFR calc non Af Amer: 50 mL/min — ABNORMAL LOW (ref >60)
Glucose, Bld: 68 mg/dL — ABNORMAL LOW (ref 70–99)
Potassium: 4.9 mmol/L (ref 3.5–5.1)
Sodium: 132 mmol/L — ABNORMAL LOW (ref 135–145)

## 2019-12-10 LAB — CBC
HCT: 42.9 % (ref 36.0–46.0)
Hemoglobin: 14.2 g/dL (ref 12.0–15.0)
MCH: 30.5 pg (ref 26.0–34.0)
MCHC: 33.1 g/dL (ref 30.0–36.0)
MCV: 92.3 fL (ref 80.0–100.0)
Platelets: 506 10*3/uL — ABNORMAL HIGH (ref 150–400)
RBC: 4.65 MIL/uL (ref 3.87–5.11)
RDW: 13.2 % (ref 11.5–15.5)
WBC: 14 10*3/uL — ABNORMAL HIGH (ref 4.0–10.5)
nRBC: 0.2 % (ref 0.0–0.2)

## 2019-12-10 NOTE — Care Management Important Message (Signed)
Important Message  Patient Details  Name: Judy [name] MRN: [id number] Date of Birth: 1941/01/20   Medicare Important Message Given:  Yes - Important Message mailed due to current National Emergency  Verbal consent obtained due to current National Emergency  Relationship to patient: Other Realative (must comment) Contact Name: Sherre Lain Call Date: 12/10/19  Time: 1239 Phone: [phone] Outcome: Spoke with contact Important Message mailed to: Patient address on file    Delorse Lek 12/10/2019, 12:40 PM

## 2019-12-10 NOTE — Progress Notes (Signed)
Patient discharged to home with family care. VSS, RA, no complaints of pain or discomfort. PIVs discontinued without complications. Received discharged instructions and verbalized understanding, had no further questions. Left the unit on wheelchair accompanied by staff. Transported home with family via private vehicle. All personal belongings taken with patient.

## 2019-12-10 NOTE — TOC Transition Note (Addendum)
Transition of Care [facility]) - CM/SW Discharge Note Marvetta Gibbons RN, BSN Transitions of Care Unit 4E- RN Case Manager [place] [facility] Coverage for [place]) [phone]   Patient Details  Name: Judy [name] MRN: [id number] Date of Birth: 07/05/1941  Transition of Care [facility]) CM/SW Contact:  Dawayne Patricia, RN Phone Number: 12/10/2019, 3:36 PM   Clinical Narrative:    Pt stable for transition home today, no PCP listed- call made to [facility] for telephonic f/u appointment- appointment made for Jan. 19 at 9:30am with Dr. Joya Gaskins. Info placed on AVS. Orders also placed for [facility] and DME needs- attempted call to son however wrong # listed in epic- spoke with bedside RN who provided correct # for son Leroy Sea- phone # updated in epic to correct #- TC made to Leroy Sea [phone])- to discuss transition needs- per Leroy Sea he is prepared for patient to return home today- with roommate who lives with pt. Address confirmed with son along with patient's phone #. Choice offered for [facility] and DME needs- will have 3n1 delivered to room by [facility] from [facility] prior to discharge- referred son to Medicare.gove site-and reviewed list with son over phone- per son he does not have a preference for [facility] agency- call made to [facility] with Alvis Lemmings to see if they can accept referral for HHPT/OT needs. - awaiting return call.  Per son he will provide transport home once [facility] and DME has been arranged- will contact bedside RN once [facility] confirmed.  1600- update- have confirmed with Tommi Rumps at [facility] that they can accept referral for [facility] needs- with start of care this weekend- at least by Jan. 11. - have notified bedside RN that pt is ready for discharge and son can be contacted for transport.    Final next level of care: [place] Barriers to Discharge: No Barriers Identified   Patient Goals and CMS Choice Patient states their goals for this hospitalization and ongoing recovery are:: to return home CMS Medicare.gov Compare Post Acute Care  list provided to:: Patient Represenative (must comment)(son) Choice offered to / list presented to : Adult Children  Discharge Placement               Home with [facility]        Discharge Plan and Services   Discharge Planning Services: CM Consult Post Acute Care Choice: Home Health, Durable Medical Equipment            DME Agency: [facility] Date DME Agency Contacted: 12/10/19 Time DME Agency Contacted: [phone] Representative spoke with at DME Agency: Alma Friendly (AC)(GV AC to take from [employer] storage at [facility]) [place] Arranged: PT, OT   Date [place]: 12/10/19 Time [place]: [id number] Representative spoke with at [place]: [place] ([place]) Interventions     Readmission Risk Interventions Readmission Risk Prevention Plan 12/10/2019  Post Dischage Appt Complete  Medication Screening Complete  Transportation Screening Complete  Some recent data might be hidden

## 2019-12-10 NOTE — Progress Notes (Signed)
Ambulation Note  Saturation Pre: 94% on RA  Ambulation Distance: 230 ft  Saturation During Ambulation: 94% on RA  Notes: Pt used RW. Tolerated well. Gait slow and steady. Pt returned to bed with call bell and phone within reach.   Philis Kendall, MS, ACSM CEP 10:30 AM 12/10/2019

## 2019-12-10 NOTE — Discharge Instructions (Signed)
COVID-19 COVID-19 is a respiratory infection that is caused by a virus called severe acute respiratory syndrome coronavirus 2 (SARS-CoV-2). The disease is also known as coronavirus disease or novel coronavirus. In some people, the virus may not cause any symptoms. In others, it may cause a serious infection. The infection can get worse quickly and can lead to complications, such as:  Pneumonia, or infection of the lungs.  Acute respiratory distress syndrome or ARDS. This is a condition in which fluid build-up in the lungs prevents the lungs from filling with air and passing oxygen into the blood.  Acute respiratory failure. This is a condition in which there is not enough oxygen passing from the lungs to the body or when carbon dioxide is not passing from the lungs out of the body.  Sepsis or septic shock. This is a serious bodily reaction to an infection.  Blood clotting problems.  Secondary infections due to bacteria or fungus.  Organ failure. This is when your body's organs stop working. The virus that causes COVID-19 is contagious. This means that it can spread from person to person through droplets from coughs and sneezes (respiratory secretions). What are the causes? This illness is caused by a virus. You may catch the virus by:  Breathing in droplets from an infected person. Droplets can be spread by a person breathing, speaking, singing, coughing, or sneezing.  Touching something, like a table or a doorknob, that was exposed to the virus (contaminated) and then touching your mouth, nose, or eyes. What increases the risk? Risk for infection You are more likely to be infected with this virus if you:  Are within 6 feet (2 meters) of a person with COVID-19.  Provide care for or live with a person who is infected with COVID-19.  Spend time in crowded indoor spaces or live in shared housing. Risk for serious illness You are more likely to become seriously ill from the virus if you:   Are 50 years of age or older. The higher your age, the more you are at risk for serious illness.  Live in a nursing home or long-term care facility.  Have cancer.  Have a long-term (chronic) disease such as: ? Chronic lung disease, including chronic obstructive pulmonary disease or asthma. ? A long-term disease that lowers your body's ability to fight infection (immunocompromised). ? Heart disease, including heart failure, a condition in which the arteries that lead to the heart become narrow or blocked (coronary artery disease), a disease which makes the heart muscle thick, weak, or stiff (cardiomyopathy). ? Diabetes. ? Chronic kidney disease. ? Sickle cell disease, a condition in which red blood cells have an abnormal "sickle" shape. ? Liver disease.  Are obese. What are the signs or symptoms? Symptoms of this condition can range from mild to severe. Symptoms may appear any time from 2 to 14 days after being exposed to the virus. They include:  A fever or chills.  A cough.  Difficulty breathing.  Headaches, body aches, or muscle aches.  Runny or stuffy (congested) nose.  A sore throat.  New loss of taste or smell. Some people may also have stomach problems, such as nausea, vomiting, or diarrhea. Other people may not have any symptoms of COVID-19. How is this diagnosed? This condition may be diagnosed based on:  Your signs and symptoms, especially if: ? You live in an area with a COVID-19 outbreak. ? You recently traveled to or from an area where the virus is common. ? You   provide care for or live with a person who was diagnosed with COVID-19. ? You were exposed to a person who was diagnosed with COVID-19.  A physical exam.  Lab tests, which may include: ? Taking a sample of fluid from the back of your nose and throat (nasopharyngeal fluid), your nose, or your throat using a swab. ? A sample of mucus from your lungs (sputum). ? Blood tests.  Imaging tests, which  may include, X-rays, CT scan, or ultrasound. How is this treated? At present, there is no medicine to treat COVID-19. Medicines that treat other diseases are being used on a trial basis to see if they are effective against COVID-19. Your health care provider will talk with you about ways to treat your symptoms. For most people, the infection is mild and can be managed at home with rest, fluids, and over-the-counter medicines. Treatment for a serious infection usually takes places in a hospital intensive care unit (ICU). It may include one or more of the following treatments. These treatments are given until your symptoms improve.  Receiving fluids and medicines through an IV.  Supplemental oxygen. Extra oxygen is given through a tube in the nose, a face mask, or a hood.  Positioning you to lie on your stomach (prone position). This makes it easier for oxygen to get into the lungs.  Continuous positive airway pressure (CPAP) or bi-level positive airway pressure (BPAP) machine. This treatment uses mild air pressure to keep the airways open. A tube that is connected to a motor delivers oxygen to the body.  Ventilator. This treatment moves air into and out of the lungs by using a tube that is placed in your windpipe.  Tracheostomy. This is a procedure to create a hole in the neck so that a breathing tube can be inserted.  Extracorporeal membrane oxygenation (ECMO). This procedure gives the lungs a chance to recover by taking over the functions of the heart and lungs. It supplies oxygen to the body and removes carbon dioxide. Follow these instructions at home: Lifestyle  If you are sick, stay home except to get medical care. Your health care provider will tell you how long to stay home. Call your health care provider before you go for medical care.  Rest at home as told by your health care provider.  Do not use any products that contain nicotine or tobacco, such as cigarettes, e-cigarettes, and  chewing tobacco. If you need help quitting, ask your health care provider.  Return to your normal activities as told by your health care provider. Ask your health care provider what activities are safe for you. General instructions  Take over-the-counter and prescription medicines only as told by your health care provider.  Drink enough fluid to keep your urine pale yellow.  Keep all follow-up visits as told by your health care provider. This is important. How is this prevented?  There is no vaccine to help prevent COVID-19 infection. However, there are steps you can take to protect yourself and others from this virus. To protect yourself:   Do not travel to areas where COVID-19 is a risk. The areas where COVID-19 is reported change often. To identify high-risk areas and travel restrictions, check the CDC travel website: wwwnc.cdc.gov/travel/notices  If you live in, or must travel to, an area where COVID-19 is a risk, take precautions to avoid infection. ? Stay away from people who are sick. ? Wash your hands often with soap and water for 20 seconds. If soap and water   are not available, use an alcohol-based hand sanitizer. ? Avoid touching your mouth, face, eyes, or nose. ? Avoid going out in public, follow guidance from your state and local health authorities. ? If you must go out in public, wear a cloth face covering or face mask. Make sure your mask covers your nose and mouth. ? Avoid crowded indoor spaces. Stay at least 6 feet (2 meters) away from others. ? Disinfect objects and surfaces that are frequently touched every day. This may include:  Counters and tables.  Doorknobs and light switches.  Sinks and faucets.  Electronics, such as phones, remote controls, keyboards, computers, and tablets. To protect others: If you have symptoms of COVID-19, take steps to prevent the virus from spreading to others.  If you think you have a COVID-19 infection, contact your health care  provider right away. Tell your health care team that you think you may have a COVID-19 infection.  Stay home. Leave your house only to seek medical care. Do not use public transport.  Do not travel while you are sick.  Wash your hands often with soap and water for 20 seconds. If soap and water are not available, use alcohol-based hand sanitizer.  Stay away from other members of your household. Let healthy household members care for children and pets, if possible. If you have to care for children or pets, wash your hands often and wear a mask. If possible, stay in your own room, separate from others. Use a different bathroom.  Make sure that all people in your household wash their hands well and often.  Cough or sneeze into a tissue or your sleeve or elbow. Do not cough or sneeze into your hand or into the air.  Wear a cloth face covering or face mask. Make sure your mask covers your nose and mouth. Where to find more information  Centers for Disease Control and Prevention: www.cdc.gov/coronavirus/2019-ncov/index.html  World Health Organization: www.who.int/health-topics/coronavirus Contact a health care provider if:  You live in or have traveled to an area where COVID-19 is a risk and you have symptoms of the infection.  You have had contact with someone who has COVID-19 and you have symptoms of the infection. Get help right away if:  You have trouble breathing.  You have pain or pressure in your chest.  You have confusion.  You have bluish lips and fingernails.  You have difficulty waking from sleep.  You have symptoms that get worse. These symptoms may represent a serious problem that is an emergency. Do not wait to see if the symptoms will go away. Get medical help right away. Call your local emergency services (911 in the [place] Do not drive yourself to the hospital. Let the emergency medical personnel know if you think you have COVID-19. Summary  COVID-19 is a  respiratory infection that is caused by a virus. It is also known as coronavirus disease or novel coronavirus. It can cause serious infections, such as pneumonia, acute respiratory distress syndrome, acute respiratory failure, or sepsis.  The virus that causes COVID-19 is contagious. This means that it can spread from person to person through droplets from breathing, speaking, singing, coughing, or sneezing.  You are more likely to develop a serious illness if you are 50 years of age or older, have a weak immune system, live in a nursing home, or have chronic disease.  There is no medicine to treat COVID-19. Your health care provider will talk with you about ways to treat your symptoms.    Take steps to protect yourself and others from infection. Wash your hands often and disinfect objects and surfaces that are frequently touched every day. Stay away from people who are sick and wear a mask if you are sick. This information is not intended to replace advice given to you by your health care provider. Make sure you discuss any questions you have with your health care provider. Document Revised: 09/18/2019 Document Reviewed: 12/25/2018 Elsevier Patient Education  2020 [employer]  

## 2019-12-10 NOTE — Discharge Summary (Signed)
[facility]  Physician Discharge Summary   Patient ID: Judy [name] MRN: [id number] DOB/AGE: [birth year] 79 y.o.  Admit date: 12/03/2019 Discharge date: 12/11/2019  PCP: Patient, No Pcp Per  DISCHARGE DIAGNOSES:  Acute respiratory failure with hypoxia, resolved Pneumonia due to COVID-19 Chronic kidney disease stage IIIa Diabetes mellitus type 2 uncontrolled with hyperglycemia Essential hypertension History of anxiety and depression  RECOMMENDATIONS FOR OUTPATIENT FOLLOW UP: 1. Home health    Home Health: PT and OT Equipment/Devices: 3n 1  CODE STATUS: Full code  DISCHARGE CONDITION: fair  Diet recommendation: Modified carbohydrate  INITIAL HISTORY: 79 y.o. female past medical history significant for diabetes mellitus type 2, essential hypertension chronic pain presents to the ED with 3 days of shortness of breath the patient reports that she was diagnosed with COVID-19 days ago during this time she been having menorrhagia anorexia with worsening over the next 6 days, and about 4 days prior to admission she started developing shortness of breath fever chills in the ED she was noted to be hypoxic in the 80s placed on 5 L and improved to 91%.  She was also found to be hyponatremic hyperkalemic, SARS-CoV-2 PCR was positive chest x-ray showed right-sided infiltrates   HOSPITAL COURSE:   Acute respiratory failure with hypoxia secondarily to pneumonia due to COVID-19: Patient was hospitalized and placed on steroids and remdesivir.  She also received Actemra.  She started improving.  She is weaned down to room air.  She did feel anxious and nervous during this hospitalization.  Patient was reassured.  Her inflammatory markers improved.  Seen by PT and OT.  Home health will be ordered.  Son also lives close by and can provide assistance as well.  Chronic kidney disease stage IIIa/hyperkalemia With a baseline creatinine 1.2-1.5.  Renal function has been stable.   Hyperkalemia improved with Kayexalate.   Hypovolemic hyponatremia: In the setting of decreased oral intake and diuretic use. Her sodium improved with IV fluid hydration.  Diabetes mellitus type 2 uncontrolled with hyperglycemia/chronic kidney disease stage IIIa: Resume Metformin. HbA1c was 8.1.   Essential HTN (hypertension) Continue home medication  Chronic pain/anxiety/depression: Continue home medications  Obesity Estimated body mass index is 35 kg/m as calculated from the following:   Height as of this encounter: 5' (1.524 m).   Weight as of this encounter: 81.3 kg.  Overall stable.  Okay for discharge home today.   PERTINENT LABS:  The results of significant diagnostics from this hospitalization (including imaging, microbiology, ancillary and laboratory) are listed below for reference.    Microbiology: Recent Results (from the past 240 hour(s))  Blood Culture (routine x 2)     Status: None   Collection Time: 12/03/19  2:50 PM   Specimen: BLOOD  Result Value Ref Range Status   Specimen Description   Final    BLOOD RIGHT ANTECUBITAL Performed at [facility] [address], [place], [place] [address]    Special Requests   Final    BOTTLES DRAWN AEROBIC AND ANAEROBIC Blood Culture adequate volume Performed at [facility] [address], [place], [place] [address]    Culture   Final    NO GROWTH 5 DAYS Performed at [facility], [place] [address] [place], [place] [address]    Report Status 12/08/2019 FINAL  Final  Blood Culture (routine x 2)     Status: None   Collection Time: 12/03/19  4:20 PM   Specimen: BLOOD  Result Value Ref Range  Status   Specimen Description   Final    BLOOD LEFT ANTECUBITAL Performed at [facility] [address], [place], [place] [address]    Special Requests   Final    BOTTLES DRAWN AEROBIC AND ANAEROBIC Blood Culture results may not be optimal due to an  inadequate volume of blood received in culture bottles Performed at [facility] [address], [place], [place] [address]    Culture   Final    NO GROWTH 5 DAYS Performed at [facility], [place] [address] [place],  [address]    Report Status 12/08/2019 FINAL  Final     Labs:  COVID-19 Labs  Recent Labs    12/09/19 0135  DDIMER 1.03*  CRP 0.9    Lab Results  Component Value Date   SARSCOV2NAA Not Detected 10/20/2019      Basic Metabolic Panel: Recent Labs  Lab 12/05/19 1455 12/06/19 0130 12/07/19 0623 12/08/19 0620 12/10/19 0335  NA 132* 134* 134* 135 132*  K 4.6 5.2* 4.4 4.8 4.9  CL 102 103 98 99 93*  CO2 18* 21* 24 24 28   GLUCOSE 325* 255* 112* 63* 68*  BUN 34* 34* 32* 32* 37*  CREATININE 1.13* 1.17* 1.01* 1.00 1.07*  CALCIUM 8.7* 9.3 9.2 9.5 9.3   Liver Function Tests: Recent Labs  Lab 12/05/19 0423 12/06/19 0130 12/07/19 0623 12/08/19 0620  AST 25 23 23  37  ALT 24 25 28  40  ALKPHOS 64 84 65 61  BILITOT 0.3 0.2* 0.7 0.7  PROT 5.4* 6.0* 5.9* 5.9*  ALBUMIN 2.4* 2.7* 2.8* 2.9*   CBC: Recent Labs  Lab 12/05/19 0423 12/06/19 0130 12/07/19 0623 12/08/19 0620 12/10/19 0335  WBC 15.4* 19.1* 18.7* 20.4* 14.0*  NEUTROABS 12.6* 15.3* 14.5* 15.4*  --   HGB 12.1 12.3 12.5 13.1 14.2  HCT 36.2 38.1 37.5 39.4 42.9  MCV 89.6 91.1 88.7 88.9 92.3  PLT 334 402* 444* 425* 506*    CBG: Recent Labs  Lab 12/09/19 2133 12/10/19 0750 12/10/19 0945 12/10/19 1148 12/10/19 1602  GLUCAP 83 66* 133* 90 212*     IMAGING STUDIES CT HEAD WO CONTRAST  Result Date: 12/09/2019 CLINICAL DATA:  Encephalopathy EXAM: CT HEAD WITHOUT CONTRAST TECHNIQUE: Contiguous axial images were obtained from the base of the skull through the vertex without intravenous contrast. COMPARISON:  None. FINDINGS: Brain: Mild atrophic changes and chronic white matter ischemic changes are seen. No findings to suggest acute hemorrhage, acute infarction or  space-occupying mass lesion noted. Vascular: No hyperdense vessel or unexpected calcification. Skull: Normal. Negative for fracture or focal lesion. Sinuses/Orbits: No acute finding. Other: None. IMPRESSION: Chronic atrophic and ischemic changes.  No acute abnormality noted. Electronically Signed   By: Inez Catalina M.D.   On: 12/09/2019 21:10   DG CHEST PORT 1 VIEW  Result Date: 12/06/2019 CLINICAL DATA:  Dyspnea EXAM: PORTABLE CHEST 1 VIEW COMPARISON:  12/03/2019 FINDINGS: Heart size is enlarged, stable. Improving aeration of the bilateral lungs compared to prior with resolving bilateral airspace opacities. Prominent interstitial markings bilaterally versus, although improved. No pleural effusion or pneumothorax. IMPRESSION: Improving aeration of the lungs with resolving bilateral airspace opacities. Electronically Signed   By: Davina Poke D.O.   On: 12/06/2019 12:38   DG Chest Port 1 View  Result Date: 12/03/2019 CLINICAL DATA:  Transported by [facility] from urgent care-- tested positive for COVID 9 days ago, seen at [facility] for exertional shob, weakness, fatigue. Room air sats  read 88% and with 4 L of oxygen--94%. AAO x 4. From home. HTN EXAM: PORTABLE CHEST 1 VIEW COMPARISON:  Chest radiograph 11/04/2017 FINDINGS: Limited exam due to AP portable technique. Stable cardiomediastinal contours. There are opacities in the right upper and likely right lower lobe suspicious for infection. No definite infiltrate in the left lung. No pneumothorax or large pleural effusion. No acute finding in the visualized skeleton. IMPRESSION: Consolidative opacity within the right upper and likely right lower lung suspicious for infection. Electronically Signed   By: Audie Pinto M.D.   On: 12/03/2019 15:48    DISCHARGE EXAMINATION: Vitals:   12/10/19 0317 12/10/19 0745 12/10/19 0954 12/10/19 1623  BP: 113/69 (!) 102/56 123/69 (!) 101/51  Pulse: 78 (!) 53  96  Resp: 12 16  16   Temp: 97.7 F (36.5 C) 98 F (36.7 C)   98 F (36.7 C)  TempSrc: Axillary Oral  Oral  SpO2: 96% 97%  97%  Weight:      Height:       General appearance: Awake alert.  In no distress Resp: Clear to auscultation bilaterally.  Normal effort Cardio: S1-S2 is normal regular.  No S3-S4.  No rubs murmurs or bruit GI: Abdomen is soft.  Nontender nondistended.  Bowel sounds are present normal.  No masses organomegaly Extremities: No edema.  Full range of motion of lower extremities. Neurologic: Alert and oriented x3.  No focal neurological deficits.    DISPOSITION: Home with home health  Discharge Instructions    Discharge instructions   Complete by: As directed    Please take your medications as instructed.  Follow-up with your primary care provider.  Seek attention if you develop new symptoms.  Eat a late night snack before going to bed to avoid low blood glucose levels in the morning.  COVID 19 INSTRUCTIONS  - You are felt to be stable enough to no longer require inpatient monitoring, testing, and treatment, though you will need to follow the recommendations below: - Based on the CDC's non-test criteria for ending self-isolation: You may not return to work/leave the home until at least 21 days since symptom onset AND 3 days without a fever (without taking tylenol, ibuprofen, etc.) AND have improvement in respiratory symptoms. - Do not take NSAID medications (including, but not limited to, ibuprofen, advil, motrin, naproxen, aleve, goody's powder, etc.) - Follow up with your doctor in the next week via telehealth or seek medical attention right away if your symptoms get WORSE.  - Consider donating plasma after you have recovered (either 14 days after a negative test or 28 days after symptoms have completely resolved) because your antibodies to this virus may be helpful to give to others with life-threatening infections. Please go to the website [email or web address] if you would like to consider volunteering for plasma donation.     Directions for you at home:  Wear a facemask You should wear a facemask that covers your nose and mouth when you are in the same room with other people and when you visit a healthcare provider. People who live with or visit you should also wear a facemask while they are in the same room with you.  Separate yourself from other people in your home As much as possible, you should stay in a different room from other people in your home. Also, you should use a separate bathroom, if available.  Avoid sharing household items You should not share dishes, drinking glasses, cups, eating utensils, towels, bedding, or other items  with other people in your home. After using these items, you should wash them thoroughly with soap and water.  Cover your coughs and sneezes Cover your mouth and nose with a tissue when you cough or sneeze, or you can cough or sneeze into your sleeve. Throw used tissues in a lined trash can, and immediately wash your hands with soap and water for at least 20 seconds or use an alcohol-based hand rub.  Wash your [facility] your hands often and thoroughly with soap and water for at least 20 seconds. You can use an alcohol-based hand sanitizer if soap and water are not available and if your hands are not visibly dirty. Avoid touching your eyes, nose, and mouth with unwashed hands.  Directions for those who live with, or provide care at home for you:  Limit the number of people who have contact with the patient If possible, have only one caregiver for the patient. Other household members should stay in another home or place of residence. If this is not possible, they should stay in another room, or be separated from the patient as much as possible. Use a separate bathroom, if available. Restrict visitors who do not have an essential need to be in the home.  Ensure good ventilation Make sure that shared spaces in the home have good air flow, such as from an air  conditioner or an opened window, weather permitting.  Wash your hands often Wash your hands often and thoroughly with soap and water for at least 20 seconds. You can use an alcohol based hand sanitizer if soap and water are not available and if your hands are not visibly dirty. Avoid touching your eyes, nose, and mouth with unwashed hands. Use disposable paper towels to dry your hands. If not available, use dedicated cloth towels and replace them when they become wet.  Wear a facemask and gloves Wear a disposable facemask at all times in the room and gloves when you touch or have contact with the patient's blood, body fluids, and/or secretions or excretions, such as sweat, saliva, sputum, nasal mucus, vomit, urine, or feces.  Ensure the mask fits over your nose and mouth tightly, and do not touch it during use. Throw out disposable facemasks and gloves after using them. Do not reuse. Wash your hands immediately after removing your facemask and gloves. If your personal clothing becomes contaminated, carefully remove clothing and launder. Wash your hands after handling contaminated clothing. Place all used disposable facemasks, gloves, and other waste in a lined container before disposing them with other household waste. Remove gloves and wash your hands immediately after handling these items.  Do not share dishes, glasses, or other household items with the patient Avoid sharing household items. You should not share dishes, drinking glasses, cups, eating utensils, towels, bedding, or other items with a patient who is confirmed to have, or being evaluated for, COVID-19 infection. After the person uses these items, you should wash them thoroughly with soap and water.  Wash laundry thoroughly Immediately remove and wash clothes or bedding that have blood, body fluids, and/or secretions or excretions, such as sweat, saliva, sputum, nasal mucus, vomit, urine, or feces, on them. Wear gloves when  handling laundry from the patient. Read and follow directions on labels of laundry or clothing items and detergent. In general, wash and dry with the warmest temperatures recommended on the label.  Clean all areas the individual has used often Clean all touchable surfaces, such as counters, tabletops, doorknobs, bathroom  fixtures, toilets, phones, keyboards, tablets, and bedside tables, every day. Also, clean any surfaces that may have blood, body fluids, and/or secretions or excretions on them. Wear gloves when cleaning surfaces the patient has come in contact with. Use a diluted bleach solution (e.g., dilute bleach with 1 part bleach and 10 parts water) or a household disinfectant with a label that says EPA-registered for coronaviruses. To make a bleach solution at home, add 1 tablespoon of bleach to 1 quart (4 cups) of water. For a larger supply, add  cup of bleach to 1 gallon (16 cups) of water. Read labels of cleaning products and follow recommendations provided on product labels. Labels contain instructions for safe and effective use of the cleaning product including precautions you should take when applying the product, such as wearing gloves or eye protection and making sure you have good ventilation during use of the product. Remove gloves and wash hands immediately after cleaning.  Monitor yourself for signs and symptoms of illness Caregivers and household members are considered close contacts, should monitor their health, and will be asked to limit movement outside of the home to the extent possible. Follow the monitoring steps for close contacts listed on the symptom monitoring form.   If you have additional questions, contact your local health department or call the epidemiologist on call at [phone] (available 24/7). This guidance is subject to change. For the most up-to-date guidance from [facility], please refer to their  website: YouBlogs.pl     You were cared for by a hospitalist during your hospital stay. If you have any questions about your discharge medications or the care you received while you were in the hospital after you are discharged, you can call the unit and asked to speak with the hospitalist on call if the hospitalist that took care of you is not available. Once you are discharged, your primary care physician will handle any further medical issues. Please note that NO REFILLS for any discharge medications will be authorized once you are discharged, as it is imperative that you return to your primary care physician (or establish a relationship with a primary care physician if you do not have one) for your aftercare needs so that they can reassess your need for medications and monitor your lab values. If you do not have a primary care physician, you can call [phone] for a physician referral.   Increase activity slowly   Complete by: As directed         Allergies as of 12/10/2019   No Known Allergies     Medication List    TAKE these medications   accu-chek safe-t pro lancets Test twice daily   clonazePAM 0.5 MG tablet Commonly known as: KLONOPIN Take 0.5 mg by mouth at bedtime.   diltiazem 240 MG 24 hr capsule Commonly known as: CARDIZEM CD Take 240 mg by mouth daily.   etodolac 400 MG tablet Commonly known as: LODINE Take 400 mg by mouth daily as needed for moderate pain.   glucose blood test strip Commonly known as: Accu-Chek Aviva Test twice daily   HYDROcodone-acetaminophen 5-325 MG tablet Commonly known as: NORCO/VICODIN Take 1-2 tablets by mouth every 6 (six) hours as needed for moderate pain or severe pain.   imipramine 50 MG tablet Commonly known as: TOFRANIL Take 50 mg by mouth at bedtime.   metFORMIN 500 MG tablet Commonly known as: GLUCOPHAGE Take 1 tablet (500 mg total) by mouth 3 (three) times  daily. What changed:   how  much to take  when to take this  additional instructions   NON FORMULARY 1 each by Other route See admin instructions. CPAP machine nightly   spironolactone 50 MG tablet Commonly known as: ALDACTONE Take 50 mg by mouth daily.        Follow-up Information    [facility] Follow up.   Why: 3n1 arranged- to be delivered to room by [facility] prior to discharge       [place]. Call on 12/22/2019.   Why: f/u tele appointment- this will be a telephonic appointment- the clinic will call you prior to appointment with instructions for appointment- f/u will be with Dr. Asencion Noble at 9:30 telephonic Contact information: [place] [phone]       [facility] Follow up.   Specialty: [place] Why: HHPT/OT arranged- they will call you to set up home visits- plan for start of care by Jan. 11 Contact information: [place] [address] [place] [address] [phone]           TOTAL DISCHARGE TIME: 35 minutes  [facility] Pager on [email or web address]  12/11/2019, 4:04 PM

## 2019-12-10 NOTE — Evaluation (Signed)
Occupational Therapy Evaluation Patient Details Name: Judy [name] MRN: [id number] DOB: 1941/02/06 Today's Date: 12/10/2019    History of Present Illness 79 year old female admitted with COVID, and right sided infiltrates in lung, per chest x ray, hyponatremia, and hyperkalemia; PMH includes DM2, HTN, and chronic pain.   Clinical Impression   PTA Pt independent in ADL and mobility- working as a Quarry manager. Today Pt presents with decreased activity tolerance, balance, strength, and requiring more assist for ADL/transfers. Pt is min A for LB ADL, generally set up for UB ADL in seated position, fatigues quickly. Pt will require continued skilled OT in the acute setting as well as afterwards at the [facility] level. Special focus today on tub transfer with 3in1, grab bars, and RW - simulated in the room. Emphasized that someone should be with her as she gets in and out of the tub and that she should sit for safety and for energy conservation for bathing. Pt verbalized agreement. Energy conservation worksheet provided and reviewed. Pt did tell OT that she feels like she's "turned a corner" and is much more confident. Still recommend HHOT post-acute.     Follow Up Recommendations  Home health OT;Supervision/Assistance - 24 hour(initially)    Equipment Recommendations  3 in 1 bedside commode;Tub/shower seat    Recommendations for Other Services       Precautions / Restrictions Precautions Precautions: Fall Precaution Comments: h/o falls Restrictions Weight Bearing Restrictions: No      Mobility Bed Mobility Overal bed mobility: Modified Independent             General bed mobility comments: light use of rail  Transfers Overall transfer level: Needs assistance Equipment used: Rolling walker (2 wheeled) Transfers: Sit to/from Stand Sit to Stand: Supervision         General transfer comment: spervision for safety    Balance Overall balance assessment: Needs assistance Sitting-balance  support: Feet supported;Bilateral upper extremity supported Sitting balance-Leahy Scale: Good     Standing balance support: Bilateral upper extremity supported Standing balance-Leahy Scale: Fair Standing balance comment: needs external support for dynamic                           ADL either performed or assessed with clinical judgement   ADL Overall ADL's : Needs assistance/impaired Eating/Feeding: Modified independent;Sitting   Grooming: Supervision/safety;Standing;Wash/dry hands   Upper Body Bathing: Min guard;Sitting   Lower Body Bathing: Min guard;Sitting/lateral leans Lower Body Bathing Details (indicate cue type and reason): reviewed importance of shower chair for safety and energy conservation Upper Body Dressing : Set up;Sitting   Lower Body Dressing: Min guard;Sitting/lateral leans   Toilet Transfer: Supervision/safety;Ambulation;RW   Toileting- Water quality scientist and Hygiene: Min guard;Sit to/from stand   Tub/ Shower Transfer: Min guard;Tub transfer;3 in 1;Grab bars;Rolling walker Tub/Shower Transfer Details (indicate cue type and reason): simulated in room with 3 in 1, RW, and use of wall - Pt is to have supervision and stand-by assist at discharge for safety Functional mobility during ADLs: Supervision/safety;Rolling walker General ADL Comments: decreased activity tolerance - educated on energy conservation     Vision Baseline Vision/History: Wears glasses Wears Glasses: At all times Patient Visual Report: No change from baseline       Perception     Praxis      Pertinent Vitals/Pain       Hand Dominance Right   Extremity/Trunk Assessment Upper Extremity Assessment Upper Extremity Assessment: Generalized weakness   Lower  Extremity Assessment Lower Extremity Assessment: Defer to PT evaluation       Communication Communication Communication: No difficulties   Cognition Arousal/Alertness: Awake/alert Behavior During Therapy:  Anxious Overall Cognitive Status: Impaired/Different from baseline Area of Impairment: Problem solving                             Problem Solving: Slow processing General Comments: Pt seems a bit slow to process, unsure of baseline vs "COVID fog"   General Comments       Exercises     Shoulder Instructions      Home Living Family/patient expects to be discharged to:: Private residence Living Arrangements: Other (Comment)(roommate) Available Help at Discharge: Family;Friend(s);Available 24 hours/day Type of Home: House Home Access: Level entry     Home Layout: One level     Bathroom Shower/Tub: Teacher, early years/pre: Handicapped height     Home Equipment: Hand held shower head   Additional Comments: 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, clinical Level of Independence: Independent        Comments: States that she uses no AD for ambulation        OT Problem List: Decreased strength;Decreased activity tolerance;Impaired balance (sitting and/or standing);Decreased safety awareness;Decreased knowledge of use of DME or AE;Cardiopulmonary status limiting activity      OT Treatment/Interventions: Self-care/ADL training;Therapeutic exercise;Energy conservation;DME and/or AE instruction;Therapeutic activities;Patient/family education;Balance training    OT Goals(Current goals can be found in the care plan section) Acute Rehab OT Goals Patient Stated Goal: to go home and continue to recover OT Goal Formulation: With patient Time For Goal Achievement: 12/24/19 Potential to Achieve Goals: Good ADL Goals Pt Will Perform Grooming: with modified independence;standing Pt Will Perform Upper Body Bathing: with set-up;sitting Pt Will Perform Lower Body Bathing: with set-up;sitting/lateral leans Pt Will Transfer to Toilet: with modified independence;ambulating Pt Will Perform Toileting - Clothing Manipulation and hygiene: with  modified independence;sit to/from stand Additional ADL Goal #1: Pt will recall 3 ways of conserving energy during ADL routine with one or less cue  OT Frequency: Min 2X/week   Barriers to D/C:            Co-evaluation              AM-PAC OT "6 Clicks" Daily Activity     Outcome Measure Help from another person eating meals?: None Help from another person taking care of personal grooming?: A Little Help from another person toileting, which includes using toliet, bedpan, or urinal?: A Little Help from another person bathing (including washing, rinsing, drying)?: A Little Help from another person to put on and taking off regular upper body clothing?: None Help from another person to put on and taking off regular lower body clothing?: A Little 6 Click Score: 20   End of Session Equipment Utilized During Treatment: Gait belt;Rolling walker Nurse Communication: Mobility status  Activity Tolerance: Patient tolerated treatment well Patient left: in bed;with call bell/phone within reach;Other (comment)(MD in room)  OT Visit Diagnosis: Unsteadiness on feet (R26.81);Muscle weakness (generalized) (M62.81);History of falling (Z91.81)                Time: 1017-1040 OT Time Calculation (min): 23 min Charges:  OT General Charges $OT Visit: 1 Visit OT Evaluation $OT Eval Moderate Complexity: 1 Mod OT Treatments $Self Care/Home Management : 8-22 mins  Jesse Sans OTR/L Acute Rehabilitation Services Pager: [phone] Office:  [place] 12/10/2019, 12:27 PM

## 2019-12-22 ENCOUNTER — Other Ambulatory Visit: Payer: Self-pay

## 2019-12-22 ENCOUNTER — Ambulatory Visit: Payer: Medicare Other | Attending: Critical Care Medicine | Admitting: Critical Care Medicine

## 2019-12-22 ENCOUNTER — Encounter: Payer: Self-pay | Admitting: Critical Care Medicine

## 2019-12-22 DIAGNOSIS — J1282 Pneumonia due to coronavirus disease 2019: Secondary | ICD-10-CM | POA: Diagnosis not present

## 2019-12-22 DIAGNOSIS — R5383 Other fatigue: Secondary | ICD-10-CM

## 2019-12-22 DIAGNOSIS — N179 Acute kidney failure, unspecified: Secondary | ICD-10-CM | POA: Diagnosis not present

## 2019-12-22 DIAGNOSIS — E1122 Type 2 diabetes mellitus with diabetic chronic kidney disease: Secondary | ICD-10-CM

## 2019-12-22 DIAGNOSIS — E559 Vitamin D deficiency, unspecified: Secondary | ICD-10-CM

## 2019-12-22 DIAGNOSIS — Z6833 Body mass index (BMI) 33.0-33.9, adult: Secondary | ICD-10-CM

## 2019-12-22 DIAGNOSIS — F419 Anxiety disorder, unspecified: Secondary | ICD-10-CM

## 2019-12-22 DIAGNOSIS — I10 Essential (primary) hypertension: Secondary | ICD-10-CM

## 2019-12-22 DIAGNOSIS — E6609 Other obesity due to excess calories: Secondary | ICD-10-CM

## 2019-12-22 DIAGNOSIS — U071 COVID-19: Secondary | ICD-10-CM | POA: Diagnosis not present

## 2019-12-22 DIAGNOSIS — J9601 Acute respiratory failure with hypoxia: Secondary | ICD-10-CM

## 2019-12-22 DIAGNOSIS — G4733 Obstructive sleep apnea (adult) (pediatric): Secondary | ICD-10-CM

## 2019-12-22 DIAGNOSIS — E538 Deficiency of other specified B group vitamins: Secondary | ICD-10-CM

## 2019-12-22 DIAGNOSIS — E119 Type 2 diabetes mellitus without complications: Secondary | ICD-10-CM

## 2019-12-22 DIAGNOSIS — N183 Chronic kidney disease, stage 3 unspecified: Secondary | ICD-10-CM

## 2019-12-22 MED ORDER — METFORMIN HCL 500 MG PO TABS: 500.0000 mg | ORAL_TABLET | Freq: Two times a day (BID) | ORAL | 2 refills | Status: DC

## 2019-12-22 NOTE — Assessment & Plan Note (Signed)
History of B12 deficiency we will follow-up lab data when she comes to the office

## 2019-12-22 NOTE — Assessment & Plan Note (Signed)
The patient does not have a pulse oximeter and the instrument see what her oxygenation is  Nevertheless what we will do at this point is follow her expectantly and see her for an in office exam on February 9 when she will be passed her isolation.  For coming back into the office post severe Covid infection

## 2019-12-22 NOTE — Assessment & Plan Note (Signed)
This patient did have sleep apnea study previously and is on CPAP and I have asked the patient to continue this nightly

## 2019-12-22 NOTE — Assessment & Plan Note (Signed)
Patient appears to have chronic fatigue at this time fatigue is likely basis of prior Covid pneumonia

## 2019-12-22 NOTE — Progress Notes (Signed)
Subjective:    Patient ID: Judy [name], female    DOB: 1941-11-23, 79 y.o.   MRN: [id number] Virtual Visit via Telephone Note  I connected with Judy [name] on 12/22/19 at  9:30 AM EST by telephone and verified that I am speaking with the correct person using two identifiers.   Consent:  I discussed the limitations, risks, security and privacy concerns of performing an evaluation and management service by telephone and the availability of in person appointments. I also discussed with the patient that there may be a patient responsible charge related to this service. The patient expressed understanding and agreed to proceed.  Location of patient: Patient was at home  Location of provider: I was in my office  Persons participating in the televisit with the patient.   No one else on the call    History of Present Illness: 79 y.o.F post hosp for covid.  No pcp.  Hx HTN, OSA, T2DM, Depression, anxiety, Lumbar stenosis, Vit D and B12 deficiency   This is a telephone visit and post hospital visit as well for this patient who was in the hospital for Covid pneumonia between December 31 and January 8.  The patient used to have Dr. Zada Girt is here primary care physician but he just retired at the end of last year.  She is not yet achieved a new primary care physician.  The patient was admitted with hypoxemia and Covid pneumonia.  Since discharge she still does not have any energy has some headaches feels that she has nasal congestion notes some shortness of breath with exertion but no cough.  She has muscle aches but no fever.  She does not have a home pulse oximeter.  Physical therapy came to see her and she is walking better without the cane.  She is not yet gone out of her home.  She can do some ADLs but not many because of her situation.  She does have type 2 diabetes and her blood sugars have been running in the 140 range on 500 mg daily of Metformin.  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  She is wanting a when she can go back to work.  She has home aide coming in giving her baths.  She does not have a method to measure her blood pressure.  The blood pressure was elevated during the hospitalization.  Also she had hyperkalemia during the hospitalization.  She is on Aldactone.  Below is the discharge summary  Admit date: 12/03/2019 Discharge date: 12/11/2019  PCP: Patient, No Pcp Per  DISCHARGE DIAGNOSES:  Acute respiratory failure with hypoxia, resolved Pneumonia due to COVID-19 Chronic kidney disease stage IIIa Diabetes mellitus type 2 uncontrolled with hyperglycemia Essential hypertension History of anxiety and depression  RECOMMENDATIONS FOR OUTPATIENT FOLLOW UP: 1. Home health    Home Health: PT and OT Equipment/Devices: 3n 1  CODE STATUS: Full code  DISCHARGE CONDITION: fair  Diet recommendation: Modified carbohydrate  INITIAL HISTORY: 79 y.o.femalepast medical history significant for diabetes mellitus type 2, essential hypertension chronic pain presents to the ED with 3 days of shortness of breath the patient reports that she was diagnosed with COVID-19 days ago during this time she been having menorrhagia anorexia with worsening over the next 6 days, and about 4 days prior to admission she started developing shortness of breath fever chills in the ED she was noted to be  hypoxic in the 80s placed on 5 L and improved to 91%. She was also found to be hyponatremic hyperkalemic, SARS-CoV-2 PCR was positive chest x-ray showed right-sided infiltrates   HOSPITAL COURSE:   Acute respiratory failure with hypoxia secondarily to pneumonia due to COVID-19: Patient was hospitalized and placed on steroids and remdesivir.  She also received Actemra.  She started improving.  She is weaned down to room air.  She did feel anxious and nervous during this hospitalization.  Patient  was reassured.  Her inflammatory markers improved.  Seen by PT and OT.  Home health will be ordered.  Son also lives close by and can provide assistance as well.  Chronic kidney disease stage IIIa/hyperkalemia With a baseline creatinine 1.2-1.5.Renal function has been stable. Hyperkalemia improved with Kayexalate.   Hypovolemic hyponatremia: In the setting of decreased oral intake and diuretic use. Her sodium improved with IV fluid hydration.  Diabetes mellitus type 2 uncontrolled with hyperglycemia/chronic kidney disease stage IIIa: Resume Metformin.HbA1c was 8.1.   Essential HTN (hypertension) Continue home medication  Chronic pain/anxiety/depression: Continue home medications  Obesity Estimated body mass index is 35 kg/m as calculated from the following: Height as of this encounter: 5' (1.524 m). Weight as of this encounter: 81.3 kg.   Note her hemoglobin A1c was 8.1 during the recent hospitalization and she also had hypokalemia and hyponatremia along with chronic kidney disease  Past Medical History:  Diagnosis Date  . Anxiety   . Back pain   . Complication of anesthesia 2003   gallbladder surgery-resp. arrest-stop operation and pt. went to ICU - patient stated she has back surgery since then and had no trouble at all  . Depression   . Diabetes ([place])   . Dyspnea   . Gout   . History of kidney stones   . Hypertension   . Leg edema   . Pancreatitis, chronic ([place]) 03/2016  . Sleep apnea      Family History  Problem Relation Age of Onset  . Dementia Mother   . AAA (abdominal aortic aneurysm) Father   . Breast cancer Maternal Aunt      Social History   Socioeconomic History  . Marital status: Divorced    Spouse name: Not on file  . Number of children: Not on file  . Years of education: Not on file  . Highest education level: Not on file  Occupational History  . Occupation: CNA  Tobacco Use  . Smoking status: Never Smoker  . Smokeless  tobacco: Never Used  Substance and Sexual Activity  . Alcohol use: No    Alcohol/week: 0.0 standard drinks  . Drug use: No  . Sexual activity: Not on file  Other Topics Concern  . Not on file  Social History Narrative  . Not on file   Social Determinants of Health   Financial Resource Strain:   . Difficulty of Paying Living Expenses: Not on file  Food Insecurity:   . Worried About Charity fundraiser in the Last Year: Not on file  . Ran Out of Food in the Last Year: Not on file  Transportation Needs:   . Lack of Transportation (Medical): Not on file  . Lack of Transportation (Non-Medical): Not on file  Physical Activity:   . Days of Exercise per Week: Not on file  . Minutes of Exercise per Session: Not on file  Stress:   . Feeling of Stress : Not on file  Social Connections:   . Frequency of  Communication with Friends and Family: Not on file  . Frequency of Social Gatherings with Friends and Family: Not on file  . Attends Religious Services: Not on file  . Active Member of Clubs or Organizations: Not on file  . Attends Archivist Meetings: Not on file  . Marital Status: Not on file  Intimate Partner Violence:   . Fear of Current or Ex-Partner: Not on file  . Emotionally Abused: Not on file  . Physically Abused: Not on file  . Sexually Abused: Not on file     No Known Allergies   Outpatient Medications Prior to Visit  Medication Sig Dispense Refill  . clonazePAM (KLONOPIN) 0.5 MG tablet Take 0.5 mg by mouth at bedtime.    [name] diltiazem (CARDIZEM CD) 240 MG 24 hr capsule Take 240 mg by mouth daily.    [name] glucose blood (ACCU-CHEK AVIVA) test strip Test twice daily 60 each 0  . imipramine (TOFRANIL) 50 MG tablet Take 50 mg by mouth at bedtime.    . Lancets (ACCU-CHEK SAFE-T PRO) lancets Test twice daily 100 each 0  . NON FORMULARY 1 each by Other route See admin instructions. CPAP machine nightly    . spironolactone (ALDACTONE) 50 MG tablet Take 50 mg by mouth  daily. HOLD FOR NOW    . metFORMIN (GLUCOPHAGE) 500 MG tablet Take 1 tablet (500 mg total) by mouth 3 (three) times daily. (Patient taking differently: Take 500 mg by mouth daily. ) 90 tablet 0  . etodolac (LODINE) 400 MG tablet Take 400 mg by mouth daily as needed for moderate pain.    [name] HYDROcodone-acetaminophen (NORCO/VICODIN) 5-325 MG tablet Take 1-2 tablets by mouth every 6 (six) hours as needed for moderate pain or severe pain. (Patient not taking: Reported on 12/22/2019) 10 tablet 0   No facility-administered medications prior to visit.      Review of Systems Constitutional:     weight loss, night sweats,  Fevers, chills, fatigue, lassitude. HEENT:    headaches,  Difficulty swallowing,  Tooth/dental problems,  Sore throat,                No sneezing, itching, ear ache, nasal congestion, post nasal drip,   CV:  No chest pain,  Orthopnea, PND, swelling in lower extremities, anasarca, dizziness, palpitations  GI  No heartburn, indigestion, abdominal pain, nausea, vomiting, diarrhea, change in bowel habits, loss of appetite  Resp:  shortness of breath with exertion or at rest.  No excess mucus, no productive cough,  No non-productive cough,  No coughing up of blood.  No change in color of mucus.  No wheezing.  No chest wall deformity  Skin: no rash or lesions.  GU: no dysuria, change in color of urine, no urgency or frequency.  No flank pain.  MS:  No joint pain or swelling.  No decreased range of motion.  No back pain.  Muscle aches  Psych:  No change in mood or affect. No depression or anxiety.  No memory loss.     Objective:   Physical Exam  There were no vitals taken for this visit. This is a telephone visit there was no exam  All labs from recent hospitalization are reviewed BMP Latest Ref Rng & Units 12/10/2019 12/08/2019 12/07/2019  Glucose 70 - 99 mg/dL 68(L) 63(L) 112(H)  BUN 8 - 23 mg/dL 37(H) 32(H) 32(H)  Creatinine 0.44 - 1.00 mg/dL 1.07(H) 1.00 1.01(H)  BUN/Creat  Ratio 12 - 28 - - -  Sodium 135 - 145 mmol/L 132(L) 135 134(L)  Potassium 3.5 - 5.1 mmol/L 4.9 4.8 4.4  Chloride 98 - 111 mmol/L 93(L) 99 98  CO2 22 - 32 mmol/L _0 Calcium 8.9 - 10.3 mg/dL 9.3 9.5 9.2   Hepatic Function Latest Ref Rng & Units 12/08/2019 12/07/2019 12/06/2019  Total Protein 6.5 - 8.1 g/dL 5.9(L) 5.9(L) 6.0(L)  Albumin 3.5 - 5.0 g/dL 2.9(L) 2.8(L) 2.7(L)  AST 15 - 41 U/L 37 23 23  ALT 0 - 44 U/L 40 28 25  Alk Phosphatase 38 - 126 U/L 61 65 84  Total Bilirubin 0.3 - 1.2 mg/dL 0.7 0.7 0.2(L)   CBC Latest Ref Rng & Units 12/10/2019 12/08/2019 12/07/2019  WBC 4.0 - 10.5 K/uL 14.0(H) 20.4(H) 18.7(H)  Hemoglobin 12.0 - 15.0 g/dL 14.2 13.1 12.5  Hematocrit 36.0 - 46.0 % 42.9 39.4 37.5  Platelets 150 - 400 K/uL 506(H) 425(H) 444(H)       Assessment & Plan:  I personally reviewed all images and lab data in the [facility] system as well as any outside material available during this office visit and agree with the  radiology impressions.   HTN (hypertension) Hypertension, unable to determine how well controlled the patient is at this time.  I am concerned about the patient being on Aldactone with a history of hyperkalemia.  Plan will be to discontinue Aldactone for now and continue diltiazem at 240 mg daily  Acute respiratory failure with hypoxia (HCC) The patient does not have a pulse oximeter and the instrument see what her oxygenation is  Nevertheless what we will do at this point is follow her expectantly and see her for an in office exam on February 9 when she will be passed her isolation.  For coming back into the office post severe Covid infection  Pneumonia due to COVID-19 virus Severe pneumonia due to COVID-19 virus the patient is out of her isolation.  He may leave her home however she cannot come into our office till 30 days after her discharge  The patient was advised to begin vitamin D 5000 units daily zinc 50 mg daily and vitamin C 500 mg daily  She will continue her  rehab at home and we will see her in the office for an in office exam and follow-up labs February 9  Type 2 diabetes mellitus with diabetic chronic kidney disease ([place]) Type 2 diabetes with acute on chronic kidney disease  Renal function was stable at time of discharge therefore will wait till February 9 for follow-up labs and we will asked the patient increase Metformin to 500 mg twice daily and check her blood glucoses 3 times daily and bring these results with her when she comes to the office  AKI (acute kidney injury) ([place]) This was resolving during the hospitalization we will follow-up renal function when she comes to the office  B12 nutritional deficiency History of B12 deficiency we will follow-up lab data when she comes to the office  Class 2 severe obesity with serious comorbidity and body mass index (BMI) of 35.0 to 35.9 in adult, unspecified obesity type ([place]) We will give patient further diet instructions diet instructions including likely a visit with medical nutritionist  Other fatigue Patient appears to have chronic fatigue at this time fatigue is likely basis of prior Covid pneumonia  Vitamin D deficiency We will check vitamin D levels the patient comes to the office for now we will maintain 5000 units daily  Obstructive sleep apnea  This patient did have sleep apnea study previously and is on CPAP and I have asked the patient to continue this nightly   Paylin was seen today for hospitalization follow-up.  Diagnoses and all orders for this visit:  Pneumonia due to COVID-19 virus  Type 2 diabetes mellitus without complication, without long-term current use of insulin ([place]) -     metFORMIN (GLUCOPHAGE) 500 MG tablet; Take 1 tablet (500 mg total) by mouth 2 (two) times daily with a meal.  Essential hypertension  Type 2 diabetes mellitus with stage 3 chronic kidney disease, without long-term current use of insulin, unspecified whether stage 3a or 3b CKD ([place])  Acute  respiratory failure with hypoxia (HCC)  Vitamin D deficiency  Obstructive sleep apnea  AKI (acute kidney injury) ([place])  Anxiety  Class 1 obesity due to excess calories with serious comorbidity and body mass index (BMI) of 33.0 to 33.9 in adult  Class 2 severe obesity with serious comorbidity and body mass index (BMI) of 35.0 to 35.9 in adult, unspecified obesity type ([place])  B12 nutritional deficiency  Other fatigue   Follow Up Instructions: The patient knows a lab exam and follow-up face-to-face is scheduled for February 9   I discussed the assessment and treatment plan with the patient. The patient was provided an opportunity to ask questions and all were answered. The patient agreed with the plan and demonstrated an understanding of the instructions.   The patient was advised to call back or seek an in-person evaluation if the symptoms worsen or if the condition fails to improve as anticipated.  I provided 30 minutes of non-face-to-face time during this encounter  including  median intraservice time , review of notes, labs, imaging, medications  and explaining diagnosis and management to the patient .    Asencion Noble, MD

## 2019-12-22 NOTE — Assessment & Plan Note (Signed)
We will check vitamin D levels the patient comes to the office for now we will maintain 5000 units daily

## 2019-12-22 NOTE — Assessment & Plan Note (Signed)
Hypertension, unable to determine how well controlled the patient is at this time.  I am concerned about the patient being on Aldactone with a history of hyperkalemia.  Plan will be to discontinue Aldactone for now and continue diltiazem at 240 mg daily

## 2019-12-22 NOTE — Assessment & Plan Note (Signed)
Type 2 diabetes with acute on chronic kidney disease  Renal function was stable at time of discharge therefore will wait till February 9 for follow-up labs and we will asked the patient increase Metformin to 500 mg twice daily and check her blood glucoses 3 times daily and bring these results with her when she comes to the office

## 2019-12-22 NOTE — Assessment & Plan Note (Signed)
Severe pneumonia due to COVID-19 virus the patient is out of her isolation.  He may leave her home however she cannot come into our office till 30 days after her discharge  The patient was advised to begin vitamin D 5000 units daily zinc 50 mg daily and vitamin C 500 mg daily  She will continue her rehab at home and we will see her in the office for an in office exam and follow-up labs February 9

## 2019-12-22 NOTE — Assessment & Plan Note (Signed)
We will give patient further diet instructions diet instructions including likely a visit with medical nutritionist

## 2019-12-22 NOTE — Progress Notes (Signed)
Sneezing, stop up head.

## 2019-12-22 NOTE — Assessment & Plan Note (Signed)
This was resolving during the hospitalization we will follow-up renal function when she comes to the office

## 2019-12-25 ENCOUNTER — Telehealth: Payer: Self-pay | Admitting: General Practice

## 2019-12-25 DIAGNOSIS — E1122 Type 2 diabetes mellitus with diabetic chronic kidney disease: Secondary | ICD-10-CM

## 2019-12-25 MED ORDER — GLUCOSE BLOOD VI STRP: ORAL_STRIP | 0 refills | Status: DC

## 2019-12-25 MED ORDER — ACCU-CHEK SAFE-T PRO LANCETS MISC: 11 refills | Status: DC

## 2019-12-25 NOTE — Telephone Encounter (Signed)
Refills on DM supplies sent

## 2019-12-25 NOTE — Telephone Encounter (Signed)
Patient called and requested for dr Joya Gaskins could write her a whole new prescription for her diabetic supplies. Patient stated that the script for her current diabetic supplies were discontinued. Please fu at your earliest convenience.  Please send to [facility] on [address]

## 2019-12-30 ENCOUNTER — Other Ambulatory Visit: Payer: Self-pay

## 2020-01-11 NOTE — Progress Notes (Signed)
Subjective:    Patient ID: Judy [name], female    DOB: 06/19/1941, 79 y.o.   MRN: [id number] History of Present Illness: 79 y.o.F post hosp for covid.  No pcp.  Hx HTN, OSA, T2DM, Depression, anxiety, Lumbar stenosis, Vit D and B12 deficiency   This is a telephone visit and post hospital visit as well for this patient who was in the hospital for Covid pneumonia between December 31 and January 8.  The patient used to have Dr. Zada Girt is here primary care physician but he just retired at the end of last year.  She is not yet achieved a new primary care physician.  The patient was admitted with hypoxemia and Covid pneumonia.  Since discharge she still does not have any energy has some headaches feels that she has nasal congestion notes some shortness of breath with exertion but no cough.  She has muscle aches but no fever.  She does not have a home pulse oximeter.  Physical therapy came to see her and she is walking better without the cane.  She is not yet gone out of her home.  She can do some ADLs but not many because of her situation.  She does have type 2 diabetes and her blood sugars have been running in the 140 range on 500 mg daily of Metformin.  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  She is wanting a when she can go back to work.  She has home aide coming in giving her baths.  She does not have a method to measure her blood pressure.  The blood pressure was elevated during the hospitalization.  Also she had hyperkalemia during the hospitalization.  She is on Aldactone.  Below is the discharge summary  Admit date: 12/03/2019 Discharge date: 12/11/2019  PCP: Patient, No Pcp Per  DISCHARGE DIAGNOSES:  Acute respiratory failure with hypoxia, resolved Pneumonia due to COVID-19 Chronic kidney disease stage IIIa Diabetes mellitus type 2 uncontrolled with hyperglycemia Essential hypertension History of anxiety  and depression  RECOMMENDATIONS FOR OUTPATIENT FOLLOW UP: 1. Home health    Home Health: PT and OT Equipment/Devices: 3n 1  CODE STATUS: Full code  DISCHARGE CONDITION: fair  Diet recommendation: Modified carbohydrate  INITIAL HISTORY: 79 y.o.femalepast medical history significant for diabetes mellitus type 2, essential hypertension chronic pain presents to the ED with 3 days of shortness of breath the patient reports that she was diagnosed with COVID-19  days ago during this time she been having menorrhagia anorexia with worsening over the next 6 days, and about 4 days prior to admission she started developing shortness of breath fever chills in the ED she was noted to be hypoxic in the 80s placed on 5 L and improved to 91%. She was also found to be hyponatremic hyperkalemic, SARS-CoV-2 PCR was positive chest x-ray showed right-sided infiltrates   HOSPITAL COURSE:   Acute respiratory failure with hypoxia secondarily to pneumonia due to COVID-19: Patient was hospitalized and placed on steroids and remdesivir.  She also received Actemra.  She started improving.  She is weaned down to room air.  She did feel anxious and nervous during this hospitalization.  Patient was reassured.  Her inflammatory markers improved.  Seen by PT and OT.  Home health will be ordered.  Son also lives close by and can provide assistance as well.  Chronic kidney disease stage IIIa/hyperkalemia With a baseline creatinine  1.2-1.5.Renal function has been stable. Hyperkalemia improved with Kayexalate.   Hypovolemic hyponatremia: In the setting of decreased oral intake and diuretic use. Her sodium improved with IV fluid hydration.  Diabetes mellitus type 2 uncontrolled with hyperglycemia/chronic kidney disease stage IIIa: Resume Metformin.HbA1c was 8.1.   Essential HTN (hypertension) Continue home medication  Chronic pain/anxiety/depression: Continue home  medications  Obesity Estimated body mass index is 35 kg/m as calculated from the following: Height as of this encounter: 5' (1.524 m). Weight as of this encounter: 81.3 kg.   Note her hemoglobin A1c was 8.1 during the recent hospitalization and she also had hypokalemia and hyponatremia along with chronic kidney disease   2/9 This is a return visit that is face-to-face from this patient who was in the hospital end of December through 8 January for COVID-19 pneumonia.  The last visit was a telephone visit.  Today she comes into the office and has been weaned off oxygen but still remains quite short of breath with minimal exertion.  She had home physical therapy but that has been discontinued and she is actually fallen twice since that time.  She uses a walker but does not use it consistently.  She does have BiPAP for sleep apnea but only uses it at night.  She does not have a pulse oximeter at home.  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  She notes that her blood sugars tend to be fasting 207 to 150 and postprandial 1 60-1 80  This patient had been on Aldactone but we have held this because of hyperkalemia she does maintain diltiazem daily for blood pressure.  She her primary care provider recently retired and she is going to be seeking a new primary care provider in private practice I did tell her I continue to see her for her pulmonary status  She is on zinc vitamin D and vitamin C supplementation at this time This patient also needs to have her vitamin B12 and vitamin D levels checked   Past Medical History:  Diagnosis Date  . Anxiety   . Back pain   . Complication of anesthesia 2003   gallbladder surgery-resp. arrest-stop operation and pt. went to ICU - patient stated she has back surgery since then and had no trouble at all  . Depression   . Diabetes ([place])   . Dyspnea   . Gout   . History of kidney stones   .  Hypertension   . Leg edema   . Pancreatitis, chronic ([place]) 03/2016  . Pneumonia due to COVID-19 virus 12/03/2019  . Sleep apnea      Family History  Problem Relation Age of Onset  . Dementia Mother   . AAA (abdominal aortic aneurysm) Father   . Breast cancer Maternal Aunt      Social History   Socioeconomic History  . Marital status: Divorced    Spouse name: Not on file  . Number of children: Not on file  . Years of education: Not on file  . Highest education level: Not on file  Occupational History  . Occupation: CNA  Tobacco Use  . Smoking status: Never Smoker  . Smokeless tobacco: Never Used  Substance and Sexual Activity  . Alcohol use: No    Alcohol/week: 0.0 standard drinks  . Drug use: No  . Sexual activity: Not Currently  Other Topics Concern  . Not on file  Social History Narrative  . Not  on file   Social Determinants of Health   Financial Resource Strain:   . Difficulty of Paying Living Expenses: Not on file  Food Insecurity:   . Worried About Charity fundraiser in the Last Year: Not on file  . Ran Out of Food in the Last Year: Not on file  Transportation Needs:   . Lack of Transportation (Medical): Not on file  . Lack of Transportation (Non-Medical): Not on file  Physical Activity:   . Days of Exercise per Week: Not on file  . Minutes of Exercise per Session: Not on file  Stress:   . Feeling of Stress : Not on file  Social Connections:   . Frequency of Communication with Friends and Family: Not on file  . Frequency of Social Gatherings with Friends and Family: Not on file  . Attends Religious Services: Not on file  . Active Member of Clubs or Organizations: Not on file  . Attends Archivist Meetings: Not on file  . Marital Status: Not on file  Intimate Partner Violence:   . Fear of Current or Ex-Partner: Not on file  . Emotionally Abused: Not on file  . Physically Abused: Not on file  . Sexually Abused: Not on file     No Known  Allergies   Outpatient Medications Prior to Visit  Medication Sig Dispense Refill  . glucose blood (ACCU-CHEK AVIVA) test strip Test twice daily 60 each 0  . Lancets (ACCU-CHEK SAFE-T PRO) lancets Test twice daily 100 each 11  . NON FORMULARY 1 each by Other route See admin instructions. CPAP machine nightly    . clonazePAM (KLONOPIN) 0.5 MG tablet Take 0.5 mg by mouth at bedtime.    [name] diltiazem (CARDIZEM CD) 240 MG 24 hr capsule Take 240 mg by mouth daily.    [name] etodolac (LODINE) 400 MG tablet Take 400 mg by mouth daily as needed for moderate pain.    [name] imipramine (TOFRANIL) 50 MG tablet Take 50 mg by mouth at bedtime.    . metFORMIN (GLUCOPHAGE) 500 MG tablet Take 1 tablet (500 mg total) by mouth 2 (two) times daily with a meal. 60 tablet 2  . HYDROcodone-acetaminophen (NORCO/VICODIN) 5-325 MG tablet Take 1-2 tablets by mouth every 6 (six) hours as needed for moderate pain or severe pain. (Patient not taking: Reported on 12/22/2019) 10 tablet 0  . spironolactone (ALDACTONE) 50 MG tablet Take 50 mg by mouth daily. HOLD FOR NOW     No facility-administered medications prior to visit.      Review of Systems Constitutional:     weight loss, night sweats,  Fevers, chills, fatigue, lassitude. HEENT:    headaches,  Difficulty swallowing,  Tooth/dental problems,  Sore throat,                No sneezing, itching, ear ache, nasal congestion, post nasal drip,   CV:  No chest pain,  Orthopnea, PND, swelling in lower extremities, anasarca, dizziness, palpitations  GI  No heartburn, indigestion, abdominal pain, nausea, vomiting, diarrhea, change in bowel habits, loss of appetite  Resp:  shortness of breath with exertion or at rest.  No excess mucus, no productive cough,  No non-productive cough,  No coughing up of blood.  No change in color of mucus.  No wheezing.  No chest wall deformity  Skin: no rash or lesions.  GU: no dysuria, change in color of urine, no urgency or frequency.  No flank  pain.  MS:  No  joint pain or swelling.  No decreased range of motion.  No back pain.  Muscle aches  Psych:  No change in mood or affect. No depression or anxiety.  No memory loss.     Objective:   Physical Exam  Vitals:   01/12/20 1016  BP: 138/75  Pulse: 89  Resp: 18  Temp: (!) 97.3 F (36.3 C)  TempSrc: Oral  SpO2: 96%  Weight: 187 lb (84.8 kg)  Height: 5' (1.524 m)    Gen: Pleasant, obese , in no distress,  normal affect  ENT: No lesions,  mouth clear,  oropharynx clear, no postnasal drip  Neck: No JVD, no TMG, no carotid bruits  Lungs: No use of accessory muscles, no dullness to percussion, clear without rales or rhonchi  Cardiovascular: RRR, heart sounds normal, no murmur or gallops, no peripheral edema  Abdomen: soft and NT, no HSM,  BS normal  Musculoskeletal: No deformities, no cyanosis or clubbing  Neuro: alert, non focal  Skin: Warm, no lesions or rashes   All labs from recent hospitalization are reviewed BMP Latest Ref Rng & Units 12/10/2019 12/08/2019 12/07/2019  Glucose 70 - 99 mg/dL 68(L) 63(L) 112(H)  BUN 8 - 23 mg/dL 37(H) 32(H) 32(H)  Creatinine 0.44 - 1.00 mg/dL 1.07(H) 1.00 1.01(H)  BUN/Creat Ratio 12 - 28 - - -  Sodium 135 - 145 mmol/L 132(L) 135 134(L)  Potassium 3.5 - 5.1 mmol/L 4.9 4.8 4.4  Chloride 98 - 111 mmol/L 93(L) 99 98  CO2 22 - 32 mmol/L '28 24 24  '$ Calcium 8.9 - 10.3 mg/dL 9.3 9.5 9.2   Hepatic Function Latest Ref Rng & Units 12/08/2019 12/07/2019 12/06/2019  Total Protein 6.5 - 8.1 g/dL 5.9(L) 5.9(L) 6.0(L)  Albumin 3.5 - 5.0 g/dL 2.9(L) 2.8(L) 2.7(L)  AST 15 - 41 U/L 37 23 23  ALT 0 - 44 U/L 40 28 25  Alk Phosphatase 38 - 126 U/L 61 65 84  Total Bilirubin 0.3 - 1.2 mg/dL 0.7 0.7 0.2(L)   CBC Latest Ref Rng & Units 12/10/2019 12/08/2019 12/07/2019  WBC 4.0 - 10.5 K/uL 14.0(H) 20.4(H) 18.7(H)  Hemoglobin 12.0 - 15.0 g/dL 14.2 13.1 12.5  Hematocrit 36.0 - 46.0 % 42.9 39.4 37.5  Platelets 150 - 400 K/uL 506(H) 425(H) 444(H)   Ambulatory  pulse ox is normal at this visit    Assessment & Plan:  I personally reviewed all images and lab data in the [facility] system as well as any outside material available during this office visit and agree with the  radiology impressions.   HTN (hypertension) Hypertension under adequate control at this time  We will continue diltiazem at 240 mg daily  Acute respiratory failure with hypoxia (HCC) Acute respiratory failure with hypoxemia now resolved  Pneumonia due to COVID-19 virus COVID-19 pneumonia now resolved  Obstructive sleep apnea Obstructive sleep apnea have asked this patient to continue  Type 2 diabetes mellitus with diabetic chronic kidney disease ([place]) Chronic kidney disease we will follow-up basic metabolic panel associated type 2 diabetes with plus minus control will increase Metformin to 1000 mg twice daily and begin Jardiance 10 mg daily  Anxiety Chronic anxiety disorder on chronic Klonopin 0.5 mg at bedtime  I checked the [facility] drug database and this has been filled regularly every month by Dr. Nyoka Cowden I will go ahead and give 30 of 0.5 mg Klonopin  B12 nutritional deficiency History of B12 nutritional deficiencies we will follow-up vitamin B12 levels  Vitamin D deficiency Vitamin  D deficiency and the patient is currently on 5000 units vitamin D daily  We will check vitamin D levels   Judy [name] was seen today for follow-up.  Diagnoses and all orders for this visit:  Pneumonia due to COVID-19 virus -     CT Angio Chest W/Cm &/Or Wo Cm; Future -     VAS [name] LOWER EXTREMITY VENOUS (DVT); Future -     Ambulatory referral to Physical Therapy  Type 2 diabetes mellitus without complication, without long-term current use of insulin (HCC) -     metFORMIN (GLUCOPHAGE) 1000 MG tablet; Take 1 tablet (1,000 mg total) by mouth 2 (two) times daily with a meal. -     Microalbumin, urine -     CBC with Differential/Platelet; Future -     CBC with Differential/Platelet  Type  2 diabetes mellitus with stage 3 chronic kidney disease, without long-term current use of insulin, unspecified whether stage 3a or 3b CKD (HCC) -     Glucose (CBG) -     Comprehensive metabolic panel  Type 2 diabetes mellitus with stage 3 chronic kidney disease, without long-term current use of insulin (HCC)  Pain in both lower extremities -     VAS [name] LOWER EXTREMITY VENOUS (DVT); Future -     Cane adjustable wide base quad  B12 nutritional deficiency -     Vitamin B12  Vitamin D deficiency -     VITAMIN D 25 Hydroxy (Vit-D Deficiency, Fractures)  Frequent falls -     Ambulatory referral to Physical Therapy -     Cane adjustable wide base quad  Essential hypertension  AKI (acute kidney injury) (HCC)  Anxiety  Obstructive sleep apnea  Dyspnea on exertion  Acute respiratory failure with hypoxia (HCC)  Other orders -     imipramine (TOFRANIL) 50 MG tablet; Take 1 tablet (50 mg total) by mouth at bedtime. -     clonazePAM (KLONOPIN) 0.5 MG tablet; Take 1 tablet (0.5 mg total) by mouth at bedtime. -     diltiazem (CARDIZEM CD) 240 MG 24 hr capsule; Take 1 capsule (240 mg total) by mouth daily. -     etodolac (LODINE) 400 MG tablet; Take 1 tablet (400 mg total) by mouth daily as needed for moderate pain. -     empagliflozin (JARDIANCE) 10 MG TABS tablet; Take 10 mg by mouth daily. -     furosemide (LASIX) 20 MG tablet; Take 1 tablet (20 mg total) by mouth daily as needed for edema.  Refills on the patient's maintenance medicines were made at this visit as well  Urine microalbumin will be checked

## 2020-01-12 ENCOUNTER — Encounter: Payer: Self-pay | Admitting: Critical Care Medicine

## 2020-01-12 ENCOUNTER — Ambulatory Visit: Payer: Medicare Other | Attending: Critical Care Medicine | Admitting: Critical Care Medicine

## 2020-01-12 ENCOUNTER — Other Ambulatory Visit: Payer: Self-pay

## 2020-01-12 VITALS — BP 138/75 | HR 89 | Temp 97.3°F | Resp 18 | Ht 60.0 in | Wt 187.0 lb

## 2020-01-12 DIAGNOSIS — U071 COVID-19: Secondary | ICD-10-CM

## 2020-01-12 DIAGNOSIS — N179 Acute kidney failure, unspecified: Secondary | ICD-10-CM

## 2020-01-12 DIAGNOSIS — R0600 Dyspnea, unspecified: Secondary | ICD-10-CM | POA: Diagnosis not present

## 2020-01-12 DIAGNOSIS — M79605 Pain in left leg: Secondary | ICD-10-CM

## 2020-01-12 DIAGNOSIS — I10 Essential (primary) hypertension: Secondary | ICD-10-CM

## 2020-01-12 DIAGNOSIS — F419 Anxiety disorder, unspecified: Secondary | ICD-10-CM

## 2020-01-12 DIAGNOSIS — N183 Chronic kidney disease, stage 3 unspecified: Secondary | ICD-10-CM

## 2020-01-12 DIAGNOSIS — E559 Vitamin D deficiency, unspecified: Secondary | ICD-10-CM

## 2020-01-12 DIAGNOSIS — J1282 Pneumonia due to coronavirus disease 2019: Secondary | ICD-10-CM

## 2020-01-12 DIAGNOSIS — R0609 Other forms of dyspnea: Secondary | ICD-10-CM

## 2020-01-12 DIAGNOSIS — G4733 Obstructive sleep apnea (adult) (pediatric): Secondary | ICD-10-CM

## 2020-01-12 DIAGNOSIS — E1122 Type 2 diabetes mellitus with diabetic chronic kidney disease: Secondary | ICD-10-CM

## 2020-01-12 DIAGNOSIS — E119 Type 2 diabetes mellitus without complications: Secondary | ICD-10-CM | POA: Insufficient documentation

## 2020-01-12 DIAGNOSIS — E1165 Type 2 diabetes mellitus with hyperglycemia: Secondary | ICD-10-CM | POA: Diagnosis not present

## 2020-01-12 DIAGNOSIS — R296 Repeated falls: Secondary | ICD-10-CM

## 2020-01-12 DIAGNOSIS — E538 Deficiency of other specified B group vitamins: Secondary | ICD-10-CM

## 2020-01-12 DIAGNOSIS — J9601 Acute respiratory failure with hypoxia: Secondary | ICD-10-CM | POA: Diagnosis not present

## 2020-01-12 DIAGNOSIS — M79604 Pain in right leg: Secondary | ICD-10-CM

## 2020-01-12 LAB — GLUCOSE, POCT (MANUAL RESULT ENTRY): POC Glucose: 168 mg/dl — AB (ref 70–99)

## 2020-01-12 MED ORDER — FUROSEMIDE 20 MG PO TABS: 20.0000 mg | ORAL_TABLET | Freq: Every day | ORAL | 4 refills | Status: DC | PRN

## 2020-01-12 MED ORDER — IMIPRAMINE HCL 50 MG PO TABS: 50.0000 mg | ORAL_TABLET | Freq: Every day | ORAL | 4 refills | Status: DC

## 2020-01-12 MED ORDER — DILTIAZEM HCL ER COATED BEADS 240 MG PO CP24: 240.0000 mg | ORAL_CAPSULE | Freq: Every day | ORAL | 4 refills | Status: DC

## 2020-01-12 MED ORDER — JARDIANCE 10 MG PO TABS: 10.0000 mg | ORAL_TABLET | Freq: Every day | ORAL | 4 refills | Status: DC

## 2020-01-12 MED ORDER — METFORMIN HCL 1000 MG PO TABS: 1000.0000 mg | ORAL_TABLET | Freq: Two times a day (BID) | ORAL | 4 refills | Status: DC

## 2020-01-12 MED ORDER — CLONAZEPAM 0.5 MG PO TABS: 0.5000 mg | ORAL_TABLET | Freq: Every day | ORAL | 0 refills | Status: DC

## 2020-01-12 MED ORDER — ETODOLAC 400 MG PO TABS: 400.0000 mg | ORAL_TABLET | Freq: Every day | ORAL | 0 refills | Status: DC | PRN

## 2020-01-12 NOTE — Assessment & Plan Note (Signed)
Chronic anxiety disorder on chronic Klonopin 0.5 mg at bedtime  I checked the [facility] drug database and this has been filled regularly every month by Dr. Nyoka Cowden I will go ahead and give 30 of 0.5 mg Klonopin

## 2020-01-12 NOTE — Assessment & Plan Note (Signed)
As per chronic kidney disease assessment

## 2020-01-12 NOTE — Assessment & Plan Note (Signed)
Obstructive sleep apnea have asked this patient to continue

## 2020-01-12 NOTE — Assessment & Plan Note (Signed)
COVID-19 pneumonia now resolved

## 2020-01-12 NOTE — Assessment & Plan Note (Signed)
Acute respiratory failure with hypoxemia now resolved

## 2020-01-12 NOTE — Assessment & Plan Note (Signed)
History of B12 nutritional deficiencies we will follow-up vitamin B12 levels

## 2020-01-12 NOTE — Assessment & Plan Note (Signed)
Vitamin D deficiency and the patient is currently on 5000 units vitamin D daily  We will check vitamin D levels

## 2020-01-12 NOTE — Patient Instructions (Addendum)
Labs today will include metabolic panel blood count urine for microalbumin and vitamin D and B12 levels will be checked  Begin furosemide 20 mg he can take that daily as needed for swelling in the lower extremities  Increase Metformin to 1000 mg twice daily  Begin Jardiance 1 daily for your diabetes  Refills on you other medications were sent to your pharmacy  A physical therapy consult was sent in  We will be getting a CT scan of your chest to look for blood clots and effects of the Covid virus and we will also be getting a vascular ultrasound of your lower extremities to look for blood clots in the legs  Please continue to push fluids at least 4 12 ounce bottles of water daily  Follow diabetic diet as outlined below  Please use your walker at all times until you are evaluated by physical therapy  Use your BiPAP machine when you are taking a rest during the day as well as at night  Return to see Dr. Joya Gaskins 1 month   Diabetes Mellitus and Nutrition, Adult When you have diabetes (diabetes mellitus), it is very important to have healthy eating habits because your blood sugar (glucose) levels are greatly affected by what you eat and drink. Eating healthy foods in the appropriate amounts, at about the same times every day, can help you:  Control your blood glucose.  Lower your risk of heart disease.  Improve your blood pressure.  Reach or maintain a healthy weight. Every person with diabetes is different, and each person has different needs for a meal plan. Your health care provider may recommend that you work with a diet and nutrition specialist (dietitian) to make a meal plan that is best for you. Your meal plan may vary depending on factors such as:  The calories you need.  The medicines you take.  Your weight.  Your blood glucose, blood pressure, and cholesterol levels.  Your activity level.  Other health conditions you have, such as heart or kidney disease. How do  carbohydrates affect me? Carbohydrates, also called carbs, affect your blood glucose level more than any other type of food. Eating carbs naturally raises the amount of glucose in your blood. Carb counting is a method for keeping track of how many carbs you eat. Counting carbs is important to keep your blood glucose at a healthy level, especially if you use insulin or take certain oral diabetes medicines. It is important to know how many carbs you can safely have in each meal. This is different for every person. Your dietitian can help you calculate how many carbs you should have at each meal and for each snack. Foods that contain carbs include:  Bread, cereal, rice, pasta, and crackers.  Potatoes and corn.  Peas, beans, and lentils.  Milk and yogurt.  Fruit and juice.  Desserts, such as cakes, cookies, ice cream, and candy. How does alcohol affect me? Alcohol can cause a sudden decrease in blood glucose (hypoglycemia), especially if you use insulin or take certain oral diabetes medicines. Hypoglycemia can be a life-threatening condition. Symptoms of hypoglycemia (sleepiness, dizziness, and confusion) are similar to symptoms of having too much alcohol. If your health care provider says that alcohol is safe for you, follow these guidelines:  Limit alcohol intake to no more than 1 drink per day for nonpregnant women and 2 drinks per day for men. One drink equals 12 oz of beer, 5 oz of wine, or 1 oz of hard liquor.  Do not drink on an empty stomach.  Keep yourself hydrated with water, diet soda, or unsweetened iced tea.  Keep in mind that regular soda, juice, and other mixers may contain a lot of sugar and must be counted as carbs. What are tips for following this plan?  Reading food labels  Start by checking the serving size on the "Nutrition Facts" label of packaged foods and drinks. The amount of calories, carbs, fats, and other nutrients listed on the label is based on one serving of  the item. Many items contain more than one serving per package.  Check the total grams (g) of carbs in one serving. You can calculate the number of servings of carbs in one serving by dividing the total carbs by 15. For example, if a food has 30 g of total carbs, it would be equal to 2 servings of carbs.  Check the number of grams (g) of saturated and trans fats in one serving. Choose foods that have low or no amount of these fats.  Check the number of milligrams (mg) of salt (sodium) in one serving. Most people should limit total sodium intake to less than 2,300 mg per day.  Always check the nutrition information of foods labeled as "low-fat" or "nonfat". These foods may be higher in added sugar or refined carbs and should be avoided.  Talk to your dietitian to identify your daily goals for nutrients listed on the label. Shopping  Avoid buying canned, premade, or processed foods. These foods tend to be high in fat, sodium, and added sugar.  Shop around the outside edge of the grocery store. This includes fresh fruits and vegetables, bulk grains, fresh meats, and fresh dairy. Cooking  Use low-heat cooking methods, such as baking, instead of high-heat cooking methods like deep frying.  Cook using healthy oils, such as olive, canola, or sunflower oil.  Avoid cooking with butter, cream, or high-fat meats. Meal planning  Eat meals and snacks regularly, preferably at the same times every day. Avoid going long periods of time without eating.  Eat foods high in fiber, such as fresh fruits, vegetables, beans, and whole grains. Talk to your dietitian about how many servings of carbs you can eat at each meal.  Eat 4-6 ounces (oz) of lean protein each day, such as lean meat, chicken, fish, eggs, or tofu. One oz of lean protein is equal to: ? 1 oz of meat, chicken, or fish. ? 1 egg. ?  cup of tofu.  Eat some foods each day that contain healthy fats, such as avocado, nuts, seeds, and  fish. Lifestyle  Check your blood glucose regularly.  Exercise regularly as told by your health care provider. This may include: ? 150 minutes of moderate-intensity or vigorous-intensity exercise each week. This could be brisk walking, biking, or water aerobics. ? Stretching and doing strength exercises, such as yoga or weightlifting, at least 2 times a week.  Take medicines as told by your health care provider.  Do not use any products that contain nicotine or tobacco, such as cigarettes and e-cigarettes. If you need help quitting, ask your health care provider.  Work with a Social worker or diabetes educator to identify strategies to manage stress and any emotional and social challenges. Questions to ask a health care provider  Do I need to meet with a diabetes educator?  Do I need to meet with a dietitian?  What number can I call if I have questions?  When are the best times to  times to check my blood glucose? Where to find more information:  American Diabetes Association: diabetes.org  Academy of Nutrition and Dietetics: www.eatright.org  National Institute of Diabetes and Digestive and Kidney Diseases (NIH): www.niddk.nih.gov Summary  A healthy meal plan will help you control your blood glucose and maintain a healthy lifestyle.  Working with a diet and nutrition specialist (dietitian) can help you make a meal plan that is best for you.  Keep in mind that carbohydrates (carbs) and alcohol have immediate effects on your blood glucose levels. It is important to count carbs and to use alcohol carefully. This information is not intended to replace advice given to you by your health care provider. Make sure you discuss any questions you have with your health care provider. Document Revised: 11/01/2017 Document Reviewed: 12/24/2016 Elsevier Patient Education  2020 Elsevier Inc.  

## 2020-01-12 NOTE — Assessment & Plan Note (Signed)
Chronic kidney disease we will follow-up basic metabolic panel associated type 2 diabetes with plus minus control will increase Metformin to 1000 mg twice daily and begin Jardiance 10 mg daily

## 2020-01-12 NOTE — Assessment & Plan Note (Signed)
Hypertension under adequate control at this time  We will continue diltiazem at 240 mg daily

## 2020-01-13 ENCOUNTER — Telehealth: Payer: Self-pay | Admitting: General Practice

## 2020-01-13 DIAGNOSIS — E1122 Type 2 diabetes mellitus with diabetic chronic kidney disease: Secondary | ICD-10-CM

## 2020-01-13 LAB — COMPREHENSIVE METABOLIC PANEL
ALT: 17 IU/L (ref 0–32)
AST: 11 IU/L (ref 0–40)
Albumin/Globulin Ratio: 1.6 (ref 1.2–2.2)
Albumin: 3.8 g/dL (ref 3.7–4.7)
Alkaline Phosphatase: 109 IU/L (ref 39–117)
BUN/Creatinine Ratio: 12 (ref 12–28)
BUN: 12 mg/dL (ref 8–27)
Bilirubin Total: 0.2 mg/dL (ref 0.0–1.2)
CO2: 23 mmol/L (ref 20–29)
Calcium: 9.6 mg/dL (ref 8.7–10.3)
Chloride: 101 mmol/L (ref 96–106)
Creatinine, Ser: 1.01 mg/dL — ABNORMAL HIGH (ref 0.57–1.00)
GFR calc Af Amer: 62 mL/min/{1.73_m2} (ref >59)
GFR calc non Af Amer: 54 mL/min/{1.73_m2} — ABNORMAL LOW (ref >59)
Globulin, Total: 2.4 g/dL (ref 1.5–4.5)
Glucose: 162 mg/dL — ABNORMAL HIGH (ref 65–99)
Potassium: 4.4 mmol/L (ref 3.5–5.2)
Sodium: 142 mmol/L (ref 134–144)
Total Protein: 6.2 g/dL (ref 6.0–8.5)

## 2020-01-13 LAB — CBC WITH DIFFERENTIAL/PLATELET
Basophils Absolute: 0 10*3/uL (ref 0.0–0.2)
Basos: 0 %
EOS (ABSOLUTE): 0.1 10*3/uL (ref 0.0–0.4)
Eos: 2 %
Hematocrit: 34.5 % (ref 34.0–46.6)
Hemoglobin: 11.5 g/dL (ref 11.1–15.9)
Immature Grans (Abs): 0.1 10*3/uL (ref 0.0–0.1)
Immature Granulocytes: 1 %
Lymphocytes Absolute: 1.7 10*3/uL (ref 0.7–3.1)
Lymphs: 21 %
MCH: 30.3 pg (ref 26.6–33.0)
MCHC: 33.3 g/dL (ref 31.5–35.7)
MCV: 91 fL (ref 79–97)
Monocytes Absolute: 0.6 10*3/uL (ref 0.1–0.9)
Monocytes: 8 %
Neutrophils Absolute: 5.7 10*3/uL (ref 1.4–7.0)
Neutrophils: 68 %
Platelets: 287 10*3/uL (ref 150–450)
RBC: 3.8 x10E6/uL (ref 3.77–5.28)
RDW: 13.5 % (ref 11.7–15.4)
WBC: 8.3 10*3/uL (ref 3.4–10.8)

## 2020-01-13 LAB — MICROALBUMIN, URINE: Microalbumin, Urine: 73.6 ug/mL

## 2020-01-13 LAB — VITAMIN B12: Vitamin B-12: 264 pg/mL (ref 232–1245)

## 2020-01-13 LAB — VITAMIN D 25 HYDROXY (VIT D DEFICIENCY, FRACTURES): Vit D, 25-Hydroxy: 29.6 ng/mL — ABNORMAL LOW (ref 30.0–100.0)

## 2020-01-13 MED ORDER — GLUCOSE BLOOD VI STRP: ORAL_STRIP | 2 refills | Status: DC

## 2020-01-13 NOTE — Telephone Encounter (Signed)
1) Medication(s) Requested (by name): glucose blood (ACCU-CHEK AVIVA) test strip   2) Pharmacy of Choice: [facility], [place], [place] [address]  3) Special Requests: Patient states that she is out of test strips.    Approved medications will be sent to the pharmacy, we will reach out if there is an issue.  Requests made after 3pm may not be addressed until the following business day!  If a patient is unsure of the name of the medication(s) please note and ask patient to call back when they are able to provide all info, do not send to responsible party until all information is available!

## 2020-01-13 NOTE — Telephone Encounter (Signed)
Rx sent 

## 2020-01-20 ENCOUNTER — Ambulatory Visit (HOSPITAL_COMMUNITY): Payer: Medicare Other

## 2020-01-20 ENCOUNTER — Ambulatory Visit (HOSPITAL_COMMUNITY)
Admission: RE | Admit: 2020-01-20 | Discharge: 2020-01-20 | Disposition: A | Discharge status: Home / Self Care | Payer: Medicare Other | Source: Ambulatory Visit | Attending: Critical Care Medicine | Admitting: Critical Care Medicine

## 2020-01-20 ENCOUNTER — Ambulatory Visit (HOSPITAL_BASED_OUTPATIENT_CLINIC_OR_DEPARTMENT_OTHER)
Admission: RE | Admit: 2020-01-20 | Discharge: 2020-01-20 | Disposition: A | Discharge status: Home / Self Care | Payer: Medicare Other | Source: Ambulatory Visit | Attending: Critical Care Medicine | Admitting: Critical Care Medicine

## 2020-01-20 ENCOUNTER — Other Ambulatory Visit: Payer: Self-pay

## 2020-01-20 DIAGNOSIS — J1282 Pneumonia due to coronavirus disease 2019: Secondary | ICD-10-CM | POA: Insufficient documentation

## 2020-01-20 DIAGNOSIS — M79605 Pain in left leg: Secondary | ICD-10-CM

## 2020-01-20 DIAGNOSIS — M79604 Pain in right leg: Secondary | ICD-10-CM | POA: Insufficient documentation

## 2020-01-20 DIAGNOSIS — U071 COVID-19: Secondary | ICD-10-CM | POA: Diagnosis present

## 2020-01-20 MED ORDER — IOHEXOL 350 MG/ML SOLN
80.0000 mL | Freq: Once | INTRAVENOUS | Status: AC | PRN
  Administered 2020-01-20: 80 mL via INTRAVENOUS

## 2020-01-20 NOTE — Progress Notes (Signed)
Lower extremity venous has been completed.   Preliminary results in CV Proc.   Abram Sander 01/20/2020 11:43 AM

## 2020-01-25 ENCOUNTER — Ambulatory Visit: Payer: Medicare Other | Admitting: Physical Therapy

## 2020-01-25 ENCOUNTER — Telehealth: Payer: Self-pay | Admitting: General Practice

## 2020-01-25 NOTE — Telephone Encounter (Signed)
Pt called to inform PCP that her feet have been swollen since 01/23/2020. They have now began to itch. She would like to know if she can increase her Fluid pill. Please advise

## 2020-01-25 NOTE — Telephone Encounter (Signed)
I spoke to the patient and she will increase lasix to two daily as needed for edema.  I reviewed all of her recent CT angio chest and Vascular studies

## 2020-02-03 ENCOUNTER — Other Ambulatory Visit: Payer: Self-pay

## 2020-02-03 ENCOUNTER — Ambulatory Visit: Payer: Medicare Other | Attending: Critical Care Medicine | Admitting: Physical Therapy

## 2020-02-03 ENCOUNTER — Encounter: Payer: Self-pay | Admitting: Physical Therapy

## 2020-02-03 DIAGNOSIS — J1282 Pneumonia due to coronavirus disease 2019: Secondary | ICD-10-CM | POA: Diagnosis present

## 2020-02-03 DIAGNOSIS — M6281 Muscle weakness (generalized): Secondary | ICD-10-CM | POA: Insufficient documentation

## 2020-02-03 DIAGNOSIS — U071 COVID-19: Secondary | ICD-10-CM | POA: Insufficient documentation

## 2020-02-03 DIAGNOSIS — R2689 Other abnormalities of gait and mobility: Secondary | ICD-10-CM | POA: Diagnosis present

## 2020-02-03 NOTE — Therapy (Signed)
[place], [place], [address] Phone: [phone]   Fax:  [phone]  Physical Therapy Evaluation  Patient Details  Name: Judy [name] MRN: [id number] Date of Birth: [birth year] Referring Provider (PT): Asencion Noble, MD    Encounter Date: 02/03/2020  PT End of Session - 02/03/20 1254    Visit Number  1    Number of Visits  16    Date for PT Re-Evaluation  03/30/20    PT Start Time  1000    PT Stop Time  1045    PT Time Calculation (min)  45 min    Activity Tolerance  Patient tolerated treatment well    Behavior During Therapy  [facility] for tasks assessed/performed       Past Medical History:  Diagnosis Date  . Anxiety   . Back pain   . Complication of anesthesia 2003   gallbladder surgery-resp. arrest-stop operation and pt. went to ICU - patient stated she has back surgery since then and had no trouble at all  . Depression   . Diabetes ([place])   . Dyspnea   . Gout   . History of kidney stones   . Hypertension   . Leg edema   . Pancreatitis, chronic ([place]) 03/2016  . Pneumonia due to COVID-19 virus 12/03/2019  . Sleep apnea     Past Surgical History:  Procedure Laterality Date  . BACK SURGERY    . BREAST LUMPECTOMY WITH RADIOACTIVE SEED LOCALIZATION Right 10/23/2019   Procedure: RIGHT BREAST LUMPECTOMY WITH RADIOACTIVE SEED LOCALIZATION;  Surgeon: Jovita Kussmaul, MD;  Location: [facility];  Service: General;  Laterality: Right;  . CHOLECYSTECTOMY    . COLONOSCOPY  2013    There were no vitals filed for this visit.   Subjective Assessment - 02/03/20 1013    Subjective  Patient had COVID in Dec/Jan and was hospitalized for 2 weeks.  She reports a decline in her balance and inability to bend forward to put shoes on and perform hygiene. She complains of fatigue, shortness of breath.  She had pneumonia has resolved per recent CXR.    Pertinent History  diabetes, LE edema    Limitations   Lifting;Standing;Walking;House hold activities;Other (comment)    How long can you walk comfortably?  can be up for 10 min at a time. , rests and then can do more.  No pain.    Patient Stated Goals  I want to be able to walk better and be able to put my socks and shoes on.    Currently in Pain?  No/denies         [facility] PT Assessment - 02/03/20 0001      Assessment   Medical Diagnosis  Post COVID pneumonia, falls     Referring Provider (PT)  Asencion Noble, MD     Onset Date/Surgical Date  12/03/19    Prior Therapy  PT for spine surgeries      Precautions   Precautions  Fall      Restrictions   Weight Bearing Restrictions  No      Balance Screen   Has the patient fallen in the past 6 months  Yes    How many times?  2    Has the patient had a decrease in activity level because of a fear of falling?   Yes    Is the patient reluctant to leave their home because of a fear of falling?   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 she will do that     Leisure  spent time with friends, to dinner, movies but limited since [place]   Overall Cognitive Status  Within Functional Limits for tasks assessed      Observation/Other Assessments   Focus on Therapeutic Outcomes (FOTO)   NT       Sensation   Light Touch  Appears Intact      Posture/Postural Control   Posture/Postural Control   Postural limitations    Posture Comments  not remarkable       AROM   Lumbar Flexion  limited, mild LLE pulling and back discomfort     Lumbar Extension  WFL     Lumbar - Right Rotation  stretch only     Lumbar - Left Rotation  stretch only       PROM   Overall PROM Comments  seated trunk flexion min pain, low back L       Strength   Right Hip Flexion  3+/5    Left Hip Flexion  3+/5    Right Knee Flexion  4+/5    Right Knee Extension  5/5    Left Knee Flexion  4+/5    Left Knee Extension  4/5    Right Ankle Dorsiflexion  5/5    Left Ankle Dorsiflexion  5/5      Palpation   Palpation comment  none with palpation to lumbar       Transfers   Five time sit to stand comments   19.8 sec, then 12 sec       Ambulation/Gait   Ambulation Distance (Feet)  725 Feet   standing rest breaks      6 Minute Walk- Baseline   6 Minute Walk- Baseline  yes    HR (bpm)  100    02 Sat (%RA)  95 %    Modified Borg Scale for Dyspnea  3- Moderate shortness of breath or breathing difficulty    Perceived Rate of Exertion (Borg)  13- Somewhat hard         Objective measurements completed on examination: See above findings.      [facility] Adult PT Treatment/Exercise - 02/03/20 0001      Knee/Hip Exercises: Seated   Sit to Sand  2 sets         PT Education - 02/03/20 1253    Education Details  PT/POC, HEP, endurance and 6 min walk test    Person(s) Educated  Patient    Methods  Demonstration;Explanation    Comprehension  Verbalized understanding       PT Short Term Goals - 02/03/20 1255      PT SHORT TERM GOAL #1   Title  Balance  screen(s) to be completed within 2 visits, goal set    Time  2    Period  Weeks    Status  New    Target Date  02/17/20      PT SHORT TERM GOAL #2   Title  Pt will be able to show I with HEP for LE and balance    Time  4    Period  Weeks    Status  New    Target Date  03/02/20      PT SHORT TERM GOAL #3   Title  Pt will tolerate 6 min walk test  and walk with RW for >800 feet , no more than mod dyspnea    Time  4    Period  Weeks    Status  New    Target Date  03/02/20        PT Long Term Goals - 02/03/20 1257      PT LONG TERM GOAL #1   Title  The patient will be indep with post d/c exercise and/or community wellness program.    Time  8    Period  Weeks    Status  New    Target Date  03/30/20      PT LONG TERM GOAL #2   Title  Pt will be able to improve 6 min walk test > 1000 feet with min dyspnea    Time  8    Period  Weeks    Status  New    Target Date  03/30/20      PT LONG TERM GOAL #3   Title  The patient will demonstrate proper squatting/ bending to pick up objects from the floor for improved ADL function in the home.    Time  8    Period  Weeks    Status  New    Target Date  03/30/20      PT LONG TERM GOAL #4   Title  The patient will demonstrate being able to reach feet for donning shoes, socks (either bending forward or bringing LE to contralateral knee to donn socks).    Time  8    Period  Weeks    Status  New    Target Date  03/30/20      PT LONG TERM GOAL #5   Title  Balance, fall risk goal TBA    Time  8    Period  Weeks    Status  New    Target Date  03/30/20             Plan - 02/03/20 1300    Clinical Impression Statement  Pt presents for low complexity eval of new onset of fatigue and post-COVID symptoms, in addition to balance issues.  In looking at her notes she has had issues with balance before but she has felt unsteady and overall weaker since begin hospitalized with COVID/pneumonia.  She has weakness in hips, core. LE edema and gets short of breath easily. 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  She now walks with a walker and will likley not be able to return to that job.  She lives with a roommate and needs her full independence.  O2 sats were stable at 95-97% during 6 min walk test.  She should do well with PT and recommend she attend for 4-8 weeks if needed.    Personal  Factors and Comorbidities  Age;Comorbidity 1;Past/Current Experience;Comorbidity 2    Comorbidities  diabtes, HTN, depressio    Examination-Activity Limitations  Squat;Stairs;Stand;Lift;Locomotion Level;Dressing    Examination-Participation Restrictions  Community Activity;Interpersonal Relationship;Other   unable to work as Tour manager  Stable/Uncomplicated    Clinical Decision Making  Low    Rehab Potential  Excellent    PT Frequency  2x / week    PT Duration  8 weeks    PT Treatment/Interventions  ADLs/Self Care Home Management;Therapeutic activities;Patient/family education;Cryotherapy;Functional mobility training;Neuromuscular re-education;Moist Heat;Therapeutic exercise;Balance training    PT Next Visit Plan  balance screen [place], step ups, develop HEP, trunk flexibility    PT Home Exercise Plan  none given    Consulted and Agree with Plan of Care  Patient       Patient will benefit from skilled therapeutic intervention in order to improve the following deficits and impairments:  Cardiopulmonary status limiting activity, Decreased mobility, Improper body mechanics, Obesity, Impaired flexibility, Increased edema, Decreased activity tolerance, Decreased strength, Difficulty walking, Decreased balance  Visit Diagnosis: Pneumonia due to COVID-19 virus  Muscle weakness (generalized)  Other abnormalities of gait and mobility     Problem List Patient Active Problem List   Diagnosis Date Noted  . Type 2 diabetes mellitus, uncontrolled ([place]) 01/12/2020  . Class 2 severe obesity with serious comorbidity and body mass index (BMI) of 35.0 to 35.9 in adult, unspecified obesity type ([place]) 12/25/2018  . Other fatigue 01/20/2018  . Type 2 diabetes mellitus with diabetic chronic kidney disease ([place]) 01/20/2018  . Vitamin D deficiency 01/20/2018  . B12 nutritional deficiency 01/20/2018  . Obstructive sleep apnea 11/04/2017  . Lumbar stenosis with neurogenic  claudication 11/19/2016  . HTN (hypertension) 03/21/2016  . Anxiety   . Depression     Alayasia Breeding 02/03/2020, 7:08 PM  [facility] [address] [place], [place], [address] Phone: [phone]   Fax:  [phone]  Name: [name] OCHOA MRN: [id number] Date of Birth: January 16, 1941   Raeford Razor, PT 02/03/20 7:09 PM Phone: [phone] Fax: [phone]

## 2020-02-09 ENCOUNTER — Ambulatory Visit: Payer: Medicare Other | Attending: Critical Care Medicine | Admitting: Critical Care Medicine

## 2020-02-09 ENCOUNTER — Ambulatory Visit: Payer: Medicare Other

## 2020-02-09 ENCOUNTER — Other Ambulatory Visit: Payer: Self-pay

## 2020-02-09 ENCOUNTER — Encounter: Payer: Self-pay | Admitting: Critical Care Medicine

## 2020-02-09 VITALS — BP 138/73 | HR 77 | Temp 98.2°F | Resp 18 | Ht 60.0 in | Wt 184.0 lb

## 2020-02-09 VITALS — BP 152/88 | HR 88

## 2020-02-09 DIAGNOSIS — Z794 Long term (current) use of insulin: Secondary | ICD-10-CM | POA: Diagnosis not present

## 2020-02-09 DIAGNOSIS — E785 Hyperlipidemia, unspecified: Secondary | ICD-10-CM

## 2020-02-09 DIAGNOSIS — E1169 Type 2 diabetes mellitus with other specified complication: Secondary | ICD-10-CM

## 2020-02-09 DIAGNOSIS — E1122 Type 2 diabetes mellitus with diabetic chronic kidney disease: Secondary | ICD-10-CM | POA: Diagnosis not present

## 2020-02-09 DIAGNOSIS — N1831 Chronic kidney disease, stage 3a: Secondary | ICD-10-CM

## 2020-02-09 DIAGNOSIS — J1282 Pneumonia due to coronavirus disease 2019: Secondary | ICD-10-CM

## 2020-02-09 DIAGNOSIS — E119 Type 2 diabetes mellitus without complications: Secondary | ICD-10-CM

## 2020-02-09 DIAGNOSIS — E559 Vitamin D deficiency, unspecified: Secondary | ICD-10-CM | POA: Diagnosis not present

## 2020-02-09 DIAGNOSIS — M6281 Muscle weakness (generalized): Secondary | ICD-10-CM

## 2020-02-09 DIAGNOSIS — I10 Essential (primary) hypertension: Secondary | ICD-10-CM

## 2020-02-09 DIAGNOSIS — U071 COVID-19: Secondary | ICD-10-CM | POA: Diagnosis not present

## 2020-02-09 DIAGNOSIS — E538 Deficiency of other specified B group vitamins: Secondary | ICD-10-CM

## 2020-02-09 DIAGNOSIS — G4733 Obstructive sleep apnea (adult) (pediatric): Secondary | ICD-10-CM | POA: Diagnosis not present

## 2020-02-09 DIAGNOSIS — R2689 Other abnormalities of gait and mobility: Secondary | ICD-10-CM

## 2020-02-09 DIAGNOSIS — Z6835 Body mass index (BMI) 35.0-35.9, adult: Secondary | ICD-10-CM

## 2020-02-09 LAB — GLUCOSE, POCT (MANUAL RESULT ENTRY): POC Glucose: 152 mg/dl — AB (ref 70–99)

## 2020-02-09 NOTE — Assessment & Plan Note (Signed)
Type 2 diabetes with chronic kidney disease  Note at last visit the creatinine had returned to 1.0  Therefore chronic kidney disease appears to be improved  Also glycemic control was improved as well on Jardiance 10 mg daily and Metformin 1000 mg twice daily

## 2020-02-09 NOTE — Patient Instructions (Addendum)
A CPAP titration study will be obtained in order to get you a new CPAP mask and machine  We will obtain a hemoglobin A1c from the lab today  Remember to take your furosemide in the morning  Stop taking an eating grapefruit and on the sugar on atorvastatin on atorvastatin, you can try oranges or apples instead  Please stop taking Klonopin and use Tofranil only and melatonin for sleep  Use your CPAP machine when you are taking a nap during the day  Continue your physical therapy  Return to see Dr. Joya Gaskins in 3 months  Keep your new PCP followup

## 2020-02-09 NOTE — Assessment & Plan Note (Signed)
Vitamin D deficiency appears to have resolved with recent vitamin D levels normal

## 2020-02-09 NOTE — Progress Notes (Signed)
Subjective:    Patient ID: Judy [name], female    DOB: 06/19/1941, 79 y.o.   MRN: [id number] History of Present Illness: 79 y.o.F post hosp for covid.  No pcp.  Hx HTN, OSA, T2DM, Depression, anxiety, Lumbar stenosis, Vit D and B12 deficiency   This is a telephone visit and post hospital visit as well for this patient who was in the hospital for Covid pneumonia between December 31 and January 8.  The patient used to have Dr. Zada Girt is here primary care physician but he just retired at the end of last year.  She is not yet achieved a new primary care physician.  The patient was admitted with hypoxemia and Covid pneumonia.  Since discharge she still does not have any energy has some headaches feels that she has nasal congestion notes some shortness of breath with exertion but no cough.  She has muscle aches but no fever.  She does not have a home pulse oximeter.  Physical therapy came to see her and she is walking better without the cane.  She is not yet gone out of her home.  She can do some ADLs but not many because of her situation.  She does have type 2 diabetes and her blood sugars have been running in the 140 range on 500 mg daily of Metformin.  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  She is wanting a when she can go back to work.  She has home aide coming in giving her baths.  She does not have a method to measure her blood pressure.  The blood pressure was elevated during the hospitalization.  Also she had hyperkalemia during the hospitalization.  She is on Aldactone.  Below is the discharge summary  Admit date: 12/03/2019 Discharge date: 12/11/2019  PCP: Patient, No Pcp Per  DISCHARGE DIAGNOSES:  Acute respiratory failure with hypoxia, resolved Pneumonia due to COVID-19 Chronic kidney disease stage IIIa Diabetes mellitus type 2 uncontrolled with hyperglycemia Essential hypertension History of anxiety  and depression  RECOMMENDATIONS FOR OUTPATIENT FOLLOW UP: 1. Home health    Home Health: PT and OT Equipment/Devices: 3n 1  CODE STATUS: Full code  DISCHARGE CONDITION: fair  Diet recommendation: Modified carbohydrate  INITIAL HISTORY: 79 y.o.femalepast medical history significant for diabetes mellitus type 2, essential hypertension chronic pain presents to the ED with 3 days of shortness of breath the patient reports that she was diagnosed with COVID-19  days ago during this time she been having menorrhagia anorexia with worsening over the next 6 days, and about 4 days prior to admission she started developing shortness of breath fever chills in the ED she was noted to be hypoxic in the 80s placed on 5 L and improved to 91%. She was also found to be hyponatremic hyperkalemic, SARS-CoV-2 PCR was positive chest x-ray showed right-sided infiltrates   HOSPITAL COURSE:   Acute respiratory failure with hypoxia secondarily to pneumonia due to COVID-19: Patient was hospitalized and placed on steroids and remdesivir.  She also received Actemra.  She started improving.  She is weaned down to room air.  She did feel anxious and nervous during this hospitalization.  Patient was reassured.  Her inflammatory markers improved.  Seen by PT and OT.  Home health will be ordered.  Son also lives close by and can provide assistance as well.  Chronic kidney disease stage IIIa/hyperkalemia With a baseline creatinine  1.2-1.5.Renal function has been stable. Hyperkalemia improved with Kayexalate.   Hypovolemic hyponatremia: In the setting of decreased oral intake and diuretic use. Her sodium improved with IV fluid hydration.  Diabetes mellitus type 2 uncontrolled with hyperglycemia/chronic kidney disease stage IIIa: Resume Metformin.HbA1c was 8.1.   Essential HTN (hypertension) Continue home medication  Chronic pain/anxiety/depression: Continue home  medications  Obesity Estimated body mass index is 35 kg/m as calculated from the following: Height as of this encounter: 5' (1.524 m). Weight as of this encounter: 81.3 kg.   Note her hemoglobin A1c was 8.1 during the recent hospitalization and she also had hypokalemia and hyponatremia along with chronic kidney disease   2/9 This is a return visit that is face-to-face from this patient who was in the hospital end of December through 8 January for COVID-19 pneumonia.  The last visit was a telephone visit.  Today she comes into the office and has been weaned off oxygen but still remains quite short of breath with minimal exertion.  She had home physical therapy but that has been discontinued and she is actually fallen twice since that time.  She uses a walker but does not use it consistently.  She does have BiPAP for sleep apnea but only uses it at night.  She does not have a pulse oximeter at home.  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  She notes that her blood sugars tend to be fasting 207 to 150 and postprandial 1 60-1 80  This patient had been on Aldactone but we have held this because of hyperkalemia she does maintain diltiazem daily for blood pressure.  She her primary care provider recently retired and she is going to be seeking a new primary care provider in private practice I did tell her I continue to see her for her pulmonary status  She is on zinc vitamin D and vitamin C supplementation at this time This patient also needs to have her vitamin B12 and vitamin D levels checked   02/09/2020 This is a pleasant 79 year old female seen today in return follow-up for severe Covid pneumonia and chronic hypoxic respiratory failure.  Note her respiratory failure has resolved and she is no longer on oxygen.  She is receiving medication for hypertension at 240 mg daily diltiazem.  Patient also has type 2 diabetes with  chronic kidney disease and recent follow-up being that showed improvement in renal function and she was continued on Metformin and Jardiance.  Note in the interim she is obtained a new primary care provider and is establishing with this physician.  We will share records with this physician as well.  I told the patient today that we would prefer that I manage her pulmonary aspects and let her primary care physician manage the rest of her medical issues.  She still needing a walker to move about and is very wobbly when she ambulates without assistance.  She knows to use the cane more regularly and the walker when needed.  She is continue her exercises from home physical therapy.  And she is going out outpatient physical therapy as well.  She has difficulty with sleeping at night and states the Klonopin is not helping as much and causes her to sleep over to 10:30 in the morning.  She is also on the Tofranil as well.  She is yet to try melatonin.  Also she has an old CPAP machine and is wishing to have  this replaced as the mask is falling apart and the machine is 78 years old.  She will need a follow-up CPAP titration study and mask desensitization and I indicated her I would manage this  Note at the last visit her urine microalbumin was normal she does however need a follow-up A1c and I am assuming her primary care provider will obtain this and the patient was unclear of this therefore we will obtain an A1c at this visit  Past Medical History:  Diagnosis Date  . Anxiety   . Back pain   . Complication of anesthesia 2003   gallbladder surgery-resp. arrest-stop operation and pt. went to ICU - patient stated she has back surgery since then and had no trouble at all  . Depression   . Diabetes ([place])   . Dyspnea   . Gout   . History of kidney stones   . Hypertension   . Leg edema   . Pancreatitis, chronic ([place]) 03/2016  . Pneumonia due to COVID-19 virus 12/03/2019  . Sleep apnea      Family History   Problem Relation Age of Onset  . Dementia Mother   . AAA (abdominal aortic aneurysm) Father   . Breast cancer Maternal Aunt      Social History   Socioeconomic History  . Marital status: Divorced    Spouse name: Not on file  . Number of children: Not on file  . Years of education: Not on file  . Highest education level: Not on file  Occupational History  . Occupation: CNA  Tobacco Use  . Smoking status: Never Smoker  . Smokeless tobacco: Never Used  Substance and Sexual Activity  . Alcohol use: No    Alcohol/week: 0.0 standard drinks  . Drug use: No  . Sexual activity: Not Currently  Other Topics Concern  . Not on file  Social History Narrative  . Not on file   Social Determinants of Health   Financial Resource Strain:   . Difficulty of Paying Living Expenses: Not on file  Food Insecurity:   . Worried About Charity fundraiser in the Last Year: Not on file  . Ran Out of Food in the Last Year: Not on file  Transportation Needs:   . Lack of Transportation (Medical): Not on file  . Lack of Transportation (Non-Medical): Not on file  Physical Activity:   . Days of Exercise per Week: Not on file  . Minutes of Exercise per Session: Not on file  Stress:   . Feeling of Stress : Not on file  Social Connections:   . Frequency of Communication with Friends and Family: Not on file  . Frequency of Social Gatherings with Friends and Family: Not on file  . Attends Religious Services: Not on file  . Active Member of Clubs or Organizations: Not on file  . Attends Archivist Meetings: Not on file  . Marital Status: Not on file  Intimate Partner Violence:   . Fear of Current or Ex-Partner: Not on file  . Emotionally Abused: Not on file  . Physically Abused: Not on file  . Sexually Abused: Not on file     No Known Allergies   Outpatient Medications Prior to Visit  Medication Sig Dispense Refill  . atorvastatin (LIPITOR) 20 MG tablet Take 20 mg by mouth daily.     . clonazePAM (KLONOPIN) 0.5 MG tablet Take 1 tablet (0.5 mg total) by mouth at bedtime. 30 tablet 0  . diltiazem (CARDIZEM  CD) 240 MG 24 hr capsule Take 1 capsule (240 mg total) by mouth daily. 30 capsule 4  . empagliflozin (JARDIANCE) 10 MG TABS tablet Take 10 mg by mouth daily. 30 tablet 4  . etodolac (LODINE) 400 MG tablet Take 1 tablet (400 mg total) by mouth daily as needed for moderate pain. (Patient not taking: Reported on 02/03/2020) 30 tablet 0  . furosemide (LASIX) 20 MG tablet Take 1 tablet (20 mg total) by mouth daily as needed for edema. 30 tablet 4  . glucose blood (ACCU-CHEK AVIVA) test strip Test twice daily 100 each 2  . imipramine (TOFRANIL) 50 MG tablet Take 1 tablet (50 mg total) by mouth at bedtime. 30 tablet 4  . Lancets (ACCU-CHEK SAFE-T PRO) lancets Test twice daily 100 each 11  . metFORMIN (GLUCOPHAGE) 1000 MG tablet Take 1 tablet (1,000 mg total) by mouth 2 (two) times daily with a meal. 60 tablet 4  . NON FORMULARY 1 each by Other route See admin instructions. CPAP machine nightly    . nystatin cream (MYCOSTATIN)      No facility-administered medications prior to visit.      Review of Systems Constitutional:     weight loss, night sweats,  Fevers, chills, fatigue, lassitude. HEENT:    headaches,  Difficulty swallowing,  Tooth/dental problems,  Sore throat,                No sneezing, itching, ear ache, nasal congestion, post nasal drip,   CV:  No chest pain,  Orthopnea, PND, swelling in lower extremities, anasarca, dizziness, palpitations  GI  No heartburn, indigestion, abdominal pain, nausea, vomiting, diarrhea, change in bowel habits, loss of appetite  Resp:  shortness of breath with exertion or at rest.  No excess mucus, no productive cough,  No non-productive cough,  No coughing up of blood.  No change in color of mucus.  No wheezing.  No chest wall deformity  Skin: no rash or lesions.  GU: no dysuria, change in color of urine, no urgency or frequency.  No  flank pain.  MS:  No joint pain or swelling.  No decreased range of motion.  No back pain.  Muscle aches  Psych:  No change in mood or affect. No depression or anxiety.  No memory loss.     Objective:   Physical Exam  Vitals:   02/09/20 1035  BP: 138/73  Pulse: 77  Resp: 18  Temp: 98.2 F (36.8 C)  TempSrc: Oral  SpO2: 95%  Weight: 184 lb (83.5 kg)  Height: 5' (1.524 m)    Gen: Pleasant, obese , in no distress,  normal affect  ENT: No lesions,  mouth clear,  oropharynx clear, no postnasal drip  Neck: No JVD, no TMG, no carotid bruits  Lungs: No use of accessory muscles, no dullness to percussion, clear without rales or rhonchi  Cardiovascular: RRR, heart sounds normal, no murmur or gallops, no peripheral edema  Abdomen: soft and NT, no HSM,  BS normal  Musculoskeletal: No deformities, no cyanosis or clubbing  Neuro: alert, non focal Note on her get up and go exam from the chair to walking she is quite wobbly in nature    Skin: Warm, no lesions or rashes   All labs from recent hospitalization are reviewed BMP Latest Ref Rng & Units 01/12/2020 12/10/2019 12/08/2019  Glucose 65 - 99 mg/dL 162(H) 68(L) 63(L)  BUN 8 - 27 mg/dL 12 37(H) 32(H)  Creatinine 0.57 - 1.00 mg/dL 1.01(H) 1.07(H)  1.00  BUN/Creat Ratio 12 - 28 12 - -  Sodium 134 - 144 mmol/L 142 132(L) 135  Potassium 3.5 - 5.2 mmol/L 4.4 4.9 4.8  Chloride 96 - 106 mmol/L 101 93(L) 99  CO2 20 - 29 mmol/L _0 Calcium 8.7 - 10.3 mg/dL 9.6 9.3 9.5   Hepatic Function Latest Ref Rng & Units 01/12/2020 12/08/2019 12/07/2019  Total Protein 6.0 - 8.5 g/dL 6.2 5.9(L) 5.9(L)  Albumin 3.7 - 4.7 g/dL 3.8 2.9(L) 2.8(L)  AST 0 - 40 IU/L 11 37 23  ALT 0 - 32 IU/L 17 40 28  Alk Phosphatase 39 - 117 IU/L 109 61 65  Total Bilirubin 0.0 - 1.2 mg/dL 0.2 0.7 0.7   CBC Latest Ref Rng & Units 01/12/2020 12/10/2019 12/08/2019  WBC 3.4 - 10.8 x10E3/uL 8.3 14.0(H) 20.4(H)  Hemoglobin 11.1 - 15.9 g/dL 11.5 14.2 13.1  Hematocrit 34.0 -  46.6 % 34.5 42.9 39.4  Platelets 150 - 450 x10E3/uL 287 506(H) 425(H)   CBG 152 today 3/9    Assessment & Plan:  I personally reviewed all images and lab data in the [facility] system as well as any outside material available during this office visit and agree with the  radiology impressions.   Obstructive sleep apnea History of obstructive sleep apnea and the patient will need to CPAP titration study to obtain a new device and mask fit  Have ordered a CPAP titration for home study  Type 2 diabetes mellitus with diabetic chronic kidney disease ([place]) Type 2 diabetes with chronic kidney disease  Note at last visit the creatinine had returned to 1.0  Therefore chronic kidney disease appears to be improved  Also glycemic control was improved as well on Jardiance 10 mg daily and Metformin 1000 mg twice daily  Hyperlipidemia associated with type 2 diabetes mellitus ([place]) Hyperlipidemia on atorvastatin stable at this time with liver function stable as well  Vitamin D deficiency Vitamin D deficiency appears to have resolved with recent vitamin D levels normal  B12 nutritional deficiency B12 levels recently checked were normal as well   Chanita was seen today for follow-up.  Diagnoses and all orders for this visit:  Type 2 diabetes mellitus with stage 3a chronic kidney disease, without long-term current use of insulin (HCC) -     Glucose (CBG) -     Hemoglobin A1c  Vitamin D deficiency  Class 2 severe obesity with serious comorbidity and body mass index (BMI) of 35.0 to 35.9 in adult, unspecified obesity type (HCC)  Obstructive sleep apnea -     Cpap titration; Future -     Desensitization mask fit; Future  Controlled type 2 diabetes mellitus without complication, with long-term current use of insulin ([place])  Essential hypertension  Hyperlipidemia associated with type 2 diabetes mellitus ([place])  B12 nutritional deficiency   I will send all records to the patient's new primary care  provider The patient will continue physical therapy and continue her new diet we will also obtain a home sleep study

## 2020-02-09 NOTE — Assessment & Plan Note (Signed)
Hyperlipidemia on atorvastatin stable at this time with liver function stable as well

## 2020-02-09 NOTE — Assessment & Plan Note (Signed)
History of obstructive sleep apnea and the patient will need to CPAP titration study to obtain a new device and mask fit  Have ordered a CPAP titration for home study

## 2020-02-09 NOTE — Assessment & Plan Note (Signed)
B12 levels recently checked were normal as well

## 2020-02-10 LAB — HEMOGLOBIN A1C
Est. average glucose Bld gHb Est-mCnc: 157 mg/dL
Hgb A1c MFr Bld: 7.1 % — ABNORMAL HIGH (ref 4.8–5.6)

## 2020-02-10 NOTE — Therapy (Signed)
[place], [place], [address] Phone: [phone]   Fax:  [phone]  Physical Therapy Treatment  Patient Details  Name: Judy [name] MRN: [id number] Date of Birth: 10/13/1941 Referring Provider (PT): Asencion Noble, MD    Encounter Date: 02/09/2020  PT End of Session - 02/10/20 0558    Visit Number  2    Number of Visits  16    Date for PT Re-Evaluation  03/30/20    PT Start Time  1630    PT Stop Time  [id number]    PT Time Calculation (min)  45 min    Activity Tolerance  Patient tolerated treatment well    Behavior During Therapy  [facility] for tasks assessed/performed       Past Medical History:  Diagnosis Date  . Anxiety   . Back pain   . Complication of anesthesia 2003   gallbladder surgery-resp. arrest-stop operation and pt. went to ICU - patient stated she has back surgery since then and had no trouble at all  . Depression   . Diabetes ([place])   . Dyspnea   . Gout   . History of kidney stones   . Hypertension   . Leg edema   . Pancreatitis, chronic ([place]) 03/2016  . Pneumonia due to COVID-19 virus 12/03/2019  . Sleep apnea     Past Surgical History:  Procedure Laterality Date  . BACK SURGERY    . BREAST LUMPECTOMY WITH RADIOACTIVE SEED LOCALIZATION Right 10/23/2019   Procedure: RIGHT BREAST LUMPECTOMY WITH RADIOACTIVE SEED LOCALIZATION;  Surgeon: Jovita Kussmaul, MD;  Location: [place];  Service: General;  Laterality: Right;  . CHOLECYSTECTOMY    . COLONOSCOPY  2013    Vitals:   02/10/20 0551  BP: (!) 152/88  Pulse: 88  SpO2: 98%    Subjective Assessment - 02/10/20 0552    Subjective  Pt reports her MD wants her to use a RW in the community and her cane at home. pt stated she would like to be able to put on her socks and shoes without the assistance of an aide device.                       [facility] Adult PT Treatment/Exercise - 02/10/20 0001      Ambulation/Gait   Ambulation/Gait  Yes    Ambulation/Gait Assistance  7: Independent    Ambulation Distance (Feet)  170 Feet   Single attempt   Assistive device  Standard walker;Rolling walker    Gait Pattern  Decreased stride length    Gait Comments  Somewhat hard RPE; Pursed lip breathing for recovery      Exercises   Exercises  Knee/Hip;Lumbar      Lumbar Exercises: Stretches   Single Knee to Chest Stretch  Right;Left;Limitations    Single Knee to Chest Stretch Limitations  10 reps    Lower Trunk Rotation  Limitations    Lower Trunk Rotation Limitations  10 reps      Knee/Hip Exercises: Standing   Heel Raises  Both;10 reps    Knee Flexion  Strengthening;Both;10 reps    Hip Flexion  Stengthening;Both;10 reps    Hip Abduction  Stengthening;10 reps;Both    Hip Extension  Stengthening;Both;10 reps    Functional Squat  10 seconds;Limitations    Functional Squat Limitations  Partial    Other Standing Knee Exercises  Toe raises 10x  PT Education - 02/10/20 0555    Education Details  HEP including a walking program 3x daily; managing her RPE to not exceed Somewhat Hard exertion level ([place]); utilizing pursed lip breathing for recovery after DOE/exertion    Person(s) Educated  Patient    Methods  Explanation;Demonstration;Tactile cues;Verbal cues    Comprehension  Verbalized understanding;Returned demonstration;Verbal cues required;Tactile cues required       PT Short Term Goals - 02/03/20 1255      PT SHORT TERM GOAL #1   Title  Balance screen(s) to be completed within 2 visits, goal set    Time  2    Period  Weeks    Status  New    Target Date  02/17/20      PT SHORT TERM GOAL #2   Title  Pt will be able to show I with HEP for LE and balance    Time  4    Period  Weeks    Status  New    Target Date  03/02/20      PT SHORT TERM GOAL #3   Title  Pt will tolerate 6 min walk test and walk with RW for >800 feet , no more than mod dyspnea    Time  4    Period  Weeks     Status  New    Target Date  03/02/20        PT Long Term Goals - 02/03/20 1257      PT LONG TERM GOAL #1   Title  The patient will be indep with post d/c exercise and/or community wellness program.    Time  8    Period  Weeks    Status  New    Target Date  03/30/20      PT LONG TERM GOAL #2   Title  Pt will be able to improve 6 min walk test > 1000 feet with min dyspnea    Time  8    Period  Weeks    Status  New    Target Date  03/30/20      PT LONG TERM GOAL #3   Title  The patient will demonstrate proper squatting/ bending to pick up objects from the floor for improved ADL function in the home.    Time  8    Period  Weeks    Status  New    Target Date  03/30/20      PT LONG TERM GOAL #4   Title  The patient will demonstrate being able to reach feet for donning shoes, socks (either bending forward or bringing LE to contralateral knee to donn socks).    Time  8    Period  Weeks    Status  New    Target Date  03/30/20      PT LONG TERM GOAL #5   Title  Balance, fall risk goal TBA    Time  8    Period  Weeks    Status  New    Target Date  03/30/20            Plan - 02/10/20 0600    Clinical Impression Statement  Pt demonstrated appropriate understanding of HEP including walking program, appropriate exertion level, and the use of pursed lip breathing for recovery of DOE which she use as needed during today's PT session. Pt is now using a RW in the community and a cane at home for safety following an appt.  with her MD. Pt's gait tolerance was limited to 174ft with her reporting a Somewhat Hard RPE.    Personal Factors and Comorbidities  Age;Comorbidity 1;Past/Current Experience;Comorbidity 2    Comorbidities  diabtes, HTN, depressio    PT Treatment/Interventions  ADLs/Self Care Home Management;Therapeutic activities;Patient/family education;Cryotherapy;Functional mobility training;Neuromuscular re-education;Moist Heat;Therapeutic exercise;Balance training    PT Next  Visit Plan  Nustep; continue trunk/LE flexibility, complete Berg and TUG .    PT Home Exercise Plan  [id number]       Patient will benefit from skilled therapeutic intervention in order to improve the following deficits and impairments:  Cardiopulmonary status limiting activity, Decreased mobility, Improper body mechanics, Obesity, Impaired flexibility, Increased edema, Decreased activity tolerance, Decreased strength, Difficulty walking, Decreased balance  Visit Diagnosis: Pneumonia due to COVID-19 virus  Muscle weakness (generalized)  Other abnormalities of gait and mobility     Problem List Patient Active Problem List   Diagnosis Date Noted  . Hyperlipidemia associated with type 2 diabetes mellitus ([place]) 02/09/2020  . Type 2 diabetes mellitus, controlled ([place]) 01/12/2020  . Class 2 severe obesity with serious comorbidity and body mass index (BMI) of 35.0 to 35.9 in adult, unspecified obesity type ([place]) 12/25/2018  . Obstructive sleep apnea 11/04/2017  . Lumbar stenosis with neurogenic claudication 11/19/2016  . HTN (hypertension) 03/21/2016  . Anxiety   . Depression     [facility] [address] [place], [place], [address] Phone: [phone]   Fax:  [phone]  Name: Judy [name] MRN: [id number] Date of Birth: 1941-07-21    Gar Ponto MS, PT 02/10/20 6:26 AM

## 2020-02-15 ENCOUNTER — Other Ambulatory Visit: Payer: Self-pay

## 2020-02-15 ENCOUNTER — Ambulatory Visit: Payer: Medicare Other

## 2020-02-15 VITALS — HR 88

## 2020-02-15 DIAGNOSIS — M6281 Muscle weakness (generalized): Secondary | ICD-10-CM

## 2020-02-15 DIAGNOSIS — J1282 Pneumonia due to coronavirus disease 2019: Secondary | ICD-10-CM

## 2020-02-15 DIAGNOSIS — U071 COVID-19: Secondary | ICD-10-CM

## 2020-02-15 DIAGNOSIS — R2689 Other abnormalities of gait and mobility: Secondary | ICD-10-CM

## 2020-02-15 NOTE — Patient Instructions (Signed)
Balances exs: single leg stands, tandem standing, and side stepping were added to the pt's HEP

## 2020-02-16 NOTE — Therapy (Signed)
[place], [place], [address] Phone: [phone]   Fax:  [phone]  Physical Therapy Treatment  Patient Details  Name: Judy [name] MRN: [id number] Date of Birth: Mar 19, 1941 Referring Provider (PT): Asencion Noble, MD    Encounter Date: 02/15/2020  PT End of Session - 02/15/20 1513    Visit Number  3    Number of Visits  16    Date for PT Re-Evaluation  03/30/20    PT Start Time  1401    PT Stop Time  1446    PT Time Calculation (min)  45 min    Activity Tolerance  Patient tolerated treatment well    Behavior During Therapy  [facility] for tasks assessed/performed       Past Medical History:  Diagnosis Date  . Anxiety   . Back pain   . Complication of anesthesia 2003   gallbladder surgery-resp. arrest-stop operation and pt. went to ICU - patient stated she has back surgery since then and had no trouble at all  . Depression   . Diabetes ([place])   . Dyspnea   . Gout   . History of kidney stones   . Hypertension   . Leg edema   . Pancreatitis, chronic ([place]) 03/2016  . Pneumonia due to COVID-19 virus 12/03/2019  . Sleep apnea     Past Surgical History:  Procedure Laterality Date  . BACK SURGERY    . BREAST LUMPECTOMY WITH RADIOACTIVE SEED LOCALIZATION Right 10/23/2019   Procedure: RIGHT BREAST LUMPECTOMY WITH RADIOACTIVE SEED LOCALIZATION;  Surgeon: Jovita Kussmaul, MD;  Location: [place];  Service: General;  Laterality: Right;  . CHOLECYSTECTOMY    . COLONOSCOPY  2013    Vitals:   02/15/20 1416  Pulse: 88  SpO2: 95%    Subjective Assessment - 02/15/20 1417    Subjective  Pt reports she is doing well today. She is not in any pain. Pt reports she has been doing her HEP.         [facility] PT Assessment - 02/16/20 0001      Functional Tests   Functional tests  Other;Gilberto Better      Other:   Other/ Comments  Berg balance 38/56      Other:   Other/Comments  TUG= 14.3, 12.9 ave.13.6                    [facility] Adult PT Treatment/Exercise - 02/16/20 0001      Balance   Balance Assessed  Yes      Standardized Balance Assessment   Standardized Balance Assessment  Timed Up and Go Test;Berg Balance Test      Berg Balance Test   Sit to Stand  Able to stand without using hands and stabilize independently    Standing Unsupported  Able to stand safely 2 minutes    Sitting with Back Unsupported but Feet Supported on Floor or Stool  Able to sit 2 minutes under supervision    Stand to Sit  Controls descent by using hands    Transfers  Able to transfer safely, definite need of hands    Standing Unsupported with Eyes Closed  Able to stand 3 seconds    Standing Ubsupported with Feet Together  Able to place feet together independently and stand for 1 minute with supervision    From Standing, Reach Forward with Outstretched Arm  Can reach forward >12 cm safely (5")    From Standing Position,  Pick up Object from Floor  Unable to pick up shoe, but reaches 2-5 cm (1-2") from shoe and balances independently    From Standing Position, Turn to Look Behind Over each Shoulder  Looks behind one side only/other side shows less weight shift    Turn 360 Degrees  Able to turn 360 degrees safely but slowly    Standing Unsupported, Alternately Place Feet on Step/Stool  Able to complete 4 steps without aid or supervision    Standing Unsupported, One Foot in Front  Able to take small step independently and hold 30 seconds    Standing on One Leg  Able to lift leg independently and hold equal to or more than 3 seconds    Total Score  38      Timed Up and Go Test   TUG  Normal TUG    Normal TUG (seconds)  13.6      High Level Balance   High Level Balance Activities  Side stepping   44ft x 4   High Level Balance Comments  SIngle leg stands and tandem standing; 3 reps each LE, 20 sec hold.      Lumbar Exercises: Aerobic   Nustep  10 mins; Level 1; post 7 mins =O2 sat% 96%,, HR 98; post 10  mins, O2 sat 97%, HR 92.      Knee/Hip Exercises: Standing   Heel Raises  Both;10 reps    Knee Flexion  Strengthening;Both;10 reps    Hip Flexion  Stengthening;Both;10 reps    Hip Abduction  Stengthening;10 reps;Both    Hip Extension  Stengthening;Both;10 reps             PT Education - 02/15/20 1510    Education Details  Education on managing RPE c actvity and use of pursed lip breathing for DOE. Encouraged pt to walk at home for longer distances without exceeding a Somewaht Hard RPE.    Person(s) Educated  Patient    Methods  Explanation;Demonstration;Verbal cues    Comprehension  Verbalized understanding;Returned demonstration;Verbal cues required       PT Short Term Goals - 02/03/20 1255      PT SHORT TERM GOAL #1   Title  Balance screen(s) to be completed within 2 visits, goal set    Time  2    Period  Weeks    Status  New    Target Date  02/17/20      PT SHORT TERM GOAL #2   Title  Pt will be able to show I with HEP for LE and balance    Time  4    Period  Weeks    Status  New    Target Date  03/02/20      PT SHORT TERM GOAL #3   Title  Pt will tolerate 6 min walk test and walk with RW for >800 feet , no more than mod dyspnea    Time  4    Period  Weeks    Status  New    Target Date  03/02/20        PT Long Term Goals - 02/03/20 1257      PT LONG TERM GOAL #1   Title  The patient will be indep with post d/c exercise and/or community wellness program.    Time  8    Period  Weeks    Status  New    Target Date  03/30/20      PT LONG TERM GOAL #2  Title  Pt will be able to improve 6 min walk test > 1000 feet with min dyspnea    Time  8    Period  Weeks    Status  New    Target Date  03/30/20      PT LONG TERM GOAL #3   Title  The patient will demonstrate proper squatting/ bending to pick up objects from the floor for improved ADL function in the home.    Time  8    Period  Weeks    Status  New    Target Date  03/30/20      PT LONG TERM GOAL  #4   Title  The patient will demonstrate being able to reach feet for donning shoes, socks (either bending forward or bringing LE to contralateral knee to donn socks).    Time  8    Period  Weeks    Status  New    Target Date  03/30/20      PT LONG TERM GOAL #5   Title  Balance, fall risk goal TBA    Time  8    Period  Weeks    Status  New    Target Date  03/30/20            Plan - 02/15/20 1514    Clinical Impression Statement  Pt tolerated 10 mins. on the Nustep managing her RPE at a Somewhat Hard level or less. O2 sats were 96%-97%. Berg Balance test = 38/56 indicating decreased balance and supports the need for use of the RW c community amb. and the [facility] in her home.    PT Treatment/Interventions  ADLs/Self Care Home Management;Therapeutic activities;Patient/family education;Cryotherapy;Functional mobility training;Neuromuscular re-education;Moist Heat;Therapeutic exercise;Balance training    PT Next Visit Plan  Balance activities; LE and core strengthening; LE and trunk flexibility       Patient will benefit from skilled therapeutic intervention in order to improve the following deficits and impairments:  Cardiopulmonary status limiting activity, Decreased mobility, Improper body mechanics, Obesity, Impaired flexibility, Increased edema, Decreased activity tolerance, Decreased strength, Difficulty walking, Decreased balance  Visit Diagnosis: Pneumonia due to COVID-19 virus  Muscle weakness (generalized)  Other abnormalities of gait and mobility     Problem List Patient Active Problem List   Diagnosis Date Noted  . Hyperlipidemia associated with type 2 diabetes mellitus ([place]) 02/09/2020  . Type 2 diabetes mellitus, controlled ([place]) 01/12/2020  . Class 2 severe obesity with serious comorbidity and body mass index (BMI) of 35.0 to 35.9 in adult, unspecified obesity type ([place]) 12/25/2018  . Obstructive sleep apnea 11/04/2017  . Lumbar stenosis with neurogenic  claudication 11/19/2016  . HTN (hypertension) 03/21/2016  . Anxiety   . Depression     Gar Ponto 02/16/2020, 6:25 AM  [facility] [address] [place], [place], [address] Phone: [phone]   Fax:  [phone]  Name: Judy [name] MRN: [id number] Date of Birth: 1941/04/06

## 2020-02-17 ENCOUNTER — Other Ambulatory Visit: Payer: Self-pay

## 2020-02-17 ENCOUNTER — Ambulatory Visit: Payer: Medicare Other

## 2020-02-17 VITALS — BP 132/70 | HR 86

## 2020-02-17 DIAGNOSIS — J1282 Pneumonia due to coronavirus disease 2019: Secondary | ICD-10-CM

## 2020-02-17 DIAGNOSIS — R2689 Other abnormalities of gait and mobility: Secondary | ICD-10-CM

## 2020-02-17 DIAGNOSIS — U071 COVID-19: Secondary | ICD-10-CM | POA: Diagnosis not present

## 2020-02-17 DIAGNOSIS — M6281 Muscle weakness (generalized): Secondary | ICD-10-CM

## 2020-02-17 NOTE — Therapy (Signed)
[place], [place], [address] Phone: [phone]   Fax:  [phone]  Physical Therapy Treatment  Patient Details  Name: Judy [name] MRN: [id number] Date of Birth: 11-26-1941 Referring Provider (PT): Asencion Noble, MD    Encounter Date: 02/17/2020  PT End of Session - 02/17/20 1426    Visit Number  4    Number of Visits  16    Date for PT Re-Evaluation  03/30/20    PT Start Time  [id number]    PT Stop Time  1449    PT Time Calculation (min)  45 min    Activity Tolerance  Patient tolerated treatment well    Behavior During Therapy  [facility] for tasks assessed/performed       Past Medical History:  Diagnosis Date  . Anxiety   . Back pain   . Complication of anesthesia 2003   gallbladder surgery-resp. arrest-stop operation and pt. went to ICU - patient stated she has back surgery since then and had no trouble at all  . Depression   . Diabetes ([place])   . Dyspnea   . Gout   . History of kidney stones   . Hypertension   . Leg edema   . Pancreatitis, chronic ([place]) 03/2016  . Pneumonia due to COVID-19 virus 12/03/2019  . Sleep apnea     Past Surgical History:  Procedure Laterality Date  . BACK SURGERY    . BREAST LUMPECTOMY WITH RADIOACTIVE SEED LOCALIZATION Right 10/23/2019   Procedure: RIGHT BREAST LUMPECTOMY WITH RADIOACTIVE SEED LOCALIZATION;  Surgeon: Jovita Kussmaul, MD;  Location: [facility];  Service: General;  Laterality: Right;  . CHOLECYSTECTOMY    . COLONOSCOPY  2013    Vitals:   02/17/20 1410  BP: 132/70  Pulse: 86  SpO2: 96%    Subjective Assessment - 02/17/20 1407    Subjective  Pt reports she has been walking for exercise and using pursed lip breathing for DOE assistance. pt reports she is completing her ther ex for LE strengthening and balance.                       [place] Adult PT Treatment/Exercise - 02/17/20 0001      Ambulation/Gait   Ambulation Distance (Feet)   480 Feet   limited by an RPE of Somewhat hard; O2 sat 96%, HR 100.   Assistive device  Rolling walker    Gait Pattern  --   improved pace     High Level Balance   High Level Balance Activities  Side stepping   74ft x 4   High Level Balance Comments  SIngle leg stands and tandem standing; 3 reps each LE, 20 sec hold.      Lumbar Exercises: Aerobic   Nustep  10 mins; Level 1; post 7 mins =O2 sat% 96%,, HR 98; post 10 mins, O2 sat 97%, HR 92.             PT Education - 02/17/20 1418    Education Details  Continued education on RPE management and use of pursed lip breathing for DOE       PT Short Term Goals - 02/03/20 1255      PT SHORT TERM GOAL #1   Title  Balance screen(s) to be completed within 2 visits, goal set    Time  2    Period  Weeks    Status  New    Target Date  02/17/20      PT SHORT TERM GOAL #2   Title  Pt will be able to show I with HEP for LE and balance    Time  4    Period  Weeks    Status  New    Target Date  03/02/20      PT SHORT TERM GOAL #3   Title  Pt will tolerate 6 min walk test and walk with RW for >800 feet , no more than mod dyspnea    Time  4    Period  Weeks    Status  New    Target Date  03/02/20        PT Long Term Goals - 02/03/20 1257      PT LONG TERM GOAL #1   Title  The patient will be indep with post d/c exercise and/or community wellness program.    Time  8    Period  Weeks    Status  New    Target Date  03/30/20      PT LONG TERM GOAL #2   Title  Pt will be able to improve 6 min walk test > 1000 feet with min dyspnea    Time  8    Period  Weeks    Status  New    Target Date  03/30/20      PT LONG TERM GOAL #3   Title  The patient will demonstrate proper squatting/ bending to pick up objects from the floor for improved ADL function in the home.    Time  8    Period  Weeks    Status  New    Target Date  03/30/20      PT LONG TERM GOAL #4   Title  The patient will demonstrate being able to reach feet for  donning shoes, socks (either bending forward or bringing LE to contralateral knee to donn socks).    Time  8    Period  Weeks    Status  New    Target Date  03/30/20      PT LONG TERM GOAL #5   Title  Balance, fall risk goal TBA    Time  8    Period  Weeks    Status  New    Target Date  03/30/20            Plan - 02/17/20 2256    Clinical Impression Statement  pt demonstrated improved endurance and quality of gait and tolerance to amb today. Pt is walking at an improved pace and her walking distance has increase prior to reaching a Somewhat Hard level of function. O2 sat levels are staying above 96%.    PT Treatment/Interventions  ADLs/Self Care Home Management;Therapeutic activities;Patient/family education;Cryotherapy;Functional mobility training;Neuromuscular re-education;Moist Heat;Therapeutic exercise;Balance training    PT Next Visit Plan  Continue endurance, balance, and strengthening exs and activities for pt.       Patient will benefit from skilled therapeutic intervention in order to improve the following deficits and impairments:  Cardiopulmonary status limiting activity, Decreased mobility, Improper body mechanics, Obesity, Impaired flexibility, Increased edema, Decreased activity tolerance, Decreased strength, Difficulty walking, Decreased balance  Visit Diagnosis: Pneumonia due to COVID-19 virus  Muscle weakness (generalized)  Other abnormalities of gait and mobility     Problem List Patient Active Problem List   Diagnosis Date Noted  . Hyperlipidemia associated with type 2 diabetes mellitus ([place]) 02/09/2020  . Type 2 diabetes mellitus, controlled (  [place]) 01/12/2020  . Class 2 severe obesity with serious comorbidity and body mass index (BMI) of 35.0 to 35.9 in adult, unspecified obesity type ([place]) 12/25/2018  . Obstructive sleep apnea 11/04/2017  . Lumbar stenosis with neurogenic claudication 11/19/2016  . HTN (hypertension) 03/21/2016  . Anxiety   .  Depression     Gar Ponto MS, PT 02/17/20 11:05 PM  [facility] [address] [place], [place], [address] Phone: [phone]   Fax:  [phone]  Name: Judy [name] MRN: [id number] Date of Birth: 10/16/1941

## 2020-02-18 ENCOUNTER — Telehealth: Payer: Self-pay | Admitting: Internal Medicine

## 2020-02-18 DIAGNOSIS — G4733 Obstructive sleep apnea (adult) (pediatric): Secondary | ICD-10-CM

## 2020-02-18 NOTE — Telephone Encounter (Signed)
Changing order for sleep study, pls let lab know

## 2020-02-18 NOTE — Telephone Encounter (Signed)
Judy [name] is requesting orders for Cpap be changed to split night 30 as last test was done 20 years ago.  Please call Judy [name] back.

## 2020-02-22 ENCOUNTER — Other Ambulatory Visit: Payer: Self-pay

## 2020-02-22 ENCOUNTER — Ambulatory Visit: Payer: Medicare Other

## 2020-02-22 VITALS — BP 150/62 | HR 90

## 2020-02-22 DIAGNOSIS — M6281 Muscle weakness (generalized): Secondary | ICD-10-CM

## 2020-02-22 DIAGNOSIS — U071 COVID-19: Secondary | ICD-10-CM

## 2020-02-22 DIAGNOSIS — R2689 Other abnormalities of gait and mobility: Secondary | ICD-10-CM

## 2020-02-22 DIAGNOSIS — J1282 Pneumonia due to coronavirus disease 2019: Secondary | ICD-10-CM

## 2020-02-22 NOTE — Therapy (Signed)
[place], [place], [address] Phone: [phone]   Fax:  [phone]  Physical Therapy Treatment  Patient Details  Name: Judy [name] MRN: [id number] Date of Birth: [birth year] Referring Provider (PT): Asencion Noble, MD    Encounter Date: 02/22/2020  PT End of Session - 02/22/20 1500    Visit Number  5    Number of Visits  16    Date for PT Re-Evaluation  03/30/20    PT Start Time  6962    PT Stop Time  1447    PT Time Calculation (min)  44 min    Equipment Utilized During Treatment  Other (comment)   RW   Activity Tolerance  Patient tolerated treatment well    Behavior During Therapy  [facility] for tasks assessed/performed       Past Medical History:  Diagnosis Date  . Anxiety   . Back pain   . Complication of anesthesia 2003   gallbladder surgery-resp. arrest-stop operation and pt. went to ICU - patient stated she has back surgery since then and had no trouble at all  . Depression   . Diabetes ([place])   . Dyspnea   . Gout   . History of kidney stones   . Hypertension   . Leg edema   . Pancreatitis, chronic ([place]) 03/2016  . Pneumonia due to COVID-19 virus 12/03/2019  . Sleep apnea     Past Surgical History:  Procedure Laterality Date  . BACK SURGERY    . BREAST LUMPECTOMY WITH RADIOACTIVE SEED LOCALIZATION Right 10/23/2019   Procedure: RIGHT BREAST LUMPECTOMY WITH RADIOACTIVE SEED LOCALIZATION;  Surgeon: Jovita Kussmaul, MD;  Location: [facility];  Service: General;  Laterality: Right;  . CHOLECYSTECTOMY    . COLONOSCOPY  2013    Vitals:   02/22/20 1459  BP: (!) 150/62  Pulse: 90  SpO2: 97%    Subjective Assessment - 02/22/20 1408    Subjective  Pt reports she has been completing her HEP for strength and balance. Pt states her tolerance to activity is improving.                       [place] Adult PT Treatment/Exercise - 02/22/20 0001      Ambulation/Gait   Assistive  device  Rolling walker    Gait Pattern  --   Pt continues to walk with an improved pace.     High Level Balance   High Level Balance Activities  Side stepping   over low hurdles; 33f x 8   High Level Balance Comments  SIngle leg stands and tandem standing; 3 reps each LE, 20 sec hold.      Lumbar Exercises: Aerobic   Nustep  10 mins; Level 3; post 10=O2 sat% 96%,, HR 98; post 10 mins, O2 sat 97%, HR 92.      Lumbar Exercises: Seated   Other Seated Lumbar Exercises  Hamstring stretch c strap 2x; 20 sec      Knee/Hip Exercises: Stretches   Other Knee/Hip Stretches  KTC; piriformis stretches 2x, 20 sec for each ex    Other Knee/Hip Stretches  trunk rotation 10x each direction      Knee/Hip Exercises: Standing   Other Standing Knee Exercises  Standing exs at parrallel bars: ankle rocks, leg curls, back kicks, side kicks, knee lifts, mini squats 15x eack ex.             PT Education -  02/22/20 1457    Education Details  low back and LE stretching exs were added today    Person(s) Educated  Patient    Methods  Explanation;Demonstration;Tactile cues;Verbal cues;Handout    Comprehension  Verbalized understanding;Returned demonstration;Verbal cues required;Tactile cues required;Need further instruction       PT Short Term Goals - 02/22/20 1618      PT SHORT TERM GOAL #1   Title  Met: Berg balance and TUG have been completed    Baseline  Not completed on eval    Time  2    Period  Weeks    Target Date  02/17/20        PT Long Term Goals - 02/22/20 1620      PT LONG TERM GOAL #5   Title  Improve Berg balance score to 46/56 for improved balance c functional mobility    Baseline  38/56    Time  5    Period  Weeks    Status  New    Target Date  03/30/20      Additional Long Term Goals   Additional Long Term Goals  Yes      PT LONG TERM GOAL #6   Title  Improved TUG to less than 12 sec c a RW to reflect improved functional mobility.    Baseline  13.6 sec    Time  5     Period  Weeks    Status  New    Target Date  03/30/20            Plan - 02/22/20 1503    Clinical Impression Statement  PT session focused on developing low back and LE flexibility ex program. A written HEP was provided. PT was continued to address pt's activity tolerance, balance and functional mobility.    PT Treatment/Interventions  ADLs/Self Care Home Management;Therapeutic activities;Patient/family education;Cryotherapy;Functional mobility training;Neuromuscular re-education;Moist Heat;Therapeutic exercise;Balance training    PT Next Visit Plan  Continued PT to address strength, balance, and endurance to improve pt's functional mobility.    PT Home Exercise Plan  [id number]. Seated hamstring stretch, supine KTC, supine piriformis stretch, supine lower trunk rotation       Patient will benefit from skilled therapeutic intervention in order to improve the following deficits and impairments:  Cardiopulmonary status limiting activity, Decreased mobility, Improper body mechanics, Obesity, Impaired flexibility, Increased edema, Decreased activity tolerance, Decreased strength, Difficulty walking, Decreased balance  Visit Diagnosis: Pneumonia due to COVID-19 virus  Muscle weakness (generalized)  Other abnormalities of gait and mobility  Clinical diagnosis of COVID-19     Problem List Patient Active Problem List   Diagnosis Date Noted  . Hyperlipidemia associated with type 2 diabetes mellitus ([place]) 02/09/2020  . Type 2 diabetes mellitus, controlled ([place]) 01/12/2020  . Class 2 severe obesity with serious comorbidity and body mass index (BMI) of 35.0 to 35.9 in adult, unspecified obesity type ([place]) 12/25/2018  . Obstructive sleep apnea 11/04/2017  . Lumbar stenosis with neurogenic claudication 11/19/2016  . HTN (hypertension) 03/21/2016  . Anxiety   . Depression     Judy Ponto MS, PT 02/22/20 4:32 PM  [facility] [address] [place], [place], [address] Phone: [phone]   Fax:  [phone]  Name: Judy [name] MRN: [id number] Date of Birth: 1941/08/01

## 2020-02-23 ENCOUNTER — Telehealth: Payer: Self-pay

## 2020-02-23 NOTE — Telephone Encounter (Signed)
Call placed to [facility], spoke to Conestee who said that they received the order for the split night study and she will contact the patient to schedule.

## 2020-02-24 ENCOUNTER — Ambulatory Visit: Payer: Medicare Other

## 2020-02-29 ENCOUNTER — Emergency Department (HOSPITAL_COMMUNITY): Payer: Medicare Other

## 2020-02-29 ENCOUNTER — Telehealth: Payer: Self-pay

## 2020-02-29 ENCOUNTER — Ambulatory Visit: Payer: Medicare Other

## 2020-02-29 ENCOUNTER — Observation Stay (HOSPITAL_COMMUNITY): Payer: Medicare Other

## 2020-02-29 ENCOUNTER — Inpatient Hospital Stay (HOSPITAL_COMMUNITY)
Admission: EM | Admit: 2020-02-29 | Discharge: 2020-03-08 | DRG: 233 | Disposition: A | Discharge status: Home / Self Care | Payer: Medicare Other | Attending: Thoracic Surgery (Cardiothoracic Vascular Surgery) | Admitting: Thoracic Surgery (Cardiothoracic Vascular Surgery)

## 2020-02-29 ENCOUNTER — Other Ambulatory Visit: Payer: Self-pay

## 2020-02-29 DIAGNOSIS — I97820 Postprocedural cerebrovascular infarction during cardiac surgery: Secondary | ICD-10-CM | POA: Diagnosis not present

## 2020-02-29 DIAGNOSIS — I214 Non-ST elevation (NSTEMI) myocardial infarction: Principal | ICD-10-CM | POA: Diagnosis present

## 2020-02-29 DIAGNOSIS — E119 Type 2 diabetes mellitus without complications: Secondary | ICD-10-CM

## 2020-02-29 DIAGNOSIS — I63441 Cerebral infarction due to embolism of right cerebellar artery: Secondary | ICD-10-CM | POA: Diagnosis not present

## 2020-02-29 DIAGNOSIS — R4701 Aphasia: Secondary | ICD-10-CM | POA: Diagnosis not present

## 2020-02-29 DIAGNOSIS — Z87442 Personal history of urinary calculi: Secondary | ICD-10-CM

## 2020-02-29 DIAGNOSIS — E785 Hyperlipidemia, unspecified: Secondary | ICD-10-CM | POA: Diagnosis present

## 2020-02-29 DIAGNOSIS — D62 Acute posthemorrhagic anemia: Secondary | ICD-10-CM | POA: Diagnosis not present

## 2020-02-29 DIAGNOSIS — R079 Chest pain, unspecified: Secondary | ICD-10-CM

## 2020-02-29 DIAGNOSIS — I10 Essential (primary) hypertension: Secondary | ICD-10-CM | POA: Diagnosis present

## 2020-02-29 DIAGNOSIS — R4182 Altered mental status, unspecified: Secondary | ICD-10-CM | POA: Diagnosis present

## 2020-02-29 DIAGNOSIS — E1169 Type 2 diabetes mellitus with other specified complication: Secondary | ICD-10-CM | POA: Diagnosis present

## 2020-02-29 DIAGNOSIS — R4702 Dysphasia: Secondary | ICD-10-CM | POA: Diagnosis not present

## 2020-02-29 DIAGNOSIS — R29810 Facial weakness: Secondary | ICD-10-CM | POA: Diagnosis not present

## 2020-02-29 DIAGNOSIS — R29702 NIHSS score 2: Secondary | ICD-10-CM | POA: Diagnosis not present

## 2020-02-29 DIAGNOSIS — Z951 Presence of aortocoronary bypass graft: Secondary | ICD-10-CM

## 2020-02-29 DIAGNOSIS — I25110 Atherosclerotic heart disease of native coronary artery with unstable angina pectoris: Secondary | ICD-10-CM | POA: Diagnosis present

## 2020-02-29 DIAGNOSIS — J9811 Atelectasis: Secondary | ICD-10-CM | POA: Diagnosis present

## 2020-02-29 DIAGNOSIS — D72829 Elevated white blood cell count, unspecified: Secondary | ICD-10-CM | POA: Diagnosis present

## 2020-02-29 DIAGNOSIS — Z6837 Body mass index (BMI) 37.0-37.9, adult: Secondary | ICD-10-CM

## 2020-02-29 DIAGNOSIS — Z8616 Personal history of COVID-19: Secondary | ICD-10-CM

## 2020-02-29 DIAGNOSIS — Y840 Cardiac catheterization as the cause of abnormal reaction of the patient, or of later complication, without mention of misadventure at the time of the procedure: Secondary | ICD-10-CM | POA: Diagnosis not present

## 2020-02-29 DIAGNOSIS — Z7984 Long term (current) use of oral hypoglycemic drugs: Secondary | ICD-10-CM

## 2020-02-29 DIAGNOSIS — Z79899 Other long term (current) drug therapy: Secondary | ICD-10-CM

## 2020-02-29 DIAGNOSIS — E669 Obesity, unspecified: Secondary | ICD-10-CM | POA: Diagnosis present

## 2020-02-29 DIAGNOSIS — I6330 Cerebral infarction due to thrombosis of unspecified cerebral artery: Secondary | ICD-10-CM | POA: Insufficient documentation

## 2020-02-29 DIAGNOSIS — F329 Major depressive disorder, single episode, unspecified: Secondary | ICD-10-CM | POA: Diagnosis present

## 2020-02-29 DIAGNOSIS — F419 Anxiety disorder, unspecified: Secondary | ICD-10-CM | POA: Diagnosis present

## 2020-02-29 DIAGNOSIS — G4733 Obstructive sleep apnea (adult) (pediatric): Secondary | ICD-10-CM | POA: Diagnosis present

## 2020-02-29 DIAGNOSIS — Z09 Encounter for follow-up examination after completed treatment for conditions other than malignant neoplasm: Secondary | ICD-10-CM

## 2020-02-29 LAB — CBC WITH DIFFERENTIAL/PLATELET
Abs Immature Granulocytes: 0.03 10*3/uL (ref 0.00–0.07)
Basophils Absolute: 0 10*3/uL (ref 0.0–0.1)
Basophils Relative: 0 %
Eosinophils Absolute: 0.1 10*3/uL (ref 0.0–0.5)
Eosinophils Relative: 1 %
HCT: 43.9 % (ref 36.0–46.0)
Hemoglobin: 14 g/dL (ref 12.0–15.0)
Immature Granulocytes: 0 %
Lymphocytes Relative: 13 %
Lymphs Abs: 1.2 10*3/uL (ref 0.7–4.0)
MCH: 29.9 pg (ref 26.0–34.0)
MCHC: 31.9 g/dL (ref 30.0–36.0)
MCV: 93.8 fL (ref 80.0–100.0)
Monocytes Absolute: 0.3 10*3/uL (ref 0.1–1.0)
Monocytes Relative: 3 %
Neutro Abs: 7.8 10*3/uL — ABNORMAL HIGH (ref 1.7–7.7)
Neutrophils Relative %: 83 %
Platelets: 285 10*3/uL (ref 150–400)
RBC: 4.68 MIL/uL (ref 3.87–5.11)
RDW: 13.2 % (ref 11.5–15.5)
WBC: 9.4 10*3/uL (ref 4.0–10.5)
nRBC: 0 % (ref 0.0–0.2)

## 2020-02-29 LAB — COMPREHENSIVE METABOLIC PANEL
ALT: 23 U/L (ref 0–44)
AST: 20 U/L (ref 15–41)
Albumin: 4.1 g/dL (ref 3.5–5.0)
Alkaline Phosphatase: 75 U/L (ref 38–126)
Anion gap: 13 (ref 5–15)
BUN: 18 mg/dL (ref 8–23)
CO2: 26 mmol/L (ref 22–32)
Calcium: 10 mg/dL (ref 8.9–10.3)
Chloride: 100 mmol/L (ref 98–111)
Creatinine, Ser: 1.18 mg/dL — ABNORMAL HIGH (ref 0.44–1.00)
GFR calc Af Amer: 52 mL/min — ABNORMAL LOW (ref >60)
GFR calc non Af Amer: 44 mL/min — ABNORMAL LOW (ref >60)
Glucose, Bld: 144 mg/dL — ABNORMAL HIGH (ref 70–99)
Potassium: 4.3 mmol/L (ref 3.5–5.1)
Sodium: 139 mmol/L (ref 135–145)
Total Bilirubin: 0.5 mg/dL (ref 0.3–1.2)
Total Protein: 7.1 g/dL (ref 6.5–8.1)

## 2020-02-29 LAB — HEPARIN LEVEL (UNFRACTIONATED): Heparin Unfractionated: 0.12 IU/mL — ABNORMAL LOW (ref 0.30–0.70)

## 2020-02-29 LAB — D-DIMER, QUANTITATIVE: D-Dimer, Quant: 0.57 ug/mL-FEU — ABNORMAL HIGH (ref 0.00–0.50)

## 2020-02-29 LAB — ECHOCARDIOGRAM COMPLETE
Height: 60 in
Weight: 2864 oz

## 2020-02-29 LAB — LIPASE, BLOOD: Lipase: 28 U/L (ref 11–51)

## 2020-02-29 LAB — TROPONIN I (HIGH SENSITIVITY)
Troponin I (High Sensitivity): 61 ng/L — ABNORMAL HIGH (ref <18)
Troponin I (High Sensitivity): 302 ng/L (ref <18)
Troponin I (High Sensitivity): 2127 ng/L (ref <18)

## 2020-02-29 LAB — SARS CORONAVIRUS 2 (TAT 6-24 HRS): SARS Coronavirus 2: NEGATIVE

## 2020-02-29 LAB — GLUCOSE, CAPILLARY: Glucose-Capillary: 117 mg/dL — ABNORMAL HIGH (ref 70–99)

## 2020-02-29 MED ORDER — METOPROLOL TARTRATE 12.5 MG HALF TABLET
12.5000 mg | ORAL_TABLET | Freq: Two times a day (BID) | ORAL | Status: DC
  Administered 2020-02-29 – 2020-03-02 (×4): 12.5 mg via ORAL
  Filled 2020-02-29 (×5): qty 1

## 2020-02-29 MED ORDER — HEPARIN BOLUS VIA INFUSION
1900.0000 [IU] | Freq: Once | INTRAVENOUS | Status: AC
  Administered 2020-02-29: 1900 [IU] via INTRAVENOUS
  Filled 2020-02-29: qty 1900

## 2020-02-29 MED ORDER — HEPARIN BOLUS VIA INFUSION
3800.0000 [IU] | Freq: Once | INTRAVENOUS | Status: AC
  Administered 2020-02-29: 12:00:00 3800 [IU] via INTRAVENOUS
  Filled 2020-02-29: qty 3800

## 2020-02-29 MED ORDER — ASPIRIN 81 MG PO CHEW: 324.0000 mg | CHEWABLE_TABLET | ORAL | Status: AC

## 2020-02-29 MED ORDER — ATORVASTATIN CALCIUM 80 MG PO TABS
80.0000 mg | ORAL_TABLET | Freq: Every day | ORAL | Status: DC
  Administered 2020-02-29 – 2020-03-07 (×8): 80 mg via ORAL
  Filled 2020-02-29 (×8): qty 1

## 2020-02-29 MED ORDER — ONDANSETRON HCL 4 MG/2ML IJ SOLN
4.0000 mg | Freq: Four times a day (QID) | INTRAMUSCULAR | Status: DC | PRN
  Administered 2020-03-01 (×2): 4 mg via INTRAVENOUS
  Filled 2020-02-29 (×2): qty 2

## 2020-02-29 MED ORDER — HEPARIN (PORCINE) 25000 UT/250ML-% IV SOLN
1250.0000 [IU]/h | INTRAVENOUS | Status: DC
  Administered 2020-02-29: 12:00:00 800 [IU]/h via INTRAVENOUS
  Administered 2020-03-01: 07:00:00 1050 [IU]/h via INTRAVENOUS
  Filled 2020-02-29 (×2): qty 250

## 2020-02-29 MED ORDER — ASPIRIN 300 MG RE SUPP: 300.0000 mg | RECTAL | Status: AC

## 2020-02-29 MED ORDER — IOHEXOL 350 MG/ML SOLN
75.0000 mL | Freq: Once | INTRAVENOUS | Status: AC | PRN
  Administered 2020-02-29: 75 mL via INTRAVENOUS

## 2020-02-29 MED ORDER — ACETAMINOPHEN 325 MG PO TABS
650.0000 mg | ORAL_TABLET | ORAL | Status: DC | PRN
  Administered 2020-03-01 (×2): 650 mg via ORAL
  Filled 2020-02-29 (×2): qty 2

## 2020-02-29 MED ORDER — ASPIRIN EC 81 MG PO TBEC
81.0000 mg | DELAYED_RELEASE_TABLET | Freq: Every day | ORAL | Status: DC
  Administered 2020-03-02: 81 mg via ORAL
  Filled 2020-02-29: qty 1

## 2020-02-29 MED ORDER — NITROGLYCERIN IN D5W 200-5 MCG/ML-% IV SOLN
0.0000 ug/min | INTRAVENOUS | Status: DC
  Administered 2020-02-29: 5 ug/min via INTRAVENOUS
  Administered 2020-03-01: 01:00:00 40 ug/min via INTRAVENOUS
  Administered 2020-03-01: 35 ug/min via INTRAVENOUS
  Filled 2020-02-29: qty 250

## 2020-02-29 MED ORDER — SODIUM CHLORIDE 0.9 % IV BOLUS
500.0000 mL | Freq: Once | INTRAVENOUS | Status: AC
  Administered 2020-02-29: 500 mL via INTRAVENOUS

## 2020-02-29 NOTE — Telephone Encounter (Signed)
Noted. Patient in ED and being admitted  [place]

## 2020-02-29 NOTE — ED Provider Notes (Signed)
[facility] EMERGENCY DEPARTMENT Provider Note   CSN: [id number] Arrival date & time: 02/29/20  1011     History Chief Complaint  Patient presents with  . Chest Pain    Judy [name] is a 79 y.o. female with a past medical history significant for chronic pancreatitis, depression, diabetes, and hypertension who presents to the ED via EMS due to central, sharp chest pain that started last night around 10 PM and has progressively gotten worse.  Patient notes it woke her from her sleep around 7:15 AM this morning.  Chest pain radiates underneath her right breast.  No relationship to exertion.  Chest pain is constant in nature, but sometimes worse with deep inspiration. Chest pain associated with shortness of breath, diaphoresis, and nausea.  Patient denies recent illness, fever, abdominal pain, vomiting, and diarrhea.  Patient recently was treated for COVID complicated by pneumonia on 12/31.  Denies history of blood clots, recent surgeries, recent long immobilizations, hormonal treatments, and lower extremity edema.  She admits to extensive cardiac history on her paternal side with early CAD.  Denies history of MI or stroke.  Admits to previous chest pain which was diagnosed as chronic pancreatitis.  Denies tobacco, alcohol, drug abuse. Patient admits to current chest pain, but improvement after ASA.  History obtained from patient and past medical records. No interpreter used during encounter.      Past Medical History:  Diagnosis Date  . Anxiety   . Back pain   . Complication of anesthesia 2003   gallbladder surgery-resp. arrest-stop operation and pt. went to ICU - patient stated she has back surgery since then and had no trouble at all  . Depression   . Diabetes ([place])   . Dyspnea   . Gout   . History of kidney stones   . Hypertension   . Leg edema   . Pancreatitis, chronic ([place]) 03/2016  . Pneumonia due to COVID-19 virus 12/03/2019  . Sleep apnea     Patient  Active Problem List   Diagnosis Date Noted  . Hyperlipidemia associated with type 2 diabetes mellitus ([place]) 02/09/2020  . Type 2 diabetes mellitus, controlled ([place]) 01/12/2020  . Class 2 severe obesity with serious comorbidity and body mass index (BMI) of 35.0 to 35.9 in adult, unspecified obesity type ([place]) 12/25/2018  . Obstructive sleep apnea 11/04/2017  . Lumbar stenosis with neurogenic claudication 11/19/2016  . HTN (hypertension) 03/21/2016  . Anxiety   . Depression     Past Surgical History:  Procedure Laterality Date  . BACK SURGERY    . BREAST LUMPECTOMY WITH RADIOACTIVE SEED LOCALIZATION Right 10/23/2019   Procedure: RIGHT BREAST LUMPECTOMY WITH RADIOACTIVE SEED LOCALIZATION;  Surgeon: Jovita Kussmaul, MD;  Location: [facility];  Service: General;  Laterality: Right;  . CHOLECYSTECTOMY    . COLONOSCOPY  2013     OB History    Gravida  1   Para      Term      Preterm      AB      Living  1     SAB      TAB      Ectopic      Multiple      Live Births              Family History  Problem Relation Age of Onset  . Dementia Mother   . AAA (abdominal aortic aneurysm) Father   . Breast cancer Maternal Aunt  Social History   Tobacco Use  . Smoking status: Never Smoker  . Smokeless tobacco: Never Used  Substance Use Topics  . Alcohol use: No    Alcohol/week: 0.0 standard drinks  . Drug use: No    Home Medications Prior to Admission medications   Medication Sig Start Date End Date Taking? Authorizing Provider  atorvastatin (LIPITOR) 20 MG tablet Take 20 mg by mouth daily. 02/02/20   [provider]  clonazePAM (KLONOPIN) 0.5 MG tablet Take 1 tablet (0.5 mg total) by mouth at bedtime. 01/12/20   Elsie Stain, MD  diltiazem (CARDIZEM CD) 240 MG 24 hr capsule Take 1 capsule (240 mg total) by mouth daily. 01/12/20   Elsie Stain, MD  empagliflozin (JARDIANCE) 10 MG TABS tablet Take 10 mg by mouth daily. 01/12/20   Elsie Stain, MD   etodolac (LODINE) 400 MG tablet Take 1 tablet (400 mg total) by mouth daily as needed for moderate pain. Patient not taking: Reported on 02/03/2020 01/12/20   Elsie Stain, MD  furosemide (LASIX) 20 MG tablet Take 1 tablet (20 mg total) by mouth daily as needed for edema. 01/12/20 01/11/21  Elsie Stain, MD  glucose blood (ACCU-CHEK AVIVA) test strip Test twice daily 01/13/20   Elsie Stain, MD  imipramine (TOFRANIL) 50 MG tablet Take 1 tablet (50 mg total) by mouth at bedtime. 01/12/20   Elsie Stain, MD  Lancets (ACCU-CHEK SAFE-T PRO) lancets Test twice daily 12/25/19   Elsie Stain, MD  metFORMIN (GLUCOPHAGE) 1000 MG tablet Take 1 tablet (1,000 mg total) by mouth 2 (two) times daily with a meal. 01/12/20   Elsie Stain, MD  NON FORMULARY 1 each by Other route See admin instructions. CPAP machine nightly    [provider]  nystatin cream (MYCOSTATIN)  02/02/20   [provider]    Allergies    Patient has no known allergies.  Review of Systems   Review of Systems  Constitutional: Negative for chills and fever.  Respiratory: Positive for shortness of breath. Negative for cough.   Cardiovascular: Positive for chest pain. Negative for leg swelling.  Gastrointestinal: Positive for nausea. Negative for abdominal pain, diarrhea and vomiting.  Genitourinary: Negative for dysuria.  Musculoskeletal: Negative for back pain.  All other systems reviewed and are negative.   Physical Exam Updated Vital Signs BP (!) 98/51 (BP Location: Right Arm)   Pulse 74   Temp 97.9 F (36.6 C) (Oral)   Resp 18   Ht 5' (1.524 m)   Wt 81.2 kg   SpO2 96%   BMI 34.96 kg/m   Physical Exam Vitals and nursing note reviewed.  Constitutional:      General: She is not in acute distress.    Appearance: She is ill-appearing.  HENT:     Head: Normocephalic.  Eyes:     Pupils: Pupils are equal, round, and reactive to light.  Cardiovascular:     Rate and Rhythm: Normal rate  and regular rhythm.     Pulses: Normal pulses.     Heart sounds: Normal heart sounds. No murmur. No friction rub. No gallop.   Pulmonary:     Effort: Pulmonary effort is normal.     Breath sounds: Normal breath sounds.     Comments: Respirations equal and unlabored, patient able to speak in full sentences, lungs clear to auscultation bilaterally  Chest:     Comments: Anterior chest wall tenderness.  No crepitus or deformity. Abdominal:  General: Abdomen is flat. Bowel sounds are normal. There is no distension.     Palpations: Abdomen is soft.     Tenderness: There is abdominal tenderness. There is no guarding or rebound.     Comments: Right upper quadrant and epigastric tenderness.  No rebound or guarding.  Musculoskeletal:     Cervical back: Neck supple.     Comments: No lower extremity edema.  Negative Homans sign bilaterally.  Skin:    General: Skin is warm and dry.  Neurological:     General: No focal deficit present.     Mental Status: She is alert.  Psychiatric:        Mood and Affect: Mood normal.        Behavior: Behavior normal.       ED Results / Procedures / Treatments   Labs (all labs ordered are listed, but only abnormal results are displayed) Labs Reviewed - No data to display  EKG None  Radiology No results found.  Procedures .Critical Care Performed by: Suzy Bouchard, PA-C Authorized by: Suzy Bouchard, PA-C   Critical care provider statement:    Critical care time (minutes):  45   Critical care time was exclusive of:  Separately billable procedures and treating other patients and teaching time   Critical care was necessary to treat or prevent imminent or life-threatening deterioration of the following conditions:  Cardiac failure   Critical care was time spent personally by me on the following activities:  Discussions with consultants, evaluation of patient's response to treatment, examination of patient, ordering and performing  treatments and interventions, ordering and review of laboratory studies, ordering and review of radiographic studies, pulse oximetry, re-evaluation of patient's condition, obtaining history from patient or surrogate, review of old charts, development of treatment plan with patient or surrogate and blood draw for specimens   I assumed direction of critical care for this patient from another provider in my specialty: no     (including critical care time)  Medications Ordered in ED Medications - No data to display  ED Course  I have reviewed the triage vital signs and the nursing notes.  Pertinent labs & imaging results that were available during my care of the patient were reviewed by me and considered in my medical decision making (see chart for details).  Clinical Course as of Feb 29 1200  Mon Feb 29, 2020  1040 BP(!): 98/51 [CA]  1053 Spoke to cardiology on EKG read who notes they will come down to evaluate patient for further treatment.    [CA]  1157 Troponin I (High Sensitivity)(!): 61 [CA]    Clinical Course User Index [CA] Suzy Bouchard, PA-C   MDM Rules/Calculators/A&P                     79 year old female presents to the ED via EMS due to constant sharp chest pain that started last night 10 PM that is progressively gotten worse.  Upon arrival, patient afebrile, not tachycardic or hypoxic.  Soft BP at 98/51.  Patient's story concerning for ACS with mild EKG changes both with ambulance EKG and EKG obtained here.  Patient already given nitro and ASA in route.  Discussed EKG with cardiology.  See note above.  Patient started on heparin and nitro drip.  Routine labs and troponin obtained. Discussed case with Dr. Tyrone Nine who evaluated patient at bedside and agrees with assessment and plan.   CBC reassuring with no leukocytosis. D-dimer elevated at  at 0.57; however, age-adjusted d-dimer makes PE/DVT unlikely. CTA not warranted at this time. Initial troponin 61. Will obtain delta.  Lipase normal at 28. Doubt pancreatitis. Patient has a past cholecystectomy. Doubt gallbladder etiology. CMP reassuring with mild AKI with creatinine at 1.18. CXR personally reviewed which demonstrates: IMPRESSION:  Mild left base atelectasis. Lungs elsewhere clear. Stable cardiac  prominence. Aortic Atherosclerosis (ICD10-I70.0). There is also left  carotid artery calcification.   2nd troponin 302. Cardiology agrees to admit patient for further evaluation and treatment. COVID order placed per cardiology request.  Final Clinical Impression(s) / ED Diagnoses Final diagnoses:  None    Rx / DC Orders ED Discharge Orders    None       Suzy Bouchard, PA-C 02/29/20 [place], [place], DO 02/29/20 1517

## 2020-02-29 NOTE — Telephone Encounter (Signed)
FYI  A friend of the patient contacted the office to let Dr. Joya Gaskins know that pt is currently in the office

## 2020-02-29 NOTE — H&P (Signed)
Cardiology Admission History and Physical:   Patient ID: Judy [name] MRN: [id number]; DOB: 15-Apr-1941   Admission date: 02/29/2020  Primary Care Provider: Michael Boston, MD Primary Cardiologist:  New to [facility] Primary Electrophysiologist:  None   Chief Complaint:  Chest pain  Patient Profile:   Judy [name] is a 79 y.o. female with past medical history of obstructive sleep apnea on CPAP, COVID-19 in 11/2019, chronic pancreatitis, HTN, HLD and DM2 presented with chest pain, mild EKG changes in inferior leads was noted.   History of Present Illness:   Judy [name] is a pleasant 79 year old female with past medical history of OSA on CPAP, COVID-19 in 11/2019, chronic pancreatitis, HTN, HLD and DM2.  Previous echocardiogram obtained on 03/23/2016 showed EF 60 to 65%, grade 1 DD.  She was in her usual state of health until the morning of 02/28/2020.  She woke up feeling ill.  She went to the church and by the time she came back, she continued to feel bad for the rest of the day.  She had nausea but no vomiting.  Around 10 PM on the night of 3/28, she started having substernal chest pressure radiating to the right chest and also the right shoulder.  She also admits to have some shortness of breath and sweating.  She tries to go to sleep however woke up multiple times with persistent chest pain.  She says the chest pain became intolerable around 7 AM in the morning of 3/29, this prompted the patient to seek urgent medical attention.  EKG during EMS transport was concerning for inferior STEMI.  The EKG was reviewed by our STEMI on-call physician who felt it did not meet the criteria at the time.  After she arrived, she was placed on IV heparin and IV nitroglycerin.  This significantly improved her symptoms.  Initial lab work was significant for creatinine of 1.18, high-sensitivity troponin 61, normal hemoglobin.  EKG continue to show minimal J-point elevation in the inferior lead with minimal ST depression  in lead I and aVL.  A repeat EKG was obtained around 12:04 PM, J-point elevation has resolved in the inferior lead.  After she was placed on IV nitro, her chest pain has improved to 4 out of 10.  She does mention, she feels her chest pain is slightly more when she laying her body to the left side and during palpation.  She is fairly sedentary at home and does not do much strenuous activity.   Past Medical History:  Diagnosis Date  . Anxiety   . Back pain   . Complication of anesthesia 2003   gallbladder surgery-resp. arrest-stop operation and pt. went to ICU - patient stated she has back surgery since then and had no trouble at all  . Depression   . Diabetes ([place])   . Dyspnea   . Gout   . History of kidney stones   . Hypertension   . Leg edema   . Pancreatitis, chronic ([place]) 03/2016  . Pneumonia due to COVID-19 virus 12/03/2019  . Sleep apnea     Past Surgical History:  Procedure Laterality Date  . BACK SURGERY    . BREAST LUMPECTOMY WITH RADIOACTIVE SEED LOCALIZATION Right 10/23/2019   Procedure: RIGHT BREAST LUMPECTOMY WITH RADIOACTIVE SEED LOCALIZATION;  Surgeon: Jovita Kussmaul, MD;  Location: [facility];  Service: General;  Laterality: Right;  . CHOLECYSTECTOMY    . COLONOSCOPY  2013     Medications Prior to Admission: Prior to Admission medications  Medication Sig Start Date End Date Taking? Authorizing Provider  atorvastatin (LIPITOR) 20 MG tablet Take 20 mg by mouth daily. 02/02/20   [provider]  clonazePAM (KLONOPIN) 0.5 MG tablet Take 1 tablet (0.5 mg total) by mouth at bedtime. 01/12/20   Elsie Stain, MD  diltiazem (CARDIZEM CD) 240 MG 24 hr capsule Take 1 capsule (240 mg total) by mouth daily. 01/12/20   Elsie Stain, MD  empagliflozin (JARDIANCE) 10 MG TABS tablet Take 10 mg by mouth daily. 01/12/20   Elsie Stain, MD  etodolac (LODINE) 400 MG tablet Take 1 tablet (400 mg total) by mouth daily as needed for moderate pain. Patient not taking: Reported  on 02/03/2020 01/12/20   Elsie Stain, MD  furosemide (LASIX) 20 MG tablet Take 1 tablet (20 mg total) by mouth daily as needed for edema. 01/12/20 01/11/21  Elsie Stain, MD  glucose blood (ACCU-CHEK AVIVA) test strip Test twice daily 01/13/20   Elsie Stain, MD  imipramine (TOFRANIL) 50 MG tablet Take 1 tablet (50 mg total) by mouth at bedtime. 01/12/20   Elsie Stain, MD  Lancets (ACCU-CHEK SAFE-T PRO) lancets Test twice daily 12/25/19   Elsie Stain, MD  metFORMIN (GLUCOPHAGE) 1000 MG tablet Take 1 tablet (1,000 mg total) by mouth 2 (two) times daily with a meal. 01/12/20   Elsie Stain, MD  NON FORMULARY 1 each by Other route See admin instructions. CPAP machine nightly    [provider]  nystatin cream (MYCOSTATIN)  02/02/20   [provider]     Allergies:   No Known Allergies  Social History:   Social History   Socioeconomic History  . Marital status: Divorced    Spouse name: Not on file  . Number of children: Not on file  . Years of education: Not on file  . Highest education level: Not on file  Occupational History  . Occupation: CNA  Tobacco Use  . Smoking status: Never Smoker  . Smokeless tobacco: Never Used  Substance and Sexual Activity  . Alcohol use: No    Alcohol/week: 0.0 standard drinks  . Drug use: No  . Sexual activity: Not Currently  Other Topics Concern  . Not on file  Social History Narrative  . Not on file   Social Determinants of Health   Financial Resource Strain:   . Difficulty of Paying Living Expenses:   Food Insecurity:   . Worried About Charity fundraiser in the Last Year:   . Arboriculturist in the Last Year:   Transportation Needs:   . Film/video editor (Medical):   [name] Lack of Transportation (Non-Medical):   Physical Activity:   . Days of Exercise per Week:   . Minutes of Exercise per Session:   Stress:   . Feeling of Stress :   Social Connections:   . Frequency of Communication with Friends  and Family:   . Frequency of Social Gatherings with Friends and Family:   . Attends Religious Services:   . Active Member of Clubs or Organizations:   . Attends Archivist Meetings:   [name] Marital Status:   Intimate Partner Violence:   . Fear of Current or Ex-Partner:   . Emotionally Abused:   [name] Physically Abused:   . Sexually Abused:     Family History:   The patient's family history includes AAA (abdominal aortic aneurysm) in her father; Breast cancer in her maternal aunt; Dementia  in her mother.    ROS:  Please see the history of present illness.  All other ROS reviewed and negative.     Physical Exam/Data:   Vitals:   02/29/20 1016 02/29/20 1017 02/29/20 1030 02/29/20 1045  BP: (!) 98/51  124/67 135/64  Pulse: 74  (!) 54 60  Resp: 18  18 14   Temp: 97.9 F (36.6 C)     TempSrc: Oral     SpO2: 96%  96% 98%  Weight:  81.2 kg    Height:  5' (1.524 m)      Intake/Output Summary (Last 24 hours) at 02/29/2020 1201 Last data filed at 02/29/2020 1155 Gross per 24 hour  Intake 500 ml  Output --  Net 500 ml   Last 3 Weights 02/29/2020 02/09/2020 01/12/2020  Weight (lbs) 179 lb 184 lb 187 lb  Weight (kg) 81.194 kg 83.462 kg 84.823 kg     Body mass index is 34.96 kg/m.  General:  Well nourished, well developed, in no acute distress HEENT: normal Lymph: no adenopathy Neck: no JVD Endocrine:  No thryomegaly Vascular: No carotid bruits; FA pulses 2+ bilaterally without bruits  Cardiac:  normal S1, S2; RRR; no murmur  Lungs:  clear to auscultation bilaterally, no wheezing, rhonchi or rales  Abd: soft, nontender, no hepatomegaly  Ext: no edema Musculoskeletal:  No deformities, BUE and BLE strength normal and equal Skin: warm and dry  Neuro:  CNs 2-12 intact, no focal abnormalities noted Psych:  Normal affect    EKG:  The ECG that was done and was personally reviewed and demonstrates normal sinus rhythm with J-point elevation in lead III and aVF with ST depression in  lead I and aVL.  On repeat EKG obtained around 12:04 PM on 3/29, J-point elevation in the inferior lead has resolved.  Relevant CV Studies:  Echo 03/23/2016 LV EF: 60% -  65%   Study Conclusions   - Left ventricle: The cavity size was normal. Wall thickness was  normal. Systolic function was normal. The estimated ejection  fraction was in the range of 60% to 65%. Wall motion was normal;  there were no regional wall motion abnormalities. Doppler  parameters are consistent with abnormal left ventricular  relaxation (grade 1 diastolic dysfunction).  - Aortic valve: There was no stenosis.  - Mitral valve: There was no significant regurgitation.  - Right ventricle: The cavity size was normal. Systolic function  was normal.  - Pulmonary arteries: No complete TR doppler jet so unable to  estimate PA systolic pressure.  - Inferior vena cava: The vessel was normal in size. The  respirophasic diameter changes were in the normal range (>= 50%),  consistent with normal central venous pressure.   Impressions:   - Normal LV size with EF 60-65%. Normal RV size and systolic  function. No significant valvular abnormalities.    Laboratory Data:  High Sensitivity Troponin:   Recent Labs  Lab 02/29/20 1037  TROPONINIHS 61*      Chemistry Recent Labs  Lab 02/29/20 1037  NA 139  K 4.3  CL 100  CO2 26  GLUCOSE 144*  BUN 18  CREATININE 1.18*  CALCIUM 10.0  GFRNONAA 44*  GFRAA 52*  ANIONGAP 13    Recent Labs  Lab 02/29/20 1037  PROT 7.1  ALBUMIN 4.1  AST 20  ALT 23  ALKPHOS 75  BILITOT 0.5   Hematology Recent Labs  Lab 02/29/20 1037  WBC 9.4  RBC 4.68  HGB 14.0  HCT 43.9  MCV 93.8  MCH 29.9  MCHC 31.9  RDW 13.2  PLT 285   BNPNo results for input(s): BNP, PROBNP in the last 168 hours.  DDimer  Recent Labs  Lab 02/29/20 1037  DDIMER 0.57*     Radiology/Studies:  DG Chest Portable 1 View  Result Date: 02/29/2020 CLINICAL DATA:  Chest  pain EXAM: PORTABLE CHEST 1 VIEW COMPARISON:  December 06, 2019 FINDINGS: There is mild left base atelectasis. Lungs elsewhere are clear. Heart is mildly enlarged with pulmonary vascularity normal. No adenopathy. There is degenerative change in the thoracic spine. There is calcification in the left carotid artery. IMPRESSION: Mild left base atelectasis. Lungs elsewhere clear. Stable cardiac prominence. Aortic Atherosclerosis (ICD10-I70.0). There is also left carotid artery calcification. Electronically Signed   By: Lowella Grip III M.D.   On: 02/29/2020 10:47       TIMI Risk Score for Unstable Angina or Non-ST Elevation MI:   The patient's TIMI risk score is  , which indicates a  % risk of all cause mortality, new or recurrent myocardial infarction or need for urgent revascularization in the next 14 days.   Assessment and Plan:   1. NSTEMI:   -Chest pain started around 10 PM last night and has been persistent since then.  The pain was worst around 7 AM this morning.  Initial EKG strip obtained by EMS and first EKG obtained at the ED showed minimal J-point elevation in the inferior lead and ST depression in lead I and aVL.  The EKG was reviewed by our interventional cardiologist who felt it did not meet STEMI criteria at the time.  -Chest pain is located in the substernal area radiating to the right chest.  Slightly worse when she turns her body to the left side or with palpation.  -Initial EKG came back elevated to 61.  Symptom significantly improved with IV nitroglycerin.  Currently 4 out of 10 chest pain.  Repeat EKG showed improvement in the ST evaluation of inferior leads.  -EKG reviewed with MD, continue IV heparin and IV nitroglycerin.  Plan for cardiac catheterization today.  I was only able to add a beta-blocker given heart rate in the 50s.  Patient is on diltiazem at home.  I plan to add a beta-blocker after the cardiac catheterization to replace her home diltiazem.  2. Hypertension: Blood  pressure stable in the ED.  On diltiazem at home  3. Hyperlipidemia: On 20 mg daily of Lipitor at home.  Will uptitrate to 80 mg daily.  4. DM2: On Jardiance and Metformin.  Holding both, sliding scale insulin  5. Obstructive sleep apnea on CPAP  6. Recent COVID-19 infection: Occurred in December 2020.   Severity of Illness: The appropriate patient status for this patient is OBSERVATION. Observation status is judged to be reasonable and necessary in order to provide the required intensity of service to ensure the patient's safety. The patient's presenting symptoms, physical exam findings, and initial radiographic and laboratory data in the context of their medical condition is felt to place them at decreased risk for further clinical deterioration. Furthermore, it is anticipated that the patient will be medically stable for discharge from the hospital within 2 midnights of admission. The following factors support the patient status of observation.   " The patient's presenting symptoms include chest pain. " The physical exam findings include benign. " The initial radiographic and laboratory data are elevated troponin, abnormal EKG.     For questions or updates, please  contact [place] Please consult [email or web address] for contact info under        Hilbert Corrigan, [place]  02/29/2020 12:01 PM

## 2020-02-29 NOTE — Progress Notes (Signed)
  Echocardiogram 2D Echocardiogram has been performed.  Jannett Celestine 02/29/2020, 12:56 PM

## 2020-02-29 NOTE — ED Triage Notes (Signed)
Pt here from home for eval of central chest pain with radiation to under R breast onset last night. Nausea this morning. Pt able to sleep but pain present every time she woke up. EMS gave 1 nitro and 324 ASA with improvement. Pt 88% on room air on EMS arrival.

## 2020-02-29 NOTE — Progress Notes (Signed)
ANTICOAGULATION CONSULT NOTE - Initial Consult  Pharmacy Consult for heparin Indication: chest pain/ACS  No Known Allergies  Patient Measurements: Height: 5' (152.4 cm) Weight: 179 lb (81.2 kg) IBW/kg (Calculated) : 45.5 Heparin Dosing Weight: 64.2kg  Vital Signs: Temp: 97.9 F (36.6 C) (03/29 1016) Temp Source: Oral (03/29 1016) BP: 135/64 (03/29 1045) Pulse Rate: 60 (03/29 1045)  Labs: No results for input(s): HGB, HCT, PLT, APTT, LABPROT, INR, HEPARINUNFRC, HEPRLOWMOCWT, CREATININE, CKTOTAL, CKMB, TROPONINIHS in the last 72 hours.  CrCl cannot be calculated (Patient's most recent lab result is older than the maximum 21 days allowed.).   Medical History: Past Medical History:  Diagnosis Date  . Anxiety   . Back pain   . Complication of anesthesia 2003   gallbladder surgery-resp. arrest-stop operation and pt. went to ICU - patient stated she has back surgery since then and had no trouble at all  . Depression   . Diabetes ([place])   . Dyspnea   . Gout   . History of kidney stones   . Hypertension   . Leg edema   . Pancreatitis, chronic ([place]) 03/2016  . Pneumonia due to COVID-19 virus 12/03/2019  . Sleep apnea     Medications:  Infusions:  . heparin    . nitroGLYCERIN    . sodium chloride      Assessment: 62 yof presented to the ED with CP. To start IV heparin. Baseline CBC is pending but has been normal in the past. She is not on anticoagulation PTA.   Goal of Therapy:  Heparin level 0.3-0.7 units/ml Monitor platelets by anticoagulation protocol: Yes   Plan:  Heparin bolus 3800 units IV x 1 Heparin gtt 800 units/hr Check an 8 hr heparin level Daily heparin level and CBC  Jodee Wagenaar, Rande Lawman 02/29/2020,11:16 AM

## 2020-02-29 NOTE — Progress Notes (Signed)
ANTICOAGULATION CONSULT NOTE - Follow Up Consult  Pharmacy Consult for heparin Indication: chest pain/ACS  No Known Allergies  Patient Measurements: Height: 5' (152.4 cm) Weight: 178 lb 12.8 oz (81.1 kg) IBW/kg (Calculated) : 45.5 Heparin Dosing Weight: 64.2 kg  Vital Signs: Temp: 97.8 F (36.6 C) (03/29 2209) Temp Source: Oral (03/29 2209) BP: 109/66 (03/29 2209) Pulse Rate: 73 (03/29 2209)  Labs: Recent Labs    02/29/20 1037 02/29/20 1309 02/29/20 1732 02/29/20 2137  HGB 14.0  --   --   --   HCT 43.9  --   --   --   PLT 285  --   --   --   HEPARINUNFRC  --   --   --  0.12*  CREATININE 1.18*  --   --   --   TROPONINIHS 61* 302* 2,127*  --     Estimated Creatinine Clearance: 37.6 mL/min (A) (by C-G formula based on SCr of 1.18 mg/dL (H)).   Medical History: Past Medical History:  Diagnosis Date  . Anxiety   . Back pain   . Complication of anesthesia 2003   gallbladder surgery-resp. arrest-stop operation and pt. went to ICU - patient stated she has back surgery since then and had no trouble at all  . Depression   . Diabetes ([place])   . Dyspnea   . Gout   . History of kidney stones   . Hypertension   . Leg edema   . Pancreatitis, chronic ([place]) 03/2016  . Pneumonia due to COVID-19 virus 12/03/2019  . Sleep apnea     Assessment:  79 yr old female presented to the ED with CP and elevated troponin; pharmacy was consulted to start IV heparin. Pt is being treated medically for NSTEMI. Pt was not on anticoagulation PTA.   Heparin level ~9.4 hrs after heparin 3800 units IV bolus, followed by heparin infusion at 800 units/hr, was 0.12 units/ml, which is below the goal range for this pt. CBC WNL. Per RN, no issues with IV or bleeding observed.  Left heart cath planned for tomorrow, 03/01/20.  Goal of Therapy:  Heparin level 0.3-0.7 units/ml Monitor platelets by anticoagulation protocol: Yes   Plan:  Heparin bolus 1900 units IV x 1 Increase heparin infusion to  1050 units/hr Check 8-hr heparin level Monitor daily heparin level, CBC Monitor for signs/symptoms of bleeding F/U after left heart cath  Gillermina Hu, PharmD, BCPS, [facility] Clinical Pharmacist 02/29/2020,10:13 PM

## 2020-03-01 ENCOUNTER — Inpatient Hospital Stay (HOSPITAL_COMMUNITY): Payer: Medicare Other

## 2020-03-01 ENCOUNTER — Encounter (HOSPITAL_COMMUNITY)
Admission: EM | Disposition: A | Discharge status: Home / Self Care | Payer: Self-pay | Source: Home / Self Care | Attending: Thoracic Surgery (Cardiothoracic Vascular Surgery)

## 2020-03-01 ENCOUNTER — Encounter: Payer: Self-pay | Admitting: Cardiology

## 2020-03-01 DIAGNOSIS — R29702 NIHSS score 2: Secondary | ICD-10-CM | POA: Diagnosis not present

## 2020-03-01 DIAGNOSIS — R4701 Aphasia: Secondary | ICD-10-CM | POA: Diagnosis not present

## 2020-03-01 DIAGNOSIS — R4702 Dysphasia: Secondary | ICD-10-CM | POA: Diagnosis not present

## 2020-03-01 DIAGNOSIS — I6330 Cerebral infarction due to thrombosis of unspecified cerebral artery: Secondary | ICD-10-CM | POA: Diagnosis not present

## 2020-03-01 DIAGNOSIS — D62 Acute posthemorrhagic anemia: Secondary | ICD-10-CM | POA: Diagnosis not present

## 2020-03-01 DIAGNOSIS — R29810 Facial weakness: Secondary | ICD-10-CM | POA: Diagnosis not present

## 2020-03-01 DIAGNOSIS — E1169 Type 2 diabetes mellitus with other specified complication: Secondary | ICD-10-CM | POA: Diagnosis not present

## 2020-03-01 DIAGNOSIS — E119 Type 2 diabetes mellitus without complications: Secondary | ICD-10-CM | POA: Diagnosis present

## 2020-03-01 DIAGNOSIS — F329 Major depressive disorder, single episode, unspecified: Secondary | ICD-10-CM | POA: Diagnosis present

## 2020-03-01 DIAGNOSIS — Z01810 Encounter for preprocedural cardiovascular examination: Secondary | ICD-10-CM | POA: Diagnosis not present

## 2020-03-01 DIAGNOSIS — I214 Non-ST elevation (NSTEMI) myocardial infarction: Secondary | ICD-10-CM | POA: Diagnosis not present

## 2020-03-01 DIAGNOSIS — Z8616 Personal history of COVID-19: Secondary | ICD-10-CM | POA: Diagnosis not present

## 2020-03-01 DIAGNOSIS — Z6837 Body mass index (BMI) 37.0-37.9, adult: Secondary | ICD-10-CM | POA: Diagnosis not present

## 2020-03-01 DIAGNOSIS — I25110 Atherosclerotic heart disease of native coronary artery with unstable angina pectoris: Secondary | ICD-10-CM | POA: Diagnosis not present

## 2020-03-01 DIAGNOSIS — I6523 Occlusion and stenosis of bilateral carotid arteries: Secondary | ICD-10-CM

## 2020-03-01 DIAGNOSIS — Y840 Cardiac catheterization as the cause of abnormal reaction of the patient, or of later complication, without mention of misadventure at the time of the procedure: Secondary | ICD-10-CM | POA: Diagnosis not present

## 2020-03-01 DIAGNOSIS — R4182 Altered mental status, unspecified: Secondary | ICD-10-CM | POA: Diagnosis present

## 2020-03-01 DIAGNOSIS — E785 Hyperlipidemia, unspecified: Secondary | ICD-10-CM | POA: Diagnosis present

## 2020-03-01 DIAGNOSIS — Z7984 Long term (current) use of oral hypoglycemic drugs: Secondary | ICD-10-CM | POA: Diagnosis not present

## 2020-03-01 DIAGNOSIS — G4733 Obstructive sleep apnea (adult) (pediatric): Secondary | ICD-10-CM | POA: Diagnosis present

## 2020-03-01 DIAGNOSIS — I63441 Cerebral infarction due to embolism of right cerebellar artery: Secondary | ICD-10-CM | POA: Diagnosis not present

## 2020-03-01 DIAGNOSIS — I97820 Postprocedural cerebrovascular infarction during cardiac surgery: Secondary | ICD-10-CM | POA: Diagnosis not present

## 2020-03-01 DIAGNOSIS — Z951 Presence of aortocoronary bypass graft: Secondary | ICD-10-CM | POA: Diagnosis not present

## 2020-03-01 DIAGNOSIS — F419 Anxiety disorder, unspecified: Secondary | ICD-10-CM | POA: Diagnosis present

## 2020-03-01 DIAGNOSIS — I10 Essential (primary) hypertension: Secondary | ICD-10-CM | POA: Diagnosis not present

## 2020-03-01 DIAGNOSIS — J9811 Atelectasis: Secondary | ICD-10-CM | POA: Diagnosis not present

## 2020-03-01 DIAGNOSIS — E669 Obesity, unspecified: Secondary | ICD-10-CM | POA: Diagnosis present

## 2020-03-01 DIAGNOSIS — D72829 Elevated white blood cell count, unspecified: Secondary | ICD-10-CM | POA: Diagnosis not present

## 2020-03-01 DIAGNOSIS — Z87442 Personal history of urinary calculi: Secondary | ICD-10-CM | POA: Diagnosis not present

## 2020-03-01 DIAGNOSIS — Z79899 Other long term (current) drug therapy: Secondary | ICD-10-CM | POA: Diagnosis not present

## 2020-03-01 HISTORY — PX: LEFT HEART CATH AND CORONARY ANGIOGRAPHY: CATH118249

## 2020-03-01 LAB — CBC
HCT: 37.6 % (ref 36.0–46.0)
Hemoglobin: 12.2 g/dL (ref 12.0–15.0)
MCH: 29.9 pg (ref 26.0–34.0)
MCHC: 32.4 g/dL (ref 30.0–36.0)
MCV: 92.2 fL (ref 80.0–100.0)
Platelets: 292 10*3/uL (ref 150–400)
RBC: 4.08 MIL/uL (ref 3.87–5.11)
RDW: 13.2 % (ref 11.5–15.5)
WBC: 13.1 10*3/uL — ABNORMAL HIGH (ref 4.0–10.5)
nRBC: 0 % (ref 0.0–0.2)

## 2020-03-01 LAB — BASIC METABOLIC PANEL
Anion gap: 9 (ref 5–15)
BUN: 20 mg/dL (ref 8–23)
CO2: 28 mmol/L (ref 22–32)
Calcium: 9.2 mg/dL (ref 8.9–10.3)
Chloride: 101 mmol/L (ref 98–111)
Creatinine, Ser: 1.16 mg/dL — ABNORMAL HIGH (ref 0.44–1.00)
GFR calc Af Amer: 53 mL/min — ABNORMAL LOW (ref >60)
GFR calc non Af Amer: 45 mL/min — ABNORMAL LOW (ref >60)
Glucose, Bld: 119 mg/dL — ABNORMAL HIGH (ref 70–99)
Potassium: 3.9 mmol/L (ref 3.5–5.1)
Sodium: 138 mmol/L (ref 135–145)

## 2020-03-01 LAB — GLUCOSE, CAPILLARY
Glucose-Capillary: 100 mg/dL — ABNORMAL HIGH (ref 70–99)
Glucose-Capillary: 115 mg/dL — ABNORMAL HIGH (ref 70–99)
Glucose-Capillary: 117 mg/dL — ABNORMAL HIGH (ref 70–99)
Glucose-Capillary: 95 mg/dL (ref 70–99)

## 2020-03-01 LAB — BLOOD GAS, ARTERIAL
Acid-Base Excess: 1.4 mmol/L (ref 0.0–2.0)
Bicarbonate: 25.4 mmol/L (ref 20.0–28.0)
FIO2: 28
O2 Saturation: 95.9 %
Patient temperature: 36.6
pCO2 arterial: 39.2 mmHg (ref 32.0–48.0)
pH, Arterial: 7.425 (ref 7.350–7.450)
pO2, Arterial: 78.2 mmHg — ABNORMAL LOW (ref 83.0–108.0)

## 2020-03-01 LAB — LIPID PANEL
Cholesterol: 89 mg/dL (ref 0–200)
HDL: 39 mg/dL — ABNORMAL LOW (ref >40)
LDL Cholesterol: 30 mg/dL (ref 0–99)
Total CHOL/HDL Ratio: 2.3 RATIO
Triglycerides: 101 mg/dL (ref <150)
VLDL: 20 mg/dL (ref 0–40)

## 2020-03-01 LAB — TROPONIN I (HIGH SENSITIVITY)
Troponin I (High Sensitivity): 13775 ng/L (ref <18)
Troponin I (High Sensitivity): 12678 ng/L (ref <18)

## 2020-03-01 LAB — HEPARIN LEVEL (UNFRACTIONATED): Heparin Unfractionated: 0.15 IU/mL — ABNORMAL LOW (ref 0.30–0.70)

## 2020-03-01 SURGERY — LEFT HEART CATH AND CORONARY ANGIOGRAPHY
Anesthesia: LOCAL

## 2020-03-01 MED ORDER — HEPARIN (PORCINE) 25000 UT/250ML-% IV SOLN
1300.0000 [IU]/h | INTRAVENOUS | Status: DC
  Administered 2020-03-01: 21:00:00 1250 [IU]/h via INTRAVENOUS
  Filled 2020-03-01 (×2): qty 250

## 2020-03-01 MED ORDER — SODIUM CHLORIDE 0.9% FLUSH: 3.0000 mL | INTRAVENOUS | Status: DC | PRN

## 2020-03-01 MED ORDER — VERAPAMIL HCL 2.5 MG/ML IV SOLN
INTRAVENOUS | Status: DC | PRN
  Administered 2020-03-01: 10 mL via INTRA_ARTERIAL

## 2020-03-01 MED ORDER — ASPIRIN 81 MG PO CHEW
81.0000 mg | CHEWABLE_TABLET | ORAL | Status: AC
  Administered 2020-03-01: 81 mg via ORAL
  Filled 2020-03-01: qty 1

## 2020-03-01 MED ORDER — SODIUM CHLORIDE 0.9 % IV SOLN: 250.0000 mL | INTRAVENOUS | Status: DC | PRN

## 2020-03-01 MED ORDER — SODIUM CHLORIDE 0.9 % WEIGHT BASED INFUSION
1.0000 mL/kg/h | INTRAVENOUS | Status: DC
  Administered 2020-03-01: 1 mL/kg/h via INTRAVENOUS

## 2020-03-01 MED ORDER — HEPARIN SODIUM (PORCINE) 1000 UNIT/ML IJ SOLN
INTRAMUSCULAR | Status: AC
  Filled 2020-03-01: qty 1

## 2020-03-01 MED ORDER — NITROGLYCERIN IN D5W 200-5 MCG/ML-% IV SOLN
0.0000 ug/min | INTRAVENOUS | Status: DC
  Administered 2020-03-01: 40 ug/min via INTRAVENOUS
  Filled 2020-03-01: qty 250

## 2020-03-01 MED ORDER — OXYCODONE HCL 5 MG PO TABS: 5.0000 mg | ORAL_TABLET | ORAL | Status: DC | PRN

## 2020-03-01 MED ORDER — IOHEXOL 350 MG/ML SOLN
60.0000 mL | Freq: Once | INTRAVENOUS | Status: AC | PRN
  Administered 2020-03-01: 17:00:00 60 mL via INTRAVENOUS

## 2020-03-01 MED ORDER — HEPARIN (PORCINE) IN NACL 1000-0.9 UT/500ML-% IV SOLN
INTRAVENOUS | Status: DC | PRN
  Administered 2020-03-01 (×2): 500 mL

## 2020-03-01 MED ORDER — OXYCODONE-ACETAMINOPHEN 7.5-325 MG PO TABS
1.0000 | ORAL_TABLET | Freq: Four times a day (QID) | ORAL | Status: DC | PRN
  Administered 2020-03-01 – 2020-03-02 (×2): 1 via ORAL
  Filled 2020-03-01 (×2): qty 1

## 2020-03-01 MED ORDER — VERAPAMIL HCL 2.5 MG/ML IV SOLN
INTRAVENOUS | Status: AC
  Filled 2020-03-01: qty 2

## 2020-03-01 MED ORDER — MORPHINE SULFATE (PF) 2 MG/ML IV SOLN
2.0000 mg | INTRAVENOUS | Status: DC | PRN
  Administered 2020-03-01: 2 mg via INTRAVENOUS
  Filled 2020-03-01: qty 1

## 2020-03-01 MED ORDER — SODIUM CHLORIDE 0.9 % WEIGHT BASED INFUSION
3.0000 mL/kg/h | INTRAVENOUS | Status: DC
  Administered 2020-03-01: 3 mL/kg/h via INTRAVENOUS

## 2020-03-01 MED ORDER — LIDOCAINE HCL (PF) 1 % IJ SOLN
INTRAMUSCULAR | Status: DC | PRN
  Administered 2020-03-01: 2 mL

## 2020-03-01 MED ORDER — HEPARIN BOLUS VIA INFUSION
2000.0000 [IU] | Freq: Once | INTRAVENOUS | Status: AC
  Administered 2020-03-01: 2000 [IU] via INTRAVENOUS
  Filled 2020-03-01: qty 2000

## 2020-03-01 MED ORDER — LIDOCAINE HCL (PF) 1 % IJ SOLN
INTRAMUSCULAR | Status: AC
  Filled 2020-03-01: qty 30

## 2020-03-01 MED ORDER — LABETALOL HCL 5 MG/ML IV SOLN: 10.0000 mg | INTRAVENOUS | Status: AC | PRN

## 2020-03-01 MED ORDER — MIDAZOLAM HCL 2 MG/2ML IJ SOLN
INTRAMUSCULAR | Status: DC | PRN
  Administered 2020-03-01: 1 mg via INTRAVENOUS

## 2020-03-01 MED ORDER — ONDANSETRON HCL 4 MG/2ML IJ SOLN: 4.0000 mg | Freq: Four times a day (QID) | INTRAMUSCULAR | Status: DC | PRN

## 2020-03-01 MED ORDER — MIDAZOLAM HCL 2 MG/2ML IJ SOLN
INTRAMUSCULAR | Status: AC
  Filled 2020-03-01: qty 2

## 2020-03-01 MED ORDER — ACETAMINOPHEN 325 MG PO TABS: 650.0000 mg | ORAL_TABLET | ORAL | Status: DC | PRN

## 2020-03-01 MED ORDER — SODIUM CHLORIDE 0.9 % WEIGHT BASED INFUSION: 1.0000 mL/kg/h | INTRAVENOUS | Status: AC

## 2020-03-01 MED ORDER — IOHEXOL 350 MG/ML SOLN
INTRAVENOUS | Status: DC | PRN
  Administered 2020-03-01: 85 mL

## 2020-03-01 MED ORDER — ASPIRIN 81 MG PO CHEW: 81.0000 mg | CHEWABLE_TABLET | Freq: Every day | ORAL | Status: DC

## 2020-03-01 MED ORDER — HEPARIN (PORCINE) IN NACL 1000-0.9 UT/500ML-% IV SOLN
INTRAVENOUS | Status: AC
  Filled 2020-03-01: qty 1000

## 2020-03-01 MED ORDER — ATORVASTATIN CALCIUM 80 MG PO TABS: 80.0000 mg | ORAL_TABLET | Freq: Every day | ORAL | Status: DC

## 2020-03-01 MED ORDER — FENTANYL CITRATE (PF) 100 MCG/2ML IJ SOLN
INTRAMUSCULAR | Status: DC | PRN
  Administered 2020-03-01: 25 ug via INTRAVENOUS

## 2020-03-01 MED ORDER — HEPARIN SODIUM (PORCINE) 1000 UNIT/ML IJ SOLN
INTRAMUSCULAR | Status: DC | PRN
  Administered 2020-03-01: 4000 [IU] via INTRAVENOUS

## 2020-03-01 MED ORDER — FENTANYL CITRATE (PF) 100 MCG/2ML IJ SOLN
INTRAMUSCULAR | Status: AC
  Filled 2020-03-01: qty 2

## 2020-03-01 MED ORDER — SODIUM CHLORIDE 0.9% FLUSH
3.0000 mL | Freq: Two times a day (BID) | INTRAVENOUS | Status: DC
  Administered 2020-03-01: 3 mL via INTRAVENOUS

## 2020-03-01 MED ORDER — HYDRALAZINE HCL 20 MG/ML IJ SOLN: 10.0000 mg | INTRAMUSCULAR | Status: AC | PRN

## 2020-03-01 MED ORDER — SODIUM CHLORIDE 0.9% FLUSH
3.0000 mL | Freq: Two times a day (BID) | INTRAVENOUS | Status: DC
  Administered 2020-03-02 (×2): 3 mL via INTRAVENOUS

## 2020-03-01 SURGICAL SUPPLY — 9 items

## 2020-03-01 NOTE — Significant Event (Signed)
Called by nursing to assess Judy [name]. She is quite confused after her cath. Needs CABG. She is talking full sentences but going on and on about incoherent topics. She is not dysarthric and no slurred speech. There are no focal deficits and she is able to move all extremities. She did receive sedation and could be delirious. Vitals are stable and R radial cath site looks clean. Discussed with rapid that we will obtain an ABG and a stat head CT. I do not think she is having a stroke but we will evaluate further.   [name], [place]  [address] [place], [place] [address] [phone]  1:49 PM

## 2020-03-01 NOTE — Progress Notes (Signed)
Pre CABG exam completed  Preliminary results can be found under CV proc under chart review.  03/01/2020 4:30 PM  Fabian Coca, K., RDMS, RVT

## 2020-03-01 NOTE — Progress Notes (Signed)
Patient complaining chest pain pressure in center of chest non radiating.According to patient she has been having chest pain off and on since last night.Patient currently rating pain 8/10.Patient blood pressure 131/57 heart rate 53 sinus brady on monitor.Patient remains on nitroglycerin drip and heparin drip.12 lead EKG obtained.Text paged Dr.Ugowe awaiting response.

## 2020-03-01 NOTE — CV Procedure (Signed)
   Left heart cath with coronary angiography via right radial approach using real-time vascular ultrasound for access.  4  0% distal calcified left main  Segmental 80 to 90% proximal to mid LAD.  A very large first diagonal which bifurcates in its mid segment contains severe disease in each limb of the bifurcation.  LAD flow is TIMI grade II with right to left septal perforator collaterals.  Circumflex contains severe mid vessel disease before the origin of the first obtuse marginal.  After the origin of the first obtuse marginal of the circumflex is totally occluded.  Left to left collaterals are noted filling the second obtuse marginal  RCA is dominant.  RCA is is recently totally occluded before the origin of the PDA.  PDA and left ventricular branches fill via right to right and left to right collaterals.  The larger left ventricular branch fills sluggishly.  Acute marginal branch collaterals supply septal perforators to the LAD.  Basal to mid inferior wall severe hypokinesis.  EF 45%.  No obvious mitral regurgitation.  LVEDP 16 mmHg.  Patient needs multivessel coronary bypass grafting soon as flow to the LAD is sluggish.  Patient is stable on IV nitroglycerin without ongoing chest pain.

## 2020-03-01 NOTE — Progress Notes (Signed)
Pt placed on cpap 

## 2020-03-01 NOTE — Progress Notes (Signed)
Patient very confused and sluttering words. Rapid response called and Dr. Audie Box notified. Will continue to monitor.  Paulene Floor, RN

## 2020-03-01 NOTE — Significant Event (Addendum)
Rapid Response Event Note  Overview: Time Called: [id number] Arrival Time: 1320 Event Type: Other (Comment)(Altered mental status.) Pt returned from cath lab, mentation was at baseline. RN came back into the pt's room and noted her to be confused, stuttering her words. During procedure pt received 1mg  IV Versed, 25 mcg IV Fentanyl. LSN 12:22 when pt returned from cath lab.  VS: BP 105/65 (79), HR 54, RR 15, SpO2 96% on 2LNC.   Initial Focused Assessment: Pt lying in bed, HOB at 40 degrees. Pt is alert, disoriented. Pt able to follow commands. PERRLA, 32mm. EOMI. No focal deficits. No visual loss. No drift x4 extremities. No ataxia. Speech is clear, pt has expressive aphasia exhibited by "word finding" when trying to name the pictured objects. No extinction or inattention. NIH 3. See NIHSS flowsheet. Pt is in no respiratory distress. Warm, dry to touch. Lung sounds are clear.  Upon returning from CT, pt HOB was laid flat. Pt still asking persistent questions about her CABG possibly tomorrow, but is no longer exhibiting expressive aphasia when identifying pictures objects. NIH 0. See NIHSS flowsheet. Pt now sleepy and placed on CPAP.   Addendum: Rounded on pt at 1620 with Dr. Audie Box. Pt presenting like she did upon my original assessment at 1330. No focal deficit. Pt again is noted to have expressive aphasia as assessed in her difficulty finding words when trying to name pictured objects. Pt is oriented to month, but disoriented to her age. NIH 2. See NIHSS flowsheet. Neurology consulted. David-PA with neurology agreed with my assessment, but also noted a minor left facial droop. Code stroke activated at 1647. CTA completed, no large vessel occlusion. Follow-up MRI ordered. I called and updated Dr. Audie Box in regards to CTA and plan for MRI. Pt complaining of severe headache, is on Nitro gtt. Headache non-relieved by PRN Tylenol. Dr. Audie Box notified by primary RN.    Interventions: -Stat CT head ordered by  Dr. Audie Box, MD at bedside and assessed pt decision made not to activate Code Stroke at this time. -ABG  7.425/39.2/78.2/25.4  -1647: Code Stroke activated- CTA completed  Plan of Care (if not transferred): -mNIHSS q2h x 12 hours  Call rapid response for further needs.  Event Summary: Name of Physician Notified: Dr. Audie Box at 1325 Outcome: Stayed in room and stabalized Event End Time: [place]

## 2020-03-01 NOTE — H&P (View-Only) (Signed)
Cardiology Progress Note  Patient ID: Judy [name] MRN: [id number] DOB: [birth year] Date of Encounter: 03/01/2020  Primary Cardiologist: No primary care provider on file.  Subjective  Nitroglycerin increased overnight.  Chest pain-free this morning.  Plan for left heart cath this morning.  Consented in the room by me.  ROS:  All other ROS reviewed and negative. Pertinent positives noted in the HPI.     Inpatient Medications  Scheduled Meds: . aspirin  324 mg Oral NOW   Or  . aspirin  300 mg Rectal NOW  . aspirin  81 mg Oral Pre-Cath  . aspirin EC  81 mg Oral Daily  . atorvastatin  80 mg Oral q1800  . metoprolol tartrate  12.5 mg Oral BID  . sodium chloride flush  3 mL Intravenous Q12H   Continuous Infusions: . sodium chloride    . sodium chloride     Followed by  . sodium chloride    . heparin 1,050 Units/hr (03/01/20 [id number])  . nitroGLYCERIN 40 mcg/min (03/01/20 0031)   PRN Meds: sodium chloride, acetaminophen, morphine injection, ondansetron (ZOFRAN) IV, sodium chloride flush   Vital Signs   Vitals:   03/01/20 0102 03/01/20 0353 03/01/20 0401 03/01/20 0626  BP: (!) 93/50 (!) 99/56 (!) 95/55 (!) 92/58  Pulse: (!) 54 (!) 55 (!) 52 64  Resp: 17 16 16 16   Temp:  97.7 F (36.5 C) 97.8 F (36.6 C)   TempSrc:  Oral Oral   SpO2: 98% 96% 96% 92%  Weight:      Height:        Intake/Output Summary (Last 24 hours) at 03/01/2020 0813 Last data filed at 03/01/2020 0600 Gross per 24 hour  Intake 848.25 ml  Output --  Net 848.25 ml   Last 3 Weights 02/29/2020 02/29/2020 02/09/2020  Weight (lbs) 178 lb 12.8 oz 179 lb 184 lb  Weight (kg) 81.103 kg 81.194 kg 83.462 kg      Telemetry  Overnight telemetry shows sinus bradycardia heart rate 50-60, which I personally reviewed.   ECG  The most recent ECG shows sinus bradycardia, inferior Q waves noted, these are new, which I personally reviewed.   Physical Exam   Vitals:   03/01/20 0102 03/01/20 0353 03/01/20 0401 03/01/20  0626  BP: (!) 93/50 (!) 99/56 (!) 95/55 (!) 92/58  Pulse: (!) 54 (!) 55 (!) 52 64  Resp: 17 16 16 16   Temp:  97.7 F (36.5 C) 97.8 F (36.6 C)   TempSrc:  Oral Oral   SpO2: 98% 96% 96% 92%  Weight:      Height:         Intake/Output Summary (Last 24 hours) at 03/01/2020 0813 Last data filed at 03/01/2020 0600 Gross per 24 hour  Intake 848.25 ml  Output --  Net 848.25 ml    Last 3 Weights 02/29/2020 02/29/2020 02/09/2020  Weight (lbs) 178 lb 12.8 oz 179 lb 184 lb  Weight (kg) 81.103 kg 81.194 kg 83.462 kg    Body mass index is 34.92 kg/m.   General: Well nourished, well developed, in no acute distress Head: Atraumatic, normal size  Eyes: PEERLA, EOMI  Neck: Supple, no JVD Endocrine: No thryomegaly Cardiac: Normal S1, S2; RRR; no murmurs, rubs, or gallops Lungs: Clear to auscultation bilaterally, no wheezing, rhonchi or rales  Abd: Soft, nontender, no hepatomegaly  Ext: No edema, pulses 2+ Musculoskeletal: No deformities, BUE and BLE strength normal and equal Skin: Warm and dry, no rashes   Neuro: Alert  and oriented to person, place, time, and situation, CNII-XII grossly intact, no focal deficits  Psych: Normal mood and affect   Labs  High Sensitivity Troponin:   Recent Labs  Lab 02/29/20 1037 02/29/20 1309 02/29/20 1732  TROPONINIHS 61* 302* 2,127*     Cardiac EnzymesNo results for input(s): TROPONINI in the last 168 hours. No results for input(s): TROPIPOC in the last 168 hours.  Chemistry Recent Labs  Lab 02/29/20 1037 03/01/20 0351  NA 139 138  K 4.3 3.9  CL 100 101  CO2 26 28  GLUCOSE 144* 119*  BUN 18 20  CREATININE 1.18* 1.16*  CALCIUM 10.0 9.2  PROT 7.1  --   ALBUMIN 4.1  --   AST 20  --   ALT 23  --   ALKPHOS 75  --   BILITOT 0.5  --   GFRNONAA 44* 45*  GFRAA 52* 53*  ANIONGAP 13 9    Hematology Recent Labs  Lab 02/29/20 1037 03/01/20 0351  WBC 9.4 13.1*  RBC 4.68 4.08  HGB 14.0 12.2  HCT 43.9 37.6  MCV 93.8 92.2  MCH 29.9 29.9   MCHC 31.9 32.4  RDW 13.2 13.2  PLT 285 292   BNPNo results for input(s): BNP, PROBNP in the last 168 hours.  DDimer  Recent Labs  Lab 02/29/20 1037  DDIMER 0.57*     Radiology  CT ANGIO CHEST PE W OR WO CONTRAST  Result Date: 02/29/2020 CLINICAL DATA:  Shortness of breath, chest pain EXAM: CT ANGIOGRAPHY CHEST WITH CONTRAST TECHNIQUE: Multidetector CT imaging of the chest was performed using the standard protocol during bolus administration of intravenous contrast. Multiplanar CT image reconstructions and MIPs were obtained to evaluate the vascular anatomy. CONTRAST:  63mL OMNIPAQUE IOHEXOL 350 MG/ML SOLN COMPARISON:  02/29/2020, 01/20/2020 FINDINGS: Cardiovascular: This is a technically adequate evaluation of the pulmonary vasculature. No filling defects or pulmonary emboli. Stable mild cardiomegaly. Mild atherosclerosis of the aorta and coronary vasculature. Thoracic aorta is normal in caliber. Mediastinum/Nodes: No enlarged mediastinal, hilar, or axillary lymph nodes. Thyroid gland, trachea, and esophagus demonstrate no significant findings. Lungs/Pleura: No acute airspace disease, effusion, or pneumothorax. Central airways are patent. Upper Abdomen: No acute abnormality. Musculoskeletal: There are no acute or destructive bony lesions. Reconstructed images demonstrate no additional findings. Review of the MIP images confirms the above findings. IMPRESSION: 1. No evidence of pulmonary embolus. 2. No acute intrathoracic process. 3.  Aortic Atherosclerosis (ICD10-I70.0). Electronically Signed   By: Randa Ngo M.D.   On: 02/29/2020 15:42   DG Chest Portable 1 View  Result Date: 02/29/2020 CLINICAL DATA:  Chest pain EXAM: PORTABLE CHEST 1 VIEW COMPARISON:  December 06, 2019 FINDINGS: There is mild left base atelectasis. Lungs elsewhere are clear. Heart is mildly enlarged with pulmonary vascularity normal. No adenopathy. There is degenerative change in the thoracic spine. There is calcification  in the left carotid artery. IMPRESSION: Mild left base atelectasis. Lungs elsewhere clear. Stable cardiac prominence. Aortic Atherosclerosis (ICD10-I70.0). There is also left carotid artery calcification. Electronically Signed   By: Lowella Grip III M.D.   On: 02/29/2020 10:47   ECHOCARDIOGRAM COMPLETE  Result Date: 02/29/2020    ECHOCARDIOGRAM REPORT   Patient Name:   Judy [name] Date of Exam: 02/29/2020 Medical Rec #:  [id number]      Height:       60.0 in Accession #:    [id number]     Weight:       179.0 lb Date of  Birth:  [birth year]      BSA:          1.781 m Patient Age:    47 years       BP:           130/71 mmHg Patient Gender: F              HR:           61 bpm. Exam Location:  Inpatient Procedure: 2D Echo STAT ECHO Indications:    chest pain 786.50  History:        Patient has prior history of Echocardiogram examinations, most                 recent 03/23/2016. Risk Factors:Hypertension and Diabetes.  Sonographer:    Jannett Celestine RDCS (AE) Referring Phys: [phone] HAO MENG IMPRESSIONS  1. Left ventricular ejection fraction, by estimation, is 45 to 50%. The left ventricle has mildly decreased function. The left ventricle demonstrates regional wall motion abnormalities (see scoring diagram/findings for description). Inferior hypokinesis. There is mild left ventricular hypertrophy. Left ventricular diastolic parameters are indeterminate.  2. Right ventricular systolic function is normal. The right ventricular size is normal.  3. The mitral valve is normal in structure. Trivial mitral valve regurgitation.  4. The aortic valve was not well visualized. Aortic valve regurgitation is not visualized. No aortic stenosis is present. FINDINGS  Left Ventricle: Left ventricular ejection fraction, by estimation, is 45 to 50%. The left ventricle has mildly decreased function. The left ventricle demonstrates regional wall motion abnormalities. The left ventricular internal cavity size was normal in size. There is  mild left ventricular hypertrophy. Left ventricular diastolic parameters are indeterminate.  LV Wall Scoring: The inferior wall is hypokinetic. The entire anterior wall, entire lateral wall, entire septum, and entire apex are normal. Right Ventricle: The right ventricular size is normal. Right vetricular wall thickness was not assessed. Right ventricular systolic function is normal. Left Atrium: Left atrial size was normal in size. Right Atrium: Right atrial size was normal in size. Pericardium: Trivial pericardial effusion is present. Mitral Valve: The mitral valve is normal in structure. Trivial mitral valve regurgitation. Tricuspid Valve: The tricuspid valve is normal in structure. Tricuspid valve regurgitation is trivial. Aortic Valve: The aortic valve was not well visualized. Aortic valve regurgitation is not visualized. No aortic stenosis is present. Pulmonic Valve: The pulmonic valve was not well visualized. Pulmonic valve regurgitation is not visualized. Aorta: The aortic root is normal in size and structure. IAS/Shunts: The interatrial septum was not well visualized.  LEFT VENTRICLE PLAX 2D LVIDd:         4.70 cm  Diastology LVIDs:         3.30 cm  LV e' lateral:   5.33 cm/s LV PW:         1.20 cm  LV E/e' lateral: 11.6 LV IVS:        1.10 cm  LV e' medial:    6.74 cm/s LVOT diam:     2.00 cm  LV E/e' medial:  9.2 LV SV:         48 LV SV Index:   27 LVOT Area:     3.14 cm  RIGHT VENTRICLE RV S prime:     11.50 cm/s TAPSE (M-mode): 2.0 cm LEFT ATRIUM             Index       RIGHT ATRIUM  Index LA diam:        3.70 cm 2.08 cm/m  RA Area:     17.40 cm LA Vol (A2C):   32.6 ml 18.31 ml/m RA Volume:   42.20 ml  23.70 ml/m LA Vol (A4C):   45.6 ml 25.61 ml/m LA Biplane Vol: 41.4 ml 23.25 ml/m  AORTIC VALVE LVOT Vmax:   62.70 cm/s LVOT Vmean:  46.300 cm/s LVOT VTI:    0.154 m  AORTA Ao Root diam: 3.00 cm MITRAL VALVE MV Area (PHT): 3.83 cm    SHUNTS MV Decel Time: 198 msec    Systemic VTI:  0.15 m  MV E velocity: 61.70 cm/s  Systemic Diam: 2.00 cm MV A velocity: 85.70 cm/s MV E/A ratio:  0.72 Oswaldo Milian MD Electronically signed by Oswaldo Milian MD Signature Date/Time: 02/29/2020/1:51:24 PM    Final     Cardiac Studies   TTE 02/29/2020 1. Left ventricular ejection fraction, by estimation, is 45 to 50%. The  left ventricle has mildly decreased function. The left ventricle  demonstrates regional wall motion abnormalities (see scoring  diagram/findings for description). Inferior  hypokinesis. There is mild left ventricular hypertrophy. Left ventricular  diastolic parameters are indeterminate.  2. Right ventricular systolic function is normal. The right ventricular  size is normal.  3. The mitral valve is normal in structure. Trivial mitral valve  regurgitation.  4. The aortic valve was not well visualized. Aortic valve regurgitation  is not visualized. No aortic stenosis is present.   Patient Profile  Ms. Judy [name] is a 79 year old female with medical history of OSA on CPAP, recent COVID-19 infection, hypertension, diabetes, obesity who was admitted with chest pain for 1 day.  Cardiac enzymes are elevated.  EKG initially with minimal ST elevation that resolved.  Evaluated by interventional cardiology who felt this was not a STEMI.  Admitted for non-STEMI.  Assessment & Plan   1.  Non-STEMI -EKG with sinus bradycardia and now inferior Q waves.  Really unclear how she developed Q waves in a matter of 12 hours. -Continue aspirin heparin drip.  She remains on high intensity statin.  N.p.o. for left heart cath today. -No heart failure symptoms.  Telemetry stable. -Echocardiogram with inferior hypokinesis.  Suspect this will be a inferior MI. -She will continue nitro drip for now.  Chest pain-free this morning.  2.  Hypertension -Hold BP meds and while on nitro drip  3.  Hyperlipidemia -High intensity statin  4.  Diabetes -A1c 7.1 -Holding oral agents while  in-house -Sliding-scale insulin  FEN -IVF pre cath -NPO -dvt ppx: heparin  -code: full   For questions or updates, please contact [place] Please consult [email or web address] for contact info under   Time Spent with Patient: I have spent a total of 35 minutes with patient reviewing hospital notes, telemetry, EKGs, labs and examining the patient as well as establishing an assessment and plan that was discussed with the patient.  > 50% of time was spent in direct patient care.    Signed, Addison Naegeli. Audie Box, [place]  03/01/2020 8:13 AM

## 2020-03-01 NOTE — Interval H&P Note (Signed)
Cath Lab Visit (complete for each Cath Lab visit)  Clinical Evaluation Leading to the Procedure:   ACS: Yes.    Non-ACS:    Anginal Classification: CCS IV  Anti-ischemic medical therapy: Minimal Therapy (1 class of medications)  Non-Invasive Test Results: No non-invasive testing performed  Prior CABG: No previous CABG      History and Physical Interval Note:  03/01/2020 10:55 AM  [name]  has presented today for surgery, with the diagnosis of chest pain.  The various methods of treatment have been discussed with the patient and family. After consideration of risks, benefits and other options for treatment, the patient has consented to  Procedure(s): LEFT HEART CATH AND CORONARY ANGIOGRAPHY (N/A) as a surgical intervention.  The patient's history has been reviewed, patient examined, no change in status, stable for surgery.  I have reviewed the patient's chart and labs.  Questions were answered to the patient's satisfaction.     Belva Crome III

## 2020-03-01 NOTE — Progress Notes (Signed)
Patient back to room 4E16 from cath lab. Vital signs obtained. Alert and oriented to room and call light. Call bell within reach.  Paulene Floor, RN

## 2020-03-01 NOTE — Consult Note (Addendum)
Neurology Consultation  Reason for Consult: Abnormal mentation/aphasia  Referring Physician: Dr. Davina Poke  CC: Abnormal mentation  History is obtained from: Nurse and son  HPI: Judy [name] is a 79 y.o. female with past medical history of sleep apnea, leg edema, hypertension, diabetes.  Patient was admitted for chest pain x 1 day, cardiac enzymes elevated, EKG initially with minimal ST elevation.  Today patient went for a cardiac cath.  Upon returning patient seemed to be slightly altered in mentation.  Neurology was consulted for altered mentation.  Upon consultation patient initially was able to answer questions however it was noted throughout consultation she was having difficulty expressing herself and naming objects.  She also had a right facial droop.  Per nurse she was last seen normal at 1230.  This put patient out of the TPA window.  However due to the expressive aphasia, code stroke was called and it was deemed appropriate to evaluate for possible large vessel occlusion.  LKW: 1230 tpa given?: no, of window Premorbid modified Rankin scale (mRS): 0 NIH stroke score: 2  Van score 1 for aphasia   Past Medical History:  Diagnosis Date  . Anxiety   . Back pain   . Complication of anesthesia 2003   gallbladder surgery-resp. arrest-stop operation and pt. went to ICU - patient stated she has back surgery since then and had no trouble at all  . Depression   . Diabetes ([place])   . Dyspnea   . Gout   . History of kidney stones   . Hypertension   . Leg edema   . Pancreatitis, chronic ([place]) 03/2016  . Pneumonia due to COVID-19 virus 12/03/2019  . Sleep apnea      Family History  Problem Relation Age of Onset  . Dementia Mother   . AAA (abdominal aortic aneurysm) Father   . Breast cancer Maternal Aunt    Social History:   reports that she has never smoked. She has never used smokeless tobacco. She reports that she does not drink alcohol or use drugs.  Medications  Current  Facility-Administered Medications:  .  0.9 %  sodium chloride infusion, 250 mL, Intravenous, PRN, Belva Crome, MD .  0.9% sodium chloride infusion, 1 mL/kg/hr, Intravenous, Continuous, Belva Crome, MD .  acetaminophen (TYLENOL) tablet 650 mg, 650 mg, Oral, Q4H PRN, Belva Crome, MD, 650 mg at 03/01/20 1538 .  aspirin EC tablet 81 mg, 81 mg, Oral, Daily, Belva Crome, MD .  atorvastatin (LIPITOR) tablet 80 mg, 80 mg, Oral, q1800, Belva Crome, MD, 80 mg at 02/29/20 1811 .  heparin ADULT infusion 100 units/mL (25000 units/242mL sodium chloride 0.45%), 1,250 Units/hr, Intravenous, Continuous, Kris Mouton, RPH .  hydrALAZINE (APRESOLINE) injection 10 mg, 10 mg, Intravenous, Q20 Min PRN, Belva Crome, MD .  labetalol (NORMODYNE) injection 10 mg, 10 mg, Intravenous, Q10 min PRN, Belva Crome, MD .  metoprolol tartrate (LOPRESSOR) tablet 12.5 mg, 12.5 mg, Oral, BID, Belva Crome, MD, 12.5 mg at 02/29/20 2239 .  morphine 2 MG/ML injection 2 mg, 2 mg, Intravenous, Q3H PRN, Belva Crome, MD, 2 mg at 03/01/20 0102 .  nitroGLYCERIN 50 mg in dextrose 5 % 250 mL (0.2 mg/mL) infusion, 0-200 mcg/min, Intravenous, Titrated, Belva Crome, MD, Last Rate: 12 mL/hr at 03/01/20 1538, 40 mcg/min at 03/01/20 1538 .  ondansetron (ZOFRAN) injection 4 mg, 4 mg, Intravenous, Q6H PRN, Belva Crome, MD, 4 mg at 03/01/20 0108 .  oxyCODONE (Oxy IR/ROXICODONE) immediate release tablet 5-10 mg, 5-10 mg, Oral, Q4H PRN, Belva Crome, MD .  sodium chloride flush (NS) 0.9 % injection 3 mL, 3 mL, Intravenous, Q12H, Belva Crome, MD .  sodium chloride flush (NS) 0.9 % injection 3 mL, 3 mL, Intravenous, PRN, Belva Crome, MD  ROS:    General ROS: negative for - chills, fatigue, fever, night sweats, weight gain or weight loss Psychological ROS: negative for - behavioral disorder, hallucinations, memory difficulties, mood swings or suicidal ideation Ophthalmic ROS: negative for - blurry vision, double  vision, eye pain or loss of vision ENT ROS: negative for - epistaxis, nasal discharge, oral lesions, sore throat, tinnitus or vertigo Allergy and Immunology ROS: negative for - hives or itchy/watery eyes Hematological and Lymphatic ROS: negative for - bleeding problems, bruising or swollen lymph nodes Endocrine ROS: negative for - galactorrhea, hair pattern changes, polydipsia/polyuria or temperature intolerance Respiratory ROS: negative for - cough, hemoptysis, shortness of breath or wheezing Cardiovascular ROS: negative for - chest pain, dyspnea on exertion, edema or irregular heartbeat Gastrointestinal ROS: negative for - abdominal pain, diarrhea, hematemesis, nausea/vomiting or stool incontinence Genito-Urinary ROS: negative for - dysuria, hematuria, incontinence or urinary frequency/urgency Musculoskeletal ROS: negative for - joint swelling or muscular weakness Neurological ROS: as noted in HPI Dermatological ROS: negative for rash and skin lesion changes  Exam: Current vital signs: BP (!) 94/52   Pulse (!) 59   Temp 97.9 F (36.6 C) (Oral)   Resp 17   Ht 5' (1.524 m)   Wt 81.5 kg   SpO2 96%   BMI 35.09 kg/m  Vital signs in last 24 hours: Temp:  [97.7 F (36.5 C)-97.9 F (36.6 C)] 97.9 F (36.6 C) (03/30 1223) Pulse Rate:  [50-85] 59 (03/30 1430) Resp:  [11-79] 17 (03/30 1430) BP: (92-143)/(48-81) 94/52 (03/30 1430) SpO2:  [90 %-99 %] 96 % (03/30 1430) Weight:  [81.1 kg-81.5 kg] 81.5 kg (03/30 0744)   Constitutional: Appears well-developed and well-nourished.  Eyes: No scleral injection HENT: No OP obstrucion Head: Normocephalic.  Cardiovascular: Normal rate and regular rhythm.  Respiratory: Effort normal, non-labored breathing GI: Soft.  No distension. There is no tenderness.  Skin: WDI  Neuro: Mental Status: Patient is awake, alert, oriented to person, place, month, year, and situation. Speech-at times with difficulty naming and repeating, however,  comprehension is intact. Cranial Nerves: II: Visual Fields are full. PERRL. III,IV, VI: EOMI without ptosis or diplopia.  V: Facial sensation is symmetric to temperature VII: Slight left facial droop VIII: hearing impaired  X: Palate elevates symmetrically XI: Shoulder shrug is symmetric. XII: tongue is midline without atrophy or fasciculations.  Motor: Tone is normal. Bulk is normal. 5/5 strength was present in all four extremities.  Drift none Asterixis none Sensory: Sensation is symmetric to light touch and temperature in the arms and legs. DSS intact Deep Tendon Reflexes: 2+ and symmetric in the biceps and patellae.  Plantars: Toes are downgoing bilaterally.  Cerebellar: FNF and HKS are intact bilaterally  Labs I have reviewed labs in epic and the results pertinent to this consultation are:  CBC    Component Value Date/Time   WBC 13.1 (H) 03/01/2020 0351   RBC 4.08 03/01/2020 0351   HGB 12.2 03/01/2020 0351   HGB 11.5 01/12/2020 1138   HCT 37.6 03/01/2020 0351   HCT 34.5 01/12/2020 1138   PLT 292 03/01/2020 0351   PLT 287 01/12/2020 1138   MCV 92.2 03/01/2020 0351  MCV 91 01/12/2020 1138   MCH 29.9 03/01/2020 0351   MCHC 32.4 03/01/2020 0351   RDW 13.2 03/01/2020 0351   RDW 13.5 01/12/2020 1138   LYMPHSABS 1.2 02/29/2020 1037   LYMPHSABS 1.7 01/12/2020 1138   MONOABS 0.3 02/29/2020 1037   EOSABS 0.1 02/29/2020 1037   EOSABS 0.1 01/12/2020 1138   BASOSABS 0.0 02/29/2020 1037   BASOSABS 0.0 01/12/2020 1138    CMP     Component Value Date/Time   NA 138 03/01/2020 0351   NA 142 01/12/2020 1138   K 3.9 03/01/2020 0351   CL 101 03/01/2020 0351   CO2 28 03/01/2020 0351   GLUCOSE 119 (H) 03/01/2020 0351   BUN 20 03/01/2020 0351   BUN 12 01/12/2020 1138   CREATININE 1.16 (H) 03/01/2020 0351   CALCIUM 9.2 03/01/2020 0351   PROT 7.1 02/29/2020 1037   PROT 6.2 01/12/2020 1138   ALBUMIN 4.1 02/29/2020 1037   ALBUMIN 3.8 01/12/2020 1138   AST 20 02/29/2020  1037   ALT 23 02/29/2020 1037   ALKPHOS 75 02/29/2020 1037   BILITOT 0.5 02/29/2020 1037   BILITOT 0.2 01/12/2020 1138   GFRNONAA 45 (L) 03/01/2020 0351   GFRAA 53 (L) 03/01/2020 0351    Lipid Panel     Component Value Date/Time   CHOL 89 03/01/2020 0351   CHOL 184 06/19/2018 0958   TRIG 101 03/01/2020 0351   HDL 39 (L) 03/01/2020 0351   HDL 45 06/19/2018 0958   CHOLHDL 2.3 03/01/2020 0351   VLDL 20 03/01/2020 0351   LDLCALC 30 03/01/2020 0351   LDLCALC 107 (H) 06/19/2018 0958     Imaging I have reviewed the images obtained:  CT-scan of the brain-no acute intracranial abnormality  CTA of head and neck-no large vessel occlusion  MRI examination of the brain-pending  Etta Quill PA-C [facility] [phone] 03/01/2020, 5:25 PM     Assessment: 79 year old female who initially was being evaluated for chest pain.  Patient had a cardiac catheterization this morning.  Post catheterization patient seemed to have altered mentation and was noted to have what appeared to be expressive aphasia.   1. On exam patient shows a left facial droop with expressive dysphasia but appeared to comprehend and follow commands, although she is hard of hearing.   2. She is out of the window for tPA.   3. CTA head and neck do not show any large vessel occlusion.   4. Will obtain MRI brain STAT to assess for possible stroke not visible on CT. Given left facial droop with dysphasia, she may be right-hemisphere dominant with regard to speech function.  Recommendations: -MRI brain without contrast.   --Further recommendations following MRI.   I have seen and examined the patient. My exam findings were observed and documented by Etta Quill, PA Electronically signed: Dr. Kerney Elbe

## 2020-03-01 NOTE — Progress Notes (Signed)
0004 Dr.Ugowe returned call.Plan to titrate nitroglycerin drip.Nitroglycerin drip increased to 35 mcg/kg /min.Patient still rating pain 8/10.0029 Per patient pain has decreased 4/10 blood pressure 131/61 nitroglycerin drip increased 40 mcg/kg/min. 0102 Patient now complaining pain increasing 6/10 also complaining of nausea blood pressure 114/57 prn dose of 2 mg morphine given and 4 mg of zofran given.0132 Patient pain free and nausea resolved.

## 2020-03-01 NOTE — Progress Notes (Signed)
ANTICOAGULATION CONSULT NOTE - Follow Up Consult  Pharmacy Consult for heparin Indication: chest pain/ACS  No Known Allergies  Patient Measurements: Height: 5' (152.4 cm) Weight: 179 lb 10.8 oz (81.5 kg) IBW/kg (Calculated) : 45.5 Heparin Dosing Weight: 64.2 kg  Vital Signs: Temp: 97.9 F (36.6 C) (03/30 1223) Temp Source: Oral (03/30 1223) BP: 101/60 (03/30 1223) Pulse Rate: 50 (03/30 1234)  Labs: Recent Labs    02/29/20 1037 02/29/20 1309 02/29/20 1732 02/29/20 2137 03/01/20 0351 03/01/20 0650 03/01/20 0832 03/01/20 1010  HGB 14.0  --   --   --  12.2  --   --   --   HCT 43.9  --   --   --  37.6  --   --   --   PLT 285  --   --   --  292  --   --   --   HEPARINUNFRC  --   --   --  0.12*  --  0.15*  --   --   CREATININE 1.18*  --   --   --  1.16*  --   --   --   TROPONINIHS 61*   < > 2,127*  --   --   --  13,775* 12,678*   < > = values in this interval not displayed.    Estimated Creatinine Clearance: 38.4 mL/min (A) (by C-G formula based on SCr of 1.16 mg/dL (H)).   Medical History: Past Medical History:  Diagnosis Date  . Anxiety   . Back pain   . Complication of anesthesia 2003   gallbladder surgery-resp. arrest-stop operation and pt. went to ICU - patient stated she has back surgery since then and had no trouble at all  . Depression   . Diabetes ([place])   . Dyspnea   . Gout   . History of kidney stones   . Hypertension   . Leg edema   . Pancreatitis, chronic ([place]) 03/2016  . Pneumonia due to COVID-19 virus 12/03/2019  . Sleep apnea     Assessment:  79 yr old female presented to the ED with CP and elevated troponin; pharmacy was consulted to start IV heparin. Pt is being treated medically for NSTEMI. Pt was not on anticoagulation PTA.    She is now s/p cath and for CABG evaluation. Heparin to restart 8 hours post sheath removal (removed ~ 12:00pm)   Goal of Therapy:  Heparin level 0.3-0.7 units/ml Monitor platelets by anticoagulation protocol:  Yes   Plan:  -Restart heparin at 8pm at 1200 units/hr -Heparin level  daily wth CBC dail  Hildred Laser, PharmD Clinical Pharmacist **Pharmacist phone directory can now be found on [email or web address] ([email or web address]).  Listed under [place].

## 2020-03-01 NOTE — Consult Note (Signed)
[address]       [place] [address]             [phone]        Anjela H Teeple [facility]une Medical Record [id number] Date of Birth: 05/27/1941  Referring: No ref. provider found Primary Care: Michael Boston, MD Primary Cardiologist:No primary care provider on file.  Chief Complaint:    Chief Complaint  Patient presents with  . Chest Pain    History of Present Illness:     79 year old female with history of diabetes mellitus and hypertension is admitted to the hospital following an episode of acute onset chest pain.  During her evaluation in the emergency department she was noted to have ST changes as well as elevations in her troponin levels.  Her pain was relieved with a nitroglycerin drip.  She was subsequently admitted and taken to the Cath Lab today for further evaluation.  Her left heart cath showed severe three-vessel disease, and her echocardiogram showed an EF of 45 to 50%, and no significant valvular disease.  After catheterization she returned to the ward where she was noted to be confused and have some expressive aphasia.  A code stroke was called and a CT scan was performed which was negative for evaluation of stroke or significant carotid or cerebrovascular disease.  She is currently awaiting an MRI after evaluation by the neurologic service.   Past Medical and Surgical History: Previous Chest Surgery: No Previous Chest Radiation: No Diabetes Mellitus: Yes.  HbA1C 7 Creatinine: 1.16  Past Medical History:  Diagnosis Date  . Anxiety   . Back pain   . Complication of anesthesia 2003   gallbladder surgery-resp. arrest-stop operation and pt. went to ICU - patient stated she has back surgery since then and had no trouble at all  . Depression   . Diabetes ([place])   . Dyspnea   . Gout   . History of kidney stones   . Hypertension   . Leg edema   . Pancreatitis, chronic ([place]) 03/2016  . Pneumonia due to COVID-19 virus 12/03/2019  . Sleep  apnea     Past Surgical History:  Procedure Laterality Date  . BACK SURGERY    . BREAST LUMPECTOMY WITH RADIOACTIVE SEED LOCALIZATION Right 10/23/2019   Procedure: RIGHT BREAST LUMPECTOMY WITH RADIOACTIVE SEED LOCALIZATION;  Surgeon: Jovita Kussmaul, MD;  Location: [facility];  Service: General;  Laterality: Right;  . CHOLECYSTECTOMY    . COLONOSCOPY  2013  . LEFT HEART CATH AND CORONARY ANGIOGRAPHY N/A 03/01/2020   Procedure: LEFT HEART CATH AND CORONARY ANGIOGRAPHY;  Surgeon: Belva Crome, MD;  Location: [facility];  Service: Cardiovascular;  Laterality: N/A;     Social History   Tobacco Use  Smoking Status Never Smoker  Smokeless Tobacco Never Used    Social History   Substance and Sexual Activity  Alcohol Use No  . Alcohol/week: 0.0 standard drinks     No Known Allergies  Medications: Asprin: Yes Statin: Yes Beta Blocker: Yes Ace Inhibitor: No Anti-Coagulation: Heparin drip  Current Facility-Administered Medications  Medication Dose Route Frequency Provider Last Rate Last Admin  . 0.9 %  sodium chloride infusion  250 mL Intravenous PRN Belva Crome, MD      . 0.9% sodium chloride infusion  1 mL/kg/hr Intravenous Continuous Belva Crome, MD      . acetaminophen (TYLENOL) tablet 650 mg  650 mg Oral Q4H PRN Tamala Julian,  Lynnell Dike, MD   650 mg at 03/01/20 1538  . aspirin EC tablet 81 mg  81 mg Oral Daily Belva Crome, MD      . atorvastatin (LIPITOR) tablet 80 mg  80 mg Oral q1800 Belva Crome, MD   80 mg at 03/01/20 1752  . heparin ADULT infusion 100 units/mL (25000 units/242mL sodium chloride 0.45%)  1,250 Units/hr Intravenous Continuous Kris Mouton, [facility]      . metoprolol tartrate (LOPRESSOR) tablet 12.5 mg  12.5 mg Oral BID Belva Crome, MD   12.5 mg at 02/29/20 2239  . morphine 2 MG/ML injection 2 mg  2 mg Intravenous Q3H PRN Belva Crome, MD   2 mg at 03/01/20 0102  . nitroGLYCERIN 50 mg in dextrose 5 % 250 mL (0.2 mg/mL) infusion  0-200 mcg/min  Intravenous Titrated Belva Crome, MD 12 mL/hr at 03/01/20 1538 40 mcg/min at 03/01/20 1538  . ondansetron (ZOFRAN) injection 4 mg  4 mg Intravenous Q6H PRN Belva Crome, MD   4 mg at 03/01/20 0108  . oxyCODONE-acetaminophen (PERCOCET) 7.5-325 MG per tablet 1 tablet  1 tablet Oral Q6H PRN Geralynn Rile, MD   1 tablet at 03/01/20 1752  . sodium chloride flush (NS) 0.9 % injection 3 mL  3 mL Intravenous Q12H Belva Crome, MD      . sodium chloride flush (NS) 0.9 % injection 3 mL  3 mL Intravenous PRN Belva Crome, MD        Medications Prior to Admission  Medication Sig Dispense Refill Last Dose  . atorvastatin (LIPITOR) 20 MG tablet Take 20 mg by mouth at bedtime.    02/28/2020 at Unknown time  . diltiazem (CARDIZEM CD) 240 MG 24 hr capsule Take 1 capsule (240 mg total) by mouth daily. (Patient taking differently: Take 240 mg by mouth at bedtime. ) 30 capsule 4 02/28/2020 at Unknown time  . empagliflozin (JARDIANCE) 10 MG TABS tablet Take 10 mg by mouth daily. (Patient taking differently: Take 10 mg by mouth at bedtime. ) 30 tablet 4 02/28/2020 at Unknown time  . furosemide (LASIX) 20 MG tablet Take 1 tablet (20 mg total) by mouth daily as needed for edema. 30 tablet 4 Past Week at Unknown time  . imipramine (TOFRANIL) 50 MG tablet Take 1 tablet (50 mg total) by mouth at bedtime. 30 tablet 4 02/28/2020 at Unknown time  . metFORMIN (GLUCOPHAGE) 1000 MG tablet Take 1 tablet (1,000 mg total) by mouth 2 (two) times daily with a meal. 60 tablet 4 02/28/2020 at Unknown time  . nystatin cream (MYCOSTATIN) Apply 1 application topically 2 (two) times daily as needed for dry skin.    Past Week at Unknown time  . clonazePAM (KLONOPIN) 0.5 MG tablet Take 1 tablet (0.5 mg total) by mouth at bedtime. (Patient not taking: Reported on 02/29/2020) 30 tablet 0 Not Taking at Unknown time  . etodolac (LODINE) 400 MG tablet Take 1 tablet (400 mg total) by mouth daily as needed for moderate pain. (Patient not  taking: Reported on 02/03/2020) 30 tablet 0 Not Taking at Unknown time  . glucose blood (ACCU-CHEK AVIVA) test strip Test twice daily 100 each 2   . Lancets (ACCU-CHEK SAFE-T PRO) lancets Test twice daily 100 each 11   . NON FORMULARY 1 each by Other route See admin instructions. CPAP machine nightly       Family History  Problem Relation Age of Onset  . Dementia Mother   .  AAA (abdominal aortic aneurysm) Father   . Breast cancer Maternal Aunt      Review of Systems:   Review of Systems  Unable to perform ROS: Mental status change      Physical Exam: BP 100/67 (BP Location: Left Arm)   Pulse (!) 55   Temp 97.9 F (36.6 C) (Oral)   Resp 19   Ht 5' (1.524 m)   Wt 81.5 kg   SpO2 94%   BMI 35.09 kg/m  Physical Exam  Constitutional: No distress.  Nontoxic-appearing.  HENT:  Head: Normocephalic and atraumatic.  Mouth/Throat: Oropharynx is clear and moist.  Eyes: Conjunctivae and EOM are normal. No scleral icterus.  Neck: No tracheal deviation present.  Cardiovascular: Regular rhythm and normal heart sounds.  No murmur heard. Bradycardia  Pulmonary/Chest: Effort normal and breath sounds normal. No respiratory distress.  Abdominal: Soft. She exhibits no distension. There is no abdominal tenderness.  Musculoskeletal:        General: Normal range of motion.  Neurological:  Alert Oriented only to person Repetitive speech with fixations on months of the year.  Skin: Skin is warm and dry. She is not diaphoretic.      Diagnostic Studies & Laboratory data:    Left Heart Catherization: Tight LAD stenosis.  Good distal runoff Of tandem lesions in the circumflex distribution.  Good targets on OM 1 and 2 Stenosis in the diagonal branch with good runoff Distal RCA stenosis involving the PDA and PLV.  Both fill from left to right collaterals.  Echo: Good right heart and left heart function.  No significant valvular disease   I have independently reviewed the above  radiologic studies and discussed with the patient   Recent Lab Findings: Lab Results  Component Value Date   WBC 13.1 (H) 03/01/2020   HGB 12.2 03/01/2020   HCT 37.6 03/01/2020   PLT 292 03/01/2020   GLUCOSE 119 (H) 03/01/2020   CHOL 89 03/01/2020   TRIG 101 03/01/2020   HDL 39 (L) 03/01/2020   LDLCALC 30 03/01/2020   ALT 23 02/29/2020   AST 20 02/29/2020   NA 138 03/01/2020   K 3.9 03/01/2020   CL 101 03/01/2020   CREATININE 1.16 (H) 03/01/2020   BUN 20 03/01/2020   CO2 28 03/01/2020   TSH 1.950 01/21/2018   HGBA1C 7.1 (H) 02/09/2020      Assessment / Plan:   79 year old female admitted following an NSTEMI who now has altered mental status with expressive aphasia.  She is currently being evaluated by neurology and is awaiting an MRI.  In regards to her coronary disease she has good targets and good function for surgical bypass, however surgery will need to be delayed until the cause of her mental status change is further delineated.  Her family states that she had a similar episode when she was admitted previously with Covid, and her mental status improved after 2 to 3 days.  Prior to this she was living independently with a roommate, thus I am optimistic that if she undergoes surgery she will be able to tolerate rehab if her mental status improves.  I will follow her on a daily basis to assess if there is any improvement in her mental status and when she is deemed fit she will be taken for surgical bypass.     I  spent 40 minutes counseling the patient face to face.   Lajuana Matte 03/01/2020 6:56 PM

## 2020-03-01 NOTE — Progress Notes (Addendum)
ANTICOAGULATION CONSULT NOTE - Follow Up Consult  Pharmacy Consult for heparin Indication: chest pain/ACS  No Known Allergies  Patient Measurements: Height: 5' (152.4 cm) Weight: 179 lb 10.8 oz (81.5 kg) IBW/kg (Calculated) : 45.5 Heparin Dosing Weight: 64.2 kg  Vital Signs: Temp: 97.8 F (36.6 C) (03/30 0800) Temp Source: Oral (03/30 0800) BP: 105/66 (03/30 0800) Pulse Rate: 56 (03/30 0800)  Labs: Recent Labs    02/29/20 1037 02/29/20 1309 02/29/20 1732 02/29/20 2137 03/01/20 0351 03/01/20 0650  HGB 14.0  --   --   --  12.2  --   HCT 43.9  --   --   --  37.6  --   PLT 285  --   --   --  292  --   HEPARINUNFRC  --   --   --  0.12*  --  0.15*  CREATININE 1.18*  --   --   --  1.16*  --   TROPONINIHS 61* 302* 2,127*  --   --   --     Estimated Creatinine Clearance: 38.4 mL/min (A) (by C-G formula based on SCr of 1.16 mg/dL (H)).   Medical History: Past Medical History:  Diagnosis Date  . Anxiety   . Back pain   . Complication of anesthesia 2003   gallbladder surgery-resp. arrest-stop operation and pt. went to ICU - patient stated she has back surgery since then and had no trouble at all  . Depression   . Diabetes ([place])   . Dyspnea   . Gout   . History of kidney stones   . Hypertension   . Leg edema   . Pancreatitis, chronic ([place]) 03/2016  . Pneumonia due to COVID-19 virus 12/03/2019  . Sleep apnea     Assessment:  79 yr old female presented to the ED with CP and elevated troponin; pharmacy was consulted to start IV heparin. Pt is being treated medically for NSTEMI. Pt was not on anticoagulation PTA.  For cath today -heparin level remains below goal    Goal of Therapy:  Heparin level 0.3-0.7 units/ml Monitor platelets by anticoagulation protocol: Yes   Plan:  Heparin bolus 2000 units IV x 1 Increase heparin infusion to 1250 units/hr F/U after left heart cath  Hildred Laser, PharmD Clinical Pharmacist **Pharmacist phone directory can now be found  on [email or web address] ([email or web address]).  Listed under [place].

## 2020-03-01 NOTE — Progress Notes (Signed)
Cardiology Progress Note  Patient ID: Judy [name] MRN: [id number] DOB: [birth year] Date of Encounter: 03/01/2020  Primary Cardiologist: No primary care provider on file.  Subjective  Nitroglycerin increased overnight.  Chest pain-free this morning.  Plan for left heart cath this morning.  Consented in the room by me.  ROS:  All other ROS reviewed and negative. Pertinent positives noted in the HPI.     Inpatient Medications  Scheduled Meds: . aspirin  324 mg Oral NOW   Or  . aspirin  300 mg Rectal NOW  . aspirin  81 mg Oral Pre-Cath  . aspirin EC  81 mg Oral Daily  . atorvastatin  80 mg Oral q1800  . metoprolol tartrate  12.5 mg Oral BID  . sodium chloride flush  3 mL Intravenous Q12H   Continuous Infusions: . sodium chloride    . sodium chloride     Followed by  . sodium chloride    . heparin 1,050 Units/hr (03/01/20 [id number])  . nitroGLYCERIN 40 mcg/min (03/01/20 0031)   PRN Meds: sodium chloride, acetaminophen, morphine injection, ondansetron (ZOFRAN) IV, sodium chloride flush   Vital Signs   Vitals:   03/01/20 0102 03/01/20 0353 03/01/20 0401 03/01/20 0626  BP: (!) 93/50 (!) 99/56 (!) 95/55 (!) 92/58  Pulse: (!) 54 (!) 55 (!) 52 64  Resp: 17 16 16 16   Temp:  97.7 F (36.5 C) 97.8 F (36.6 C)   TempSrc:  Oral Oral   SpO2: 98% 96% 96% 92%  Weight:      Height:        Intake/Output Summary (Last 24 hours) at 03/01/2020 0813 Last data filed at 03/01/2020 0600 Gross per 24 hour  Intake 848.25 ml  Output --  Net 848.25 ml   Last 3 Weights 02/29/2020 02/29/2020 02/09/2020  Weight (lbs) 178 lb 12.8 oz 179 lb 184 lb  Weight (kg) 81.103 kg 81.194 kg 83.462 kg      Telemetry  Overnight telemetry shows sinus bradycardia heart rate 50-60, which I personally reviewed.   ECG  The most recent ECG shows sinus bradycardia, inferior Q waves noted, these are new, which I personally reviewed.   Physical Exam   Vitals:   03/01/20 0102 03/01/20 0353 03/01/20 0401 03/01/20  0626  BP: (!) 93/50 (!) 99/56 (!) 95/55 (!) 92/58  Pulse: (!) 54 (!) 55 (!) 52 64  Resp: 17 16 16 16   Temp:  97.7 F (36.5 C) 97.8 F (36.6 C)   TempSrc:  Oral Oral   SpO2: 98% 96% 96% 92%  Weight:      Height:         Intake/Output Summary (Last 24 hours) at 03/01/2020 0813 Last data filed at 03/01/2020 0600 Gross per 24 hour  Intake 848.25 ml  Output --  Net 848.25 ml    Last 3 Weights 02/29/2020 02/29/2020 02/09/2020  Weight (lbs) 178 lb 12.8 oz 179 lb 184 lb  Weight (kg) 81.103 kg 81.194 kg 83.462 kg    Body mass index is 34.92 kg/m.   General: Well nourished, well developed, in no acute distress Head: Atraumatic, normal size  Eyes: PEERLA, EOMI  Neck: Supple, no JVD Endocrine: No thryomegaly Cardiac: Normal S1, S2; RRR; no murmurs, rubs, or gallops Lungs: Clear to auscultation bilaterally, no wheezing, rhonchi or rales  Abd: Soft, nontender, no hepatomegaly  Ext: No edema, pulses 2+ Musculoskeletal: No deformities, BUE and BLE strength normal and equal Skin: Warm and dry, no rashes   Neuro: Alert  and oriented to person, place, time, and situation, CNII-XII grossly intact, no focal deficits  Psych: Normal mood and affect   Labs  High Sensitivity Troponin:   Recent Labs  Lab 02/29/20 1037 02/29/20 1309 02/29/20 1732  TROPONINIHS 61* 302* 2,127*     Cardiac EnzymesNo results for input(s): TROPONINI in the last 168 hours. No results for input(s): TROPIPOC in the last 168 hours.  Chemistry Recent Labs  Lab 02/29/20 1037 03/01/20 0351  NA 139 138  K 4.3 3.9  CL 100 101  CO2 26 28  GLUCOSE 144* 119*  BUN 18 20  CREATININE 1.18* 1.16*  CALCIUM 10.0 9.2  PROT 7.1  --   ALBUMIN 4.1  --   AST 20  --   ALT 23  --   ALKPHOS 75  --   BILITOT 0.5  --   GFRNONAA 44* 45*  GFRAA 52* 53*  ANIONGAP 13 9    Hematology Recent Labs  Lab 02/29/20 1037 03/01/20 0351  WBC 9.4 13.1*  RBC 4.68 4.08  HGB 14.0 12.2  HCT 43.9 37.6  MCV 93.8 92.2  MCH 29.9 29.9   MCHC 31.9 32.4  RDW 13.2 13.2  PLT 285 292   BNPNo results for input(s): BNP, PROBNP in the last 168 hours.  DDimer  Recent Labs  Lab 02/29/20 1037  DDIMER 0.57*     Radiology  CT ANGIO CHEST PE W OR WO CONTRAST  Result Date: 02/29/2020 CLINICAL DATA:  Shortness of breath, chest pain EXAM: CT ANGIOGRAPHY CHEST WITH CONTRAST TECHNIQUE: Multidetector CT imaging of the chest was performed using the standard protocol during bolus administration of intravenous contrast. Multiplanar CT image reconstructions and MIPs were obtained to evaluate the vascular anatomy. CONTRAST:  44mL OMNIPAQUE IOHEXOL 350 MG/ML SOLN COMPARISON:  02/29/2020, 01/20/2020 FINDINGS: Cardiovascular: This is a technically adequate evaluation of the pulmonary vasculature. No filling defects or pulmonary emboli. Stable mild cardiomegaly. Mild atherosclerosis of the aorta and coronary vasculature. Thoracic aorta is normal in caliber. Mediastinum/Nodes: No enlarged mediastinal, hilar, or axillary lymph nodes. Thyroid gland, trachea, and esophagus demonstrate no significant findings. Lungs/Pleura: No acute airspace disease, effusion, or pneumothorax. Central airways are patent. Upper Abdomen: No acute abnormality. Musculoskeletal: There are no acute or destructive bony lesions. Reconstructed images demonstrate no additional findings. Review of the MIP images confirms the above findings. IMPRESSION: 1. No evidence of pulmonary embolus. 2. No acute intrathoracic process. 3.  Aortic Atherosclerosis (ICD10-I70.0). Electronically Signed   By: Randa Ngo M.D.   On: 02/29/2020 15:42   DG Chest Portable 1 View  Result Date: 02/29/2020 CLINICAL DATA:  Chest pain EXAM: PORTABLE CHEST 1 VIEW COMPARISON:  December 06, 2019 FINDINGS: There is mild left base atelectasis. Lungs elsewhere are clear. Heart is mildly enlarged with pulmonary vascularity normal. No adenopathy. There is degenerative change in the thoracic spine. There is calcification  in the left carotid artery. IMPRESSION: Mild left base atelectasis. Lungs elsewhere clear. Stable cardiac prominence. Aortic Atherosclerosis (ICD10-I70.0). There is also left carotid artery calcification. Electronically Signed   By: Lowella Grip III M.D.   On: 02/29/2020 10:47   ECHOCARDIOGRAM COMPLETE  Result Date: 02/29/2020    ECHOCARDIOGRAM REPORT   Patient Name:   Judy [name] Date of Exam: 02/29/2020 Medical Rec #:  [id number]      Height:       60.0 in Accession #:    [id number]     Weight:       179.0 lb Date of  Birth:  01/12/1941      BSA:          1.781 m Patient Age:    2 years       BP:           130/71 mmHg Patient Gender: F              HR:           61 bpm. Exam Location:  Inpatient Procedure: 2D Echo STAT ECHO Indications:    chest pain 786.50  History:        Patient has prior history of Echocardiogram examinations, most                 recent 03/23/2016. Risk Factors:Hypertension and Diabetes.  Sonographer:    Jannett Celestine RDCS (AE) Referring Phys: [phone] HAO MENG IMPRESSIONS  1. Left ventricular ejection fraction, by estimation, is 45 to 50%. The left ventricle has mildly decreased function. The left ventricle demonstrates regional wall motion abnormalities (see scoring diagram/findings for description). Inferior hypokinesis. There is mild left ventricular hypertrophy. Left ventricular diastolic parameters are indeterminate.  2. Right ventricular systolic function is normal. The right ventricular size is normal.  3. The mitral valve is normal in structure. Trivial mitral valve regurgitation.  4. The aortic valve was not well visualized. Aortic valve regurgitation is not visualized. No aortic stenosis is present. FINDINGS  Left Ventricle: Left ventricular ejection fraction, by estimation, is 45 to 50%. The left ventricle has mildly decreased function. The left ventricle demonstrates regional wall motion abnormalities. The left ventricular internal cavity size was normal in size. There is  mild left ventricular hypertrophy. Left ventricular diastolic parameters are indeterminate.  LV Wall Scoring: The inferior wall is hypokinetic. The entire anterior wall, entire lateral wall, entire septum, and entire apex are normal. Right Ventricle: The right ventricular size is normal. Right vetricular wall thickness was not assessed. Right ventricular systolic function is normal. Left Atrium: Left atrial size was normal in size. Right Atrium: Right atrial size was normal in size. Pericardium: Trivial pericardial effusion is present. Mitral Valve: The mitral valve is normal in structure. Trivial mitral valve regurgitation. Tricuspid Valve: The tricuspid valve is normal in structure. Tricuspid valve regurgitation is trivial. Aortic Valve: The aortic valve was not well visualized. Aortic valve regurgitation is not visualized. No aortic stenosis is present. Pulmonic Valve: The pulmonic valve was not well visualized. Pulmonic valve regurgitation is not visualized. Aorta: The aortic root is normal in size and structure. IAS/Shunts: The interatrial septum was not well visualized.  LEFT VENTRICLE PLAX 2D LVIDd:         4.70 cm  Diastology LVIDs:         3.30 cm  LV e' lateral:   5.33 cm/s LV PW:         1.20 cm  LV E/e' lateral: 11.6 LV IVS:        1.10 cm  LV e' medial:    6.74 cm/s LVOT diam:     2.00 cm  LV E/e' medial:  9.2 LV SV:         48 LV SV Index:   27 LVOT Area:     3.14 cm  RIGHT VENTRICLE RV S prime:     11.50 cm/s TAPSE (M-mode): 2.0 cm LEFT ATRIUM             Index       RIGHT ATRIUM  Index LA diam:        3.70 cm 2.08 cm/m  RA Area:     17.40 cm LA Vol (A2C):   32.6 ml 18.31 ml/m RA Volume:   42.20 ml  23.70 ml/m LA Vol (A4C):   45.6 ml 25.61 ml/m LA Biplane Vol: 41.4 ml 23.25 ml/m  AORTIC VALVE LVOT Vmax:   62.70 cm/s LVOT Vmean:  46.300 cm/s LVOT VTI:    0.154 m  AORTA Ao Root diam: 3.00 cm MITRAL VALVE MV Area (PHT): 3.83 cm    SHUNTS MV Decel Time: 198 msec    Systemic VTI:  0.15 m  MV E velocity: 61.70 cm/s  Systemic Diam: 2.00 cm MV A velocity: 85.70 cm/s MV E/A ratio:  0.72 Oswaldo Milian MD Electronically signed by Oswaldo Milian MD Signature Date/Time: 02/29/2020/1:51:24 PM    Final     Cardiac Studies   TTE 02/29/2020 1. Left ventricular ejection fraction, by estimation, is 45 to 50%. The  left ventricle has mildly decreased function. The left ventricle  demonstrates regional wall motion abnormalities (see scoring  diagram/findings for description). Inferior  hypokinesis. There is mild left ventricular hypertrophy. Left ventricular  diastolic parameters are indeterminate.  2. Right ventricular systolic function is normal. The right ventricular  size is normal.  3. The mitral valve is normal in structure. Trivial mitral valve  regurgitation.  4. The aortic valve was not well visualized. Aortic valve regurgitation  is not visualized. No aortic stenosis is present.   Patient Profile  Ms. Judy [name] is a 79 year old female with medical history of OSA on CPAP, recent COVID-19 infection, hypertension, diabetes, obesity who was admitted with chest pain for 1 day.  Cardiac enzymes are elevated.  EKG initially with minimal ST elevation that resolved.  Evaluated by interventional cardiology who felt this was not a STEMI.  Admitted for non-STEMI.  Assessment & Plan   1.  Non-STEMI -EKG with sinus bradycardia and now inferior Q waves.  Really unclear how she developed Q waves in a matter of 12 hours. -Continue aspirin heparin drip.  She remains on high intensity statin.  N.p.o. for left heart cath today. -No heart failure symptoms.  Telemetry stable. -Echocardiogram with inferior hypokinesis.  Suspect this will be a inferior MI. -She will continue nitro drip for now.  Chest pain-free this morning.  2.  Hypertension -Hold BP meds and while on nitro drip  3.  Hyperlipidemia -High intensity statin  4.  Diabetes -A1c 7.1 -Holding oral agents while  in-house -Sliding-scale insulin  FEN -IVF pre cath -NPO -dvt ppx: heparin  -code: full   For questions or updates, please contact [place] Please consult [email or web address] for contact info under   Time Spent with Patient: I have spent a total of 35 minutes with patient reviewing hospital notes, telemetry, EKGs, labs and examining the patient as well as establishing an assessment and plan that was discussed with the patient.  > 50% of time was spent in direct patient care.    Signed, Addison Naegeli. Audie Box, [place]  03/01/2020 8:13 AM

## 2020-03-02 ENCOUNTER — Ambulatory Visit: Payer: Medicare Other

## 2020-03-02 DIAGNOSIS — I6330 Cerebral infarction due to thrombosis of unspecified cerebral artery: Secondary | ICD-10-CM | POA: Insufficient documentation

## 2020-03-02 LAB — GLUCOSE, CAPILLARY
Glucose-Capillary: 90 mg/dL (ref 70–99)
Glucose-Capillary: 82 mg/dL (ref 70–99)
Glucose-Capillary: 115 mg/dL — ABNORMAL HIGH (ref 70–99)
Glucose-Capillary: 118 mg/dL — ABNORMAL HIGH (ref 70–99)
Glucose-Capillary: 96 mg/dL (ref 70–99)

## 2020-03-02 LAB — CBC
HCT: 35.2 % — ABNORMAL LOW (ref 36.0–46.0)
Hemoglobin: 11.4 g/dL — ABNORMAL LOW (ref 12.0–15.0)
MCH: 29.9 pg (ref 26.0–34.0)
MCHC: 32.4 g/dL (ref 30.0–36.0)
MCV: 92.4 fL (ref 80.0–100.0)
Platelets: 239 10*3/uL (ref 150–400)
RBC: 3.81 MIL/uL — ABNORMAL LOW (ref 3.87–5.11)
RDW: 13.2 % (ref 11.5–15.5)
WBC: 10.5 10*3/uL (ref 4.0–10.5)
nRBC: 0 % (ref 0.0–0.2)

## 2020-03-02 LAB — HEPARIN LEVEL (UNFRACTIONATED)
Heparin Unfractionated: 0.22 IU/mL — ABNORMAL LOW (ref 0.30–0.70)
Heparin Unfractionated: 0.77 IU/mL — ABNORMAL HIGH (ref 0.30–0.70)
Heparin Unfractionated: 0.48 IU/mL (ref 0.30–0.70)

## 2020-03-02 MED ORDER — PLASMA-LYTE 148 IV SOLN
INTRAVENOUS | Status: AC
  Administered 2020-03-03: 500 mL
  Filled 2020-03-02: qty 2.5

## 2020-03-02 MED ORDER — CHLORHEXIDINE GLUCONATE 0.12 % MT SOLN
15.0000 mL | Freq: Once | OROMUCOSAL | Status: AC
  Administered 2020-03-03: 06:00:00 15 mL via OROMUCOSAL
  Filled 2020-03-02: qty 15

## 2020-03-02 MED ORDER — SODIUM CHLORIDE 0.9 % IV SOLN
1.5000 g | INTRAVENOUS | Status: AC
  Administered 2020-03-03: 1.5 g via INTRAVENOUS
  Filled 2020-03-02: qty 1.5

## 2020-03-02 MED ORDER — VANCOMYCIN HCL 1250 MG/250ML IV SOLN
1250.0000 mg | INTRAVENOUS | Status: AC
  Administered 2020-03-03: 1250 mg via INTRAVENOUS
  Filled 2020-03-02: qty 250

## 2020-03-02 MED ORDER — SODIUM CHLORIDE 0.9 % IV BOLUS
500.0000 mL | Freq: Once | INTRAVENOUS | Status: AC
  Administered 2020-03-02: 500 mL via INTRAVENOUS

## 2020-03-02 MED ORDER — MILRINONE LACTATE IN DEXTROSE 20-5 MG/100ML-% IV SOLN
0.3000 ug/kg/min | INTRAVENOUS | Status: DC
  Filled 2020-03-02: qty 100

## 2020-03-02 MED ORDER — NOREPINEPHRINE 4 MG/250ML-% IV SOLN
0.0000 ug/min | INTRAVENOUS | Status: DC
  Filled 2020-03-02: qty 250

## 2020-03-02 MED ORDER — STROKE: EARLY STAGES OF RECOVERY BOOK
Freq: Once | Status: AC
  Filled 2020-03-02: qty 1

## 2020-03-02 MED ORDER — MANNITOL 20 % IV SOLN
Freq: Once | INTRAVENOUS | Status: DC
  Filled 2020-03-02: qty 13

## 2020-03-02 MED ORDER — TRANEXAMIC ACID (OHS) PUMP PRIME SOLUTION
2.0000 mg/kg | INTRAVENOUS | Status: DC
  Filled 2020-03-02: qty 1.63

## 2020-03-02 MED ORDER — BISACODYL 5 MG PO TBEC
5.0000 mg | DELAYED_RELEASE_TABLET | Freq: Once | ORAL | Status: AC
  Administered 2020-03-02: 5 mg via ORAL
  Filled 2020-03-02: qty 1

## 2020-03-02 MED ORDER — INSULIN REGULAR(HUMAN) IN NACL 100-0.9 UT/100ML-% IV SOLN
INTRAVENOUS | Status: AC
  Administered 2020-03-03: 1 [IU]/h via INTRAVENOUS
  Filled 2020-03-02: qty 100

## 2020-03-02 MED ORDER — TRANEXAMIC ACID (OHS) BOLUS VIA INFUSION
15.0000 mg/kg | INTRAVENOUS | Status: DC
  Filled 2020-03-02: qty 1223

## 2020-03-02 MED ORDER — CHLORHEXIDINE GLUCONATE CLOTH 2 % EX PADS
6.0000 | MEDICATED_PAD | Freq: Once | CUTANEOUS | Status: AC
  Administered 2020-03-02: 6 via TOPICAL

## 2020-03-02 MED ORDER — NITROGLYCERIN IN D5W 200-5 MCG/ML-% IV SOLN
2.0000 ug/min | INTRAVENOUS | Status: AC
  Administered 2020-03-03: 5 ug/min via INTRAVENOUS
  Filled 2020-03-02: qty 250

## 2020-03-02 MED ORDER — TEMAZEPAM 15 MG PO CAPS
15.0000 mg | ORAL_CAPSULE | Freq: Once | ORAL | Status: AC | PRN
  Administered 2020-03-02: 15 mg via ORAL
  Filled 2020-03-02: qty 1

## 2020-03-02 MED ORDER — EPINEPHRINE HCL 5 MG/250ML IV SOLN IN NS
0.0000 ug/min | INTRAVENOUS | Status: DC
  Filled 2020-03-02: qty 250

## 2020-03-02 MED ORDER — TRANEXAMIC ACID 1000 MG/10ML IV SOLN
1.5000 mg/kg/h | INTRAVENOUS | Status: AC
  Administered 2020-03-03: 1.5 mg/kg/h via INTRAVENOUS
  Filled 2020-03-02: qty 25

## 2020-03-02 MED ORDER — PHENYLEPHRINE HCL-NACL 20-0.9 MG/250ML-% IV SOLN
30.0000 ug/min | INTRAVENOUS | Status: AC
  Administered 2020-03-03: 30 ug/min via INTRAVENOUS
  Filled 2020-03-02: qty 250

## 2020-03-02 MED ORDER — DEXMEDETOMIDINE HCL IN NACL 400 MCG/100ML IV SOLN
0.1000 ug/kg/h | INTRAVENOUS | Status: AC
  Administered 2020-03-03: .3 ug/kg/h via INTRAVENOUS
  Filled 2020-03-02: qty 100

## 2020-03-02 MED ORDER — SODIUM CHLORIDE 0.9 % IV SOLN
750.0000 mg | INTRAVENOUS | Status: DC
  Filled 2020-03-02: qty 750

## 2020-03-02 MED ORDER — POTASSIUM CHLORIDE 2 MEQ/ML IV SOLN
80.0000 meq | INTRAVENOUS | Status: DC
  Filled 2020-03-02: qty 40

## 2020-03-02 MED ORDER — SODIUM CHLORIDE 0.9 % IV SOLN
INTRAVENOUS | Status: DC
  Filled 2020-03-02: qty 30

## 2020-03-02 MED ORDER — METOPROLOL TARTRATE 12.5 MG HALF TABLET
12.5000 mg | ORAL_TABLET | Freq: Once | ORAL | Status: AC
  Administered 2020-03-03: 12.5 mg via ORAL
  Filled 2020-03-02: qty 1

## 2020-03-02 MED ORDER — MAGNESIUM SULFATE 50 % IJ SOLN
40.0000 meq | INTRAMUSCULAR | Status: DC
  Filled 2020-03-02: qty 9.85

## 2020-03-02 NOTE — Progress Notes (Signed)
STROKE TEAM PROGRESS NOTE   INTERVAL HISTORY I have personally reviewed history of presenting illness, electronic medical records and imaging films in PACS.  Patient is sitting up comfortably in bed.  She states she is feeling a lot better.  She has no complaints or deficits.  She had altered mental status following cardiac cath which has improved an MRI scan shows a tiny incidental right cerebellar infarct which was likely caused by the cardiac cath.  She is back on IV heparin and cardiac surgery is planned in the next few days. Carotid ultrasound shows no significant extracranial stenosis.  LDL cholesterol is optimal at 30 mg percent and hemoglobin A1c is slightly high at 7.1 done on 02/09/2020.  Transthoracic echo done On 02/29/2020 shows diminished ejection fraction of 45 to 50% Vitals:   03/02/20 0800 03/02/20 1000 03/02/20 1200 03/02/20 1441  BP: 116/61 95/67 93/82  108/61  Pulse:    69  Resp: 18 11 (!) 28 19  Temp:      TempSrc:      SpO2:    92%  Weight:      Height:        CBC:  Recent Labs  Lab 02/29/20 1037 02/29/20 1037 03/01/20 0351 03/02/20 0259  WBC 9.4   < > 13.1* 10.5  NEUTROABS 7.8*  --   --   --   HGB 14.0   < > 12.2 11.4*  HCT 43.9   < > 37.6 35.2*  MCV 93.8   < > 92.2 92.4  PLT 285   < > 292 239   < > = values in this interval not displayed.    Basic Metabolic Panel:  Recent Labs  Lab 02/29/20 1037 03/01/20 0351  NA 139 138  K 4.3 3.9  CL 100 101  CO2 26 28  GLUCOSE 144* 119*  BUN 18 20  CREATININE 1.18* 1.16*  CALCIUM 10.0 9.2   Lipid Panel:     Component Value Date/Time   CHOL 89 03/01/2020 0351   CHOL 184 06/19/2018 0958   TRIG 101 03/01/2020 0351   HDL 39 (L) 03/01/2020 0351   HDL 45 06/19/2018 0958   CHOLHDL 2.3 03/01/2020 0351   VLDL 20 03/01/2020 0351   LDLCALC 30 03/01/2020 0351   LDLCALC 107 (H) 06/19/2018 0958   HgbA1c:  Lab Results  Component Value Date   HGBA1C 7.1 (H) 02/09/2020   Urine Drug Screen: No results found for:  LABOPIA, COCAINSCRNUR, LABBENZ, AMPHETMU, THCU, LABBARB  Alcohol Level No results found for: ETH  IMAGING past 24 hours CT ANGIO HEAD W OR WO CONTRAST  Result Date: 03/01/2020 CLINICAL DATA:  Left-sided weakness. Expressive aphasia. Altered mental status. EXAM: CT ANGIOGRAPHY HEAD AND NECK TECHNIQUE: Multidetector CT imaging of the head and neck was performed using the standard protocol during bolus administration of intravenous contrast. Multiplanar CT image reconstructions and MIPs were obtained to evaluate the vascular anatomy. Carotid stenosis measurements (when applicable) are obtained utilizing NASCET criteria, using the distal internal carotid diameter as the denominator. CONTRAST:  31mL OMNIPAQUE IOHEXOL 350 MG/ML SOLN COMPARISON:  CT head without contrast of the same day. FINDINGS: CTA NECK FINDINGS Aortic arch: Common origin of the left common carotid artery and innominate artery is noted. No significant vascular calcifications are present at the arch. There is no aneurysm or stenosis. Right carotid system: The right common carotid artery is within normal limits. Atherosclerotic changes are noted at the proximal right ECA. The cervical right ICA is normal. Left  carotid system: The left common carotid artery is within normal limits. Atherosclerotic changes are present bifurcation without significant stenosis. Cervical left ICA is normal. Vertebral arteries: The left vertebral artery is dominant. Both vertebral arteries originate from the subclavian arteries without significant stenosis. No significant stenosis is present in either vertebral artery in the neck. Skeleton: Multilevel degenerative changes are present cervical spine with uncovertebral spurring at C4-5, C5-6, and C6-7. Vertebral body heights are maintained. No focal lytic or blastic lesions are present. Other neck: The thyroid is mildly enlarged and heterogeneous without a dominant nodule. No follow-up recommended (ref: J Am Coll Radiol.  2015 Feb;12(2): 143-50). Upper chest: Mild dependent atelectasis is present in both lungs. No focal nodule, mass, or airspace disease is present. The thoracic inlet is within normal limits. Review of the MIP images confirms the above findings CTA HEAD FINDINGS Anterior circulation: Atherosclerotic calcifications are present within the cavernous internal carotid arteries bilaterally without a significant stenosis relative to the more distal vessel. ICA termini are normal bilaterally. The A1 and M1 segments are normal. The anterior communicating artery is patent. MCA bifurcations are intact. The ACA and MCA branch vessels are within normal limits. Posterior circulation: Atherosclerotic calcifications are present at the dural margin of the vertebral arteries bilaterally without significant stenosis relative to the more distal vessels. PICA origins are visualized and normal. The vertebrobasilar junction is normal. Basilar artery is normal. Both posterior cerebral arteries originate from the basilar tip. Mild narrowing is present at the proximal left P2 segment. PCA branch vessels are within normal limits otherwise. Venous sinuses: The dural sinuses are patent. The straight sinus and deep cerebral veins are intact. Cortical veins are unremarkable. No vascular malformations are present. Anatomic variants: None Review of the MIP images confirms the above findings IMPRESSION: 1. No emergent large vessel occlusion. 2. Mild narrowing of the proximal left P2 segment without significant stenosis elsewhere in the posterior circulation. 3. MCA branch vessels are normal bilaterally. 4. Minimal atherosclerotic changes at the carotid bifurcations bilaterally without significant stenosis. 5. Atherosclerotic changes at the dural margin of the vertebral arteries bilaterally without significant stenosis. 6. Atherosclerotic changes within the cavernous internal carotid arteries bilaterally without a significant stenosis relative to the  more distal vessels. 7. No significant proximal stenosis, aneurysm, or branch vessel occlusion within the Circle of Willis. 8. Multilevel spondylosis of the cervical spine. These results were called by telephone at the time of interpretation on 03/01/2020 at 5:20pm to provider [facility], who verbally acknowledged these results. Electronically Signed   By: San Morelle M.D.   On: 03/01/2020 17:31   CT ANGIO NECK W OR WO CONTRAST  Result Date: 03/01/2020 CLINICAL DATA:  Left-sided weakness. Expressive aphasia. Altered mental status. EXAM: CT ANGIOGRAPHY HEAD AND NECK TECHNIQUE: Multidetector CT imaging of the head and neck was performed using the standard protocol during bolus administration of intravenous contrast. Multiplanar CT image reconstructions and MIPs were obtained to evaluate the vascular anatomy. Carotid stenosis measurements (when applicable) are obtained utilizing NASCET criteria, using the distal internal carotid diameter as the denominator. CONTRAST:  36mL OMNIPAQUE IOHEXOL 350 MG/ML SOLN COMPARISON:  CT head without contrast of the same day. FINDINGS: CTA NECK FINDINGS Aortic arch: Common origin of the left common carotid artery and innominate artery is noted. No significant vascular calcifications are present at the arch. There is no aneurysm or stenosis. Right carotid system: The right common carotid artery is within normal limits. Atherosclerotic changes are noted at the proximal right ECA. The cervical  right ICA is normal. Left carotid system: The left common carotid artery is within normal limits. Atherosclerotic changes are present bifurcation without significant stenosis. Cervical left ICA is normal. Vertebral arteries: The left vertebral artery is dominant. Both vertebral arteries originate from the subclavian arteries without significant stenosis. No significant stenosis is present in either vertebral artery in the neck. Skeleton: Multilevel degenerative changes are present cervical  spine with uncovertebral spurring at C4-5, C5-6, and C6-7. Vertebral body heights are maintained. No focal lytic or blastic lesions are present. Other neck: The thyroid is mildly enlarged and heterogeneous without a dominant nodule. No follow-up recommended (ref: J Am Coll Radiol. 2015 Feb;12(2): 143-50). Upper chest: Mild dependent atelectasis is present in both lungs. No focal nodule, mass, or airspace disease is present. The thoracic inlet is within normal limits. Review of the MIP images confirms the above findings CTA HEAD FINDINGS Anterior circulation: Atherosclerotic calcifications are present within the cavernous internal carotid arteries bilaterally without a significant stenosis relative to the more distal vessel. ICA termini are normal bilaterally. The A1 and M1 segments are normal. The anterior communicating artery is patent. MCA bifurcations are intact. The ACA and MCA branch vessels are within normal limits. Posterior circulation: Atherosclerotic calcifications are present at the dural margin of the vertebral arteries bilaterally without significant stenosis relative to the more distal vessels. PICA origins are visualized and normal. The vertebrobasilar junction is normal. Basilar artery is normal. Both posterior cerebral arteries originate from the basilar tip. Mild narrowing is present at the proximal left P2 segment. PCA branch vessels are within normal limits otherwise. Venous sinuses: The dural sinuses are patent. The straight sinus and deep cerebral veins are intact. Cortical veins are unremarkable. No vascular malformations are present. Anatomic variants: None Review of the MIP images confirms the above findings IMPRESSION: 1. No emergent large vessel occlusion. 2. Mild narrowing of the proximal left P2 segment without significant stenosis elsewhere in the posterior circulation. 3. MCA branch vessels are normal bilaterally. 4. Minimal atherosclerotic changes at the carotid bifurcations  bilaterally without significant stenosis. 5. Atherosclerotic changes at the dural margin of the vertebral arteries bilaterally without significant stenosis. 6. Atherosclerotic changes within the cavernous internal carotid arteries bilaterally without a significant stenosis relative to the more distal vessels. 7. No significant proximal stenosis, aneurysm, or branch vessel occlusion within the Circle of Willis. 8. Multilevel spondylosis of the cervical spine. These results were called by telephone at the time of interpretation on 03/01/2020 at 5:20pm to provider [facility], who verbally acknowledged these results. Electronically Signed   By: San Morelle M.D.   On: 03/01/2020 17:31   MR BRAIN WO CONTRAST  Result Date: 03/01/2020 CLINICAL DATA:  Altered mentation following cardiac catheterization. Expressive aphasia and right facial droop. EXAM: MRI HEAD WITHOUT CONTRAST TECHNIQUE: Multiplanar, multiecho pulse sequences of the brain and surrounding structures were obtained without intravenous contrast. COMPARISON:  Head CT 03/01/2020 FINDINGS: The study is mildly motion degraded, and coronal T2 weighted imaging was not submitted. Brain: There is a 5 mm acute right cerebellar infarct. No acute supratentorial infarct is present. Patchy T2 hyperintensities in the cerebral white matter bilaterally are nonspecific but compatible with moderate chronic small vessel ischemic disease. There is mild-to-moderate cerebral atrophy. No mass, midline shift, intracranial hemorrhage, or extra-axial fluid collection is identified. Vascular: Major intracranial vascular flow voids are preserved. Skull and upper cervical spine: Unremarkable bone marrow signal. Sinuses/Orbits: Unremarkable orbits. Paranasal sinuses and mastoid air cells are clear. Other: None. IMPRESSION: 1. Subcentimeter acute  right cerebellar infarct. 2. Moderate chronic small vessel ischemic disease and cerebral atrophy. Electronically Signed   By: Logan Bores  M.D.   On: 03/01/2020 22:09   VAS US DOPPLER PRE CABG  Result Date: 03/01/2020 PREOPERATIVE VASCULAR EVALUATION  Indications:      Pre-CABG. Limitations:      Patient body habitus, TR band Comparison Study: No prior exam. Performing Technologist: Baldwin Crown RVT, RDMS  Examination Guidelines: A complete evaluation includes B-mode imaging, spectral Doppler, color Doppler, and power Doppler as needed of all accessible portions of each vessel. Bilateral testing is considered an integral part of a complete examination. Limited examinations for reoccurring indications may be performed as noted.  Right Carotid Findings: +----------+--------+--------+--------+------------+--------+           PSV cm/sEDV cm/sStenosisDescribe    Comments +----------+--------+--------+--------+------------+--------+ CCA Prox  57      11                                   +----------+--------+--------+--------+------------+--------+ CCA Distal60      12                                   +----------+--------+--------+--------+------------+--------+ ICA Prox  48      14      1-39%   heterogenous         +----------+--------+--------+--------+------------+--------+ ICA Distal50      13                                   +----------+--------+--------+--------+------------+--------+ ECA       91      21              calcific             +----------+--------+--------+--------+------------+--------+ Portions of this table do not appear on this page. +----------+--------+-------+----------------+------------+           PSV cm/sEDV cmsDescribe        Arm Pressure +----------+--------+-------+----------------+------------+ Subclavian69             Multiphasic, [id number]          +----------+--------+-------+----------------+------------+ +---------+--------+--+--------+-+---------+ VertebralPSV cm/s31EDV cm/s9Antegrade +---------+--------+--+--------+-+---------+ Left Carotid Findings:  +----------+--------+--------+--------+-------------------------+--------+           PSV cm/sEDV cm/sStenosisDescribe                 Comments +----------+--------+--------+--------+-------------------------+--------+ CCA Prox  71      11                                                +----------+--------+--------+--------+-------------------------+--------+ CCA Distal71      13              heterogenous                      +----------+--------+--------+--------+-------------------------+--------+ ICA Prox  68      21              calcific and heterogenous         +----------+--------+--------+--------+-------------------------+--------+ ICA Distal66      18                                                +----------+--------+--------+--------+-------------------------+--------+  ECA       70      17                                                +----------+--------+--------+--------+-------------------------+--------+ +----------+--------+--------+----------------+------------+ SubclavianPSV cm/sEDV cm/sDescribe        Arm Pressure +----------+--------+--------+----------------+------------+           82              Multiphasic, [id number]          +----------+--------+--------+----------------+------------+ +---------+--------+--+--------+--+---------+ VertebralPSV cm/s48EDV cm/s10Antegrade +---------+--------+--+--------+--+---------+  ABI Findings: +--------+------------------+-----+---------+--------+ Right   Rt Pressure (mmHg)IndexWaveform Comment  +--------+------------------+-----+---------+--------+ [id number]                    triphasic         +--------+------------------+-----+---------+--------+ PTA     120               1.02 triphasic         +--------+------------------+-----+---------+--------+ DP      136               1.15 triphasic         +--------+------------------+-----+---------+--------+  +--------+------------------+-----+---------+-------+ Left    Lt Pressure (mmHg)IndexWaveform Comment +--------+------------------+-----+---------+-------+ [id number]                    triphasic        +--------+------------------+-----+---------+-------+ PTA     147               1.25 triphasic        +--------+------------------+-----+---------+-------+ DP      120               1.02 triphasic        +--------+------------------+-----+---------+-------+ +-------+---------------+----------------+ ABI/TBIToday's ABI/TBIPrevious ABI/TBI +-------+---------------+----------------+ Right  1.15                            +-------+---------------+----------------+ Left   1.25                            +-------+---------------+----------------+  Right Doppler Findings: +--------+--------+-----+---------+--------+ Site    PressureIndexDoppler  Comments +--------+--------+-----+---------+--------+ [id number]          triphasic         +--------+--------+-----+---------+--------+ Radial               triphasic         +--------+--------+-----+---------+--------+ Ulnar                triphasic         +--------+--------+-----+---------+--------+  Left Doppler Findings: +--------+--------+-----+---------+--------+ Site    PressureIndexDoppler  Comments +--------+--------+-----+---------+--------+ [id number]          triphasic         +--------+--------+-----+---------+--------+ Radial               triphasic         +--------+--------+-----+---------+--------+ Ulnar                triphasic         +--------+--------+-----+---------+--------+  Summary: Right Carotid: Velocities in the right ICA are consistent with a 1-39% stenosis. Left Carotid: Velocities in the left ICA are consistent with a 1-39% stenosis. Vertebrals:  Bilateral vertebral arteries demonstrate antegrade flow. Subclavians: Normal flow hemodynamics  were seen in bilateral  subclavian              arteries. Right ABI: Resting right ankle-brachial index is within normal range. No evidence of significant right lower extremity arterial disease. Left ABI: Resting left ankle-brachial index is within normal range. No evidence of significant left lower extremity arterial disease. Right Upper Extremity: UTO due to TR band. Left Upper Extremity: Doppler waveforms remain within normal limits with left radial compression. Doppler waveforms decrease <50% with left ulnar compression.  Electronically signed by Deitra Mayo MD on 03/01/2020 at 4:59:06 PM.    Final     PHYSICAL EXAM pleasant obese elderly Caucasian lady sitting up comfortably in bed.  Not in distress. Neurological Exam ;  Awake  Alert oriented x 3. Normal speech and language.eye movements full without nystagmus.fundi were not visualized. Vision acuity and fields appear normal. Hearing is normal. Palatal movements are normal. Face symmetric. Tongue midline. Normal strength, tone, reflexes and coordination. Normal sensation. Gait deferred.  ASSESSMENT/PLAN Judy [name] is a 79 y.o. female with history of sleep apnea, leg edema, hypertension, diabetes admitted for CP, elevated cardiac enzymes. S/p cardiac cath developed word finding difficulties, altered mentation.   Coronary artery disease NSTEMI  On IV heparin  Plans for  CABG  Need neuro clearance for heparin during procedure    Stroke:   Incidental tiny R cerebellar infarct post cardiac cath, not related to symptoms, embolic d/t cath  CT head No acute abnormality. Stable Small vessel disease and Atrophy.   CTA head & neck no ELVO. L P2 mild narrowing. B ICA bifurcation w/ minimal atherosclerosis. B VA atherosclerosis. B cavernous ICA atherosclerosis. Multilevel spondylosis of CSpine.   MRI  Subcentimeter R cerebellar infarct. Moderate small vessel disease and atrophy  2D Echo EF 45-50%. No source of embolus   LDL 30  HgbA1c 7.1  IV  heparin for VTE prophylaxis  No antithrombotic prior to admission, now on aspirin 81 mg daily and heparin IV.   Therapy recommendations:  Recommend PT and OT evals post CABG  Disposition:  pending   Hypertension  Stable on the low side . Permissive hypertension (OK if < 220/120) but gradually normalize in 5-7 days . Long-term BP goal normotensive  Hyperlipidemia  Home meds:  liptior 20   Now on lipitor 80  LDL 30, goal < 70  Continue statin at discharge  Diabetes type II Uncontrolled  HgbA1c 7.1, goal < 7.0  Other Stroke Risk Factors  Advanced age  Obesity, Body mass index is 35.09 kg/m., recommend weight loss, diet and exercise as appropriate   Obstructive sleep apnea  Hx COVD PNA 12/03/2019  Hospital day # 1 Patient developed incidental small right cerebral infarct following cardiac cath and clinically she is asymptomatic from that.  She needs emergent cardiac surgery and I think the risk benefit of anticoagulation prior to and during the cardiac surgery is in favor of doing it since infarct is  tiny and incidental.  I had a long discussion with the patient, with Dr. Davina Poke and answered questions.  Greater than 50% time during this 35-minute visit was spent in counseling and coordination of care and answering questions Antony Contras, MD To contact Stroke Continuity provider, please refer to [email or web address] After hours, contact General Neurology

## 2020-03-02 NOTE — Progress Notes (Signed)
ANTICOAGULATION CONSULT NOTE - Follow Up Consult  Pharmacy Consult for heparin Indication: chest pain/ACS  No Known Allergies  Patient Measurements: Height: 5' (152.4 cm) Weight: 179 lb 10.8 oz (81.5 kg) IBW/kg (Calculated) : 45.5 Heparin Dosing Weight: 64.2 kg  Vital Signs: Temp: 98.5 F (36.9 C) (03/31 0345) Temp Source: Oral (03/31 0345) BP: 87/63 (03/31 0348) Pulse Rate: 70 (03/31 0348)  Labs: Recent Labs    02/29/20 1037 02/29/20 1037 02/29/20 1309 02/29/20 1732 02/29/20 2137 03/01/20 0351 03/01/20 0650 03/01/20 0832 03/01/20 1010 03/02/20 0259  HGB 14.0   < >  --   --   --  12.2  --   --   --  11.4*  HCT 43.9  --   --   --   --  37.6  --   --   --  35.2*  PLT 285  --   --   --   --  292  --   --   --  239  HEPARINUNFRC  --   --   --   --  0.12*  --  0.15*  --   --  0.22*  CREATININE 1.18*  --   --   --   --  1.16*  --   --   --   --   TROPONINIHS 61*  --    < > 2,127*  --   --   --  13,775* 12,678*  --    < > = values in this interval not displayed.    Estimated Creatinine Clearance: 38.4 mL/min (A) (by C-G formula based on SCr of 1.16 mg/dL (H)).   Medical History: Past Medical History:  Diagnosis Date  . Anxiety   . Back pain   . Complication of anesthesia 2003   gallbladder surgery-resp. arrest-stop operation and pt. went to ICU - patient stated she has back surgery since then and had no trouble at all  . Depression   . Diabetes ([place])   . Dyspnea   . Gout   . History of kidney stones   . Hypertension   . Leg edema   . Pancreatitis, chronic ([place]) 03/2016  . Pneumonia due to COVID-19 virus 12/03/2019  . Sleep apnea     Assessment:  79 yr old female presented to the ED with CP and elevated troponin; pharmacy was consulted to start IV heparin. Pt is being treated medically for NSTEMI. Pt was not on anticoagulation PTA.  For cath today  New CVA post cath  Heparin level low this AM HgB 11.4  Goal of Therapy:  Heparin level 0.3-0.7  units/ml Monitor platelets by anticoagulation protocol: Yes   Plan:  Increase heparin to 1400 units / hr Heparin level in 8 hours  Thank you Anette Guarneri, PharmD

## 2020-03-02 NOTE — Progress Notes (Signed)
ANTICOAGULATION CONSULT NOTE - Follow Up Consult  Pharmacy Consult for heparin Indication: chest pain/ACS  No Known Allergies  Patient Measurements: Height: 5' (152.4 cm) Weight: 179 lb 10.8 oz (81.5 kg) IBW/kg (Calculated) : 45.5 Heparin Dosing Weight: 64.2 kg  Vital Signs: Temp: 98.5 F (36.9 C) (03/31 2128) Temp Source: Oral (03/31 2128) BP: 113/60 (03/31 2128) Pulse Rate: 74 (03/31 2128)  Labs: Recent Labs    02/29/20 1037 02/29/20 1309 02/29/20 1732 02/29/20 2137 03/01/20 0351 03/01/20 0650 03/01/20 0832 03/01/20 1010 03/02/20 0259 03/02/20 1258 03/02/20 2126  HGB 14.0   < >  --   --  12.2  --   --   --  11.4*  --   --   HCT 43.9  --   --   --  37.6  --   --   --  35.2*  --   --   PLT 285  --   --   --  292  --   --   --  239  --   --   HEPARINUNFRC  --   --   --    < >  --    < >  --   --  0.22* 0.77* 0.48  CREATININE 1.18*  --   --   --  1.16*  --   --   --   --   --   --   TROPONINIHS 61*   < > 2,127*  --   --   --  [id number]* 12,678*  --   --   --    < > = values in this interval not displayed.    Estimated Creatinine Clearance: 38.4 mL/min (A) (by C-G formula based on SCr of 1.16 mg/dL (H)).  Assessment: 79 yr old female presented to the ED with CP and elevated troponin; pharmacy was consulted to start IV heparin.  Pt was not on anticoagulation PTA.  She is noted with New CVA post cath. Plans for CABG.   HL 0.48 and therapeutic on 1300 ml/hr. Planned for CABG tomorrow.   Goal of Therapy:  Heparin level 0.3-0.5 (due to CVA) Monitor platelets by anticoagulation protocol: Yes   Plan:  Continue heparin 1300 units / hr Monitor daily HL, CBC/plt Monitor for signs/symptoms of bleeding   Benetta Spar, PharmD, BCPS, BCCP Clinical Pharmacist  Please check AMION for all [place] phone numbers After 10:00 PM, call [place] [phone]

## 2020-03-02 NOTE — Progress Notes (Signed)
Blood pressure after the bolus has gone up to 96/58.  Will continue to monitor.  Lupita Dawn, RN

## 2020-03-02 NOTE — Progress Notes (Signed)
Pt's BP dropped down to 87/63.  Informed the on call cardiologist.  Ordered to stop the Nitro drip and to give a one time 500 cc bolus of normal saline.  Lupita Dawn, RN

## 2020-03-02 NOTE — Progress Notes (Signed)
Cardiology Progress Note  Patient ID: Judy [name] MRN: [id number] DOB: January 15, 1941 Date of Encounter: 03/02/2020  Primary Cardiologist: No primary care provider on file.  Subjective  Confusion yesterday. CT head negative. Neurology recommended MRI which showed subcentimeter cerebellar infarct.   She is much improved today. Following commands without issues. States she was confused yesterday. Reports no CP or SOB at the time of my examination.   ROS:  All other ROS reviewed and negative. Pertinent positives noted in the HPI.     Inpatient Medications  Scheduled Meds: . aspirin EC  81 mg Oral Daily  . atorvastatin  80 mg Oral q1800  . metoprolol tartrate  12.5 mg Oral BID  . sodium chloride flush  3 mL Intravenous Q12H   Continuous Infusions: . sodium chloride    . heparin 1,400 Units/hr (03/02/20 0430)  . nitroGLYCERIN Stopped (03/02/20 0309)   PRN Meds: sodium chloride, acetaminophen, morphine injection, ondansetron (ZOFRAN) IV, oxyCODONE-acetaminophen, sodium chloride flush   Vital Signs   Vitals:   03/02/20 0345 03/02/20 0348 03/02/20 0531 03/02/20 0600  BP: (!) 77/62 (!) 87/63 (!) 96/58 (!) 99/50  Pulse: 72 70 69   Resp: 20 13 11 10   Temp: 98.5 F (36.9 C)  98.5 F (36.9 C)   TempSrc: Oral  Oral   SpO2: 97% 97% 97%   Weight:      Height:        Intake/Output Summary (Last 24 hours) at 03/02/2020 0817 Last data filed at 03/02/2020 0412 Gross per 24 hour  Intake 748.48 ml  Output --  Net 748.48 ml   Last 3 Weights 03/01/2020 02/29/2020 02/29/2020  Weight (lbs) 179 lb 10.8 oz 178 lb 12.8 oz 179 lb  Weight (kg) 81.5 kg 81.103 kg 81.194 kg      Telemetry  Overnight telemetry shows sinus bradycardia heart rate 50-60, which I personally reviewed.   ECG  The most recent ECG shows sinus bradycardia, inferior Q waves noted, which I personally reviewed.   Physical Exam   Vitals:   03/02/20 0345 03/02/20 0348 03/02/20 0531 03/02/20 0600  BP: (!) 77/62 (!) 87/63  (!) 96/58 (!) 99/50  Pulse: 72 70 69   Resp: 20 13 11 10   Temp: 98.5 F (36.9 C)  98.5 F (36.9 C)   TempSrc: Oral  Oral   SpO2: 97% 97% 97%   Weight:      Height:         Intake/Output Summary (Last 24 hours) at 03/02/2020 0817 Last data filed at 03/02/2020 0412 Gross per 24 hour  Intake 748.48 ml  Output --  Net 748.48 ml    Last 3 Weights 03/01/2020 02/29/2020 02/29/2020  Weight (lbs) 179 lb 10.8 oz 178 lb 12.8 oz 179 lb  Weight (kg) 81.5 kg 81.103 kg 81.194 kg    Body mass index is 35.09 kg/m.   General: Well nourished, well developed, in no acute distress Head: Atraumatic, normal size  Eyes: PEERLA, EOMI  Neck: Supple, no JVD Endocrine: No thryomegaly Cardiac: Normal S1, S2; RRR; no murmurs, rubs, or gallops Lungs: Clear to auscultation bilaterally, no wheezing, rhonchi or rales  Abd: Soft, nontender, no hepatomegaly  Ext: No edema, pulses 2+ Musculoskeletal: No deformities, BUE and BLE strength normal and equal Skin: Warm and dry, no rashes   Neuro: Alert and oriented to person, place, time, and situation, CNII-XII grossly intact, no focal deficits  Psych: Normal mood and affect   Labs  High Sensitivity Troponin:   Recent  Labs  Lab 02/29/20 1037 02/29/20 1309 02/29/20 1732 03/01/20 0832 03/01/20 1010  TROPONINIHS 61* 302* 2,127* 13,775* 12,678*     Cardiac EnzymesNo results for input(s): TROPONINI in the last 168 hours. No results for input(s): TROPIPOC in the last 168 hours.  Chemistry Recent Labs  Lab 02/29/20 1037 03/01/20 0351  NA 139 138  K 4.3 3.9  CL 100 101  CO2 26 28  GLUCOSE 144* 119*  BUN 18 20  CREATININE 1.18* 1.16*  CALCIUM 10.0 9.2  PROT 7.1  --   ALBUMIN 4.1  --   AST 20  --   ALT 23  --   ALKPHOS 75  --   BILITOT 0.5  --   GFRNONAA 44* 45*  GFRAA 52* 53*  ANIONGAP 13 9    Hematology Recent Labs  Lab 02/29/20 1037 03/01/20 0351 03/02/20 0259  WBC 9.4 13.1* 10.5  RBC 4.68 4.08 3.81*  HGB 14.0 12.2 11.4*  HCT 43.9 37.6  35.2*  MCV 93.8 92.2 92.4  MCH 29.9 29.9 29.9  MCHC 31.9 32.4 32.4  RDW 13.2 13.2 13.2  PLT 285 292 239   BNPNo results for input(s): BNP, PROBNP in the last 168 hours.  DDimer  Recent Labs  Lab 02/29/20 1037  DDIMER 0.57*     Radiology  CT ANGIO HEAD W OR WO CONTRAST  Result Date: 03/01/2020 CLINICAL DATA:  Left-sided weakness. Expressive aphasia. Altered mental status. EXAM: CT ANGIOGRAPHY HEAD AND NECK TECHNIQUE: Multidetector CT imaging of the head and neck was performed using the standard protocol during bolus administration of intravenous contrast. Multiplanar CT image reconstructions and MIPs were obtained to evaluate the vascular anatomy. Carotid stenosis measurements (when applicable) are obtained utilizing NASCET criteria, using the distal internal carotid diameter as the denominator. CONTRAST:  61mL OMNIPAQUE IOHEXOL 350 MG/ML SOLN COMPARISON:  CT head without contrast of the same day. FINDINGS: CTA NECK FINDINGS Aortic arch: Common origin of the left common carotid artery and innominate artery is noted. No significant vascular calcifications are present at the arch. There is no aneurysm or stenosis. Right carotid system: The right common carotid artery is within normal limits. Atherosclerotic changes are noted at the proximal right ECA. The cervical right ICA is normal. Left carotid system: The left common carotid artery is within normal limits. Atherosclerotic changes are present bifurcation without significant stenosis. Cervical left ICA is normal. Vertebral arteries: The left vertebral artery is dominant. Both vertebral arteries originate from the subclavian arteries without significant stenosis. No significant stenosis is present in either vertebral artery in the neck. Skeleton: Multilevel degenerative changes are present cervical spine with uncovertebral spurring at C4-5, C5-6, and C6-7. Vertebral body heights are maintained. No focal lytic or blastic lesions are present. Other  neck: The thyroid is mildly enlarged and heterogeneous without a dominant nodule. No follow-up recommended (ref: J Am Coll Radiol. 2015 Feb;12(2): 143-50). Upper chest: Mild dependent atelectasis is present in both lungs. No focal nodule, mass, or airspace disease is present. The thoracic inlet is within normal limits. Review of the MIP images confirms the above findings CTA HEAD FINDINGS Anterior circulation: Atherosclerotic calcifications are present within the cavernous internal carotid arteries bilaterally without a significant stenosis relative to the more distal vessel. ICA termini are normal bilaterally. The A1 and M1 segments are normal. The anterior communicating artery is patent. MCA bifurcations are intact. The ACA and MCA branch vessels are within normal limits. Posterior circulation: Atherosclerotic calcifications are present at the dural margin of the  vertebral arteries bilaterally without significant stenosis relative to the more distal vessels. PICA origins are visualized and normal. The vertebrobasilar junction is normal. Basilar artery is normal. Both posterior cerebral arteries originate from the basilar tip. Mild narrowing is present at the proximal left P2 segment. PCA branch vessels are within normal limits otherwise. Venous sinuses: The dural sinuses are patent. The straight sinus and deep cerebral veins are intact. Cortical veins are unremarkable. No vascular malformations are present. Anatomic variants: None Review of the MIP images confirms the above findings IMPRESSION: 1. No emergent large vessel occlusion. 2. Mild narrowing of the proximal left P2 segment without significant stenosis elsewhere in the posterior circulation. 3. MCA branch vessels are normal bilaterally. 4. Minimal atherosclerotic changes at the carotid bifurcations bilaterally without significant stenosis. 5. Atherosclerotic changes at the dural margin of the vertebral arteries bilaterally without significant stenosis. 6.  Atherosclerotic changes within the cavernous internal carotid arteries bilaterally without a significant stenosis relative to the more distal vessels. 7. No significant proximal stenosis, aneurysm, or branch vessel occlusion within the Circle of Willis. 8. Multilevel spondylosis of the cervical spine. These results were called by telephone at the time of interpretation on 03/01/2020 at 5:20pm to provider [facility], who verbally acknowledged these results. Electronically Signed   By: San Morelle M.D.   On: 03/01/2020 17:31   CT HEAD WO CONTRAST  Result Date: 03/01/2020 CLINICAL DATA:  Altered mental status. EXAM: CT HEAD WITHOUT CONTRAST TECHNIQUE: Contiguous axial images were obtained from the base of the skull through the vertex without intravenous contrast. COMPARISON:  Head CT 12/09/2019 FINDINGS: Brain: Stable degree of generalized atrophy and chronic small vessel ischemia. No intracranial hemorrhage, mass effect, or midline shift. No hydrocephalus. The basilar cisterns are patent. No evidence of territorial infarct or acute ischemia. No extra-axial or intracranial fluid collection. Vascular: Atherosclerosis of skull base vasculature. No hyperdense vessel. Generalized increased density of intracranial vasculature is unchanged from prior. Skull: No fracture or focal lesion. Stable arachnoid granulation in the left frontal bone. Sinuses/Orbits: Paranasal sinuses and mastoid air cells are clear. The visualized orbits are unremarkable. Other: None. IMPRESSION: 1. No acute intracranial abnormality. 2. Stable atrophy and chronic small vessel ischemia since January 2021. Electronically Signed   By: Keith Rake M.D.   On: 03/01/2020 14:51   CT ANGIO NECK W OR WO CONTRAST  Result Date: 03/01/2020 CLINICAL DATA:  Left-sided weakness. Expressive aphasia. Altered mental status. EXAM: CT ANGIOGRAPHY HEAD AND NECK TECHNIQUE: Multidetector CT imaging of the head and neck was performed using the standard  protocol during bolus administration of intravenous contrast. Multiplanar CT image reconstructions and MIPs were obtained to evaluate the vascular anatomy. Carotid stenosis measurements (when applicable) are obtained utilizing NASCET criteria, using the distal internal carotid diameter as the denominator. CONTRAST:  84mL OMNIPAQUE IOHEXOL 350 MG/ML SOLN COMPARISON:  CT head without contrast of the same day. FINDINGS: CTA NECK FINDINGS Aortic arch: Common origin of the left common carotid artery and innominate artery is noted. No significant vascular calcifications are present at the arch. There is no aneurysm or stenosis. Right carotid system: The right common carotid artery is within normal limits. Atherosclerotic changes are noted at the proximal right ECA. The cervical right ICA is normal. Left carotid system: The left common carotid artery is within normal limits. Atherosclerotic changes are present bifurcation without significant stenosis. Cervical left ICA is normal. Vertebral arteries: The left vertebral artery is dominant. Both vertebral arteries originate from the subclavian arteries without significant stenosis.  No significant stenosis is present in either vertebral artery in the neck. Skeleton: Multilevel degenerative changes are present cervical spine with uncovertebral spurring at C4-5, C5-6, and C6-7. Vertebral body heights are maintained. No focal lytic or blastic lesions are present. Other neck: The thyroid is mildly enlarged and heterogeneous without a dominant nodule. No follow-up recommended (ref: J Am Coll Radiol. 2015 Feb;12(2): 143-50). Upper chest: Mild dependent atelectasis is present in both lungs. No focal nodule, mass, or airspace disease is present. The thoracic inlet is within normal limits. Review of the MIP images confirms the above findings CTA HEAD FINDINGS Anterior circulation: Atherosclerotic calcifications are present within the cavernous internal carotid arteries bilaterally  without a significant stenosis relative to the more distal vessel. ICA termini are normal bilaterally. The A1 and M1 segments are normal. The anterior communicating artery is patent. MCA bifurcations are intact. The ACA and MCA branch vessels are within normal limits. Posterior circulation: Atherosclerotic calcifications are present at the dural margin of the vertebral arteries bilaterally without significant stenosis relative to the more distal vessels. PICA origins are visualized and normal. The vertebrobasilar junction is normal. Basilar artery is normal. Both posterior cerebral arteries originate from the basilar tip. Mild narrowing is present at the proximal left P2 segment. PCA branch vessels are within normal limits otherwise. Venous sinuses: The dural sinuses are patent. The straight sinus and deep cerebral veins are intact. Cortical veins are unremarkable. No vascular malformations are present. Anatomic variants: None Review of the MIP images confirms the above findings IMPRESSION: 1. No emergent large vessel occlusion. 2. Mild narrowing of the proximal left P2 segment without significant stenosis elsewhere in the posterior circulation. 3. MCA branch vessels are normal bilaterally. 4. Minimal atherosclerotic changes at the carotid bifurcations bilaterally without significant stenosis. 5. Atherosclerotic changes at the dural margin of the vertebral arteries bilaterally without significant stenosis. 6. Atherosclerotic changes within the cavernous internal carotid arteries bilaterally without a significant stenosis relative to the more distal vessels. 7. No significant proximal stenosis, aneurysm, or branch vessel occlusion within the Circle of Willis. 8. Multilevel spondylosis of the cervical spine. These results were called by telephone at the time of interpretation on 03/01/2020 at 5:20pm to provider [facility], who verbally acknowledged these results. Electronically Signed   By: San Morelle M.D.   On:  03/01/2020 17:31   CT ANGIO CHEST PE W OR WO CONTRAST  Result Date: 02/29/2020 CLINICAL DATA:  Shortness of breath, chest pain EXAM: CT ANGIOGRAPHY CHEST WITH CONTRAST TECHNIQUE: Multidetector CT imaging of the chest was performed using the standard protocol during bolus administration of intravenous contrast. Multiplanar CT image reconstructions and MIPs were obtained to evaluate the vascular anatomy. CONTRAST:  70mL OMNIPAQUE IOHEXOL 350 MG/ML SOLN COMPARISON:  02/29/2020, 01/20/2020 FINDINGS: Cardiovascular: This is a technically adequate evaluation of the pulmonary vasculature. No filling defects or pulmonary emboli. Stable mild cardiomegaly. Mild atherosclerosis of the aorta and coronary vasculature. Thoracic aorta is normal in caliber. Mediastinum/Nodes: No enlarged mediastinal, hilar, or axillary lymph nodes. Thyroid gland, trachea, and esophagus demonstrate no significant findings. Lungs/Pleura: No acute airspace disease, effusion, or pneumothorax. Central airways are patent. Upper Abdomen: No acute abnormality. Musculoskeletal: There are no acute or destructive bony lesions. Reconstructed images demonstrate no additional findings. Review of the MIP images confirms the above findings. IMPRESSION: 1. No evidence of pulmonary embolus. 2. No acute intrathoracic process. 3.  Aortic Atherosclerosis (ICD10-I70.0). Electronically Signed   By: Randa Ngo M.D.   On: 02/29/2020 15:42   MR  BRAIN WO CONTRAST  Result Date: 03/01/2020 CLINICAL DATA:  Altered mentation following cardiac catheterization. Expressive aphasia and right facial droop. EXAM: MRI HEAD WITHOUT CONTRAST TECHNIQUE: Multiplanar, multiecho pulse sequences of the brain and surrounding structures were obtained without intravenous contrast. COMPARISON:  Head CT 03/01/2020 FINDINGS: The study is mildly motion degraded, and coronal T2 weighted imaging was not submitted. Brain: There is a 5 mm acute right cerebellar infarct. No acute  supratentorial infarct is present. Patchy T2 hyperintensities in the cerebral white matter bilaterally are nonspecific but compatible with moderate chronic small vessel ischemic disease. There is mild-to-moderate cerebral atrophy. No mass, midline shift, intracranial hemorrhage, or extra-axial fluid collection is identified. Vascular: Major intracranial vascular flow voids are preserved. Skull and upper cervical spine: Unremarkable bone marrow signal. Sinuses/Orbits: Unremarkable orbits. Paranasal sinuses and mastoid air cells are clear. Other: None. IMPRESSION: 1. Subcentimeter acute right cerebellar infarct. 2. Moderate chronic small vessel ischemic disease and cerebral atrophy. Electronically Signed   By: Logan Bores M.D.   On: 03/01/2020 22:09   CARDIAC CATHETERIZATION  Result Date: 03/01/2020  Severe multivessel coronary disease  Segmental proximal to mid 90% LAD stenosis.  Distal flow is TIMI grade II.  Right coronary supplies LAD via septal perforator collaterals.  90% mid to distal LAD before the second obtuse marginal.  100% circumflex after the second obtuse marginal.  Total occlusion of distal RCA.  Left ventricular branch and PDA fill by collaterals from the left coronary.  Distal left main calcified with 30 to 40% narrowing  Basal inferior to mid inferior wall motion abnormality hypokinetic to akinetic.  EF 55%.  LVEDP 16 mmHg RECOMMENDATIONS:  T CTS consultation for coronary bypass surgery which should be done soon.  Resume IV heparin  Continue IV nitroglycerin  Patient to notify of recurrent chest pain.  DG Chest Portable 1 View  Result Date: 02/29/2020 CLINICAL DATA:  Chest pain EXAM: PORTABLE CHEST 1 VIEW COMPARISON:  December 06, 2019 FINDINGS: There is mild left base atelectasis. Lungs elsewhere are clear. Heart is mildly enlarged with pulmonary vascularity normal. No adenopathy. There is degenerative change in the thoracic spine. There is calcification in the left carotid artery.  IMPRESSION: Mild left base atelectasis. Lungs elsewhere clear. Stable cardiac prominence. Aortic Atherosclerosis (ICD10-I70.0). There is also left carotid artery calcification. Electronically Signed   By: Lowella Grip III M.D.   On: 02/29/2020 10:47   ECHOCARDIOGRAM COMPLETE  Result Date: 02/29/2020    ECHOCARDIOGRAM REPORT   Patient Name:   MICHIKO MANDLER [facility] Date of Exam: 02/29/2020 Medical Rec #:  [id number]      Height:       60.0 in Accession #:    [id number]     Weight:       179.0 lb Date of Birth:  15-Dec-1940      BSA:          1.781 m Patient Age:    50 years       BP:           130/71 mmHg Patient Gender: F              HR:           61 bpm. Exam Location:  Inpatient Procedure: 2D Echo STAT ECHO Indications:    chest pain 786.50  History:        Patient has prior history of Echocardiogram examinations, most  recent 03/23/2016. Risk Factors:Hypertension and Diabetes.  Sonographer:    Jannett Celestine RDCS (AE) Referring Phys: [phone] HAO MENG IMPRESSIONS  1. Left ventricular ejection fraction, by estimation, is 45 to 50%. The left ventricle has mildly decreased function. The left ventricle demonstrates regional wall motion abnormalities (see scoring diagram/findings for description). Inferior hypokinesis. There is mild left ventricular hypertrophy. Left ventricular diastolic parameters are indeterminate.  2. Right ventricular systolic function is normal. The right ventricular size is normal.  3. The mitral valve is normal in structure. Trivial mitral valve regurgitation.  4. The aortic valve was not well visualized. Aortic valve regurgitation is not visualized. No aortic stenosis is present. FINDINGS  Left Ventricle: Left ventricular ejection fraction, by estimation, is 45 to 50%. The left ventricle has mildly decreased function. The left ventricle demonstrates regional wall motion abnormalities. The left ventricular internal cavity size was normal in size. There is mild left ventricular  hypertrophy. Left ventricular diastolic parameters are indeterminate.  LV Wall Scoring: The inferior wall is hypokinetic. The entire anterior wall, entire lateral wall, entire septum, and entire apex are normal. Right Ventricle: The right ventricular size is normal. Right vetricular wall thickness was not assessed. Right ventricular systolic function is normal. Left Atrium: Left atrial size was normal in size. Right Atrium: Right atrial size was normal in size. Pericardium: Trivial pericardial effusion is present. Mitral Valve: The mitral valve is normal in structure. Trivial mitral valve regurgitation. Tricuspid Valve: The tricuspid valve is normal in structure. Tricuspid valve regurgitation is trivial. Aortic Valve: The aortic valve was not well visualized. Aortic valve regurgitation is not visualized. No aortic stenosis is present. Pulmonic Valve: The pulmonic valve was not well visualized. Pulmonic valve regurgitation is not visualized. Aorta: The aortic root is normal in size and structure. IAS/Shunts: The interatrial septum was not well visualized.  LEFT VENTRICLE PLAX 2D LVIDd:         4.70 cm  Diastology LVIDs:         3.30 cm  LV e' lateral:   5.33 cm/s LV PW:         1.20 cm  LV E/e' lateral: 11.6 LV IVS:        1.10 cm  LV e' medial:    6.74 cm/s LVOT diam:     2.00 cm  LV E/e' medial:  9.2 LV SV:         48 LV SV Index:   27 LVOT Area:     3.14 cm  RIGHT VENTRICLE RV S prime:     11.50 cm/s TAPSE (M-mode): 2.0 cm LEFT ATRIUM             Index       RIGHT ATRIUM           Index LA diam:        3.70 cm 2.08 cm/m  RA Area:     17.40 cm LA Vol (A2C):   32.6 ml 18.31 ml/m RA Volume:   42.20 ml  23.70 ml/m LA Vol (A4C):   45.6 ml 25.61 ml/m LA Biplane Vol: 41.4 ml 23.25 ml/m  AORTIC VALVE LVOT Vmax:   62.70 cm/s LVOT Vmean:  46.300 cm/s LVOT VTI:    0.154 m  AORTA Ao Root diam: 3.00 cm MITRAL VALVE MV Area (PHT): 3.83 cm    SHUNTS MV Decel Time: 198 msec    Systemic VTI:  0.15 m MV E velocity: 61.70  cm/s  Systemic Diam: 2.00 cm MV A velocity: 85.70  cm/s MV E/A ratio:  0.72 Oswaldo Milian MD Electronically signed by Oswaldo Milian MD Signature Date/Time: 02/29/2020/1:51:24 PM    Final    VAS US DOPPLER PRE CABG  Result Date: 03/01/2020 PREOPERATIVE VASCULAR EVALUATION  Indications:      Pre-CABG. Limitations:      Patient body habitus, TR band Comparison Study: No prior exam. Performing Technologist: Baldwin Crown RVT, RDMS  Examination Guidelines: A complete evaluation includes B-mode imaging, spectral Doppler, color Doppler, and power Doppler as needed of all accessible portions of each vessel. Bilateral testing is considered an integral part of a complete examination. Limited examinations for reoccurring indications may be performed as noted.  Right Carotid Findings: +----------+--------+--------+--------+------------+--------+           PSV cm/sEDV cm/sStenosisDescribe    Comments +----------+--------+--------+--------+------------+--------+ CCA Prox  57      11                                   +----------+--------+--------+--------+------------+--------+ CCA Distal60      12                                   +----------+--------+--------+--------+------------+--------+ ICA Prox  48      14      1-39%   heterogenous         +----------+--------+--------+--------+------------+--------+ ICA Distal50      13                                   +----------+--------+--------+--------+------------+--------+ ECA       91      21              calcific             +----------+--------+--------+--------+------------+--------+ Portions of this table do not appear on this page. +----------+--------+-------+----------------+------------+           PSV cm/sEDV cmsDescribe        Arm Pressure +----------+--------+-------+----------------+------------+ Subclavian69             Multiphasic, [id number]           +----------+--------+-------+----------------+------------+ +---------+--------+--+--------+-+---------+ VertebralPSV cm/s31EDV cm/s9Antegrade +---------+--------+--+--------+-+---------+ Left Carotid Findings: +----------+--------+--------+--------+-------------------------+--------+           PSV cm/sEDV cm/sStenosisDescribe                 Comments +----------+--------+--------+--------+-------------------------+--------+ CCA Prox  71      11                                                +----------+--------+--------+--------+-------------------------+--------+ CCA Distal71      13              heterogenous                      +----------+--------+--------+--------+-------------------------+--------+ ICA Prox  68      21              calcific and heterogenous         +----------+--------+--------+--------+-------------------------+--------+ ICA Distal66      18                                                +----------+--------+--------+--------+-------------------------+--------+  ECA       70      17                                                +----------+--------+--------+--------+-------------------------+--------+ +----------+--------+--------+----------------+------------+ SubclavianPSV cm/sEDV cm/sDescribe        Arm Pressure +----------+--------+--------+----------------+------------+           82              Multiphasic, [id number]          +----------+--------+--------+----------------+------------+ +---------+--------+--+--------+--+---------+ VertebralPSV cm/s48EDV cm/s10Antegrade +---------+--------+--+--------+--+---------+  ABI Findings: +--------+------------------+-----+---------+--------+ Right   Rt Pressure (mmHg)IndexWaveform Comment  +--------+------------------+-----+---------+--------+ [id number]                    triphasic         +--------+------------------+-----+---------+--------+ PTA     120                1.02 triphasic         +--------+------------------+-----+---------+--------+ DP      136               1.15 triphasic         +--------+------------------+-----+---------+--------+ +--------+------------------+-----+---------+-------+ Left    Lt Pressure (mmHg)IndexWaveform Comment +--------+------------------+-----+---------+-------+ [id number]                    triphasic        +--------+------------------+-----+---------+-------+ PTA     147               1.25 triphasic        +--------+------------------+-----+---------+-------+ DP      120               1.02 triphasic        +--------+------------------+-----+---------+-------+ +-------+---------------+----------------+ ABI/TBIToday's ABI/TBIPrevious ABI/TBI +-------+---------------+----------------+ Right  1.15                            +-------+---------------+----------------+ Left   1.25                            +-------+---------------+----------------+  Right Doppler Findings: +--------+--------+-----+---------+--------+ Site    PressureIndexDoppler  Comments +--------+--------+-----+---------+--------+ [id number]          triphasic         +--------+--------+-----+---------+--------+ Radial               triphasic         +--------+--------+-----+---------+--------+ Ulnar                triphasic         +--------+--------+-----+---------+--------+  Left Doppler Findings: +--------+--------+-----+---------+--------+ Site    PressureIndexDoppler  Comments +--------+--------+-----+---------+--------+ [id number]          triphasic         +--------+--------+-----+---------+--------+ Radial               triphasic         +--------+--------+-----+---------+--------+ Ulnar                triphasic         +--------+--------+-----+---------+--------+  Summary: Right Carotid: Velocities in the right ICA are consistent with a 1-39% stenosis. Left Carotid:  Velocities in the left ICA are consistent with a 1-39% stenosis. Vertebrals:  Bilateral vertebral arteries demonstrate antegrade flow. Subclavians: Normal flow  hemodynamics were seen in bilateral subclavian              arteries. Right ABI: Resting right ankle-brachial index is within normal range. No evidence of significant right lower extremity arterial disease. Left ABI: Resting left ankle-brachial index is within normal range. No evidence of significant left lower extremity arterial disease. Right Upper Extremity: UTO due to TR band. Left Upper Extremity: Doppler waveforms remain within normal limits with left radial compression. Doppler waveforms decrease <50% with left ulnar compression.  Electronically signed by Deitra Mayo MD on 03/01/2020 at 4:59:06 PM.    Final     Cardiac Studies   TTE 02/29/2020 1. Left ventricular ejection fraction, by estimation, is 45 to 50%. The  left ventricle has mildly decreased function. The left ventricle  demonstrates regional wall motion abnormalities (see scoring  diagram/findings for description). Inferior  hypokinesis. There is mild left ventricular hypertrophy. Left ventricular  diastolic parameters are indeterminate.  2. Right ventricular systolic function is normal. The right ventricular  size is normal.  3. The mitral valve is normal in structure. Trivial mitral valve  regurgitation.  4. The aortic valve was not well visualized. Aortic valve regurgitation  is not visualized. No aortic stenosis is present.   LHC 03/01/2020  Severe multivessel coronary disease  Segmental proximal to mid 90% LAD stenosis.  Distal flow is TIMI grade II.  Right coronary supplies LAD via septal perforator collaterals.  90% mid to distal LAD before the second obtuse marginal.  100% circumflex after the second obtuse marginal.  Total occlusion of distal RCA.  Left ventricular branch and PDA fill by collaterals from the left coronary.  Distal left main  calcified with 30 to 40% narrowing  Basal inferior to mid inferior wall motion abnormality hypokinetic to akinetic.  EF 55%.  LVEDP 16 mmHg  RECOMMENDATIONS:   T CTS consultation for coronary bypass surgery which should be done soon.  Resume IV heparin  Continue IV nitroglycerin  Patient to notify of recurrent chest pain.  Patient Profile  Ms. Judy [name] is a 79 year old female with medical history of OSA on CPAP, recent COVID-19 infection, hypertension, diabetes, obesity who was admitted 02/29/2020 for NSTEMI. Course c/b by acute confusion post cath 03/01/2020. MRI shows acute sub-centimeter R cerebellar infarct.   Assessment & Plan   1.  Non-STEMI -severe 3v CAD; will need CABG -remain on heparin. NTG drip weaned.  -had confusion and small cerebellar infarct yesterday. Symptoms resolved today. Neuro will need to weigh in on safety of proceeding to CABG -Continue aspirin heparin drip.  She remains on high intensity statin. -No heart failure symptoms.  Telemetry stable. -continue BB as able   2. Confusion/Acute Subcentimeter R cerebellar Stroke  -became acutely confused post cath 3/30 at 12:30 PM. Head CT negative. CTA without significant stenoses. MRI shows subcentimeter acute R cerebellar infarct.  -minimal carotid disease on [name] -confusion resolved. Unclear if this caused her symptoms. Neuro to weigh in today.  -would like to get her to CABG as soon as medically able  3.  Hypertension -BP stable. BB.   4.  Hyperlipidemia -High intensity statin  5.  Diabetes -A1c 7.1 -Holding oral agents while in-house -Sliding-scale insulin  FEN -no IVF -diet: carb controlled  -dvt ppx: heparin  -code: full   For questions or updates, please contact [facility] Please consult [email or web address] for contact info under   Time Spent with Patient: I have spent a total of 35 minutes with patient reviewing hospital notes, telemetry,  EKGs, labs and examining the patient as well as  establishing an assessment and plan that was discussed with the patient.  > 50% of time was spent in direct patient care.    Signed, Addison Naegeli. Audie Box, [place]  03/02/2020 8:17 AM

## 2020-03-02 NOTE — Progress Notes (Signed)
Results from MRI show a 5 mm acute right cerebellar infarct.  Informed the on call for cardiology and neuro.  No new orders.  Will continue to monitor.  Lupita Dawn, RN

## 2020-03-02 NOTE — Anesthesia Preprocedure Evaluation (Addendum)
Anesthesia Evaluation  Patient identified by MRN, date of birth, ID band Patient awake    Reviewed: Allergy & Precautions, NPO status , Patient's Chart, lab work & pertinent test results  Airway Mallampati: III  TM Distance: >3 FB Neck ROM: Full    Dental  (+) Dental Advisory Given   Pulmonary shortness of breath, sleep apnea ,    breath sounds clear to auscultation       Cardiovascular hypertension, Pt. on medications + CAD and + Past MI   Rhythm:Regular Rate:Normal  EF 45-50%. No significant valvular abnormalities   Neuro/Psych Anxiety Depression CVA    GI/Hepatic negative GI ROS, Neg liver ROS,   Endo/Other  diabetes, Type 2  Renal/GU Renal InsufficiencyRenal disease     Musculoskeletal   Abdominal   Peds  Hematology negative hematology ROS (+)   Anesthesia Other Findings   Reproductive/Obstetrics                            Lab Results  Component Value Date   WBC 10.5 03/02/2020   HGB 11.4 (L) 03/02/2020   HCT 35.2 (L) 03/02/2020   MCV 92.4 03/02/2020   PLT 239 03/02/2020   Lab Results  Component Value Date   CREATININE 1.16 (H) 03/01/2020   BUN 20 03/01/2020   NA 138 03/01/2020   K 3.9 03/01/2020   CL 101 03/01/2020   CO2 28 03/01/2020    Anesthesia Physical Anesthesia Plan  ASA: IV  Anesthesia Plan: General   Post-op Pain Management:    Induction: Intravenous  PONV Risk Score and Plan: 3 and Treatment may vary due to age or medical condition, Midazolam and Ondansetron  Airway Management Planned: Oral ETT and Video Laryngoscope Planned  Additional Equipment: Arterial line, CVP, TEE and Ultrasound Guidance Line Placement  Intra-op Plan:   Post-operative Plan: Post-operative intubation/ventilation  Informed Consent: I have reviewed the patients History and Physical, chart, labs and discussed the procedure including the risks, benefits and alternatives for the  proposed anesthesia with the patient or authorized representative who has indicated his/her understanding and acceptance.     Dental advisory given  Plan Discussed with: CRNA  Anesthesia Plan Comments:        Anesthesia Quick Evaluation

## 2020-03-02 NOTE — Progress Notes (Addendum)
ANTICOAGULATION CONSULT NOTE - Follow Up Consult  Pharmacy Consult for heparin Indication: chest pain/ACS  No Known Allergies  Patient Measurements: Height: 5' (152.4 cm) Weight: 179 lb 10.8 oz (81.5 kg) IBW/kg (Calculated) : 45.5 Heparin Dosing Weight: 64.2 kg  Vital Signs: Temp: 98.5 F (36.9 C) (03/31 0531) Temp Source: Oral (03/31 0531) BP: 93/82 (03/31 1200) Pulse Rate: 69 (03/31 0531)  Labs: Recent Labs    02/29/20 1037 02/29/20 1309 02/29/20 1732 02/29/20 2137 03/01/20 0351 03/01/20 0650 03/01/20 0832 03/01/20 1010 03/02/20 0259 03/02/20 1258  HGB 14.0   < >  --   --  12.2  --   --   --  11.4*  --   HCT 43.9  --   --   --  37.6  --   --   --  35.2*  --   PLT 285  --   --   --  292  --   --   --  239  --   HEPARINUNFRC  --   --   --    < >  --  0.15*  --   --  0.22* 0.77*  CREATININE 1.18*  --   --   --  1.16*  --   --   --   --   --   TROPONINIHS 61*   < > 2,127*  --   --   --  [id number]* 12,678*  --   --    < > = values in this interval not displayed.    Estimated Creatinine Clearance: 38.4 mL/min (A) (by C-G formula based on SCr of 1.16 mg/dL (H)).   Medical History: Past Medical History:  Diagnosis Date  . Anxiety   . Back pain   . Complication of anesthesia 2003   gallbladder surgery-resp. arrest-stop operation and pt. went to ICU - patient stated she has back surgery since then and had no trouble at all  . Depression   . Diabetes ([place])   . Dyspnea   . Gout   . History of kidney stones   . Hypertension   . Leg edema   . Pancreatitis, chronic ([place]) 03/2016  . Pneumonia due to COVID-19 virus 12/03/2019  . Sleep apnea     Assessment: 79 yr old female presented to the ED with CP and elevated troponin; pharmacy was consulted to start IV heparin.  Pt was not on anticoagulation PTA.  She is noted with New CVA post cath. Plans for CABG.  -heparin level = 0.77 on 1400 units/hr   Goal of Therapy:  Heparin level 0.3-0.5 (due to CVA) Monitor  platelets by anticoagulation protocol: Yes   Plan:  Decrease heparin to 1300 units / hr Heparin level in 8 hours  Hildred Laser, PharmD Clinical Pharmacist **Pharmacist phone directory can now be found on [email or web address] ([email or web address]).  Listed under [place].

## 2020-03-02 NOTE — Progress Notes (Signed)
     [address]       [place] [address]             [phone]       MRI showed small cerebellar infarct. Mental status improved Will neuro clearance for systemic heparin dose for cardiopulmonary bypass.  Will need close to 40,000 units at time for surgery. Once cleared by neuro, will proceed with CABG.  Jennie Bolar Bary Leriche

## 2020-03-03 ENCOUNTER — Inpatient Hospital Stay (HOSPITAL_COMMUNITY): Payer: Medicare Other

## 2020-03-03 ENCOUNTER — Encounter (HOSPITAL_COMMUNITY)
Admission: EM | Disposition: A | Discharge status: Home / Self Care | Payer: Self-pay | Source: Home / Self Care | Attending: Thoracic Surgery (Cardiothoracic Vascular Surgery)

## 2020-03-03 ENCOUNTER — Inpatient Hospital Stay (HOSPITAL_COMMUNITY): Payer: Medicare Other | Admitting: Anesthesiology

## 2020-03-03 ENCOUNTER — Encounter (HOSPITAL_COMMUNITY): Payer: Self-pay | Admitting: Cardiovascular Disease

## 2020-03-03 DIAGNOSIS — Z951 Presence of aortocoronary bypass graft: Secondary | ICD-10-CM

## 2020-03-03 DIAGNOSIS — I25110 Atherosclerotic heart disease of native coronary artery with unstable angina pectoris: Secondary | ICD-10-CM

## 2020-03-03 DIAGNOSIS — I214 Non-ST elevation (NSTEMI) myocardial infarction: Secondary | ICD-10-CM

## 2020-03-03 HISTORY — PX: CORONARY ARTERY BYPASS GRAFT: SHX141

## 2020-03-03 HISTORY — PX: TEE WITHOUT CARDIOVERSION: SHX5443

## 2020-03-03 LAB — CBC
HCT: 37.7 % (ref 36.0–46.0)
HCT: 23.9 % — ABNORMAL LOW (ref 36.0–46.0)
HCT: 24.4 % — ABNORMAL LOW (ref 36.0–46.0)
Hemoglobin: 12.3 g/dL (ref 12.0–15.0)
Hemoglobin: 7.7 g/dL — ABNORMAL LOW (ref 12.0–15.0)
Hemoglobin: 7.9 g/dL — ABNORMAL LOW (ref 12.0–15.0)
MCH: 29.9 pg (ref 26.0–34.0)
MCH: 30.1 pg (ref 26.0–34.0)
MCH: 30.2 pg (ref 26.0–34.0)
MCHC: 32.6 g/dL (ref 30.0–36.0)
MCHC: 32.2 g/dL (ref 30.0–36.0)
MCHC: 32.4 g/dL (ref 30.0–36.0)
MCV: 91.5 fL (ref 80.0–100.0)
MCV: 93.4 fL (ref 80.0–100.0)
MCV: 93.1 fL (ref 80.0–100.0)
Platelets: 241 10*3/uL (ref 150–400)
Platelets: 106 10*3/uL — ABNORMAL LOW (ref 150–400)
Platelets: 168 10*3/uL (ref 150–400)
RBC: 4.12 MIL/uL (ref 3.87–5.11)
RBC: 2.56 MIL/uL — ABNORMAL LOW (ref 3.87–5.11)
RBC: 2.62 MIL/uL — ABNORMAL LOW (ref 3.87–5.11)
RDW: 13.3 % (ref 11.5–15.5)
RDW: 13.2 % (ref 11.5–15.5)
RDW: 13.2 % (ref 11.5–15.5)
WBC: 10 10*3/uL (ref 4.0–10.5)
WBC: 10 10*3/uL (ref 4.0–10.5)
WBC: 15 10*3/uL — ABNORMAL HIGH (ref 4.0–10.5)
nRBC: 0 % (ref 0.0–0.2)
nRBC: 0 % (ref 0.0–0.2)
nRBC: 0 % (ref 0.0–0.2)

## 2020-03-03 LAB — POCT I-STAT 7, (LYTES, BLD GAS, ICA,H+H)
Acid-Base Excess: 3 mmol/L — ABNORMAL HIGH (ref 0.0–2.0)
Acid-Base Excess: 2 mmol/L (ref 0.0–2.0)
Acid-Base Excess: 3 mmol/L — ABNORMAL HIGH (ref 0.0–2.0)
Acid-Base Excess: 1 mmol/L (ref 0.0–2.0)
Acid-base deficit: 4 mmol/L — ABNORMAL HIGH (ref 0.0–2.0)
Acid-base deficit: 3 mmol/L — ABNORMAL HIGH (ref 0.0–2.0)
Acid-base deficit: 6 mmol/L — ABNORMAL HIGH (ref 0.0–2.0)
Acid-base deficit: 6 mmol/L — ABNORMAL HIGH (ref 0.0–2.0)
Bicarbonate: 21.2 mmol/L (ref 20.0–28.0)
Bicarbonate: 28.3 mmol/L — ABNORMAL HIGH (ref 20.0–28.0)
Bicarbonate: 26.1 mmol/L (ref 20.0–28.0)
Bicarbonate: 27.2 mmol/L (ref 20.0–28.0)
Bicarbonate: 26 mmol/L (ref 20.0–28.0)
Bicarbonate: 22.4 mmol/L (ref 20.0–28.0)
Bicarbonate: 19.2 mmol/L — ABNORMAL LOW (ref 20.0–28.0)
Bicarbonate: 19.4 mmol/L — ABNORMAL LOW (ref 20.0–28.0)
Calcium, Ion: 1.24 mmol/L (ref 1.15–1.40)
Calcium, Ion: 0.95 mmol/L — ABNORMAL LOW (ref 1.15–1.40)
Calcium, Ion: 1.07 mmol/L — ABNORMAL LOW (ref 1.15–1.40)
Calcium, Ion: 1.07 mmol/L — ABNORMAL LOW (ref 1.15–1.40)
Calcium, Ion: 1.39 mmol/L (ref 1.15–1.40)
Calcium, Ion: 1.23 mmol/L (ref 1.15–1.40)
Calcium, Ion: 1.16 mmol/L (ref 1.15–1.40)
Calcium, Ion: 1.21 mmol/L (ref 1.15–1.40)
HCT: 32 % — ABNORMAL LOW (ref 36.0–46.0)
HCT: 24 % — ABNORMAL LOW (ref 36.0–46.0)
HCT: 21 % — ABNORMAL LOW (ref 36.0–46.0)
HCT: 22 % — ABNORMAL LOW (ref 36.0–46.0)
HCT: 21 % — ABNORMAL LOW (ref 36.0–46.0)
HCT: 23 % — ABNORMAL LOW (ref 36.0–46.0)
HCT: 20 % — ABNORMAL LOW (ref 36.0–46.0)
HCT: 20 % — ABNORMAL LOW (ref 36.0–46.0)
Hemoglobin: 10.9 g/dL — ABNORMAL LOW (ref 12.0–15.0)
Hemoglobin: 8.2 g/dL — ABNORMAL LOW (ref 12.0–15.0)
Hemoglobin: 7.1 g/dL — ABNORMAL LOW (ref 12.0–15.0)
Hemoglobin: 7.5 g/dL — ABNORMAL LOW (ref 12.0–15.0)
Hemoglobin: 7.1 g/dL — ABNORMAL LOW (ref 12.0–15.0)
Hemoglobin: 7.8 g/dL — ABNORMAL LOW (ref 12.0–15.0)
Hemoglobin: 6.8 g/dL — CL (ref 12.0–15.0)
Hemoglobin: 6.8 g/dL — CL (ref 12.0–15.0)
O2 Saturation: 99 %
O2 Saturation: 100 %
O2 Saturation: 100 %
O2 Saturation: 100 %
O2 Saturation: 100 %
O2 Saturation: 89 %
O2 Saturation: 96 %
O2 Saturation: 93 %
Patient temperature: 97.7
Patient temperature: 36.6
Patient temperature: 36.8
Potassium: 3.7 mmol/L (ref 3.5–5.1)
Potassium: 3.7 mmol/L (ref 3.5–5.1)
Potassium: 4.1 mmol/L (ref 3.5–5.1)
Potassium: 4.7 mmol/L (ref 3.5–5.1)
Potassium: 4 mmol/L (ref 3.5–5.1)
Potassium: 3.7 mmol/L (ref 3.5–5.1)
Potassium: 4.5 mmol/L (ref 3.5–5.1)
Potassium: 4.8 mmol/L (ref 3.5–5.1)
Sodium: 137 mmol/L (ref 135–145)
Sodium: 139 mmol/L (ref 135–145)
Sodium: 137 mmol/L (ref 135–145)
Sodium: 138 mmol/L (ref 135–145)
Sodium: 137 mmol/L (ref 135–145)
Sodium: 140 mmol/L (ref 135–145)
Sodium: 140 mmol/L (ref 135–145)
Sodium: 138 mmol/L (ref 135–145)
TCO2: 22 mmol/L (ref 22–32)
TCO2: 30 mmol/L (ref 22–32)
TCO2: 27 mmol/L (ref 22–32)
TCO2: 28 mmol/L (ref 22–32)
TCO2: 27 mmol/L (ref 22–32)
TCO2: 24 mmol/L (ref 22–32)
TCO2: 20 mmol/L — ABNORMAL LOW (ref 22–32)
TCO2: 21 mmol/L — ABNORMAL LOW (ref 22–32)
pCO2 arterial: 37 mmHg (ref 32.0–48.0)
pCO2 arterial: 44.8 mmHg (ref 32.0–48.0)
pCO2 arterial: 38 mmHg (ref 32.0–48.0)
pCO2 arterial: 39.9 mmHg (ref 32.0–48.0)
pCO2 arterial: 41.5 mmHg (ref 32.0–48.0)
pCO2 arterial: 40.9 mmHg (ref 32.0–48.0)
pCO2 arterial: 37 mmHg (ref 32.0–48.0)
pCO2 arterial: 38.9 mmHg (ref 32.0–48.0)
pH, Arterial: 7.365 (ref 7.350–7.450)
pH, Arterial: 7.407 (ref 7.350–7.450)
pH, Arterial: 7.425 (ref 7.350–7.450)
pH, Arterial: 7.462 — ABNORMAL HIGH (ref 7.350–7.450)
pH, Arterial: 7.406 (ref 7.350–7.450)
pH, Arterial: 7.344 — ABNORMAL LOW (ref 7.350–7.450)
pH, Arterial: 7.32 — ABNORMAL LOW (ref 7.350–7.450)
pH, Arterial: 7.305 — ABNORMAL LOW (ref 7.350–7.450)
pO2, Arterial: 135 mmHg — ABNORMAL HIGH (ref 83.0–108.0)
pO2, Arterial: 422 mmHg — ABNORMAL HIGH (ref 83.0–108.0)
pO2, Arterial: 296 mmHg — ABNORMAL HIGH (ref 83.0–108.0)
pO2, Arterial: 364 mmHg — ABNORMAL HIGH (ref 83.0–108.0)
pO2, Arterial: 361 mmHg — ABNORMAL HIGH (ref 83.0–108.0)
pO2, Arterial: 58 mmHg — ABNORMAL LOW (ref 83.0–108.0)
pO2, Arterial: 86 mmHg (ref 83.0–108.0)
pO2, Arterial: 72 mmHg — ABNORMAL LOW (ref 83.0–108.0)

## 2020-03-03 LAB — POCT I-STAT, CHEM 8
BUN: 20 mg/dL (ref 8–23)
BUN: 19 mg/dL (ref 8–23)
BUN: 17 mg/dL (ref 8–23)
BUN: 19 mg/dL (ref 8–23)
BUN: 22 mg/dL (ref 8–23)
BUN: 21 mg/dL (ref 8–23)
Calcium, Ion: 1.27 mmol/L (ref 1.15–1.40)
Calcium, Ion: 1.21 mmol/L (ref 1.15–1.40)
Calcium, Ion: 0.94 mmol/L — ABNORMAL LOW (ref 1.15–1.40)
Calcium, Ion: 1.08 mmol/L — ABNORMAL LOW (ref 1.15–1.40)
Calcium, Ion: 1.09 mmol/L — ABNORMAL LOW (ref 1.15–1.40)
Calcium, Ion: 1.37 mmol/L (ref 1.15–1.40)
Chloride: 101 mmol/L (ref 98–111)
Chloride: 103 mmol/L (ref 98–111)
Chloride: 97 mmol/L — ABNORMAL LOW (ref 98–111)
Chloride: 101 mmol/L (ref 98–111)
Chloride: 102 mmol/L (ref 98–111)
Chloride: 103 mmol/L (ref 98–111)
Creatinine, Ser: 1.1 mg/dL — ABNORMAL HIGH (ref 0.44–1.00)
Creatinine, Ser: 1 mg/dL (ref 0.44–1.00)
Creatinine, Ser: 0.8 mg/dL (ref 0.44–1.00)
Creatinine, Ser: 0.8 mg/dL (ref 0.44–1.00)
Creatinine, Ser: 0.8 mg/dL (ref 0.44–1.00)
Creatinine, Ser: 0.9 mg/dL (ref 0.44–1.00)
Glucose, Bld: 99 mg/dL (ref 70–99)
Glucose, Bld: 110 mg/dL — ABNORMAL HIGH (ref 70–99)
Glucose, Bld: 85 mg/dL (ref 70–99)
Glucose, Bld: 102 mg/dL — ABNORMAL HIGH (ref 70–99)
Glucose, Bld: 131 mg/dL — ABNORMAL HIGH (ref 70–99)
Glucose, Bld: 125 mg/dL — ABNORMAL HIGH (ref 70–99)
HCT: 33 % — ABNORMAL LOW (ref 36.0–46.0)
HCT: 30 % — ABNORMAL LOW (ref 36.0–46.0)
HCT: 27 % — ABNORMAL LOW (ref 36.0–46.0)
HCT: 25 % — ABNORMAL LOW (ref 36.0–46.0)
HCT: 27 % — ABNORMAL LOW (ref 36.0–46.0)
HCT: 24 % — ABNORMAL LOW (ref 36.0–46.0)
Hemoglobin: 11.2 g/dL — ABNORMAL LOW (ref 12.0–15.0)
Hemoglobin: 10.2 g/dL — ABNORMAL LOW (ref 12.0–15.0)
Hemoglobin: 9.2 g/dL — ABNORMAL LOW (ref 12.0–15.0)
Hemoglobin: 8.5 g/dL — ABNORMAL LOW (ref 12.0–15.0)
Hemoglobin: 9.2 g/dL — ABNORMAL LOW (ref 12.0–15.0)
Hemoglobin: 8.2 g/dL — ABNORMAL LOW (ref 12.0–15.0)
Potassium: 3.8 mmol/L (ref 3.5–5.1)
Potassium: 3.4 mmol/L — ABNORMAL LOW (ref 3.5–5.1)
Potassium: 3.7 mmol/L (ref 3.5–5.1)
Potassium: 4.1 mmol/L (ref 3.5–5.1)
Potassium: 4.7 mmol/L (ref 3.5–5.1)
Potassium: 3.9 mmol/L (ref 3.5–5.1)
Sodium: 136 mmol/L (ref 135–145)
Sodium: 136 mmol/L (ref 135–145)
Sodium: 138 mmol/L (ref 135–145)
Sodium: 135 mmol/L (ref 135–145)
Sodium: 137 mmol/L (ref 135–145)
Sodium: 136 mmol/L (ref 135–145)
TCO2: 25 mmol/L (ref 22–32)
TCO2: 27 mmol/L (ref 22–32)
TCO2: 29 mmol/L (ref 22–32)
TCO2: 27 mmol/L (ref 22–32)
TCO2: 30 mmol/L (ref 22–32)
TCO2: 30 mmol/L (ref 22–32)

## 2020-03-03 LAB — PROTIME-INR
INR: 1.7 — ABNORMAL HIGH (ref 0.8–1.2)
Prothrombin Time: 19.7 seconds — ABNORMAL HIGH (ref 11.4–15.2)

## 2020-03-03 LAB — APTT: aPTT: 30 seconds (ref 24–36)

## 2020-03-03 LAB — BASIC METABOLIC PANEL
Anion gap: 11 (ref 5–15)
Anion gap: 9 (ref 5–15)
BUN: 20 mg/dL (ref 8–23)
BUN: 20 mg/dL (ref 8–23)
CO2: 25 mmol/L (ref 22–32)
CO2: 19 mmol/L — ABNORMAL LOW (ref 22–32)
Calcium: 9 mg/dL (ref 8.9–10.3)
Calcium: 8 mg/dL — ABNORMAL LOW (ref 8.9–10.3)
Chloride: 101 mmol/L (ref 98–111)
Chloride: 109 mmol/L (ref 98–111)
Creatinine, Ser: 1.25 mg/dL — ABNORMAL HIGH (ref 0.44–1.00)
Creatinine, Ser: 1.12 mg/dL — ABNORMAL HIGH (ref 0.44–1.00)
GFR calc Af Amer: 48 mL/min — ABNORMAL LOW (ref >60)
GFR calc Af Amer: 55 mL/min — ABNORMAL LOW (ref >60)
GFR calc non Af Amer: 41 mL/min — ABNORMAL LOW (ref >60)
GFR calc non Af Amer: 47 mL/min — ABNORMAL LOW (ref >60)
Glucose, Bld: 103 mg/dL — ABNORMAL HIGH (ref 70–99)
Glucose, Bld: 133 mg/dL — ABNORMAL HIGH (ref 70–99)
Potassium: 3.6 mmol/L (ref 3.5–5.1)
Potassium: 5.2 mmol/L — ABNORMAL HIGH (ref 3.5–5.1)
Sodium: 137 mmol/L (ref 135–145)
Sodium: 137 mmol/L (ref 135–145)

## 2020-03-03 LAB — HEMOGLOBIN AND HEMATOCRIT, BLOOD
HCT: 22.6 % — ABNORMAL LOW (ref 36.0–46.0)
Hemoglobin: 7.5 g/dL — ABNORMAL LOW (ref 12.0–15.0)

## 2020-03-03 LAB — ECHO INTRAOPERATIVE TEE
Height: 60 in
Weight: 2926.4 oz

## 2020-03-03 LAB — GLUCOSE, CAPILLARY
Glucose-Capillary: 103 mg/dL — ABNORMAL HIGH (ref 70–99)
Glucose-Capillary: 103 mg/dL — ABNORMAL HIGH (ref 70–99)
Glucose-Capillary: 146 mg/dL — ABNORMAL HIGH (ref 70–99)
Glucose-Capillary: 128 mg/dL — ABNORMAL HIGH (ref 70–99)
Glucose-Capillary: 120 mg/dL — ABNORMAL HIGH (ref 70–99)
Glucose-Capillary: 108 mg/dL — ABNORMAL HIGH (ref 70–99)
Glucose-Capillary: 111 mg/dL — ABNORMAL HIGH (ref 70–99)
Glucose-Capillary: 107 mg/dL — ABNORMAL HIGH (ref 70–99)
Glucose-Capillary: 118 mg/dL — ABNORMAL HIGH (ref 70–99)
Glucose-Capillary: 142 mg/dL — ABNORMAL HIGH (ref 70–99)

## 2020-03-03 LAB — SURGICAL PCR SCREEN
MRSA, PCR: NEGATIVE
Staphylococcus aureus: POSITIVE — AB

## 2020-03-03 LAB — HEPARIN LEVEL (UNFRACTIONATED): Heparin Unfractionated: 0.41 IU/mL (ref 0.30–0.70)

## 2020-03-03 LAB — PLATELET COUNT: Platelets: 158 K/uL (ref 150–400)

## 2020-03-03 LAB — MAGNESIUM: Magnesium: 2.8 mg/dL — ABNORMAL HIGH (ref 1.7–2.4)

## 2020-03-03 LAB — PREPARE RBC (CROSSMATCH)

## 2020-03-03 SURGERY — CORONARY ARTERY BYPASS GRAFTING (CABG)
Anesthesia: General | Site: Chest

## 2020-03-03 MED ORDER — LACTATED RINGERS IV SOLN: INTRAVENOUS | Status: DC | PRN

## 2020-03-03 MED ORDER — VANCOMYCIN HCL IN DEXTROSE 1-5 GM/200ML-% IV SOLN
1000.0000 mg | Freq: Once | INTRAVENOUS | Status: AC
  Administered 2020-03-03: 1000 mg via INTRAVENOUS
  Filled 2020-03-03: qty 200

## 2020-03-03 MED ORDER — ONDANSETRON HCL 4 MG/2ML IJ SOLN
INTRAMUSCULAR | Status: DC | PRN
  Administered 2020-03-03: 4 mg via INTRAVENOUS

## 2020-03-03 MED ORDER — PROTAMINE SULFATE 10 MG/ML IV SOLN
INTRAVENOUS | Status: DC | PRN
  Administered 2020-03-03: 150 mg via INTRAVENOUS
  Administered 2020-03-03: 30 mg via INTRAVENOUS
  Administered 2020-03-03: 120 mg via INTRAVENOUS

## 2020-03-03 MED ORDER — SODIUM CHLORIDE 0.9 % IV SOLN: INTRAVENOUS | Status: DC

## 2020-03-03 MED ORDER — EPHEDRINE SULFATE-NACL 50-0.9 MG/10ML-% IV SOSY
PREFILLED_SYRINGE | INTRAVENOUS | Status: DC | PRN
  Administered 2020-03-03: 5 mg via INTRAVENOUS

## 2020-03-03 MED ORDER — NITROGLYCERIN IN D5W 200-5 MCG/ML-% IV SOLN: 0.0000 ug/min | INTRAVENOUS | Status: DC

## 2020-03-03 MED ORDER — PHENYLEPHRINE 40 MCG/ML (10ML) SYRINGE FOR IV PUSH (FOR BLOOD PRESSURE SUPPORT)
PREFILLED_SYRINGE | INTRAVENOUS | Status: DC | PRN
  Administered 2020-03-03: 40 ug via INTRAVENOUS
  Administered 2020-03-03: 160 ug via INTRAVENOUS
  Administered 2020-03-03: 40 ug via INTRAVENOUS

## 2020-03-03 MED ORDER — ORAL CARE MOUTH RINSE
15.0000 mL | OROMUCOSAL | Status: DC
  Administered 2020-03-03 (×2): 15 mL via OROMUCOSAL

## 2020-03-03 MED ORDER — SODIUM CHLORIDE 0.9 % IV SOLN
1.5000 g | Freq: Two times a day (BID) | INTRAVENOUS | Status: DC
  Administered 2020-03-03 – 2020-03-04 (×3): 1.5 g via INTRAVENOUS
  Filled 2020-03-03 (×4): qty 1.5

## 2020-03-03 MED ORDER — MORPHINE SULFATE (PF) 2 MG/ML IV SOLN
1.0000 mg | INTRAVENOUS | Status: DC | PRN
  Administered 2020-03-03: 1 mg via INTRAVENOUS
  Administered 2020-03-04: 2 mg via INTRAVENOUS
  Administered 2020-03-04: 1 mg via INTRAVENOUS
  Filled 2020-03-03 (×3): qty 1

## 2020-03-03 MED ORDER — 0.9 % SODIUM CHLORIDE (POUR BTL) OPTIME
TOPICAL | Status: DC | PRN
  Administered 2020-03-03: 5000 mL

## 2020-03-03 MED ORDER — LIDOCAINE 2% (20 MG/ML) 5 ML SYRINGE
INTRAMUSCULAR | Status: DC | PRN
  Administered 2020-03-03: 100 mg via INTRAVENOUS

## 2020-03-03 MED ORDER — METOPROLOL TARTRATE 25 MG/10 ML ORAL SUSPENSION
12.5000 mg | Freq: Two times a day (BID) | ORAL | Status: DC
  Filled 2020-03-03 (×6): qty 5

## 2020-03-03 MED ORDER — ROCURONIUM BROMIDE 100 MG/10ML IV SOLN
INTRAVENOUS | Status: DC | PRN
  Administered 2020-03-03: 30 mg via INTRAVENOUS
  Administered 2020-03-03: 100 mg via INTRAVENOUS
  Administered 2020-03-03: 20 mg via INTRAVENOUS

## 2020-03-03 MED ORDER — MAGNESIUM SULFATE 4 GM/100ML IV SOLN
4.0000 g | Freq: Once | INTRAVENOUS | Status: AC
  Administered 2020-03-03: 4 g via INTRAVENOUS
  Filled 2020-03-03: qty 100

## 2020-03-03 MED ORDER — ALBUMIN HUMAN 5 % IV SOLN
250.0000 mL | INTRAVENOUS | Status: AC | PRN
  Administered 2020-03-03 (×2): 12.5 g via INTRAVENOUS

## 2020-03-03 MED ORDER — NITROGLYCERIN 0.2 MG/ML ON CALL CATH LAB
INTRAVENOUS | Status: DC | PRN
  Administered 2020-03-03: 10 ug via INTRAVENOUS
  Administered 2020-03-03: 20 ug via INTRAVENOUS
  Administered 2020-03-03 (×2): 10 ug via INTRAVENOUS

## 2020-03-03 MED ORDER — SODIUM CHLORIDE 0.9% FLUSH: 3.0000 mL | INTRAVENOUS | Status: DC | PRN

## 2020-03-03 MED ORDER — DOBUTAMINE IN D5W 4-5 MG/ML-% IV SOLN: 2.5000 ug/kg/min | INTRAVENOUS | Status: DC

## 2020-03-03 MED ORDER — DEXMEDETOMIDINE HCL IN NACL 400 MCG/100ML IV SOLN: 0.0000 ug/kg/h | INTRAVENOUS | Status: DC

## 2020-03-03 MED ORDER — SODIUM CHLORIDE (PF) 0.9 % IJ SOLN
OROMUCOSAL | Status: DC | PRN
  Administered 2020-03-03 (×3): 4 mL via TOPICAL

## 2020-03-03 MED ORDER — TRAMADOL HCL 50 MG PO TABS
50.0000 mg | ORAL_TABLET | ORAL | Status: DC | PRN
  Administered 2020-03-03 – 2020-03-04 (×4): 100 mg via ORAL
  Filled 2020-03-03 (×4): qty 2

## 2020-03-03 MED ORDER — DOCUSATE SODIUM 100 MG PO CAPS
200.0000 mg | ORAL_CAPSULE | Freq: Every day | ORAL | Status: DC
  Administered 2020-03-04 – 2020-03-05 (×2): 200 mg via ORAL
  Filled 2020-03-03 (×3): qty 2

## 2020-03-03 MED ORDER — PHENYLEPHRINE HCL-NACL 20-0.9 MG/250ML-% IV SOLN: 0.0000 ug/min | INTRAVENOUS | Status: DC

## 2020-03-03 MED ORDER — DOBUTAMINE IN D5W 4-5 MG/ML-% IV SOLN
INTRAVENOUS | Status: DC | PRN
  Administered 2020-03-03: 2.5 ug/kg/min via INTRAVENOUS

## 2020-03-03 MED ORDER — SODIUM CHLORIDE 0.45 % IV SOLN: INTRAVENOUS | Status: DC | PRN

## 2020-03-03 MED ORDER — SODIUM CHLORIDE 0.9 % IV SOLN
INTRAVENOUS | Status: DC | PRN
  Administered 2020-03-03: 750 mg via INTRAVENOUS

## 2020-03-03 MED ORDER — ACETAMINOPHEN 500 MG PO TABS
1000.0000 mg | ORAL_TABLET | Freq: Four times a day (QID) | ORAL | Status: DC
  Administered 2020-03-03 – 2020-03-06 (×11): 1000 mg via ORAL
  Filled 2020-03-03 (×17): qty 2

## 2020-03-03 MED ORDER — ASPIRIN 81 MG PO CHEW
324.0000 mg | CHEWABLE_TABLET | Freq: Every day | ORAL | Status: DC
  Filled 2020-03-03: qty 4

## 2020-03-03 MED ORDER — SODIUM CHLORIDE 0.9 % IV SOLN: INTRAVENOUS | Status: AC

## 2020-03-03 MED ORDER — ACETAMINOPHEN 650 MG RE SUPP
650.0000 mg | Freq: Once | RECTAL | Status: AC
  Administered 2020-03-03: 650 mg via RECTAL

## 2020-03-03 MED ORDER — BISACODYL 10 MG RE SUPP: 10.0000 mg | Freq: Every day | RECTAL | Status: DC

## 2020-03-03 MED ORDER — INSULIN REGULAR(HUMAN) IN NACL 100-0.9 UT/100ML-% IV SOLN: INTRAVENOUS | Status: DC

## 2020-03-03 MED ORDER — CHLORHEXIDINE GLUCONATE 0.12% ORAL RINSE (MEDLINE KIT)
15.0000 mL | Freq: Two times a day (BID) | OROMUCOSAL | Status: DC
  Administered 2020-03-03: 15 mL via OROMUCOSAL

## 2020-03-03 MED ORDER — ORAL CARE MOUTH RINSE
15.0000 mL | Freq: Two times a day (BID) | OROMUCOSAL | Status: DC
  Administered 2020-03-03 – 2020-03-08 (×7): 15 mL via OROMUCOSAL

## 2020-03-03 MED ORDER — LACTATED RINGERS IV SOLN: INTRAVENOUS | Status: DC

## 2020-03-03 MED ORDER — MUPIROCIN 2 % EX OINT
1.0000 "application " | TOPICAL_OINTMENT | Freq: Two times a day (BID) | CUTANEOUS | Status: AC
  Administered 2020-03-03 – 2020-03-07 (×9): 1 via NASAL
  Filled 2020-03-03 (×3): qty 22

## 2020-03-03 MED ORDER — PROPOFOL 10 MG/ML IV BOLUS
INTRAVENOUS | Status: DC | PRN
  Administered 2020-03-03: 20 mg via INTRAVENOUS
  Administered 2020-03-03: 30 mg via INTRAVENOUS

## 2020-03-03 MED ORDER — ONDANSETRON HCL 4 MG/2ML IJ SOLN
4.0000 mg | Freq: Four times a day (QID) | INTRAMUSCULAR | Status: DC | PRN
  Administered 2020-03-04 – 2020-03-05 (×2): 4 mg via INTRAVENOUS
  Filled 2020-03-03 (×2): qty 2

## 2020-03-03 MED ORDER — SODIUM CHLORIDE 0.9% FLUSH
3.0000 mL | Freq: Two times a day (BID) | INTRAVENOUS | Status: DC
  Administered 2020-03-04 (×2): 3 mL via INTRAVENOUS

## 2020-03-03 MED ORDER — DEXTROSE 50 % IV SOLN: 0.0000 mL | INTRAVENOUS | Status: DC | PRN

## 2020-03-03 MED ORDER — OXYCODONE HCL 5 MG PO TABS
5.0000 mg | ORAL_TABLET | ORAL | Status: DC | PRN
  Administered 2020-03-04: 5 mg via ORAL
  Administered 2020-03-04: 10 mg via ORAL
  Administered 2020-03-08: 5 mg via ORAL
  Filled 2020-03-03 (×2): qty 1
  Filled 2020-03-03: qty 2

## 2020-03-03 MED ORDER — BISACODYL 5 MG PO TBEC
10.0000 mg | DELAYED_RELEASE_TABLET | Freq: Every day | ORAL | Status: DC
  Administered 2020-03-04 – 2020-03-06 (×3): 10 mg via ORAL
  Filled 2020-03-03 (×2): qty 2

## 2020-03-03 MED ORDER — ROCURONIUM BROMIDE 10 MG/ML (PF) SYRINGE
PREFILLED_SYRINGE | INTRAVENOUS | Status: DC | PRN
  Administered 2020-03-03: 100 mg via INTRAVENOUS

## 2020-03-03 MED ORDER — ALBUMIN HUMAN 5 % IV SOLN: INTRAVENOUS | Status: DC | PRN

## 2020-03-03 MED ORDER — PANTOPRAZOLE SODIUM 40 MG PO TBEC
40.0000 mg | DELAYED_RELEASE_TABLET | Freq: Every day | ORAL | Status: DC
  Administered 2020-03-05 – 2020-03-08 (×4): 40 mg via ORAL
  Filled 2020-03-03 (×4): qty 1

## 2020-03-03 MED ORDER — POTASSIUM CHLORIDE 10 MEQ/50ML IV SOLN
10.0000 meq | INTRAVENOUS | Status: AC
  Administered 2020-03-03 (×3): 10 meq via INTRAVENOUS

## 2020-03-03 MED ORDER — DEXMEDETOMIDINE HCL IN NACL 200 MCG/50ML IV SOLN
INTRAVENOUS | Status: AC
  Filled 2020-03-03: qty 50

## 2020-03-03 MED ORDER — ACETAMINOPHEN 160 MG/5ML PO SOLN: 1000.0000 mg | Freq: Four times a day (QID) | ORAL | Status: DC

## 2020-03-03 MED ORDER — LACTATED RINGERS IV SOLN: 500.0000 mL | Freq: Once | INTRAVENOUS | Status: DC | PRN

## 2020-03-03 MED ORDER — HEPARIN SODIUM (PORCINE) 1000 UNIT/ML IJ SOLN
INTRAMUSCULAR | Status: DC | PRN
  Administered 2020-03-03: 30000 [IU] via INTRAVENOUS

## 2020-03-03 MED ORDER — METOPROLOL TARTRATE 5 MG/5ML IV SOLN
2.5000 mg | INTRAVENOUS | Status: DC | PRN
  Administered 2020-03-03: 2.5 mg via INTRAVENOUS
  Filled 2020-03-03: qty 5

## 2020-03-03 MED ORDER — CHLORHEXIDINE GLUCONATE CLOTH 2 % EX PADS
6.0000 | MEDICATED_PAD | Freq: Every day | CUTANEOUS | Status: DC
  Administered 2020-03-03 – 2020-03-06 (×4): 6 via TOPICAL

## 2020-03-03 MED ORDER — FENTANYL CITRATE (PF) 250 MCG/5ML IJ SOLN
INTRAMUSCULAR | Status: AC
  Filled 2020-03-03: qty 25

## 2020-03-03 MED ORDER — FAMOTIDINE IN NACL 20-0.9 MG/50ML-% IV SOLN
20.0000 mg | Freq: Two times a day (BID) | INTRAVENOUS | Status: AC
  Administered 2020-03-03: 20 mg via INTRAVENOUS
  Filled 2020-03-03: qty 50

## 2020-03-03 MED ORDER — MIDAZOLAM HCL (PF) 10 MG/2ML IJ SOLN
INTRAMUSCULAR | Status: AC
  Filled 2020-03-03: qty 2

## 2020-03-03 MED ORDER — MIDAZOLAM HCL 2 MG/2ML IJ SOLN: 2.0000 mg | INTRAMUSCULAR | Status: DC | PRN

## 2020-03-03 MED ORDER — SUCCINYLCHOLINE CHLORIDE 20 MG/ML IJ SOLN
INTRAMUSCULAR | Status: DC | PRN
  Administered 2020-03-03: 100 mg via INTRAVENOUS

## 2020-03-03 MED ORDER — METOPROLOL TARTRATE 12.5 MG HALF TABLET
12.5000 mg | ORAL_TABLET | Freq: Two times a day (BID) | ORAL | Status: DC
  Administered 2020-03-04 – 2020-03-08 (×8): 12.5 mg via ORAL
  Filled 2020-03-03 (×8): qty 1

## 2020-03-03 MED ORDER — ASPIRIN EC 325 MG PO TBEC
325.0000 mg | DELAYED_RELEASE_TABLET | Freq: Every day | ORAL | Status: DC
  Administered 2020-03-04 – 2020-03-08 (×5): 325 mg via ORAL
  Filled 2020-03-03 (×5): qty 1

## 2020-03-03 MED ORDER — CHLORHEXIDINE GLUCONATE 0.12 % MT SOLN
15.0000 mL | OROMUCOSAL | Status: AC
  Administered 2020-03-03: 15:00:00 15 mL via OROMUCOSAL

## 2020-03-03 MED ORDER — ACETAMINOPHEN 160 MG/5ML PO SOLN: 650.0000 mg | Freq: Once | ORAL | Status: AC

## 2020-03-03 MED ORDER — FENTANYL CITRATE (PF) 250 MCG/5ML IJ SOLN
INTRAMUSCULAR | Status: DC | PRN
  Administered 2020-03-03: 450 ug via INTRAVENOUS
  Administered 2020-03-03 (×3): 150 ug via INTRAVENOUS
  Administered 2020-03-03: 50 ug via INTRAVENOUS
  Administered 2020-03-03: 100 ug via INTRAVENOUS

## 2020-03-03 MED ORDER — SODIUM CHLORIDE 0.9 % IV SOLN
250.0000 mL | INTRAVENOUS | Status: DC
  Administered 2020-03-04: 15:00:00 250 mL via INTRAVENOUS

## 2020-03-03 MED ORDER — NICARDIPINE HCL IN NACL 20-0.86 MG/200ML-% IV SOLN
3.0000 mg/h | INTRAVENOUS | Status: DC
  Filled 2020-03-03: qty 200

## 2020-03-03 MED ORDER — PROPOFOL 10 MG/ML IV BOLUS
INTRAVENOUS | Status: AC
  Filled 2020-03-03: qty 20

## 2020-03-03 MED ORDER — PROTAMINE SULFATE 10 MG/ML IV SOLN
INTRAVENOUS | Status: AC
  Filled 2020-03-03: qty 25

## 2020-03-03 MED ORDER — MIDAZOLAM HCL 5 MG/5ML IJ SOLN
INTRAMUSCULAR | Status: DC | PRN
  Administered 2020-03-03: 2 mg via INTRAVENOUS
  Administered 2020-03-03: 1 mg via INTRAVENOUS
  Administered 2020-03-03: 2 mg via INTRAVENOUS

## 2020-03-03 SURGICAL SUPPLY — 86 items
ADAPTER MULTI PERFUSION 15 (ADAPTER) ×3 IMPLANT
BAG DECANTER FOR FLEXI CONT (MISCELLANEOUS) ×3 IMPLANT
BASKET HEART (ORDER IN 25'S) (MISCELLANEOUS) ×3
BASKET HEART (ORDER IN 25S) (MISCELLANEOUS) ×2 IMPLANT
BLADE CLIPPER SURG (BLADE) ×3 IMPLANT
BLADE STERNUM SYSTEM 6 (BLADE) ×3 IMPLANT
BNDG ELASTIC 4X5.8 VLCR STR LF (GAUZE/BANDAGES/DRESSINGS) ×4 IMPLANT
BNDG ELASTIC 6X5.8 VLCR STR LF (GAUZE/BANDAGES/DRESSINGS) ×4 IMPLANT
BNDG GAUZE ELAST 4 BULKY (GAUZE/BANDAGES/DRESSINGS) ×4 IMPLANT
CABLE SURGICAL S-101-97-12 (CABLE) ×3 IMPLANT
CANISTER SUCT 3000ML PPV (MISCELLANEOUS) ×3 IMPLANT
CANNULA AORTIC ROOT 9FR (CANNULA) ×3 IMPLANT
CANNULA EZ GLIDE 8.0 24FR (CANNULA) ×3 IMPLANT
CANNULA MC2 2 STG 29/37 NON-V (CANNULA) ×2 IMPLANT
CANNULA MC2 TWO STAGE (CANNULA) ×3
CANNULA VESSEL 3MM BLUNT TIP (CANNULA) ×4 IMPLANT
CATH ROBINSON RED A/P 18FR (CATHETERS) ×7 IMPLANT
CLIP RETRACTION 3.0MM CORONARY (MISCELLANEOUS) ×3 IMPLANT
CLIP VESOCCLUDE MED 24/CT (CLIP) ×1 IMPLANT
CONN ST 1/2X1/2  BEN (MISCELLANEOUS) ×3
CONN ST 1/2X1/2 BEN (MISCELLANEOUS) ×2 IMPLANT
CONNECTOR BLAKE 2:1 CARIO BLK (MISCELLANEOUS) ×3 IMPLANT
DRAIN CHANNEL 19F RND (DRAIN) ×9 IMPLANT
DRAIN CONNECTOR BLAKE 1:1 (MISCELLANEOUS) ×3 IMPLANT
DRAPE CARDIOVASCULAR INCISE (DRAPES) ×3
DRAPE SLUSH/WARMER DISC (DRAPES) ×3 IMPLANT
DRAPE SRG 135X102X78XABS (DRAPES) ×2 IMPLANT
DRSG COVADERM 4X14 (GAUZE/BANDAGES/DRESSINGS) ×3 IMPLANT
ELECT BLADE 4.0 EZ CLEAN MEGAD (MISCELLANEOUS) ×3
ELECT REM PT RETURN 9FT ADLT (ELECTROSURGICAL) ×6
ELECTRODE BLDE 4.0 EZ CLN MEGD (MISCELLANEOUS) IMPLANT
ELECTRODE REM PT RTRN 9FT ADLT (ELECTROSURGICAL) ×4 IMPLANT
FELT TEFLON 1X6 (MISCELLANEOUS) ×6 IMPLANT
FIBERTAPE STERNAL CLSR 2 36IN (SUTURE) ×2 IMPLANT
FIBERTAPE STERNAL CLSR 2X36 (SUTURE) ×2 IMPLANT
GAUZE SPONGE 4X4 12PLY STRL (GAUZE/BANDAGES/DRESSINGS) ×7 IMPLANT
GLOVE BIO SURGEON STRL SZ 6 (GLOVE) ×1 IMPLANT
GLOVE BIO SURGEON STRL SZ 6.5 (GLOVE) ×3 IMPLANT
GLOVE BIO SURGEON STRL SZ7 (GLOVE) ×6 IMPLANT
GLOVE BIO SURGEON STRL SZ7.5 (GLOVE) ×5 IMPLANT
GLOVE BIOGEL M STRL SZ7.5 (GLOVE) ×6 IMPLANT
GLOVE BIOGEL PI IND STRL 6.5 (GLOVE) IMPLANT
GLOVE BIOGEL PI INDICATOR 6.5 (GLOVE) ×3
GOWN STRL REUS W/ TWL LRG LVL3 (GOWN DISPOSABLE) ×8 IMPLANT
GOWN STRL REUS W/ TWL XL LVL3 (GOWN DISPOSABLE) ×4 IMPLANT
GOWN STRL REUS W/TWL LRG LVL3 (GOWN DISPOSABLE) ×24
GOWN STRL REUS W/TWL XL LVL3 (GOWN DISPOSABLE) ×6
HEMOSTAT POWDER SURGIFOAM 1G (HEMOSTASIS) ×9 IMPLANT
KIT BASIN OR (CUSTOM PROCEDURE TRAY) ×3 IMPLANT
KIT SUCTION CATH 14FR (SUCTIONS) ×3 IMPLANT
KIT TURNOVER KIT B (KITS) ×3 IMPLANT
KIT VASOVIEW HEMOPRO 2 VH 4000 (KITS) ×3 IMPLANT
LEAD PACING MYOCARDI (MISCELLANEOUS) ×3 IMPLANT
MARKER GRAFT CORONARY BYPASS (MISCELLANEOUS) ×9 IMPLANT
NS IRRIG 1000ML POUR BTL (IV SOLUTION) ×15 IMPLANT
PACK E OPEN HEART (SUTURE) ×3 IMPLANT
PACK OPEN HEART (CUSTOM PROCEDURE TRAY) ×3 IMPLANT
PAD ARMBOARD 7.5X6 YLW CONV (MISCELLANEOUS) ×6 IMPLANT
PAD ELECT DEFIB RADIOL ZOLL (MISCELLANEOUS) ×3 IMPLANT
PENCIL BUTTON HOLSTER BLD 10FT (ELECTRODE) ×4 IMPLANT
POSITIONER HEAD DONUT 9IN (MISCELLANEOUS) ×3 IMPLANT
PUNCH AORTIC ROTATE 4.0MM (MISCELLANEOUS) ×3 IMPLANT
SET CARDIOPLEGIA MPS 5001102 (MISCELLANEOUS) ×1 IMPLANT
SPONGE LAP 18X18 X RAY DECT (DISPOSABLE) ×2 IMPLANT
SUT BONE WAX W31G (SUTURE) ×3 IMPLANT
SUT ETHIBOND X763 2 0 SH 1 (SUTURE) ×7 IMPLANT
SUT MNCRL AB 3-0 PS2 18 (SUTURE) ×6 IMPLANT
SUT PDS AB 1 CTX 36 (SUTURE) ×6 IMPLANT
SUT PROLENE 4 0 SH DA (SUTURE) ×5 IMPLANT
SUT PROLENE 5 0 C 1 36 (SUTURE) ×12 IMPLANT
SUT PROLENE 6 0 C 1 30 (SUTURE) ×1 IMPLANT
SUT PROLENE 7 0 BV1 MDA (SUTURE) ×4 IMPLANT
SUT STEEL 6MS V (SUTURE) ×5 IMPLANT
SUT VIC AB 2-0 CT1 27 (SUTURE) ×9
SUT VIC AB 2-0 CT1 TAPERPNT 27 (SUTURE) IMPLANT
SUT VIC AB 3-0 X1 27 (SUTURE) ×3 IMPLANT
SYSTEM SAHARA CHEST DRAIN ATS (WOUND CARE) ×3 IMPLANT
TAPE CLOTH SURG 4X10 WHT LF (GAUZE/BANDAGES/DRESSINGS) ×1 IMPLANT
TOWEL GREEN STERILE (TOWEL DISPOSABLE) ×3 IMPLANT
TOWEL GREEN STERILE FF (TOWEL DISPOSABLE) ×3 IMPLANT
TRAY FOLEY SLVR 16FR TEMP STAT (SET/KITS/TRAYS/PACK) ×3 IMPLANT
TUBE SUCT INTRACARD DLP 20F (MISCELLANEOUS) ×3 IMPLANT
TUBE SUCTION CARDIAC 10FR (CANNULA) ×3 IMPLANT
TUBING LAP HI FLOW INSUFFLATIO (TUBING) ×3 IMPLANT
UNDERPAD 30X30 (UNDERPADS AND DIAPERS) ×3 IMPLANT
WATER STERILE IRR 1000ML POUR (IV SOLUTION) ×6 IMPLANT

## 2020-03-03 NOTE — Progress Notes (Signed)
RT NOTE:  Cardiac rapid wean initiated. Pt follows commands.

## 2020-03-03 NOTE — Procedures (Signed)
Extubation Procedure Note  Pt extubated following cardiac rapid wean. Pt follows all command. NIF -26, VC 673mls, + cuff leak, no stridor. Strong cough. Pt on 4L [place]. ABG acceptable.   Patient Details:   Name: Judy [name] DOB: [birth year] MRN: [id number]   Airway Documentation:    Vent end date: 03/03/20 Vent end time: 2045   Evaluation  O2 sats: stable throughout Complications: No apparent complications Patient did tolerate procedure well. Bilateral Breath Sounds: Clear, Diminished   Yes  Marissa Nestle 03/03/2020, 8:46 PM

## 2020-03-03 NOTE — Brief Op Note (Signed)
02/29/2020 - 03/03/2020  8:36 AM  PATIENT:  Judy [name]  79 y.o. female  PRE-OPERATIVE DIAGNOSIS:  cad  POST-OPERATIVE DIAGNOSIS:  cad  PROCEDURE:  Procedure(s): CORONARY ARTERY BYPASS GRAFTING (CABG), ON PUMP, TIMES FIVE, USING LEFT INTERNAL MAMMARY ARTERY AND ENDOSCOPICALLY HARVESTED BILATERAL GREATER SAPHENOUS VEINS -flow track only (N/A) TRANSESOPHAGEAL ECHOCARDIOGRAM (TEE) (N/A) LIMA-LAD  SVG-DIAG SVG-PL SVG-OM1 SVG-OM2  SURGEON:  Surgeon(s) and Role:    * Lightfoot, Lucile Crater, MD - Primary  PHYSICIAN ASSISTANT: Alleyah Twombly PA-C  ANESTHESIA:   general  EBL:  460 mL   BLOOD ADMINISTERED:none  DRAINS: LEFT PLEURAL AND MEDIASTINAL CHEST TUBES   LOCAL MEDICATIONS USED:  NONE  SPECIMEN:  No Specimen  DISPOSITION OF SPECIMEN:  N/A  COUNTS:  YES  TOURNIQUET:  * No tourniquets in log *  DICTATION: .Dragon Dictation  PLAN OF CARE: Admit to inpatient   PATIENT DISPOSITION:  ICU - intubated and hemodynamically stable.   Delay start of Pharmacological VTE agent (>24hrs) due to surgical blood loss or risk of bleeding: yes

## 2020-03-03 NOTE — Anesthesia Procedure Notes (Signed)
Central Venous Catheter Insertion Performed by: Suzette Battiest, MD, anesthesiologist Patient location: Pre-op. Preanesthetic checklist: patient identified, IV checked, site marked, risks and benefits discussed, surgical consent, monitors and equipment checked, pre-op evaluation, timeout performed and anesthesia consent Position: Trendelenburg Lidocaine 1% used for infiltration and patient sedated Hand hygiene performed , maximum sterile barriers used  and Seldinger technique used Catheter size: 8.5 Fr Total catheter length 10. Central line was placed.Sheath introducer Swan type:thermodilution Procedure performed using ultrasound guided technique. Ultrasound Notes:anatomy identified, needle tip was noted to be adjacent to the nerve/plexus identified, no ultrasound evidence of intravascular and/or intraneural injection and image(s) printed for medical record Attempts: 1 Following insertion, line sutured, dressing applied and Biopatch. Post procedure assessment: blood return through all ports, free fluid flow and no air  Patient tolerated the procedure well with no immediate complications. Additional procedure comments: Triple lumen slick catheter inserted through.

## 2020-03-03 NOTE — Anesthesia Procedure Notes (Signed)
Procedure Name: Intubation Date/Time: 03/03/2020 8:12 AM Performed by: Kyung Rudd, CRNA Pre-anesthesia Checklist: Patient identified, Emergency Drugs available, Suction available and Patient being monitored Patient Re-evaluated:Patient Re-evaluated prior to induction Oxygen Delivery Method: Circle System Utilized Preoxygenation: Pre-oxygenation with 100% oxygen Induction Type: IV induction Ventilation: Mask ventilation with difficulty, Oral airway inserted - appropriate to patient size and Two handed mask ventilation required Laryngoscope Size: Glidescope and 4 Grade View: Grade I Tube type: Oral Tube size: 7.0 mm Number of attempts: 1 Airway Equipment and Method: Stylet and Oral airway Placement Confirmation: ETT inserted through vocal cords under direct vision,  positive ETCO2 and breath sounds checked- equal and bilateral Secured at: 22 cm Tube secured with: Tape Dental Injury: Teeth and Oropharynx as per pre-operative assessment  Difficulty Due To: Difficulty was anticipated, Difficult Airway- due to limited oral opening and Difficult Airway- due to anterior larynx Future Recommendations: Recommend- induction with short-acting agent, and alternative techniques readily available Comments: Performed by [place] Ingram SRNA

## 2020-03-03 NOTE — Anesthesia Procedure Notes (Signed)
Arterial Line Insertion Start/End4/12/2019 7:00 AM, 03/03/2020 7:15 AM Performed by: Kyung Rudd, CRNA, CRNA  Patient location: Pre-op. Preanesthetic checklist: patient identified, IV checked, site marked, risks and benefits discussed, surgical consent, monitors and equipment checked, pre-op evaluation, timeout performed and anesthesia consent Lidocaine 1% used for infiltration Left, radial was placed Catheter size: 20 G Hand hygiene performed , maximum sterile barriers used  and Seldinger technique used Allen's test indicative of satisfactory collateral circulation Attempts: 1 Procedure performed without using ultrasound guided technique. Following insertion, dressing applied and Biopatch. Post procedure assessment: normal  Patient tolerated the procedure well with no immediate complications. Additional procedure comments: Performed by Martin[place] Ingram SRNA.

## 2020-03-03 NOTE — Progress Notes (Signed)
  Echocardiogram Echocardiogram Transesophageal has been performed.  Judy [name] 03/03/2020, 8:45 AM

## 2020-03-03 NOTE — Op Note (Signed)
[address]       [place] [address]             [phone]                                          03/03/2020 Patient:  Judy [name] Pre-Op Dx: NSTEMI   Three-vessel coronary disease   Congestive heart failure with an EF of 45%   Cerebellar infarct   Diabetes mellitus Post-op Dx: Same Procedure: CABG X 5.  LIMA to LAD, R SVG to OM 2, OM, 3 first diagonal and PLV.  The OM 3 graft was jumped off the hood of the first diagonal graft Endoscopic greater saphenous vein harvest on the right and left Intra-operative Transesophageal Echocardiogram  Surgeon and Role:      * Leonna Schlee, Lucile Crater, MD - Primary  Assistant: Evonnie Pat, PA-C  Anesthesia  general EBL: 500 ml Blood Administration: None   Drains: 76 F blake drain:  L, mediastinal  Wires: None Counts: correct   Indications: 79 year old female admitted following an NSTEMI and severe three-vessel coronary disease..  Following her left heart cath she had altered mental status and an MRI showed a small cerebellar infarct.  Her mental status subsequently improved to the following day and she was cleared by neurology for systemic heparinization.  Findings: Good LIMA and vein conduit.  The PDA was too small for bypass.  The PLV branch was a good target.  There was sluggish flow on the third marginal but the second marginal was right adjacent to recover the same territory and had much improved flows.  The diet was a good target.  She had good functioning post cardiopulmonary bypass.  Operative Technique: All invasive lines were placed in pre-op holding.  After the risks, benefits and alternatives were thoroughly discussed, the patient was brought to the operative theatre.  Anesthesia was induced, and the patient was prepped and draped in normal sterile fashion.  An appropriate surgical pause was performed, and pre-operative antibiotics were dosed accordingly.  We began with simultaneous incisions were made  along the left and right leg for harvesting of the greater saphenous vein and the chest for the sternotomy.  In regards to the sternotomy, this was carried down with bovie cautery, and the sternum was divided with a reciprocating saw.  Meticulous hemostasis was obtained.  The left internal thoracic artery was exposed and harvested in in pedicled fashion.  The patient was systemically heparinized, and the artery was divided distally, and placed in a papaverine sponge.    The sternal elevator was removed, and a retractor was placed.  The pericardium was divided in the midline and fashioned into a cradle with pericardial stitches.   After we confirmed an appropriate ACT, the ascending aorta was cannulated in standard fashion.  The right atrial appendage was used for venous cannulation site.  Cardiopulmonary bypass was initiated, and the heart retractor was placed. The cross clamp was applied, and a dose of anterograde cardioplegia was given with good arrest of the heart.  We moved to the posterior wall of the heart, and found a good target on the PLV.  An arteriotomy was made, and the vein graft was anastomosed to it in an end to side fashion.  Next we exposed the lateral wall, and found a good target on the OM 2 and  OM 3.  An end to side anastomosis with the vein graft was then created to each.  Next, we exposed the anterior wall of the heart and identified a good target on first diagonal branch.   An arteriotomy was created.  The vein was anastomosed in an end to side fashion.  Finally, we exposed a good target on the LAD, and fashioned an end to side anastomosis between it and the LITA.  We began to re-warm, and a re-animation dose of cardioplegia was given.  The heart was de-aired, and the cross clamp was removed.  Meticulous hemostasis was obtained.    A partial occludding clamp was then placed on the ascending aorta, and we created an end to side anastomosis between it and the proximal vein grafts.  The  proximal sites were marked with rings.  Hemostasis was obtained, and we separated from cardiopulmonary bypass without event.the heparin was reversed with protamine.  Chest tubes and wires were placed, and the sternum was re-approximated with with sternal wires.  The soft tissue and skin were re-approximated wth absorbable suture.    The patient tolerated the procedure without any immediate complications, and was transferred to the ICU in guarded condition.  Judy [name]

## 2020-03-03 NOTE — Transfer of Care (Signed)
Immediate Anesthesia Transfer of Care Note  Patient: Judy [name]  Procedure(s) Performed: CORONARY ARTERY BYPASS GRAFTING (CABG), ON PUMP, TIMES FIVE, USING LEFT INTERNAL MAMMARY ARTERY AND ENDOSCOPICALLY HARVESTED BILATERAL GREATER SAPHENOUS VEINS -flow track only (N/A Chest) TRANSESOPHAGEAL ECHOCARDIOGRAM (TEE) (N/A )  Patient Location: SICU  Anesthesia Type:General  Level of Consciousness: sedated and Patient remains intubated per anesthesia plan  Airway & Oxygen Therapy: Patient remains intubated per anesthesia plan and Patient placed on Ventilator (see vital sign flow sheet for setting)  Post-op Assessment: Report given to RN and Post -op Vital signs reviewed and stable  Post vital signs: Reviewed and stable  Last Vitals:  Vitals Value Taken Time  BP 126/76 03/03/20 1409  Temp    Pulse 71 03/03/20 1420  Resp 29 03/03/20 1420  SpO2 95 % 03/03/20 1420  Vitals shown include unvalidated device data.  Last Pain:  Vitals:   03/03/20 0556  TempSrc: Oral  PainSc:       Patients Stated Pain Goal: 0 ([id number])  Complications: No apparent anesthesia complications

## 2020-03-03 NOTE — Progress Notes (Signed)
STROKE TEAM PROGRESS NOTE   INTERVAL HISTORY Patient had 5 vessel CABG surgery earlier today.  She is sedated and intubated.  She opens eyes to stimulus but does not consistently follow commands.  Vital signs are stable. Vitals:   03/03/20 1515 03/03/20 1530 03/03/20 1545 03/03/20 1600  BP:  129/76  115/70  Pulse: 80 85 71 81  Resp: (!) 23 20 (!) 22 14  Temp: (!) 96.3 F (35.7 C) (!) 96.4 F (35.8 C) (!) 96.6 F (35.9 C) (!) 96.8 F (36 C)  TempSrc:      SpO2: 99% 99% 100% 100%  Weight:      Height:        CBC:  Recent Labs  Lab 02/29/20 1037 03/01/20 0351 03/03/20 0511 03/03/20 0830 03/03/20 1156 03/03/20 1218 03/03/20 1307 03/03/20 1420  WBC 9.4   < > 10.0  --   --   --   --  10.0  NEUTROABS 7.8*  --   --   --   --   --   --   --   HGB 14.0   < > 12.3   < > 7.5*   < > 8.2* 7.7*  HCT 43.9   < > 37.7   < > 22.6*   < > 24.0* 23.9*  MCV 93.8   < > 91.5  --   --   --   --  93.4  PLT 285   < > 241   < > 158  --   --  106*   < > = values in this interval not displayed.    Basic Metabolic Panel:  Recent Labs  Lab 03/01/20 0351 03/01/20 0351 03/03/20 0511 03/03/20 0830 03/03/20 1222 03/03/20 1222 03/03/20 1303 03/03/20 1307  NA 138   < > 137   < > 135   < > 137 136  K 3.9   < > 3.6   < > 4.7   < > 4.0 3.9  CL 101   < > 101   < > 102  --   --  103  CO2 28  --  25  --   --   --   --   --   GLUCOSE 119*   < > 103*   < > 131*  --   --  125*  BUN 20   < > 20   < > 22  --   --  21  CREATININE 1.16*   < > 1.25*   < > 0.80  --   --  0.90  CALCIUM 9.2  --  9.0  --   --   --   --   --    < > = values in this interval not displayed.   Lipid Panel:     Component Value Date/Time   CHOL 89 03/01/2020 0351   CHOL 184 06/19/2018 0958   TRIG 101 03/01/2020 0351   HDL 39 (L) 03/01/2020 0351   HDL 45 06/19/2018 0958   CHOLHDL 2.3 03/01/2020 0351   VLDL 20 03/01/2020 0351   LDLCALC 30 03/01/2020 0351   LDLCALC 107 (H) 06/19/2018 0958   HgbA1c:  Lab Results  Component  Value Date   HGBA1C 7.1 (H) 02/09/2020   Urine Drug Screen: No results found for: LABOPIA, COCAINSCRNUR, LABBENZ, AMPHETMU, THCU, LABBARB  Alcohol Level No results found for: ETH  IMAGING past 24 hours DG Chest Port 1 View  Result Date: 03/03/2020 CLINICAL DATA:  Hypoxia EXAM: PORTABLE CHEST 1 VIEW COMPARISON:  February 29, 2020 FINDINGS: Endotracheal tube tip is 2.3 cm above the carina. Nasogastric tube tip and side port above the diaphragm. Central catheter tip is in the superior vena cava. There is a left chest tube and an apparent mediastinal drain. No pneumothorax. There is an ill-defined area of opacity in the left lower lobe adjacent to the left heart border. Lungs elsewhere clear. Heart is upper normal in size with pulmonary vascularity normal. Status post coronary artery bypass grafting. No adenopathy. No bone lesions. IMPRESSION: Tube and catheter positions as described without pneumothorax. Postoperative changes. Focal consolidation left lower lobe which may represent localized atelectasis or possibly early developing pneumonia. Lungs elsewhere clear. Heart upper normal in size. Electronically Signed   By: Lowella Grip III M.D.   On: 03/03/2020 14:35   ECHO INTRAOPERATIVE TEE  Result Date: 03/03/2020  *INTRAOPERATIVE TRANSESOPHAGEAL REPORT *  Patient Name:   Judy [name] [facility] Date of Exam: 03/03/2020 Medical Rec #:  [id number]      Height:       60.0 in Accession #:    [id number]     Weight:       182.9 lb Date of Birth:  02/17/1941      BSA:          1.80 m Patient Age:    79 years       BP:           117/72 mmHg Patient Gender: F              HR:           60 bpm. Exam Location:  Inpatient Transesophogeal exam was perform intraoperatively during surgical procedure. Patient was closely monitored under general anesthesia during the entirety of examination. Indications:     coronary artery bypass Performing Phys: [id number] Lucile Crater LIGHTFOOT Diagnosing Phys: Suzette Battiest MD Complications: No  known complications during this procedure. POST-OP IMPRESSIONS - Left Ventricle: The left ventricle is unchanged from pre-bypass. - Right Ventricle: The right ventricle appears unchanged from pre-bypass. - Aorta: The aorta appears unchanged from pre-bypass. - Left Atrium: The left atrium appears unchanged from pre-bypass. - Left Atrial Appendage: The left atrial appendage appears unchanged from pre-bypass. - Aortic Valve: The aortic valve appears unchanged from pre-bypass. - Mitral Valve: The mitral valve appears unchanged from pre-bypass. - Tricuspid Valve: The tricuspid valve appears unchanged from pre-bypass. - Interatrial Septum: The interatrial septum appears unchanged from pre-bypass. - Interventricular Septum: The interventricular septum appears unchanged from pre-bypass. - Pericardium: The pericardium appears unchanged from pre-bypass. PRE-OP FINDINGS  Left Ventricle: The left ventricle has low normal systolic function, with an ejection fraction of 50-55%. The cavity size was normal. There is mildly increased left ventricular wall thickness. Right Ventricle: The right ventricle has normal systolic function. The cavity was normal. There is no increase in right ventricular wall thickness. Left Atrium: Left atrial size was normal in size.  Interatrial Septum: No atrial level shunt detected by color flow Doppler. Pericardium: Trivial pericardial effusion is present. Mitral Valve: The mitral valve is normal in structure. Mild thickening of the mitral valve leaflet. Mitral valve regurgitation is mild to moderate by color flow Doppler. The MR jet is centrally-directed. Tricuspid Valve: The tricuspid valve was normal in structure. Tricuspid valve regurgitation is trivial by color flow Doppler. Aortic Valve: The aortic valve is tricuspid Aortic valve regurgitation was not visualized by color flow Doppler. There is no stenosis of the aortic  valve. Pulmonic Valve: The pulmonic valve was normal in structure. Pulmonic  valve regurgitation is trivial by color flow Doppler. Aorta: The aortic root, ascending aorta and aortic arch are normal in size and structure. There is evidence of plaque in the descending aorta. +--------------+--------++ LEFT VENTRICLE         +--------------+--------++ PLAX 2D                +--------------+--------++ LVOT diam:    2.10 cm  +--------------+--------++ LVOT Area:    3.46 cm +--------------+--------++                        +--------------+--------++  +--------------+-------+ SHUNTS                +--------------+-------+ Systemic Diam:2.10 cm +--------------+-------+  Suzette Battiest MD Electronically signed by Suzette Battiest MD Signature Date/Time: 03/03/2020/1:46:03 PM    Final     PHYSICAL EXAM pleasant obese elderly Caucasian who is intubated and sedated not in distress. Neurological Exam ;  Patient is sedated and intubated.  She opens eyes to tactile stimuli and tries to follow occasional midline commands like sticking out her tongue.  Eyes in primary position.  Pupils equal reactive.  Doll's eye movement sluggish.  Does not blink to threat bilaterally..fundi were not visualized. . Face symmetric. Tongue midline.  Does withdraw all 4 extremities slightly to noxious stimuli.   ASSESSMENT/PLAN Ms. APREL GLASBY is a 79 y.o. female with history of sleep apnea, leg edema, hypertension, diabetes admitted for CP, elevated cardiac enzymes. S/p cardiac cath developed word finding difficulties, altered mentation.   Coronary artery disease NSTEMI  On IV heparin  Plans for  CABG  Need neuro clearance for heparin during procedure    Stroke:   Incidental tiny R cerebellar infarct post cardiac cath, not related to symptoms, embolic d/t cath  CT head No acute abnormality. Stable Small vessel disease and Atrophy.   CTA head & neck no ELVO. L P2 mild narrowing. B ICA bifurcation w/ minimal atherosclerosis. B VA atherosclerosis. B cavernous ICA  atherosclerosis. Multilevel spondylosis of CSpine.   MRI  Subcentimeter R cerebellar infarct. Moderate small vessel disease and atrophy  2D Echo EF 45-50%. No source of embolus   LDL 30  HgbA1c 7.1  IV heparin for VTE prophylaxis  No antithrombotic prior to admission, now on aspirin 81 mg daily and heparin IV.   Therapy recommendations:  Recommend PT and OT evals post CABG  Disposition:  pending   Hypertension  Stable on the low side . Permissive hypertension (OK if < 220/120) but gradually normalize in 5-7 days . Long-term BP goal normotensive  Hyperlipidemia  Home meds:  liptior 20   Now on lipitor 80  LDL 30, goal < 70  Continue statin at discharge  Diabetes type II Uncontrolled  HgbA1c 7.1, goal < 7.0  Other Stroke Risk Factors  Advanced age  Obesity, Body mass index is 35.72 kg/m., recommend weight loss, diet and exercise as appropriate   Obstructive sleep apnea  Hx COVD PNA 12/03/2019  Hospital day # 2 Continue perioperative post CABG care as per CVTS team.  We will repeat neurological eval in the morning when she is off sedation.  Continue ongoing treatment.This patient is critically ill and at significant risk of neurological worsening, death and care requires constant monitoring of vital signs, hemodynamics,respiratory and cardiac monitoring, extensive review of multiple databases, frequent neurological assessment, discussion with family, other specialists and medical decision making  of high complexity.I have made any additions or clarifications directly to the above note.This critical care time does not reflect procedure time, or teaching time or supervisory time of PA/NP/Med Resident etc but could involve care discussion time.  I spent 30 minutes of neurocritical care time  in the care of  this patient.     Antony Contras, MD To contact Stroke Continuity provider, please refer to [email or web address] After hours, contact General Neurology

## 2020-03-03 NOTE — Progress Notes (Signed)
     [place]       , [id number]             [phone]       No events Vitals:   03/03/20 0556 03/03/20 0602  BP: 121/75 119/65  Pulse: 71 73  Resp: 18   Temp: 98.1 F (36.7 C)   SpO2: 96%    Alert NAD Sinus EWOB  79 yo female presents with NSTEMI, and 3V CAD.  Small cerebellar CVA  OR today for CABG 4-5

## 2020-03-04 ENCOUNTER — Encounter: Payer: Self-pay | Admitting: *Deleted

## 2020-03-04 ENCOUNTER — Encounter (HOSPITAL_COMMUNITY)
Admission: EM | Disposition: A | Discharge status: Home / Self Care | Payer: Self-pay | Source: Home / Self Care | Attending: Thoracic Surgery (Cardiothoracic Vascular Surgery)

## 2020-03-04 ENCOUNTER — Inpatient Hospital Stay (HOSPITAL_COMMUNITY): Payer: Medicare Other

## 2020-03-04 LAB — MAGNESIUM
Magnesium: 2.5 mg/dL — ABNORMAL HIGH (ref 1.7–2.4)
Magnesium: 2.4 mg/dL (ref 1.7–2.4)

## 2020-03-04 LAB — BASIC METABOLIC PANEL
Anion gap: 9 (ref 5–15)
Anion gap: 3 — ABNORMAL LOW (ref 5–15)
BUN: 18 mg/dL (ref 8–23)
BUN: 19 mg/dL (ref 8–23)
CO2: 20 mmol/L — ABNORMAL LOW (ref 22–32)
CO2: 23 mmol/L (ref 22–32)
Calcium: 8 mg/dL — ABNORMAL LOW (ref 8.9–10.3)
Calcium: 7.9 mg/dL — ABNORMAL LOW (ref 8.9–10.3)
Chloride: 106 mmol/L (ref 98–111)
Chloride: 108 mmol/L (ref 98–111)
Creatinine, Ser: 1.15 mg/dL — ABNORMAL HIGH (ref 0.44–1.00)
Creatinine, Ser: 1.3 mg/dL — ABNORMAL HIGH (ref 0.44–1.00)
GFR calc Af Amer: 53 mL/min — ABNORMAL LOW (ref >60)
GFR calc Af Amer: 46 mL/min — ABNORMAL LOW (ref >60)
GFR calc non Af Amer: 46 mL/min — ABNORMAL LOW (ref >60)
GFR calc non Af Amer: 40 mL/min — ABNORMAL LOW (ref >60)
Glucose, Bld: 119 mg/dL — ABNORMAL HIGH (ref 70–99)
Glucose, Bld: 171 mg/dL — ABNORMAL HIGH (ref 70–99)
Potassium: 4.4 mmol/L (ref 3.5–5.1)
Potassium: 4.1 mmol/L (ref 3.5–5.1)
Sodium: 135 mmol/L (ref 135–145)
Sodium: 134 mmol/L — ABNORMAL LOW (ref 135–145)

## 2020-03-04 LAB — GLUCOSE, CAPILLARY
Glucose-Capillary: 110 mg/dL — ABNORMAL HIGH (ref 70–99)
Glucose-Capillary: 115 mg/dL — ABNORMAL HIGH (ref 70–99)
Glucose-Capillary: 116 mg/dL — ABNORMAL HIGH (ref 70–99)
Glucose-Capillary: 119 mg/dL — ABNORMAL HIGH (ref 70–99)
Glucose-Capillary: 122 mg/dL — ABNORMAL HIGH (ref 70–99)
Glucose-Capillary: 130 mg/dL — ABNORMAL HIGH (ref 70–99)
Glucose-Capillary: 132 mg/dL — ABNORMAL HIGH (ref 70–99)
Glucose-Capillary: 132 mg/dL — ABNORMAL HIGH (ref 70–99)
Glucose-Capillary: 139 mg/dL — ABNORMAL HIGH (ref 70–99)
Glucose-Capillary: 142 mg/dL — ABNORMAL HIGH (ref 70–99)
Glucose-Capillary: 144 mg/dL — ABNORMAL HIGH (ref 70–99)
Glucose-Capillary: 145 mg/dL — ABNORMAL HIGH (ref 70–99)
Glucose-Capillary: 99 mg/dL (ref 70–99)

## 2020-03-04 LAB — CBC
HCT: 20.9 % — ABNORMAL LOW (ref 36.0–46.0)
HCT: 26.2 % — ABNORMAL LOW (ref 36.0–46.0)
Hemoglobin: 6.7 g/dL — CL (ref 12.0–15.0)
Hemoglobin: 8.7 g/dL — ABNORMAL LOW (ref 12.0–15.0)
MCH: 30.2 pg (ref 26.0–34.0)
MCH: 30.9 pg (ref 26.0–34.0)
MCHC: 32.1 g/dL (ref 30.0–36.0)
MCHC: 33.2 g/dL (ref 30.0–36.0)
MCV: 94.1 fL (ref 80.0–100.0)
MCV: 92.9 fL (ref 80.0–100.0)
Platelets: 145 10*3/uL — ABNORMAL LOW (ref 150–400)
Platelets: 160 10*3/uL (ref 150–400)
RBC: 2.22 MIL/uL — ABNORMAL LOW (ref 3.87–5.11)
RBC: 2.82 MIL/uL — ABNORMAL LOW (ref 3.87–5.11)
RDW: 13.3 % (ref 11.5–15.5)
RDW: 13.3 % (ref 11.5–15.5)
WBC: 10.4 10*3/uL (ref 4.0–10.5)
WBC: 11.8 10*3/uL — ABNORMAL HIGH (ref 4.0–10.5)
nRBC: 0 % (ref 0.0–0.2)
nRBC: 0 % (ref 0.0–0.2)

## 2020-03-04 LAB — PREPARE RBC (CROSSMATCH)

## 2020-03-04 SURGERY — CORONARY ARTERY BYPASS GRAFTING (CABG)
Anesthesia: General | Site: Chest

## 2020-03-04 MED ORDER — ENOXAPARIN SODIUM 30 MG/0.3ML ~~LOC~~ SOLN
30.0000 mg | Freq: Every day | SUBCUTANEOUS | Status: DC
  Administered 2020-03-04 – 2020-03-07 (×4): 30 mg via SUBCUTANEOUS
  Filled 2020-03-04 (×4): qty 0.3

## 2020-03-04 MED ORDER — INSULIN DETEMIR 100 UNIT/ML ~~LOC~~ SOLN
20.0000 [IU] | Freq: Every day | SUBCUTANEOUS | Status: DC
  Filled 2020-03-04: qty 0.2

## 2020-03-04 MED ORDER — INSULIN ASPART 100 UNIT/ML ~~LOC~~ SOLN
0.0000 [IU] | SUBCUTANEOUS | Status: DC
  Administered 2020-03-04 (×3): 2 [IU] via SUBCUTANEOUS

## 2020-03-04 MED ORDER — INSULIN DETEMIR 100 UNIT/ML ~~LOC~~ SOLN
20.0000 [IU] | Freq: Once | SUBCUTANEOUS | Status: AC
  Administered 2020-03-04: 20 [IU] via SUBCUTANEOUS
  Filled 2020-03-04: qty 0.2

## 2020-03-04 NOTE — Progress Notes (Signed)
Cardiology Progress Note  Patient ID: Judy [name] MRN: [id number] DOB: February 14, 1941 Date of Encounter: 03/04/2020  Primary Cardiologist: No primary care provider on file.  Subjective  Doing well post CABG. Up in chair. Pacing wires out. No CP.   ROS:  All other ROS reviewed and negative. Pertinent positives noted in the HPI.     Inpatient Medications  Scheduled Meds: . acetaminophen  1,000 mg Oral Q6H   Or  . acetaminophen (TYLENOL) oral liquid 160 mg/5 mL  1,000 mg Per Tube Q6H  . aspirin EC  325 mg Oral Daily   Or  . aspirin  324 mg Per Tube Daily  . atorvastatin  80 mg Oral q1800  . bisacodyl  10 mg Oral Daily   Or  . bisacodyl  10 mg Rectal Daily  . Chlorhexidine Gluconate Cloth  6 each Topical Daily  . docusate sodium  200 mg Oral Daily  . enoxaparin (LOVENOX) injection  30 mg Subcutaneous QHS  . insulin aspart  0-24 Units Subcutaneous Q4H  . [START ON 03/05/2020] insulin detemir  20 Units Subcutaneous Daily  . mouth rinse  15 mL Mouth Rinse BID  . metoprolol tartrate  12.5 mg Oral BID   Or  . metoprolol tartrate  12.5 mg Per Tube BID  . mupirocin ointment  1 application Nasal BID  . [START ON 03/05/2020] pantoprazole  40 mg Oral Daily  . sodium chloride flush  3 mL Intravenous Q12H   Continuous Infusions: . sodium chloride Stopped (03/04/20 0827)  . sodium chloride    . sodium chloride    . albumin human Stopped (03/04/20 0321)  . cefUROXime (ZINACEF)  IV Stopped (03/04/20 0011)  . dexmedetomidine (PRECEDEX) IV infusion Stopped (03/03/20 1443)  . insulin 0.8 mL/hr at 03/04/20 1100  . lactated ringers    . lactated ringers    . lactated ringers Stopped (03/04/20 0828)   PRN Meds: sodium chloride, albumin human, dextrose, lactated ringers, metoprolol tartrate, midazolam, morphine injection, ondansetron (ZOFRAN) IV, oxyCODONE, sodium chloride flush, traMADol   Vital Signs   Vitals:   03/04/20 1030 03/04/20 1033 03/04/20 1045 03/04/20 1100  BP:  (!) 82/67  (!)  119/94  Pulse: 81 (!) 45 80 78  Resp: (!) 22 16 (!) 23 (!) 21  Temp:  98.3 F (36.8 C)    TempSrc:  Oral    SpO2: 97% (!) 74% 95% 94%  Weight:      Height:        Intake/Output Summary (Last 24 hours) at 03/04/2020 1112 Last data filed at 03/04/2020 1100 Gross per 24 hour  Intake 5087.75 ml  Output 2785 ml  Net 2302.75 ml   Last 3 Weights 03/04/2020 03/03/2020 03/01/2020  Weight (lbs) 192 lb 14.4 oz 182 lb 14.4 oz 179 lb 10.8 oz  Weight (kg) 87.5 kg 82.963 kg 81.5 kg      Telemetry  Overnight telemetry shows sinus rhythm 70-80 bpm, which I personally reviewed.   ECG  The most recent ECG shows sinus bradycardia, inferior Q waves noted, which I personally reviewed.   Physical Exam   Vitals:   03/04/20 1030 03/04/20 1033 03/04/20 1045 03/04/20 1100  BP:  (!) 82/67  (!) 119/94  Pulse: 81 (!) 45 80 78  Resp: (!) 22 16 (!) 23 (!) 21  Temp:  98.3 F (36.8 C)    TempSrc:  Oral    SpO2: 97% (!) 74% 95% 94%  Weight:      Height:  Intake/Output Summary (Last 24 hours) at 03/04/2020 1112 Last data filed at 03/04/2020 1100 Gross per 24 hour  Intake 5087.75 ml  Output 2785 ml  Net 2302.75 ml    Last 3 Weights 03/04/2020 03/03/2020 03/01/2020  Weight (lbs) 192 lb 14.4 oz 182 lb 14.4 oz 179 lb 10.8 oz  Weight (kg) 87.5 kg 82.963 kg 81.5 kg    Body mass index is 37.67 kg/m.   General: Well nourished, well developed, in no acute distress Head: Atraumatic, normal size  Eyes: PEERLA, EOMI  Neck: Supple, no JVD Endocrine: No thryomegaly Cardiac: Normal S1, S2 +rub Lungs: diminished breath sounds bilaterally  Abd: Soft, nontender, no hepatomegaly  Ext: 1+ edema Musculoskeletal: No deformities, BUE and BLE strength normal and equal Skin: Warm and dry, no rashes   Neuro: Alert and oriented to person, place, time, and situation, CNII-XII grossly intact, no focal deficits  Psych: Normal mood and affect   Labs  High Sensitivity Troponin:   Recent Labs  Lab 02/29/20 1037  02/29/20 1309 02/29/20 1732 03/01/20 0832 03/01/20 1010  TROPONINIHS 61* 302* 2,127* 13,775* 12,678*     Cardiac EnzymesNo results for input(s): TROPONINI in the last 168 hours. No results for input(s): TROPIPOC in the last 168 hours.  Chemistry Recent Labs  Lab 02/29/20 1037 03/01/20 0351 03/03/20 0511 03/03/20 0830 03/03/20 1307 03/03/20 1422 03/03/20 2023 03/03/20 2023 03/03/20 2038 03/03/20 2212 03/04/20 0422  NA 139   < > 137   < > 136   < > 137   < > 140 138 135  K 4.3   < > 3.6   < > 3.9   < > 5.2*   < > 4.5 4.8 4.4  CL 100   < > 101   < > 103  --  109  --   --   --  106  CO2 26   < > 25  --   --   --  19*  --   --   --  20*  GLUCOSE 144*   < > 103*   < > 125*  --  133*  --   --   --  119*  BUN 18   < > 20   < > 21  --  20  --   --   --  18  CREATININE 1.18*   < > 1.25*   < > 0.90  --  1.12*  --   --   --  1.15*  CALCIUM 10.0   < > 9.0  --   --   --  8.0*  --   --   --  8.0*  PROT 7.1  --   --   --   --   --   --   --   --   --   --   ALBUMIN 4.1  --   --   --   --   --   --   --   --   --   --   AST 20  --   --   --   --   --   --   --   --   --   --   ALT 23  --   --   --   --   --   --   --   --   --   --   ALKPHOS 75  --   --   --   --   --   --   --   --   --   --  BILITOT 0.5  --   --   --   --   --   --   --   --   --   --   GFRNONAA 44*   < > 41*  --   --   --  47*  --   --   --  46*  GFRAA 52*   < > 48*  --   --   --  55*  --   --   --  53*  ANIONGAP 13   < > 11  --   --   --  9  --   --   --  9   < > = values in this interval not displayed.    Hematology Recent Labs  Lab 03/03/20 1420 03/03/20 1422 03/03/20 2023 03/03/20 2023 03/03/20 2038 03/03/20 2212 03/04/20 0422  WBC 10.0  --  15.0*  --   --   --  10.4  RBC 2.56*  --  2.62*  --   --   --  2.22*  HGB 7.7*   < > 7.9*   < > 6.8* 6.8* 6.7*  HCT 23.9*   < > 24.4*   < > 20.0* 20.0* 20.9*  MCV 93.4  --  93.1  --   --   --  94.1  MCH 30.1  --  30.2  --   --   --  30.2  MCHC 32.2  --  32.4  --    --   --  32.1  RDW 13.2  --  13.2  --   --   --  13.3  PLT 106*  --  168  --   --   --  145*   < > = values in this interval not displayed.   BNPNo results for input(s): BNP, PROBNP in the last 168 hours.  DDimer  Recent Labs  Lab 02/29/20 1037  DDIMER 0.57*     Radiology  DG Chest Port 1 View  Result Date: 03/04/2020 CLINICAL DATA:  Prior CABG.  Chest tube. EXAM: PORTABLE CHEST 1 VIEW COMPARISON:  03/03/2020. FINDINGS: Interim extubation and removal of NG tube. Right IJ line, mediastinal drainage catheter, and left chest tube in stable position. No pneumothorax. Prior CABG. Stable cardiomegaly. Low lung volumes with bibasilar atelectasis and infiltrates/edema noted on today's exam. Small left pleural effusion cannot be excluded. IMPRESSION: 1. Interim extubation and removal of NG tube. Remaining lines and tubes including left chest tube in stable position. No pneumothorax. 2.  Prior CABG.  Stable cardiomegaly. 3. Low lung volumes bibasilar atelectasis and infiltrates/edema noted on today's exam. Small left pleural effusion cannot be excluded. Electronically Signed   By: Marcello Moores  Register   On: 03/04/2020 07:21   DG Chest Port 1 View  Result Date: 03/03/2020 CLINICAL DATA:  Hypoxia EXAM: PORTABLE CHEST 1 VIEW COMPARISON:  February 29, 2020 FINDINGS: Endotracheal tube tip is 2.3 cm above the carina. Nasogastric tube tip and side port above the diaphragm. Central catheter tip is in the superior vena cava. There is a left chest tube and an apparent mediastinal drain. No pneumothorax. There is an ill-defined area of opacity in the left lower lobe adjacent to the left heart border. Lungs elsewhere clear. Heart is upper normal in size with pulmonary vascularity normal. Status post coronary artery bypass grafting. No adenopathy. No bone lesions. IMPRESSION: Tube and catheter positions as described without pneumothorax. Postoperative changes. Focal consolidation left lower lobe which may represent  localized  atelectasis or possibly early developing pneumonia. Lungs elsewhere clear. Heart upper normal in size. Electronically Signed   By: Lowella Grip III M.D.   On: 03/03/2020 14:35   ECHO INTRAOPERATIVE TEE  Result Date: 03/03/2020  *INTRAOPERATIVE TRANSESOPHAGEAL REPORT *  Patient Name:   TASHYIA BOWERMAN [facility] Date of Exam: 03/03/2020 Medical Rec #:  [id number]      Height:       60.0 in Accession #:    [id number]     Weight:       182.9 lb Date of Birth:  Aug 04, 1941      BSA:          1.80 m Patient Age:    79 years       BP:           117/72 mmHg Patient Gender: F              HR:           60 bpm. Exam Location:  Inpatient Transesophogeal exam was perform intraoperatively during surgical procedure. Patient was closely monitored under general anesthesia during the entirety of examination. Indications:     coronary artery bypass Performing Phys: [id number] Lucile Crater LIGHTFOOT Diagnosing Phys: Suzette Battiest MD Complications: No known complications during this procedure. POST-OP IMPRESSIONS - Left Ventricle: The left ventricle is unchanged from pre-bypass. - Right Ventricle: The right ventricle appears unchanged from pre-bypass. - Aorta: The aorta appears unchanged from pre-bypass. - Left Atrium: The left atrium appears unchanged from pre-bypass. - Left Atrial Appendage: The left atrial appendage appears unchanged from pre-bypass. - Aortic Valve: The aortic valve appears unchanged from pre-bypass. - Mitral Valve: The mitral valve appears unchanged from pre-bypass. - Tricuspid Valve: The tricuspid valve appears unchanged from pre-bypass. - Interatrial Septum: The interatrial septum appears unchanged from pre-bypass. - Interventricular Septum: The interventricular septum appears unchanged from pre-bypass. - Pericardium: The pericardium appears unchanged from pre-bypass. PRE-OP FINDINGS  Left Ventricle: The left ventricle has low normal systolic function, with an ejection fraction of 50-55%. The cavity size was normal. There  is mildly increased left ventricular wall thickness. Right Ventricle: The right ventricle has normal systolic function. The cavity was normal. There is no increase in right ventricular wall thickness. Left Atrium: Left atrial size was normal in size.  Interatrial Septum: No atrial level shunt detected by color flow Doppler. Pericardium: Trivial pericardial effusion is present. Mitral Valve: The mitral valve is normal in structure. Mild thickening of the mitral valve leaflet. Mitral valve regurgitation is mild to moderate by color flow Doppler. The MR jet is centrally-directed. Tricuspid Valve: The tricuspid valve was normal in structure. Tricuspid valve regurgitation is trivial by color flow Doppler. Aortic Valve: The aortic valve is tricuspid Aortic valve regurgitation was not visualized by color flow Doppler. There is no stenosis of the aortic valve. Pulmonic Valve: The pulmonic valve was normal in structure. Pulmonic valve regurgitation is trivial by color flow Doppler. Aorta: The aortic root, ascending aorta and aortic arch are normal in size and structure. There is evidence of plaque in the descending aorta. +--------------+--------++ LEFT VENTRICLE         +--------------+--------++ PLAX 2D                +--------------+--------++ LVOT diam:    2.10 cm  +--------------+--------++ LVOT Area:    3.46 cm +--------------+--------++                        +--------------+--------++  +--------------+-------+  SHUNTS                +--------------+-------+ Systemic Diam:2.10 cm +--------------+-------+  Suzette Battiest MD Electronically signed by Suzette Battiest MD Signature Date/Time: 03/03/2020/1:46:03 PM    Final     Cardiac Studies   TTE 02/29/2020 1. Left ventricular ejection fraction, by estimation, is 45 to 50%. The  left ventricle has mildly decreased function. The left ventricle  demonstrates regional wall motion abnormalities (see scoring  diagram/findings for  description). Inferior  hypokinesis. There is mild left ventricular hypertrophy. Left ventricular  diastolic parameters are indeterminate.  2. Right ventricular systolic function is normal. The right ventricular  size is normal.  3. The mitral valve is normal in structure. Trivial mitral valve  regurgitation.  4. The aortic valve was not well visualized. Aortic valve regurgitation  is not visualized. No aortic stenosis is present.   LHC 03/01/2020  Severe multivessel coronary disease  Segmental proximal to mid 90% LAD stenosis.  Distal flow is TIMI grade II.  Right coronary supplies LAD via septal perforator collaterals.  90% mid to distal LAD before the second obtuse marginal.  100% circumflex after the second obtuse marginal.  Total occlusion of distal RCA.  Left ventricular branch and PDA fill by collaterals from the left coronary.  Distal left main calcified with 30 to 40% narrowing  Basal inferior to mid inferior wall motion abnormality hypokinetic to akinetic.  EF 55%.  LVEDP 16 mmHg  RECOMMENDATIONS:   T CTS consultation for coronary bypass surgery which should be done soon.  Resume IV heparin  Continue IV nitroglycerin  Patient to notify of recurrent chest pain.  Patient Profile  [name] is a 79 year old female with medical history of OSA on CPAP, recent COVID-19 infection, hypertension, diabetes, obesity who was admitted 02/29/2020 for NSTEMI. Course c/b by acute confusion post cath 03/01/2020. MRI shows acute sub-centimeter R cerebellar infarct. CABG x 5 03/03/2020.   Assessment & Plan   1.  Non-STEMI s/p 5 v CABG -admit 3/29 for NSTEMI and found to have 3v CAD -LIMA-LAD, SVG-DIAG, SVG-PL, SVG-OM1, SVG-OM2 with Dr. Kipp Brood 03/03/2020 -doing well -ASA/statin -ICU management per CTS -tele stable and no Afib -continue BB as able   2. Confusion/Acute Subcentimeter R cerebellar Stroke  -became acutely confused post cath 3/30 at 12:30 PM. Head CT negative. CTA  without significant stenoses. MRI shows subcentimeter acute R cerebellar infarct.  -minimal carotid disease on [name] -confusion resolved and she went to CABG -transient related to cath  3.  Hypertension -BP stable. BB.   4.  Hyperlipidemia -High intensity statin  5.  Diabetes -A1c 7.1 -insulin drip in ICU  For questions or updates, please contact [place] Please consult [email or web address] for contact info under   Time Spent with Patient: I have spent a total of 25 minutes with patient reviewing hospital notes, telemetry, EKGs, labs and examining the patient as well as establishing an assessment and plan that was discussed with the patient.  > 50% of time was spent in direct patient care.    Signed, Addison Naegeli. Audie Box, [place]  03/04/2020 11:12 AM

## 2020-03-04 NOTE — Discharge Summary (Signed)
Physician Discharge Summary  Patient ID: Judy [name] MRN: [id number] DOB/AGE: 04/10/1941 4 y.o.  Admit date: 02/29/2020 Discharge date: 03/08/2020  Admission Diagnoses:  Patient Active Problem List   Diagnosis Date Noted  . Cerebral thrombosis with cerebral infarction 03/02/2020  . NSTEMI (non-ST elevated myocardial infarction) ([place]) 02/29/2020  . Hyperlipidemia associated with type 2 diabetes mellitus ([place]) 02/09/2020  . Type 2 diabetes mellitus, controlled ([place]) 01/12/2020  . Class 2 severe obesity with serious comorbidity and body mass index (BMI) of 35.0 to 35.9 in adult, unspecified obesity type ([place]) 12/25/2018  . Obstructive sleep apnea 11/04/2017  . Lumbar stenosis with neurogenic claudication 11/19/2016  . HTN (hypertension) 03/21/2016  . Anxiety   . Depression     Discharge Diagnoses:   Patient Active Problem List   Diagnosis Date Noted  . S/P CABG (coronary artery bypass graft) 03/03/2020  . Cerebral thrombosis with cerebral infarction 03/02/2020  . NSTEMI (non-ST elevated myocardial infarction) ([place]) 02/29/2020  . Hyperlipidemia associated with type 2 diabetes mellitus ([place]) 02/09/2020  . Type 2 diabetes mellitus, controlled ([place]) 01/12/2020  . Class 2 severe obesity with serious comorbidity and body mass index (BMI) of 35.0 to 35.9 in adult, unspecified obesity type ([place]) 12/25/2018  . Obstructive sleep apnea 11/04/2017  . Lumbar stenosis with neurogenic claudication 11/19/2016  . HTN (hypertension) 03/21/2016  . Anxiety   . Depression    Discharged Condition: good  History of Present Illness:  Judy [name] is a 79 yo female with DM and Hypertension.  She presented to the hospital with complaints of chest pain.  This was located centrally and radiated under right breast.  This was associated with nausea.  During her workup in the ED she was noted to have ST changes and elevated Troponin levels.  She was placed on a NTG drip with alleviation of her chest pain.   She was admitted for further care.  Hospital Course:   She was taken to the catheterization lab on 03/01/2020.  She was noted to have multivessel CAD with reduced EF 45%.  It was felt coronary bypass grafting would be indicated.  The patient developed confusion and slurred speech.  Code stroke was initiated and she was found to have a small cerebellar stroke.  The patient was evaluated by Dr. Kipp Brood who felt coronary bypass grafting would be indicated.  The risks and benefits of the procedure were explained to the patient and she was agreeable to proceed.  The patient was stable from her stroke.  It was felt she could proceed to surgery.  She was taken to the operating room and underwent CABG x 5 utilizing LIMA to LAD, SVG to Diagonal, SVG to PLVB, SVG to OM1 and SVG OM 2.  She also underwent endoscopic harvest of greater saphenous vein from right leg.  The patient tolerated the procedure without difficulty and was taken to the SICU in stable condition.  She was extubated the evening of surgery.  During her stay in the SICU the patient did not require inotropic support.  Her arterial line and swan ganz catheter were removed without difficulty.  She had expected post operative blood loss anemia.  Her hemoglobin dropped to 6.7 and she was transfused 1 unit of packed cells.  Her chest tube output decreased and her chest tubes were removed on 03/04/2020.  She has continued to make steady progress.  She has maintained sinus rhythm.  Oxygen has been weaned and she maintains good saturations on room air.  Incisions are  noted to be healing well without evidence of infection.  She is tolerating diet without difficulty although has been somewhat slow to progress.  Activity progression is also been somewhat slow but she is making improvements.  She does have a walker at home which she will continue to use to assist with balance.  Renal function is very stable with most recent BUN/creatinine 20/1.14.  Her anemia is very  stable at this point.  Most recent hemoglobin hematocrit dated 03/06/2020 are 9.1/27.2 respectively.  At the time of discharge the patient is felt to be quite stable.  Addendum:  Her BP is slowly increasing.  She was started on low dose Cozaar for additional control.  Significant Diagnostic Studies: angiography:    Severe multivessel coronary disease  Segmental proximal to mid 90% LAD stenosis.  Distal flow is TIMI grade II.  Right coronary supplies LAD via septal perforator collaterals.  90% mid to distal LAD before the second obtuse marginal.  100% circumflex after the second obtuse marginal.  Total occlusion of distal RCA.  Left ventricular branch and PDA fill by collaterals from the left coronary.  Distal left main calcified with 30 to 40% narrowing  Basal inferior to mid inferior wall motion abnormality hypokinetic to akinetic.  EF 55%.  LVEDP 16 mmHg  Treatments: surgery:   CABG X 5.  LIMA to LAD, R SVG to OM 2, OM, 3 first diagonal and PLV.  The OM 3 graft was jumped off the hood of the first diagonal graft Endoscopic greater saphenous vein harvest on the right and left Intra-operative Transesophageal Echocardiogram  Discharge Exam: Blood pressure 131/67, pulse 100, temperature 98.1 F (36.7 C), temperature source Oral, resp. rate 20, height 5' (1.524 m), weight 82.1 kg, SpO2 97 %.  General appearance: alert, cooperative and no distress Heart: regular rate and rhythm Lungs: clear to auscultation bilaterally Abdomen: soft, non-tender; bowel sounds normal; no masses,  no organomegaly Extremities: edema trace, ecchymosis Wound: clean and dry  Discharge medications:  Allergies as of 03/08/2020   No Known Allergies     Medication List    STOP taking these medications   diltiazem 240 MG 24 hr capsule Commonly known as: CARDIZEM CD   etodolac 400 MG tablet Commonly known as: LODINE     TAKE these medications   accu-chek safe-t pro lancets Test twice daily   aspirin  81 MG EC tablet Take 1 tablet (81 mg total) by mouth daily.   atorvastatin 20 MG tablet Commonly known as: LIPITOR Take 20 mg by mouth at bedtime.   clonazePAM 0.5 MG tablet Commonly known as: KLONOPIN Take 1 tablet (0.5 mg total) by mouth at bedtime.   clopidogrel 75 MG tablet Commonly known as: Plavix Take 1 tablet (75 mg total) by mouth daily.   furosemide 20 MG tablet Commonly known as: LASIX Take 1 tablet (20 mg total) by mouth daily as needed for edema.   glucose blood test strip Commonly known as: Accu-Chek Aviva Test twice daily   imipramine 50 MG tablet Commonly known as: TOFRANIL Take 1 tablet (50 mg total) by mouth at bedtime.   Jardiance 10 MG Tabs tablet Generic drug: empagliflozin Take 10 mg by mouth daily. What changed: when to take this   losartan 25 MG tablet Commonly known as: COZAAR Take 1 tablet (25 mg total) by mouth daily.   metFORMIN 1000 MG tablet Commonly known as: GLUCOPHAGE Take 1 tablet (1,000 mg total) by mouth 2 (two) times daily with a meal.  metoprolol tartrate 25 MG tablet Commonly known as: LOPRESSOR Take 0.5 tablets (12.5 mg total) by mouth 2 (two) times daily.   NON FORMULARY 1 each by Other route See admin instructions. CPAP machine nightly   nystatin cream Commonly known as: MYCOSTATIN Apply 1 application topically 2 (two) times daily as needed for dry skin.   traMADol 50 MG tablet Commonly known as: ULTRAM Take 1-2 tablets (50-100 mg total) by mouth every 6 (six) hours as needed for moderate pain.      Follow-up Information    Lajuana Matte, MD Follow up on 03/18/2020.   Specialty: Cardiothoracic Surgery Why: Appointment is at 10:45 Contact information: [place] [address] [phone]        Almyra Deforest, [place] Follow up on 03/24/2020.   Specialties: Cardiology, Radiology Why: Appointment is at 8:15 Contact information: [address] [place]  [id number] [phone]         The patient has been discharged on:   1.Beta Blocker:  Yes [ y ]                              No   [   ]                              If No, reason:  2.Ace Inhibitor/ARB: Yes [ y  ]                                     No  [    ]                                     If No, reason:   3.Statin:   Yes [  y ]                  No  [   ]                  If No, reason:  4.[place]a:  Yes  [ y  ]                  No   [   ]                  If No, reason:   Signed:   Ellwood Handler, PA-C 03/08/2020, 8:41 AM

## 2020-03-04 NOTE — Progress Notes (Signed)
T[facility] DAILY ICU PROGRESS NOTE                   [address]            [place] [address]          [phone]   1 Day Post-Op Procedure(s) (LRB): CORONARY ARTERY BYPASS GRAFTING (CABG), ON PUMP, TIMES FIVE, USING LEFT INTERNAL MAMMARY ARTERY AND ENDOSCOPICALLY HARVESTED BILATERAL GREATER SAPHENOUS VEINS -flow track only (N/A) TRANSESOPHAGEAL ECHOCARDIOGRAM (TEE) (N/A)  Total Length of Stay:  LOS: 3 days   Subjective:  Up in bed eating some breakfast.   The patient denies N/V.   Says she had some pain and took an ASA.  She is alert and oriented  Objective: Vital signs in last 24 hours: Temp:  [96.1 F (35.6 C)-99 F (37.2 C)] 99 F (37.2 C) (04/02 0700) Pulse Rate:  [66-94] 80 (04/02 0700) Cardiac Rhythm: Normal sinus rhythm (04/02 0400) Resp:  [0-38] 19 (04/02 0700) BP: (84-137)/(49-98) 101/54 (04/02 0700) SpO2:  [93 %-100 %] 97 % (04/02 0700) Arterial Line BP: (100-152)/(41-78) 128/49 (04/02 0700) FiO2 (%):  [40 %-50 %] 40 % (04/01 2015) Weight:  [87.5 kg] 87.5 kg (04/02 0500)  Filed Weights   03/01/20 0744 03/03/20 0222 03/04/20 0500  Weight: 81.5 kg 83 kg 87.5 kg    Weight change: 4.537 kg   Hemodynamic parameters for last 24 hours: CVP:  [0 mmHg-16 mmHg] 16 mmHg CO:  [3.1 L/min] 3.1 L/min  Intake/Output from previous day: 04/01 0701 - 04/02 0700 In: 6317.4 [I.V.:3926.1; Blood:253; IV Piggyback:2138.2] Out: 3010 [Urine:2180; Blood:460; Chest Tube:370]  Current Meds: Scheduled Meds: . acetaminophen  1,000 mg Oral Q6H   Or  . acetaminophen (TYLENOL) oral liquid 160 mg/5 mL  1,000 mg Per Tube Q6H  . aspirin EC  325 mg Oral Daily   Or  . aspirin  324 mg Per Tube Daily  . atorvastatin  80 mg Oral q1800  . bisacodyl  10 mg Oral Daily   Or  . bisacodyl  10 mg Rectal Daily  . Chlorhexidine Gluconate Cloth  6 each Topical Daily  . docusate sodium  200 mg Oral Daily  . mouth rinse  15 mL Mouth Rinse BID  . metoprolol tartrate  12.5 mg Oral  BID   Or  . metoprolol tartrate  12.5 mg Per Tube BID  . mupirocin ointment  1 application Nasal BID  . [START ON 03/05/2020] pantoprazole  40 mg Oral Daily  . sodium chloride flush  3 mL Intravenous Q12H   Continuous Infusions: . sodium chloride 10 mL/hr at 03/04/20 0700  . sodium chloride    . sodium chloride    . albumin human Stopped (03/04/20 0321)  . cefUROXime (ZINACEF)  IV Stopped (03/04/20 0011)  . dexmedetomidine (PRECEDEX) IV infusion Stopped (03/03/20 1443)  . DOBUTamine    . famotidine (PEPCID) IV Stopped (03/03/20 1636)  . insulin 0.3 mL/hr at 03/04/20 0700  . lactated ringers    . lactated ringers    . lactated ringers 20 mL/hr at 03/04/20 0700  . niCARDipine    . nitroGLYCERIN Stopped (03/03/20 1740)  . phenylephrine (NEO-SYNEPHRINE) Adult infusion     PRN Meds:.sodium chloride, albumin human, dextrose, lactated ringers, metoprolol tartrate, midazolam, morphine injection, ondansetron (ZOFRAN) IV, oxyCODONE, sodium chloride flush, traMADol  General appearance: alert, cooperative and no distress Heart: regular rate and rhythm Lungs: clear to auscultation bilaterally Abdomen: soft, non-tender; bowel sounds normal; no masses,  no organomegaly Extremities: edema trace, ecchymosis RLE Wound: EVH sites clean, some bloody oozing present  Lab Results: CBC: Recent Labs    03/03/20 2023 03/03/20 2038 03/03/20 2212 03/04/20 0422  WBC 15.0*  --   --  10.4  HGB 7.9*   < > 6.8* 6.7*  HCT 24.4*   < > 20.0* 20.9*  PLT 168  --   --  145*   < > = values in this interval not displayed.   BMET:  Recent Labs    03/03/20 2023 03/03/20 2038 03/03/20 2212 03/04/20 0422  NA 137   < > 138 135  K 5.2*   < > 4.8 4.4  CL 109  --   --  106  CO2 19*  --   --  20*  GLUCOSE 133*  --   --  119*  BUN 20  --   --  18  CREATININE 1.12*  --   --  1.15*  CALCIUM 8.0*  --   --  8.0*   < > = values in this interval not displayed.    CMET: Lab Results  Component Value Date   WBC  10.4 03/04/2020   HGB 6.7 (LL) 03/04/2020   HCT 20.9 (L) 03/04/2020   PLT 145 (L) 03/04/2020   GLUCOSE 119 (H) 03/04/2020   CHOL 89 03/01/2020   TRIG 101 03/01/2020   HDL 39 (L) 03/01/2020   LDLCALC 30 03/01/2020   ALT 23 02/29/2020   AST 20 02/29/2020   NA 135 03/04/2020   K 4.4 03/04/2020   CL 106 03/04/2020   CREATININE 1.15 (H) 03/04/2020   BUN 18 03/04/2020   CO2 20 (L) 03/04/2020   TSH 1.950 01/21/2018   INR 1.7 (H) 03/03/2020   HGBA1C 7.1 (H) 02/09/2020      PT/INR:  Recent Labs    03/03/20 1420  LABPROT 19.7*  INR 1.7*   Radiology: [facility] Chest Port 1 View  Result Date: 03/04/2020 CLINICAL DATA:  Prior CABG.  Chest tube. EXAM: PORTABLE CHEST 1 VIEW COMPARISON:  03/03/2020. FINDINGS: Interim extubation and removal of NG tube. Right IJ line, mediastinal drainage catheter, and left chest tube in stable position. No pneumothorax. Prior CABG. Stable cardiomegaly. Low lung volumes with bibasilar atelectasis and infiltrates/edema noted on today's exam. Small left pleural effusion cannot be excluded. IMPRESSION: 1. Interim extubation and removal of NG tube. Remaining lines and tubes including left chest tube in stable position. No pneumothorax. 2.  Prior CABG.  Stable cardiomegaly. 3. Low lung volumes bibasilar atelectasis and infiltrates/edema noted on today's exam. Small left pleural effusion cannot be excluded. Electronically Signed   By: Marcello Moores  Register   On: 03/04/2020 07:21   DG Chest Port 1 View  Result Date: 03/03/2020 CLINICAL DATA:  Hypoxia EXAM: PORTABLE CHEST 1 VIEW COMPARISON:  February 29, 2020 FINDINGS: Endotracheal tube tip is 2.3 cm above the carina. Nasogastric tube tip and side port above the diaphragm. Central catheter tip is in the superior vena cava. There is a left chest tube and an apparent mediastinal drain. No pneumothorax. There is an ill-defined area of opacity in the left lower lobe adjacent to the left heart border. Lungs elsewhere clear. Heart is upper  normal in size with pulmonary vascularity normal. Status post coronary artery bypass grafting. No adenopathy. No bone lesions. IMPRESSION: Tube and catheter positions as described without pneumothorax. Postoperative changes. Focal consolidation left lower lobe which may represent localized atelectasis or possibly early developing pneumonia. Lungs elsewhere clear.  Heart upper normal in size. Electronically Signed   By: Lowella Grip III M.D.   On: 03/03/2020 14:35   ECHO INTRAOPERATIVE TEE  Result Date: 03/03/2020  *INTRAOPERATIVE TRANSESOPHAGEAL REPORT *  Patient Name:   Judy [name] [facility] Date of Exam: 03/03/2020 Medical Rec #:  [id number]      Height:       60.0 in Accession #:    [id number]     Weight:       182.9 lb Date of Birth:  1941/08/12      BSA:          1.80 m Patient Age:    79 years       BP:           117/72 mmHg Patient Gender: F              HR:           60 bpm. Exam Location:  Inpatient Transesophogeal exam was perform intraoperatively during surgical procedure. Patient was closely monitored under general anesthesia during the entirety of examination. Indications:     coronary artery bypass Performing Phys: [id number] Lucile Crater LIGHTFOOT Diagnosing Phys: Suzette Battiest MD Complications: No known complications during this procedure. POST-OP IMPRESSIONS - Left Ventricle: The left ventricle is unchanged from pre-bypass. - Right Ventricle: The right ventricle appears unchanged from pre-bypass. - Aorta: The aorta appears unchanged from pre-bypass. - Left Atrium: The left atrium appears unchanged from pre-bypass. - Left Atrial Appendage: The left atrial appendage appears unchanged from pre-bypass. - Aortic Valve: The aortic valve appears unchanged from pre-bypass. - Mitral Valve: The mitral valve appears unchanged from pre-bypass. - Tricuspid Valve: The tricuspid valve appears unchanged from pre-bypass. - Interatrial Septum: The interatrial septum appears unchanged from pre-bypass. - Interventricular  Septum: The interventricular septum appears unchanged from pre-bypass. - Pericardium: The pericardium appears unchanged from pre-bypass. PRE-OP FINDINGS  Left Ventricle: The left ventricle has low normal systolic function, with an ejection fraction of 50-55%. The cavity size was normal. There is mildly increased left ventricular wall thickness. Right Ventricle: The right ventricle has normal systolic function. The cavity was normal. There is no increase in right ventricular wall thickness. Left Atrium: Left atrial size was normal in size.  Interatrial Septum: No atrial level shunt detected by color flow Doppler. Pericardium: Trivial pericardial effusion is present. Mitral Valve: The mitral valve is normal in structure. Mild thickening of the mitral valve leaflet. Mitral valve regurgitation is mild to moderate by color flow Doppler. The MR jet is centrally-directed. Tricuspid Valve: The tricuspid valve was normal in structure. Tricuspid valve regurgitation is trivial by color flow Doppler. Aortic Valve: The aortic valve is tricuspid Aortic valve regurgitation was not visualized by color flow Doppler. There is no stenosis of the aortic valve. Pulmonic Valve: The pulmonic valve was normal in structure. Pulmonic valve regurgitation is trivial by color flow Doppler. Aorta: The aortic root, ascending aorta and aortic arch are normal in size and structure. There is evidence of plaque in the descending aorta. +--------------+--------++ LEFT VENTRICLE         +--------------+--------++ PLAX 2D                +--------------+--------++ LVOT diam:    2.10 cm  +--------------+--------++ LVOT Area:    3.46 cm +--------------+--------++                        +--------------+--------++  +--------------+-------+ SHUNTS                +--------------+-------+  Systemic Diam:2.10 cm +--------------+-------+  Suzette Battiest MD Electronically signed by Suzette Battiest MD Signature Date/Time:  03/03/2020/1:46:03 PM    Final      Assessment/Plan: S/P Procedure(s) (LRB): CORONARY ARTERY BYPASS GRAFTING (CABG), ON PUMP, TIMES FIVE, USING LEFT INTERNAL MAMMARY ARTERY AND ENDOSCOPICALLY HARVESTED BILATERAL GREATER SAPHENOUS VEINS -flow track only (N/A) TRANSESOPHAGEAL ECHOCARDIOGRAM (TEE) (N/A)  1. CV- NSR, BP labile- not requiring any drips, start low dose BB... EKG reviewed shows NSR and no acute changes 2. Pulm- CT output 370 cc yesterday, no pneumothorax, effusion present, some atelectasis.. can likely place CTs to bulb suction 3. Renal- creatinine stable, lytes okay... hold off on diuretics today, can start tomorrow if indicated 4. Expected post operative blood loss anemia, Hgb at 6.7, will transfuse 1 unit of packed cells 5. DM- sugars well controlled on insulin drip, will start Levemir and SSIP 6. Dispo- patient in NSR, AAO x 3, doesn't appear to have any deficits from preoperative stroke.. will transfuse 1 unit of packed cells for anemia, POD #1 progression orders     Ellwood Handler, PA-C 03/04/2020 7:28 AM

## 2020-03-04 NOTE — Progress Notes (Signed)
      [address]       [place] [address]             [phone]       Up in chair, comfortable  BP 111/65 (BP Location: Right Arm)   Pulse 79   Temp 98.4 F (36.9 C) (Axillary)   Resp (!) 21   Ht 5' (1.524 m)   Wt 87.5 kg   SpO2 95%   BMI 37.67 kg/m  RA 95% sat  Intake/Output Summary (Last 24 hours) at 03/04/2020 1949 Last data filed at 03/04/2020 1900 Gross per 24 hour  Intake 3170.68 ml  Output 1190 ml  Net 1980.68 ml   K= 4.1, creatinine 1.3 Hct= 26  Doing well POD #1  Remo Lipps C. Roxan Hockey, MD [facility] [phone]

## 2020-03-04 NOTE — Progress Notes (Signed)
STROKE TEAM PROGRESS NOTE   INTERVAL HISTORY Patient is sitting up in bed and looks great. AO x 3 . No complaints.  Vital signs are stable. Vitals:   03/04/20 1100 03/04/20 1200 03/04/20 1223 03/04/20 1300  BP: (!) 119/94 (!) 111/55  107/69  Pulse: 78 79  87  Resp: (!) 21 18  (!) 21  Temp:   99.1 F (37.3 C)   TempSrc:   Oral   SpO2: 94% 93%  95%  Weight:      Height:        CBC:  Recent Labs  Lab 02/29/20 1037 03/01/20 0351 03/03/20 2023 03/03/20 2038 03/03/20 2212 03/04/20 0422  WBC 9.4   < > 15.0*  --   --  10.4  NEUTROABS 7.8*  --   --   --   --   --   HGB 14.0   < > 7.9*   < > 6.8* 6.7*  HCT 43.9   < > 24.4*   < > 20.0* 20.9*  MCV 93.8   < > 93.1  --   --  94.1  PLT 285   < > 168  --   --  145*   < > = values in this interval not displayed.    Basic Metabolic Panel:  Recent Labs  Lab 03/03/20 2023 03/03/20 2038 03/03/20 2212 03/04/20 0422  NA 137   < > 138 135  K 5.2*   < > 4.8 4.4  CL 109  --   --  106  CO2 19*  --   --  20*  GLUCOSE 133*  --   --  119*  BUN 20  --   --  18  CREATININE 1.12*  --   --  1.15*  CALCIUM 8.0*  --   --  8.0*  MG 2.8*  --   --  2.5*   < > = values in this interval not displayed.   Lipid Panel:     Component Value Date/Time   CHOL 89 03/01/2020 0351   CHOL 184 06/19/2018 0958   TRIG 101 03/01/2020 0351   HDL 39 (L) 03/01/2020 0351   HDL 45 06/19/2018 0958   CHOLHDL 2.3 03/01/2020 0351   VLDL 20 03/01/2020 0351   LDLCALC 30 03/01/2020 0351   LDLCALC 107 (H) 06/19/2018 0958   HgbA1c:  Lab Results  Component Value Date   HGBA1C 7.1 (H) 02/09/2020   Urine Drug Screen: No results found for: LABOPIA, COCAINSCRNUR, LABBENZ, AMPHETMU, THCU, LABBARB  Alcohol Level No results found for: ETH  IMAGING past 24 hours DG Chest Port 1 View  Result Date: 03/04/2020 CLINICAL DATA:  Prior CABG.  Chest tube. EXAM: PORTABLE CHEST 1 VIEW COMPARISON:  03/03/2020. FINDINGS: Interim extubation and removal of NG tube. Right IJ line,  mediastinal drainage catheter, and left chest tube in stable position. No pneumothorax. Prior CABG. Stable cardiomegaly. Low lung volumes with bibasilar atelectasis and infiltrates/edema noted on today's exam. Small left pleural effusion cannot be excluded. IMPRESSION: 1. Interim extubation and removal of NG tube. Remaining lines and tubes including left chest tube in stable position. No pneumothorax. 2.  Prior CABG.  Stable cardiomegaly. 3. Low lung volumes bibasilar atelectasis and infiltrates/edema noted on today's exam. Small left pleural effusion cannot be excluded. Electronically Signed   By: Marcello Moores  Register   On: 03/04/2020 07:21    PHYSICAL EXAM pleasant obese elderly Caucasian who is  not in distress. Neurological Exam ;  Patient is  awake alert interactive. Oriented x 3. No aphasia or dysarthria  Eyes in primary position.  Pupils equal reactive.  Doll's eye movement sluggish.  Does not blink to threat bilaterally..fundi were not visualized. . Face symmetric. Tongue midline.  Does withdraw all 4 extremities slightly to noxious stimuli.   ASSESSMENT/PLAN Ms. Judy [name] is a 79 y.o. female with history of sleep apnea, leg edema, hypertension, diabetes admitted for CP, elevated cardiac enzymes. S/p cardiac cath developed word finding difficulties, altered mentation.   Coronary artery disease NSTEMI  On IV heparin  Plans for  CABG  Need neuro clearance for heparin during procedure    Stroke:   Incidental tiny R cerebellar infarct post cardiac cath, not related to symptoms, embolic d/t cath  CT head No acute abnormality. Stable Small vessel disease and Atrophy.   CTA head & neck no ELVO. L P2 mild narrowing. B ICA bifurcation w/ minimal atherosclerosis. B VA atherosclerosis. B cavernous ICA atherosclerosis. Multilevel spondylosis of CSpine.   MRI  Subcentimeter R cerebellar infarct. Moderate small vessel disease and atrophy  2D Echo EF 45-50%. No source of embolus   LDL  30  HgbA1c 7.1  IV heparin for VTE prophylaxis  No antithrombotic prior to admission, now on aspirin 81 mg daily and heparin IV.   Therapy recommendations:  Recommend PT and OT evals post CABG  Disposition:  pending   Hypertension  Stable on the low side . Permissive hypertension (OK if < 220/120) but gradually normalize in 5-7 days . Long-term BP goal normotensive  Hyperlipidemia  Home meds:  liptior 20   Now on lipitor 80  LDL 30, goal < 70  Continue statin at discharge  Diabetes type II Uncontrolled  HgbA1c 7.1, goal < 7.0  Other Stroke Risk Factors  Advanced age  Obesity, Body mass index is 37.67 kg/m., recommend weight loss, diet and exercise as appropriate   Obstructive sleep apnea  Hx COVD PNA 12/03/2019  Hospital day # 3 Continue perioperative post CABG care as per CVTS team.   .  Continue ongoing treatment.TStroke team will sign off. D/w Dr Jenetta Downer `Neal.     Antony Contras, MD To contact Stroke Continuity provider, please refer to [email or web address] After hours, contact General Neurology

## 2020-03-05 ENCOUNTER — Inpatient Hospital Stay (HOSPITAL_COMMUNITY): Payer: Medicare Other

## 2020-03-05 DIAGNOSIS — E1159 Type 2 diabetes mellitus with other circulatory complications: Secondary | ICD-10-CM

## 2020-03-05 DIAGNOSIS — E785 Hyperlipidemia, unspecified: Secondary | ICD-10-CM

## 2020-03-05 DIAGNOSIS — I6330 Cerebral infarction due to thrombosis of unspecified cerebral artery: Secondary | ICD-10-CM

## 2020-03-05 DIAGNOSIS — I10 Essential (primary) hypertension: Secondary | ICD-10-CM

## 2020-03-05 DIAGNOSIS — E119 Type 2 diabetes mellitus without complications: Secondary | ICD-10-CM

## 2020-03-05 DIAGNOSIS — E1169 Type 2 diabetes mellitus with other specified complication: Secondary | ICD-10-CM

## 2020-03-05 DIAGNOSIS — Z794 Long term (current) use of insulin: Secondary | ICD-10-CM

## 2020-03-05 DIAGNOSIS — Z951 Presence of aortocoronary bypass graft: Secondary | ICD-10-CM

## 2020-03-05 LAB — CBC
HCT: 26.3 % — ABNORMAL LOW (ref 36.0–46.0)
Hemoglobin: 8.5 g/dL — ABNORMAL LOW (ref 12.0–15.0)
MCH: 30 pg (ref 26.0–34.0)
MCHC: 32.3 g/dL (ref 30.0–36.0)
MCV: 92.9 fL (ref 80.0–100.0)
Platelets: 177 10*3/uL (ref 150–400)
RBC: 2.83 MIL/uL — ABNORMAL LOW (ref 3.87–5.11)
RDW: 13.7 % (ref 11.5–15.5)
WBC: 12.2 10*3/uL — ABNORMAL HIGH (ref 4.0–10.5)
nRBC: 0 % (ref 0.0–0.2)

## 2020-03-05 LAB — BASIC METABOLIC PANEL
Anion gap: 8 (ref 5–15)
BUN: 20 mg/dL (ref 8–23)
CO2: 24 mmol/L (ref 22–32)
Calcium: 8.4 mg/dL — ABNORMAL LOW (ref 8.9–10.3)
Chloride: 104 mmol/L (ref 98–111)
Creatinine, Ser: 1.11 mg/dL — ABNORMAL HIGH (ref 0.44–1.00)
GFR calc Af Amer: 55 mL/min — ABNORMAL LOW (ref >60)
GFR calc non Af Amer: 48 mL/min — ABNORMAL LOW (ref >60)
Glucose, Bld: 115 mg/dL — ABNORMAL HIGH (ref 70–99)
Potassium: 3.8 mmol/L (ref 3.5–5.1)
Sodium: 136 mmol/L (ref 135–145)

## 2020-03-05 LAB — GLUCOSE, CAPILLARY
Glucose-Capillary: 104 mg/dL — ABNORMAL HIGH (ref 70–99)
Glucose-Capillary: 113 mg/dL — ABNORMAL HIGH (ref 70–99)
Glucose-Capillary: 119 mg/dL — ABNORMAL HIGH (ref 70–99)

## 2020-03-05 MED ORDER — METFORMIN HCL 500 MG PO TABS
1000.0000 mg | ORAL_TABLET | Freq: Two times a day (BID) | ORAL | Status: DC
  Administered 2020-03-05 – 2020-03-08 (×7): 1000 mg via ORAL
  Filled 2020-03-05 (×7): qty 2

## 2020-03-05 MED ORDER — ACETAMINOPHEN 500 MG PO TABS
1000.0000 mg | ORAL_TABLET | Freq: Once | ORAL | Status: DC
  Filled 2020-03-05: qty 2

## 2020-03-05 MED ORDER — CANAGLIFLOZIN 100 MG PO TABS: 100.0000 mg | ORAL_TABLET | Freq: Every day | ORAL | Status: DC

## 2020-03-05 MED ORDER — ~~LOC~~ CARDIAC SURGERY, PATIENT & FAMILY EDUCATION: Freq: Once | Status: DC

## 2020-03-05 MED ORDER — SODIUM CHLORIDE 0.9% FLUSH
3.0000 mL | Freq: Two times a day (BID) | INTRAVENOUS | Status: DC
  Administered 2020-03-05 – 2020-03-07 (×6): 3 mL via INTRAVENOUS

## 2020-03-05 MED ORDER — POTASSIUM CHLORIDE CRYS ER 20 MEQ PO TBCR
20.0000 meq | EXTENDED_RELEASE_TABLET | Freq: Two times a day (BID) | ORAL | Status: DC
  Administered 2020-03-05 – 2020-03-08 (×7): 20 meq via ORAL
  Filled 2020-03-05 (×7): qty 1

## 2020-03-05 MED ORDER — IMIPRAMINE HCL 25 MG PO TABS
50.0000 mg | ORAL_TABLET | Freq: Every day | ORAL | Status: DC
  Administered 2020-03-05 – 2020-03-07 (×3): 50 mg via ORAL
  Filled 2020-03-05 (×4): qty 2

## 2020-03-05 MED ORDER — SODIUM CHLORIDE 0.9 % IV SOLN: 250.0000 mL | INTRAVENOUS | Status: DC | PRN

## 2020-03-05 MED ORDER — FUROSEMIDE 40 MG PO TABS
40.0000 mg | ORAL_TABLET | Freq: Every day | ORAL | Status: DC
  Administered 2020-03-05 – 2020-03-08 (×4): 40 mg via ORAL
  Filled 2020-03-05 (×4): qty 1

## 2020-03-05 MED ORDER — SODIUM CHLORIDE 0.9% FLUSH: 3.0000 mL | INTRAVENOUS | Status: DC | PRN

## 2020-03-05 MED ORDER — MAGNESIUM HYDROXIDE 400 MG/5ML PO SUSP: 30.0000 mL | Freq: Every day | ORAL | Status: DC | PRN

## 2020-03-05 NOTE — Progress Notes (Signed)
Received Judy [name] from 2 H, S/P CABG x 5 via W/C.  Pt is AAOx4. Vital signs taken and WNL.  Placed on cardiac monitor and call placed to CCMD.  CHG bath completed.  Orientation to room for patient and children.  Pt was able to ambulate to bathroom and to bed with minimal assist.

## 2020-03-05 NOTE — Progress Notes (Signed)
CARDIAC REHAB PHASE I   PRE:  Rate/Rhythm: 79 SR  BP:  Supine: 122/62  Sitting:   Standing:    SaO2: 91 RA  MODE:  Ambulation: 140 ft   POST:  Rate/Rhythm: 98 SR  BP:  Supine:   Sitting: to bathroom  Standing:    SaO2: 94RA   1430-1500  Assisted X 2 and used walker to ambulate. Gait slow but steady with walker. VS stable. Pt walked 140 feet. She c/o of being tired and busy today. Pt to bathroom after walk then back to bed per her request. Encouraged use of IS hourly.  Rodney Langton RN 03/05/2020 2:54 PM

## 2020-03-05 NOTE — Progress Notes (Signed)
Progress Note  Patient Name: Judy [name] Date of Encounter: 03/05/2020  Primary Cardiologist: No primary care provider on file. New ([place])  Subjective   Some chest discomfort when getting back in bed, but otherwise well controlled postop pain.  Transient nausea has resolved. Denies any neurological complaints, stroke symptoms appear to have resolved completely.  Maintaining sinus rhythm.  Inpatient Medications    Scheduled Meds: . acetaminophen  1,000 mg Oral Q6H   Or  . acetaminophen (TYLENOL) oral liquid 160 mg/5 mL  1,000 mg Per Tube Q6H  . acetaminophen  1,000 mg Oral Once  . aspirin EC  325 mg Oral Daily   Or  . aspirin  324 mg Per Tube Daily  . atorvastatin  80 mg Oral q1800  . bisacodyl  10 mg Oral Daily   Or  . bisacodyl  10 mg Rectal Daily  . Chlorhexidine Gluconate Cloth  6 each Topical Daily  . [facility], Patient & Family Education   Does not apply Once  . docusate sodium  200 mg Oral Daily  . enoxaparin (LOVENOX) injection  30 mg Subcutaneous QHS  . furosemide  40 mg Oral Daily  . imipramine  50 mg Oral QHS  . mouth rinse  15 mL Mouth Rinse BID  . metFORMIN  1,000 mg Oral BID WC  . metoprolol tartrate  12.5 mg Oral BID   Or  . metoprolol tartrate  12.5 mg Per Tube BID  . mupirocin ointment  1 application Nasal BID  . pantoprazole  40 mg Oral Daily  . potassium chloride  20 mEq Oral BID  . sodium chloride flush  3 mL Intravenous Q12H   Continuous Infusions: . sodium chloride     PRN Meds: sodium chloride, magnesium hydroxide, metoprolol tartrate, ondansetron (ZOFRAN) IV, oxyCODONE, sodium chloride flush, traMADol   Vital Signs    Vitals:   03/05/20 0916 03/05/20 1000 03/05/20 1100 03/05/20 1116  BP: (!) 119/53 (!) 125/59 120/67   Pulse: 81 77 73   Resp:  11 18   Temp:    98.4 F (36.9 C)  TempSrc:      SpO2:  94% 97%   Weight:      Height:        Intake/Output Summary (Last 24 hours) at 03/05/2020 1120 Last data filed  at 03/05/2020 1100 Gross per 24 hour  Intake 895.3 ml  Output 626 ml  Net 269.3 ml   Last 3 Weights 03/05/2020 03/04/2020 03/03/2020  Weight (lbs) 194 lb 14.2 oz 192 lb 14.4 oz 182 lb 14.4 oz  Weight (kg) 88.4 kg 87.5 kg 82.963 kg      Telemetry    Sinus rhythm- Personally Reviewed  ECG    Normal sinus rhythm, generalized low voltage, relatively sharp inferior Q waves, otherwise no ischemic changes - Personally Reviewed  Physical Exam  Obese, well-healing surgical scar and invisible line locations GEN: No acute distress.   Neck: No JVD Cardiac: RRR, no murmurs, rubs, or gallops.  Respiratory: Clear to auscultation bilaterally. GI: Soft, nontender, non-distended  MS: No edema; No deformity. Neuro:  Nonfocal  Psych: Normal affect   Labs    High Sensitivity Troponin:   Recent Labs  Lab 02/29/20 1037 02/29/20 1309 02/29/20 1732 03/01/20 0832 03/01/20 1010  TROPONINIHS 61* 302* 2,127* 13,775* 12,678*      Chemistry Recent Labs  Lab 02/29/20 1037 03/01/20 0351 03/04/20 0422 03/04/20 1452 03/05/20 0433  NA 139   < > 135  134* 136  K 4.3   < > 4.4 4.1 3.8  CL 100   < > 106 108 104  CO2 26   < > 20* 23 24  GLUCOSE 144*   < > 119* 171* 115*  BUN 18   < > 18 19 20   CREATININE 1.18*   < > 1.15* 1.30* 1.11*  CALCIUM 10.0   < > 8.0* 7.9* 8.4*  PROT 7.1  --   --   --   --   ALBUMIN 4.1  --   --   --   --   AST 20  --   --   --   --   ALT 23  --   --   --   --   ALKPHOS 75  --   --   --   --   BILITOT 0.5  --   --   --   --   GFRNONAA 44*   < > 46* 40* 48*  GFRAA 52*   < > 53* 46* 55*  ANIONGAP 13   < > 9 3* 8   < > = values in this interval not displayed.     Hematology Recent Labs  Lab 03/04/20 0422 03/04/20 1452 03/05/20 0433  WBC 10.4 11.8* 12.2*  RBC 2.22* 2.82* 2.83*  HGB 6.7* 8.7* 8.5*  HCT 20.9* 26.2* 26.3*  MCV 94.1 92.9 92.9  MCH 30.2 30.9 30.0  MCHC 32.1 33.2 32.3  RDW 13.3 13.3 13.7  PLT 145* 160 177    BNPNo results for input(s): BNP, PROBNP  in the last 168 hours.   DDimer  Recent Labs  Lab 02/29/20 1037  DDIMER 0.57*     Radiology    DG Chest Port 1 View  Result Date: 03/05/2020 CLINICAL DATA:  CABG EXAM: PORTABLE CHEST 1 VIEW COMPARISON:  Yesterday FINDINGS: Right IJ line with tip near the upper SVC. Chest tubes remain. Cardiomegaly after CABG that is stable. Mildly low lung volumes with atelectasis. No visible pneumothorax. IMPRESSION: Stable postoperative chest.  No visible pneumothorax. Electronically Signed   By: Monte Fantasia M.D.   On: 03/05/2020 07:13   DG Chest Port 1 View  Result Date: 03/04/2020 CLINICAL DATA:  Prior CABG.  Chest tube. EXAM: PORTABLE CHEST 1 VIEW COMPARISON:  03/03/2020. FINDINGS: Interim extubation and removal of NG tube. Right IJ line, mediastinal drainage catheter, and left chest tube in stable position. No pneumothorax. Prior CABG. Stable cardiomegaly. Low lung volumes with bibasilar atelectasis and infiltrates/edema noted on today's exam. Small left pleural effusion cannot be excluded. IMPRESSION: 1. Interim extubation and removal of NG tube. Remaining lines and tubes including left chest tube in stable position. No pneumothorax. 2.  Prior CABG.  Stable cardiomegaly. 3. Low lung volumes bibasilar atelectasis and infiltrates/edema noted on today's exam. Small left pleural effusion cannot be excluded. Electronically Signed   By: Marcello Moores  Register   On: 03/04/2020 07:21   DG Chest Port 1 View  Result Date: 03/03/2020 CLINICAL DATA:  Hypoxia EXAM: PORTABLE CHEST 1 VIEW COMPARISON:  February 29, 2020 FINDINGS: Endotracheal tube tip is 2.3 cm above the carina. Nasogastric tube tip and side port above the diaphragm. Central catheter tip is in the superior vena cava. There is a left chest tube and an apparent mediastinal drain. No pneumothorax. There is an ill-defined area of opacity in the left lower lobe adjacent to the left heart border. Lungs elsewhere clear. Heart is upper normal in size with pulmonary  vascularity normal. Status post coronary artery bypass grafting. No adenopathy. No bone lesions. IMPRESSION: Tube and catheter positions as described without pneumothorax. Postoperative changes. Focal consolidation left lower lobe which may represent localized atelectasis or possibly early developing pneumonia. Lungs elsewhere clear. Heart upper normal in size. Electronically Signed   By: Lowella Grip III M.D.   On: 03/03/2020 14:35    Cardiac Studies    TTE 02/29/2020 1. Left ventricular ejection fraction, by estimation, is 45 to 50%. The  left ventricle has mildly decreased function. The left ventricle  demonstrates regional wall motion abnormalities (see scoring  diagram/findings for description). Inferior  hypokinesis. There is mild left ventricular hypertrophy. Left ventricular  diastolic parameters are indeterminate.  2. Right ventricular systolic function is normal. The right ventricular  size is normal.  3. The mitral valve is normal in structure. Trivial mitral valve  regurgitation.  4. The aortic valve was not well visualized. Aortic valve regurgitation  is not visualized. No aortic stenosis is present.   Cardiac catheterization 03/01/2020  Severe multivessel coronary disease  Segmental proximal to mid 90% LAD stenosis.  Distal flow is TIMI grade II.  Right coronary supplies LAD via septal perforator collaterals.  90% mid to distal LAD before the second obtuse marginal.  100% circumflex after the second obtuse marginal.  Total occlusion of distal RCA.  Left ventricular branch and PDA fill by collaterals from the left coronary.  Distal left main calcified with 30 to 40% narrowing  Basal inferior to mid inferior wall motion abnormality hypokinetic to akinetic.  EF 55%.  LVEDP 16 mmHg  Patient Profile     79 y.o. female with obesity, OSA, hypertension, type 2 diabetes mellitus, recent COVID-19 infection, admitted 02/29/2020 for non-STEMI and found to have severe  multivessel CAD, 2 days status post CABG x5, transient neurological complaints with MRI showing acute subcentimeter right cerebellar infarct  Assessment & Plan    1.  CAD status post non-STEMI status post CABG x5 on 03/03/2020: Making very good progress.  On aspirin and statin and beta-blocker.  Recommend adding clopidogrel at discharge due to acute coronary event. 2. CVA: Small right cerebral infarction, symptoms have already resolved. 3. HTN: Controlled, limits the beta-blocker dose  4. DM: Recent hemoglobin A1c 7.1%     For questions or updates, please contact [place] Please consult [email or web address] for contact info under        Signed, Sanda Klein, MD  03/05/2020, 11:20 AM

## 2020-03-05 NOTE — Progress Notes (Signed)
2 Days Post-Op Procedure(s) (LRB): CORONARY ARTERY BYPASS GRAFTING (CABG), ON PUMP, TIMES FIVE, USING LEFT INTERNAL MAMMARY ARTERY AND ENDOSCOPICALLY HARVESTED BILATERAL GREATER SAPHENOUS VEINS -flow track only (N/A) TRANSESOPHAGEAL ECHOCARDIOGRAM (TEE) (N/A) Subjective: Transient nausea earlier, now resolved, denies pain  Objective: Vital signs in last 24 hours: Temp:  [97.9 F (36.6 C)-99.1 F (37.3 C)] 98.1 F (36.7 C) (04/03 0721) Pulse Rate:  [45-87] 67 (04/03 0600) Cardiac Rhythm: Normal sinus rhythm (04/03 0350) Resp:  [8-27] 8 (04/03 0600) BP: (82-128)/(55-94) 113/64 (04/03 0600) SpO2:  [74 %-100 %] 98 % (04/03 0600) Weight:  [88.4 kg] 88.4 kg (04/03 0500)  Hemodynamic parameters for last 24 hours:    Intake/Output from previous day: 04/02 0701 - 04/03 0700 In: 1661.6 [P.O.:960; I.V.:267.7; Blood:224; IV Piggyback:209.9] Out: 601 [Urine:306; Chest Tube:295] Intake/Output this shift: No intake/output data recorded.  General appearance: alert, cooperative and no distress Neurologic: intact Heart: regular rate and rhythm Lungs: diminished breath sounds bibasilar Abdomen: normal findings: soft, non-tender  Lab Results: Recent Labs    03/04/20 1452 03/05/20 0433  WBC 11.8* 12.2*  HGB 8.7* 8.5*  HCT 26.2* 26.3*  PLT 160 177   BMET:  Recent Labs    03/04/20 1452 03/05/20 0433  NA 134* 136  K 4.1 3.8  CL 108 104  CO2 23 24  GLUCOSE 171* 115*  BUN 19 20  CREATININE 1.30* 1.11*  CALCIUM 7.9* 8.4*    PT/INR:  Recent Labs    03/03/20 1420  LABPROT 19.7*  INR 1.7*   ABG    Component Value Date/Time   PHART 7.305 (L) 03/03/2020 2212   HCO3 19.4 (L) 03/03/2020 2212   TCO2 21 (L) 03/03/2020 2212   ACIDBASEDEF 6.0 (H) 03/03/2020 2212   O2SAT 93.0 03/03/2020 2212   CBG (last 3)  Recent Labs    03/04/20 2340 03/05/20 0349 03/05/20 0718  GLUCAP 130* 104* 113*    Assessment/Plan: S/P Procedure(s) (LRB): CORONARY ARTERY BYPASS GRAFTING (CABG),  ON PUMP, TIMES FIVE, USING LEFT INTERNAL MAMMARY ARTERY AND ENDOSCOPICALLY HARVESTED BILATERAL GREATER SAPHENOUS VEINS -flow track only (N/A) TRANSESOPHAGEAL ECHOCARDIOGRAM (TEE) (N/A) Plan for transfer to step-down: see transfer orders  POD # 2 CABG CV- stable in SR.  Continue ASA, lipitor, lopressor RESP- continue IS RENAL- creatinine improved, supplement K ENDO- CBG well controlled with levemir + SSI Anemia secondary to ABL- stable, follow Enoxaparin + SCD for DVT prophylaxis Transfer to 4e Continue cardiac rehab  LOS: 4 days    Judy [name] 03/05/2020

## 2020-03-06 ENCOUNTER — Inpatient Hospital Stay (HOSPITAL_COMMUNITY): Payer: Medicare Other

## 2020-03-06 LAB — TYPE AND SCREEN
ABO/RH(D): O POS
Antibody Screen: NEGATIVE
Unit division: 0
Unit division: 0

## 2020-03-06 LAB — BASIC METABOLIC PANEL
Anion gap: 9 (ref 5–15)
BUN: 20 mg/dL (ref 8–23)
CO2: 24 mmol/L (ref 22–32)
Calcium: 8.5 mg/dL — ABNORMAL LOW (ref 8.9–10.3)
Chloride: 105 mmol/L (ref 98–111)
Creatinine, Ser: 1.14 mg/dL — ABNORMAL HIGH (ref 0.44–1.00)
GFR calc Af Amer: 54 mL/min — ABNORMAL LOW (ref >60)
GFR calc non Af Amer: 46 mL/min — ABNORMAL LOW (ref >60)
Glucose, Bld: 96 mg/dL (ref 70–99)
Potassium: 3.7 mmol/L (ref 3.5–5.1)
Sodium: 138 mmol/L (ref 135–145)

## 2020-03-06 LAB — CBC
HCT: 27.2 % — ABNORMAL LOW (ref 36.0–46.0)
Hemoglobin: 9.1 g/dL — ABNORMAL LOW (ref 12.0–15.0)
MCH: 30.4 pg (ref 26.0–34.0)
MCHC: 33.5 g/dL (ref 30.0–36.0)
MCV: 91 fL (ref 80.0–100.0)
Platelets: 221 10*3/uL (ref 150–400)
RBC: 2.99 MIL/uL — ABNORMAL LOW (ref 3.87–5.11)
RDW: 13.6 % (ref 11.5–15.5)
WBC: 11.5 10*3/uL — ABNORMAL HIGH (ref 4.0–10.5)
nRBC: 0 % (ref 0.0–0.2)

## 2020-03-06 LAB — BPAM RBC
Blood Product Expiration Date: 202104272359
Blood Product Expiration Date: 202105042359
ISSUE DATE / TIME: 202104011149
ISSUE DATE / TIME: 202104020825
Unit Type and Rh: 5100
Unit Type and Rh: 5100

## 2020-03-06 NOTE — Progress Notes (Addendum)
[address]       [place] [address]             [phone]      3 Days Post-Op Procedure(s) (LRB): CORONARY ARTERY BYPASS GRAFTING (CABG), ON PUMP, TIMES FIVE, USING LEFT INTERNAL MAMMARY ARTERY AND ENDOSCOPICALLY HARVESTED BILATERAL GREATER SAPHENOUS VEINS -flow track only (N/A) TRANSESOPHAGEAL ECHOCARDIOGRAM (TEE) (N/A) Subjective: Feels pretty well, some weakness, occas nausea   Objective: Vital signs in last 24 hours: Temp:  [98 F (36.7 C)-98.7 F (37.1 C)] 98.2 F (36.8 C) (04/04 0738) Pulse Rate:  [71-93] 80 (04/04 0738) Cardiac Rhythm: Normal sinus rhythm (04/04 0700) Resp:  [11-23] 18 (04/04 0738) BP: (107-141)/(58-90) 126/74 (04/04 0738) SpO2:  [93 %-98 %] 97 % (04/04 0738) Weight:  [84.6 kg] 84.6 kg (04/04 0456)  Hemodynamic parameters for last 24 hours:    Intake/Output from previous day: 04/03 0701 - 04/04 0700 In: 234.1 [P.O.:200; I.V.:34.1] Out: 2425 [Urine:2325; Chest Tube:100] Intake/Output this shift: No intake/output data recorded.  General appearance: alert, cooperative and no distress Heart: regular rate and rhythm Lungs: min dim in bases Abdomen: soft, nontender or distended Extremities: no edema Wound: incis healing well  Lab Results: Recent Labs    03/05/20 0433 03/06/20 0342  WBC 12.2* 11.5*  HGB 8.5* 9.1*  HCT 26.3* 27.2*  PLT 177 221   BMET:  Recent Labs    03/05/20 0433 03/06/20 0342  NA 136 138  K 3.8 3.7  CL 104 105  CO2 24 24  GLUCOSE 115* 96  BUN 20 20  CREATININE 1.11* 1.14*  CALCIUM 8.4* 8.5*    PT/INR:  Recent Labs    03/03/20 1420  LABPROT 19.7*  INR 1.7*   ABG    Component Value Date/Time   PHART 7.305 (L) 03/03/2020 2212   HCO3 19.4 (L) 03/03/2020 2212   TCO2 21 (L) 03/03/2020 2212   ACIDBASEDEF 6.0 (H) 03/03/2020 2212   O2SAT 93.0 03/03/2020 2212   CBG (last 3)  Recent Labs    03/05/20 0349 03/05/20 0718 03/05/20 1114  GLUCAP 104* 113* 119*    Meds Scheduled  Meds: . acetaminophen  1,000 mg Oral Q6H   Or  . acetaminophen (TYLENOL) oral liquid 160 mg/5 mL  1,000 mg Per Tube Q6H  . acetaminophen  1,000 mg Oral Once  . aspirin EC  325 mg Oral Daily   Or  . aspirin  324 mg Per Tube Daily  . atorvastatin  80 mg Oral q1800  . bisacodyl  10 mg Oral Daily   Or  . bisacodyl  10 mg Rectal Daily  . Chlorhexidine Gluconate Cloth  6 each Topical Daily  . [facility], Patient & Family Education   Does not apply Once  . docusate sodium  200 mg Oral Daily  . enoxaparin (LOVENOX) injection  30 mg Subcutaneous QHS  . furosemide  40 mg Oral Daily  . imipramine  50 mg Oral QHS  . mouth rinse  15 mL Mouth Rinse BID  . metFORMIN  1,000 mg Oral BID WC  . metoprolol tartrate  12.5 mg Oral BID   Or  . metoprolol tartrate  12.5 mg Per Tube BID  . mupirocin ointment  1 application Nasal BID  . pantoprazole  40 mg Oral Daily  . potassium chloride  20 mEq Oral BID  . sodium chloride flush  3 mL Intravenous Q12H   Continuous Infusions: . sodium chloride  PRN Meds:.sodium chloride, magnesium hydroxide, metoprolol tartrate, ondansetron (ZOFRAN) IV, oxyCODONE, sodium chloride flush, traMADol  Xrays DG Chest 2 View  Result Date: 03/06/2020 CLINICAL DATA:  Status post CABG surgery. EXAM: CHEST - 2 VIEW COMPARISON:  03/05/2020 and earlier exams. FINDINGS: All lines and tubes have been removed. Cardiac silhouette is mildly enlarged.  No mediastinal widening. Small pleural effusions. Mild thickening of the fissures. Lungs otherwise clear. No pneumothorax. IMPRESSION: 1. No acute findings or evidence of an operative complication. 2. Small residual pleural effusions. Electronically Signed   By: Lajean Manes M.D.   On: 03/06/2020 07:19   DG Chest Port 1 View  Result Date: 03/05/2020 CLINICAL DATA:  CABG EXAM: PORTABLE CHEST 1 VIEW COMPARISON:  Yesterday FINDINGS: Right IJ line with tip near the upper SVC. Chest tubes remain. Cardiomegaly after CABG that  is stable. Mildly low lung volumes with atelectasis. No visible pneumothorax. IMPRESSION: Stable postoperative chest.  No visible pneumothorax. Electronically Signed   By: Monte Fantasia M.D.   On: 03/05/2020 07:13    Assessment/Plan: S/P Procedure(s) (LRB): CORONARY ARTERY BYPASS GRAFTING (CABG), ON PUMP, TIMES FIVE, USING LEFT INTERNAL MAMMARY ARTERY AND ENDOSCOPICALLY HARVESTED BILATERAL GREATER SAPHENOUS VEINS -flow track only (N/A) TRANSESOPHAGEAL ECHOCARDIOGRAM (TEE) (N/A)  1 conts to progress well 2 hemodyn stable in sinus rhythm 3 sats good on RA 4 leukocytosis almost resolved 5 H/H stable, improving with equalibration 6 CXR with small effus 7 BS well controlled, back on metformin 8 mild volume overload, cont current diuretics, good UOP, creat min elevated at  1.14.  9 poss home 1-2 days, she is hoping for tuesday   LOS: 5 days    [name] PA-C Pager [phone] 03/06/2020 Patient seen and examined, agree with above  Remo Lipps C. Roxan Hockey, MD [facility] [phone]

## 2020-03-06 NOTE — Progress Notes (Signed)
No new developments. Remains in NSR. No neuro events. Plan to add clopidogrel at DC due to ACS presentation. Will follow from a distance until DC to arrange appropriate follow up.

## 2020-03-07 MED ORDER — TRAMADOL HCL 50 MG PO TABS: 50.0000 mg | ORAL_TABLET | Freq: Four times a day (QID) | ORAL | 0 refills | Status: DC | PRN

## 2020-03-07 MED ORDER — METOPROLOL TARTRATE 25 MG PO TABS: 12.5000 mg | ORAL_TABLET | Freq: Two times a day (BID) | ORAL | 1 refills | Status: DC

## 2020-03-07 MED ORDER — CLOPIDOGREL BISULFATE 75 MG PO TABS: 75.0000 mg | ORAL_TABLET | Freq: Every day | ORAL | 1 refills | Status: DC

## 2020-03-07 MED ORDER — ASPIRIN 81 MG PO TBEC: 81.0000 mg | DELAYED_RELEASE_TABLET | Freq: Every day | ORAL | Status: DC

## 2020-03-07 MED FILL — Sodium Bicarbonate IV Soln 8.4%: INTRAVENOUS | Qty: 50 | Status: AC

## 2020-03-07 MED FILL — Mannitol IV Soln 20%: INTRAVENOUS | Qty: 500 | Status: AC

## 2020-03-07 MED FILL — Albumin, Human Inj 5%: INTRAVENOUS | Qty: 250 | Status: AC

## 2020-03-07 MED FILL — Sodium Chloride IV Soln 0.9%: INTRAVENOUS | Qty: 2000 | Status: AC

## 2020-03-07 MED FILL — Calcium Chloride Inj 10%: INTRAVENOUS | Qty: 10 | Status: AC

## 2020-03-07 MED FILL — Electrolyte-R (PH 7.4) Solution: INTRAVENOUS | Qty: 5000 | Status: AC

## 2020-03-07 MED FILL — Heparin Sodium (Porcine) Inj 1000 Unit/ML: INTRAMUSCULAR | Qty: 10 | Status: AC

## 2020-03-07 MED FILL — Heparin Sodium (Porcine) Inj 1000 Unit/ML: INTRAMUSCULAR | Qty: 30 | Status: AC

## 2020-03-07 MED FILL — Lidocaine HCl Local Preservative Free (PF) Inj 2%: INTRAMUSCULAR | Qty: 15 | Status: AC

## 2020-03-07 MED FILL — Potassium Chloride Inj 2 mEq/ML: INTRAVENOUS | Qty: 40 | Status: AC

## 2020-03-07 MED FILL — METOPROLOL TARTRATE 25 MG T: 25 | 30 days supply | Qty: 30 | Fill #0

## 2020-03-07 MED FILL — CLOPIDOGREL 75 MG TABLET: 75 | 30 days supply | Qty: 30 | Fill #0

## 2020-03-07 MED FILL — traMADol HCL 50 MG TABS: 50 | 4 days supply | Qty: 28 | Fill #0

## 2020-03-07 NOTE — Progress Notes (Signed)
Primary Cardiologist :  [facility]   Subjective:  Denies SSCP, palpitations or Dyspnea   Objective:  Vitals:   03/06/20 1700 03/06/20 2021 03/07/20 0346 03/07/20 0347  BP: 134/61 124/69  131/63  Pulse:  90  84  Resp:  20  20  Temp: 98.3 F (36.8 C) 98.6 F (37 C)  98.3 F (36.8 C)  TempSrc: Oral Oral  Oral  SpO2: 96% 95%  94%  Weight:   82.4 kg   Height:        Intake/Output from previous day:  Intake/Output Summary (Last 24 hours) at 03/07/2020 0732 Last data filed at 03/06/2020 2142 Gross per 24 hour  Intake 700 ml  Output 2451 ml  Net -1751 ml    Physical Exam: Affect appropriate Healthy:  appears stated age 13: normal Neck supple with no adenopathy JVP normal no bruits no thyromegaly Lungs clear with no wheezing and good diaphragmatic motion Heart:  S1/S2 no murmur, no rub, gallop or click PMI normal post sternotomy  Abdomen: benighn, BS positve, no tenderness, no AAA no bruit.  No HSM or HJR Distal pulses intact with no bruits No edema Neuro non-focal Skin warm and dry No muscular weakness '  Lab Results: Basic Metabolic Panel: Recent Labs    03/04/20 1452 03/04/20 1452 03/05/20 0433 03/06/20 0342  NA 134*   < > 136 138  K 4.1   < > 3.8 3.7  CL 108   < > 104 105  CO2 23   < > 24 24  GLUCOSE 171*   < > 115* 96  BUN 19   < > 20 20  CREATININE 1.30*   < > 1.11* 1.14*  CALCIUM 7.9*   < > 8.4* 8.5*  MG 2.4  --   --   --    < > = values in this interval not displayed.   Liver Function Tests: No results for input(s): AST, ALT, ALKPHOS, BILITOT, PROT, ALBUMIN in the last 72 hours. No results for input(s): LIPASE, AMYLASE in the last 72 hours. CBC: Recent Labs    03/05/20 0433 03/06/20 0342  WBC 12.2* 11.5*  HGB 8.5* 9.1*  HCT 26.3* 27.2*  MCV 92.9 91.0  PLT 177 221    Imaging: DG Chest 2 View  Result Date: 03/06/2020 CLINICAL DATA:  Status post CABG surgery. EXAM: CHEST - 2 VIEW COMPARISON:  03/05/2020 and earlier exams.  FINDINGS: All lines and tubes have been removed. Cardiac silhouette is mildly enlarged.  No mediastinal widening. Small pleural effusions. Mild thickening of the fissures. Lungs otherwise clear. No pneumothorax. IMPRESSION: 1. No acute findings or evidence of an operative complication. 2. Small residual pleural effusions. Electronically Signed   By: Lajean Manes M.D.   On: 03/06/2020 07:19    Cardiac Studies:  ECG:    Telemetry:  NSR 03/07/2020    TTE 02/29/2020 1. Left ventricular ejection fraction, by estimation, is 45 to 50%. The  left ventricle has mildly decreased function. The left ventricle  demonstrates regional wall motion abnormalities (see scoring  diagram/findings for description). Inferior  hypokinesis. There is mild left ventricular hypertrophy. Left ventricular  diastolic parameters are indeterminate.  2. Right ventricular systolic function is normal. The right ventricular  size is normal.  3. The mitral valve is normal in structure. Trivial mitral valve  regurgitation.  4. The aortic valve was not well visualized. Aortic valve regurgitation  is not visualized. No aortic stenosis is present.   Cardiac catheterization 03/01/2020  Severe multivessel coronary disease  Segmental proximal to mid 90% LAD stenosis. Distal flow is TIMI grade II. Right coronary supplies LAD via septal perforator collaterals.  90% mid to distal LAD before the second obtuse marginal. 100% circumflex after the second obtuse marginal.  Total occlusion of distal RCA. Left ventricular branch and PDA fill by collaterals from the left coronary.  Distal left main calcified with 30 to 40% narrowing  Basal inferior to mid inferior wall motion abnormality hypokinetic to akinetic. EF 55%. LVEDP 16 mmHg  Medications:   . acetaminophen  1,000 mg Oral Q6H   Or  . acetaminophen (TYLENOL) oral liquid 160 mg/5 mL  1,000 mg Per Tube Q6H  . acetaminophen  1,000 mg Oral Once  . aspirin EC  325 mg  Oral Daily   Or  . aspirin  324 mg Per Tube Daily  . atorvastatin  80 mg Oral q1800  . bisacodyl  10 mg Oral Daily   Or  . bisacodyl  10 mg Rectal Daily  . Chlorhexidine Gluconate Cloth  6 each Topical Daily  . [place] [facility], Patient & Family Education   Does not apply Once  . docusate sodium  200 mg Oral Daily  . enoxaparin (LOVENOX) injection  30 mg Subcutaneous QHS  . furosemide  40 mg Oral Daily  . imipramine  50 mg Oral QHS  . mouth rinse  15 mL Mouth Rinse BID  . metFORMIN  1,000 mg Oral BID WC  . metoprolol tartrate  12.5 mg Oral BID   Or  . metoprolol tartrate  12.5 mg Per Tube BID  . mupirocin ointment  1 application Nasal BID  . pantoprazole  40 mg Oral Daily  . potassium chloride  20 mEq Oral BID  . sodium chloride flush  3 mL Intravenous Q12H     . sodium chloride      Assessment/Plan:   1.  CAD status post non-STEMI status post CABG x5 on 03/03/2020: Making very good progress.  On aspirin and statin and beta-blocker.  Recommend adding clopidogrel at discharge due to acute coronary event. 2. CVA: Small right cerebral infarction, symptoms have already resolved. 3. HTN: Controlled, limits the beta-blocker dose  4. DM: Recent hemoglobin A1c 7.1%  Add Plavix prior to d/c when Hct stable Will arrange outpatient f/u with Dr Jordan Likes [facility] 03/07/2020, 7:32 AM

## 2020-03-07 NOTE — Progress Notes (Signed)
CARDIAC REHAB PHASE I   PRE:  Rate/Rhythm: 98 SR  BP:  Sitting: 136/77      SaO2: 93 RA  MODE:  Ambulation: 250 ft   POST:  Rate/Rhythm: 106 ST  BP:  Sitting: 139/68    SaO2: 95 RA  Pt able to stand from lying position with ease while maintaining sternal precautions. Pt ambulated 260ft in hallway standby assist with front wheel walker. Pt took several short standing rest breaks c/o fatigue and SOB. Pt coached through purse lipped breathing, sats maintained throughout the walk. Pt able to demonstrate 750 on IS. Encouraged continued ambulation and IS use. Pt denies DME needs for home. Will continue to follow.  [id number] Rufina Falco, RN BSN 03/07/2020 10:20 AM

## 2020-03-07 NOTE — Progress Notes (Signed)
MOBILITY TEAM - Progress Note   03/07/20 1700  Mobility  Activity Ambulated in hall  Level of Assistance Standby assist, set-up cues, supervision of patient - no hands on  Assistive Device None  Distance Ambulated (ft) 210 ft  Mobility Response Tolerated fair  Mobility performed by Mobility specialist  Bed Position Chair   Patient agreeable to another walk. Remains limited by DOE.  Mabeline Caras, PT, DPT Mobility Team Pager [phone]

## 2020-03-07 NOTE — Progress Notes (Signed)
Dream station set up and ready for use.  Patient is watching TV and not ready for bed.  She was informed to have her nurse contact me when she is ready.

## 2020-03-07 NOTE — Progress Notes (Addendum)
[address]       [place] [address]             [phone]      4 Days Post-Op Procedure(s) (LRB): CORONARY ARTERY BYPASS GRAFTING (CABG), ON PUMP, TIMES FIVE, USING LEFT INTERNAL MAMMARY ARTERY AND ENDOSCOPICALLY HARVESTED BILATERAL GREATER SAPHENOUS VEINS -flow track only (N/A) TRANSESOPHAGEAL ECHOCARDIOGRAM (TEE) (N/A) Subjective: Feels well, ambulation improving  Objective: Vital signs in last 24 hours: Temp:  [98.2 F (36.8 C)-98.6 F (37 C)] 98.3 F (36.8 C) (04/05 0347) Pulse Rate:  [80-90] 84 (04/05 0347) Cardiac Rhythm: Normal sinus rhythm (04/04 1935) Resp:  [18-20] 20 (04/05 0347) BP: (124-134)/(61-74) 131/63 (04/05 0347) SpO2:  [94 %-97 %] 94 % (04/05 0347) Weight:  [82.4 kg] 82.4 kg (04/05 0346)  Hemodynamic parameters for last 24 hours:    Intake/Output from previous day: 04/04 0701 - 04/05 0700 In: 700 [P.O.:700] Out: 2651 [Urine:2650; Stool:1] Intake/Output this shift: No intake/output data recorded.  General appearance: alert, cooperative and no distress Heart: regular rate and rhythm Lungs: min dim in bases Abdomen: benign Extremities: nno edema Wound: incis healing well  Lab Results: Recent Labs    03/05/20 0433 03/06/20 0342  WBC 12.2* 11.5*  HGB 8.5* 9.1*  HCT 26.3* 27.2*  PLT 177 221   BMET:  Recent Labs    03/05/20 0433 03/06/20 0342  NA 136 138  K 3.8 3.7  CL 104 105  CO2 24 24  GLUCOSE 115* 96  BUN 20 20  CREATININE 1.11* 1.14*  CALCIUM 8.4* 8.5*    PT/INR: No results for input(s): LABPROT, INR in the last 72 hours. ABG    Component Value Date/Time   PHART 7.305 (L) 03/03/2020 2212   HCO3 19.4 (L) 03/03/2020 2212   TCO2 21 (L) 03/03/2020 2212   ACIDBASEDEF 6.0 (H) 03/03/2020 2212   O2SAT 93.0 03/03/2020 2212   CBG (last 3)  Recent Labs    03/05/20 0349 03/05/20 0718 03/05/20 1114  GLUCAP 104* 113* 119*    Meds Scheduled Meds: . acetaminophen  1,000 mg Oral Q6H   Or  .  acetaminophen (TYLENOL) oral liquid 160 mg/5 mL  1,000 mg Per Tube Q6H  . acetaminophen  1,000 mg Oral Once  . aspirin EC  325 mg Oral Daily   Or  . aspirin  324 mg Per Tube Daily  . atorvastatin  80 mg Oral q1800  . bisacodyl  10 mg Oral Daily   Or  . bisacodyl  10 mg Rectal Daily  . Chlorhexidine Gluconate Cloth  6 each Topical Daily  . [facility], Patient [facility] Family Education   Does not apply Once  . docusate sodium  200 mg Oral Daily  . enoxaparin (LOVENOX) injection  30 mg Subcutaneous QHS  . furosemide  40 mg Oral Daily  . imipramine  50 mg Oral QHS  . mouth rinse  15 mL Mouth Rinse BID  . metFORMIN  1,000 mg Oral BID WC  . metoprolol tartrate  12.5 mg Oral BID   Or  . metoprolol tartrate  12.5 mg Per Tube BID  . mupirocin ointment  1 application Nasal BID  . pantoprazole  40 mg Oral Daily  . potassium chloride  20 mEq Oral BID  . sodium chloride flush  3 mL Intravenous Q12H   Continuous Infusions: . sodium chloride     PRN Meds:.sodium chloride, magnesium hydroxide, metoprolol tartrate, ondansetron (ZOFRAN) IV, oxyCODONE, sodium chloride  flush, traMADol  Xrays DG Chest 2 View  Result Date: 03/06/2020 CLINICAL DATA:  Status post CABG surgery. EXAM: CHEST - 2 VIEW COMPARISON:  03/05/2020 and earlier exams. FINDINGS: All lines and tubes have been removed. Cardiac silhouette is mildly enlarged.  No mediastinal widening. Small pleural effusions. Mild thickening of the fissures. Lungs otherwise clear. No pneumothorax. IMPRESSION: 1. No acute findings or evidence of an operative complication. 2. Small residual pleural effusions. Electronically Signed   By: Lajean Manes M.D.   On: 03/06/2020 07:19    Assessment/Plan: S/P Procedure(s) (LRB): CORONARY ARTERY BYPASS GRAFTING (CABG), ON PUMP, TIMES FIVE, USING LEFT INTERNAL MAMMARY ARTERY AND ENDOSCOPICALLY HARVESTED BILATERAL GREATER SAPHENOUS VEINS -flow track only (N/A) TRANSESOPHAGEAL ECHOCARDIOGRAM (TEE)  (N/A)  1 doing well, hemodyn stable in sinus rhythm 2 sats good on RA 3 no new labs 4 ambulation improving but she still feels somewhat weak 5 she is trying to make arrangements for assistance at house for tomorrow   LOS: 6 days    [name] PA-C Pager [phone] 03/07/2020   Agree with above Doing well Home tomorrow  Lajuana Matte

## 2020-03-07 NOTE — Progress Notes (Signed)
MOBILITY TEAM - Progress Note   03/07/20 1519  Mobility  Activity Ambulated to bathroom;Ambulated in hall  Level of Assistance Standby assist, set-up cues, supervision of patient - no hands on  Assistive Device None  Distance Ambulated (ft) 230 ft  Mobility Response Tolerated fair  Mobility performed by Mobility specialist  Bed Position Chair   Patient motivated to participate, agreeable to attempt walk without walker. Required multiple standing rest breaks in hallway secondary to SOB and fatigue. SpO2 98% on RA, HR 108-120 with activity.  Mabeline Caras, PT, DPT Mobility Team Pager [phone]

## 2020-03-08 MED ORDER — LOSARTAN POTASSIUM 25 MG PO TABS: 25.0000 mg | ORAL_TABLET | Freq: Every day | ORAL | 3 refills | Status: DC

## 2020-03-08 MED ORDER — LOSARTAN POTASSIUM 25 MG PO TABS
25.0000 mg | ORAL_TABLET | Freq: Every day | ORAL | Status: DC
  Administered 2020-03-08: 25 mg via ORAL
  Filled 2020-03-08: qty 1

## 2020-03-08 MED FILL — LOSARTAN POTASSIUM 25 MG TA: 25 | 30 days supply | Qty: 30 | Fill #0

## 2020-03-08 NOTE — Progress Notes (Signed)
      [address]       [place] [address]             [phone]      5 Days Post-Op Procedure(s) (LRB): CORONARY ARTERY BYPASS GRAFTING (CABG), ON PUMP, TIMES FIVE, USING LEFT INTERNAL MAMMARY ARTERY AND ENDOSCOPICALLY HARVESTED BILATERAL GREATER SAPHENOUS VEINS -flow track only (N/A) TRANSESOPHAGEAL ECHOCARDIOGRAM (TEE) (N/A)   Subjective:  Patient is feeling well.  She is ready to go home today.  Objective: Vital signs in last 24 hours: Temp:  [97.8 F (36.6 C)-98.2 F (36.8 C)] 98.1 F (36.7 C) (04/06 0457) Pulse Rate:  [72-100] 100 (04/06 0457) Cardiac Rhythm: Normal sinus rhythm (04/06 0700) Resp:  [18-23] 20 (04/06 0457) BP: (129-131)/(67-77) 131/67 (04/06 0457) SpO2:  [94 %-97 %] 97 % (04/06 0457) Weight:  [82.1 kg] 82.1 kg (04/06 0457)  Intake/Output from previous day: 04/05 0701 - 04/06 0700 In: 720 [P.O.:720] Out: 600 [Urine:600]  General appearance: alert, cooperative and no distress Heart: regular rate and rhythm Lungs: clear to auscultation bilaterally Abdomen: soft, non-tender; bowel sounds normal; no masses,  no organomegaly Extremities: edema trace, ecchymosis Wound: clean and dry  Lab Results: Recent Labs    03/06/20 0342  WBC 11.5*  HGB 9.1*  HCT 27.2*  PLT 221   BMET:  Recent Labs    03/06/20 0342  NA 138  K 3.7  CL 105  CO2 24  GLUCOSE 96  BUN 20  CREATININE 1.14*  CALCIUM 8.5*    PT/INR: No results for input(s): LABPROT, INR in the last 72 hours. ABG    Component Value Date/Time   PHART 7.305 (L) 03/03/2020 2212   HCO3 19.4 (L) 03/03/2020 2212   TCO2 21 (L) 03/03/2020 2212   ACIDBASEDEF 6.0 (H) 03/03/2020 2212   O2SAT 93.0 03/03/2020 2212   CBG (last 3)  Recent Labs    03/05/20 1114  GLUCAP 119*    Assessment/Plan: S/P Procedure(s) (LRB): CORONARY ARTERY BYPASS GRAFTING (CABG), ON PUMP, TIMES FIVE, USING LEFT INTERNAL MAMMARY ARTERY AND ENDOSCOPICALLY HARVESTED BILATERAL GREATER SAPHENOUS VEINS  -flow track only (N/A) TRANSESOPHAGEAL ECHOCARDIOGRAM (TEE) (N/A)  1. CV- NSR, BP- continue Lopressor, will start Cozaar 2. Pulm- no acute issues, continue IS 3. Renal- weight is stable, continue Lasix 4. DM- sugars controlled, will resume home diabetic medications 5. Dispo- patient stable will d/c home today   LOS: 7 days    Ellwood Handler, PA-C  03/08/2020

## 2020-03-08 NOTE — Discharge Instructions (Signed)

## 2020-03-08 NOTE — Progress Notes (Signed)
Discharge AVS meds take and those due reviewed with pt. Follow up appointments and when to call MD reviewed. All questions and concerns addressed. No further questions at this time. D/c IV and TELE, CCMD notified. D/C home per orders.  

## 2020-03-08 NOTE — Progress Notes (Signed)
CARDIAC REHAB PHASE I   D/c education completed with pt. Pt educated on importance of site care and monitoring incision daily. Encouraged continued IS use, walks, and sternal precautions. Pt given in-the-tube sheet along with heart healthy and diabetic diets. Reviewed restrictions, site care, and exercise guidelines. Pt states she has a cane, front wheel walker, shower chair, BSC, and rollator at home. Strongly encouraged walker or rollator use until she feels strong on her feet. Emphasized importance of safety. Will refer to CRP II [place]. Pt is interested in participating in Virtual Cardiac and Pulmonary Rehab. Pt advised that Virtual Cardiac and Pulmonary Rehab is provided at no cost to the patient.  Checklist:  1. Pt has smart device  ie smartphone and/or ipad for downloading an app  Yes 2. Reliable internet/wifi service    Yes 3. Understands how to use their smartphone and navigate within an app.  Yes  Pt verbalized understanding and is in agreement.  [id number] Rufina Falco, RN BSN 03/08/2020 8:59 AM

## 2020-03-08 NOTE — Care Management Important Message (Signed)
Important Message  Patient Details  Name: Judy [name] MRN: [id number] Date of Birth: 05/22/1941   Medicare Important Message Given:  Yes     Shelda Altes 03/08/2020, 9:45 AM

## 2020-03-09 ENCOUNTER — Telehealth (HOSPITAL_COMMUNITY): Payer: Self-pay

## 2020-03-09 NOTE — Telephone Encounter (Signed)
Attempted to call patient in regards to Cardiac Rehab - LM on VM 

## 2020-03-09 NOTE — Telephone Encounter (Signed)
Pt insurance is active and benefits verified through [facility] Medicare. Co-pay $0.00, DED $0.00/$0.00 met, out of pocket $3,600.00/$30.00 met, co-insurance 0%. No pre-authorization required. Passport, 03/09/20 @ 9:33AM, TZG#[id number]  Will contact patient to see if she is interested in the Cardiac Rehab Program. If interested, patient will need to complete follow up appt. Once completed, patient will be contacted for scheduling upon review by the RN Navigator.

## 2020-03-10 ENCOUNTER — Encounter (HOSPITAL_COMMUNITY): Payer: Self-pay

## 2020-03-10 ENCOUNTER — Emergency Department (HOSPITAL_COMMUNITY): Payer: Medicare Other

## 2020-03-10 ENCOUNTER — Emergency Department (HOSPITAL_COMMUNITY)
Admission: EM | Admit: 2020-03-10 | Discharge: 2020-03-11 | Disposition: A | Discharge status: Home / Self Care | Payer: Medicare Other | Attending: Emergency Medicine | Admitting: Emergency Medicine

## 2020-03-10 ENCOUNTER — Other Ambulatory Visit: Payer: Self-pay

## 2020-03-10 DIAGNOSIS — Z79899 Other long term (current) drug therapy: Secondary | ICD-10-CM | POA: Insufficient documentation

## 2020-03-10 DIAGNOSIS — Z7984 Long term (current) use of oral hypoglycemic drugs: Secondary | ICD-10-CM | POA: Insufficient documentation

## 2020-03-10 DIAGNOSIS — R600 Localized edema: Secondary | ICD-10-CM | POA: Insufficient documentation

## 2020-03-10 DIAGNOSIS — Z8616 Personal history of COVID-19: Secondary | ICD-10-CM | POA: Insufficient documentation

## 2020-03-10 DIAGNOSIS — M25512 Pain in left shoulder: Secondary | ICD-10-CM | POA: Insufficient documentation

## 2020-03-10 DIAGNOSIS — I4891 Unspecified atrial fibrillation: Secondary | ICD-10-CM

## 2020-03-10 DIAGNOSIS — I480 Paroxysmal atrial fibrillation: Secondary | ICD-10-CM | POA: Diagnosis not present

## 2020-03-10 DIAGNOSIS — E119 Type 2 diabetes mellitus without complications: Secondary | ICD-10-CM | POA: Insufficient documentation

## 2020-03-10 DIAGNOSIS — Z20822 Contact with and (suspected) exposure to covid-19: Secondary | ICD-10-CM | POA: Insufficient documentation

## 2020-03-10 DIAGNOSIS — Z9889 Other specified postprocedural states: Secondary | ICD-10-CM | POA: Diagnosis not present

## 2020-03-10 DIAGNOSIS — Z951 Presence of aortocoronary bypass graft: Secondary | ICD-10-CM | POA: Diagnosis not present

## 2020-03-10 DIAGNOSIS — I10 Essential (primary) hypertension: Secondary | ICD-10-CM | POA: Insufficient documentation

## 2020-03-10 DIAGNOSIS — R0789 Other chest pain: Secondary | ICD-10-CM | POA: Diagnosis present

## 2020-03-10 LAB — BASIC METABOLIC PANEL
Anion gap: 14 (ref 5–15)
BUN: 18 mg/dL (ref 8–23)
CO2: 25 mmol/L (ref 22–32)
Calcium: 9.1 mg/dL (ref 8.9–10.3)
Chloride: 101 mmol/L (ref 98–111)
Creatinine, Ser: 1.14 mg/dL — ABNORMAL HIGH (ref 0.44–1.00)
GFR calc Af Amer: 54 mL/min — ABNORMAL LOW (ref >60)
GFR calc non Af Amer: 46 mL/min — ABNORMAL LOW (ref >60)
Glucose, Bld: 149 mg/dL — ABNORMAL HIGH (ref 70–99)
Potassium: 3.2 mmol/L — ABNORMAL LOW (ref 3.5–5.1)
Sodium: 140 mmol/L (ref 135–145)

## 2020-03-10 LAB — TROPONIN I (HIGH SENSITIVITY): Troponin I (High Sensitivity): 134 ng/L (ref <18)

## 2020-03-10 LAB — CBC
HCT: 31.8 % — ABNORMAL LOW (ref 36.0–46.0)
Hemoglobin: 10.1 g/dL — ABNORMAL LOW (ref 12.0–15.0)
MCH: 29.7 pg (ref 26.0–34.0)
MCHC: 31.8 g/dL (ref 30.0–36.0)
MCV: 93.5 fL (ref 80.0–100.0)
Platelets: 447 10*3/uL — ABNORMAL HIGH (ref 150–400)
RBC: 3.4 MIL/uL — ABNORMAL LOW (ref 3.87–5.11)
RDW: 13.9 % (ref 11.5–15.5)
WBC: 12.2 10*3/uL — ABNORMAL HIGH (ref 4.0–10.5)
nRBC: 0 % (ref 0.0–0.2)

## 2020-03-10 MED ORDER — SODIUM CHLORIDE 0.9% FLUSH: 3.0000 mL | Freq: Once | INTRAVENOUS | Status: DC

## 2020-03-10 NOTE — ED Triage Notes (Signed)
Pt bib [facility] from home w/ c/o L shoulder and arm pain. Pt has recent NSTEMI, stroke, and CABG.  Pt AOx4, denies CP, SOB.

## 2020-03-11 ENCOUNTER — Emergency Department (HOSPITAL_COMMUNITY): Payer: Medicare Other

## 2020-03-11 ENCOUNTER — Institutional Professional Consult (permissible substitution): Payer: Medicare Other | Admitting: Pulmonary Disease

## 2020-03-11 DIAGNOSIS — I4891 Unspecified atrial fibrillation: Secondary | ICD-10-CM

## 2020-03-11 DIAGNOSIS — I480 Paroxysmal atrial fibrillation: Secondary | ICD-10-CM

## 2020-03-11 LAB — TROPONIN I (HIGH SENSITIVITY): Troponin I (High Sensitivity): 121 ng/L (ref <18)

## 2020-03-11 LAB — SARS CORONAVIRUS 2 (TAT 6-24 HRS): SARS Coronavirus 2: NEGATIVE

## 2020-03-11 MED ORDER — METOPROLOL TARTRATE 5 MG/5ML IV SOLN
2.5000 mg | Freq: Once | INTRAVENOUS | Status: DC
  Filled 2020-03-11: qty 5

## 2020-03-11 MED ORDER — APIXABAN 5 MG PO TABS: 5.0000 mg | ORAL_TABLET | Freq: Two times a day (BID) | ORAL | 0 refills | Status: DC

## 2020-03-11 MED ORDER — HEPARIN (PORCINE) 25000 UT/250ML-% IV SOLN: 1300.0000 [IU]/h | INTRAVENOUS | Status: DC

## 2020-03-11 MED ORDER — METOPROLOL TARTRATE 25 MG PO TABS
25.0000 mg | ORAL_TABLET | Freq: Once | ORAL | Status: AC
  Administered 2020-03-11: 09:00:00 25 mg via ORAL
  Filled 2020-03-11: qty 1

## 2020-03-11 MED ORDER — APIXABAN 5 MG PO TABS
5.0000 mg | ORAL_TABLET | Freq: Two times a day (BID) | ORAL | Status: DC
  Administered 2020-03-11: 5 mg via ORAL
  Filled 2020-03-11 (×2): qty 1

## 2020-03-11 MED ORDER — METHOCARBAMOL 500 MG PO TABS: 500.0000 mg | ORAL_TABLET | Freq: Three times a day (TID) | ORAL | 0 refills | Status: DC | PRN

## 2020-03-11 MED ORDER — METOPROLOL TARTRATE 25 MG PO TABS
25.0000 mg | ORAL_TABLET | Freq: Two times a day (BID) | ORAL | Status: DC
  Administered 2020-03-11: 25 mg via ORAL
  Filled 2020-03-11: qty 1

## 2020-03-11 NOTE — Progress Notes (Addendum)
ANTICOAGULATION CONSULT NOTE - Initial Consult  Pharmacy Consult for Heparin >> Apixaban (see addendum) Indication: atrial fibrillation  No Known Allergies  Patient Measurements: Height: 5' (152.4 cm) Weight: 82.1 kg (181 lb) IBW/kg (Calculated) : 45.5 Heparin Dosing Weight: 64.4 kg  Vital Signs: Temp: 98.5 F (36.9 C) (04/08 2208) Temp Source: Oral (04/08 2208) BP: 141/79 (04/09 0839) Pulse Rate: 154 (04/09 0839)  Labs: Recent Labs    03/10/20 2240 03/11/20 0103  HGB 10.1*  --   HCT 31.8*  --   PLT 447*  --   CREATININE 1.14*  --   TROPONINIHS 134* 121*    Estimated Creatinine Clearance: 39.2 mL/min (A) (by C-G formula based on SCr of 1.14 mg/dL (H)).   Medical History: Past Medical History:  Diagnosis Date  . Anxiety   . Back pain   . Complication of anesthesia 2003   gallbladder surgery-resp. arrest-stop operation and pt. went to ICU - patient stated she has back surgery since then and had no trouble at all  . Depression   . Diabetes ([place])   . Dyspnea   . Gout   . History of kidney stones   . Hypertension   . Leg edema   . Pancreatitis, chronic ([place]) 03/2016  . Pneumonia due to COVID-19 virus 12/03/2019  . Sleep apnea     Medications:  (Not in a hospital admission)   Assessment: 21 YOF s/p recent CABG x 5 on 03/03/20 and also new CVA s/p cardiac cath that same admission who presented on 4/8 with CP and was found to have Afib. Pharmacy consulted to start heparin for anticoagulation.   The patient was noted to be on bASA + plavix PTA.  Given the patient's recent surgery and CVA - will hold off on a bolus with initiation. The patient was noted to be therapeutic on a rate of 1300 units/hr last admission - will initiate at this same rate. Hgb 10.1, plts 447  Goal of Therapy:  Heparin level 0.3-0.5 units/ml Monitor platelets by anticoagulation protocol: Yes   Plan:  - Start Heparin at a rate of 1300 units/hr (13 ml/hr) - Will continue to monitor for  any signs/symptoms of bleeding and will follow up with heparin level in 8 hours   Thank you for allowing pharmacy to be a part of this patient's care.  Alycia Rossetti, PharmD, BCPS Clinical Pharmacist Clinical phone for 03/11/2020: [phone] 03/11/2020 8:52 AM   **Pharmacist phone directory can now be found on [email or web address] ([email or web address]).  Listed under [facility].  -------------------------------------------------------------------------------------------- Addendum:  The patient has now converted out of Afib and the heparin drip has been discontinued. Cardiology has been consulted and recommended starting Apixaban. Will initiate at 5 mg bid given age<80, wt>60kg SCr<1.5.   Plan - Start Apixaban 5 mg bid - Pharmacy will plan to provide education prior to discharge - Will monitor for any signs/symptoms of bleeding  Alycia Rossetti, PharmD, BCPS  11:12 AM

## 2020-03-11 NOTE — ED Notes (Signed)
EDP at the bedside. Pt alert/oriented and in NAD.

## 2020-03-11 NOTE — ED Notes (Signed)
EDP back in room to assess pt due to elevated HR

## 2020-03-11 NOTE — ED Provider Notes (Signed)
[facility] EMERGENCY DEPARTMENT Provider Note   CSN: [id number] Arrival date & time: 03/10/20  2203     History Chief Complaint  Patient presents with  . Chest Pain    Judy [name] is a 79 y.o. female.  HPI Patient presents with left shoulder pain.  Has had for the last day or 2.  Recent CABG within the last week.  Complicated prior to that by an MI and small stroke.  Has been doing well at home.  Been doing more physical activity.  Noticed pain behind her left shoulder.  Worse with moving the arm.  No chest pain.  Not exertional.  Think she may have slept on it wrong.  No fevers or chills.  Has not felt her heart racing.  No swelling in her legs.  No history of irregular heartbeat.    Past Medical History:  Diagnosis Date  . Anxiety   . Back pain   . Complication of anesthesia 2003   gallbladder surgery-resp. arrest-stop operation and pt. went to ICU - patient stated she has back surgery since then and had no trouble at all  . Depression   . Diabetes ([place])   . Dyspnea   . Gout   . History of kidney stones   . Hypertension   . Leg edema   . Pancreatitis, chronic ([place]) 03/2016  . Pneumonia due to COVID-19 virus 12/03/2019  . Sleep apnea     Patient Active Problem List   Diagnosis Date Noted  . Atrial fibrillation ([place]) 03/11/2020  . S/P CABG (coronary artery bypass graft) 03/03/2020  . Cerebral thrombosis with cerebral infarction 03/02/2020  . NSTEMI (non-ST elevated myocardial infarction) ([place]) 02/29/2020  . Hyperlipidemia associated with type 2 diabetes mellitus ([place]) 02/09/2020  . Type 2 diabetes mellitus, controlled ([place]) 01/12/2020  . Class 2 severe obesity with serious comorbidity and body mass index (BMI) of 35.0 to 35.9 in adult, unspecified obesity type ([place]) 12/25/2018  . Obstructive sleep apnea 11/04/2017  . Lumbar stenosis with neurogenic claudication 11/19/2016  . HTN (hypertension) 03/21/2016  . Anxiety   . Depression     Past  Surgical History:  Procedure Laterality Date  . BACK SURGERY    . BREAST LUMPECTOMY WITH RADIOACTIVE SEED LOCALIZATION Right 10/23/2019   Procedure: RIGHT BREAST LUMPECTOMY WITH RADIOACTIVE SEED LOCALIZATION;  Surgeon: Jovita Kussmaul, MD;  Location: [place];  Service: General;  Laterality: Right;  . CHOLECYSTECTOMY    . COLONOSCOPY  2013  . CORONARY ARTERY BYPASS GRAFT N/A 03/03/2020   Procedure: CORONARY ARTERY BYPASS GRAFTING (CABG), ON PUMP, TIMES FIVE, USING LEFT INTERNAL MAMMARY ARTERY AND ENDOSCOPICALLY HARVESTED BILATERAL GREATER SAPHENOUS VEINS -flow track only;  Surgeon: Lajuana Matte, MD;  Location: [facility];  Service: Open Heart Surgery;  Laterality: N/A;  . LEFT HEART CATH AND CORONARY ANGIOGRAPHY N/A 03/01/2020   Procedure: LEFT HEART CATH AND CORONARY ANGIOGRAPHY;  Surgeon: Belva Crome, MD;  Location: [place] [facility];  Service: Cardiovascular;  Laterality: N/A;  . TEE WITHOUT CARDIOVERSION N/A 03/03/2020   Procedure: TRANSESOPHAGEAL ECHOCARDIOGRAM (TEE);  Surgeon: Lajuana Matte, MD;  Location: [place];  Service: Open Heart Surgery;  Laterality: N/A;     OB History    Gravida  1   Para      Term      Preterm      AB      Living  1     SAB      TAB  Ectopic      Multiple      Live Births              Family History  Problem Relation Age of Onset  . Dementia Mother   . AAA (abdominal aortic aneurysm) Father   . Breast cancer Maternal Aunt     Social History   Tobacco Use  . Smoking status: Never Smoker  . Smokeless tobacco: Never Used  Substance Use Topics  . Alcohol use: No    Alcohol/week: 0.0 standard drinks  . Drug use: No    Home Medications Prior to Admission medications   Medication Sig Start Date End Date Taking? Authorizing Provider  atorvastatin (LIPITOR) 20 MG tablet Take 20 mg by mouth at bedtime.  02/02/20  Yes [provider]  clopidogrel (PLAVIX) 75 MG tablet Take 1 tablet (75 mg total) by mouth daily.  03/07/20 03/07/21 Yes Gold, Wayne E, PA-C  empagliflozin (JARDIANCE) 10 MG TABS tablet Take 10 mg by mouth daily. Patient taking differently: Take 10 mg by mouth at bedtime.  01/12/20  Yes Elsie Stain, MD  furosemide (LASIX) 20 MG tablet Take 1 tablet (20 mg total) by mouth daily as needed for edema. 01/12/20 01/11/21 Yes Elsie Stain, MD  imipramine (TOFRANIL) 50 MG tablet Take 1 tablet (50 mg total) by mouth at bedtime. 01/12/20  Yes Elsie Stain, MD  losartan (COZAAR) 25 MG tablet Take 1 tablet (25 mg total) by mouth daily. 03/08/20  Yes Barrett, Erin R, PA-C  metFORMIN (GLUCOPHAGE) 1000 MG tablet Take 1 tablet (1,000 mg total) by mouth 2 (two) times daily with a meal. 01/12/20  Yes Elsie Stain, MD  metoprolol tartrate (LOPRESSOR) 25 MG tablet Take 0.5 tablets (12.5 mg total) by mouth 2 (two) times daily. 03/07/20  Yes Gold, Wayne E, PA-C  NON FORMULARY 1 each by Other route See admin instructions. CPAP machine nightly   Yes [provider]  nystatin cream (MYCOSTATIN) Apply 1 application topically 2 (two) times daily as needed for dry skin.  02/02/20  Yes [provider]  traMADol (ULTRAM) 50 MG tablet Take 1-2 tablets (50-100 mg total) by mouth every 6 (six) hours as needed for moderate pain. 03/07/20  Yes Gold, Wayne E, PA-C  apixaban (ELIQUIS) 5 MG TABS tablet Take 1 tablet (5 mg total) by mouth 2 (two) times daily. 03/11/20 04/10/20  Davonna Belling, MD  clonazePAM (KLONOPIN) 0.5 MG tablet Take 1 tablet (0.5 mg total) by mouth at bedtime. Patient not taking: Reported on 02/29/2020 01/12/20   Elsie Stain, MD  glucose blood (ACCU-CHEK AVIVA) test strip Test twice daily 01/13/20   Elsie Stain, MD  Lancets (ACCU-CHEK SAFE-T PRO) lancets Test twice daily 12/25/19   Elsie Stain, MD  methocarbamol (ROBAXIN) 500 MG tablet Take 1 tablet (500 mg total) by mouth every 8 (eight) hours as needed for muscle spasms. 03/11/20   Davonna Belling, MD    Allergies    Patient has  no known allergies.  Review of Systems   Review of Systems  Constitutional: Negative for appetite change and fever.  HENT: Negative for congestion.   Respiratory: Negative for shortness of breath.   Cardiovascular: Negative for chest pain.  Gastrointestinal: Negative for abdominal pain.  Genitourinary: Negative for flank pain.  Musculoskeletal: Negative for back pain.       Left shoulder pain.  Skin: Negative for rash.  Psychiatric/Behavioral: Negative for confusion.    Physical Exam Updated Vital  Signs BP (!) 141/81   Pulse 81   Temp 98.5 F (36.9 C) (Oral)   Resp 18   Ht 5' (1.524 m)   Wt 82.1 kg   SpO2 96%   BMI 35.35 kg/m   Physical Exam Vitals and nursing note reviewed.  HENT:     Head: Atraumatic.  Cardiovascular:     Rate and Rhythm: Normal rate and regular rhythm.  Pulmonary:     Breath sounds: No wheezing, rhonchi or rales.     Comments: Well-healing median sternotomy with scab. Musculoskeletal:     Right lower leg: Edema present.     Left lower leg: Edema present.     Comments: Tenderness to left posterior shoulder.  Shoulder not irritable.  Good range of motion.  No rash.  No crepitance or deformity.  Bruising on legs from vein harvesting sites.  Skin:    General: Skin is warm.     Capillary Refill: Capillary refill takes less than 2 seconds.  Neurological:     Mental Status: She is alert and oriented to person, place, and time.     ED Results / Procedures / Treatments   Labs (all labs ordered are listed, but only abnormal results are displayed) Labs Reviewed  BASIC METABOLIC PANEL - Abnormal; Notable for the following components:      Result Value   Potassium 3.2 (*)    Glucose, Bld 149 (*)    Creatinine, Ser 1.14 (*)    GFR calc non Af Amer 46 (*)    GFR calc Af Amer 54 (*)    All other components within normal limits  CBC - Abnormal; Notable for the following components:   WBC 12.2 (*)    RBC 3.40 (*)    Hemoglobin 10.1 (*)    HCT 31.8  (*)    Platelets 447 (*)    All other components within normal limits  TROPONIN I (HIGH SENSITIVITY) - Abnormal; Notable for the following components:   Troponin I (High Sensitivity) 134 (*)    All other components within normal limits  TROPONIN I (HIGH SENSITIVITY) - Abnormal; Notable for the following components:   Troponin I (High Sensitivity) 121 (*)    All other components within normal limits  SARS CORONAVIRUS 2 (TAT 6-24 HRS)    EKG EKG Interpretation  Date/Time:  Thursday March 10 2020 22:09:04 EDT Ventricular Rate:  86 PR Interval:  158 QRS Duration: 84 QT Interval:  372 QTC Calculation: 445 R Axis:   70 Text Interpretation: Normal sinus rhythm Anterior infarct , age undetermined Abnormal ECG Confirmed by Davonna Belling [phone]) on 03/11/2020 7:50:42 AM   Radiology DG Chest 2 View  Result Date: 03/10/2020 CLINICAL DATA:  79 year old female with chest pain. EXAM: CHEST - 2 VIEW COMPARISON:  Chest radiograph dated 03/06/2020. FINDINGS: Small left pleural effusion with minimal left lung base atelectasis. Pneumonia is not excluded clinical correlation is recommended. The right lung is clear. There is no pneumothorax. There is postsurgical changes of CABG with mild enlargement of the cardiomediastinal silhouette, improved since the prior radiograph. Atherosclerotic calcification of the aorta. Degenerative changes of the spine. Cholecystectomy clips. IMPRESSION: Small left pleural effusion with minimal left lung base atelectasis. Overall improved aeration of the lungs since the radiograph of 03/06/2020. Electronically Signed   By: Anner Crete M.D.   On: 03/10/2020 23:14   DG Shoulder Left  Result Date: 03/11/2020 CLINICAL DATA:  Shoulder pain EXAM: LEFT SHOULDER - 2+ VIEW COMPARISON:  None. FINDINGS: Alignment  is anatomic. There is no acute fracture. Joint space is preserved. IMPRESSION: No acute osseous abnormality. Electronically Signed   By: Macy Mis M.D.   On:  03/11/2020 09:52    Procedures Procedures (including critical care time)  Medications Ordered in ED Medications  sodium chloride flush (NS) 0.9 % injection 3 mL (has no administration in time range)  metoprolol tartrate (LOPRESSOR) tablet 25 mg (25 mg Oral Given 03/11/20 1319)  apixaban (ELIQUIS) tablet 5 mg (5 mg Oral Given 03/11/20 1319)  metoprolol tartrate (LOPRESSOR) tablet 25 mg (25 mg Oral Given 03/11/20 [id number])    ED Course  I have reviewed the triage vital signs and the nursing notes.  Pertinent labs & imaging results that were available during my care of the patient were reviewed by me and considered in my medical decision making (see chart for details).    MDM Rules/Calculators/A&P                      Patient presents with chest pain.  Appears to actually be left shoulder pain.  Likely musculoskeletal.  Tenderness posteriorly.  However while in the ER developed in atrial fibrillation versus flutter with RVR.  Cannot feel that she is going fast.  Although did start acutely in the ER easily could have been going on and off since the surgery and potentially even before the surgery with the stroke that she had.  Discussed with Dr. Ethlyn Daniels from cardiology.  Will give rate control medications and anticoagulate.  Will not cardiovert since we do not know onset and patient is not anticoagulated.  CRITICAL CARE Performed by: Davonna Belling Total critical care time: 30 minutes Critical care time was exclusive of separately billable procedures and treating other patients. Critical care was necessary to treat or prevent imminent or life-threatening deterioration. Critical care was time spent personally by me on the following activities: development of treatment plan with patient and/or surrogate as well as nursing, discussions with consultants, evaluation of patient's response to treatment, examination of patient, obtaining history from patient or surrogate, ordering and performing treatments  and interventions, ordering and review of laboratory studies, ordering and review of radiographic studies, pulse oximetry and re-evaluation of patient's condition.  chadsvasc score of 8, being positive for everything except CHF history.  While in the ER patient converted back to sinus rhythm after her oral medication.  Seen by cardiology.  Some adjustment of medications but discharged home with outpatient follow-up  Final Clinical Impression(s) / ED Diagnoses Final diagnoses:  Atrial fibrillation with rapid ventricular response ([place])  Acute pain of left shoulder    Rx / DC Orders ED Discharge Orders         Ordered    methocarbamol (ROBAXIN) 500 MG tablet  Every 8 hours PRN     03/11/20 1217    apixaban (ELIQUIS) 5 MG TABS tablet  2 times daily     03/11/20 1217           Davonna Belling, MD 03/11/20 1620

## 2020-03-11 NOTE — Discharge Instructions (Addendum)
Follow with your cardiologist and possibly atrial fibrillation clinic.  Information on my medicine - ELIQUIS (apixaban)  This medication education was reviewed with me or my healthcare representative as part of my discharge preparation.    Why was Eliquis prescribed for you? Eliquis was prescribed for you to reduce the risk of a blood clot forming that can cause a stroke if you have a medical condition called atrial fibrillation (a type of irregular heartbeat).  What do You need to know about Eliquis ? Take your Eliquis TWICE DAILY - one tablet in the morning and one tablet in the evening with or without food. If you have difficulty swallowing the tablet whole please discuss with your pharmacist how to take the medication safely.  Take Eliquis exactly as prescribed by your doctor and DO NOT stop taking Eliquis without talking to the doctor who prescribed the medication.  Stopping may increase your risk of developing a stroke.  Refill your prescription before you run out.  After discharge, you should have regular check-up appointments with your healthcare provider that is prescribing your Eliquis.  In the future your dose may need to be changed if your kidney function or weight changes by a significant amount or as you get older.  What do you do if you miss a dose? If you miss a dose, take it as soon as you remember on the same day and resume taking twice daily.  Do not take more than one dose of ELIQUIS at the same time to make up a missed dose.  Important Safety Information A possible side effect of Eliquis is bleeding. You should call your healthcare provider right away if you experience any of the following: Bleeding from an injury or your nose that does not stop. Unusual colored urine (red or dark brown) or unusual colored stools (red or black). Unusual bruising for unknown reasons. A serious fall or if you hit your head (even if there is no bleeding).  Some medicines may  interact with Eliquis and might increase your risk of bleeding or clotting while on Eliquis. To help avoid this, consult your healthcare provider or pharmacist prior to using any new prescription or non-prescription medications, including herbals, vitamins, non-steroidal anti-inflammatory drugs (NSAIDs) and supplements.  This website has more information on Eliquis (apixaban): http://www.eliquis.com/eliquis/home

## 2020-03-11 NOTE — Progress Notes (Signed)
Arrived to ED, RN reports pt no longer needs site. Will re-enter consult if status changes.

## 2020-03-11 NOTE — Consult Note (Addendum)
Cardiology consult note:   Patient ID: KIMA DASINGER MRN: [id number]; DOB: [birth year]   Admission date: 03/10/2020  Primary Care Provider: Michael Boston, MD Primary Cardiologist: Dr. Eleonore Chiquito, MD   Chief Complaint: Chest pain   Patient Profile:   Judy [name] is a 79 y.o. female with recent CABG (discharged 03/08/2020), obstructive sleep apnea on CPAP, COVID-19 in 11/2019, chronic pancreatitis, HTN, HLD and DM2 who is being seen for new onset atrial fibrillation with RVR.   History of Present Illness:   Judy [name] is a 79 year old female with a history stated above who initally presented to a [facility] 03/11/20 with complaints of chest tightness. Pt reports that since Wednesday she has been having severe left sided shoulder pain. She states that she may have slept on it wrong. It worsens with any arm movement and is reproducible on exam today. Yesterday, she states that she began having chest tightness with some mild dizziness. Given this she had her roommate take her to an outside facility for evaluation. She was then transported to the ED given atrial fibrillation on EKG. She denies associated symptoms such as nausea, vomiting, diaphoresis or shortness of breath.  She has had no palpitations or syncope.  She has no prior history of arrhythmias.   On ED presentation, EKG revealed atrial fibrillation with RVR. HST found to be 134>>121. Troponin levels during last hospital course prior to re-vascularization were [id number]. Creatinine stable at 1.14. CXR with small left pleural effusion with minimal left lung base atelectasis. Overall improved aeration of the lungs since the radiograph of 03/06/2020. Given shoulder pain, shoulder imaging performed which was negative. On my assessment, pt has spontaneously converted to NSR with good rate control. She reports that she missed her PM and AM doses of her medications due top being in the ED.    Judy [name] was last seen by our service after she  presented to the hospital with complaints of chest pain 02/29/20. During her workup in the ED she was noted to have ST changes and elevated HST levels. She was placed on a NTG drip with alleviation of her chest pain. She was subsequently taken to the catheterization lab on 03/01/2020 found to have multivessel CAD with reduced LVEF at 45%. It was felt that coronary bypass intervention would be indicated.  She then developed confusion and slurred speech at which time a code stroke was initiated and she was found to have a small cerebellar stroke. Cardiovascular work-up was pursued given that she was stable from a stroke standpoint. She underwent CABG x 5 utilizing LIMA to LAD, SVG to Diagonal, SVG to PLVB, SVG to OM1 and SVG OM 2.  She had an uncomplicated postoperative course.  She remained in normal sinus rhythm.   Past Medical History:  Diagnosis Date  . Anxiety   . Back pain   . Complication of anesthesia 2003   gallbladder surgery-resp. arrest-stop operation and pt. went to ICU - patient stated she has back surgery since then and had no trouble at all  . Depression   . Diabetes ([place])   . Dyspnea   . Gout   . History of kidney stones   . Hypertension   . Leg edema   . Pancreatitis, chronic ([place]) 03/2016  . Pneumonia due to COVID-19 virus 12/03/2019  . Sleep apnea     Past Surgical History:  Procedure Laterality Date  . BACK SURGERY    . BREAST LUMPECTOMY WITH RADIOACTIVE SEED LOCALIZATION Right 10/23/2019  Procedure: RIGHT BREAST LUMPECTOMY WITH RADIOACTIVE SEED LOCALIZATION;  Surgeon: Jovita Kussmaul, MD;  Location: [facility];  Service: General;  Laterality: Right;  . CHOLECYSTECTOMY    . COLONOSCOPY  2013  . CORONARY ARTERY BYPASS GRAFT N/A 03/03/2020   Procedure: CORONARY ARTERY BYPASS GRAFTING (CABG), ON PUMP, TIMES FIVE, USING LEFT INTERNAL MAMMARY ARTERY AND ENDOSCOPICALLY HARVESTED BILATERAL GREATER SAPHENOUS VEINS -flow track only;  Surgeon: Lajuana Matte, MD;  Location: [facility];   Service: Open Heart Surgery;  Laterality: N/A;  . LEFT HEART CATH AND CORONARY ANGIOGRAPHY N/A 03/01/2020   Procedure: LEFT HEART CATH AND CORONARY ANGIOGRAPHY;  Surgeon: Belva Crome, MD;  Location: [facility];  Service: Cardiovascular;  Laterality: N/A;  . TEE WITHOUT CARDIOVERSION N/A 03/03/2020   Procedure: TRANSESOPHAGEAL ECHOCARDIOGRAM (TEE);  Surgeon: Lajuana Matte, MD;  Location: [facility];  Service: Open Heart Surgery;  Laterality: N/A;     Medications Prior to Admission: Prior to Admission medications   Medication Sig Start Date End Date Taking? Authorizing Provider  aspirin EC 81 MG EC tablet Take 1 tablet (81 mg total) by mouth daily. 03/08/20   Gold, Wayne E, PA-C  atorvastatin (LIPITOR) 20 MG tablet Take 20 mg by mouth at bedtime.  02/02/20   [provider]  clonazePAM (KLONOPIN) 0.5 MG tablet Take 1 tablet (0.5 mg total) by mouth at bedtime. Patient not taking: Reported on 02/29/2020 01/12/20   Elsie Stain, MD  clopidogrel (PLAVIX) 75 MG tablet Take 1 tablet (75 mg total) by mouth daily. 03/07/20 03/07/21  Jadene Pierini E, PA-C  empagliflozin (JARDIANCE) 10 MG TABS tablet Take 10 mg by mouth daily. Patient taking differently: Take 10 mg by mouth at bedtime.  01/12/20   Elsie Stain, MD  furosemide (LASIX) 20 MG tablet Take 1 tablet (20 mg total) by mouth daily as needed for edema. 01/12/20 01/11/21  Elsie Stain, MD  glucose blood (ACCU-CHEK AVIVA) test strip Test twice daily 01/13/20   Elsie Stain, MD  imipramine (TOFRANIL) 50 MG tablet Take 1 tablet (50 mg total) by mouth at bedtime. 01/12/20   Elsie Stain, MD  Lancets (ACCU-CHEK SAFE-T PRO) lancets Test twice daily 12/25/19   Elsie Stain, MD  losartan (COZAAR) 25 MG tablet Take 1 tablet (25 mg total) by mouth daily. 03/08/20   Barrett, Erin R, PA-C  metFORMIN (GLUCOPHAGE) 1000 MG tablet Take 1 tablet (1,000 mg total) by mouth 2 (two) times daily with a meal. 01/12/20   Elsie Stain, MD    metoprolol tartrate (LOPRESSOR) 25 MG tablet Take 0.5 tablets (12.5 mg total) by mouth 2 (two) times daily. 03/07/20   Gold, Wayne E, PA-C  NON FORMULARY 1 each by Other route See admin instructions. CPAP machine nightly    [provider]  nystatin cream (MYCOSTATIN) Apply 1 application topically 2 (two) times daily as needed for dry skin.  02/02/20   [provider]  traMADol (ULTRAM) 50 MG tablet Take 1-2 tablets (50-100 mg total) by mouth every 6 (six) hours as needed for moderate pain. 03/07/20   [name], PA-C     Allergies:   No Known Allergies  Social History:   Social History   Socioeconomic History  . Marital status: Divorced    Spouse name: Not on file  . Number of children: Not on file  . Years of education: Not on file  . Highest education level: Not on file  Occupational History  .  Occupation: CNA  Tobacco Use  . Smoking status: Never Smoker  . Smokeless tobacco: Never Used  Substance and Sexual Activity  . Alcohol use: No    Alcohol/week: 0.0 standard drinks  . Drug use: No  . Sexual activity: Not Currently  Other Topics Concern  . Not on file  Social History Narrative  . Not on file   Social Determinants of Health   Financial Resource Strain:   . Difficulty of Paying Living Expenses:   Food Insecurity:   . Worried About Charity fundraiser in the Last Year:   . Arboriculturist in the Last Year:   Transportation Needs:   . Film/video editor (Medical):   [name] Lack of Transportation (Non-Medical):   Physical Activity:   . Days of Exercise per Week:   . Minutes of Exercise per Session:   Stress:   . Feeling of Stress :   Social Connections:   . Frequency of Communication with Friends and Family:   . Frequency of Social Gatherings with Friends and Family:   . Attends Religious Services:   . Active Member of Clubs or Organizations:   . Attends Archivist Meetings:   [name] Marital Status:   Intimate Partner Violence:   . Fear  of Current or Ex-Partner:   . Emotionally Abused:   [name] Physically Abused:   . Sexually Abused:     Family History:   The patient's family history includes AAA (abdominal aortic aneurysm) in her father; Breast cancer in her maternal aunt; Dementia in her mother.    ROS:  Please see the history of present illness.  All other ROS reviewed and negative.     Physical Exam/Data:   Vitals:   03/11/20 0815 03/11/20 0830 03/11/20 0839 03/11/20 0845  BP: 119/76 (!) 141/79 (!) 141/79 130/80  Pulse: (!) 152 (!) 158 (!) 154 (!) 156  Resp: (!) 27 16  (!) 21  Temp:      TempSrc:      SpO2: 93% 95%  97%  Weight:   82.1 kg   Height:   5' (1.524 m)    No intake or output data in the 24 hours ending 03/11/20 0939 Last 3 Weights 03/11/2020 03/08/2020 03/07/2020  Weight (lbs) 181 lb 181 lb 181 lb 10.5 oz  Weight (kg) 82.1 kg 82.101 kg 82.4 kg     Body mass index is 35.35 kg/m.   General: Well developed, well nourished, NAD Neck: Negative for carotid bruits. No JVD Lungs:Clear to ausculation bilaterally. No wheezes, rales, or rhonchi. Breathing is unlabored. Cardiovascular: RRR with S1 S2. No murmurs Abdomen: Soft, non-tender, non-distended. No obvious abdominal masses. Extremities: No edema. Radial  pulses 2+ bilaterally Neuro: Alert and oriented. No focal deficits. No facial asymmetry. MAE spontaneously. Psych: Responds to questions appropriately with normal affect.    EKG:  The ECG that was done 03/10/20 was personally reviewed and demonstrates NSR with no acute changes.   Relevant CV Studies:  TTE 02/29/2020 1. Left ventricular ejection fraction, by estimation, is 45 to 50%. The  left ventricle has mildly decreased function. The left ventricle  demonstrates regional wall motion abnormalities (see scoring  diagram/findings for description). Inferior  hypokinesis. There is mild left ventricular hypertrophy. Left ventricular  diastolic parameters are indeterminate.  2. Right ventricular  systolic function is normal. The right ventricular  size is normal.  3. The mitral valve is normal in structure. Trivial mitral valve  regurgitation.  4. The aortic  valve was not well visualized. Aortic valve regurgitation  is not visualized. No aortic stenosis is present.   Cardiac catheterization 03/01/2020  Severe multivessel coronary disease  Segmental proximal to mid 90% LAD stenosis. Distal flow is TIMI grade II. Right coronary supplies LAD via septal perforator collaterals.  90% mid to distal LAD before the second obtuse marginal. 100% circumflex after the second obtuse marginal.  Total occlusion of distal RCA. Left ventricular branch and PDA fill by collaterals from the left coronary.  Distal left main calcified with 30 to 40% narrowing  Basal inferior to mid inferior wall motion abnormality hypokinetic to akinetic. EF 55%. LVEDP 16 mmHg   Laboratory Data:  High Sensitivity Troponin:   Recent Labs  Lab 02/29/20 1732 03/01/20 0832 03/01/20 1010 03/10/20 2240 03/11/20 0103  TROPONINIHS 2,127* 13,775* 12,678* 134* 121*      Chemistry Recent Labs  Lab 03/06/20 0342 03/10/20 2240  NA 138 140  K 3.7 3.2*  CL 105 101  CO2 24 25  GLUCOSE 96 149*  BUN 20 18  CREATININE 1.14* 1.14*  CALCIUM 8.5* 9.1  GFRNONAA 46* 46*  GFRAA 54* 54*  ANIONGAP 9 14    No results for input(s): PROT, ALBUMIN, AST, ALT, ALKPHOS, BILITOT in the last 168 hours. Hematology Recent Labs  Lab 03/06/20 0342 03/10/20 2240  WBC 11.5* 12.2*  RBC 2.99* 3.40*  HGB 9.1* 10.1*  HCT 27.2* 31.8*  MCV 91.0 93.5  MCH 30.4 29.7  MCHC 33.5 31.8  RDW 13.6 13.9  PLT 221 447*   BNPNo results for input(s): BNP, PROBNP in the last 168 hours.  DDimer No results for input(s): DDIMER in the last 168 hours.   Radiology/Studies:  DG Chest 2 View  Result Date: 03/10/2020 CLINICAL DATA:  79 year old female with chest pain. EXAM: CHEST - 2 VIEW COMPARISON:  Chest radiograph dated 03/06/2020.  FINDINGS: Small left pleural effusion with minimal left lung base atelectasis. Pneumonia is not excluded clinical correlation is recommended. The right lung is clear. There is no pneumothorax. There is postsurgical changes of CABG with mild enlargement of the cardiomediastinal silhouette, improved since the prior radiograph. Atherosclerotic calcification of the aorta. Degenerative changes of the spine. Cholecystectomy clips. IMPRESSION: Small left pleural effusion with minimal left lung base atelectasis. Overall improved aeration of the lungs since the radiograph of 03/06/2020. Electronically Signed   By: Anner Crete M.D.   On: 03/10/2020 23:14   Assessment and Plan:   1.  New onset/postoperative atrial fibrillation: -Patient underwent CABG x5 on [id number] with an uncomplicated postoperative course.  She remained in normal sinus rhythm during her hospital stay.  She was discharged on 03/05/2020.  Unfortunately patient presented to an outside facility 03/10/20 with chest tightness found to be in AF wit RVR. She was thwen transported to the ED for further workup given her recent CABG. -Pt spontaneously converted to NSR with stable rates. Likely AF was in the setting of recent CABG and missed metoprolol dosing given being in the ED waiting room.  -Will start Eliquis for [facility] and stop ASA in the setting of recent stroke prior to CABG.  -Will continue PTA metoprolol however will increase dose to 25mg  PO given stable BP. Also will consider adding Amiodarone taper however will discuss with MD  -CHA2DS2VASc =at least 62 ( age, female, HTN, stroke, vascular disease)  2. CAD status post CABG x5 on 03/03/2020: -Patient admitted for non-STEMI 02/29/2020 found to have three-vessel coronary artery disease for which she underwent CABG x5  on 03/03/2020 per Dr. Kipp Brood. (LIMA-LAD, SVG-DIAG, SVG-PL, SVG-OM1, SVG-OM2) -Preoperative course complicated by small right cerebral infarct followed by neurology during her hospital  admission>> symptoms ultimately resolved and therefore CABG was pursued without any post operative complications -Plan at discharge was to add Plavix secondary to acute coronary event -She was discharged on ASA, statin, beta-blocker -Patient remained in NSR during her hospital course -Pt had chest tightness in the setting of AF with RVR>>>resolved now that she is in NSR -HST elevated but much lower than when she presented with NSTEMI -No EKG changes noted   2. Shoulder pain, left: -Negative imagine -Likely musculoskeletal in nature -Reproducible on exam  -Does not appear to be cardiac in etiology    3.  Small right cerebral infarct: -Pt became acutely confused post cath 3/30>>Head CT negative. CTA without significant stenoses. MRI showed subcentimeter acute R cerebellar infarct.  -Minimal carotid disease on [name] -Currently asymptomatic -Continue statin, Plavix  4.  Hypertension: -BP stable -Will up-titrate metoprolol dosing given stable BP and new AF    5.  Hyperlipidemia: -High intensity statin  6.  Diabetes: -A1c 7.1  For questions or updates, please contact [facility] Please consult [email or web address] for contact info under        Signed, Kathyrn Drown, NP  03/11/2020 9:39 AM   ---------------------------------------------------------------------------------------------   History and all data above reviewed.  Patient examined.  I agree with the findings as above.  Judy [name] is a very pleasant 79 year old female with the above-mentioned recent past medical history of non-STEMI, small cerebellar stroke, and CABG x5 on March 03, 2020.  She has had 3 days of severe musculoskeletal left shoulder pain, and decided to come to the emergency department.  She had an approximately 9-hour wait in the ER lobby, at which point she developed atrial fibrillation with rapid ventricular response.  She feels as if she was shaking back and forth.  She describes some mild chest discomfort  on her initial presentation, but is currently chest pain-free and had had no significant chest pain with the episode of atrial fibrillation.  It does not appear she had atrial fibrillation during her hospitalization.  Because of ACS she was started on aspirin and Plavix during her last hospitalization.  She also has the indication of stroke.  She is currently feeling well and is back in sinus rhythm at a rate of 75.  Constitutional: No acute distress ENMT: moist mucous membranes Cardiovascular: regular rhythm, normal rate, no murmurs. S1 and S2 normal. Radial pulses normal bilaterally. No jugular venous distention.  CHEST: Sternotomy incision is well-healing, no erythema, no significant tenderness.  Respiratory: clear to auscultation bilaterally GI : normal bowel sounds, soft and nontender. No distention.   MSK: extremities warm, well perfused. No edema. venous bypass harvest sites bilaterally are well-healing. NEURO: grossly nonfocal exam, moves all extremities. PSYCH: alert and oriented x 3, normal mood and affect.   All available labs, radiology testing, previous records reviewed. Agree with documented assessment and plan of my colleague as stated above with the following additions or changes:  Active Problems:   Atrial fibrillation ([place])    Plan: She developed approximately 2 to 3 hours of atrial fibrillation with rapid ventricular response while in the emergency department.  She was given her oral medications which had not been given since she was waiting in the emergency department lobby.  Given unknown duration of atrial fibrillation at home status post bypass surgery and not on anticoagulation, cardioversion was not recommended.  Fortunately she reverted to sinus rhythm independently.  Continue home medications which include metoprolol.  Given elevated CHA2DS2-VASc score and recent bypass surgery as a nidus for atrial fibrillation, we will initiate anticoagulation with Eliquis.  She is  on dual antiplatelet therapy for ACS prior to her CABG, we will continue Plavix.  She will be on dual therapy with Plavix and Eliquis, given anemia we will avoid triple therapy.  We will arrange close follow-up for her in our office to review duration of therapy and how she is tolerating.  This patients CHA2DS2-VASc Score and unadjusted Ischemic Stroke Rate (% per year) is equal to 10.8 % stroke rate/year from a score of 8 Above score calculated as 1 point each if present [CHF, HTN, DM, Vascular=MI/PAD/Aortic Plaque, Age if 65-74, or Female] Above score calculated as 2 points each if present [Age > 75, or Stroke/TIA/TE]  Length of Stay:  LOS: 0 days   Elouise Munroe, MD [facility] 11:21 AM  03/11/2020

## 2020-03-11 NOTE — ED Notes (Signed)
IV access attempted X2 without success. 

## 2020-03-11 NOTE — ED Notes (Signed)
Patient Alert and oriented to baseline. Stable and ambulatory to baseline. Patient verbalized understanding of the discharge instructions.  Patient belongings were taken by the patient.   

## 2020-03-14 NOTE — Anesthesia Postprocedure Evaluation (Signed)
Anesthesia Post Note  Patient: Judy [name]  Procedure(s) Performed: CORONARY ARTERY BYPASS GRAFTING (CABG), ON PUMP, TIMES FIVE, USING LEFT INTERNAL MAMMARY ARTERY AND ENDOSCOPICALLY HARVESTED BILATERAL GREATER SAPHENOUS VEINS -flow track only (N/A Chest) TRANSESOPHAGEAL ECHOCARDIOGRAM (TEE) (N/A )     Patient location during evaluation: SICU Anesthesia Type: General Level of consciousness: sedated Pain management: pain level controlled Vital Signs Assessment: post-procedure vital signs reviewed and stable Respiratory status: patient remains intubated per anesthesia plan Cardiovascular status: stable Postop Assessment: no apparent nausea or vomiting Anesthetic complications: no    Last Vitals:  Vitals:   03/08/20 0457 03/08/20 0942  BP: 131/67 138/74  Pulse: 100 (!) 104  Resp: 20 20  Temp: 36.7 C 36.7 C  SpO2: 97% 97%    Last Pain:  Vitals:   03/08/20 0942  TempSrc: Oral  PainSc: 0-No pain                 Tiajuana Amass

## 2020-03-17 ENCOUNTER — Ambulatory Visit: Payer: Medicare Other | Admitting: Physician Assistant

## 2020-03-17 ENCOUNTER — Encounter: Payer: Self-pay | Admitting: Physician Assistant

## 2020-03-17 ENCOUNTER — Other Ambulatory Visit: Payer: Self-pay

## 2020-03-17 VITALS — BP 108/60 | HR 87 | Temp 96.8°F | Ht 60.0 in | Wt 174.0 lb

## 2020-03-17 DIAGNOSIS — I10 Essential (primary) hypertension: Secondary | ICD-10-CM

## 2020-03-17 DIAGNOSIS — E119 Type 2 diabetes mellitus without complications: Secondary | ICD-10-CM

## 2020-03-17 DIAGNOSIS — E1169 Type 2 diabetes mellitus with other specified complication: Secondary | ICD-10-CM

## 2020-03-17 DIAGNOSIS — G4733 Obstructive sleep apnea (adult) (pediatric): Secondary | ICD-10-CM

## 2020-03-17 DIAGNOSIS — Z79899 Other long term (current) drug therapy: Secondary | ICD-10-CM | POA: Diagnosis not present

## 2020-03-17 DIAGNOSIS — Z9989 Dependence on other enabling machines and devices: Secondary | ICD-10-CM

## 2020-03-17 DIAGNOSIS — I25810 Atherosclerosis of coronary artery bypass graft(s) without angina pectoris: Secondary | ICD-10-CM | POA: Diagnosis not present

## 2020-03-17 DIAGNOSIS — I480 Paroxysmal atrial fibrillation: Secondary | ICD-10-CM

## 2020-03-17 DIAGNOSIS — E785 Hyperlipidemia, unspecified: Secondary | ICD-10-CM

## 2020-03-17 LAB — CBC
Hematocrit: 31.8 % — ABNORMAL LOW (ref 34.0–46.6)
Hemoglobin: 10.7 g/dL — ABNORMAL LOW (ref 11.1–15.9)
MCH: 30.4 pg (ref 26.6–33.0)
MCHC: 33.6 g/dL (ref 31.5–35.7)
MCV: 90 fL (ref 79–97)
Platelets: 552 10*3/uL — ABNORMAL HIGH (ref 150–450)
RBC: 3.52 x10E6/uL — ABNORMAL LOW (ref 3.77–5.28)
RDW: 13.5 % (ref 11.7–15.4)
WBC: 14.7 10*3/uL — ABNORMAL HIGH (ref 3.4–10.8)

## 2020-03-17 LAB — BASIC METABOLIC PANEL
BUN/Creatinine Ratio: 13 (ref 12–28)
BUN: 14 mg/dL (ref 8–27)
CO2: 23 mmol/L (ref 20–29)
Calcium: 9.6 mg/dL (ref 8.7–10.3)
Chloride: 103 mmol/L (ref 96–106)
Creatinine, Ser: 1.08 mg/dL — ABNORMAL HIGH (ref 0.57–1.00)
GFR calc Af Amer: 57 mL/min/{1.73_m2} — ABNORMAL LOW (ref >59)
GFR calc non Af Amer: 50 mL/min/{1.73_m2} — ABNORMAL LOW (ref >59)
Glucose: 96 mg/dL (ref 65–99)
Potassium: 4.6 mmol/L (ref 3.5–5.2)
Sodium: 138 mmol/L (ref 134–144)

## 2020-03-17 NOTE — Patient Instructions (Addendum)
Medication Instructions:  Your physician recommends that you continue on your current medications as directed. Please refer to the Current Medication list given to you today.  *If you need a refill on your cardiac medications before your next appointment, please call your pharmacy*   Lab Work: Your physician recommends that you return for lab work in [place]:   BMET  CBC If you have labs (blood work) drawn today and your tests are completely normal, you will receive your results only by: [name] MyChart Message (if you have MyChart) OR . A paper copy in the mail If you have any lab test that is abnormal or we need to change your treatment, we will call you to review the results.   Testing/Procedures: NONE ordered at this time of appointment   Follow-Up: At [facility], you and your health needs are our priority.  As part of our continuing mission to provide you with exceptional heart care, we have created designated Provider Care Teams.  These Care Teams include your primary Cardiologist (physician) and Advanced Practice Providers (APPs -  Physician Assistants and Nurse Practitioners) who all work together to provide you with the care you need, when you need it.  We recommend signing up for the patient portal called "MyChart".  Sign up information is provided on this After Visit Summary.  MyChart is used to connect with patients for Virtual Visits (Telemedicine).  Patients are able to view lab/test results, encounter notes, upcoming appointments, etc.  Non-urgent messages can be sent to your provider as well.   To learn more about what you can do with MyChart, go to [email or web address].    Your next appointment:   2-3 month(s)  The format for your next appointment:   In Person  Provider:   Eleonore Chiquito, MD  Other Instructions

## 2020-03-17 NOTE — Progress Notes (Signed)
Cardiology Office Note:    Date:  03/18/2020   ID:  Judy [name], DOB [birth year], MRN [id number]  PCP:  Michael Boston, MD  Cardiologist:  Evalina Field, MD  Electrophysiologist:  None   Referring MD: Michael Boston, MD   Chief Complaint  Patient presents with  . Follow-up    Left shoulder pain for 2 weeks.  [name] Headache  . Edema  . Shortness of Breath    History of Present Illness:    Judy [name] is a 79 y.o. female with a hx of OSA on CPAP, COVID 19 in 11/2019, chronic pancreatitis, HTN, HLD, DM II and recently diagnosed CAD.  Previous echocardiogram obtained on 03/23/2016 showed EF 60 to 65%, grade 1 DD.  More recently, patient was admitted on 02/29/2020 with NSTEMI.  Echocardiogram obtained on 02/29/2020 showed EF 45 to 50%, inferior hypokinesis, mild LVH.  Cardiac catheterization showed severe multivessel CAD with 90% mid LAD lesion, 90% mid to distal LAD lesion, 100% left circumflex occlusion after OM 2, calcified 30 to 40% narrowing in the distal left main, EF 55%.  Cardiothoracic surgery was consulted.  CT surgery felt she has good bypass target however this was complicated by episode of confusion.  MRI showed a subcentimeter cerebellar infarct.  After her mental status improved, she eventually underwent CABG x5 with LIMA to LAD, SVG to diagonal, SVG to PL, SVG to OM1 and SVG to OM 2 by Dr. Kipp Brood on 03/03/2020.  Post op, she had expected anemia and volume overload.  Hemoglobin dropped down to 6.7.  She was discharged on aspirin and Plavix.  Patient returned to the ED on 03/11/2020 with complaint of chest tightness.  She was found to have new onset of atrial fibrillation with RVR.  She was started on Eliquis along with Plavix.  Given recent anemia, the plan is to avoid triple therapy, therefore she is not on aspirin any further.  Fortunately she reverted back to sinus rhythm in the ED.  Patient presents today for cardiology office visit.  She denies any chest pain or shortness of  breath.  Her sternotomy scar is very well-healed.  She is on the both the Plavix and Eliquis and has not noticed significant bleeding.  Since discharge, she has taken only 2 doses of Lasix.  She does not have any lower extremity edema, orthopnea or PND.  She does have dyspnea on more strenuous exertion.  I recommended continue to increase activity level.  Once she is seen by Dr. Kipp Brood tomorrow, she can start on the cardiac rehab.  Unfortunately I am unable to uptitrate beta-blocker therapy given her borderline blood pressure today.  I will obtain a CBC and a basic metabolic panel to check her anemia and to make sure her renal function and the electrolyte is stable.  She has been seen by her PCP last week.  At this point, she has been having persistent left shoulder pain for over a week, left shoulder hurts more when she tries to lift up the arm or if I press on the area, I suspect this is musculoskeletal pain.  She also has poor appetite and insomnia after the bypass surgery as well, I encouraged her to discuss this issues with her PCP.  Otherwise she can follow-up with Dr. Audie Box in 2 to 3 months.    Past Medical History:  Diagnosis Date  . Anxiety   . Back pain   . Complication of anesthesia 2003   gallbladder surgery-resp. arrest-stop  operation and pt. went to ICU - patient stated she has back surgery since then and had no trouble at all  . Depression   . Diabetes ([place])   . Dyspnea   . Gout   . History of kidney stones   . Hypertension   . Leg edema   . Pancreatitis, chronic ([place]) 03/2016  . Pneumonia due to COVID-19 virus 12/03/2019  . Sleep apnea     Past Surgical History:  Procedure Laterality Date  . BACK SURGERY    . BREAST LUMPECTOMY WITH RADIOACTIVE SEED LOCALIZATION Right 10/23/2019   Procedure: RIGHT BREAST LUMPECTOMY WITH RADIOACTIVE SEED LOCALIZATION;  Surgeon: Jovita Kussmaul, MD;  Location: [facility];  Service: General;  Laterality: Right;  . CHOLECYSTECTOMY    . COLONOSCOPY   2013  . CORONARY ARTERY BYPASS GRAFT N/A 03/03/2020   Procedure: CORONARY ARTERY BYPASS GRAFTING (CABG), ON PUMP, TIMES FIVE, USING LEFT INTERNAL MAMMARY ARTERY AND ENDOSCOPICALLY HARVESTED BILATERAL GREATER SAPHENOUS VEINS -flow track only;  Surgeon: Lajuana Matte, MD;  Location: [facility];  Service: Open Heart Surgery;  Laterality: N/A;  . LEFT HEART CATH AND CORONARY ANGIOGRAPHY N/A 03/01/2020   Procedure: LEFT HEART CATH AND CORONARY ANGIOGRAPHY;  Surgeon: Belva Crome, MD;  Location: [facility];  Service: Cardiovascular;  Laterality: N/A;  . TEE WITHOUT CARDIOVERSION N/A 03/03/2020   Procedure: TRANSESOPHAGEAL ECHOCARDIOGRAM (TEE);  Surgeon: Lajuana Matte, MD;  Location: [facility];  Service: Open Heart Surgery;  Laterality: N/A;    Current Medications: Current Meds  Medication Sig  . apixaban (ELIQUIS) 5 MG TABS tablet Take 1 tablet (5 mg total) by mouth 2 (two) times daily.  [name] atorvastatin (LIPITOR) 20 MG tablet Take 20 mg by mouth at bedtime.   . clopidogrel (PLAVIX) 75 MG tablet Take 1 tablet (75 mg total) by mouth daily.  . empagliflozin (JARDIANCE) 10 MG TABS tablet Take 10 mg by mouth daily. (Patient taking differently: Take 10 mg by mouth at bedtime. )  . furosemide (LASIX) 20 MG tablet Take 1 tablet (20 mg total) by mouth daily as needed for edema.  [name] glucose blood (ACCU-CHEK AVIVA) test strip Test twice daily  . imipramine (TOFRANIL) 50 MG tablet Take 1 tablet (50 mg total) by mouth at bedtime.  . Lancets (ACCU-CHEK SAFE-T PRO) lancets Test twice daily  . losartan (COZAAR) 25 MG tablet Take 1 tablet (25 mg total) by mouth daily.  . metFORMIN (GLUCOPHAGE) 1000 MG tablet Take 1 tablet (1,000 mg total) by mouth 2 (two) times daily with a meal.  . methocarbamol (ROBAXIN) 500 MG tablet Take 1 tablet (500 mg total) by mouth every 8 (eight) hours as needed for muscle spasms.  . metoprolol tartrate (LOPRESSOR) 25 MG tablet Take 0.5 tablets (12.5 mg total) by mouth 2 (two)  times daily.  . NON FORMULARY 1 each by Other route See admin instructions. CPAP machine nightly  . nystatin cream (MYCOSTATIN) Apply 1 application topically 2 (two) times daily as needed for dry skin.   [name] traMADol (ULTRAM) 50 MG tablet Take 1-2 tablets (50-100 mg total) by mouth every 6 (six) hours as needed for moderate pain.     Allergies:   Patient has no known allergies.   Social History   Socioeconomic History  . Marital status: Divorced    Spouse name: Not on file  . Number of children: Not on file  . Years of education: Not on file  . Highest education level: Not on file  Occupational History  . Occupation: CNA  Tobacco Use  . Smoking status: Never Smoker  . Smokeless tobacco: Never Used  Substance and Sexual Activity  . Alcohol use: No    Alcohol/week: 0.0 standard drinks  . Drug use: No  . Sexual activity: Not Currently  Other Topics Concern  . Not on file  Social History Narrative  . Not on file   Social Determinants of Health   Financial Resource Strain:   . Difficulty of Paying Living Expenses:   Food Insecurity:   . Worried About Charity fundraiser in the Last Year:   . Arboriculturist in the Last Year:   Transportation Needs:   . Film/video editor (Medical):   [name] Lack of Transportation (Non-Medical):   Physical Activity:   . Days of Exercise per Week:   . Minutes of Exercise per Session:   Stress:   . Feeling of Stress :   Social Connections:   . Frequency of Communication with Friends and Family:   . Frequency of Social Gatherings with Friends and Family:   . Attends Religious Services:   . Active Member of Clubs or Organizations:   . Attends Archivist Meetings:   [name] Marital Status:      Family History: The patient's family history includes AAA (abdominal aortic aneurysm) in her father; Breast cancer in her maternal aunt; Dementia in her mother.  ROS:   Please see the history of present illness.     All other systems reviewed  and are negative.  EKGs/Labs/Other Studies Reviewed:    The following studies were reviewed today:  Echo 02/29/2020 1. Left ventricular ejection fraction, by estimation, is 45 to 50%. The  left ventricle has mildly decreased function. The left ventricle  demonstrates regional wall motion abnormalities (see scoring  diagram/findings for description). Inferior  hypokinesis. There is mild left ventricular hypertrophy. Left ventricular  diastolic parameters are indeterminate.  2. Right ventricular systolic function is normal. The right ventricular  size is normal.  3. The mitral valve is normal in structure. Trivial mitral valve  regurgitation.  4. The aortic valve was not well visualized. Aortic valve regurgitation  is not visualized. No aortic stenosis is present.    Cath 03/01/2020  Severe multivessel coronary disease  Segmental proximal to mid 90% LAD stenosis.  Distal flow is TIMI grade II.  Right coronary supplies LAD via septal perforator collaterals.  90% mid to distal LAD before the second obtuse marginal.  100% circumflex after the second obtuse marginal.  Total occlusion of distal RCA.  Left ventricular branch and PDA fill by collaterals from the left coronary.  Distal left main calcified with 30 to 40% narrowing  Basal inferior to mid inferior wall motion abnormality hypokinetic to akinetic.  EF 55%.  LVEDP 16 mmHg  RECOMMENDATIONS:   T CTS consultation for coronary bypass surgery which should be done soon.  Resume IV heparin  Continue IV nitroglycerin  Patient to notify of recurrent chest pain.  EKG:  EKG is ordered today.  The ekg ordered today demonstrates normal sinus rhythm without significant ST-T wave changes.  Recent Labs: 02/29/2020: ALT 23 03/04/2020: Magnesium 2.4 03/17/2020: BUN 14; Creatinine, Ser 1.08; Hemoglobin 10.7; Platelets 552; Potassium 4.6; Sodium 138  Recent Lipid Panel    Component Value Date/Time   CHOL 89 03/01/2020 0351   CHOL  184 06/19/2018 0958   TRIG 101 03/01/2020 0351   HDL 39 (L) 03/01/2020 0351   HDL 45  06/19/2018 0958   CHOLHDL 2.3 03/01/2020 0351   VLDL 20 03/01/2020 0351   LDLCALC 30 03/01/2020 0351   LDLCALC 107 (H) 06/19/2018 0958    Physical Exam:    VS:  BP 108/60 (BP Location: Left Arm, Patient Position: Sitting, Cuff Size: Normal)   Pulse 87   Temp (!) 96.8 F (36 C)   Ht 5' (1.524 m)   Wt 174 lb (78.9 kg)   BMI 33.98 kg/m     Wt Readings from Last 3 Encounters:  03/17/20 174 lb (78.9 kg)  03/11/20 181 lb (82.1 kg)  03/08/20 181 lb (82.1 kg)     GEN:  Well nourished, well developed in no acute distress HEENT: Normal NECK: No JVD; No carotid bruits LYMPHATICS: No lymphadenopathy CARDIAC: RRR, no murmurs, rubs, gallops RESPIRATORY:  Clear to auscultation without rales, wheezing or rhonchi  ABDOMEN: Soft, non-tender, non-distended MUSCULOSKELETAL:  No edema; No deformity  SKIN: Warm and dry NEUROLOGIC:  Alert and oriented x 3 PSYCHIATRIC:  Normal affect   ASSESSMENT:    1. Coronary artery disease involving coronary bypass graft of native heart without angina pectoris   2. Medication management   3. PAF (paroxysmal atrial fibrillation) ([place])   4. OSA on CPAP   5. Essential hypertension   6. Hyperlipidemia associated with type 2 diabetes mellitus ([place])   7. Controlled type 2 diabetes mellitus without complication, without long-term current use of insulin (HCC)    PLAN:    In order of problems listed above:  1. CAD s/p CABG: On Plavix.  Sternotomy scar well-healed.  Otherwise she denies any exertional symptoms since discharge.  She can start on cardiac rehab after she is seen by CV surgery tomorrow.  She has insomnia and loss of appetite since bypass surgery, I suggested she follows up with her PCP.  2. PAF: Continue metoprolol and Eliquis.  3. Obstructive sleep apnea: On CPAP therapy  4. Hypertension: Continue on current therapy.  Obtain CBC and basic metabolic  panel  5. Hyperlipidemia: On Lipitor  6. DM2: Managed by primary care provider   Medication Adjustments/Labs and Tests Ordered: Current medicines are reviewed at length with the patient today.  Concerns regarding medicines are outlined above.  Orders Placed This Encounter  Procedures  . Basic metabolic panel  . CBC  . EKG 12-Lead   No orders of the defined types were placed in this encounter.   Patient Instructions  Medication Instructions:  Your physician recommends that you continue on your current medications as directed. Please refer to the Current Medication list given to you today.  *If you need a refill on your cardiac medications before your next appointment, please call your pharmacy*   Lab Work: Your physician recommends that you return for lab work in [place]:   BMET  CBC If you have labs (blood work) drawn today and your tests are completely normal, you will receive your results only by: [name] MyChart Message (if you have MyChart) OR . A paper copy in the mail If you have any lab test that is abnormal or we need to change your treatment, we will call you to review the results.   Testing/Procedures: NONE ordered at this time of appointment   Follow-Up: At [facility], you and your health needs are our priority.  As part of our continuing mission to provide you with exceptional heart care, we have created designated Provider Care Teams.  These Care Teams include your primary Cardiologist (physician) and Advanced Practice Providers (  APPs -  Physician Assistants and Nurse Practitioners) who all work together to provide you with the care you need, when you need it.  We recommend signing up for the patient portal called "MyChart".  Sign up information is provided on this After Visit Summary.  MyChart is used to connect with patients for Virtual Visits (Telemedicine).  Patients are able to view lab/test results, encounter notes, upcoming appointments, etc.  Non-urgent  messages can be sent to your provider as well.   To learn more about what you can do with MyChart, go to [email or web address].    Your next appointment:   2-3 month(s)  The format for your next appointment:   In Person  Provider:   Eleonore Chiquito, MD  Other Instructions      Signed, Almyra Deforest, [place]  03/18/2020 12:02 AM    [place]

## 2020-03-18 ENCOUNTER — Other Ambulatory Visit: Payer: Self-pay | Admitting: *Deleted

## 2020-03-18 ENCOUNTER — Encounter: Payer: Self-pay | Admitting: Thoracic Surgery (Cardiothoracic Vascular Surgery)

## 2020-03-18 ENCOUNTER — Ambulatory Visit (INDEPENDENT_AMBULATORY_CARE_PROVIDER_SITE_OTHER): Payer: Self-pay | Admitting: Thoracic Surgery (Cardiothoracic Vascular Surgery)

## 2020-03-18 ENCOUNTER — Encounter: Payer: Self-pay | Admitting: Physician Assistant

## 2020-03-18 VITALS — BP 148/82 | HR 85 | Temp 97.5°F | Resp 20 | Ht 60.0 in | Wt 174.0 lb

## 2020-03-18 DIAGNOSIS — Z951 Presence of aortocoronary bypass graft: Secondary | ICD-10-CM

## 2020-03-18 NOTE — Progress Notes (Signed)
      [address]       [place] [address]             [phone]        Noralee H Freer [facility] Medical Record [id number] Date of Birth: [birth year]  Referring: Belva Crome, MD Primary Care: Michael Boston, MD Primary Cardiologist:Delmar Doreatha Lew, MD  Reason for visit:   follow-up  History of Present Illness:     Judy [name] comes in for her first follow-up appointment.  Overall she is doing well.  She complains of some left shoulder musculoskeletal pain, and bilateral lower extremity tingling.  She has not been taking her Lasix on a regular basis.  She continues to have some exertional dyspnea but I think this is mostly due to deconditioning.  She denies any chest pain she has had no neurologic sequela from her stroke.  Physical Exam: BP (!) 148/82 (BP Location: Left Arm, Patient Position: Sitting, Cuff Size: Normal)   Pulse 85   Temp (!) 97.5 F (36.4 C) (Temporal)   Resp 20   Ht 5' (1.524 m)   Wt 174 lb (78.9 kg)   SpO2 95% Comment: RA  BMI 33.98 kg/m   Alert NAD Incision clean.  Sternum stable Abdomen soft, ND 2+ peripheral edema     Assessment / Plan:   79 year old female status post CABG. Overall doing well, recommended that she take her Lasix on a regular basis to help with her lower extremity swelling and dyspnea.  She is also being tested for a new CPAP mask which will also be beneficial.    Judy [name] 03/18/2020 11:13 AM

## 2020-03-18 NOTE — Progress Notes (Signed)
Kidney function stable and red blood cell count slowly improving. However white blood cell count is elevated, she will need to see her PCP if any sign of infection such as fever or chill.

## 2020-03-18 NOTE — Patient Outreach (Signed)
[facility]) Care Management  03/18/2020  ARLETTA TORELL 04/17/1941 [id number]  EMMI-stroke  RED ON EMMI ALERT Day # 6 Date: Tuesday 03/15/20 1301  Red Alert Reason: Smoked or been around smoke? Yes   Insurance: united healthcare medicare   [facility] admissions x 2  ED visits x 2 in the last 6 months    Outreach attempt #1 successful Patient is able to verify HIPAA, DOB and address [facility] Care Management RN reviewed and addressed red alert with patient   EMMI:  Reports the EMMI alert reason is correct  She reports some smoking   Mrs Diprima confirms she is not interested in smoke cessation but agrees to [facility] smoke cessation education  She denies other medical concerns   Social: [name] is a 79 year old female who lives alone but reports support of her son and friends She denies concerns with completing her  Activities of daily living (ADLs) and  IADLs (instrumental Activities of Daily Living). She reports she is not driving at this time but her son and friends provide transportation to medical and other appointments/errands  She reports a history of depression but reports she is has a treatment plan and feels she is doing the best she can. PHQ-2 =2 and PHQ-9= 5  [facility] SW services and resources discussed but refused by patient  [name] was sent EMMI education on smoke cessation via her listed E mail in EPIC  Conditions: Hypertension (HTN), Non ST elevation myocardial infarction (NSTEMI), Atrial fibrillation, Obstructive sleep apnea (OSA), Diabetes (DM) type 2, anxiety, depression,  Lumbar stenosis with neurogenic claudication, class 2 severe obesity with serious comorbidity BMI 35-59, status post (s/p) Coronary artery bypass graft (CABG), never smoker   DME: cbg monitor, cane, cpap eyeglasses, grab bars in shower, reacher, shower chair with back, walker   Medications: She denies concerns with taking medications as prescribed, affording medications, side effects of  medications and questions about medications    Appointments: 03/27/20 dr Elsworth Soho pulmonology 04/19/20 Dr Elsworth Soho pulmonology 04/22/20 Dr Kipp Brood cardiothoraciac  05/04/20 Dr Audie Box cardiology 05/17/20 Dr Joya Gaskins  Internal medicine    Advance Directives: Denies need for assist with advance directives    Consent: [facility] RN CM reviewed [facility] services with patient. Patient gave verbal consent for services [facility] telephonic RN CM.   Advised patient that there will be further automated EMMI- post discharge calls to assess how the patient is doing following the recent hospitalization Advised the patient that another call may be received from a nurse if any of their responses were abnormal. Patient voiced understanding and was appreciative of f/u call.   Plan: [facility] RN CM will close case at this time as patient has been assessed and no needs identified/needs resolved.   Pt encouraged to return a call to [facility] [place] RN CM prn  Routed note to MDs/NP/PA   Joelene Millin L. Lavina Hamman, RN, BSN, [place] Coordinator Office number [phone] Mobile number [phone]  Main [facility] number [phone] Fax number [phone]

## 2020-03-24 ENCOUNTER — Other Ambulatory Visit: Payer: Self-pay | Admitting: *Deleted

## 2020-03-24 ENCOUNTER — Telehealth (HOSPITAL_COMMUNITY): Payer: Self-pay

## 2020-03-24 ENCOUNTER — Encounter (HOSPITAL_COMMUNITY): Payer: Self-pay

## 2020-03-24 ENCOUNTER — Ambulatory Visit: Payer: Medicare Other | Admitting: Physician Assistant

## 2020-03-24 NOTE — Telephone Encounter (Signed)
Attempted to call patient in regards to Cardiac Rehab - LM on VM Mailed letter 

## 2020-03-24 NOTE — Patient Outreach (Signed)
[facility]) Care Management  03/24/2020  Judy [name] 1941/06/14 [id number]   EMMI-stroke  RED ON EMMI ALERT Day # 9  Date:  Friday March 18 2020 [place] Reason: Feeling worse overall?yes Able to eat and drink? No Lost interest in things they used to enjoy? Yes Smoked or been around smoke? Yes Sad, hopeless, anxious, or empty?Yes  Insurance: united healthcare medicare    [facility] admissions x 2  ED visits x 2 in the last 6 months     Outreach attempt # 1 successful  Patient is able to verify HIPAA, DOB and address [facility] Care Management RN reviewed and addressed red alert with patient   EMMI:  Mrs Judy [name] reports she was having symptoms last week on a few days prior to [facility] RN CM 03/18/20 outreach to her but she reports her pain in her legs and feet resolved before 03/18/20 She denies any pain or medical concerns today 03/23/20.   She reports she is not eating as much as she wants to because she is trying to lose weight She states she is "fifty pounds over weight"   She reports there are still days she is not interested in things as she discussed on 03/18/20 but she reports she feels this is related to the medical conditions she has endured.  She states there are some days she "expects to be depressed and not wanting to do anything. On those days I do what my body feels like doing." She reports she "works through it"  She confirms support of her son and friends  She reports she has been recently getting out with a friend to walk in [employer] or [employer] each evening. She reports she walked in [employer] on 03/22/20 and plan to go back with her girlfriend on tonight. She report she girlfriend will drive her until she is able to return to driving "I hope I will be driving again as of next week. She states then she will be able to drive to other locations to walk She reports her road is not safe related to terrain to walk on at this time  She does not want to stop smoking  but still agrees to [facility] resources for smoke cessation plus   Mrs Judy [name] was sent additional EMMI materials for weight loss diet, depression and foot care for diabetics  Social: Mrs Judy [name] is a 79 year old female who lives alone but reports support of her son and friends She denies concerns with completing her  Activities of daily living (ADLs) and  IADLs (instrumental Activities of Daily Living). She reports she is not driving at this time but her son and friends provide transportation to medical and other appointments/errands  She reports a history of depression but reports she is has a treatment plan and feels she is doing the best she can. PHQ-2 =2 and PHQ-9= 5  [facility] SW services and resources discussed but refused by patient  Conditions: Hypertension (HTN), Non ST elevation myocardial infarction (NSTEMI), Atrial fibrillation, Obstructive sleep apnea (OSA), Diabetes (DM) type 2, anxiety, depression,  Lumbar stenosis with neurogenic claudication, class 2 severe obesity with serious comorbidity BMI 35-59, status post (s/p) Coronary artery bypass graft (CABG), never smoker   DME: cbg monitor, cane, cpap eyeglasses, grab bars in shower, reacher, shower chair with back, walker   Medications: She denies concerns with taking medications as prescribed, affording medications, side effects of medications and questions about medications     Appointments: 03/27/20 dr  alva pulmonology 04/19/20 Dr Elsworth Soho pulmonology 04/22/20 Dr Kipp Brood cardiothoracic  05/04/20 Dr Jenetta DownerNori Riis cardiology 05/17/20 Dr Joya [name]  Internal medicine     Advance Directives: Denies need for assist with advance directives   Consent: [facility] RN CM reviewed [facility] services with patient. Patient gave verbal consent for services [facility] telephonic RN CM.   Advised patient that there will be further automated EMMI- post discharge calls to assess how the patient is doing following the recent hospitalization Advised the patient that another call may  be received from a nurse if any of their responses were abnormal. Patient voiced understanding and was appreciative of f/u call.   Plan: [facility] RN CM will close case at this time as patient has been assessed and no needs identified/needs resolved. - not on APL   Pt encouraged to return a call to [place] CM prn  Routed note to MDs/NP/PA   Joelene Millin L. Lavina Hamman, RN, BSN, [place] Coordinator Office number [phone] Mobile number [phone]  Main [facility] number [phone] Fax number [phone]

## 2020-03-25 ENCOUNTER — Other Ambulatory Visit (HOSPITAL_COMMUNITY)
Admission: RE | Admit: 2020-03-25 | Discharge: 2020-03-25 | Disposition: A | Discharge status: Home / Self Care | Payer: Medicare Other | Source: Ambulatory Visit | Attending: Pulmonary Disease | Admitting: Pulmonary Disease

## 2020-03-25 DIAGNOSIS — Z01812 Encounter for preprocedural laboratory examination: Secondary | ICD-10-CM | POA: Insufficient documentation

## 2020-03-25 DIAGNOSIS — Z20822 Contact with and (suspected) exposure to covid-19: Secondary | ICD-10-CM | POA: Insufficient documentation

## 2020-03-25 LAB — SARS CORONAVIRUS 2 (TAT 6-24 HRS): SARS Coronavirus 2: NEGATIVE

## 2020-03-27 ENCOUNTER — Ambulatory Visit (HOSPITAL_BASED_OUTPATIENT_CLINIC_OR_DEPARTMENT_OTHER): Payer: Medicare Other | Attending: Critical Care Medicine | Admitting: Pulmonary Disease

## 2020-03-27 ENCOUNTER — Other Ambulatory Visit: Payer: Self-pay

## 2020-03-27 VITALS — Ht 60.0 in | Wt 174.0 lb

## 2020-03-27 DIAGNOSIS — G4733 Obstructive sleep apnea (adult) (pediatric): Secondary | ICD-10-CM

## 2020-03-27 DIAGNOSIS — Z79899 Other long term (current) drug therapy: Secondary | ICD-10-CM | POA: Diagnosis not present

## 2020-03-27 DIAGNOSIS — Z7984 Long term (current) use of oral hypoglycemic drugs: Secondary | ICD-10-CM | POA: Diagnosis not present

## 2020-03-27 DIAGNOSIS — E119 Type 2 diabetes mellitus without complications: Secondary | ICD-10-CM | POA: Insufficient documentation

## 2020-03-27 DIAGNOSIS — Z951 Presence of aortocoronary bypass graft: Secondary | ICD-10-CM | POA: Diagnosis not present

## 2020-03-28 ENCOUNTER — Other Ambulatory Visit (HOSPITAL_BASED_OUTPATIENT_CLINIC_OR_DEPARTMENT_OTHER): Payer: Self-pay

## 2020-03-28 DIAGNOSIS — G4733 Obstructive sleep apnea (adult) (pediatric): Secondary | ICD-10-CM

## 2020-03-30 ENCOUNTER — Telehealth: Payer: Self-pay

## 2020-03-30 NOTE — Telephone Encounter (Addendum)
Left a detailed voice message for the patient of the comments from Almyra Deforest, PA-C and asked that if she has any questions to give our office a call. I also released comments to patient's MyChart.   ----- Message from Almyra Deforest, [place] sent at 03/18/2020  5:24 PM EDT ----- Kidney function stable and red blood cell count slowly improving. However white blood cell count is elevated, she will need to see her PCP if any sign of infection such as fever or chill.

## 2020-03-31 ENCOUNTER — Telehealth: Payer: Self-pay | Admitting: Internal Medicine

## 2020-03-31 NOTE — Telephone Encounter (Signed)
Patient called in and requested to inform pcp that she has not been able to sleep for more than 2 hours for the past 2 weeks. Patient stated that she is starting to have bad anxiety attacks. Please follow up at your earliest convenience.

## 2020-03-31 NOTE — Telephone Encounter (Signed)
Needs to be seen, video/telephone ok,  Either tomorrow any provider or me on monday

## 2020-03-31 NOTE — Telephone Encounter (Signed)
Patient was called, no answer, lvm informing patient of pcp note.

## 2020-04-04 ENCOUNTER — Encounter: Payer: Self-pay | Admitting: Critical Care Medicine

## 2020-04-04 ENCOUNTER — Other Ambulatory Visit: Payer: Self-pay

## 2020-04-04 ENCOUNTER — Ambulatory Visit: Payer: Medicare Other | Attending: Critical Care Medicine | Admitting: Critical Care Medicine

## 2020-04-04 DIAGNOSIS — E119 Type 2 diabetes mellitus without complications: Secondary | ICD-10-CM | POA: Diagnosis not present

## 2020-04-04 DIAGNOSIS — N183 Chronic kidney disease, stage 3 unspecified: Secondary | ICD-10-CM

## 2020-04-04 DIAGNOSIS — Z6835 Body mass index (BMI) 35.0-35.9, adult: Secondary | ICD-10-CM

## 2020-04-04 DIAGNOSIS — E1159 Type 2 diabetes mellitus with other circulatory complications: Secondary | ICD-10-CM | POA: Diagnosis not present

## 2020-04-04 DIAGNOSIS — E1122 Type 2 diabetes mellitus with diabetic chronic kidney disease: Secondary | ICD-10-CM | POA: Diagnosis not present

## 2020-04-04 DIAGNOSIS — F419 Anxiety disorder, unspecified: Secondary | ICD-10-CM

## 2020-04-04 DIAGNOSIS — F329 Major depressive disorder, single episode, unspecified: Secondary | ICD-10-CM

## 2020-04-04 DIAGNOSIS — I10 Essential (primary) hypertension: Secondary | ICD-10-CM | POA: Diagnosis not present

## 2020-04-04 DIAGNOSIS — F32A Depression, unspecified: Secondary | ICD-10-CM

## 2020-04-04 DIAGNOSIS — G4733 Obstructive sleep apnea (adult) (pediatric): Secondary | ICD-10-CM

## 2020-04-04 DIAGNOSIS — I214 Non-ST elevation (NSTEMI) myocardial infarction: Secondary | ICD-10-CM

## 2020-04-04 DIAGNOSIS — I480 Paroxysmal atrial fibrillation: Secondary | ICD-10-CM

## 2020-04-04 DIAGNOSIS — I6330 Cerebral infarction due to thrombosis of unspecified cerebral artery: Secondary | ICD-10-CM

## 2020-04-04 DIAGNOSIS — Z951 Presence of aortocoronary bypass graft: Secondary | ICD-10-CM

## 2020-04-04 DIAGNOSIS — Z794 Long term (current) use of insulin: Secondary | ICD-10-CM

## 2020-04-04 DIAGNOSIS — E785 Hyperlipidemia, unspecified: Secondary | ICD-10-CM

## 2020-04-04 DIAGNOSIS — E1169 Type 2 diabetes mellitus with other specified complication: Secondary | ICD-10-CM

## 2020-04-04 MED ORDER — GLUCOSE BLOOD VI STRP: ORAL_STRIP | 2 refills | Status: AC

## 2020-04-04 MED ORDER — APIXABAN 5 MG PO TABS: 5.0000 mg | ORAL_TABLET | Freq: Two times a day (BID) | ORAL | 0 refills | Status: DC

## 2020-04-04 MED ORDER — JARDIANCE 10 MG PO TABS: 10.0000 mg | ORAL_TABLET | Freq: Every day | ORAL | 4 refills | Status: DC

## 2020-04-04 MED ORDER — ATORVASTATIN CALCIUM 20 MG PO TABS: 20.0000 mg | ORAL_TABLET | Freq: Every day | ORAL | 2 refills | Status: DC

## 2020-04-04 MED ORDER — FUROSEMIDE 20 MG PO TABS: 20.0000 mg | ORAL_TABLET | Freq: Every day | ORAL | 3 refills | Status: DC

## 2020-04-04 MED ORDER — IMIPRAMINE HCL 50 MG PO TABS: 50.0000 mg | ORAL_TABLET | Freq: Every day | ORAL | 4 refills | Status: DC

## 2020-04-04 MED ORDER — METFORMIN HCL 1000 MG PO TABS: 1000.0000 mg | ORAL_TABLET | Freq: Two times a day (BID) | ORAL | 4 refills | Status: DC

## 2020-04-04 MED ORDER — ACCU-CHEK SAFE-T PRO LANCETS MISC: 11 refills | Status: DC

## 2020-04-04 MED ORDER — LOSARTAN POTASSIUM 25 MG PO TABS: 25.0000 mg | ORAL_TABLET | Freq: Every day | ORAL | 3 refills | Status: DC

## 2020-04-04 MED ORDER — METOPROLOL TARTRATE 25 MG PO TABS: 12.5000 mg | ORAL_TABLET | Freq: Two times a day (BID) | ORAL | 1 refills | Status: DC

## 2020-04-04 NOTE — Assessment & Plan Note (Signed)
History of NSTEMI with diffuse coronary disease and associated atrial fibrillation  Associated hypertension is well  Status post emergent coronary bypass graft  Plan will be to continue current medications for now to include apixaban 5 mg twice daily, furosemide 20 mg daily, atorvastatin 20 mg daily, metoprolol 12 and half milligrams twice daily, losartan 25 mg daily  We will bring the patient back into the office for direct office exam in the next few weeks

## 2020-04-04 NOTE — Progress Notes (Signed)
Subjective:    Patient ID: Judy [name], female    DOB: [birth year], 79 y.o.   MRN: [id number]  Virtual Visit via Telephone Note  I connected with Judy [name] on 04/04/20 at  9:30 AM EDT by telephone and verified that I am speaking with the correct person using two identifiers.   Consent:  I discussed the limitations, risks, security and privacy concerns of performing an evaluation and management service by telephone and the availability of in person appointments. I also discussed with the patient that there may be a patient responsible charge related to this service. The patient expressed understanding and agreed to proceed.  Location of patient: The patient was at home  Location of provider: I was in the office  Persons participating in the televisit with the patient.   No one else on the call  History of Present Illness: 79 y.o.F post hosp for covid.  No pcp.  Hx HTN, OSA, T2DM, Depression, anxiety, Lumbar stenosis, Vit D and B12 deficiency   This is a telephone visit and post hospital visit as well for this patient who was in the hospital for Covid pneumonia between December 31 and January 8.  The patient used to have Dr. Zada Girt is here primary care physician but he just retired at the end of last year.  She is not yet achieved a new primary care physician.  The patient was admitted with hypoxemia and Covid pneumonia.  Since discharge she still does not have any energy has some headaches feels that she has nasal congestion notes some shortness of breath with exertion but no cough.  She has muscle aches but no fever.  She does not have a home pulse oximeter.  Physical therapy came to see her and she is walking better without the cane.  She is not yet gone out of her home.  She can do some ADLs but not many because of her situation.  She does have type 2 diabetes and her blood sugars have been running in the 140 range on 500 mg daily of Metformin.  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  She is wanting a when she can go back to work.  She has home aide coming in giving her baths.  She does not have a method to measure her blood pressure.  The blood pressure was elevated during the hospitalization.  Also she had hyperkalemia during the hospitalization.  She is on Aldactone.  Below is the discharge summary  Admit date: 12/03/2019 Discharge date: 12/11/2019  PCP: Patient, No Pcp Per  DISCHARGE DIAGNOSES:  Acute respiratory failure with hypoxia, resolved Pneumonia due to COVID-19 Chronic kidney disease stage IIIa Diabetes mellitus type 2 uncontrolled with hyperglycemia Essential hypertension History of anxiety and depression  RECOMMENDATIONS FOR OUTPATIENT FOLLOW UP: 1. Home health    Home Health: PT and OT Equipment/Devices: 3n 1  CODE STATUS: Full code  DISCHARGE CONDITION: fair  Diet recommendation: Modified carbohydrate  INITIAL HISTORY: 79 y.o.femalepast medical history significant for diabetes mellitus type 2, essential hypertension chronic pain presents to the ED with 3 days of shortness of breath the patient reports that she was diagnosed with COVID-19  days ago during this time she been having menorrhagia anorexia with worsening over the next 6 days, and about 4 days prior to admission she started developing shortness of breath fever chills in the ED she was noted to  be hypoxic in the 80s placed on 5 L and improved to 91%. She was also found to be hyponatremic hyperkalemic, SARS-CoV-2 PCR was positive chest x-ray showed right-sided infiltrates   HOSPITAL COURSE:   Acute respiratory failure with hypoxia secondarily to pneumonia due to COVID-19: Patient was hospitalized and placed on steroids and remdesivir.  She also received Actemra.  She started improving.  She is weaned down to room air.  She did feel anxious and nervous during this hospitalization.   Patient was reassured.  Her inflammatory markers improved.  Seen by PT and OT.  Home health will be ordered.  Son also lives close by and can provide assistance as well.  Chronic kidney disease stage IIIa/hyperkalemia With a baseline creatinine 1.2-1.5.Renal function has been stable. Hyperkalemia improved with Kayexalate.   Hypovolemic hyponatremia: In the setting of decreased oral intake and diuretic use. Her sodium improved with IV fluid hydration.  Diabetes mellitus type 2 uncontrolled with hyperglycemia/chronic kidney disease stage IIIa: Resume Metformin.HbA1c was 8.1.   Essential HTN (hypertension) Continue home medication  Chronic pain/anxiety/depression: Continue home medications  Obesity Estimated body mass index is 35 kg/m as calculated from the following: Height as of this encounter: 5' (1.524 m). Weight as of this encounter: 81.3 kg.   Note her hemoglobin A1c was 8.1 during the recent hospitalization and she also had hypokalemia and hyponatremia along with chronic kidney disease   2/9 This is a return visit that is face-to-face from this patient who was in the hospital end of December through 8 January for COVID-19 pneumonia.  The last visit was a telephone visit.  Today she comes into the office and has been weaned off oxygen but still remains quite short of breath with minimal exertion.  She had home physical therapy but that has been discontinued and she is actually fallen twice since that time.  She uses a walker but does not use it consistently.  She does have BiPAP for sleep apnea but only uses it at night.  She does not have a pulse oximeter at home.  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  She notes that her blood sugars tend to be fasting 207 to 150 and postprandial 1 60-1 80  This patient had been on Aldactone but we have held this because of hyperkalemia she does maintain diltiazem  daily for blood pressure.  She her primary care provider recently retired and she is going to be seeking a new primary care provider in private practice I did tell her I continue to see her for her pulmonary status  She is on zinc vitamin D and vitamin C supplementation at this time This patient also needs to have her vitamin B12 and vitamin D levels checked   02/09/2020 This is a pleasant 79 year old female seen today in return follow-up for severe Covid pneumonia and chronic hypoxic respiratory failure.  Note her respiratory failure has resolved and she is no longer on oxygen.  She is receiving medication for hypertension at 240 mg daily diltiazem.  Patient also has type 2 diabetes with chronic kidney disease and recent follow-up being that showed improvement in renal function and she was continued on Metformin and Jardiance.  Note in the interim she is obtained a new primary care provider and is establishing with this physician.  We will share records with this physician as well.  I told the patient today that we would prefer that I manage  her pulmonary aspects and let her primary care physician manage the rest of her medical issues.  She still needing a walker to move about and is very wobbly when she ambulates without assistance.  She knows to use the cane more regularly and the walker when needed.  She is continue her exercises from home physical therapy.  And she is going out outpatient physical therapy as well.  She has difficulty with sleeping at night and states the Klonopin is not helping as much and causes her to sleep over to 10:30 in the morning.  She is also on the Tofranil as well.  She is yet to try melatonin.  Also she has an old CPAP machine and is wishing to have this replaced as the mask is falling apart and the machine is 79 years old.  She will need a follow-up CPAP titration study and mask desensitization and I indicated her I would manage this  Note at the last visit her urine  microalbumin was normal she does however need a follow-up A1c and I am assuming her primary care provider will obtain this and the patient was unclear of this therefore we will obtain an A1c at this visit  04/04/2020 This patient is seen by way of a telephone visit and was last seen early March and since that time the patient was admitted March 29 with acute cerebral thrombosis with cerebral infarction and associated non-ST segment myocardial infarction with severe diffuse 5 vessel coronary disease.  Patient underwent emergent bypass surgery and has recovered slowly from this.  The patient states she is still short of breath with minimal exertion at this time.  The patient continues have difficulty sleeping.  She had a sleep study performed and could not be completed because she could not fall asleep for more than 2 hours.  The patient denies any edema in the lower extremities at this time.  Her diabetes is well controlled with blood sugars in the 100-95 range.  She has had post hospital follow-up with both open heart surgeon as well as cardiology  Below is the discharge summary Admit date: 02/29/2020 Discharge date: 03/08/2020  Admission Diagnoses:  Patient Active Problem List  Diagnosis Date Noted . Cerebral thrombosis with cerebral infarction 03/02/2020 . NSTEMI (non-ST elevated myocardial infarction) ([place]) 02/29/2020 . Hyperlipidemia associated with type 2 diabetes mellitus ([place]) 02/09/2020 . Type 2 diabetes mellitus, controlled ([place]) 01/12/2020 . Class 2 severe obesity with serious comorbidity and body mass index (BMI) of 35.0 to 35.9 in adult, unspecified obesity type ([place]) 12/25/2018 . Obstructive sleep apnea 11/04/2017 . Lumbar stenosis with neurogenic claudication 11/19/2016 . HTN (hypertension) 03/21/2016 . Anxiety  . Depression    Discharge Diagnoses:   Patient Active Problem List  Diagnosis Date Noted . S/P CABG (coronary artery bypass graft) 03/03/2020 . Cerebral  thrombosis with cerebral infarction 03/02/2020 . NSTEMI (non-ST elevated myocardial infarction) ([place]) 02/29/2020 . Hyperlipidemia associated with type 2 diabetes mellitus ([place]) 02/09/2020 . Type 2 diabetes mellitus, controlled ([place]) 01/12/2020 . Class 2 severe obesity with serious comorbidity and body mass index (BMI) of 35.0 to 35.9 in adult, unspecified obesity type ([place]) 12/25/2018 . Obstructive sleep apnea 11/04/2017 . Lumbar stenosis with neurogenic claudication 11/19/2016 . HTN (hypertension) 03/21/2016 . Anxiety  . Depression   Discharged Condition: good  History of Present Illness:  Mrs. [name] is a 79 yo female with DM and Hypertension.  She presented to the hospital with complaints of chest pain.  This was located centrally and radiated under  right breast.  This was associated with nausea.  During her workup in the ED she was noted to have ST changes and elevated Troponin levels.  She was placed on a NTG drip with alleviation of her chest pain.  She was admitted for further care.  Hospital Course:   She was taken to the catheterization lab on 03/01/2020.  She was noted to have multivessel CAD with reduced EF 45%.  It was felt coronary bypass grafting would be indicated.  The patient developed confusion and slurred speech.  Code stroke was initiated and she was found to have a small cerebellar stroke.  The patient was evaluated by Dr. Kipp Brood who felt coronary bypass grafting would be indicated.  The risks and benefits of the procedure were explained to the patient and she was agreeable to proceed.  The patient was stable from her stroke.  It was felt she could proceed to surgery.  She was taken to the operating room and underwent CABG x 5 utilizing LIMA to LAD, SVG to Diagonal, SVG to PLVB, SVG to OM1 and SVG OM 2.  She also underwent endoscopic harvest of greater saphenous vein from right leg.  The patient tolerated the procedure without difficulty and was taken to the SICU in  stable condition.  She was extubated the evening of surgery.  During her stay in the SICU the patient did not require inotropic support.  Her arterial line and swan ganz catheter were removed without difficulty.  She had expected post operative blood loss anemia.  Her hemoglobin dropped to 6.7 and she was transfused 1 unit of packed cells.  Her chest tube output decreased and her chest tubes were removed on 03/04/2020.  She has continued to make steady progress.  She has maintained sinus rhythm.  Oxygen has been weaned and she maintains good saturations on room air.  Incisions are noted to be healing well without evidence of infection.  She is tolerating diet without difficulty although has been somewhat slow to progress.  Activity progression is also been somewhat slow but she is making improvements.  She does have a walker at home which she will continue to use to assist with balance.  Renal function is very stable with most recent BUN/creatinine 20/1.14.  Her anemia is very stable at this point.  Most recent hemoglobin hematocrit dated 03/06/2020 are 9.1/27.2 respectively.  At the time of discharge the patient is felt to be quite stable.  Addendum:  Her BP is slowly increasing.  She was started on low dose Cozaar for additional control.  Significant Diagnostic Studies: angiography:    Severe multivessel coronary disease  Segmental proximal to mid 90% LAD stenosis. Distal flow is TIMI grade II. Right coronary supplies LAD via septal perforator collaterals.  90% mid to distal LAD before the second obtuse marginal. 100% circumflex after the second obtuse marginal.  Total occlusion of distal RCA. Left ventricular branch and PDA fill by collaterals from the left coronary.  Distal left main calcified with 30 to 40% narrowing  Basal inferior to mid inferior wall motion abnormality hypokinetic to akinetic. EF 55%. LVEDP 16 mmHg  Treatments: surgery:   CABG X5.LIMA to LAD, R SVG to OM  2,OM,3 first diagonal and PLV.The OM 3 graft was jumped off the hood of the first diagonal graft Endoscopic greater saphenous vein harvest on theright and left Intra-operative Transesophageal Echocardiogram   Past Medical History:  Diagnosis Date  . Anxiety   . Back pain   . Complication of  anesthesia 2003   gallbladder surgery-resp. arrest-stop operation and pt. went to ICU - patient stated she has back surgery since then and had no trouble at all  . Depression   . Diabetes ([place])   . Dyspnea   . Gout   . History of kidney stones   . Hypertension   . Leg edema   . Pancreatitis, chronic ([place]) 03/2016  . Pneumonia due to COVID-19 virus 12/03/2019  . Sleep apnea      Family History  Problem Relation Age of Onset  . Dementia Mother   . AAA (abdominal aortic aneurysm) Father   . Breast cancer Maternal Aunt      Social History   Socioeconomic History  . Marital status: Divorced    Spouse name: Not on file  . Number of children: Not on file  . Years of education: Not on file  . Highest education level: Not on file  Occupational History  . Occupation: CNA  Tobacco Use  . Smoking status: Never Smoker  . Smokeless tobacco: Never Used  Substance and Sexual Activity  . Alcohol use: No    Alcohol/week: 0.0 standard drinks  . Drug use: No  . Sexual activity: Not Currently  Other Topics Concern  . Not on file  Social History Narrative  . Not on file   Social Determinants of Health   Financial Resource Strain:   . Difficulty of Paying Living Expenses:   Food Insecurity:   . Worried About Charity fundraiser in the Last Year:   . Arboriculturist in the Last Year:   Transportation Needs:   . Film/video editor (Medical):   [name] Lack of Transportation (Non-Medical):   Physical Activity:   . Days of Exercise per Week:   . Minutes of Exercise per Session:   Stress:   . Feeling of Stress :   Social Connections:   . Frequency of Communication with Friends and  Family:   . Frequency of Social Gatherings with Friends and Family:   . Attends Religious Services:   . Active Member of Clubs or Organizations:   . Attends Archivist Meetings:   [name] Marital Status:   Intimate Partner Violence:   . Fear of Current or Ex-Partner:   . Emotionally Abused:   [name] Physically Abused:   . Sexually Abused:      No Known Allergies   Outpatient Medications Prior to Visit  Medication Sig Dispense Refill  . NON FORMULARY 1 each by Other route See admin instructions. CPAP machine nightly    . apixaban (ELIQUIS) 5 MG TABS tablet Take 1 tablet (5 mg total) by mouth 2 (two) times daily. 60 tablet 0  . atorvastatin (LIPITOR) 20 MG tablet Take 20 mg by mouth at bedtime.     . empagliflozin (JARDIANCE) 10 MG TABS tablet Take 10 mg by mouth daily. 30 tablet 4  . furosemide (LASIX) 20 MG tablet Take 1 tablet (20 mg total) by mouth daily as needed for edema. (Patient taking differently: Take 20 mg by mouth daily. ) 30 tablet 4  . glucose blood (ACCU-CHEK AVIVA) test strip Test twice daily 100 each 2  . imipramine (TOFRANIL) 50 MG tablet Take 1 tablet (50 mg total) by mouth at bedtime. 30 tablet 4  . Lancets (ACCU-CHEK SAFE-T PRO) lancets Test twice daily 100 each 11  . losartan (COZAAR) 25 MG tablet Take 1 tablet (25 mg total) by mouth daily. 30 tablet 3  . metFORMIN (  GLUCOPHAGE) 1000 MG tablet Take 1 tablet (1,000 mg total) by mouth 2 (two) times daily with a meal. 60 tablet 4  . metoprolol tartrate (LOPRESSOR) 25 MG tablet Take 0.5 tablets (12.5 mg total) by mouth 2 (two) times daily. 30 tablet 1  . nystatin cream (MYCOSTATIN) Apply 1 application topically 2 (two) times daily as needed for dry skin.     [name] clopidogrel (PLAVIX) 75 MG tablet Take 1 tablet (75 mg total) by mouth daily. (Patient not taking: Reported on 04/04/2020) 30 tablet 1  . methocarbamol (ROBAXIN) 500 MG tablet Take 1 tablet (500 mg total) by mouth every 8 (eight) hours as needed for muscle spasms.  (Patient not taking: Reported on 04/04/2020) 8 tablet 0  . traMADol (ULTRAM) 50 MG tablet Take 1-2 tablets (50-100 mg total) by mouth every 6 (six) hours as needed for moderate pain. (Patient not taking: Reported on 04/04/2020) 28 tablet 0   No facility-administered medications prior to visit.      Review of Systems  Constitutional: Positive for activity change and fatigue.  HENT: Negative.   Respiratory: Positive for chest tightness and shortness of breath. Negative for wheezing.   Cardiovascular: Negative for chest pain, palpitations and leg swelling.  Gastrointestinal: Negative.   Genitourinary: Negative.   Musculoskeletal: Positive for back pain.  Skin: Negative.   Psychiatric/Behavioral: Positive for sleep disturbance. Negative for self-injury and suicidal ideas.     Objective:   Physical Exam   No exam, telephone visit   All labs from recent hospitalization are reviewed BMP Latest Ref Rng & Units 03/17/2020 03/10/2020 03/06/2020  Glucose 65 - 99 mg/dL 96 149(H) 96  BUN 8 - 27 mg/dL _0 Creatinine 0.57 - 1.00 mg/dL 1.08(H) 1.14(H) 1.14(H)  BUN/Creat Ratio 12 - 28 13 - -  Sodium 134 - 144 mmol/L 138 140 138  Potassium 3.5 - 5.2 mmol/L 4.6 3.2(L) 3.7  Chloride 96 - 106 mmol/L 103 101 105  CO2 20 - 29 mmol/L _1 Calcium 8.7 - 10.3 mg/dL 9.6 9.1 8.5(L)   Hepatic Function Latest Ref Rng & Units 02/29/2020 01/12/2020 12/08/2019  Total Protein 6.5 - 8.1 g/dL 7.1 6.2 5.9(L)  Albumin 3.5 - 5.0 g/dL 4.1 3.8 2.9(L)  AST 15 - 41 U/L 20 11 37  ALT 0 - 44 U/L 23 17 40  Alk Phosphatase 38 - 126 U/L 75 109 61  Total Bilirubin 0.3 - 1.2 mg/dL 0.5 0.2 0.7   CBC Latest Ref Rng & Units 03/17/2020 03/10/2020 03/06/2020  WBC 3.4 - 10.8 x10E3/uL 14.7(H) 12.2(H) 11.5(H)  Hemoglobin 11.1 - 15.9 g/dL 10.7(L) 10.1(L) 9.1(L)  Hematocrit 34.0 - 46.6 % 31.8(L) 31.8(L) 27.2(L)  Platelets 150 - 450 x10E3/uL 552(H) 447(H) 221       Assessment & Plan:  I personally reviewed all images and lab data  in the [facility] system as well as any outside material available during this office visit and agree with the  radiology impressions.   NSTEMI (non-ST elevated myocardial infarction) ([place]) History of NSTEMI with diffuse coronary disease and associated atrial fibrillation  Associated hypertension is well  Status post emergent coronary bypass graft  Plan will be to continue current medications for now to include apixaban 5 mg twice daily, furosemide 20 mg daily, atorvastatin 20 mg daily, metoprolol 12 and half milligrams twice daily, losartan 25 mg daily  We will bring the patient back into the office for direct office exam in the next few weeks  HTN (hypertension)  As per coronary artery disease assessment  Obstructive sleep apnea Not able to obtain sleep study because not able to sleep more than 2 hours during the study  I will query the sleep specialist to see if another option can be considered  Type 2 diabetes mellitus, controlled ([place]) By report diabetes under good control we will continue Metformin and Jardiance  S/P CABG (coronary artery bypass graft) As per cardiac surgery  Hyperlipidemia associated with type 2 diabetes mellitus ([place]) Continue Lipitor   Inga was seen today for hospitalization follow-up.  Diagnoses and all orders for this visit:  Controlled type 2 diabetes mellitus with other circulatory complication, with long-term current use of insulin ([place])  Type 2 diabetes mellitus with stage 3 chronic kidney disease, without long-term current use of insulin ([facility]) -     glucose blood (ACCU-CHEK AVIVA) test strip; Test twice daily -     Lancets (ACCU-CHEK SAFE-T PRO) lancets; Test twice daily  Type 2 diabetes mellitus without complication, without long-term current use of insulin ([facility]) -     metFORMIN (GLUCOPHAGE) 1000 MG tablet; Take 1 tablet (1,000 mg total) by mouth 2 (two) times daily with a meal.  Essential hypertension  Obstructive sleep apnea  Hyperlipidemia  associated with type 2 diabetes mellitus ([facility])  S/P CABG (coronary artery bypass graft)  Paroxysmal atrial fibrillation ([facility])  Class 2 severe obesity with serious comorbidity and body mass index (BMI) of 35.0 to 35.9 in adult, unspecified obesity type ([facility])  Cerebral thrombosis with cerebral infarction  NSTEMI (non-ST elevated myocardial infarction) ([place])  Anxiety  Depression, unspecified depression type  Other orders -     apixaban (ELIQUIS) 5 MG TABS tablet; Take 1 tablet (5 mg total) by mouth 2 (two) times daily. -     atorvastatin (LIPITOR) 20 MG tablet; Take 1 tablet (20 mg total) by mouth at bedtime. -     empagliflozin (JARDIANCE) 10 MG TABS tablet; Take 10 mg by mouth daily. -     furosemide (LASIX) 20 MG tablet; Take 1 tablet (20 mg total) by mouth daily. -     imipramine (TOFRANIL) 50 MG tablet; Take 1 tablet (50 mg total) by mouth at bedtime. -     losartan (COZAAR) 25 MG tablet; Take 1 tablet (25 mg total) by mouth daily. -     metoprolol tartrate (LOPRESSOR) 25 MG tablet; Take 0.5 tablets (12.5 mg total) by mouth 2 (two) times daily.

## 2020-04-04 NOTE — Assessment & Plan Note (Signed)
As per cardiac surgery

## 2020-04-04 NOTE — Addendum Note (Signed)
Addended by: Asencion Noble E on: 04/04/2020 03:45 PM   Modules accepted: Orders

## 2020-04-04 NOTE — Assessment & Plan Note (Signed)
Not able to obtain sleep study because not able to sleep more than 2 hours during the study  I will query the sleep specialist to see if another option can be considered

## 2020-04-04 NOTE — Assessment & Plan Note (Signed)
-  Continue Lipitor °

## 2020-04-04 NOTE — Assessment & Plan Note (Signed)
By report diabetes under good control we will continue Metformin and Jardiance

## 2020-04-04 NOTE — Assessment & Plan Note (Signed)
As per coronary artery disease assessment 

## 2020-04-04 NOTE — Procedures (Signed)
Patient Name: Judy [name], Judy [name] Date: 03/27/2020 Gender: Female D.O.B: [birth year] Age (years): 78 Referring Provider: Asencion Noble Height (inches): 34 Interpreting Physician: Kara Mead MD, ABSM Weight (lbs): 174 RPSGT: Gwenyth Allegra BMI: 34 MRN: [id number] Neck Size: 16.00 <br> <br> CLINICAL INFORMATION Sleep Study Type: NPSG    Indication for sleep study: Diabetes , CABG, snoring, non refreshing sleep    Epworth Sleepiness Score: 10    SLEEP STUDY TECHNIQUE As per the AASM Manual for the Scoring of Sleep and Associated Events v2.3 (April 2016) with a hypopnea requiring 4% desaturations.  The channels recorded and monitored were frontal, central and occipital EEG, electrooculogram (EOG), submentalis EMG (chin), nasal and oral airflow, thoracic and abdominal wall motion, anterior tibialis EMG, snore microphone, electrocardiogram, and pulse oximetry.  MEDICATIONS Medications self-administered by patient taken the night of the study : Eliquis, METOPROLOL TARTRATE, METFORMIN, ATORVASTATIN, MELATONIN, IMIPRAMINE  SLEEP ARCHITECTURE The study was initiated at 10:32:52 PM and ended at 4:35:30 AM.  Sleep onset time was 43.8 minutes and the sleep efficiency was 20.3%%. The total sleep time was 73.5 minutes.  Stage REM latency was N/A minutes.  The patient spent 22.4%% of the night in stage N1 sleep, 77.6%% in stage N2 sleep, 0.0%% in stage N3 and 0% in REM.  Alpha intrusion was absent.  Supine sleep was 6.80%.  RESPIRATORY PARAMETERS The overall apnea/hypopnea index (AHI) was 15.5 per hour. There were 0 total apneas, including 0 obstructive, 0 central and 0 mixed apneas. There were 19 hypopneas and 23 RERAs.  The AHI during Stage REM sleep was N/A per hour.  AHI while supine was 36.0 per hour.  The mean oxygen saturation was 93.7%. The minimum SpO2 during sleep was 89.0%.  snoring was noted during this study.  CARDIAC DATA The 2 lead EKG demonstrated sinus  rhythm. The mean heart rate was 80.2 beats per minute. Other EKG findings include: PVCs.   LEG MOVEMENT DATA The total PLMS were 0 with a resulting PLMS index of 0.0. Associated arousal with leg movement index was 13.1 .  IMPRESSIONS - Mild -moderate obstructive sleep apnea occurred during this study (AHI = 15.5/h). - No significant central sleep apnea occurred during this study (CAI = 0.0/h). - The patient had minimal or no oxygen desaturation during the study (Min O2 = 89.0%) - No snoring was audible during this study. - EKG findings include PVCs. - Clinically significant periodic limb movements did not occur during sleep. Associated arousals were significant.   DIAGNOSIS - Obstructive Sleep Apnea (327.23 [G47.33 ICD-10])   RECOMMENDATIONS - Even though TST was low, she probably has mild- moderate OSA.Can repeat as home sleep study or empiric trial of autoCPAP - Positional therapy avoiding supine position during sleep. - Avoid alcohol, sedatives and other CNS depressants that may worsen sleep apnea and disrupt normal sleep architecture. - Sleep hygiene should be reviewed to assess factors that may improve sleep quality. - Weight management and regular exercise should be initiated or continued if appropriate.   Kara Mead MD Board Certified in [place]

## 2020-04-04 NOTE — Progress Notes (Signed)
DOB and name [facility] F /u  C /o unable to sleep (pt states sleeping 2 hr the most per night) unable to walk for long time due SOB   Pt stated dealing with anxiety and depression/no thoughts of harming herself or others/ Made Md aware  CBG 112 today fasting / readings lowest 94 and 124 highest for the past week

## 2020-04-05 ENCOUNTER — Telehealth: Payer: Self-pay

## 2020-04-05 ENCOUNTER — Other Ambulatory Visit: Payer: Self-pay | Admitting: Critical Care Medicine

## 2020-04-05 DIAGNOSIS — G4733 Obstructive sleep apnea (adult) (pediatric): Secondary | ICD-10-CM

## 2020-04-05 NOTE — Progress Notes (Signed)
cpap orders

## 2020-04-05 NOTE — Telephone Encounter (Signed)
Order received for CPAP.  Call placed to patient # [phone] to inquire if she has a preference for DME companies. Message left with call back requested to this CM

## 2020-04-06 NOTE — Telephone Encounter (Signed)
Call placed to patient again regarding CPAP order.  She preferred the order be sent to [facility] and it was faxed as requested.   She then said that she forgot to tell Dr Joya Gaskins that she has been having pain in the back of her left arm from her shoulder to her elbow.  It started when she was in the hospital and she thought that it was due to  the way she was laying in the bed. She said that the pain went away and and then returned a couple of days ago.  She said that she needs to hold it close to her body when she is moving.  She would like to know if she should have an xray prior to her appointment next week - 04/12/2020.

## 2020-04-07 NOTE — Telephone Encounter (Signed)
I left msg on phone,   No need for xray at this time

## 2020-04-12 ENCOUNTER — Other Ambulatory Visit: Payer: Self-pay

## 2020-04-12 ENCOUNTER — Telehealth: Payer: Self-pay

## 2020-04-12 ENCOUNTER — Ambulatory Visit: Payer: Medicare Other | Attending: Critical Care Medicine | Admitting: Critical Care Medicine

## 2020-04-12 ENCOUNTER — Encounter: Payer: Self-pay | Admitting: Critical Care Medicine

## 2020-04-12 VITALS — BP 123/54 | HR 81 | Temp 97.7°F | Ht 60.0 in | Wt 170.0 lb

## 2020-04-12 DIAGNOSIS — J90 Pleural effusion, not elsewhere classified: Secondary | ICD-10-CM | POA: Diagnosis not present

## 2020-04-12 DIAGNOSIS — I480 Paroxysmal atrial fibrillation: Secondary | ICD-10-CM

## 2020-04-12 DIAGNOSIS — G4733 Obstructive sleep apnea (adult) (pediatric): Secondary | ICD-10-CM

## 2020-04-12 DIAGNOSIS — I10 Essential (primary) hypertension: Secondary | ICD-10-CM

## 2020-04-12 DIAGNOSIS — Z794 Long term (current) use of insulin: Secondary | ICD-10-CM

## 2020-04-12 DIAGNOSIS — E1159 Type 2 diabetes mellitus with other circulatory complications: Secondary | ICD-10-CM | POA: Diagnosis not present

## 2020-04-12 DIAGNOSIS — Z951 Presence of aortocoronary bypass graft: Secondary | ICD-10-CM

## 2020-04-12 MED ORDER — TRAMADOL HCL 50 MG PO TABS: 50.0000 mg | ORAL_TABLET | Freq: Three times a day (TID) | ORAL | 0 refills | Status: DC | PRN

## 2020-04-12 NOTE — Assessment & Plan Note (Signed)
Hypertension in good control will continue metoprolol 12 and half milligrams twice daily and furosemide 20 mg daily  Eliquis is to be maintained twice daily follow-up CBC

## 2020-04-12 NOTE — Telephone Encounter (Signed)
Call placed to [facility] to check on status of CPAP order.  Spoke to Luke who stated that the order has been received and they are in the process of contacting the patient. A message was left for her this morning.    This information was shared with the patient who said she would call [facility] this afternoon

## 2020-04-12 NOTE — Assessment & Plan Note (Addendum)
History of paroxysmal atrial fibrillation now in sinus rhythm with PVCs  Per cardiology  Continue Eliquis follow-up CBC

## 2020-04-12 NOTE — Assessment & Plan Note (Signed)
Sleep apnea diagnosed on recent sleep study  Follow-up with CPAP titration

## 2020-04-12 NOTE — Assessment & Plan Note (Addendum)
Status post coronary bypass graft with 5 vessel disease with coronary artery disease  Continue cholesterol therapy follow-up per cardiology and thoracic surgery  With left shoulder pain we will refill tramadol

## 2020-04-12 NOTE — Assessment & Plan Note (Signed)
Type 2 diabetes with improved control we will continue current program of Jardiance and Metformin

## 2020-04-12 NOTE — Assessment & Plan Note (Signed)
Postop left pleural effusion seen on postop films exam consistent with persistence and left shoulder pain may be referred from diaphragmatic irritation  The patient also appears to be anemic on exam  We will follow-up with chest x-ray to see if pleural effusion is persisting and also obtain complete blood count

## 2020-04-12 NOTE — Patient Instructions (Signed)
A chest xray is ordered  Labs today blood counts and metabolic panel  A CPAP machine has been ordered, they will call you  Refill on tramadol sent to your pharmacy  Return Dr Joya Gaskins 1 month

## 2020-04-12 NOTE — Progress Notes (Signed)
Subjective:    Patient ID: Judy [name], female    DOB: 06/19/1941, 79 y.o.   MRN: [id number] History of Present Illness: 79 y.o.F post hosp for covid.  No pcp.  Hx HTN, OSA, T2DM, Depression, anxiety, Lumbar stenosis, Vit D and B12 deficiency   This is a telephone visit and post hospital visit as well for this patient who was in the hospital for Covid pneumonia between December 31 and January 8.  The patient used to have Dr. Zada Girt is here primary care physician but he just retired at the end of last year.  She is not yet achieved a new primary care physician.  The patient was admitted with hypoxemia and Covid pneumonia.  Since discharge she still does not have any energy has some headaches feels that she has nasal congestion notes some shortness of breath with exertion but no cough.  She has muscle aches but no fever.  She does not have a home pulse oximeter.  Physical therapy came to see her and she is walking better without the cane.  She is not yet gone out of her home.  She can do some ADLs but not many because of her situation.  She does have type 2 diabetes and her blood sugars have been running in the 140 range on 500 mg daily of Metformin.  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  She is wanting a when she can go back to work.  She has home aide coming in giving her baths.  She does not have a method to measure her blood pressure.  The blood pressure was elevated during the hospitalization.  Also she had hyperkalemia during the hospitalization.  She is on Aldactone.  Below is the discharge summary  Admit date: 12/03/2019 Discharge date: 12/11/2019  PCP: Patient, No Pcp Per  DISCHARGE DIAGNOSES:  Acute respiratory failure with hypoxia, resolved Pneumonia due to COVID-19 Chronic kidney disease stage IIIa Diabetes mellitus type 2 uncontrolled with hyperglycemia Essential hypertension History of anxiety  and depression  RECOMMENDATIONS FOR OUTPATIENT FOLLOW UP: 1. Home health    Home Health: PT and OT Equipment/Devices: 3n 1  CODE STATUS: Full code  DISCHARGE CONDITION: fair  Diet recommendation: Modified carbohydrate  INITIAL HISTORY: 79 y.o.femalepast medical history significant for diabetes mellitus type 2, essential hypertension chronic pain presents to the ED with 3 days of shortness of breath the patient reports that she was diagnosed with COVID-19  days ago during this time she been having menorrhagia anorexia with worsening over the next 6 days, and about 4 days prior to admission she started developing shortness of breath fever chills in the ED she was noted to be hypoxic in the 80s placed on 5 L and improved to 91%. She was also found to be hyponatremic hyperkalemic, SARS-CoV-2 PCR was positive chest x-ray showed right-sided infiltrates   HOSPITAL COURSE:   Acute respiratory failure with hypoxia secondarily to pneumonia due to COVID-19: Patient was hospitalized and placed on steroids and remdesivir.  She also received Actemra.  She started improving.  She is weaned down to room air.  She did feel anxious and nervous during this hospitalization.  Patient was reassured.  Her inflammatory markers improved.  Seen by PT and OT.  Home health will be ordered.  Son also lives close by and can provide assistance as well.  Chronic kidney disease stage IIIa/hyperkalemia With a baseline creatinine  1.2-1.5.Renal function has been stable. Hyperkalemia improved with Kayexalate.   Hypovolemic hyponatremia: In the setting of decreased oral intake and diuretic use. Her sodium improved with IV fluid hydration.  Diabetes mellitus type 2 uncontrolled with hyperglycemia/chronic kidney disease stage IIIa: Resume Metformin.HbA1c was 8.1.   Essential HTN (hypertension) Continue home medication  Chronic pain/anxiety/depression: Continue home  medications  Obesity Estimated body mass index is 35 kg/m as calculated from the following: Height as of this encounter: 5' (1.524 m). Weight as of this encounter: 81.3 kg.   Note her hemoglobin A1c was 8.1 during the recent hospitalization and she also had hypokalemia and hyponatremia along with chronic kidney disease   2/9 This is a return visit that is face-to-face from this patient who was in the hospital end of December through 8 January for COVID-19 pneumonia.  The last visit was a telephone visit.  Today she comes into the office and has been weaned off oxygen but still remains quite short of breath with minimal exertion.  She had home physical therapy but that has been discontinued and she is actually fallen twice since that time.  She uses a walker but does not use it consistently.  She does have BiPAP for sleep apnea but only uses it at night.  She does not have a pulse oximeter at home.  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  She notes that her blood sugars tend to be fasting 207 to 150 and postprandial 1 60-1 80  This patient had been on Aldactone but we have held this because of hyperkalemia she does maintain diltiazem daily for blood pressure.  She her primary care provider recently retired and she is going to be seeking a new primary care provider in private practice I did tell her I continue to see her for her pulmonary status  She is on zinc vitamin D and vitamin C supplementation at this time This patient also needs to have her vitamin B12 and vitamin D levels checked   02/09/2020 This is a pleasant 79 year old female seen today in return follow-up for severe Covid pneumonia and chronic hypoxic respiratory failure.  Note her respiratory failure has resolved and she is no longer on oxygen.  She is receiving medication for hypertension at 240 mg daily diltiazem.  Patient also has type 2 diabetes with  chronic kidney disease and recent follow-up being that showed improvement in renal function and she was continued on Metformin and Jardiance.  Note in the interim she is obtained a new primary care provider and is establishing with this physician.  We will share records with this physician as well.  I told the patient today that we would prefer that I manage her pulmonary aspects and let her primary care physician manage the rest of her medical issues.  She still needing a walker to move about and is very wobbly when she ambulates without assistance.  She knows to use the cane more regularly and the walker when needed.  She is continue her exercises from home physical therapy.  And she is going out outpatient physical therapy as well.  She has difficulty with sleeping at night and states the Klonopin is not helping as much and causes her to sleep over to 10:30 in the morning.  She is also on the Tofranil as well.  She is yet to try melatonin.  Also she has an old CPAP machine and is wishing to have  this replaced as the mask is falling apart and the machine is 79 years old.  She will need a follow-up CPAP titration study and mask desensitization and I indicated her I would manage this  Note at the last visit her urine microalbumin was normal she does however need a follow-up A1c and I am assuming her primary care provider will obtain this and the patient was unclear of this therefore we will obtain an A1c at this visit  04/12/2020 Since last OV had admit for CABG and CAD and CVA Below is the discharge summary  Admit date: 02/29/2020 Discharge date: 03/08/2020  Admission Diagnoses:  Patient Active Problem List  Diagnosis Date Noted . Cerebral thrombosis with cerebral infarction 03/02/2020 . NSTEMI (non-ST elevated myocardial infarction) ([place]) 02/29/2020 . Hyperlipidemia associated with type 2 diabetes mellitus ([place]) 02/09/2020 . Type 2 diabetes mellitus, controlled ([place]) 01/12/2020 . Class 2  severe obesity with serious comorbidity and body mass index (BMI) of 35.0 to 35.9 in adult, unspecified obesity type ([place]) 12/25/2018 . Obstructive sleep apnea 11/04/2017 . Lumbar stenosis with neurogenic claudication 11/19/2016 . HTN (hypertension) 03/21/2016 . Anxiety  . Depression    Discharge Diagnoses:   Patient Active Problem List  Diagnosis Date Noted . S/P CABG (coronary artery bypass graft) 03/03/2020 . Cerebral thrombosis with cerebral infarction 03/02/2020 . NSTEMI (non-ST elevated myocardial infarction) ([place]) 02/29/2020 . Hyperlipidemia associated with type 2 diabetes mellitus ([place]) 02/09/2020 . Type 2 diabetes mellitus, controlled ([place]) 01/12/2020 . Class 2 severe obesity with serious comorbidity and body mass index (BMI) of 35.0 to 35.9 in adult, unspecified obesity type ([place]) 12/25/2018 . Obstructive sleep apnea 11/04/2017 . Lumbar stenosis with neurogenic claudication 11/19/2016 . HTN (hypertension) 03/21/2016 . Anxiety  . Depression   Discharged Condition: good  History of Present Illness:  Mrs. [name] is a 79 yo female with DM and Hypertension.  She presented to the hospital with complaints of chest pain.  This was located centrally and radiated under right breast.  This was associated with nausea.  During her workup in the ED she was noted to have ST changes and elevated Troponin levels.  She was placed on a NTG drip with alleviation of her chest pain.  She was admitted for further care.  Hospital Course:   She was taken to the catheterization lab on 03/01/2020.  She was noted to have multivessel CAD with reduced EF 45%.  It was felt coronary bypass grafting would be indicated.  The patient developed confusion and slurred speech.  Code stroke was initiated and she was found to have a small cerebellar stroke.  The patient was evaluated by Dr. Kipp Brood who felt coronary bypass grafting would be indicated.  The risks and benefits of the procedure were  explained to the patient and she was agreeable to proceed.  The patient was stable from her stroke.  It was felt she could proceed to surgery.  She was taken to the operating room and underwent CABG x 5 utilizing LIMA to LAD, SVG to Diagonal, SVG to PLVB, SVG to OM1 and SVG OM 2.  She also underwent endoscopic harvest of greater saphenous vein from right leg.  The patient tolerated the procedure without difficulty and was taken to the SICU in stable condition.  She was extubated the evening of surgery.  During her stay in the SICU the patient did not require inotropic support.  Her arterial line and swan ganz catheter were removed without difficulty.  She had expected post operative blood loss anemia.  Her hemoglobin dropped to 6.7 and she was transfused 1 unit of packed cells.  Her chest tube output decreased and her chest tubes were removed on 03/04/2020.  She has continued to make steady progress.  She has maintained sinus rhythm.  Oxygen has been weaned and she maintains good saturations on room air.  Incisions are noted to be healing well without evidence of infection.  She is tolerating diet without difficulty although has been somewhat slow to progress.  Activity progression is also been somewhat slow but she is making improvements.  She does have a walker at home which she will continue to use to assist with balance.  Renal function is very stable with most recent BUN/creatinine 20/1.14.  Her anemia is very stable at this point.  Most recent hemoglobin hematocrit dated 03/06/2020 are 9.1/27.2 respectively.  At the time of discharge the patient is felt to be quite stable.  Addendum:  Her BP is slowly increasing.  She was started on low dose Cozaar for additional control.  Significant Diagnostic Studies: angiography:    Severe multivessel coronary disease  Segmental proximal to mid 90% LAD stenosis. Distal flow is TIMI grade II. Right coronary supplies LAD via septal perforator collaterals.  90%  mid to distal LAD before the second obtuse marginal. 100% circumflex after the second obtuse marginal.  Total occlusion of distal RCA. Left ventricular branch and PDA fill by collaterals from the left coronary.  Distal left main calcified with 30 to 40% narrowing  Basal inferior to mid inferior wall motion abnormality hypokinetic to akinetic. EF 55%. LVEDP 16 mmHg  Treatments: surgery:   CABG X5.LIMA to LAD, R SVG to OM 2,OM,3 first diagonal and PLV.The OM 3 graft was jumped off the hood of the first diagonal graft Endoscopic greater saphenous vein harvest on theright and left Intra-operative Transesophageal Echocardiogram  Discharge Exam: Blood pressure 131/67, pulse 100, temperature 98.1 F (36.7 C), temperature source Oral, resp. rate 20, height 5' (1.524 m), weight 82.1 kg, SpO2 97 %.  The patient then had a cardiology visit on April 15 Cards visit 4/15; assessment is as below 1. CAD s/p CABG: On Plavix.  Sternotomy scar well-healed.  Otherwise she denies any exertional symptoms since discharge.  She can start on cardiac rehab after she is seen by CV surgery tomorrow.  She has insomnia and loss of appetite since bypass surgery, I suggested she follows up with her PCP.  2. PAF: Continue metoprolol and Eliquis.  3. Obstructive sleep apnea: On CPAP therapy  4. Hypertension: Continue on current therapy.  Obtain CBC and basic metabolic panel  5. Hyperlipidemia: On Lipitor  6. DM2: Managed by primary care provider   Note the patient has good blood pressure control on current medication profile.  Also the patient's blood sugars have been stabilized.  Patient has had a sleep study in the interim showing significant sleep apnea and auto set CPAP has been ordered and is pending.  Recent blood counts showed a tendency towards anemia after open heart surgery.  She also had a chest x-ray showing left pleural effusion.  The patient's greatest complaint today is dyspnea on  exertion and weakness.  The patient also complains of pain in the left shoulder  Past Medical History:  Diagnosis Date  . Anxiety   . Back pain   . Complication of anesthesia 2003   gallbladder surgery-resp. arrest-stop operation and pt. went to ICU - patient stated she has back surgery since then and had no trouble at  all  . Depression   . Diabetes ([place])   . Dyspnea   . Gout   . History of kidney stones   . Hypertension   . Leg edema   . Pancreatitis, chronic ([place]) 03/2016  . Pneumonia due to COVID-19 virus 12/03/2019  . Sleep apnea      Family History  Problem Relation Age of Onset  . Dementia Mother   . AAA (abdominal aortic aneurysm) Father   . Breast cancer Maternal Aunt      Social History   Socioeconomic History  . Marital status: Divorced    Spouse name: Not on file  . Number of children: Not on file  . Years of education: Not on file  . Highest education level: Not on file  Occupational History  . Occupation: CNA  Tobacco Use  . Smoking status: Never Smoker  . Smokeless tobacco: Never Used  Substance and Sexual Activity  . Alcohol use: No    Alcohol/week: 0.0 standard drinks  . Drug use: No  . Sexual activity: Not Currently  Other Topics Concern  . Not on file  Social History Narrative  . Not on file   Social Determinants of Health   Financial Resource Strain:   . Difficulty of Paying Living Expenses:   Food Insecurity:   . Worried About Charity fundraiser in the Last Year:   . Arboriculturist in the Last Year:   Transportation Needs:   . Film/video editor (Medical):   [name] Lack of Transportation (Non-Medical):   Physical Activity:   . Days of Exercise per Week:   . Minutes of Exercise per Session:   Stress:   . Feeling of Stress :   Social Connections:   . Frequency of Communication with Friends and Family:   . Frequency of Social Gatherings with Friends and Family:   . Attends Religious Services:   . Active Member of Clubs or  Organizations:   . Attends Archivist Meetings:   [name] Marital Status:   Intimate Partner Violence:   . Fear of Current or Ex-Partner:   . Emotionally Abused:   [name] Physically Abused:   . Sexually Abused:      No Known Allergies   Outpatient Medications Prior to Visit  Medication Sig Dispense Refill  . apixaban (ELIQUIS) 5 MG TABS tablet Take 1 tablet (5 mg total) by mouth 2 (two) times daily. 60 tablet 0  . atorvastatin (LIPITOR) 20 MG tablet Take 1 tablet (20 mg total) by mouth at bedtime. 90 tablet 2  . empagliflozin (JARDIANCE) 10 MG TABS tablet Take 10 mg by mouth daily. 30 tablet 4  . furosemide (LASIX) 20 MG tablet Take 1 tablet (20 mg total) by mouth daily. 30 tablet 3  . glucose blood (ACCU-CHEK AVIVA) test strip Test twice daily 100 each 2  . imipramine (TOFRANIL) 50 MG tablet Take 1 tablet (50 mg total) by mouth at bedtime. 30 tablet 4  . Lancets (ACCU-CHEK SAFE-T PRO) lancets Test twice daily 100 each 11  . losartan (COZAAR) 25 MG tablet Take 1 tablet (25 mg total) by mouth daily. 30 tablet 3  . metFORMIN (GLUCOPHAGE) 1000 MG tablet Take 1 tablet (1,000 mg total) by mouth 2 (two) times daily with a meal. 60 tablet 4  . metoprolol tartrate (LOPRESSOR) 25 MG tablet Take 0.5 tablets (12.5 mg total) by mouth 2 (two) times daily. 30 tablet 1  . NON FORMULARY 1 each by Other route See  admin instructions. CPAP machine nightly    . nystatin cream (MYCOSTATIN) Apply 1 application topically 2 (two) times daily as needed for dry skin.     [name] traMADol (ULTRAM) 50 MG tablet Take 50-100 mg by mouth every 8 (eight) hours as needed.     No facility-administered medications prior to visit.      Review of Systems Constitutional:     weight loss, night sweats,  Fevers, chills, fatigue, lassitude. HEENT:    headaches,  Difficulty swallowing,  Tooth/dental problems,  Sore throat,                No sneezing, itching, ear ache, nasal congestion, post nasal drip,   CV:  No chest pain,   Orthopnea, PND, swelling in lower extremities, anasarca, dizziness, palpitations  GI  No heartburn, indigestion, abdominal pain, nausea, vomiting, diarrhea, change in bowel habits, loss of appetite  Resp:  shortness of breath with exertion or at rest.  No excess mucus, no productive cough,  No non-productive cough,  No coughing up of blood.  No change in color of mucus.  No wheezing.  No chest wall deformity  Skin: no rash or lesions.  GU: no dysuria, change in color of urine, no urgency or frequency.  No flank pain.  MS:  No joint pain or swelling.  No decreased range of motion.  No back pain.  , there is left shoulder pain  Psych:  No change in mood or affect. No depression or anxiety.  No memory loss.     Objective:   Physical Exam  Vitals:   04/12/20 0956  BP: (!) 123/54  Pulse: 81  Temp: 97.7 F (36.5 C)  TempSrc: Temporal  SpO2: 96%  Weight: 170 lb (77.1 kg)  Height: 5' (1.524 m)    Gen: Pleasant, obese , in no distress,  normal affect  ENT: No lesions,  mouth clear,  oropharynx clear, no postnasal drip  Neck: No JVD, no TMG, no carotid bruits  Lungs: No use of accessory muscles, n decreased breath sounds left lower lobe dullness to percussion one quarter the way up on the left  Cardiovascular: RRR, heart sounds normal, no murmur or gallops, no peripheral edema  Abdomen: soft and NT, no HSM,  BS normal  Musculoskeletal: No deformities, no cyanosis or clubbing No evidence of fracture or deformities of the left shoulder  Neuro: alert, non focal   Skin: Warm, no lesions or rashes   All labs from recent hospitalization are reviewed  Recent shoulder film obtained in the emergency room reviewed was normal  EKG is reviewed at this visit and shows sinus rhythm with frequent premature ventricular contractions prior evidence for inferior wall infarct no acute changes BMP Latest Ref Rng & Units 03/17/2020 03/10/2020 03/06/2020  Glucose 65 - 99 mg/dL 96 149(H) 96  BUN 8  - 27 mg/dL _0 Creatinine 0.57 - 1.00 mg/dL 1.08(H) 1.14(H) 1.14(H)  BUN/Creat Ratio 12 - 28 13 - -  Sodium 134 - 144 mmol/L 138 140 138  Potassium 3.5 - 5.2 mmol/L 4.6 3.2(L) 3.7  Chloride 96 - 106 mmol/L 103 101 105  CO2 20 - 29 mmol/L _1 Calcium 8.7 - 10.3 mg/dL 9.6 9.1 8.5(L)   Hepatic Function Latest Ref Rng & Units 02/29/2020 01/12/2020 12/08/2019  Total Protein 6.5 - 8.1 g/dL 7.1 6.2 5.9(L)  Albumin 3.5 - 5.0 g/dL 4.1 3.8 2.9(L)  AST 15 - 41 U/L 20 11 37  ALT 0 -  44 U/L 23 17 40  Alk Phosphatase 38 - 126 U/L 75 109 61  Total Bilirubin 0.3 - 1.2 mg/dL 0.5 0.2 0.7   CBC Latest Ref Rng & Units 03/17/2020 03/10/2020 03/06/2020  WBC 3.4 - 10.8 x10E3/uL 14.7(H) 12.2(H) 11.5(H)  Hemoglobin 11.1 - 15.9 g/dL 10.7(L) 10.1(L) 9.1(L)  Hematocrit 34.0 - 46.6 % 31.8(L) 31.8(L) 27.2(L)  Platelets 150 - 450 x10E3/uL 552(H) 447(H) 221       Assessment & Plan:  I personally reviewed all images and lab data in the [facility] system as well as any outside material available during this office visit and agree with the  radiology impressions.   Pleural effusion Postop left pleural effusion seen on postop films exam consistent with persistence and left shoulder pain may be referred from diaphragmatic irritation  The patient also appears to be anemic on exam  We will follow-up with chest x-ray to see if pleural effusion is persisting and also obtain complete blood count  Atrial fibrillation (HCC) History of paroxysmal atrial fibrillation now in sinus rhythm with PVCs  Per cardiology  Continue Eliquis follow-up CBC  Type 2 diabetes mellitus, controlled ([place]) Type 2 diabetes with improved control we will continue current program of Jardiance and Metformin  S/P CABG (coronary artery bypass graft) Status post coronary bypass graft with 5 vessel disease with coronary artery disease  Continue cholesterol therapy follow-up per cardiology and thoracic surgery  With left shoulder pain we will  refill tramadol  HTN (hypertension) Hypertension in good control will continue metoprolol 12 and half milligrams twice daily and furosemide 20 mg daily  Eliquis is to be maintained twice daily follow-up CBC  Obstructive sleep apnea Sleep apnea diagnosed on recent sleep study  Follow-up with CPAP titration     Diagnoses and all orders for this visit:  Controlled type 2 diabetes mellitus with other circulatory complication, with long-term current use of insulin ([place]) -     Comprehensive metabolic panel  S/P CABG (coronary artery bypass graft) -     EKG 12-Lead  Pleural effusion -     Comprehensive metabolic panel -     CBC with Differential/Platelet -     DG Chest 2 View; Future  Paroxysmal atrial fibrillation (HCC) -     Comprehensive metabolic panel -     EKG [id number]  Essential hypertension  Obstructive sleep apnea  Other orders -     traMADol (ULTRAM) 50 MG tablet; Take 1-2 tablets (50-100 mg total) by mouth every 8 (eight) hours as needed.   I will send all records to the patient's new primary care provider The patient will continue physical therapy and continue her new diet we will also obtain a home sleep study

## 2020-04-13 ENCOUNTER — Telehealth (HOSPITAL_COMMUNITY): Payer: Self-pay

## 2020-04-13 LAB — CBC WITH DIFFERENTIAL/PLATELET
Basophils Absolute: 0 10*3/uL (ref 0.0–0.2)
Basos: 1 %
EOS (ABSOLUTE): 0.4 10*3/uL (ref 0.0–0.4)
Eos: 4 %
Hematocrit: 38.8 % (ref 34.0–46.6)
Hemoglobin: 12.4 g/dL (ref 11.1–15.9)
Immature Grans (Abs): 0 10*3/uL (ref 0.0–0.1)
Immature Granulocytes: 0 %
Lymphocytes Absolute: 1.8 10*3/uL (ref 0.7–3.1)
Lymphs: 20 %
MCH: 28.5 pg (ref 26.6–33.0)
MCHC: 32 g/dL (ref 31.5–35.7)
MCV: 89 fL (ref 79–97)
Monocytes Absolute: 0.6 10*3/uL (ref 0.1–0.9)
Monocytes: 7 %
Neutrophils Absolute: 6 10*3/uL (ref 1.4–7.0)
Neutrophils: 68 %
Platelets: 317 10*3/uL (ref 150–450)
RBC: 4.35 x10E6/uL (ref 3.77–5.28)
RDW: 13.2 % (ref 11.7–15.4)
WBC: 8.8 10*3/uL (ref 3.4–10.8)

## 2020-04-13 LAB — COMPREHENSIVE METABOLIC PANEL
ALT: 15 IU/L (ref 0–32)
AST: 16 IU/L (ref 0–40)
Albumin/Globulin Ratio: 1.6 (ref 1.2–2.2)
Albumin: 4.2 g/dL (ref 3.7–4.7)
Alkaline Phosphatase: 114 IU/L (ref 39–117)
BUN/Creatinine Ratio: 12 (ref 12–28)
BUN: 15 mg/dL (ref 8–27)
Bilirubin Total: <0.2 mg/dL (ref 0.0–1.2)
CO2: 23 mmol/L (ref 20–29)
Calcium: 9.6 mg/dL (ref 8.7–10.3)
Chloride: 100 mmol/L (ref 96–106)
Creatinine, Ser: 1.25 mg/dL — ABNORMAL HIGH (ref 0.57–1.00)
GFR calc Af Amer: 48 mL/min/{1.73_m2} — ABNORMAL LOW (ref >59)
GFR calc non Af Amer: 42 mL/min/{1.73_m2} — ABNORMAL LOW (ref >59)
Globulin, Total: 2.6 g/dL (ref 1.5–4.5)
Glucose: 105 mg/dL — ABNORMAL HIGH (ref 65–99)
Potassium: 4.7 mmol/L (ref 3.5–5.2)
Sodium: 141 mmol/L (ref 134–144)
Total Protein: 6.8 g/dL (ref 6.0–8.5)

## 2020-04-16 ENCOUNTER — Telehealth (HOSPITAL_COMMUNITY): Payer: Self-pay

## 2020-04-16 NOTE — Telephone Encounter (Signed)
Cardiac Rehab Medication Review by a Pharmacist  Does the patient  feel that his/her medications are working for him/her?  yes  Has the patient been experiencing any side effects to the medications prescribed?  no   Does the patient measure his/her own blood pressure or blood glucose at home?  yes  pt checks blood sugar regularly, usually in 98-120s  Does the patient have any problems obtaining medications due to transportation or finances?   no  Understanding of regimen: Pt does not manage her own medications. Spoke to [place] about her meds.  Understanding of indications: Pt does not manage her own medications. Spoke to [place] about her meds.  Potential of compliance: good    Pharmacist comments: none   Berenice Bouton, PharmD PGY1 Pharmacy Resident  Please check AMION for all [place] phone numbers After 10:00 PM, call [place] [phone]  04/16/2020 3:30 PM

## 2020-04-16 NOTE — Telephone Encounter (Signed)
Cardiac Rehab - Pharmacy Resident Documentation   Patient unable to be reached after three call attempts. Please complete allergy verification and medication review during patient's cardiac rehab appointment.    Berenice Bouton, PharmD PGY1 Pharmacy Resident  Please check AMION for all [place] phone numbers After 10:00 PM, call [place] [phone]

## 2020-04-18 ENCOUNTER — Telehealth (HOSPITAL_COMMUNITY): Payer: Self-pay | Admitting: *Deleted

## 2020-04-18 NOTE — Telephone Encounter (Signed)
To call cardiac rehab regarding upcoming orientation appointment.Barnet Pall, RN,BSN 04/18/2020 10:24 AM

## 2020-04-19 ENCOUNTER — Encounter (HOSPITAL_COMMUNITY): Payer: Self-pay

## 2020-04-19 ENCOUNTER — Encounter (HOSPITAL_COMMUNITY)
Admission: RE | Admit: 2020-04-19 | Discharge: 2020-04-19 | Disposition: A | Discharge status: Home / Self Care | Payer: Medicare Other | Source: Ambulatory Visit | Attending: Cardiovascular Disease | Admitting: Cardiovascular Disease

## 2020-04-19 ENCOUNTER — Institutional Professional Consult (permissible substitution): Payer: Medicare Other | Admitting: Pulmonary Disease

## 2020-04-19 ENCOUNTER — Other Ambulatory Visit: Payer: Self-pay

## 2020-04-19 ENCOUNTER — Telehealth: Payer: Self-pay

## 2020-04-19 VITALS — BP 104/62 | HR 92 | Resp 18 | Ht 60.0 in | Wt 166.9 lb

## 2020-04-19 DIAGNOSIS — Z951 Presence of aortocoronary bypass graft: Secondary | ICD-10-CM

## 2020-04-19 DIAGNOSIS — I214 Non-ST elevation (NSTEMI) myocardial infarction: Secondary | ICD-10-CM

## 2020-04-19 NOTE — Progress Notes (Signed)
Cardiac Individual Treatment Plan  Patient Details  Name: Judy [name] MRN: [id number] Date of Birth: January 14, 1941 Referring Provider:     CARDIAC REHAB PHASE II ORIENTATION from 04/19/2020 in [facility]  Referring Provider  O'Neal, Cassie Freer, MD      Initial Encounter Date:    CARDIAC REHAB PHASE II ORIENTATION from 04/19/2020 in [facility]  Date  04/19/20      Visit Diagnosis: S/P CABG x 5  NSTEMI (non-ST elevated myocardial infarction) ([place])  Patient's Home Medications on Admission:  Current Outpatient Medications:  .  apixaban (ELIQUIS) 5 MG TABS tablet, Take 1 tablet (5 mg total) by mouth 2 (two) times daily., Disp: 60 tablet, Rfl: 0 .  atorvastatin (LIPITOR) 20 MG tablet, Take 1 tablet (20 mg total) by mouth at bedtime., Disp: 90 tablet, Rfl: 2 .  empagliflozin (JARDIANCE) 10 MG TABS tablet, Take 10 mg by mouth daily., Disp: 30 tablet, Rfl: 4 .  furosemide (LASIX) 20 MG tablet, Take 1 tablet (20 mg total) by mouth daily., Disp: 30 tablet, Rfl: 3 .  glucose blood (ACCU-CHEK AVIVA) test strip, Test twice daily, Disp: 100 each, Rfl: 2 .  imipramine (TOFRANIL) 50 MG tablet, Take 1 tablet (50 mg total) by mouth at bedtime., Disp: 30 tablet, Rfl: 4 .  Lancets (ACCU-CHEK SAFE-T PRO) lancets, Test twice daily, Disp: 100 each, Rfl: 11 .  losartan (COZAAR) 25 MG tablet, Take 1 tablet (25 mg total) by mouth daily., Disp: 30 tablet, Rfl: 3 .  metFORMIN (GLUCOPHAGE) 1000 MG tablet, Take 1 tablet (1,000 mg total) by mouth 2 (two) times daily with a meal., Disp: 60 tablet, Rfl: 4 .  metoprolol tartrate (LOPRESSOR) 25 MG tablet, Take 0.5 tablets (12.5 mg total) by mouth 2 (two) times daily., Disp: 30 tablet, Rfl: 1 .  NON FORMULARY, 1 each by Other route See admin instructions. CPAP machine nightly, Disp: , Rfl:  .  nystatin cream (MYCOSTATIN), Apply 1 application topically 2 (two) times daily as needed for dry skin. , Disp: ,  Rfl:  .  traMADol (ULTRAM) 50 MG tablet, Take 1-2 tablets (50-100 mg total) by mouth every 8 (eight) hours as needed., Disp: 30 tablet, Rfl: 0  Past Medical History: Past Medical History:  Diagnosis Date  . Anxiety   . Back pain   . Complication of anesthesia 2003   gallbladder surgery-resp. arrest-stop operation and pt. went to ICU - patient stated she has back surgery since then and had no trouble at all  . Depression   . Diabetes ([place])   . Dyspnea   . Gout   . History of kidney stones   . Hypertension   . Leg edema   . Pancreatitis, chronic ([place]) 03/2016  . Pneumonia due to COVID-19 virus 12/03/2019  . Sleep apnea     Tobacco Use: Social History   Tobacco Use  Smoking Status Never Smoker  Smokeless Tobacco Never Used    Labs: Recent Review Flowsheet Data    Labs for ITP Cardiac and Pulmonary Rehab Latest Ref Rng & Units 03/03/2020 03/03/2020 03/03/2020 03/03/2020 03/03/2020   Cholestrol 0 - 200 mg/dL - - - - -   LDLCALC 0 - 99 mg/dL - - - - -   HDL >40 mg/dL - - - - -   Trlycerides <150 mg/dL - - - - -   Hemoglobin A1c 4.8 - 5.6 % - - - - -   PHART 7.350 -  7.450 7.406 - 7.344(L) 7.320(L) 7.305(L)   PCO2ART 32.0 - 48.0 mmHg 41.5 - 40.9 37.0 38.9   HCO3 20.0 - 28.0 mmol/L 26.0 - 22.4 19.2(L) 19.4(L)   TCO2 22 - 32 mmol/L 27 30 24  20(L) 21(L)   ACIDBASEDEF 0.0 - 2.0 mmol/L - - 3.0(H) 6.0(H) 6.0(H)   O2SAT % 100.0 - 89.0 96.0 93.0      Capillary Blood Glucose: Lab Results  Component Value Date   GLUCAP 119 (H) 03/05/2020   GLUCAP 113 (H) 03/05/2020   GLUCAP 104 (H) 03/05/2020   GLUCAP 130 (H) 03/04/2020   GLUCAP 122 (H) 03/04/2020     Exercise Target Goals: Exercise Program Goal: Individual exercise prescription set using results from initial 6 min walk test and THRR while considering  patient's activity barriers and safety.   Exercise Prescription Goal: Initial exercise prescription builds to 30-45 minutes a day of aerobic activity, 2-3 days per week.  Home  exercise guidelines will be given to patient during program as part of exercise prescription that the participant will acknowledge.  Activity Barriers & Risk Stratification: Activity Barriers & Cardiac Risk Stratification - 04/19/20 1420      Activity Barriers & Cardiac Risk Stratification   Activity Barriers  Assistive Device;Other (comment);Balance Concerns;History of Falls    Comments  Occassional left shoulder/arm pain, unknown etiology.    Cardiac Risk Stratification  High       6 Minute Walk: 6 Minute Walk    Row Name 04/19/20 1430         6 Minute Walk   Phase  Initial     Distance  806 feet     Walk Time  6 minutes     # of Rest Breaks  2     MPH  1.53     METS  1.33     RPE  13     Perceived Dyspnea   3     VO2 Peak  4.66     Symptoms  Yes (comment)     Comments  Two seated rest breaks secondary to SOB. Patient c/o burning, bilateral hip discomfort radiating down thigh.     Resting HR  92 bpm     Resting BP  104/62     Resting Oxygen Saturation   96 %     Exercise Oxygen Saturation  during 6 min walk  97 %     Max Ex. HR  106 bpm     Max Ex. BP  130/62     2 Minute Post BP  116/50        Oxygen Initial Assessment:   Oxygen Re-Evaluation:   Oxygen Discharge (Final Oxygen Re-Evaluation):   Initial Exercise Prescription: Initial Exercise Prescription - 04/19/20 1500      Date of Initial Exercise RX and Referring Provider   Date  04/19/20    Referring Provider  Geralynn Rile, MD    Expected Discharge Date  06/20/20      T5 Nustep   Level  1    SPM  85    Minutes  15    METs  1.9      Track   Laps  8    Minutes  15    METs  1.93      Prescription Details   Frequency (times per week)  3    Duration  Progress to 30 minutes of continuous aerobic without signs/symptoms of physical distress      Intensity  THRR 40-80% of Max Heartrate  57-114    Ratings of Perceived Exertion  11-13    Perceived Dyspnea  0-4      Progression    Progression  Continue to progress workloads to maintain intensity without signs/symptoms of physical distress.      Resistance Training   Training Prescription  Yes    Weight  2lbs    Reps  10-15       Perform Capillary Blood Glucose checks as needed.  Exercise Prescription Changes:   Exercise Comments:   Exercise Goals and Review:  Exercise Goals    Row Name 04/19/20 1421             Exercise Goals   Increase Physical Activity  Yes       Intervention  Provide advice, education, support and counseling about physical activity/exercise needs.;Develop an individualized exercise prescription for aerobic and resistive training based on initial evaluation findings, risk stratification, comorbidities and participant's personal goals.       Expected Outcomes  Short Term: Attend rehab on a regular basis to increase amount of physical activity.;Long Term: Exercising regularly at least 3-5 days a week.;Long Term: Add in home exercise to make exercise part of routine and to increase amount of physical activity.       Increase Strength and Stamina  Yes       Intervention  Provide advice, education, support and counseling about physical activity/exercise needs.;Develop an individualized exercise prescription for aerobic and resistive training based on initial evaluation findings, risk stratification, comorbidities and participant's personal goals.       Expected Outcomes  Short Term: Increase workloads from initial exercise prescription for resistance, speed, and METs.;Short Term: Perform resistance training exercises routinely during rehab and add in resistance training at home;Long Term: Improve cardiorespiratory fitness, muscular endurance and strength as measured by increased METs and functional capacity (6MWT)       Able to understand and use rate of perceived exertion (RPE) scale  Yes       Intervention  Provide education and explanation on how to use RPE scale       Expected Outcomes   Short Term: Able to use RPE daily in rehab to express subjective intensity level;Long Term:  Able to use RPE to guide intensity level when exercising independently       Knowledge and understanding of Target Heart Rate Range (THRR)  Yes       Intervention  Provide education and explanation of THRR including how the numbers were predicted and where they are located for reference       Expected Outcomes  Short Term: Able to state/look up THRR;Long Term: Able to use THRR to govern intensity when exercising independently;Short Term: Able to use daily as guideline for intensity in rehab       Able to check pulse independently  Yes       Intervention  Provide education and demonstration on how to check pulse in carotid and radial arteries.;Review the importance of being able to check your own pulse for safety during independent exercise       Expected Outcomes  Short Term: Able to explain why pulse checking is important during independent exercise;Long Term: Able to check pulse independently and accurately       Understanding of Exercise Prescription  Yes       Intervention  Provide education, explanation, and written materials on patient's individual exercise prescription       Expected Outcomes  Short  Term: Able to explain program exercise prescription;Long Term: Able to explain home exercise prescription to exercise independently          Exercise Goals Re-Evaluation :   Discharge Exercise Prescription (Final Exercise Prescription Changes):   Nutrition:  Target Goals: Understanding of nutrition guidelines, daily intake of sodium 1500mg , cholesterol 200mg , calories 30% from fat and 7% or less from saturated fats, daily to have 5 or more servings of fruits and vegetables.  Biometrics: Pre Biometrics - 04/19/20 1424      Pre Biometrics   Waist Circumference  37.75 inches    Hip Circumference  45 inches    Waist to Hip Ratio  0.84 %    Triceps Skinfold  29.5 mm    % Body Fat  44.3 %    Grip  Strength  22.5 kg    Flexibility  0 in    Single Leg Stand  5.31 seconds        Nutrition Therapy Plan and Nutrition Goals:   Nutrition Assessments:   Nutrition Goals Re-Evaluation:   Nutrition Goals Re-Evaluation:   Nutrition Goals Discharge (Final Nutrition Goals Re-Evaluation):   Psychosocial: Target Goals: Acknowledge presence or absence of significant depression and/or stress, maximize coping skills, provide positive support system. Participant is able to verbalize types and ability to use techniques and skills needed for reducing stress and depression.  Initial Review & Psychosocial Screening: Initial Psych Review & Screening - 04/19/20 1611      Initial Review   Current issues with  History of Depression      Family Dynamics   Good Support System?  Yes    Comments  Ms. Newnan denies psychosocial barriers to self health management or needs. 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 She has since had a stroke, MI, and CABG x 5. Prior to her Covid dx, her son was not involved in her medical care. He is now taking initiative along with a "close friend" who brought her to her appointment today. She lives with a roommate who is also supportive. She enjoys sewing as a hobbie. PHQ9 score 5 however patient states this does not prevent her from completing ADLs or having meaningful relationships. No interventions needed at this time.      Barriers   Psychosocial barriers to participate in program  There are no identifiable barriers or psychosocial needs.      Screening Interventions   Interventions  Encouraged to exercise;To provide support and resources with identified psychosocial needs;Provide feedback about the scores to participant       Quality of Life Scores:  Scores of 19 and below usually indicate a poorer quality of life in these areas.  A difference of  2-3 points is a clinically meaningful difference.  A difference of  2-3 points in the total score of the Quality of Life Index has been associated with significant improvement in overall quality of life, self-image, physical symptoms, and general health in studies assessing change in quality of life.  PHQ-9: Recent Review Flowsheet Data    Depression screen [facility] 2/9 04/19/2020 04/04/2020 03/24/2020 03/18/2020 02/09/2020   Decreased Interest 1 - 1 1 2    Down, Depressed, Hopeless 1 - 1 1 1    PHQ - 2 Score 2 - 2 2 3    Altered sleeping 1 - 1 1 1    Tired, decreased energy 1 - 1 1 2    Change in appetite 1 - 1 1  2   Feeling bad or failure about yourself  0 - 0 0 0   Trouble concentrating 0 - 0 0 1   Moving slowly or fidgety/restless 0 - 0 0 0   Suicidal thoughts 0 0 0 0 0   PHQ-9 Score 5 - 5 5 9    Difficult doing work/chores Not difficult at all - Somewhat difficult Somewhat difficult -     Interpretation of Total Score  Total Score Depression Severity:  1-4 = Minimal depression, 5-9 = Mild depression, 10-14 = Moderate depression, 15-19 = Moderately severe depression, 20-27 = Severe depression   Psychosocial Evaluation and Intervention:   Psychosocial Re-Evaluation:   Psychosocial Discharge (Final Psychosocial Re-Evaluation):   Vocational Rehabilitation: Provide vocational rehab assistance to qualifying candidates.   Vocational Rehab Evaluation & Intervention:   Education: Education Goals: Education classes will be provided on a weekly basis, covering required topics. Participant will state understanding/return demonstration of topics presented.  Learning Barriers/Preferences:   Education Topics: Count Your Pulse:  -Group instruction provided by verbal instruction, demonstration, patient participation and written materials to support subject.  Instructors address importance of being able to find your pulse and how to count your pulse when at home without a heart monitor.  Patients get hands on experience counting their pulse with staff help and  individually.   Heart Attack, Angina, and Risk Factor Modification:  -Group instruction provided by verbal instruction, video, and written materials to support subject.  Instructors address signs and symptoms of angina and heart attacks.    Also discuss risk factors for heart disease and how to make changes to improve heart health risk factors.   Functional Fitness:  -Group instruction provided by verbal instruction, demonstration, patient participation, and written materials to support subject.  Instructors address safety measures for doing things around the house.  Discuss how to get up and down off the floor, how to pick things up properly, how to safely get out of a chair without assistance, and balance training.   Meditation and Mindfulness:  -Group instruction provided by verbal instruction, patient participation, and written materials to support subject.  Instructor addresses importance of mindfulness and meditation practice to help reduce stress and improve awareness.  Instructor also leads participants through a meditation exercise.    Stretching for Flexibility and Mobility:  -Group instruction provided by verbal instruction, patient participation, and written materials to support subject.  Instructors lead participants through series of stretches that are designed to increase flexibility thus improving mobility.  These stretches are additional exercise for major muscle groups that are typically performed during regular warm up and cool down.   Hands Only CPR:  -Group verbal, video, and participation provides a basic overview of AHA guidelines for community CPR. Role-play of emergencies allow participants the opportunity to practice calling for help and chest compression technique with discussion of AED use.   Hypertension: -Group verbal and written instruction that provides a basic overview of hypertension including the most recent diagnostic guidelines, risk factor reduction with  self-care instructions and medication management.    Nutrition I class: Heart Healthy Eating:  -Group instruction provided by PowerPoint slides, verbal discussion, and written materials to support subject matter. The instructor gives an explanation and review of the Therapeutic Lifestyle Changes diet recommendations, which includes a discussion on lipid goals, dietary fat, sodium, fiber, plant stanol/sterol esters, sugar, and the components of a well-balanced, healthy diet.   Nutrition II class: Lifestyle Skills:  -Group instruction provided by PowerPoint slides, verbal  discussion, and written materials to support subject matter. The instructor gives an explanation and review of label reading, grocery shopping for heart health, heart healthy recipe modifications, and ways to make healthier choices when eating out.   Diabetes Question & Answer:  -Group instruction provided by PowerPoint slides, verbal discussion, and written materials to support subject matter. The instructor gives an explanation and review of diabetes co-morbidities, pre- and post-prandial blood glucose goals, pre-exercise blood glucose goals, signs, symptoms, and treatment of hypoglycemia and hyperglycemia, and foot care basics.   Diabetes Blitz:  -Group instruction provided by PowerPoint slides, verbal discussion, and written materials to support subject matter. The instructor gives an explanation and review of the physiology behind type 1 and type 2 diabetes, diabetes medications and rational behind using different medications, pre- and post-prandial blood glucose recommendations and Hemoglobin A1c goals, diabetes diet, and exercise including blood glucose guidelines for exercising safely.    Portion Distortion:  -Group instruction provided by PowerPoint slides, verbal discussion, written materials, and food models to support subject matter. The instructor gives an explanation of serving size versus portion size, changes in  portions sizes over the last 20 years, and what consists of a serving from each food group.   Stress Management:  -Group instruction provided by verbal instruction, video, and written materials to support subject matter.  Instructors review role of stress in heart disease and how to cope with stress positively.     Exercising on Your Own:  -Group instruction provided by verbal instruction, power point, and written materials to support subject.  Instructors discuss benefits of exercise, components of exercise, frequency and intensity of exercise, and end points for exercise.  Also discuss use of nitroglycerin and activating EMS.  Review options of places to exercise outside of rehab.  Review guidelines for sex with heart disease.   Cardiac Drugs I:  -Group instruction provided by verbal instruction and written materials to support subject.  Instructor reviews cardiac drug classes: antiplatelets, anticoagulants, beta blockers, and statins.  Instructor discusses reasons, side effects, and lifestyle considerations for each drug class.   Cardiac Drugs II:  -Group instruction provided by verbal instruction and written materials to support subject.  Instructor reviews cardiac drug classes: angiotensin converting enzyme inhibitors (ACE-I), angiotensin II receptor blockers (ARBs), nitrates, and calcium channel blockers.  Instructor discusses reasons, side effects, and lifestyle considerations for each drug class.   Anatomy and Physiology of the Circulatory System:  Group verbal and written instruction and models provide basic cardiac anatomy and physiology, with the coronary electrical and arterial systems. Review of: AMI, Angina, Valve disease, Heart Failure, Peripheral Artery Disease, Cardiac Arrhythmia, Pacemakers, and the ICD.   Other Education:  -Group or individual verbal, written, or video instructions that support the educational goals of the cardiac rehab program.   Holiday Eating Survival  Tips:  -Group instruction provided by PowerPoint slides, verbal discussion, and written materials to support subject matter. The instructor gives patients tips, tricks, and techniques to help them not only survive but enjoy the holidays despite the onslaught of food that accompanies the holidays.   Knowledge Questionnaire Score: Knowledge Questionnaire Score - 04/19/20 1604      Knowledge Questionnaire Score   Pre Score  19/24       Core Components/Risk Factors/Patient Goals at Admission: Personal Goals and Risk Factors at Admission - 04/19/20 1605      Core Components/Risk Factors/Patient Goals on Admission    Weight Management  Yes;Obesity    Intervention  Weight  Management/Obesity: Establish reasonable short term and long term weight goals.;Obesity: Provide education and appropriate resources to help participant work on and attain dietary goals.    Admit Weight  166 lb 14.2 oz (75.7 kg)    Expected Outcomes  Short Term: Continue to assess and modify interventions until short term weight is achieved;Long Term: Adherence to nutrition and physical activity/exercise program aimed toward attainment of established weight goal;Weight Loss: Understanding of general recommendations for a balanced deficit meal plan, which promotes 1-2 lb weight loss per week and includes a negative energy balance of [phone] kcal/d;Understanding recommendations for meals to include 15-35% energy as protein, 25-35% energy from fat, 35-60% energy from carbohydrates, less than 200mg  of dietary cholesterol, 20-35 gm of total fiber daily;Understanding of distribution of calorie intake throughout the day with the consumption of 4-5 meals/snacks    Diabetes  Yes    Intervention  Provide education about signs/symptoms and action to take for hypo/hyperglycemia.;Provide education about proper nutrition, including hydration, and aerobic/resistive exercise prescription along with prescribed medications to achieve blood glucose in  normal ranges: Fasting glucose 65-99 mg/dL    Expected Outcomes  Short Term: Participant verbalizes understanding of the signs/symptoms and immediate care of hyper/hypoglycemia, proper foot care and importance of medication, aerobic/resistive exercise and nutrition plan for blood glucose control.;Long Term: Attainment of HbA1C < 7%.    Hypertension  Yes    Intervention  Provide education on lifestyle modifcations including regular physical activity/exercise, weight management, moderate sodium restriction and increased consumption of fresh fruit, vegetables, and low fat dairy, alcohol moderation, and smoking cessation.;Monitor prescription use compliance.    Expected Outcomes  Short Term: Continued assessment and intervention until BP is < 140/22mm HG in hypertensive participants. < 130/72mm HG in hypertensive participants with diabetes, heart failure or chronic kidney disease.;Long Term: Maintenance of blood pressure at goal levels.    Lipids  Yes    Intervention  Provide education and support for participant on nutrition & aerobic/resistive exercise along with prescribed medications to achieve LDL 70mg , HDL >40mg .    Expected Outcomes  Short Term: Participant states understanding of desired cholesterol values and is compliant with medications prescribed. Participant is following exercise prescription and nutrition guidelines.;Long Term: Cholesterol controlled with medications as prescribed, with individualized exercise RX and with personalized nutrition plan. Value goals: LDL < 70mg , HDL > 40 mg.       Core Components/Risk Factors/Patient Goals Review:    Core Components/Risk Factors/Patient Goals at Discharge (Final Review):    ITP Comments: ITP Comments    Row Name 04/19/20 1409           ITP Comments  Dr. Fransico Him Medical Director Cardiac Re[facility]          Comments: Patient attended orientation on 04/19/2020 to review rules and guidelines for program.  Completed 6 minute  walk test, Intitial ITP, and exercise prescription.  VSS. Telemetry-NSR with frequent PACs/PVCs. No change from last EKG. Patient c/o shortness of breath and required rest breaks during walk test. SOB resolved with rest. Safety measures and social distancing in place per CDC guidelines.

## 2020-04-19 NOTE — Telephone Encounter (Signed)
Call placed to [facility] to check on status of CPAP order.  Spoke to [place] who said that the CPAP machine has been shipped to the patient.

## 2020-04-20 ENCOUNTER — Other Ambulatory Visit: Payer: Self-pay | Admitting: Thoracic Surgery (Cardiothoracic Vascular Surgery)

## 2020-04-20 DIAGNOSIS — J90 Pleural effusion, not elsewhere classified: Secondary | ICD-10-CM

## 2020-04-21 ENCOUNTER — Encounter: Payer: Self-pay | Admitting: Thoracic Surgery (Cardiothoracic Vascular Surgery)

## 2020-04-21 ENCOUNTER — Ambulatory Visit (INDEPENDENT_AMBULATORY_CARE_PROVIDER_SITE_OTHER): Payer: Self-pay | Admitting: Thoracic Surgery (Cardiothoracic Vascular Surgery)

## 2020-04-21 ENCOUNTER — Other Ambulatory Visit: Payer: Self-pay

## 2020-04-21 ENCOUNTER — Ambulatory Visit
Admission: RE | Admit: 2020-04-21 | Discharge: 2020-04-21 | Disposition: A | Discharge status: Home / Self Care | Payer: Medicare Other | Source: Ambulatory Visit | Attending: Thoracic Surgery (Cardiothoracic Vascular Surgery) | Admitting: Thoracic Surgery (Cardiothoracic Vascular Surgery)

## 2020-04-21 VITALS — BP 129/83 | HR 80 | Temp 97.6°F | Resp 20 | Ht 60.0 in | Wt 169.0 lb

## 2020-04-21 DIAGNOSIS — Z951 Presence of aortocoronary bypass graft: Secondary | ICD-10-CM

## 2020-04-21 DIAGNOSIS — J90 Pleural effusion, not elsewhere classified: Secondary | ICD-10-CM

## 2020-04-21 NOTE — Progress Notes (Signed)
      [address]       [place] [address]             [phone]        Natalia H Tippy [facility] Record [id number] Date of Birth: 09/10/1941  Referring: Elouise Munroe, MD Primary Care: Michael Boston, MD Primary Cardiologist:Waldron Doreatha Lew, MD  Reason for visit:   follow-up  History of Present Illness:     79 year old female that presents for second follow-up appointment following CABG.  She is doing very well since being home.  She only complains of some insomnia.  She also has some chronic shoulder pain.  Physical Exam: BP 129/83   Pulse 80   Temp 97.6 F (36.4 C) (Skin)   Resp 20   Ht 5' (1.524 m)   Wt 169 lb (76.7 kg)   SpO2 97% Comment: RA  BMI 33.01 kg/m   Alert NAD Incision clean.  Sternum stable Abdomen soft, ND No peripheral edema   Diagnostic Studies & Laboratory data: CXR: Clear     Assessment / Plan:   79 year old female status post CABG doing well. Return to clinic as needed Cleared for cardiac rehab   Lajuana Matte 04/21/2020 3:37 PM

## 2020-04-22 ENCOUNTER — Encounter: Payer: Medicare Other | Admitting: Thoracic Surgery (Cardiothoracic Vascular Surgery)

## 2020-04-27 ENCOUNTER — Other Ambulatory Visit: Payer: Self-pay

## 2020-04-27 ENCOUNTER — Encounter (HOSPITAL_COMMUNITY)
Admission: RE | Admit: 2020-04-27 | Discharge: 2020-04-27 | Disposition: A | Discharge status: Home / Self Care | Payer: Medicare Other | Source: Ambulatory Visit | Attending: Cardiovascular Disease | Admitting: Cardiovascular Disease

## 2020-04-27 DIAGNOSIS — I214 Non-ST elevation (NSTEMI) myocardial infarction: Secondary | ICD-10-CM

## 2020-04-27 DIAGNOSIS — Z951 Presence of aortocoronary bypass graft: Secondary | ICD-10-CM

## 2020-04-27 LAB — GLUCOSE, CAPILLARY
Glucose-Capillary: 162 mg/dL — ABNORMAL HIGH (ref 70–99)
Glucose-Capillary: 116 mg/dL — ABNORMAL HIGH (ref 70–99)

## 2020-04-27 NOTE — Progress Notes (Signed)
Daily Session Note  Patient Details  Name: Judy [name] MRN: [id number] Date of Birth: [birth year] Referring Provider:     CARDIAC REHAB PHASE II ORIENTATION from 04/19/2020 in [facility]  Referring Provider  O'Neal, Cassie Freer, MD      Encounter Date: 04/27/2020  Check In: Session Check In - 04/27/20 1338      Check-In   Supervising physician immediately available to respond to emergencies  [facility] immediately available    Physician(s)  Dr. Broadus John    Location  [facility]    Staff Present  Dorma Russell, MS,ACSM CEP, Exercise Physiologist;Olinty Celesta Aver, MS, ACSM CEP, Exercise Physiologist;Nayelis Bonito Venetia Maxon, RN, Roque Cash, RN    Virtual Visit  No    Medication changes reported      No    Fall or balance concerns reported     No    Tobacco Cessation  No Change    Warm-up and Cool-down  Performed on first and last piece of equipment    Resistance Training Performed  No    VAD Patient?  No    PAD/SET Patient?  No      Pain Assessment   Currently in Pain?  No/denies    Pain Score  0-No pain    Multiple Pain Sites  No       Capillary Blood Glucose: Results for orders placed or performed during the hospital encounter of 04/27/20 (from the past 24 hour(s))  Glucose, capillary     Status: Abnormal   Collection Time: 04/27/20  2:29 PM  Result Value Ref Range   Glucose-Capillary 116 (H) 70 - 99 mg/dL    Exercise Prescription Changes - 04/27/20 1341      Response to Exercise   Blood Pressure (Admit)  94/42    Blood Pressure (Exercise)  138/72    Blood Pressure (Exit)  122/48    Heart Rate (Admit)  70 bpm    Heart Rate (Exercise)  116 bpm    Heart Rate (Exit)  69 bpm    Oxygen Saturation (Exercise)  98 %    Rating of Perceived Exertion (Exercise)  14    Perceived Dyspnea (Exercise)  1    Symptoms  Patient c/o mild SOB walking the track.    Comments  First exercise session tolerated fair w/ some SOB.    Duration  Progress to 30 minutes of  aerobic without signs/symptoms of physical distress    Intensity  THRR unchanged      Progression   Progression  Continue to progress workloads to maintain intensity without signs/symptoms of physical distress.    Average METs  1.8      Resistance Training   Training Prescription  No      Interval Training   Interval Training  No      T5 Nustep   Level  1    SPM  85    Minutes  15    METs  1.8      Track   Laps  8    Minutes  15    METs  1.93       Social History   Tobacco Use  Smoking Status Never Smoker  Smokeless Tobacco Never Used    Goals Met:  No report of cardiac concerns or symptoms  Goals Unmet:  Not Applicable  Comments: Pt started cardiac rehab today.  Pt tolerated light exercise without difficulty. VSS, telemetry-sinus rhythm occasional PVC's, asymptomatic.  Medication  list reconciled. Pt denies barriers to medicaiton compliance.  PSYCHOSOCIAL ASSESSMENT:  PHQ-5. Pt exhibits positive coping skills, hopeful outlook with supportive family. Keandra is currently on an antidepressant and says that her depression is currently under control    Pt enjoys sewing.   Pt oriented to exercise equipment and routine.    Understanding verbalized.Barnet Pall, RN,BSN 04/27/2020 4:46 PM   Dr. Fransico Him is Medical Director for Cardiac Rehab at [facility].

## 2020-04-28 ENCOUNTER — Other Ambulatory Visit: Payer: Self-pay | Admitting: Surgical

## 2020-04-29 ENCOUNTER — Other Ambulatory Visit: Payer: Self-pay

## 2020-04-29 ENCOUNTER — Encounter (HOSPITAL_COMMUNITY)
Admission: RE | Admit: 2020-04-29 | Discharge: 2020-04-29 | Disposition: A | Discharge status: Home / Self Care | Payer: Medicare Other | Source: Ambulatory Visit | Attending: Cardiovascular Disease | Admitting: Cardiovascular Disease

## 2020-04-29 DIAGNOSIS — Z951 Presence of aortocoronary bypass graft: Secondary | ICD-10-CM

## 2020-04-29 DIAGNOSIS — I214 Non-ST elevation (NSTEMI) myocardial infarction: Secondary | ICD-10-CM

## 2020-04-29 LAB — GLUCOSE, CAPILLARY
Glucose-Capillary: 173 mg/dL — ABNORMAL HIGH (ref 70–99)
Glucose-Capillary: 135 mg/dL — ABNORMAL HIGH (ref 70–99)

## 2020-05-01 NOTE — Progress Notes (Signed)
Cardiology Office Note:   Date:  05/04/2020  NAME:  Judy [name]    MRN: [id number] DOB:  09/21/1941   PCP:  Michael Boston, MD  Cardiologist:  Evalina Field, MD   Referring MD: Michael Boston, MD   Chief Complaint  Patient presents with  . Coronary Artery Disease   History of Present Illness:   Judy [name] is a 79 y.o. female with a hx of CAD s/p CABG, DM, CVA, Afib who presents for follow-up NSTEMI 02/2020 and had 3v CAD. Underwent CABG x 5 (details below). Had post op afib a few days after discharge that spontaneously converted back to NSR. On plavix eliquis. Her EKG today demonstrates normal sinus rhythm with PVCs.  She seems to be doing well.  She was taken off Lasix and has some increased lower extremity edema.  She does present with her friend who reports that she is been having memory issues and possibly not communicating things to providers that she should.  Apparently her son is not been involved in her life.  That is problematic as she has undergone open heart surgery recently.  She is participating cardiac rehab.  She is done third session and reports is going well.  I have encouraged her to continue with this.  This will be very important for her to recover from her heart surgery.  She is placed back on Plavix appropriately.  She remains on Eliquis 5 mg twice daily.  Her blood pressure is well controlled today.  Her sternotomy scar is well-healed.  Her chest x-ray shows no pleural effusion.  She denies chest pain, shortness of breath or palpitations in office.  She does report trouble sleeping and was prescribed trazodone.  It is not helping her.  She will reach back out to her primary care physician.  Problem List 1. CAD/NSTEMI s/p CABG -02/2020 with NSTEMI and had 3v CAD -03/03/2020: CABG x 5; LIMA-LAD, SVG to OM2, OM3, D1, PLV (OM3 jump graft off D1) 2. HTN 3. DM -A1c 7.1 -T chol 89, HDL 39, LDL 30, TG 101 4. CVA -post cath complication  5. OSA 6. Afib with RVR -developed  after CABG 03/11/2020 -spontaneously converted back to NSR 7. Carotid Artery stenosis -1-39% bilatearlly   Past Medical History: Past Medical History:  Diagnosis Date  . Anxiety   . Back pain   . Complication of anesthesia 2003   gallbladder surgery-resp. arrest-stop operation and pt. went to ICU - patient stated she has back surgery since then and had no trouble at all  . Depression   . Diabetes ([place])   . Dyspnea   . Gout   . History of kidney stones   . Hypertension   . Leg edema   . Pancreatitis, chronic ([place]) 03/2016  . Pneumonia due to COVID-19 virus 12/03/2019  . Sleep apnea     Past Surgical History: Past Surgical History:  Procedure Laterality Date  . BACK SURGERY    . BREAST LUMPECTOMY WITH RADIOACTIVE SEED LOCALIZATION Right 10/23/2019   Procedure: RIGHT BREAST LUMPECTOMY WITH RADIOACTIVE SEED LOCALIZATION;  Surgeon: Jovita Kussmaul, MD;  Location: [facility];  Service: General;  Laterality: Right;  . CHOLECYSTECTOMY    . COLONOSCOPY  2013  . CORONARY ARTERY BYPASS GRAFT N/A 03/03/2020   Procedure: CORONARY ARTERY BYPASS GRAFTING (CABG), ON PUMP, TIMES FIVE, USING LEFT INTERNAL MAMMARY ARTERY AND ENDOSCOPICALLY HARVESTED BILATERAL GREATER SAPHENOUS VEINS -flow track only;  Surgeon: Lajuana Matte, MD;  Location: [facility]  OR;  Service: Open Heart Surgery;  Laterality: N/A;  . LEFT HEART CATH AND CORONARY ANGIOGRAPHY N/A 03/01/2020   Procedure: LEFT HEART CATH AND CORONARY ANGIOGRAPHY;  Surgeon: Belva Crome, MD;  Location: [facility];  Service: Cardiovascular;  Laterality: N/A;  . TEE WITHOUT CARDIOVERSION N/A 03/03/2020   Procedure: TRANSESOPHAGEAL ECHOCARDIOGRAM (TEE);  Surgeon: Lajuana Matte, MD;  Location: [facility];  Service: Open Heart Surgery;  Laterality: N/A;    Current Medications: No outpatient medications have been marked as taking for the 05/04/20 encounter (Office Visit) with Geralynn Rile, MD.     Allergies:    Patient has no known allergies.    Social History: Social History   Socioeconomic History  . Marital status: Divorced    Spouse name: Not on file  . Number of children: Not on file  . Years of education: Not on file  . Highest education level: Not on file  Occupational History  . Occupation: CNA  Tobacco Use  . Smoking status: Never Smoker  . Smokeless tobacco: Never Used  Substance and Sexual Activity  . Alcohol use: No    Alcohol/week: 0.0 standard drinks  . Drug use: No  . Sexual activity: Not Currently  Other Topics Concern  . Not on file  Social History Narrative  . Not on file   Social Determinants of Health   Financial Resource Strain:   . Difficulty of Paying Living Expenses:   Food Insecurity:   . Worried About Charity fundraiser in the Last Year:   . Arboriculturist in the Last Year:   Transportation Needs:   . Film/video editor (Medical):   [name] Lack of Transportation (Non-Medical):   Physical Activity:   . Days of Exercise per Week:   . Minutes of Exercise per Session:   Stress:   . Feeling of Stress :   Social Connections:   . Frequency of Communication with Friends and Family:   . Frequency of Social Gatherings with Friends and Family:   . Attends Religious Services:   . Active Member of Clubs or Organizations:   . Attends Archivist Meetings:   [name] Marital Status:      Family History: The patient's family history includes AAA (abdominal aortic aneurysm) in her father; Breast cancer in her maternal aunt; Dementia in her mother.  ROS:   All other ROS reviewed and negative. Pertinent positives noted in the HPI.     EKGs/Labs/Other Studies Reviewed:   The following studies were personally reviewed by me today:  EKG:  EKG is ordered today.  The ekg ordered today demonstrates normal sinus rhythm heart rate 83, PVCs noted, and was personally reviewed by me.   TTE 02/29/2020 1. Left ventricular ejection fraction, by estimation, is 45 to 50%. The  left ventricle has  mildly decreased function. The left ventricle  demonstrates regional wall motion abnormalities (see scoring  diagram/findings for description). Inferior  hypokinesis. There is mild left ventricular hypertrophy. Left ventricular  diastolic parameters are indeterminate.  2. Right ventricular systolic function is normal. The right ventricular  size is normal.  3. The mitral valve is normal in structure. Trivial mitral valve  regurgitation.  4. The aortic valve was not well visualized. Aortic valve regurgitation  is not visualized. No aortic stenosis is present.   Carotid US Summary:  Right Carotid: Velocities in the right ICA are consistent with a 1-39%  stenosis.   Left Carotid: Velocities in the  left ICA are consistent with a 1-39%  stenosis.  Vertebrals: Bilateral vertebral arteries demonstrate antegrade flow.  Subclavians: Normal flow hemodynamics were seen in bilateral subclavian        arteries.   Recent Labs: 03/04/2020: Magnesium 2.4 04/12/2020: ALT 15; BUN 15; Creatinine, Ser 1.25; Hemoglobin 12.4; Platelets 317; Potassium 4.7; Sodium 141   Recent Lipid Panel    Component Value Date/Time   CHOL 89 03/01/2020 0351   CHOL 184 06/19/2018 0958   TRIG 101 03/01/2020 0351   HDL 39 (L) 03/01/2020 0351   HDL 45 06/19/2018 0958   CHOLHDL 2.3 03/01/2020 0351   VLDL 20 03/01/2020 0351   LDLCALC 30 03/01/2020 0351   LDLCALC 107 (H) 06/19/2018 0958    Physical Exam:   VS:  BP (!) 142/78   Pulse (!) 56   Temp (!) 97 F (36.1 C)   Ht 5' (1.524 m)   Wt 172 lb 12.8 oz (78.4 kg)   SpO2 97%   BMI 33.75 kg/m    Wt Readings from Last 3 Encounters:  05/04/20 172 lb 12.8 oz (78.4 kg)  04/21/20 169 lb (76.7 kg)  04/19/20 166 lb 14.2 oz (75.7 kg)    General: Well nourished, well developed, in no acute distress Heart: Atraumatic, normal size  Eyes: PEERLA, EOMI  Neck: Supple, no JVD Endocrine: No thryomegaly Cardiac: Normal S1, S2; RRR; no murmurs, rubs, or  gallops Lungs: Clear to auscultation bilaterally, no wheezing, rhonchi or rales  Abd: Soft, nontender, no hepatomegaly  Ext: No edema, pulses 2+ Musculoskeletal: No deformities, BUE and BLE strength normal and equal Skin: Warm and dry, no rashes   Neuro: Alert and oriented to person, place, time, and situation, CNII-XII grossly intact, no focal deficits  Psych: Normal mood and affect   ASSESSMENT:   VISION KITTLER is a 80 y.o. female who presents for the following: 1. Coronary artery disease involving coronary bypass graft of native heart without angina pectoris   2. Mixed hyperlipidemia   3. Essential hypertension   4. PAF (paroxysmal atrial fibrillation) ([place])   5. Leg edema     PLAN:   1. Coronary artery disease involving coronary bypass graft of native heart without angina pectoris -02/2020 with NSTEMI and had 3v CAD -03/03/2020: CABG x 5; LIMA-LAD, SVG to OM2, OM3, D1, PLV (OM3 jump graft off D1) -on plavix due to NSTEMI and will continue for at least 1 year. She needs cataract surgery and we can hold for that after 6 months. I discussed this with her.  -LVEF 45-50% with inferior WMA. Will switch to metoprolol succinate at next visit. Continue losartan 25 mg QD.  -reports some LE edema; will restart lasix 20 mg QD -continue statin -she will see me back in 3 months   2. Mixed hyperlipidemia -LDL 30; continue lipitor 20 mg QD  3. Essential hypertension -acceptable today  4. PAF (paroxysmal atrial fibrillation) ([place]) -will continue eliquis for now. Likely post op as occurred 8 days after CABG. May stop eliquis at next visit.   5. Leg edema -lasix 20 mg QD   Disposition: Return in about 3 months (around 08/04/2020).  Medication Adjustments/Labs and Tests Ordered: Current medicines are reviewed at length with the patient today.  Concerns regarding medicines are outlined above.  No orders of the defined types were placed in this encounter.  Meds ordered this encounter   Medications  . furosemide (LASIX) 20 MG tablet    Sig: Take 1 tablet (20 mg total)  by mouth daily.    Dispense:  90 tablet    Refill:  3  . clopidogrel (PLAVIX) 75 MG tablet    Sig: Take 1 tablet (75 mg total) by mouth daily.    Dispense:  90 tablet    Refill:  3  . atorvastatin (LIPITOR) 20 MG tablet    Sig: Take 1 tablet (20 mg total) by mouth at bedtime.    Dispense:  90 tablet    Refill:  2  . metoprolol tartrate (LOPRESSOR) 25 MG tablet    Sig: Take 0.5 tablets (12.5 mg total) by mouth 2 (two) times daily.    Dispense:  30 tablet    Refill:  1  . apixaban (ELIQUIS) 5 MG TABS tablet    Sig: Take 1 tablet (5 mg total) by mouth 2 (two) times daily.    Dispense:  60 tablet    Refill:  0    Patient Instructions  Medication Instructions:  Start Lasix 20 mg daily  Start Plavix 75 mg daily  Continue all other medications.   *If you need a refill on your cardiac medications before your next appointment, please call your pharmacy*   Follow-Up: At [facility], you and your health needs are our priority.  As part of our continuing mission to provide you with exceptional heart care, we have created designated Provider Care Teams.  These Care Teams include your primary Cardiologist (physician) and Advanced Practice Providers (APPs -  Physician Assistants and Nurse Practitioners) who all work together to provide you with the care you need, when you need it.  We recommend signing up for the patient portal called "MyChart".  Sign up information is provided on this After Visit Summary.  MyChart is used to connect with patients for Virtual Visits (Telemedicine).  Patients are able to view lab/test results, encounter notes, upcoming appointments, etc.  Non-urgent messages can be sent to your provider as well.   To learn more about what you can do with MyChart, go to [email or web address].    Your next appointment:   3 month(s)  The format for your next appointment:   In  Person  Provider:   Eleonore Chiquito, MD   Other Instructions      Time Spent with Patient: I have spent a total of 25 minutes with patient reviewing hospital notes, telemetry, EKGs, labs and examining the patient as well as establishing an assessment and plan that was discussed with the patient.  > 50% of time was spent in direct patient care.  Signed, Addison Naegeli. Audie Box, [place]  [address] [place], [place] [address] [phone]  05/04/2020 3:26 PM

## 2020-05-04 ENCOUNTER — Other Ambulatory Visit: Payer: Self-pay

## 2020-05-04 ENCOUNTER — Ambulatory Visit: Payer: Medicare Other | Admitting: Cardiovascular Disease

## 2020-05-04 ENCOUNTER — Telehealth: Payer: Self-pay

## 2020-05-04 ENCOUNTER — Encounter: Payer: Self-pay | Admitting: Cardiovascular Disease

## 2020-05-04 ENCOUNTER — Encounter (HOSPITAL_COMMUNITY): Payer: Self-pay

## 2020-05-04 VITALS — BP 142/78 | HR 56 | Temp 97.0°F | Ht 60.0 in | Wt 172.8 lb

## 2020-05-04 DIAGNOSIS — Z951 Presence of aortocoronary bypass graft: Secondary | ICD-10-CM

## 2020-05-04 DIAGNOSIS — I25810 Atherosclerosis of coronary artery bypass graft(s) without angina pectoris: Secondary | ICD-10-CM | POA: Diagnosis not present

## 2020-05-04 DIAGNOSIS — I214 Non-ST elevation (NSTEMI) myocardial infarction: Secondary | ICD-10-CM

## 2020-05-04 DIAGNOSIS — I10 Essential (primary) hypertension: Secondary | ICD-10-CM | POA: Diagnosis not present

## 2020-05-04 DIAGNOSIS — I480 Paroxysmal atrial fibrillation: Secondary | ICD-10-CM

## 2020-05-04 DIAGNOSIS — R600 Localized edema: Secondary | ICD-10-CM

## 2020-05-04 DIAGNOSIS — E782 Mixed hyperlipidemia: Secondary | ICD-10-CM | POA: Diagnosis not present

## 2020-05-04 MED ORDER — METOPROLOL TARTRATE 25 MG PO TABS: 12.5000 mg | ORAL_TABLET | Freq: Two times a day (BID) | ORAL | 1 refills | Status: DC

## 2020-05-04 MED ORDER — APIXABAN 5 MG PO TABS: 5.0000 mg | ORAL_TABLET | Freq: Two times a day (BID) | ORAL | 0 refills | Status: DC

## 2020-05-04 MED ORDER — CLOPIDOGREL BISULFATE 75 MG PO TABS
75.0000 mg | ORAL_TABLET | Freq: Every day | ORAL | 3 refills | Status: DC
Start: 2020-05-04 — End: 2021-02-23

## 2020-05-04 MED ORDER — FUROSEMIDE 20 MG PO TABS
20.0000 mg | ORAL_TABLET | Freq: Every day | ORAL | 3 refills | Status: DC
Start: 2020-05-04 — End: 2021-05-03

## 2020-05-04 MED ORDER — ATORVASTATIN CALCIUM 20 MG PO TABS: 20.0000 mg | ORAL_TABLET | Freq: Every day | ORAL | 2 refills | Status: AC

## 2020-05-04 NOTE — Progress Notes (Signed)
Cardiac Individual Treatment Plan  Patient Details  Name: Judy [name] MRN: [id number] Date of Birth: 09-19-1941 Referring Provider:     CARDIAC REHAB PHASE II ORIENTATION from 04/19/2020 in [facility]  Referring Provider  O'Neal, Cassie Freer, MD      Initial Encounter Date:    CARDIAC REHAB PHASE II ORIENTATION from 04/19/2020 in [facility]  Date  04/19/20      Visit Diagnosis: S/P CABG x 5  NSTEMI (non-ST elevated myocardial infarction) ([place])  Patient's Home Medications on Admission:  Current Outpatient Medications:  .  apixaban (ELIQUIS) 5 MG TABS tablet, Take 1 tablet (5 mg total) by mouth 2 (two) times daily., Disp: 60 tablet, Rfl: 0 .  atorvastatin (LIPITOR) 20 MG tablet, Take 1 tablet (20 mg total) by mouth at bedtime., Disp: 90 tablet, Rfl: 2 .  clopidogrel (PLAVIX) 75 MG tablet, Take 1 tablet (75 mg total) by mouth daily., Disp: 90 tablet, Rfl: 3 .  empagliflozin (JARDIANCE) 10 MG TABS tablet, Take 10 mg by mouth daily., Disp: 30 tablet, Rfl: 4 .  furosemide (LASIX) 20 MG tablet, Take 1 tablet (20 mg total) by mouth daily., Disp: 90 tablet, Rfl: 3 .  glucose blood (ACCU-CHEK AVIVA) test strip, Test twice daily, Disp: 100 each, Rfl: 2 .  Lancets (ACCU-CHEK SAFE-T PRO) lancets, Test twice daily, Disp: 100 each, Rfl: 11 .  losartan (COZAAR) 25 MG tablet, Take 1 tablet (25 mg total) by mouth daily., Disp: 30 tablet, Rfl: 3 .  metFORMIN (GLUCOPHAGE) 1000 MG tablet, Take 1 tablet (1,000 mg total) by mouth 2 (two) times daily with a meal., Disp: 60 tablet, Rfl: 4 .  metoprolol tartrate (LOPRESSOR) 25 MG tablet, Take 0.5 tablets (12.5 mg total) by mouth 2 (two) times daily., Disp: 30 tablet, Rfl: 1 .  NON FORMULARY, 1 each by Other route See admin instructions. CPAP machine nightly, Disp: , Rfl:  .  nystatin cream (MYCOSTATIN), Apply 1 application topically 2 (two) times daily as needed for dry skin. , Disp: , Rfl:   .  sertraline (ZOLOFT) 100 MG tablet, Take 100 mg by mouth daily., Disp: , Rfl:  .  traMADol (ULTRAM) 50 MG tablet, Take 1-2 tablets (50-100 mg total) by mouth every 8 (eight) hours as needed., Disp: 30 tablet, Rfl: 0 .  traZODone (DESYREL) 100 MG tablet, Take 100 mg by mouth at bedtime as needed., Disp: , Rfl:   Past Medical History: Past Medical History:  Diagnosis Date  . Anxiety   . Back pain   . Complication of anesthesia 2003   gallbladder surgery-resp. arrest-stop operation and pt. went to ICU - patient stated she has back surgery since then and had no trouble at all  . Depression   . Diabetes ([place])   . Dyspnea   . Gout   . History of kidney stones   . Hypertension   . Leg edema   . Pancreatitis, chronic ([place]) 03/2016  . Pneumonia due to COVID-19 virus 12/03/2019  . Sleep apnea     Tobacco Use: Social History   Tobacco Use  Smoking Status Never Smoker  Smokeless Tobacco Never Used    Labs: Recent Review Flowsheet Data    Labs for ITP Cardiac and Pulmonary Rehab Latest Ref Rng & Units 03/03/2020 03/03/2020 03/03/2020 03/03/2020 03/03/2020   Cholestrol 0 - 200 mg/dL - - - - -   LDLCALC 0 - 99 mg/dL - - - - -  HDL >40 mg/dL - - - - -   Trlycerides <150 mg/dL - - - - -   Hemoglobin A1c 4.8 - 5.6 % - - - - -   PHART 7.350 - 7.450 7.406 - 7.344(L) 7.320(L) 7.305(L)   PCO2ART 32.0 - 48.0 mmHg 41.5 - 40.9 37.0 38.9   HCO3 20.0 - 28.0 mmol/L 26.0 - 22.4 19.2(L) 19.4(L)   TCO2 22 - 32 mmol/L 27 30 24  20(L) 21(L)   ACIDBASEDEF 0.0 - 2.0 mmol/L - - 3.0(H) 6.0(H) 6.0(H)   O2SAT % 100.0 - 89.0 96.0 93.0      Capillary Blood Glucose: Lab Results  Component Value Date   GLUCAP 135 (H) 04/29/2020   GLUCAP 173 (H) 04/29/2020   GLUCAP 116 (H) 04/27/2020   GLUCAP 162 (H) 04/27/2020   GLUCAP 119 (H) 03/05/2020     Exercise Target Goals: Exercise Program Goal: Individual exercise prescription set using results from initial 6 min walk test and THRR while considering  patient's  activity barriers and safety.   Exercise Prescription Goal: Initial exercise prescription builds to 30-45 minutes a day of aerobic activity, 2-3 days per week.  Home exercise guidelines will be given to patient during program as part of exercise prescription that the participant will acknowledge.  Activity Barriers & Risk Stratification: Activity Barriers & Cardiac Risk Stratification - 04/19/20 1420      Activity Barriers & Cardiac Risk Stratification   Activity Barriers  Assistive Device;Other (comment);Balance Concerns;History of Falls    Comments  Occassional left shoulder/arm pain, unknown etiology.    Cardiac Risk Stratification  High       6 Minute Walk: 6 Minute Walk    Row Name 04/19/20 1430         6 Minute Walk   Phase  Initial     Distance  806 feet     Walk Time  6 minutes     # of Rest Breaks  2     MPH  1.53     METS  1.33     RPE  13     Perceived Dyspnea   3     VO2 Peak  4.66     Symptoms  Yes (comment)     Comments  Two seated rest breaks secondary to SOB. Patient c/o burning, bilateral hip discomfort radiating down thigh.     Resting HR  92 bpm     Resting BP  104/62     Resting Oxygen Saturation   96 %     Exercise Oxygen Saturation  during 6 min walk  97 %     Max Ex. HR  106 bpm     Max Ex. BP  130/62     2 Minute Post BP  116/50        Oxygen Initial Assessment:   Oxygen Re-Evaluation:   Oxygen Discharge (Final Oxygen Re-Evaluation):   Initial Exercise Prescription: Initial Exercise Prescription - 04/19/20 1500      Date of Initial Exercise RX and Referring Provider   Date  04/19/20    Referring Provider  O'Neal, Cassie Freer, MD    Expected Discharge Date  06/20/20      T5 Nustep   Level  1    SPM  85    Minutes  15    METs  1.9      Track   Laps  8    Minutes  15    METs  1.93  Prescription Details   Frequency (times per week)  3    Duration  Progress to 30 minutes of continuous aerobic without signs/symptoms of  physical distress      Intensity   THRR 40-80% of Max Heartrate  57-114    Ratings of Perceived Exertion  11-13    Perceived Dyspnea  0-4      Progression   Progression  Continue to progress workloads to maintain intensity without signs/symptoms of physical distress.      Resistance Training   Training Prescription  Yes    Weight  2lbs    Reps  10-15       Perform Capillary Blood Glucose checks as needed.  Exercise Prescription Changes:  Exercise Prescription Changes    Row Name 04/27/20 1341 04/29/20 1347           Response to Exercise   Blood Pressure (Admit)  94/42  114/72      Blood Pressure (Exercise)  138/72  116/60      Blood Pressure (Exit)  122/48  124/78      Heart Rate (Admit)  70 bpm  94 bpm      Heart Rate (Exercise)  116 bpm  124 bpm      Heart Rate (Exit)  69 bpm  68 bpm      Oxygen Saturation (Exercise)  98 %  --      Rating of Perceived Exertion (Exercise)  14  12      Perceived Dyspnea (Exercise)  1  0      Symptoms  Patient c/o mild SOB walking the track.  --      Comments  First exercise session tolerated fair w/ some SOB.  --      Duration  Progress to 30 minutes of  aerobic without signs/symptoms of physical distress  Progress to 30 minutes of  aerobic without signs/symptoms of physical distress      Intensity  THRR unchanged  THRR unchanged        Progression   Progression  Continue to progress workloads to maintain intensity without signs/symptoms of physical distress.  Continue to progress workloads to maintain intensity without signs/symptoms of physical distress.      Average METs  1.8  2.1        Resistance Training   Training Prescription  No  Yes      Weight  --  2lbs      Reps  --  10-15      Time  --  10 Minutes        Interval Training   Interval Training  No  No        T5 Nustep   Level  1  1      SPM  85  85      Minutes  15  15      METs  1.8  1.8        Track   Laps  8  9      Minutes  15  15      METs  1.93  2.04          Exercise Comments:  Exercise Comments    Row Name 04/27/20 1430 05/04/20 1641         Exercise Comments  Patient tolerated low intensity exercise fair with some SOB. Multiple seated rest breaks taken during walking station.  Patient absent today, unable to review METs and goals.  Exercise Goals and Review:  Exercise Goals    Row Name 04/19/20 1421             Exercise Goals   Increase Physical Activity  Yes       Intervention  Provide advice, education, support and counseling about physical activity/exercise needs.;Develop an individualized exercise prescription for aerobic and resistive training based on initial evaluation findings, risk stratification, comorbidities and participant's personal goals.       Expected Outcomes  Short Term: Attend rehab on a regular basis to increase amount of physical activity.;Long Term: Exercising regularly at least 3-5 days a week.;Long Term: Add in home exercise to make exercise part of routine and to increase amount of physical activity.       Increase Strength and Stamina  Yes       Intervention  Provide advice, education, support and counseling about physical activity/exercise needs.;Develop an individualized exercise prescription for aerobic and resistive training based on initial evaluation findings, risk stratification, comorbidities and participant's personal goals.       Expected Outcomes  Short Term: Increase workloads from initial exercise prescription for resistance, speed, and METs.;Short Term: Perform resistance training exercises routinely during rehab and add in resistance training at home;Long Term: Improve cardiorespiratory fitness, muscular endurance and strength as measured by increased METs and functional capacity (6MWT)       Able to understand and use rate of perceived exertion (RPE) scale  Yes       Intervention  Provide education and explanation on how to use RPE scale       Expected Outcomes  Short Term: Able to  use RPE daily in rehab to express subjective intensity level;Long Term:  Able to use RPE to guide intensity level when exercising independently       Knowledge and understanding of Target Heart Rate Range (THRR)  Yes       Intervention  Provide education and explanation of THRR including how the numbers were predicted and where they are located for reference       Expected Outcomes  Short Term: Able to state/look up THRR;Long Term: Able to use THRR to govern intensity when exercising independently;Short Term: Able to use daily as guideline for intensity in rehab       Able to check pulse independently  Yes       Intervention  Provide education and demonstration on how to check pulse in carotid and radial arteries.;Review the importance of being able to check your own pulse for safety during independent exercise       Expected Outcomes  Short Term: Able to explain why pulse checking is important during independent exercise;Long Term: Able to check pulse independently and accurately       Understanding of Exercise Prescription  Yes       Intervention  Provide education, explanation, and written materials on patient's individual exercise prescription       Expected Outcomes  Short Term: Able to explain program exercise prescription;Long Term: Able to explain home exercise prescription to exercise independently          Exercise Goals Re-Evaluation : Exercise Goals Re-Evaluation    Row Name 04/27/20 1430 05/04/20 1641           Exercise Goal Re-Evaluation   Exercise Goals Review  Increase Physical Activity;Able to understand and use rate of perceived exertion (RPE) scale  Increase Physical Activity;Able to understand and use rate of perceived exertion (RPE) scale      Comments  Patient able to understand and use RPE scale appropriately.  Patient absent today, unable to review METs and goals.      Expected Outcomes  Progress workloads as tolerated to help improve cardiorepsiratory fitness.  Will  review upon return to exercise.         Discharge Exercise Prescription (Final Exercise Prescription Changes): Exercise Prescription Changes - 04/29/20 1347      Response to Exercise   Blood Pressure (Admit)  114/72    Blood Pressure (Exercise)  116/60    Blood Pressure (Exit)  124/78    Heart Rate (Admit)  94 bpm    Heart Rate (Exercise)  124 bpm    Heart Rate (Exit)  68 bpm    Oxygen Saturation (Exercise)  --    Rating of Perceived Exertion (Exercise)  12    Perceived Dyspnea (Exercise)  0    Symptoms  --    Comments  --    Duration  Progress to 30 minutes of  aerobic without signs/symptoms of physical distress    Intensity  THRR unchanged      Progression   Progression  Continue to progress workloads to maintain intensity without signs/symptoms of physical distress.    Average METs  2.1      Resistance Training   Training Prescription  Yes    Weight  2lbs    Reps  10-15    Time  10 Minutes      Interval Training   Interval Training  No      T5 Nustep   Level  1    SPM  85    Minutes  15    METs  1.8      Track   Laps  9    Minutes  15    METs  2.04       Nutrition:  Target Goals: Understanding of nutrition guidelines, daily intake of sodium 1500mg , cholesterol 200mg , calories 30% from fat and 7% or less from saturated fats, daily to have 5 or more servings of fruits and vegetables.  Biometrics: Pre Biometrics - 04/19/20 1424      Pre Biometrics   Waist Circumference  37.75 inches    Hip Circumference  45 inches    Waist to Hip Ratio  0.84 %    Triceps Skinfold  29.5 mm    % Body Fat  44.3 %    Grip Strength  22.5 kg    Flexibility  0 in    Single Leg Stand  5.31 seconds        Nutrition Therapy Plan and Nutrition Goals:   Nutrition Assessments: Nutrition Assessments - 04/22/20 1013      MEDFICTS Scores   Pre Score  77       Nutrition Goals Re-Evaluation:   Nutrition Goals Re-Evaluation:   Nutrition Goals Discharge (Final  Nutrition Goals Re-Evaluation):   Psychosocial: Target Goals: Acknowledge presence or absence of significant depression and/or stress, maximize coping skills, provide positive support system. Participant is able to verbalize types and ability to use techniques and skills needed for reducing stress and depression.  Initial Review & Psychosocial Screening: Initial Psych Review & Screening - 04/19/20 1611      Initial Review   Current issues with  History of Depression      Family Dynamics   Good Support System?  Yes    Comments  Judy [name] denies psychosocial barriers to self health management or needs. 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 She has since had a stroke, MI, and CABG x 5. Prior to her Covid dx, her son was not involved in her medical care. He is now taking initiative along with a "close friend" who brought her to her appointment today. She lives with a roommate who is also supportive. She enjoys sewing as a hobbie. PHQ9 score 5 however patient states this does not prevent her from completing ADLs or having meaningful relationships. No interventions needed at this time.      Barriers   Psychosocial barriers to participate in program  There are no identifiable barriers or psychosocial needs.      Screening Interventions   Interventions  Encouraged to exercise;To provide support and resources with identified psychosocial needs;Provide feedback about the scores to participant       Quality of Life Scores: Quality of Life - 04/27/20 1547      Quality of Life   Select  Quality of Life      Quality of Life Scores   Health/Function Pre  18.5 %    Socioeconomic Pre  17.25 %    Psych/Spiritual Pre  19.5 %    Family Pre  28.5 %    GLOBAL Pre  19.66 %      Scores of 19 and below usually indicate a poorer quality of life in these areas.  A difference of  2-3 points is a clinically meaningful difference.  A difference of 2-3 points  in the total score of the Quality of Life Index has been associated with significant improvement in overall quality of life, self-image, physical symptoms, and general health in studies assessing change in quality of life.  PHQ-9: Recent Review Flowsheet Data    Depression screen [facility] 2/9 04/19/2020 04/04/2020 03/24/2020 03/18/2020 02/09/2020   Decreased Interest 1 - 1 1 2    Down, Depressed, Hopeless 1 - 1 1 1    PHQ - 2 Score 2 - 2 2 3    Altered sleeping 1 - 1 1 1    Tired, decreased energy 1 - 1 1 2    Change in appetite 1 - 1 1 2    Feeling bad or failure about yourself  0 - 0 0 0   Trouble concentrating 0 - 0 0 1   Moving slowly or fidgety/restless 0 - 0 0 0   Suicidal thoughts 0 0 0 0 0   PHQ-9 Score 5 - 5 5 9    Difficult doing work/chores Not difficult at all - Somewhat difficult Somewhat difficult -     Interpretation of Total Score  Total Score Depression Severity:  1-4 = Minimal depression, 5-9 = Mild depression, 10-14 = Moderate depression, 15-19 = Moderately severe depression, 20-27 = Severe depression   Psychosocial Evaluation and Intervention: Psychosocial Evaluation - 05/04/20 1521      Psychosocial Evaluation & Interventions   Interventions  Encouraged to exercise with the program and follow exercise prescription    Continue Psychosocial Services   --       Psychosocial Re-Evaluation: Psychosocial Re-Evaluation    [place] Name 05/04/20 1522             Psychosocial Re-Evaluation   Current issues with  History of Depression       Comments  Judy [name] denies psychosocial barriers to self health management or needs. 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 She has since had a stroke,  MI, and CABG x 5. Prior to her Covid dx, her son was not involved in her medical care. He is now taking initiative along with a "close friend" who brought her to her appointment today. She lives with a roommate who is also supportive. She enjoys sewing as  a hobbie. PHQ9 score 5 however patient states this does not prevent her from completing ADLs or having meaningful relationships. No interventions needed at this time.       Expected Outcomes  Judy [name] will continue to have a positive attitude and outlook. She will report improved PHQ 9 score by end of CR enrollment.       Interventions  Encouraged to attend Cardiac Rehabilitation for the exercise       Continue Psychosocial Services   Follow up required by staff          Psychosocial Discharge (Final Psychosocial Re-Evaluation): Psychosocial Re-Evaluation - 05/04/20 1522      Psychosocial Re-Evaluation   Current issues with  History of Depression    Comments  Judy [name] denies psychosocial barriers to self health management or needs. 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 She has since had a stroke, MI, and CABG x 5. Prior to her Covid dx, her son was not involved in her medical care. He is now taking initiative along with a "close friend" who brought her to her appointment today. She lives with a roommate who is also supportive. She enjoys sewing as a hobbie. PHQ9 score 5 however patient states this does not prevent her from completing ADLs or having meaningful relationships. No interventions needed at this time.    Expected Outcomes  Judy [name] will continue to have a positive attitude and outlook. She will report improved PHQ 9 score by end of CR enrollment.    Interventions  Encouraged to attend Cardiac Rehabilitation for the exercise    Continue Psychosocial Services   Follow up required by staff       Vocational Rehabilitation: Provide vocational rehab assistance to qualifying candidates.   Vocational Rehab Evaluation & Intervention:   Education: Education Goals: Education classes will be provided on a weekly basis, covering required topics. Participant will state understanding/return demonstration of topics presented.  Learning  Barriers/Preferences:   Education Topics: Count Your Pulse:  -Group instruction provided by verbal instruction, demonstration, patient participation and written materials to support subject.  Instructors address importance of being able to find your pulse and how to count your pulse when at home without a heart monitor.  Patients get hands on experience counting their pulse with staff help and individually.   Heart Attack, Angina, and Risk Factor Modification:  -Group instruction provided by verbal instruction, video, and written materials to support subject.  Instructors address signs and symptoms of angina and heart attacks.    Also discuss risk factors for heart disease and how to make changes to improve heart health risk factors.   Functional Fitness:  -Group instruction provided by verbal instruction, demonstration, patient participation, and written materials to support subject.  Instructors address safety measures for doing things around the house.  Discuss how to get up and down off the floor, how to pick things up properly, how to safely get out of a chair without assistance, and balance training.   Meditation and Mindfulness:  -Group instruction provided by verbal instruction, patient participation, and written materials to support subject.  Instructor addresses importance of mindfulness and meditation  practice to help reduce stress and improve awareness.  Instructor also leads participants through a meditation exercise.    Stretching for Flexibility and Mobility:  -Group instruction provided by verbal instruction, patient participation, and written materials to support subject.  Instructors lead participants through series of stretches that are designed to increase flexibility thus improving mobility.  These stretches are additional exercise for major muscle groups that are typically performed during regular warm up and cool down.   Hands Only CPR:  -Group verbal, video, and  participation provides a basic overview of AHA guidelines for community CPR. Role-play of emergencies allow participants the opportunity to practice calling for help and chest compression technique with discussion of AED use.   Hypertension: -Group verbal and written instruction that provides a basic overview of hypertension including the most recent diagnostic guidelines, risk factor reduction with self-care instructions and medication management.    Nutrition I class: Heart Healthy Eating:  -Group instruction provided by PowerPoint slides, verbal discussion, and written materials to support subject matter. The instructor gives an explanation and review of the Therapeutic Lifestyle Changes diet recommendations, which includes a discussion on lipid goals, dietary fat, sodium, fiber, plant stanol/sterol esters, sugar, and the components of a well-balanced, healthy diet.   Nutrition II class: Lifestyle Skills:  -Group instruction provided by PowerPoint slides, verbal discussion, and written materials to support subject matter. The instructor gives an explanation and review of label reading, grocery shopping for heart health, heart healthy recipe modifications, and ways to make healthier choices when eating out.   Diabetes Question & Answer:  -Group instruction provided by PowerPoint slides, verbal discussion, and written materials to support subject matter. The instructor gives an explanation and review of diabetes co-morbidities, pre- and post-prandial blood glucose goals, pre-exercise blood glucose goals, signs, symptoms, and treatment of hypoglycemia and hyperglycemia, and foot care basics.   Diabetes Blitz:  -Group instruction provided by PowerPoint slides, verbal discussion, and written materials to support subject matter. The instructor gives an explanation and review of the physiology behind type 1 and type 2 diabetes, diabetes medications and rational behind using different medications,  pre- and post-prandial blood glucose recommendations and Hemoglobin A1c goals, diabetes diet, and exercise including blood glucose guidelines for exercising safely.    Portion Distortion:  -Group instruction provided by PowerPoint slides, verbal discussion, written materials, and food models to support subject matter. The instructor gives an explanation of serving size versus portion size, changes in portions sizes over the last 20 years, and what consists of a serving from each food group.   Stress Management:  -Group instruction provided by verbal instruction, video, and written materials to support subject matter.  Instructors review role of stress in heart disease and how to cope with stress positively.     Exercising on Your Own:  -Group instruction provided by verbal instruction, power point, and written materials to support subject.  Instructors discuss benefits of exercise, components of exercise, frequency and intensity of exercise, and end points for exercise.  Also discuss use of nitroglycerin and activating EMS.  Review options of places to exercise outside of rehab.  Review guidelines for sex with heart disease.   Cardiac Drugs I:  -Group instruction provided by verbal instruction and written materials to support subject.  Instructor reviews cardiac drug classes: antiplatelets, anticoagulants, beta blockers, and statins.  Instructor discusses reasons, side effects, and lifestyle considerations for each drug class.   Cardiac Drugs II:  -Group instruction provided by verbal instruction and written materials  to support subject.  Instructor reviews cardiac drug classes: angiotensin converting enzyme inhibitors (ACE-I), angiotensin II receptor blockers (ARBs), nitrates, and calcium channel blockers.  Instructor discusses reasons, side effects, and lifestyle considerations for each drug class.   Anatomy and Physiology of the Circulatory System:  Group verbal and written instruction and  models provide basic cardiac anatomy and physiology, with the coronary electrical and arterial systems. Review of: AMI, Angina, Valve disease, Heart Failure, Peripheral Artery Disease, Cardiac Arrhythmia, Pacemakers, and the ICD.   Other Education:  -Group or individual verbal, written, or video instructions that support the educational goals of the cardiac rehab program.   Holiday Eating Survival Tips:  -Group instruction provided by PowerPoint slides, verbal discussion, and written materials to support subject matter. The instructor gives patients tips, tricks, and techniques to help them not only survive but enjoy the holidays despite the onslaught of food that accompanies the holidays.   Knowledge Questionnaire Score: Knowledge Questionnaire Score - 04/19/20 1604      Knowledge Questionnaire Score   Pre Score  19/24       Core Components/Risk Factors/Patient Goals at Admission: Personal Goals and Risk Factors at Admission - 04/19/20 1605      Core Components/Risk Factors/Patient Goals on Admission    Weight Management  Yes;Obesity    Intervention  Weight Management/Obesity: Establish reasonable short term and long term weight goals.;Obesity: Provide education and appropriate resources to help participant work on and attain dietary goals.    Admit Weight  166 lb 14.2 oz (75.7 kg)    Expected Outcomes  Short Term: Continue to assess and modify interventions until short term weight is achieved;Long Term: Adherence to nutrition and physical activity/exercise program aimed toward attainment of established weight goal;Weight Loss: Understanding of general recommendations for a balanced deficit meal plan, which promotes 1-2 lb weight loss per week and includes a negative energy balance of [phone] kcal/d;Understanding recommendations for meals to include 15-35% energy as protein, 25-35% energy from fat, 35-60% energy from carbohydrates, less than 200mg  of dietary cholesterol, 20-35 gm of total  fiber daily;Understanding of distribution of calorie intake throughout the day with the consumption of 4-5 meals/snacks    Diabetes  Yes    Intervention  Provide education about signs/symptoms and action to take for hypo/hyperglycemia.;Provide education about proper nutrition, including hydration, and aerobic/resistive exercise prescription along with prescribed medications to achieve blood glucose in normal ranges: Fasting glucose 65-99 mg/dL    Expected Outcomes  Short Term: Participant verbalizes understanding of the signs/symptoms and immediate care of hyper/hypoglycemia, proper foot care and importance of medication, aerobic/resistive exercise and nutrition plan for blood glucose control.;Long Term: Attainment of HbA1C < 7%.    Hypertension  Yes    Intervention  Provide education on lifestyle modifcations including regular physical activity/exercise, weight management, moderate sodium restriction and increased consumption of fresh fruit, vegetables, and low fat dairy, alcohol moderation, and smoking cessation.;Monitor prescription use compliance.    Expected Outcomes  Short Term: Continued assessment and intervention until BP is < 140/69mm HG in hypertensive participants. < 130/55mm HG in hypertensive participants with diabetes, heart failure or chronic kidney disease.;Long Term: Maintenance of blood pressure at goal levels.    Lipids  Yes    Intervention  Provide education and support for participant on nutrition & aerobic/resistive exercise along with prescribed medications to achieve LDL 70mg , HDL >40mg .    Expected Outcomes  Short Term: Participant states understanding of desired cholesterol values and is compliant with medications prescribed. Participant is  following exercise prescription and nutrition guidelines.;Long Term: Cholesterol controlled with medications as prescribed, with individualized exercise RX and with personalized nutrition plan. Value goals: LDL < 70mg , HDL > 40 mg.        Core Components/Risk Factors/Patient Goals Review:  Goals and Risk Factor Review    Row Name 04/27/20 1437             Core Components/Risk Factors/Patient Goals Review   Personal Goals Review  Weight Management/Obesity;Hypertension;Diabetes;Lipids       Review  Bevelyn started exercise on 04/27/20 and did well with exercise.       Expected Outcomes  Asiya will continue to particpate in phase 2 cardiac rehab for exercise, nutrition and lifestyle modifications          Core Components/Risk Factors/Patient Goals at Discharge (Final Review):  Goals and Risk Factor Review - 04/27/20 1437      Core Components/Risk Factors/Patient Goals Review   Personal Goals Review  Weight Management/Obesity;Hypertension;Diabetes;Lipids    Review  Ankitha started exercise on 04/27/20 and did well with exercise.    Expected Outcomes  Teadra will continue to particpate in phase 2 cardiac rehab for exercise, nutrition and lifestyle modifications       ITP Comments: ITP Comments    Row Name 04/19/20 1409 05/04/20 1516         ITP Comments  Dr. Fransico Him Medical Director Cardiac Rehab Judy [name]  30 day ITP review: Judy [name] has completed 2 CR exercise sessions since admission. Today she was absent secondary to follow up with cardiologist. She worked to an RPE of 11-14 and complained of SOB with ambulation secondary to mask wearing. States she does not have SOB at home when exercising without a mask. VSS during exercise and no other complaints noted. It is too early in patients CR enrollment to assess for progression towards personal goals of improved ambulation without falls.         Comments: see ITP comments

## 2020-05-04 NOTE — Telephone Encounter (Signed)
Signed CMN for CPAP faxed to [facility]

## 2020-05-04 NOTE — Patient Instructions (Signed)
Medication Instructions:  Start Lasix 20 mg daily  Start Plavix 75 mg daily  Continue all other medications.   *If you need a refill on your cardiac medications before your next appointment, please call your pharmacy*   Follow-Up: At [facility], you and your health needs are our priority.  As part of our continuing mission to provide you with exceptional heart care, we have created designated Provider Care Teams.  These Care Teams include your primary Cardiologist (physician) and Advanced Practice Providers (APPs -  Physician Assistants and Nurse Practitioners) who all work together to provide you with the care you need, when you need it.  We recommend signing up for the patient portal called "MyChart".  Sign up information is provided on this After Visit Summary.  MyChart is used to connect with patients for Virtual Visits (Telemedicine).  Patients are able to view lab/test results, encounter notes, upcoming appointments, etc.  Non-urgent messages can be sent to your provider as well.   To learn more about what you can do with MyChart, go to [email or web address].    Your next appointment:   3 month(s)  The format for your next appointment:   In Person  Provider:   Eleonore Chiquito, MD   Other Instructions

## 2020-05-05 ENCOUNTER — Other Ambulatory Visit: Payer: Self-pay | Admitting: Critical Care Medicine

## 2020-05-06 ENCOUNTER — Encounter (HOSPITAL_COMMUNITY)
Admission: RE | Admit: 2020-05-06 | Discharge: 2020-05-06 | Disposition: A | Discharge status: Home / Self Care | Payer: Medicare Other | Source: Ambulatory Visit | Attending: Cardiovascular Disease | Admitting: Cardiovascular Disease

## 2020-05-06 ENCOUNTER — Other Ambulatory Visit: Payer: Self-pay

## 2020-05-06 DIAGNOSIS — I214 Non-ST elevation (NSTEMI) myocardial infarction: Secondary | ICD-10-CM | POA: Diagnosis present

## 2020-05-06 DIAGNOSIS — Z951 Presence of aortocoronary bypass graft: Secondary | ICD-10-CM | POA: Diagnosis not present

## 2020-05-09 ENCOUNTER — Encounter (HOSPITAL_COMMUNITY)
Admission: RE | Admit: 2020-05-09 | Discharge: 2020-05-09 | Disposition: A | Discharge status: Home / Self Care | Payer: Medicare Other | Source: Ambulatory Visit | Attending: Cardiovascular Disease | Admitting: Cardiovascular Disease

## 2020-05-09 ENCOUNTER — Other Ambulatory Visit: Payer: Self-pay

## 2020-05-09 DIAGNOSIS — Z951 Presence of aortocoronary bypass graft: Secondary | ICD-10-CM

## 2020-05-09 DIAGNOSIS — I214 Non-ST elevation (NSTEMI) myocardial infarction: Secondary | ICD-10-CM

## 2020-05-09 LAB — GLUCOSE, CAPILLARY: Glucose-Capillary: 132 mg/dL — ABNORMAL HIGH (ref 70–99)

## 2020-05-09 NOTE — Progress Notes (Signed)
Judy [name] 79 y.o. female Nutrition Note  Diagnosis:  Past Medical History:  Diagnosis Date  . Anxiety   . Back pain   . Complication of anesthesia 2003   gallbladder surgery-resp. arrest-stop operation and pt. went to ICU - patient stated she has back surgery since then and had no trouble at all  . Depression   . Diabetes ([place])   . Dyspnea   . Gout   . History of kidney stones   . Hypertension   . Leg edema   . Pancreatitis, chronic ([place]) 03/2016  . Pneumonia due to COVID-19 virus 12/03/2019  . Sleep apnea      Medications reviewed.   Current Outpatient Medications:  .  apixaban (ELIQUIS) 5 MG TABS tablet, Take 1 tablet (5 mg total) by mouth 2 (two) times daily., Disp: 60 tablet, Rfl: 0 .  atorvastatin (LIPITOR) 20 MG tablet, Take 1 tablet (20 mg total) by mouth at bedtime., Disp: 90 tablet, Rfl: 2 .  clopidogrel (PLAVIX) 75 MG tablet, Take 1 tablet (75 mg total) by mouth daily., Disp: 90 tablet, Rfl: 3 .  empagliflozin (JARDIANCE) 10 MG TABS tablet, Take 10 mg by mouth daily., Disp: 30 tablet, Rfl: 4 .  furosemide (LASIX) 20 MG tablet, Take 1 tablet (20 mg total) by mouth daily., Disp: 90 tablet, Rfl: 3 .  glucose blood (ACCU-CHEK AVIVA) test strip, Test twice daily, Disp: 100 each, Rfl: 2 .  Lancets (ACCU-CHEK SAFE-T PRO) lancets, Test twice daily, Disp: 100 each, Rfl: 11 .  losartan (COZAAR) 25 MG tablet, Take 1 tablet (25 mg total) by mouth daily., Disp: 30 tablet, Rfl: 3 .  metFORMIN (GLUCOPHAGE) 1000 MG tablet, Take 1 tablet (1,000 mg total) by mouth 2 (two) times daily with a meal., Disp: 60 tablet, Rfl: 4 .  metoprolol tartrate (LOPRESSOR) 25 MG tablet, Take 0.5 tablets (12.5 mg total) by mouth 2 (two) times daily., Disp: 30 tablet, Rfl: 1 .  NON FORMULARY, 1 each by Other route See admin instructions. CPAP machine nightly, Disp: , Rfl:  .  nystatin cream (MYCOSTATIN), Apply 1 application topically 2 (two) times daily as needed for dry skin. , Disp: , Rfl:  .   sertraline (ZOLOFT) 100 MG tablet, Take 100 mg by mouth daily., Disp: , Rfl:  .  traMADol (ULTRAM) 50 MG tablet, Take 1-2 tablets (50-100 mg total) by mouth every 8 (eight) hours as needed., Disp: 30 tablet, Rfl: 0 .  traZODone (DESYREL) 100 MG tablet, Take 100 mg by mouth at bedtime as needed., Disp: , Rfl:    Ht Readings from Last 1 Encounters:  05/04/20 5' (1.524 m)     Wt Readings from Last 3 Encounters:  05/04/20 172 lb 12.8 oz (78.4 kg)  04/21/20 169 lb (76.7 kg)  04/19/20 166 lb 14.2 oz (75.7 kg)     There is no height or weight on file to calculate BMI.   Social History   Tobacco Use  Smoking Status Never Smoker  Smokeless Tobacco Never Used     Lab Results  Component Value Date   CHOL 89 03/01/2020   Lab Results  Component Value Date   HDL 39 (L) 03/01/2020   Lab Results  Component Value Date   LDLCALC 30 03/01/2020   Lab Results  Component Value Date   TRIG 101 03/01/2020     Lab Results  Component Value Date   HGBA1C 7.1 (H) 02/09/2020     CBG (last 3)  Recent Labs  05/09/20 1336  GLUCAP 132*     Nutrition Note  Spoke with pt. Nutrition Plan and Nutrition Survey goals reviewed with pt. 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  She Reviewed cholesterol, specifically recommendations for low HDL.  Pt has Type 2 Diabetes.  Pt checks CBG's 2 times a day. Fasting CBG's reportedly 98-120 mg/dL. Post prandial normally below 180 mg/dl but outliers 200-210 mg/dl occur when she eats chinese foods or high carb meals.  Per discussion, pt does use canned/convenience foods often. Pt does not add salt to food. Pt does eat out frequently.   She would like to add more veggies to her diet, keep CBGs under control, and lose another 20 lbs.  Pt expressed understanding of the information reviewed.    Nutrition Diagnosis ? Excessive carbohydrate intake related to consumption of convenience foods and sugary beverages as evidenced by A1C 7.1    Nutrition Intervention ? Pt's individual nutrition plan reviewed with pt. ? Benefits of adopting Heart Healthy diet discussed when Medficts reviewed.   ? Continue client-centered nutrition education by RD, as part of interdisciplinary care.  Goal(s) ? Pt to build a healthy plate including vegetables, fruits, whole grains, and low-fat dairy products in a heart healthy meal plan. ? Improved blood glucose control as evidenced by next A1C below 7.0. ? Pt able to name foods that affect blood glucose  Plan:   Will provide client-centered nutrition education as part of interdisciplinary care  Monitor and evaluate progress toward nutrition goal with team.   Michaele Offer, MS, RDN, LDN

## 2020-05-11 ENCOUNTER — Emergency Department (HOSPITAL_COMMUNITY): Payer: Medicare Other

## 2020-05-11 ENCOUNTER — Encounter (HOSPITAL_COMMUNITY): Payer: Medicare Other

## 2020-05-11 ENCOUNTER — Telehealth: Payer: Self-pay | Admitting: Cardiovascular Disease

## 2020-05-11 ENCOUNTER — Encounter (HOSPITAL_COMMUNITY): Payer: Self-pay

## 2020-05-11 ENCOUNTER — Emergency Department (HOSPITAL_COMMUNITY)
Admission: EM | Admit: 2020-05-11 | Discharge: 2020-05-11 | Disposition: A | Discharge status: Home / Self Care | Payer: Medicare Other | Attending: Emergency Medicine | Admitting: Emergency Medicine

## 2020-05-11 ENCOUNTER — Other Ambulatory Visit: Payer: Self-pay

## 2020-05-11 DIAGNOSIS — W19XXXA Unspecified fall, initial encounter: Secondary | ICD-10-CM

## 2020-05-11 DIAGNOSIS — Z79899 Other long term (current) drug therapy: Secondary | ICD-10-CM | POA: Diagnosis not present

## 2020-05-11 DIAGNOSIS — S0990XA Unspecified injury of head, initial encounter: Secondary | ICD-10-CM | POA: Diagnosis present

## 2020-05-11 DIAGNOSIS — R42 Dizziness and giddiness: Secondary | ICD-10-CM | POA: Diagnosis not present

## 2020-05-11 DIAGNOSIS — Z7984 Long term (current) use of oral hypoglycemic drugs: Secondary | ICD-10-CM | POA: Diagnosis not present

## 2020-05-11 DIAGNOSIS — I509 Heart failure, unspecified: Secondary | ICD-10-CM | POA: Diagnosis not present

## 2020-05-11 DIAGNOSIS — I110 Hypertensive heart disease with heart failure: Secondary | ICD-10-CM | POA: Diagnosis not present

## 2020-05-11 DIAGNOSIS — E119 Type 2 diabetes mellitus without complications: Secondary | ICD-10-CM | POA: Diagnosis not present

## 2020-05-11 DIAGNOSIS — Y92009 Unspecified place in unspecified non-institutional (private) residence as the place of occurrence of the external cause: Secondary | ICD-10-CM | POA: Insufficient documentation

## 2020-05-11 DIAGNOSIS — R002 Palpitations: Secondary | ICD-10-CM | POA: Diagnosis not present

## 2020-05-11 DIAGNOSIS — W108XXA Fall (on) (from) other stairs and steps, initial encounter: Secondary | ICD-10-CM | POA: Diagnosis not present

## 2020-05-11 DIAGNOSIS — R0602 Shortness of breath: Secondary | ICD-10-CM | POA: Diagnosis not present

## 2020-05-11 DIAGNOSIS — Y999 Unspecified external cause status: Secondary | ICD-10-CM | POA: Diagnosis not present

## 2020-05-11 DIAGNOSIS — Y939 Activity, unspecified: Secondary | ICD-10-CM | POA: Insufficient documentation

## 2020-05-11 DIAGNOSIS — Z7901 Long term (current) use of anticoagulants: Secondary | ICD-10-CM | POA: Diagnosis not present

## 2020-05-11 DIAGNOSIS — R0789 Other chest pain: Secondary | ICD-10-CM | POA: Diagnosis not present

## 2020-05-11 LAB — CBC
HCT: 41.9 % (ref 36.0–46.0)
Hemoglobin: 13.1 g/dL (ref 12.0–15.0)
MCH: 28 pg (ref 26.0–34.0)
MCHC: 31.3 g/dL (ref 30.0–36.0)
MCV: 89.5 fL (ref 80.0–100.0)
Platelets: 296 10*3/uL (ref 150–400)
RBC: 4.68 MIL/uL (ref 3.87–5.11)
RDW: 14.1 % (ref 11.5–15.5)
WBC: 8.1 10*3/uL (ref 4.0–10.5)
nRBC: 0 % (ref 0.0–0.2)

## 2020-05-11 LAB — BASIC METABOLIC PANEL
Anion gap: 13 (ref 5–15)
BUN: 18 mg/dL (ref 8–23)
CO2: 28 mmol/L (ref 22–32)
Calcium: 9.4 mg/dL (ref 8.9–10.3)
Chloride: 100 mmol/L (ref 98–111)
Creatinine, Ser: 1.17 mg/dL — ABNORMAL HIGH (ref 0.44–1.00)
GFR calc Af Amer: 52 mL/min — ABNORMAL LOW (ref >60)
GFR calc non Af Amer: 45 mL/min — ABNORMAL LOW (ref >60)
Glucose, Bld: 129 mg/dL — ABNORMAL HIGH (ref 70–99)
Potassium: 3.9 mmol/L (ref 3.5–5.1)
Sodium: 141 mmol/L (ref 135–145)

## 2020-05-11 LAB — BRAIN NATRIURETIC PEPTIDE: B Natriuretic Peptide: 129.3 pg/mL — ABNORMAL HIGH (ref 0.0–100.0)

## 2020-05-11 LAB — TROPONIN I (HIGH SENSITIVITY)
Troponin I (High Sensitivity): 12 ng/L (ref <18)
Troponin I (High Sensitivity): 12 ng/L (ref <18)

## 2020-05-11 NOTE — ED Provider Notes (Signed)
[facility] DEPT Provider Note   CSN: [id number] Arrival date & time: 05/11/20  5176     History Chief Complaint  Patient presents with  . Palpitations  . Head Injury    Judy [name] is a 79 y.o. female with past medical history notable for HTN, HLD, CHF, DM, CAD s/p CABG, CVA, and atrial fibrillation on anticoagulation who presents to the ED sent here from her primary care provider for evaluation of head injury and chest pain in the context of recent MIs.  Patient states that approximately 36 hours ago she sustained a mechanical fall while exiting her porch and fell a distance of 3 stairs and landed on her back and head.  She is on blood thinners.  She did report some headache symptoms and lightheadedness that prompted her to come to the ED for evaluation.  She also states that she developed some chest pain while walking around last night that felt like a "heaviness" and pressure.  She had plan to see her primary care provider because she has been having difficulty sleeping at night and was hoping to have her medications increased, but was immediately redirected here to the ER given her reported history and symptoms.  On my exam, she does not appear to be in any acute distress.  She is endorsing chest discomfort and shortness of symptoms, particularly with exertion.  She denies any cough, fevers or chills, current dizziness, blurred vision, back pain, abdominal pain, nausea or vomiting, diaphoresis, urinary symptoms, changes in bowel habits, or other symptoms.   HPI     Past Medical History:  Diagnosis Date  . Anxiety   . Back pain   . Complication of anesthesia 2003   gallbladder surgery-resp. arrest-stop operation and pt. went to ICU - patient stated she has back surgery since then and had no trouble at all  . Depression   . Diabetes ([place])   . Dyspnea   . Gout   . History of kidney stones   . Hypertension   . Leg edema   . Pancreatitis, chronic  ([place]) 03/2016  . Pneumonia due to COVID-19 virus 12/03/2019  . Sleep apnea     Patient Active Problem List   Diagnosis Date Noted  . Pleural effusion 04/12/2020  . Atrial fibrillation ([place]) 03/11/2020  . S/P CABG (coronary artery bypass graft) 03/03/2020  . Cerebral thrombosis with cerebral infarction 03/02/2020  . Hyperlipidemia associated with type 2 diabetes mellitus ([place]) 02/09/2020  . Type 2 diabetes mellitus, controlled ([place]) 01/12/2020  . Class 2 severe obesity with serious comorbidity and body mass index (BMI) of 35.0 to 35.9 in adult, unspecified obesity type ([place]) 12/25/2018  . Obstructive sleep apnea 11/04/2017  . Lumbar stenosis with neurogenic claudication 11/19/2016  . HTN (hypertension) 03/21/2016  . Anxiety   . Depression     Past Surgical History:  Procedure Laterality Date  . BACK SURGERY    . BREAST LUMPECTOMY WITH RADIOACTIVE SEED LOCALIZATION Right 10/23/2019   Procedure: RIGHT BREAST LUMPECTOMY WITH RADIOACTIVE SEED LOCALIZATION;  Surgeon: Jovita Kussmaul, MD;  Location: [facility];  Service: General;  Laterality: Right;  . CARDIAC SURGERY    . CHOLECYSTECTOMY    . COLONOSCOPY  2013  . CORONARY ARTERY BYPASS GRAFT N/A 03/03/2020   Procedure: CORONARY ARTERY BYPASS GRAFTING (CABG), ON PUMP, TIMES FIVE, USING LEFT INTERNAL MAMMARY ARTERY AND ENDOSCOPICALLY HARVESTED BILATERAL GREATER SAPHENOUS VEINS -flow track only;  Surgeon: Lajuana Matte, MD;  Location: [facility];  Service: Open Heart Surgery;  Laterality: N/A;  . LEFT HEART CATH AND CORONARY ANGIOGRAPHY N/A 03/01/2020   Procedure: LEFT HEART CATH AND CORONARY ANGIOGRAPHY;  Surgeon: Belva Crome, MD;  Location: [facility];  Service: Cardiovascular;  Laterality: N/A;  . TEE WITHOUT CARDIOVERSION N/A 03/03/2020   Procedure: TRANSESOPHAGEAL ECHOCARDIOGRAM (TEE);  Surgeon: Lajuana Matte, MD;  Location: [facility];  Service: Open Heart Surgery;  Laterality: N/A;     OB History    Gravida  1   Para       Term      Preterm      AB      Living  1     SAB      TAB      Ectopic      Multiple      Live Births              Family History  Problem Relation Age of Onset  . Dementia Mother   . AAA (abdominal aortic aneurysm) Father   . Breast cancer Maternal Aunt     Social History   Tobacco Use  . Smoking status: Never Smoker  . Smokeless tobacco: Never Used  Substance Use Topics  . Alcohol use: No    Alcohol/week: 0.0 standard drinks  . Drug use: No    Home Medications Prior to Admission medications   Medication Sig Start Date End Date Taking? Authorizing Provider  apixaban (ELIQUIS) 5 MG TABS tablet Take 1 tablet (5 mg total) by mouth 2 (two) times daily. 05/04/20 06/03/20 Yes O'Neal, Cassie Freer, MD  atorvastatin (LIPITOR) 20 MG tablet Take 1 tablet (20 mg total) by mouth at bedtime. 05/04/20  Yes O'Neal, Cassie Freer, MD  clopidogrel (PLAVIX) 75 MG tablet Take 1 tablet (75 mg total) by mouth daily. 05/04/20  Yes O'Neal, Cassie Freer, MD  empagliflozin (JARDIANCE) 10 MG TABS tablet Take 10 mg by mouth daily. 04/04/20  Yes Elsie Stain, MD  furosemide (LASIX) 20 MG tablet Take 1 tablet (20 mg total) by mouth daily. 05/04/20 08/02/20 Yes O'Neal, Cassie Freer, MD  losartan (COZAAR) 25 MG tablet Take 1 tablet (25 mg total) by mouth daily. 04/04/20  Yes Elsie Stain, MD  metFORMIN (GLUCOPHAGE) 1000 MG tablet Take 1 tablet (1,000 mg total) by mouth 2 (two) times daily with a meal. 04/04/20  Yes Elsie Stain, MD  metoprolol tartrate (LOPRESSOR) 25 MG tablet Take 0.5 tablets (12.5 mg total) by mouth 2 (two) times daily. 05/04/20  Yes O'Neal, Cassie Freer, MD  sertraline (ZOLOFT) 100 MG tablet Take 100 mg by mouth daily. 04/28/20  Yes [provider]  traMADol (ULTRAM) 50 MG tablet Take 1-2 tablets (50-100 mg total) by mouth every 8 (eight) hours as needed. Patient taking differently: Take 50 mg by mouth 2 (two) times daily.  04/12/20  Yes Elsie Stain, MD   traZODone (DESYREL) 100 MG tablet Take 100 mg by mouth at bedtime as needed. 04/28/20  Yes [provider]  glucose blood (ACCU-CHEK AVIVA) test strip Test twice daily 04/04/20   Elsie Stain, MD  Lancets (ACCU-CHEK SAFE-T PRO) lancets Test twice daily 04/04/20   Elsie Stain, MD  NON FORMULARY 1 each by Other route See admin instructions. CPAP machine nightly    [provider]    Allergies    Patient has no known allergies.  Review of Systems   Review of Systems  All other systems reviewed and are  negative.   Physical Exam Updated Vital Signs BP (!) 144/87   Pulse 81   Temp 98.3 F (36.8 C) (Oral)   Resp 15   SpO2 98%   Physical Exam Vitals and nursing note reviewed. Exam conducted with a chaperone present.  Constitutional:      Appearance: Normal appearance.  HENT:     Head: Normocephalic.     Comments: No palpable skull defects.  No obvious hematoma.  No evidence of trauma. Eyes:     General: No scleral icterus.    Extraocular Movements: Extraocular movements intact.     Conjunctiva/sclera: Conjunctivae normal.     Pupils: Pupils are equal, round, and reactive to light.  Neck:     Comments: No midline cervical tenderness palpation.  No overlying skin changes. Pulmonary:     Effort: Pulmonary effort is normal.  Musculoskeletal:     Cervical back: Normal range of motion and neck supple. No rigidity.  Skin:    General: Skin is dry.  Neurological:     Mental Status: She is alert.     GCS: GCS eye subscore is 4. GCS verbal subscore is 5. GCS motor subscore is 6.  Psychiatric:        Mood and Affect: Mood normal.        Behavior: Behavior normal.        Thought Content: Thought content normal.     ED Results / Procedures / Treatments   Labs (all labs ordered are listed, but only abnormal results are displayed) Labs Reviewed  BRAIN NATRIURETIC PEPTIDE - Abnormal; Notable for the following components:      Result Value   B Natriuretic  Peptide 129.3 (*)    All other components within normal limits  BASIC METABOLIC PANEL - Abnormal; Notable for the following components:   Glucose, Bld 129 (*)    Creatinine, Ser 1.17 (*)    GFR calc non Af Amer 45 (*)    GFR calc Af Amer 52 (*)    All other components within normal limits  CBC  TROPONIN I (HIGH SENSITIVITY)  TROPONIN I (HIGH SENSITIVITY)    EKG EKG Interpretation  Date/Time:  Wednesday May 11 2020 09:44:39 EDT Ventricular Rate:  80 PR Interval:    QRS Duration: 84 QT Interval:  394 QTC Calculation: 455 R Axis:   13 Text Interpretation: Sinus rhythm Probable left atrial enlargement No acute changes No significant change since last tracing Confirmed by Varney Biles [phone]) on 05/11/2020 11:03:06 AM   Radiology CT Head Wo Contrast  Result Date: 05/11/2020 CLINICAL DATA:  Posttraumatic headache EXAM: CT HEAD WITHOUT CONTRAST TECHNIQUE: Contiguous axial images were obtained from the base of the skull through the vertex without intravenous contrast. COMPARISON:  March 01, 2020 FINDINGS: Brain: No evidence of acute infarction, hemorrhage, hydrocephalus, extra-axial collection or mass lesion/mass effect. Signs of atrophy and chronic microvascular ischemic change, unchanged. Vascular: No hyperdense vessel or unexpected calcification. Skull: Bilateral hearing aids in place. No acute or destructive bone process. Sinuses/Orbits: No acute finding. Limited visualization of paranasal sinuses and orbits. Other: None. IMPRESSION: 1. No acute intracranial pathology. 2. Signs of atrophy and chronic microvascular ischemic change. Electronically Signed   By: Zetta Bills M.D.   On: 05/11/2020 11:37   DG Chest Portable 1 View  Result Date: 05/11/2020 CLINICAL DATA:  Chest pain, shortness of breath. Cardiac bypass 2 months ago. Recent fall. EXAM: PORTABLE CHEST 1 VIEW COMPARISON:  04/21/2020 and CT chest 02/29/2020. FINDINGS: Trachea is midline.  Cardiac silhouette is enlarged and  accentuated by AP semi upright technique. Lungs are clear. No pleural fluid. IMPRESSION: No acute findings. Electronically Signed   By: Lorin Picket M.D.   On: 05/11/2020 11:04    Procedures Procedures (including critical care time)  Medications Ordered in ED Medications - No data to display  ED Course  I have reviewed the triage vital signs and the nursing notes.  Pertinent labs & imaging results that were available during my care of the patient were reviewed by me and considered in my medical decision making (see chart for details).    MDM Rules/Calculators/A&P                      Patient's laboratory work-up here today was entirely unremarkable.  Troponin was obtained and turned x2 which stayed stable and WNL at 12.  Given her recent CABG after 5 vessel disease, low suspicion for ACS at this time.  However, will encourage her to follow-up with a cardiologist regarding today's encounter and for her dyspnea on exertion.  I reviewed patient's med record and she has had multiple CTA chest since her COVID-19 diagnosis in 2020 which were negative for PE.  She has been taking her anticoagulation, as directed.  Do not feel as though D-dimer or CTA repeat is warranted.  While she reported palpitations in triage, her EKG demonstrates NSR and she is no longer experiencing palpitations.   BMP demonstrated chronic kidney disease, however consistent with her baseline.  CBC all WNL.  BNP only mildly elevated at 129.3.  Patient has LVEF 45 to 50%, however given her B/L edema she had been started on 20 mg Lasix p.o. daily.  While there is LE edema on my exam, I suspect that this is at least partially dependent and related to valvular dysfunction, encouraged patient to wear compression stockings and keep legs elevated in addition to continued Lasix.  Patient was quite reassured by her negative head CT despite her fall while on blood thinners.  She is denying any headache my examination and feels prepared  for discharge.  Plan is for her to follow-up with her primary care provider as she had intended earlier to discuss today's encounter and for adjustment of her sleep medications.  Discussed case with Dr. Kathrynn Humble who personally evaluated patient and agrees with assessment and plan.  Strict ED return precautions discussed with patient.  All of the evaluation and work-up results were discussed with the patient and any family at bedside. They were provided opportunity to ask any additional questions and have none at this time. They have expressed understanding of verbal discharge instructions as well as return precautions and are agreeable to the plan.   Final Clinical Impression(s) / ED Diagnoses Final diagnoses:  Fall, initial encounter    Rx / DC Orders ED Discharge Orders    None       Corena Herter, PA-C 05/11/20 1329    Varney Biles, MD 05/11/20 1455

## 2020-05-11 NOTE — Telephone Encounter (Signed)
Judy [name] is calling stating the hospital advised her upon her discharge her to call Dr. Audie Box and his nurse to discuss her hospital visit. She also states she was in rehab and they advised her she will not be able to go back for rehab until Dr. Audie Box writes her a note releasing her to do so again. Please advise.

## 2020-05-11 NOTE — Telephone Encounter (Signed)
Follow up ° ° °Patient is returning your call. Please call. ° ° ° °

## 2020-05-11 NOTE — Telephone Encounter (Signed)
I just reviewed. Looks like they put her on lasix which is fine. I see no other changes. Sure, we can give her a letter to go back to rehab.   [name], [place]  [address] [place], [place] [address] [phone]  6:45 PM

## 2020-05-11 NOTE — Telephone Encounter (Signed)
Spoke with patient and she was instructed by ED to reach out to Dr Audie Box regarding recent ED visit Per patient medications changed She would like Dr Audie Box to review ED notes and make sure ok to return to rehab, will need note Patient willing to come for visit if needed  Will forward to Dr Audie Box for review

## 2020-05-11 NOTE — ED Triage Notes (Signed)
Pt arrives POV from home. Pt reports that she fell Monday night hitting her head. Pt is on blood thinners. Pt denies LOC, pt denies dizziness, denies headache.  Pt reports that last night she began having " a funny feeling in chest. Something doesn't feel right" Pt denies chest pain. Pt reports that she bypass 2 months ago. Pt endorses SHOB.

## 2020-05-11 NOTE — Telephone Encounter (Signed)
Left message for patient to call back  

## 2020-05-11 NOTE — Discharge Instructions (Addendum)
Please follow-up with your primary care provider as well as her cardiologist today regarding today's encounter.  Your laboratory work-up, physical exam, and head imaging was all quite reassuring.  Please continue with your medications, as directed.  Return to the ED or seek immediate medical attention should experience any new or worsening symptoms.

## 2020-05-12 NOTE — Telephone Encounter (Signed)
Left message to call back  

## 2020-05-13 ENCOUNTER — Encounter: Payer: Self-pay | Admitting: Emergency Medicine

## 2020-05-13 ENCOUNTER — Telehealth (HOSPITAL_COMMUNITY): Payer: Self-pay | Admitting: *Deleted

## 2020-05-13 ENCOUNTER — Encounter (HOSPITAL_COMMUNITY): Payer: Medicare Other

## 2020-05-13 NOTE — Telephone Encounter (Signed)
error 

## 2020-05-13 NOTE — Telephone Encounter (Signed)
Received notification that pt may resume rehab from Dr. Gus Height.  Called and spoke to pt.  Pt is feeling much better and the soreness is just about gone.  Pt has plans this afternoon and will return on Monday.  Pt thanked me for the call. Cherre Huger, BSN Cardiac and Training and development officer

## 2020-05-13 NOTE — Telephone Encounter (Signed)
Patient called to ask if she needs a note to go back to rehab, or if Dr. Audie Box needs to see patient. Please call.

## 2020-05-13 NOTE — Telephone Encounter (Signed)
Pt advised and I will route and staff message Cardiac rehab for her to resume.

## 2020-05-16 ENCOUNTER — Encounter (HOSPITAL_COMMUNITY)
Admission: RE | Admit: 2020-05-16 | Discharge: 2020-05-16 | Disposition: A | Discharge status: Home / Self Care | Payer: Medicare Other | Source: Ambulatory Visit | Attending: Cardiovascular Disease | Admitting: Cardiovascular Disease

## 2020-05-16 ENCOUNTER — Other Ambulatory Visit: Payer: Self-pay

## 2020-05-16 DIAGNOSIS — Z951 Presence of aortocoronary bypass graft: Secondary | ICD-10-CM

## 2020-05-16 DIAGNOSIS — I214 Non-ST elevation (NSTEMI) myocardial infarction: Secondary | ICD-10-CM

## 2020-05-16 NOTE — Progress Notes (Signed)
Subjective:    Patient ID: Judy [name], female    DOB: 06/19/1941, 79 y.o.   MRN: [id number] History of Present Illness: 79 y.o.F post hosp for covid.  No pcp.  Hx HTN, OSA, T2DM, Depression, anxiety, Lumbar stenosis, Vit D and B12 deficiency   This is a telephone visit and post hospital visit as well for this patient who was in the hospital for Covid pneumonia between December 31 and January 8.  The patient used to have Dr. Zada Girt is here primary care physician but he just retired at the end of last year.  She is not yet achieved a new primary care physician.  The patient was admitted with hypoxemia and Covid pneumonia.  Since discharge she still does not have any energy has some headaches feels that she has nasal congestion notes some shortness of breath with exertion but no cough.  She has muscle aches but no fever.  She does not have a home pulse oximeter.  Physical therapy came to see her and she is walking better without the cane.  She is not yet gone out of her home.  She can do some ADLs but not many because of her situation.  She does have type 2 diabetes and her blood sugars have been running in the 140 range on 500 mg daily of Metformin.  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  She is wanting a when she can go back to work.  She has home aide coming in giving her baths.  She does not have a method to measure her blood pressure.  The blood pressure was elevated during the hospitalization.  Also she had hyperkalemia during the hospitalization.  She is on Aldactone.  Below is the discharge summary  Admit date: 12/03/2019 Discharge date: 12/11/2019  PCP: Patient, No Pcp Per  DISCHARGE DIAGNOSES:  Acute respiratory failure with hypoxia, resolved Pneumonia due to COVID-19 Chronic kidney disease stage IIIa Diabetes mellitus type 2 uncontrolled with hyperglycemia Essential hypertension History of anxiety  and depression  RECOMMENDATIONS FOR OUTPATIENT FOLLOW UP: 1. Home health    Home Health: PT and OT Equipment/Devices: 3n 1  CODE STATUS: Full code  DISCHARGE CONDITION: fair  Diet recommendation: Modified carbohydrate  INITIAL HISTORY: 79 y.o.femalepast medical history significant for diabetes mellitus type 2, essential hypertension chronic pain presents to the ED with 3 days of shortness of breath the patient reports that she was diagnosed with COVID-19  days ago during this time she been having menorrhagia anorexia with worsening over the next 6 days, and about 4 days prior to admission she started developing shortness of breath fever chills in the ED she was noted to be hypoxic in the 80s placed on 5 L and improved to 91%. She was also found to be hyponatremic hyperkalemic, SARS-CoV-2 PCR was positive chest x-ray showed right-sided infiltrates   HOSPITAL COURSE:   Acute respiratory failure with hypoxia secondarily to pneumonia due to COVID-19: Patient was hospitalized and placed on steroids and remdesivir.  She also received Actemra.  She started improving.  She is weaned down to room air.  She did feel anxious and nervous during this hospitalization.  Patient was reassured.  Her inflammatory markers improved.  Seen by PT and OT.  Home health will be ordered.  Son also lives close by and can provide assistance as well.  Chronic kidney disease stage IIIa/hyperkalemia With a baseline creatinine  1.2-1.5.Renal function has been stable. Hyperkalemia improved with Kayexalate.   Hypovolemic hyponatremia: In the setting of decreased oral intake and diuretic use. Her sodium improved with IV fluid hydration.  Diabetes mellitus type 2 uncontrolled with hyperglycemia/chronic kidney disease stage IIIa: Resume Metformin.HbA1c was 8.1.   Essential HTN (hypertension) Continue home medication  Chronic pain/anxiety/depression: Continue home  medications  Obesity Estimated body mass index is 35 kg/m as calculated from the following: Height as of this encounter: 5' (1.524 m). Weight as of this encounter: 81.3 kg.   Note her hemoglobin A1c was 8.1 during the recent hospitalization and she also had hypokalemia and hyponatremia along with chronic kidney disease   2/9 This is a return visit that is face-to-face from this patient who was in the hospital end of December through 8 January for COVID-19 pneumonia.  The last visit was a telephone visit.  Today she comes into the office and has been weaned off oxygen but still remains quite short of breath with minimal exertion.  She had home physical therapy but that has been discontinued and she is actually fallen twice since that time.  She uses a walker but does not use it consistently.  She does have BiPAP for sleep apnea but only uses it at night.  She does not have a pulse oximeter at home.  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  She notes that her blood sugars tend to be fasting 207 to 150 and postprandial 1 60-1 80  This patient had been on Aldactone but we have held this because of hyperkalemia she does maintain diltiazem daily for blood pressure.  She her primary care provider recently retired and she is going to be seeking a new primary care provider in private practice I did tell her I continue to see her for her pulmonary status  She is on zinc vitamin D and vitamin C supplementation at this time This patient also needs to have her vitamin B12 and vitamin D levels checked   02/09/2020 This is a pleasant 79 year old female seen today in return follow-up for severe Covid pneumonia and chronic hypoxic respiratory failure.  Note her respiratory failure has resolved and she is no longer on oxygen.  She is receiving medication for hypertension at 240 mg daily diltiazem.  Patient also has type 2 diabetes with  chronic kidney disease and recent follow-up being that showed improvement in renal function and she was continued on Metformin and Jardiance.  Note in the interim she is obtained a new primary care provider and is establishing with this physician.  We will share records with this physician as well.  I told the patient today that we would prefer that I manage her pulmonary aspects and let her primary care physician manage the rest of her medical issues.  She still needing a walker to move about and is very wobbly when she ambulates without assistance.  She knows to use the cane more regularly and the walker when needed.  She is continue her exercises from home physical therapy.  And she is going out outpatient physical therapy as well.  She has difficulty with sleeping at night and states the Klonopin is not helping as much and causes her to sleep over to 10:30 in the morning.  She is also on the Tofranil as well.  She is yet to try melatonin.  Also she has an old CPAP machine and is wishing to have  this replaced as the mask is falling apart and the machine is 79 years old.  She will need a follow-up CPAP titration study and mask desensitization and I indicated her I would manage this  Note at the last visit her urine microalbumin was normal she does however need a follow-up A1c and I am assuming her primary care provider will obtain this and the patient was unclear of this therefore we will obtain an A1c at this visit  04/12/2020 Since last OV had admit for CABG and CAD and CVA Below is the discharge summary  Admit date: 02/29/2020 Discharge date: 03/08/2020  Admission Diagnoses:  Patient Active Problem List  Diagnosis Date Noted . Cerebral thrombosis with cerebral infarction 03/02/2020 . NSTEMI (non-ST elevated myocardial infarction) ([place]) 02/29/2020 . Hyperlipidemia associated with type 2 diabetes mellitus ([place]) 02/09/2020 . Type 2 diabetes mellitus, controlled ([place]) 01/12/2020 . Class 2  severe obesity with serious comorbidity and body mass index (BMI) of 35.0 to 35.9 in adult, unspecified obesity type ([place]) 12/25/2018 . Obstructive sleep apnea 11/04/2017 . Lumbar stenosis with neurogenic claudication 11/19/2016 . HTN (hypertension) 03/21/2016 . Anxiety  . Depression    Discharge Diagnoses:   Patient Active Problem List  Diagnosis Date Noted . S/P CABG (coronary artery bypass graft) 03/03/2020 . Cerebral thrombosis with cerebral infarction 03/02/2020 . NSTEMI (non-ST elevated myocardial infarction) ([place]) 02/29/2020 . Hyperlipidemia associated with type 2 diabetes mellitus ([place]) 02/09/2020 . Type 2 diabetes mellitus, controlled ([place]) 01/12/2020 . Class 2 severe obesity with serious comorbidity and body mass index (BMI) of 35.0 to 35.9 in adult, unspecified obesity type ([place]) 12/25/2018 . Obstructive sleep apnea 11/04/2017 . Lumbar stenosis with neurogenic claudication 11/19/2016 . HTN (hypertension) 03/21/2016 . Anxiety  . Depression   Discharged Condition: good  History of Present Illness:  Mrs. [name] is a 79 yo female with DM and Hypertension.  She presented to the hospital with complaints of chest pain.  This was located centrally and radiated under right breast.  This was associated with nausea.  During her workup in the ED she was noted to have ST changes and elevated Troponin levels.  She was placed on a NTG drip with alleviation of her chest pain.  She was admitted for further care.  Hospital Course:   She was taken to the catheterization lab on 03/01/2020.  She was noted to have multivessel CAD with reduced EF 45%.  It was felt coronary bypass grafting would be indicated.  The patient developed confusion and slurred speech.  Code stroke was initiated and she was found to have a small cerebellar stroke.  The patient was evaluated by Dr. Kipp Brood who felt coronary bypass grafting would be indicated.  The risks and benefits of the procedure were  explained to the patient and she was agreeable to proceed.  The patient was stable from her stroke.  It was felt she could proceed to surgery.  She was taken to the operating room and underwent CABG x 5 utilizing LIMA to LAD, SVG to Diagonal, SVG to PLVB, SVG to OM1 and SVG OM 2.  She also underwent endoscopic harvest of greater saphenous vein from right leg.  The patient tolerated the procedure without difficulty and was taken to the SICU in stable condition.  She was extubated the evening of surgery.  During her stay in the SICU the patient did not require inotropic support.  Her arterial line and swan ganz catheter were removed without difficulty.  She had expected post operative blood loss anemia.  Her hemoglobin dropped to 6.7 and she was transfused 1 unit of packed cells.  Her chest tube output decreased and her chest tubes were removed on 03/04/2020.  She has continued to make steady progress.  She has maintained sinus rhythm.  Oxygen has been weaned and she maintains good saturations on room air.  Incisions are noted to be healing well without evidence of infection.  She is tolerating diet without difficulty although has been somewhat slow to progress.  Activity progression is also been somewhat slow but she is making improvements.  She does have a walker at home which she will continue to use to assist with balance.  Renal function is very stable with most recent BUN/creatinine 20/1.14.  Her anemia is very stable at this point.  Most recent hemoglobin hematocrit dated 03/06/2020 are 9.1/27.2 respectively.  At the time of discharge the patient is felt to be quite stable.  Addendum:  Her BP is slowly increasing.  She was started on low dose Cozaar for additional control.  Significant Diagnostic Studies: angiography:    Severe multivessel coronary disease  Segmental proximal to mid 90% LAD stenosis. Distal flow is TIMI grade II. Right coronary supplies LAD via septal perforator collaterals.  90%  mid to distal LAD before the second obtuse marginal. 100% circumflex after the second obtuse marginal.  Total occlusion of distal RCA. Left ventricular branch and PDA fill by collaterals from the left coronary.  Distal left main calcified with 30 to 40% narrowing  Basal inferior to mid inferior wall motion abnormality hypokinetic to akinetic. EF 55%. LVEDP 16 mmHg  Treatments: surgery:   CABG X5.LIMA to LAD, R SVG to OM 2,OM,3 first diagonal and PLV.The OM 3 graft was jumped off the hood of the first diagonal graft Endoscopic greater saphenous vein harvest on theright and left Intra-operative Transesophageal Echocardiogram  Discharge Exam: Blood pressure 131/67, pulse 100, temperature 98.1 F (36.7 C), temperature source Oral, resp. rate 20, height 5' (1.524 m), weight 82.1 kg, SpO2 97 %.  The patient then had a cardiology visit on April 15 Cards visit 4/15; assessment is as below 1. CAD s/p CABG: On Plavix.  Sternotomy scar well-healed.  Otherwise she denies any exertional symptoms since discharge.  She can start on cardiac rehab after she is seen by CV surgery tomorrow.  She has insomnia and loss of appetite since bypass surgery, I suggested she follows up with her PCP.  2. PAF: Continue metoprolol and Eliquis.  3. Obstructive sleep apnea: On CPAP therapy  4. Hypertension: Continue on current therapy.  Obtain CBC and basic metabolic panel  5. Hyperlipidemia: On Lipitor  6. DM2: Managed by primary care provider   Note the patient has good blood pressure control on current medication profile.  Also the patient's blood sugars have been stabilized.  Patient has had a sleep study in the interim showing significant sleep apnea and auto set CPAP has been ordered and is pending.  Recent blood counts showed a tendency towards anemia after open heart surgery.  She also had a chest x-ray showing left pleural effusion.  The patient's greatest complaint today is dyspnea on  exertion and weakness.  The patient also complains of pain in the left shoulder  05/16/2020 The patient was seen in return follow-up for sleep apnea and postoperative left pleural effusion. Note the patient fell recently went to the emergency room and was not found to have any acute injuries. Her CT of the head was normal. Chest x-ray done then  showed no pleural effusion on the left. She still states she is having dyspnea on exertion. She is not wearing her CPAP machine because the mask does not fit well. She is compliant with her diabetic medications on arrival blood sugar is 167 A1c is 6.2. She denies any cough or chest pain. She shows me her diarrhea and most of her a.m. blood sugars are in the 100-1 15 range fasting. She does follow with her primary care provider who is arranging for this patient to see orthopedics for her left shoulder pain.   Past Medical History:  Diagnosis Date  . Anxiety   . Back pain   . Complication of anesthesia 2003   gallbladder surgery-resp. arrest-stop operation and pt. went to ICU - patient stated she has back surgery since then and had no trouble at all  . Depression   . Diabetes ([place])   . Dyspnea   . Gout   . History of kidney stones   . Hypertension   . Leg edema   . Pancreatitis, chronic ([place]) 03/2016  . Pneumonia due to COVID-19 virus 12/03/2019  . Sleep apnea      Family History  Problem Relation Age of Onset  . Dementia Mother   . AAA (abdominal aortic aneurysm) Father   . Breast cancer Maternal Aunt      Social History   Socioeconomic History  . Marital status: Divorced    Spouse name: Not on file  . Number of children: Not on file  . Years of education: Not on file  . Highest education level: Not on file  Occupational History  . Occupation: CNA  Tobacco Use  . Smoking status: Never Smoker  . Smokeless tobacco: Never Used  Vaping Use  . Vaping Use: Never used  Substance and Sexual Activity  . Alcohol use: No    Alcohol/week:  0.0 standard drinks  . Drug use: No  . Sexual activity: Not Currently  Other Topics Concern  . Not on file  Social History Narrative  . Not on file   Social Determinants of Health   Financial Resource Strain:   . Difficulty of Paying Living Expenses:   Food Insecurity:   . Worried About Charity fundraiser in the Last Year:   . Arboriculturist in the Last Year:   Transportation Needs:   . Film/video editor (Medical):   [name] Lack of Transportation (Non-Medical):   Physical Activity:   . Days of Exercise per Week:   . Minutes of Exercise per Session:   Stress:   . Feeling of Stress :   Social Connections:   . Frequency of Communication with Friends and Family:   . Frequency of Social Gatherings with Friends and Family:   . Attends Religious Services:   . Active Member of Clubs or Organizations:   . Attends Archivist Meetings:   [name] Marital Status:   Intimate Partner Violence:   . Fear of Current or Ex-Partner:   . Emotionally Abused:   [name] Physically Abused:   . Sexually Abused:      No Known Allergies   Outpatient Medications Prior to Visit  Medication Sig Dispense Refill  . apixaban (ELIQUIS) 5 MG TABS tablet Take 1 tablet (5 mg total) by mouth 2 (two) times daily. 60 tablet 0  . atorvastatin (LIPITOR) 20 MG tablet Take 1 tablet (20 mg total) by mouth at bedtime. 90 tablet 2  . clopidogrel (PLAVIX) 75 MG tablet Take 1  tablet (75 mg total) by mouth daily. 90 tablet 3  . empagliflozin (JARDIANCE) 10 MG TABS tablet Take 10 mg by mouth daily. 30 tablet 4  . furosemide (LASIX) 20 MG tablet Take 1 tablet (20 mg total) by mouth daily. 90 tablet 3  . glucose blood (ACCU-CHEK AVIVA) test strip Test twice daily 100 each 2  . Lancets (ACCU-CHEK SAFE-T PRO) lancets Test twice daily 100 each 11  . losartan (COZAAR) 25 MG tablet Take 1 tablet (25 mg total) by mouth daily. 30 tablet 3  . metFORMIN (GLUCOPHAGE) 1000 MG tablet Take 1 tablet (1,000 mg total) by mouth 2 (two)  times daily with a meal. 60 tablet 4  . metoprolol tartrate (LOPRESSOR) 25 MG tablet Take 0.5 tablets (12.5 mg total) by mouth 2 (two) times daily. 30 tablet 1  . NON FORMULARY 1 each by Other route See admin instructions. CPAP machine nightly    . sertraline (ZOLOFT) 100 MG tablet Take 100 mg by mouth daily.    . traMADol (ULTRAM) 50 MG tablet Take 1-2 tablets (50-100 mg total) by mouth every 8 (eight) hours as needed. (Patient taking differently: Take 50 mg by mouth 2 (two) times daily. ) 30 tablet 0  . traZODone (DESYREL) 100 MG tablet Take 200 mg by mouth at bedtime.      No facility-administered medications prior to visit.      Review of Systems Constitutional:     weight loss, night sweats,  Fevers, chills, fatigue, lassitude. HEENT:    headaches,  Difficulty swallowing,  Tooth/dental problems,  Sore throat,                No sneezing, itching, ear ache, nasal congestion, post nasal drip,   CV:  No chest pain,  Orthopnea, PND, swelling in lower extremities, anasarca, dizziness, palpitations  GI  No heartburn, indigestion, abdominal pain, nausea, vomiting, diarrhea, change in bowel habits, loss of appetite  Resp:  shortness of breath with exertion or at rest.  No excess mucus, no productive cough,  No non-productive cough,  No coughing up of blood.  No change in color of mucus.  No wheezing.  No chest wall deformity  Skin: no rash or lesions.  GU: no dysuria, change in color of urine, no urgency or frequency.  No flank pain.  MS:  No joint pain or swelling.  No decreased range of motion.  No back pain.  , there is left shoulder pain  Psych:  No change in mood or affect. No depression or anxiety.  No memory loss.     Objective:   Physical Exam  Vitals:   05/17/20 0850  BP: 127/67  Pulse: 67  Temp: 97.7 F (36.5 C)  TempSrc: Temporal  SpO2: 96%  Weight: 166 lb (75.3 kg)  Height: 5' (1.524 m)    Gen: Pleasant, obese , in no distress,  normal affect  ENT: No lesions,   mouth clear,  oropharynx clear, no postnasal drip  Neck: No JVD, no TMG, no carotid bruits  Lungs: No use of accessory muscles, lungs are clear with no evidence of pleural effusion  Cardiovascular: RRR, heart sounds normal, no murmur or gallops, no peripheral edema  Abdomen: soft and NT, no HSM,  BS normal  Musculoskeletal: No deformities, no cyanosis or clubbing No evidence of fracture or deformities of the left shoulder  Neuro: alert, non focal   Skin: Warm, no lesions or rashes   All labs from recent hospitalization are reviewed  BMP Latest Ref Rng & Units 05/11/2020 04/12/2020 03/17/2020  Glucose 70 - 99 mg/dL 129(H) 105(H) 96  BUN 8 - 23 mg/dL '18 15 14  '$ Creatinine 0.44 - 1.00 mg/dL 1.17(H) 1.25(H) 1.08(H)  BUN/Creat Ratio 12 - 28 - 12 13  Sodium 135 - 145 mmol/L 141 141 138  Potassium 3.5 - 5.1 mmol/L 3.9 4.7 4.6  Chloride 98 - 111 mmol/L 100 100 103  CO2 22 - 32 mmol/L '28 23 23  '$ Calcium 8.9 - 10.3 mg/dL 9.4 9.6 9.6   Hepatic Function Latest Ref Rng & Units 04/12/2020 02/29/2020 01/12/2020  Total Protein 6.0 - 8.5 g/dL 6.8 7.1 6.2  Albumin 3.7 - 4.7 g/dL 4.2 4.1 3.8  AST 0 - 40 IU/L '16 20 11  '$ ALT 0 - 32 IU/L '15 23 17  '$ Alk Phosphatase 39 - 117 IU/L 114 75 109  Total Bilirubin 0.0 - 1.2 mg/dL <0.2 0.5 0.2   CBC Latest Ref Rng & Units 05/11/2020 04/12/2020 03/17/2020  WBC 4.0 - 10.5 K/uL 8.1 8.8 14.7(H)  Hemoglobin 12.0 - 15.0 g/dL 13.1 12.4 10.7(L)  Hematocrit 36 - 46 % 41.9 38.8 31.8(L)  Platelets 150 - 400 K/uL 296 317 552(H)       Assessment & Plan:  I personally reviewed all images and lab data in the [facility] system as well as any outside material available during this office visit and agree with the  radiology impressions.   Obstructive sleep apnea Obstructive sleep apnea not well controlled due to lack of compliance using CPAP because of poor mask fit  I will send the patient to the sleep center to see if we get a mask desensitization performed and a proper fit  HTN  (hypertension) Hypertension under good control care per primary  Type 2 diabetes mellitus, controlled ([place]) Type 2 diabetes with improved control hemoglobin A1c of 6.2 at this visit  Patient to continue Jardiance  S/P CABG (coronary artery bypass graft) Coronary artery disease stable status post bypass surgery with normal left ventricular function  Coronary artery disease Coronary artery disease status post bypass surgery per cardiology   [name] was seen today for follow-up.  Diagnoses and all orders for this visit:  Controlled type 2 diabetes mellitus with other circulatory complication, with long-term current use of insulin (HCC) -     Glucose (CBG) -     HgB A1c  Obstructive sleep apnea -     Desensitization mask fit; Future  Essential hypertension  S/P CABG (coronary artery bypass graft)  Coronary artery disease due to lipid rich plaque

## 2020-05-17 ENCOUNTER — Other Ambulatory Visit: Payer: Self-pay

## 2020-05-17 ENCOUNTER — Encounter: Payer: Self-pay | Admitting: Critical Care Medicine

## 2020-05-17 ENCOUNTER — Ambulatory Visit: Payer: Medicare Other | Attending: Critical Care Medicine | Admitting: Critical Care Medicine

## 2020-05-17 VITALS — BP 127/67 | HR 67 | Temp 97.7°F | Ht 60.0 in | Wt 166.0 lb

## 2020-05-17 DIAGNOSIS — I10 Essential (primary) hypertension: Secondary | ICD-10-CM | POA: Diagnosis not present

## 2020-05-17 DIAGNOSIS — E1159 Type 2 diabetes mellitus with other circulatory complications: Secondary | ICD-10-CM

## 2020-05-17 DIAGNOSIS — G4733 Obstructive sleep apnea (adult) (pediatric): Secondary | ICD-10-CM | POA: Diagnosis not present

## 2020-05-17 DIAGNOSIS — Z794 Long term (current) use of insulin: Secondary | ICD-10-CM

## 2020-05-17 DIAGNOSIS — I2510 Atherosclerotic heart disease of native coronary artery without angina pectoris: Secondary | ICD-10-CM | POA: Insufficient documentation

## 2020-05-17 DIAGNOSIS — I2583 Coronary atherosclerosis due to lipid rich plaque: Secondary | ICD-10-CM

## 2020-05-17 DIAGNOSIS — Z951 Presence of aortocoronary bypass graft: Secondary | ICD-10-CM

## 2020-05-17 LAB — GLUCOSE, POCT (MANUAL RESULT ENTRY): POC Glucose: 167 mg/dl — AB (ref 70–99)

## 2020-05-17 LAB — POCT GLYCOSYLATED HEMOGLOBIN (HGB A1C): Hemoglobin A1C: 6.2 % — AB (ref 4.0–5.6)

## 2020-05-17 NOTE — Assessment & Plan Note (Signed)
Hypertension under good control care per primary

## 2020-05-17 NOTE — Assessment & Plan Note (Signed)
Obstructive sleep apnea not well controlled due to lack of compliance using CPAP because of poor mask fit  I will send the patient to the sleep center to see if we get a mask desensitization performed and a proper fit

## 2020-05-17 NOTE — Assessment & Plan Note (Signed)
Type 2 diabetes with improved control hemoglobin A1c of 6.2 at this visit  Patient to continue [facility]

## 2020-05-17 NOTE — Patient Instructions (Signed)
A mask fit will be scheduled at [facility] sleep center. Bring your mask and machine to the appointment  No medication changes

## 2020-05-17 NOTE — Assessment & Plan Note (Signed)
Coronary artery disease stable status post bypass surgery with normal left ventricular function

## 2020-05-17 NOTE — Assessment & Plan Note (Signed)
Coronary artery disease status post bypass surgery per cardiology

## 2020-05-18 ENCOUNTER — Encounter (HOSPITAL_COMMUNITY)
Admission: RE | Admit: 2020-05-18 | Discharge: 2020-05-18 | Disposition: A | Discharge status: Home / Self Care | Payer: Medicare Other | Source: Ambulatory Visit | Attending: Cardiovascular Disease | Admitting: Cardiovascular Disease

## 2020-05-18 DIAGNOSIS — Z951 Presence of aortocoronary bypass graft: Secondary | ICD-10-CM

## 2020-05-18 DIAGNOSIS — I214 Non-ST elevation (NSTEMI) myocardial infarction: Secondary | ICD-10-CM

## 2020-05-19 ENCOUNTER — Other Ambulatory Visit (INDEPENDENT_AMBULATORY_CARE_PROVIDER_SITE_OTHER): Payer: Medicare Other

## 2020-05-19 DIAGNOSIS — I480 Paroxysmal atrial fibrillation: Secondary | ICD-10-CM | POA: Diagnosis not present

## 2020-05-20 ENCOUNTER — Encounter (HOSPITAL_COMMUNITY)
Admission: RE | Admit: 2020-05-20 | Discharge: 2020-05-20 | Disposition: A | Discharge status: Home / Self Care | Payer: Medicare Other | Source: Ambulatory Visit | Attending: Cardiovascular Disease | Admitting: Cardiovascular Disease

## 2020-05-20 ENCOUNTER — Other Ambulatory Visit: Payer: Self-pay

## 2020-05-20 DIAGNOSIS — I214 Non-ST elevation (NSTEMI) myocardial infarction: Secondary | ICD-10-CM

## 2020-05-20 DIAGNOSIS — Z951 Presence of aortocoronary bypass graft: Secondary | ICD-10-CM

## 2020-05-23 ENCOUNTER — Encounter (HOSPITAL_COMMUNITY)
Admission: RE | Admit: 2020-05-23 | Discharge: 2020-05-23 | Disposition: A | Discharge status: Home / Self Care | Payer: Medicare Other | Source: Ambulatory Visit | Attending: Cardiovascular Disease | Admitting: Cardiovascular Disease

## 2020-05-23 ENCOUNTER — Other Ambulatory Visit: Payer: Self-pay

## 2020-05-23 DIAGNOSIS — Z951 Presence of aortocoronary bypass graft: Secondary | ICD-10-CM

## 2020-05-23 DIAGNOSIS — I214 Non-ST elevation (NSTEMI) myocardial infarction: Secondary | ICD-10-CM

## 2020-05-24 NOTE — Progress Notes (Signed)
Cardiac Individual Treatment Plan  Patient Details  Name: Judy [name] MRN: [id number] Date of Birth: [birth year] Referring Provider:     CARDIAC REHAB PHASE II ORIENTATION from 04/19/2020 in [facility]  Referring Provider O'Neal, Cassie Freer, MD      Initial Encounter Date:    CARDIAC REHAB PHASE II ORIENTATION from 04/19/2020 in [facility]  Date 04/19/20      Visit Diagnosis: NSTEMI (non-ST elevated myocardial infarction) ([place])  S/P CABG x 5  Patient's Home Medications on Admission:  Current Outpatient Medications:  .  apixaban (ELIQUIS) 5 MG TABS tablet, Take 1 tablet (5 mg total) by mouth 2 (two) times daily., Disp: 60 tablet, Rfl: 0 .  atorvastatin (LIPITOR) 20 MG tablet, Take 1 tablet (20 mg total) by mouth at bedtime., Disp: 90 tablet, Rfl: 2 .  clopidogrel (PLAVIX) 75 MG tablet, Take 1 tablet (75 mg total) by mouth daily., Disp: 90 tablet, Rfl: 3 .  empagliflozin (JARDIANCE) 10 MG TABS tablet, Take 10 mg by mouth daily., Disp: 30 tablet, Rfl: 4 .  furosemide (LASIX) 20 MG tablet, Take 1 tablet (20 mg total) by mouth daily., Disp: 90 tablet, Rfl: 3 .  glucose blood (ACCU-CHEK AVIVA) test strip, Test twice daily, Disp: 100 each, Rfl: 2 .  Lancets (ACCU-CHEK SAFE-T PRO) lancets, Test twice daily, Disp: 100 each, Rfl: 11 .  losartan (COZAAR) 25 MG tablet, Take 1 tablet (25 mg total) by mouth daily., Disp: 30 tablet, Rfl: 3 .  metFORMIN (GLUCOPHAGE) 1000 MG tablet, Take 1 tablet (1,000 mg total) by mouth 2 (two) times daily with a meal., Disp: 60 tablet, Rfl: 4 .  metoprolol tartrate (LOPRESSOR) 25 MG tablet, Take 0.5 tablets (12.5 mg total) by mouth 2 (two) times daily., Disp: 30 tablet, Rfl: 1 .  NON FORMULARY, 1 each by Other route See admin instructions. CPAP machine nightly, Disp: , Rfl:  .  sertraline (ZOLOFT) 100 MG tablet, Take 100 mg by mouth daily., Disp: , Rfl:  .  traMADol (ULTRAM) 50 MG tablet, Take  1-2 tablets (50-100 mg total) by mouth every 8 (eight) hours as needed. (Patient taking differently: Take 50 mg by mouth 2 (two) times daily. ), Disp: 30 tablet, Rfl: 0 .  traZODone (DESYREL) 100 MG tablet, Take 200 mg by mouth at bedtime. , Disp: , Rfl:   Past Medical History: Past Medical History:  Diagnosis Date  . Anxiety   . Back pain   . Complication of anesthesia 2003   gallbladder surgery-resp. arrest-stop operation and pt. went to ICU - patient stated she has back surgery since then and had no trouble at all  . Depression   . Diabetes ([place])   . Dyspnea   . Gout   . History of kidney stones   . Hypertension   . Leg edema   . Pancreatitis, chronic ([place]) 03/2016  . Pneumonia due to COVID-19 virus 12/03/2019  . Sleep apnea     Tobacco Use: Social History   Tobacco Use  Smoking Status Never Smoker  Smokeless Tobacco Never Used    Labs: Recent Review Flowsheet Data    Labs for ITP Cardiac and Pulmonary Rehab Latest Ref Rng & Units 03/03/2020 03/03/2020 03/03/2020 03/03/2020 05/17/2020   Cholestrol 0 - 200 mg/dL - - - - -   LDLCALC 0 - 99 mg/dL - - - - -   HDL >40 mg/dL - - - - -   Trlycerides <150 mg/dL - - - - -  Hemoglobin A1c 4.0 - 5.6 % - - - - 6.2(A)   PHART 7.35 - 7.45 - 7.344(L) 7.320(L) 7.305(L) -   PCO2ART 32 - 48 mmHg - 40.9 37.0 38.9 -   HCO3 20.0 - 28.0 mmol/L - 22.4 19.2(L) 19.4(L) -   TCO2 22 - 32 mmol/L 30 24 20(L) 21(L) -   ACIDBASEDEF 0.0 - 2.0 mmol/L - 3.0(H) 6.0(H) 6.0(H) -   O2SAT % - 89.0 96.0 93.0 -      Capillary Blood Glucose: Lab Results  Component Value Date   GLUCAP 132 (H) 05/09/2020   GLUCAP 135 (H) 04/29/2020   GLUCAP 173 (H) 04/29/2020   GLUCAP 116 (H) 04/27/2020   GLUCAP 162 (H) 04/27/2020     Exercise Target Goals: Exercise Program Goal: Individual exercise prescription set using results from initial 6 min walk test and THRR while considering  patient's activity barriers and safety.   Exercise Prescription Goal: Initial  exercise prescription builds to 30-45 minutes a day of aerobic activity, 2-3 days per week.  Home exercise guidelines will be given to patient during program as part of exercise prescription that the participant will acknowledge.  Activity Barriers & Risk Stratification:  Activity Barriers & Cardiac Risk Stratification - 04/19/20 1420      Activity Barriers & Cardiac Risk Stratification   Activity Barriers Assistive Device;Other (comment);Balance Concerns;History of Falls    Comments Occassional left shoulder/arm pain, unknown etiology.    Cardiac Risk Stratification High           6 Minute Walk:  6 Minute Walk    Row Name 04/19/20 1430         6 Minute Walk   Phase Initial     Distance 806 feet     Walk Time 6 minutes     # of Rest Breaks 2     MPH 1.53     METS 1.33     RPE 13     Perceived Dyspnea  3     VO2 Peak 4.66     Symptoms Yes (comment)     Comments Two seated rest breaks secondary to SOB. Patient c/o burning, bilateral hip discomfort radiating down thigh.     Resting HR 92 bpm     Resting BP 104/62     Resting Oxygen Saturation  96 %     Exercise Oxygen Saturation  during 6 min walk 97 %     Max Ex. HR 106 bpm     Max Ex. BP 130/62     2 Minute Post BP 116/50            Oxygen Initial Assessment:   Oxygen Re-Evaluation:   Oxygen Discharge (Final Oxygen Re-Evaluation):   Initial Exercise Prescription:  Initial Exercise Prescription - 04/19/20 1500      Date of Initial Exercise RX and Referring Provider   Date 04/19/20    Referring Provider Geralynn Rile, MD    Expected Discharge Date 06/20/20      T5 Nustep   Level 1    SPM 85    Minutes 15    METs 1.9      Track   Laps 8    Minutes 15    METs 1.93      Prescription Details   Frequency (times per week) 3    Duration Progress to 30 minutes of continuous aerobic without signs/symptoms of physical distress      Intensity   THRR 40-80% of Max Heartrate  57-114    Ratings of  Perceived Exertion 11-13    Perceived Dyspnea 0-4      Progression   Progression Continue to progress workloads to maintain intensity without signs/symptoms of physical distress.      Resistance Training   Training Prescription Yes    Weight 2lbs    Reps 10-15           Perform Capillary Blood Glucose checks as needed.  Exercise Prescription Changes:   Exercise Prescription Changes    Row Name 04/27/20 1341 04/29/20 1347 05/16/20 0845 05/16/20 1600       Response to Exercise   Blood Pressure (Admit) 94/42 114/72 -- 112/64    Blood Pressure (Exercise) 138/72 116/60 -- 112/56    Blood Pressure (Exit) 122/48 124/78 -- 110/60    Heart Rate (Admit) 70 bpm 94 bpm -- 88 bpm    Heart Rate (Exercise) 116 bpm 124 bpm -- 96 bpm    Heart Rate (Exit) 69 bpm 68 bpm -- 88 bpm    Oxygen Saturation (Exercise) 98 % -- -- 96 %    Rating of Perceived Exertion (Exercise) 14 12 -- 13    Perceived Dyspnea (Exercise) 1 0 -- 3    Symptoms Patient c/o mild SOB walking the track. -- -- SOB, Fatigue  SOB, Fatigue    Comments First exercise session tolerated fair w/ some SOB. -- -- Recent fall/ER visit. Pt voices she has lost ground.     Duration Progress to 30 minutes of  aerobic without signs/symptoms of physical distress Progress to 30 minutes of  aerobic without signs/symptoms of physical distress -- Progress to 30 minutes of  aerobic without signs/symptoms of physical distress    Intensity THRR unchanged THRR unchanged -- THRR unchanged      Progression   Progression Continue to progress workloads to maintain intensity without signs/symptoms of physical distress. Continue to progress workloads to maintain intensity without signs/symptoms of physical distress. -- Continue to progress workloads to maintain intensity without signs/symptoms of physical distress.    Average METs 1.8 2.1 -- 2      Resistance Training   Training Prescription No Yes -- No    Weight -- 2lbs -- Weights held today    Reps  -- 10-15 -- --    Time -- 10 Minutes -- --      Interval Training   Interval Training No No -- No      T5 Nustep   Level 1 1 -- 1    SPM 85 85 -- 70    Minutes 15 15 -- 15    METs 1.8 1.8 -- 2.1      Track   Laps 8 9 -- 5    Minutes 15 15 -- 15    METs 1.93 2.04 -- 1.87           Exercise Comments:   Exercise Comments    Row Name 04/27/20 1430 05/04/20 1641 05/16/20 1600 05/23/20 1600     Exercise Comments Patient tolerated low intensity exercise fair with some SOB. Multiple seated rest breaks taken during walking station. Patient absent today, unable to review METs and goals. Pt had fall last week and went to ER. Pt feels she has lost ground since the fall. No change to MET level today as patinet had fatigue and SOB with activity. Will monitor pt this week and attempt to incaease workloads if appropriate next week. Pt feeling better but still has SOB. Attempted to increase  to level 2 on nustep but patient was unable to tolerate increase. She was placed back at level 1. Will continue to monitor progress and adjust workloads as tolerated.           Exercise Goals and Review:   Exercise Goals    Row Name 04/19/20 1421             Exercise Goals   Increase Physical Activity Yes       Intervention Provide advice, education, support and counseling about physical activity/exercise needs.;Develop an individualized exercise prescription for aerobic and resistive training based on initial evaluation findings, risk stratification, comorbidities and participant's personal goals.       Expected Outcomes Short Term: Attend rehab on a regular basis to increase amount of physical activity.;Long Term: Exercising regularly at least 3-5 days a week.;Long Term: Add in home exercise to make exercise part of routine and to increase amount of physical activity.       Increase Strength and Stamina Yes       Intervention Provide advice, education, support and counseling about physical  activity/exercise needs.;Develop an individualized exercise prescription for aerobic and resistive training based on initial evaluation findings, risk stratification, comorbidities and participant's personal goals.       Expected Outcomes Short Term: Increase workloads from initial exercise prescription for resistance, speed, and METs.;Short Term: Perform resistance training exercises routinely during rehab and add in resistance training at home;Long Term: Improve cardiorespiratory fitness, muscular endurance and strength as measured by increased METs and functional capacity (6MWT)       Able to understand and use rate of perceived exertion (RPE) scale Yes       Intervention Provide education and explanation on how to use RPE scale       Expected Outcomes Short Term: Able to use RPE daily in rehab to express subjective intensity level;Long Term:  Able to use RPE to guide intensity level when exercising independently       Knowledge and understanding of Target Heart Rate Range (THRR) Yes       Intervention Provide education and explanation of THRR including how the numbers were predicted and where they are located for reference       Expected Outcomes Short Term: Able to state/look up THRR;Long Term: Able to use THRR to govern intensity when exercising independently;Short Term: Able to use daily as guideline for intensity in rehab       Able to check pulse independently Yes       Intervention Provide education and demonstration on how to check pulse in carotid and radial arteries.;Review the importance of being able to check your own pulse for safety during independent exercise       Expected Outcomes Short Term: Able to explain why pulse checking is important during independent exercise;Long Term: Able to check pulse independently and accurately       Understanding of Exercise Prescription Yes       Intervention Provide education, explanation, and written materials on patient's individual exercise  prescription       Expected Outcomes Short Term: Able to explain program exercise prescription;Long Term: Able to explain home exercise prescription to exercise independently              Exercise Goals Re-Evaluation :  Exercise Goals Re-Evaluation    Row Name 04/27/20 1430 05/04/20 1641           Exercise Goal Re-Evaluation   Exercise Goals Review Increase Physical Activity;Able to understand and  use rate of perceived exertion (RPE) scale Increase Physical Activity;Able to understand and use rate of perceived exertion (RPE) scale      Comments Patient able to understand and use RPE scale appropriately. Patient absent today, unable to review METs and goals.      Expected Outcomes Progress workloads as tolerated to help improve cardiorepsiratory fitness. Will review upon return to exercise.             Discharge Exercise Prescription (Final Exercise Prescription Changes):  Exercise Prescription Changes - 05/16/20 1600      Response to Exercise   Blood Pressure (Admit) 112/64    Blood Pressure (Exercise) 112/56    Blood Pressure (Exit) 110/60    Heart Rate (Admit) 88 bpm    Heart Rate (Exercise) 96 bpm    Heart Rate (Exit) 88 bpm    Oxygen Saturation (Exercise) 96 %    Rating of Perceived Exertion (Exercise) 13    Perceived Dyspnea (Exercise) 3    Symptoms SOB, Fatigue   SOB, Fatigue   Comments Recent fall/ER visit. Pt voices she has lost ground.     Duration Progress to 30 minutes of  aerobic without signs/symptoms of physical distress    Intensity THRR unchanged      Progression   Progression Continue to progress workloads to maintain intensity without signs/symptoms of physical distress.    Average METs 2      Resistance Training   Training Prescription No    Weight Weights held today      Interval Training   Interval Training No      T5 Nustep   Level 1    SPM 70    Minutes 15    METs 2.1      Track   Laps 5    Minutes 15    METs 1.87            Nutrition:  Target Goals: Understanding of nutrition guidelines, daily intake of sodium <1545m, cholesterol <2017m calories 30% from fat and 7% or less from saturated fats, daily to have 5 or more servings of fruits and vegetables.  Biometrics:  Pre Biometrics - 04/19/20 1424      Pre Biometrics   Waist Circumference 37.75 inches    Hip Circumference 45 inches    Waist to Hip Ratio 0.84 %    Triceps Skinfold 29.5 mm    % Body Fat 44.3 %    Grip Strength 22.5 kg    Flexibility 0 in    Single Leg Stand 5.31 seconds            Nutrition Therapy Plan and Nutrition Goals:  Nutrition Therapy & Goals - 05/13/20 1130      Nutrition Therapy   Diet Heart Healthy/Carb modified      Personal Nutrition Goals   Nutrition Goal Pt to build a healthy plate including vegetables, fruits, whole grains, and low-fat dairy products in a heart healthy meal plan    Personal Goal #2 Improved blood glucose control as evidenced by next A1C below 7.0.    Personal Goal #3 Pt able to name foods that affect blood glucose      Intervention Plan   Intervention Prescribe, educate and counsel regarding individualized specific dietary modifications aiming towards targeted core components such as weight, hypertension, lipid management, diabetes, heart failure and other comorbidities.;Nutrition handout(s) given to patient.    Expected Outcomes Short Term Goal: A plan has been developed with personal nutrition goals set during  dietitian appointment.           Nutrition Assessments:  Nutrition Assessments - 04/22/20 1013      MEDFICTS Scores   Pre Score 77           Nutrition Goals Re-Evaluation:  Nutrition Goals Re-Evaluation    [place] Name 05/13/20 1131             Goals   Current Weight 172 lb (78 kg)       Nutrition Goal Pt to build a healthy plate including vegetables, fruits, whole grains, and low-fat dairy products in a heart healthy meal plan         Personal Goal #2 Re-Evaluation    Personal Goal #2 Improved blood glucose control as evidenced by next A1C below 7.0.              Nutrition Goals Re-Evaluation:  Nutrition Goals Re-Evaluation    [place] Name 05/13/20 1131             Goals   Current Weight 172 lb (78 kg)       Nutrition Goal Pt to build a healthy plate including vegetables, fruits, whole grains, and low-fat dairy products in a heart healthy meal plan         Personal Goal #2 Re-Evaluation   Personal Goal #2 Improved blood glucose control as evidenced by next A1C below 7.0.              Nutrition Goals Discharge (Final Nutrition Goals Re-Evaluation):  Nutrition Goals Re-Evaluation - 05/13/20 1131      Goals   Current Weight 172 lb (78 kg)    Nutrition Goal Pt to build a healthy plate including vegetables, fruits, whole grains, and low-fat dairy products in a heart healthy meal plan      Personal Goal #2 Re-Evaluation   Personal Goal #2 Improved blood glucose control as evidenced by next A1C below 7.0.           Psychosocial: Target Goals: Acknowledge presence or absence of significant depression and/or stress, maximize coping skills, provide positive support system. Participant is able to verbalize types and ability to use techniques and skills needed for reducing stress and depression.  Initial Review & Psychosocial Screening:  Initial Psych Review & Screening - 04/19/20 1611      Initial Review   Current issues with History of Depression      Family Dynamics   Good Support System? Yes    Comments Judy [name] denies psychosocial barriers to self health management or needs. 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 She has since had a stroke, MI, and CABG x 5. Prior to her Covid dx, her son was not involved in her medical care. He is now taking initiative along with a "close friend" who brought her to her appointment today. She lives with a roommate who is also supportive. She enjoys sewing  as a hobbie. PHQ9 score 5 however patient states this does not prevent her from completing ADLs or having meaningful relationships. No interventions needed at this time.      Barriers   Psychosocial barriers to participate in program There are no identifiable barriers or psychosocial needs.      Screening Interventions   Interventions Encouraged to exercise;To provide support and resources with identified psychosocial needs;Provide feedback about the scores to participant           Quality of Life Scores:  Quality of Life - 04/27/20 1547      Quality of Life   Select Quality of Life      Quality of Life Scores   Health/Function Pre 18.5 %    Socioeconomic Pre 17.25 %    Psych/Spiritual Pre 19.5 %    Family Pre 28.5 %    GLOBAL Pre 19.66 %          Scores of 19 and below usually indicate a poorer quality of life in these areas.  A difference of  2-3 points is a clinically meaningful difference.  A difference of 2-3 points in the total score of the Quality of Life Index has been associated with significant improvement in overall quality of life, self-image, physical symptoms, and general health in studies assessing change in quality of life.  PHQ-9: Recent Review Flowsheet Data    Depression screen [facility] 2/9 05/17/2020 04/19/2020 04/04/2020 03/24/2020 03/18/2020   Decreased Interest 2 1 - 1 1   Down, Depressed, Hopeless 2 1 - 1 1   PHQ - 2 Score 4 2 - 2 2   Altered sleeping 3 1 - 1 1   Tired, decreased energy 2 1 - 1 1   Change in appetite 3 1 - 1 1   Feeling bad or failure about yourself  1 0 - 0 0   Trouble concentrating 0 0 - 0 0   Moving slowly or fidgety/restless 1 0 - 0 0   Suicidal thoughts 0 0 0 0 0   PHQ-9 Score 14 5 - 5 5   Difficult doing work/chores - Not difficult at all - Somewhat difficult Somewhat difficult     Interpretation of Total Score  Total Score Depression Severity:  1-4 = Minimal depression, 5-9 = Mild depression, 10-14 = Moderate depression, 15-19 =  Moderately severe depression, 20-27 = Severe depression   Psychosocial Evaluation and Intervention:  Psychosocial Evaluation - 05/04/20 1521      Psychosocial Evaluation & Interventions   Interventions Encouraged to exercise with the program and follow exercise prescription    Continue Psychosocial Services  --           Psychosocial Re-Evaluation:  Psychosocial Re-Evaluation    [place] Name 05/04/20 1522 05/24/20 1404           Psychosocial Re-Evaluation   Current issues with History of Depression --      Comments Judy [name] denies psychosocial barriers to self health management or needs. 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 She has since had a stroke, MI, and CABG x 5. Prior to her Covid dx, her son was not involved in her medical care. He is now taking initiative along with a "close friend" who brought her to her appointment today. She lives with a roommate who is also supportive. She enjoys sewing as a hobbie. PHQ9 score 5 however patient states this does not prevent her from completing ADLs or having meaningful relationships. No interventions needed at this time. Judy [name] denies psychosocial barriers to self health management or needs. 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 She has since had a stroke, MI, and CABG x 5. Prior to her Covid dx, her son was not involved in her medical care. He is now taking initiative along with a "close friend" who brought her to her appointment today. She lives with a roommate who is also  supportive. She enjoys sewing as a hobbie. PHQ9 score 5 however patient states this does not prevent her from completing ADLs or having meaningful relationships. No interventions needed at this time.      Expected Outcomes Judy [name] will continue to have a positive attitude and outlook. She will report improved PHQ 9 score by end of CR enrollment. Judy [name] will continue to have  a positive attitude and outlook. She will report improved PHQ 9 score by end of CR enrollment.      Interventions Encouraged to attend Cardiac Rehabilitation for the exercise Encouraged to attend Cardiac Rehabilitation for the exercise      Continue Psychosocial Services  Follow up required by staff Follow up required by staff             Psychosocial Discharge (Final Psychosocial Re-Evaluation):  Psychosocial Re-Evaluation - 05/24/20 1404      Psychosocial Re-Evaluation   Comments Judy [name] denies psychosocial barriers to self health management or needs. 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 She has since had a stroke, MI, and CABG x 5. Prior to her Covid dx, her son was not involved in her medical care. He is now taking initiative along with a "close friend" who brought her to her appointment today. She lives with a roommate who is also supportive. She enjoys sewing as a hobbie. PHQ9 score 5 however patient states this does not prevent her from completing ADLs or having meaningful relationships. No interventions needed at this time.    Expected Outcomes Judy [name] will continue to have a positive attitude and outlook. She will report improved PHQ 9 score by end of CR enrollment.    Interventions Encouraged to attend Cardiac Rehabilitation for the exercise    Continue Psychosocial Services  Follow up required by staff           Vocational Rehabilitation: Provide vocational rehab assistance to qualifying candidates.   Vocational Rehab Evaluation & Intervention:   Education: Education Goals: Education classes will be provided on a weekly basis, covering required topics. Participant will state understanding/return demonstration of topics presented.  Learning Barriers/Preferences:   Education Topics: Count Your Pulse:  -Group instruction provided by verbal instruction, demonstration, patient participation and written materials to  support subject.  Instructors address importance of being able to find your pulse and how to count your pulse when at home without a heart monitor.  Patients get hands on experience counting their pulse with staff help and individually.   Heart Attack, Angina, and Risk Factor Modification:  -Group instruction provided by verbal instruction, video, and written materials to support subject.  Instructors address signs and symptoms of angina and heart attacks.    Also discuss risk factors for heart disease and how to make changes to improve heart health risk factors.   Functional Fitness:  -Group instruction provided by verbal instruction, demonstration, patient participation, and written materials to support subject.  Instructors address safety measures for doing things around the house.  Discuss how to get up and down off the floor, how to pick things up properly, how to safely get out of a chair without assistance, and balance training.   Meditation and Mindfulness:  -Group instruction provided by verbal instruction, patient participation, and written materials to support subject.  Instructor addresses importance of mindfulness and meditation practice to help reduce stress and improve awareness.  Instructor also leads participants through a meditation exercise.  Stretching for Flexibility and Mobility:  -Group instruction provided by verbal instruction, patient participation, and written materials to support subject.  Instructors lead participants through series of stretches that are designed to increase flexibility thus improving mobility.  These stretches are additional exercise for major muscle groups that are typically performed during regular warm up and cool down.   Hands Only CPR:  -Group verbal, video, and participation provides a basic overview of AHA guidelines for community CPR. Role-play of emergencies allow participants the opportunity to practice calling for help and chest  compression technique with discussion of AED use.   Hypertension: -Group verbal and written instruction that provides a basic overview of hypertension including the most recent diagnostic guidelines, risk factor reduction with self-care instructions and medication management.    Nutrition I class: Heart Healthy Eating:  -Group instruction provided by PowerPoint slides, verbal discussion, and written materials to support subject matter. The instructor gives an explanation and review of the Therapeutic Lifestyle Changes diet recommendations, which includes a discussion on lipid goals, dietary fat, sodium, fiber, plant stanol/sterol esters, sugar, and the components of a well-balanced, healthy diet.   Nutrition II class: Lifestyle Skills:  -Group instruction provided by PowerPoint slides, verbal discussion, and written materials to support subject matter. The instructor gives an explanation and review of label reading, grocery shopping for heart health, heart healthy recipe modifications, and ways to make healthier choices when eating out.   Diabetes Question & Answer:  -Group instruction provided by PowerPoint slides, verbal discussion, and written materials to support subject matter. The instructor gives an explanation and review of diabetes co-morbidities, pre- and post-prandial blood glucose goals, pre-exercise blood glucose goals, signs, symptoms, and treatment of hypoglycemia and hyperglycemia, and foot care basics.   Diabetes Blitz:  -Group instruction provided by PowerPoint slides, verbal discussion, and written materials to support subject matter. The instructor gives an explanation and review of the physiology behind type 1 and type 2 diabetes, diabetes medications and rational behind using different medications, pre- and post-prandial blood glucose recommendations and Hemoglobin A1c goals, diabetes diet, and exercise including blood glucose guidelines for exercising safely.    Portion  Distortion:  -Group instruction provided by PowerPoint slides, verbal discussion, written materials, and food models to support subject matter. The instructor gives an explanation of serving size versus portion size, changes in portions sizes over the last 20 years, and what consists of a serving from each food group.   Stress Management:  -Group instruction provided by verbal instruction, video, and written materials to support subject matter.  Instructors review role of stress in heart disease and how to cope with stress positively.     Exercising on Your Own:  -Group instruction provided by verbal instruction, power point, and written materials to support subject.  Instructors discuss benefits of exercise, components of exercise, frequency and intensity of exercise, and end points for exercise.  Also discuss use of nitroglycerin and activating EMS.  Review options of places to exercise outside of rehab.  Review guidelines for sex with heart disease.   Cardiac Drugs I:  -Group instruction provided by verbal instruction and written materials to support subject.  Instructor reviews cardiac drug classes: antiplatelets, anticoagulants, beta blockers, and statins.  Instructor discusses reasons, side effects, and lifestyle considerations for each drug class.   Cardiac Drugs II:  -Group instruction provided by verbal instruction and written materials to support subject.  Instructor reviews cardiac drug classes: angiotensin converting enzyme inhibitors (ACE-I), angiotensin II receptor blockers (ARBs), nitrates,  and calcium channel blockers.  Instructor discusses reasons, side effects, and lifestyle considerations for each drug class.   Anatomy and Physiology of the Circulatory System:  Group verbal and written instruction and models provide basic cardiac anatomy and physiology, with the coronary electrical and arterial systems. Review of: AMI, Angina, Valve disease, Heart Failure, Peripheral Artery  Disease, Cardiac Arrhythmia, Pacemakers, and the ICD.   Other Education:  -Group or individual verbal, written, or video instructions that support the educational goals of the cardiac rehab program.   Holiday Eating Survival Tips:  -Group instruction provided by PowerPoint slides, verbal discussion, and written materials to support subject matter. The instructor gives patients tips, tricks, and techniques to help them not only survive but enjoy the holidays despite the onslaught of food that accompanies the holidays.   Knowledge Questionnaire Score:  Knowledge Questionnaire Score - 04/19/20 1604      Knowledge Questionnaire Score   Pre Score 19/24           Core Components/Risk Factors/Patient Goals at Admission:  Personal Goals and Risk Factors at Admission - 04/19/20 1605      Core Components/Risk Factors/Patient Goals on Admission    Weight Management Yes;Obesity    Intervention Weight Management/Obesity: Establish reasonable short term and long term weight goals.;Obesity: Provide education and appropriate resources to help participant work on and attain dietary goals.    Admit Weight 166 lb 14.2 oz (75.7 kg)    Expected Outcomes Short Term: Continue to assess and modify interventions until short term weight is achieved;Long Term: Adherence to nutrition and physical activity/exercise program aimed toward attainment of established weight goal;Weight Loss: Understanding of general recommendations for a balanced deficit meal plan, which promotes 1-2 lb weight loss per week and includes a negative energy balance of [id number] kcal/d;Understanding recommendations for meals to include 15-35% energy as protein, 25-35% energy from fat, 35-60% energy from carbohydrates, less than 261m of dietary cholesterol, 20-35 gm of total fiber daily;Understanding of distribution of calorie intake throughout the day with the consumption of 4-5 meals/snacks    Diabetes Yes    Intervention Provide education  about signs/symptoms and action to take for hypo/hyperglycemia.;Provide education about proper nutrition, including hydration, and aerobic/resistive exercise prescription along with prescribed medications to achieve blood glucose in normal ranges: Fasting glucose 65-99 mg/dL    Expected Outcomes Short Term: Participant verbalizes understanding of the signs/symptoms and immediate care of hyper/hypoglycemia, proper foot care and importance of medication, aerobic/resistive exercise and nutrition plan for blood glucose control.;Long Term: Attainment of HbA1C < 7%.    Hypertension Yes    Intervention Provide education on lifestyle modifcations including regular physical activity/exercise, weight management, moderate sodium restriction and increased consumption of fresh fruit, vegetables, and low fat dairy, alcohol moderation, and smoking cessation.;Monitor prescription use compliance.    Expected Outcomes Short Term: Continued assessment and intervention until BP is < 140/966mHG in hypertensive participants. < 130/8059mG in hypertensive participants with diabetes, heart failure or chronic kidney disease.;Long Term: Maintenance of blood pressure at goal levels.    Lipids Yes    Intervention Provide education and support for participant on nutrition & aerobic/resistive exercise along with prescribed medications to achieve LDL <48m86mDL >40mg58m Expected Outcomes Short Term: Participant states understanding of desired cholesterol values and is compliant with medications prescribed. Participant is following exercise prescription and nutrition guidelines.;Long Term: Cholesterol controlled with medications as prescribed, with individualized exercise RX and with personalized nutrition plan. Value goals: LDL < 48mg,76m >  40 mg.           Core Components/Risk Factors/Patient Goals Review:   Goals and Risk Factor Review    Row Name 04/27/20 1437 05/24/20 1405           Core Components/Risk Factors/Patient  Goals Review   Personal Goals Review Weight Management/Obesity;Hypertension;Diabetes;Lipids Weight Management/Obesity;Hypertension;Diabetes;Lipids      Review Judy [name] started exercise on 04/27/20 and did well with exercise. Judy [name] continues to work hard to improve her CAD risk factors. She is taking medications as prescribed and has ongoing engagements with RD for diet modifications.      Expected Outcomes Judy [name] will continue to particpate in phase 2 cardiac rehab for exercise, nutrition and lifestyle modifications Judy [name] will continue to particpate in phase 2 cardiac rehab for exercise, nutrition and lifestyle modifications             Core Components/Risk Factors/Patient Goals at Discharge (Final Review):   Goals and Risk Factor Review - 05/24/20 1405      Core Components/Risk Factors/Patient Goals Review   Personal Goals Review Weight Management/Obesity;Hypertension;Diabetes;Lipids    Review Dealie continues to work hard to improve her CAD risk factors. She is taking medications as prescribed and has ongoing engagements with RD for diet modifications.    Expected Outcomes Judy [name] will continue to particpate in phase 2 cardiac rehab for exercise, nutrition and lifestyle modifications           ITP Comments:  ITP Comments    Row Name 04/19/20 1409 05/04/20 1516 05/24/20 1400       ITP Comments Dr. Fransico Him Medical Director Cardiac Rehab Judy [name] 30 day ITP review: Judy [name] has completed 2 CR exercise sessions since admission. Today she was absent secondary to follow up with cardiologist. She worked to an RPE of 11-14 and complained of SOB with ambulation secondary to mask wearing. States she does not have SOB at home when exercising without a mask. VSS during exercise and no other complaints noted. It is too early in patients CR enrollment to assess for progression towards personal goals of improved ambulation without falls. 30 day ITP review: Judy [name] has completed 8 CR exercise  sessions since admission. Today she states she is beginning to feel better and her energy is improving. She brags about being able to put on her own socks today without a lot of difficulty. This is a great accomplishment for her as she has not been able to put on her own socks since hospital discharge. She works to an RPE of 11-14 and complains of SOB with ambulation. This has also been ongoing since Covid Dx, stroke and MI. VSS during exercise and no other complaints noted. It is noted patient recently fell off porch and required ED admission for eval. Also noted her BNP was elevated during this encounter and daily lasix was prescribed. She is compliant with medications.            Comments: see ITP comments

## 2020-05-25 ENCOUNTER — Other Ambulatory Visit: Payer: Self-pay

## 2020-05-25 ENCOUNTER — Encounter (HOSPITAL_COMMUNITY)
Admission: RE | Admit: 2020-05-25 | Discharge: 2020-05-25 | Disposition: A | Discharge status: Home / Self Care | Payer: Medicare Other | Source: Ambulatory Visit | Attending: Cardiovascular Disease | Admitting: Cardiovascular Disease

## 2020-05-25 VITALS — Ht 60.0 in | Wt 165.6 lb

## 2020-05-25 DIAGNOSIS — Z951 Presence of aortocoronary bypass graft: Secondary | ICD-10-CM

## 2020-05-25 DIAGNOSIS — I214 Non-ST elevation (NSTEMI) myocardial infarction: Secondary | ICD-10-CM

## 2020-05-26 ENCOUNTER — Other Ambulatory Visit: Payer: Self-pay | Admitting: Critical Care Medicine

## 2020-05-27 ENCOUNTER — Encounter (HOSPITAL_COMMUNITY)
Admission: RE | Admit: 2020-05-27 | Discharge: 2020-05-27 | Disposition: A | Discharge status: Home / Self Care | Payer: Medicare Other | Source: Ambulatory Visit | Attending: Cardiovascular Disease | Admitting: Cardiovascular Disease

## 2020-05-27 ENCOUNTER — Other Ambulatory Visit: Payer: Self-pay

## 2020-05-27 DIAGNOSIS — Z951 Presence of aortocoronary bypass graft: Secondary | ICD-10-CM

## 2020-05-27 DIAGNOSIS — I214 Non-ST elevation (NSTEMI) myocardial infarction: Secondary | ICD-10-CM

## 2020-05-27 NOTE — Progress Notes (Signed)
Nutrition Note  Spoke with pt. Reviewed latest A1C of 6.2%.  We reviewed glycemic targets. Discussed heart healthy changes to her diet. Recommended following Plate method. She reviewed different food categories and identified foods she would like. She typically just eats a meat for dinner. We talked about adding frozen or canned veggies. She is amenable. She has no questions at this time. Provided handouts for reinforcement.  Will continue to monitor pt during her time in cardiac rehab.  Michaele Offer, MS, RDN, LDN

## 2020-05-30 ENCOUNTER — Other Ambulatory Visit: Payer: Self-pay

## 2020-05-30 ENCOUNTER — Encounter (HOSPITAL_COMMUNITY)
Admission: RE | Admit: 2020-05-30 | Discharge: 2020-05-30 | Disposition: A | Discharge status: Home / Self Care | Payer: Medicare Other | Source: Ambulatory Visit | Attending: Cardiovascular Disease | Admitting: Cardiovascular Disease

## 2020-05-30 DIAGNOSIS — Z951 Presence of aortocoronary bypass graft: Secondary | ICD-10-CM

## 2020-05-30 DIAGNOSIS — I214 Non-ST elevation (NSTEMI) myocardial infarction: Secondary | ICD-10-CM

## 2020-05-30 NOTE — Progress Notes (Signed)
Reviewed home exercise prescription today with patient. Pt voices that she feels like she has recovered form her fall and was able to do more walking with family and friends this last weekend. Pt feels ready to accept the education regarding home exercise and feels like she can begin to walk at home. She is currently no walking at home. I encouraged patient to use her walker as she has a more confident gait while using it here in CRP2. Stressed to patient the finding a safe level surface to walk is very important, even if that means walking inside her home. Encouraged patient to always take medications as prescribed and carry cellphone and ID if walking outdoors. Weather parameters for temperature and humidity reviewed and hydration before, during and after exercise discussed. THRR of 57-114 discussed. Reviewed S/s that would require patient to terminate  exercise and when to call 911 vs MD. Pt verbalized understanding of home exercise prescription and was provided copy.  Lesly Rubenstein MS, ACSM-EP-C, CCRP

## 2020-06-01 ENCOUNTER — Other Ambulatory Visit: Payer: Self-pay

## 2020-06-01 ENCOUNTER — Encounter (HOSPITAL_COMMUNITY)
Admission: RE | Admit: 2020-06-01 | Discharge: 2020-06-01 | Disposition: A | Discharge status: Home / Self Care | Payer: Medicare Other | Source: Ambulatory Visit | Attending: Cardiovascular Disease | Admitting: Cardiovascular Disease

## 2020-06-01 DIAGNOSIS — I214 Non-ST elevation (NSTEMI) myocardial infarction: Secondary | ICD-10-CM

## 2020-06-01 DIAGNOSIS — Z951 Presence of aortocoronary bypass graft: Secondary | ICD-10-CM

## 2020-06-03 ENCOUNTER — Encounter (HOSPITAL_COMMUNITY)
Admission: RE | Admit: 2020-06-03 | Discharge: 2020-06-03 | Disposition: A | Discharge status: Home / Self Care | Payer: Medicare Other | Source: Ambulatory Visit | Attending: Cardiovascular Disease | Admitting: Cardiovascular Disease

## 2020-06-03 ENCOUNTER — Other Ambulatory Visit: Payer: Self-pay

## 2020-06-03 DIAGNOSIS — I214 Non-ST elevation (NSTEMI) myocardial infarction: Secondary | ICD-10-CM | POA: Insufficient documentation

## 2020-06-03 DIAGNOSIS — Z951 Presence of aortocoronary bypass graft: Secondary | ICD-10-CM

## 2020-06-06 ENCOUNTER — Other Ambulatory Visit: Payer: Self-pay | Admitting: Cardiovascular Disease

## 2020-06-08 ENCOUNTER — Other Ambulatory Visit: Payer: Self-pay

## 2020-06-08 ENCOUNTER — Encounter (HOSPITAL_COMMUNITY)
Admission: RE | Admit: 2020-06-08 | Discharge: 2020-06-08 | Disposition: A | Discharge status: Home / Self Care | Payer: Medicare Other | Source: Ambulatory Visit | Attending: Cardiovascular Disease | Admitting: Cardiovascular Disease

## 2020-06-08 DIAGNOSIS — I214 Non-ST elevation (NSTEMI) myocardial infarction: Secondary | ICD-10-CM

## 2020-06-08 DIAGNOSIS — Z951 Presence of aortocoronary bypass graft: Secondary | ICD-10-CM

## 2020-06-08 NOTE — Progress Notes (Signed)
Incomplete Session Note  Patient Details  Name: Judy [name] MRN: [id number] Date of Birth: February 26, 1941 Referring Provider:     CARDIAC REHAB PHASE II ORIENTATION from 04/19/2020 in [facility]  Referring Provider O'Neal, Cassie Freer, MD      [name] did not complete her rehab session. [name] presented to her cardiac rehab session complaining of new onset diarrhea beginning yesterday 06/07/20. She feels it may be secondary to "eating a hotdog" however per protocol patient is not allowed to exercise in a group setting. She is instructed to not return to cardiac rehab exercise until she if 48 hour symptom free without any antidiarrheal medications. Patient verbalized understanding and discharged home in stable condition.  Erynne Kealey E. Rollene Rotunda RN, BSN [place]. [facility]  Cardiac and Pulmonary Rehabilitation Phone: [phone] Fax: [phone]

## 2020-06-10 ENCOUNTER — Encounter (HOSPITAL_COMMUNITY): Payer: Medicare Other

## 2020-06-11 ENCOUNTER — Other Ambulatory Visit (HOSPITAL_COMMUNITY): Payer: Medicare Other

## 2020-06-13 ENCOUNTER — Other Ambulatory Visit: Payer: Self-pay

## 2020-06-13 ENCOUNTER — Encounter (HOSPITAL_COMMUNITY)
Admission: RE | Admit: 2020-06-13 | Discharge: 2020-06-13 | Disposition: A | Discharge status: Home / Self Care | Payer: Medicare Other | Source: Ambulatory Visit | Attending: Cardiovascular Disease | Admitting: Cardiovascular Disease

## 2020-06-13 DIAGNOSIS — Z951 Presence of aortocoronary bypass graft: Secondary | ICD-10-CM | POA: Diagnosis not present

## 2020-06-13 DIAGNOSIS — I214 Non-ST elevation (NSTEMI) myocardial infarction: Secondary | ICD-10-CM

## 2020-06-14 ENCOUNTER — Ambulatory Visit (HOSPITAL_BASED_OUTPATIENT_CLINIC_OR_DEPARTMENT_OTHER): Payer: Medicare Other | Attending: Critical Care Medicine | Admitting: Internal Medicine

## 2020-06-14 DIAGNOSIS — G4733 Obstructive sleep apnea (adult) (pediatric): Secondary | ICD-10-CM

## 2020-06-15 ENCOUNTER — Encounter (HOSPITAL_COMMUNITY): Payer: Medicare Other

## 2020-06-17 ENCOUNTER — Other Ambulatory Visit: Payer: Self-pay

## 2020-06-17 ENCOUNTER — Encounter (HOSPITAL_COMMUNITY)
Admission: RE | Admit: 2020-06-17 | Discharge: 2020-06-17 | Disposition: A | Discharge status: Home / Self Care | Payer: Medicare Other | Source: Ambulatory Visit | Attending: Cardiovascular Disease | Admitting: Cardiovascular Disease

## 2020-06-17 DIAGNOSIS — I214 Non-ST elevation (NSTEMI) myocardial infarction: Secondary | ICD-10-CM

## 2020-06-17 DIAGNOSIS — Z951 Presence of aortocoronary bypass graft: Secondary | ICD-10-CM

## 2020-06-17 NOTE — Progress Notes (Signed)
Incomplete Session Note  Patient Details  Name: Judy [name] MRN: [id number] Date of Birth: 01/15/1941 Referring Provider:     CARDIAC REHAB PHASE II ORIENTATION from 04/19/2020 in [facility]  Referring Provider O'Neal, Cassie Freer, MD      [name] did not complete her rehab session.  Ms. Ringer presents to cardiac rehab complaining of nausea. States she "threw up" in the parking lot on Wednesday of this week, less than 48 hours ago. Ms. Cragun feel it is a side effect of her medication however patient is "unsure". Has a PCP appointment next Thursday. She is encouraged to contact her PCP for a sooner appointment if possible. Patient is also instructed to not return to CR until she had diagnosis of medication induced nausea OR has been symptom free for 48 hours without medication. Patient verbalized understanding. Discharged home in stable condition with nausea as only complaint.  Jahkai Yandell E. Rollene Rotunda RN, BSN [place]. [facility]  Cardiac and Pulmonary Rehabilitation Phone: [phone] Fax: [phone]

## 2020-06-19 ENCOUNTER — Emergency Department (HOSPITAL_COMMUNITY)
Admission: EM | Admit: 2020-06-19 | Discharge: 2020-06-19 | Disposition: A | Discharge status: Home / Self Care | Payer: Medicare Other | Attending: Emergency Medicine | Admitting: Emergency Medicine

## 2020-06-19 ENCOUNTER — Other Ambulatory Visit: Payer: Self-pay

## 2020-06-19 DIAGNOSIS — Z5321 Procedure and treatment not carried out due to patient leaving prior to being seen by health care provider: Secondary | ICD-10-CM | POA: Diagnosis not present

## 2020-06-19 DIAGNOSIS — M791 Myalgia, unspecified site: Secondary | ICD-10-CM | POA: Diagnosis present

## 2020-06-19 NOTE — ED Triage Notes (Signed)
Patient reports she had ran into a table and fell Thursday. Hit on right side of body, reports rib pain today 10/10. Also hit head when fell on floor. Pain reported in head.

## 2020-06-20 ENCOUNTER — Other Ambulatory Visit: Payer: Self-pay

## 2020-06-20 ENCOUNTER — Emergency Department (HOSPITAL_COMMUNITY): Payer: Medicare Other

## 2020-06-20 ENCOUNTER — Encounter (HOSPITAL_COMMUNITY): Payer: Self-pay

## 2020-06-20 ENCOUNTER — Emergency Department (HOSPITAL_COMMUNITY)
Admission: EM | Admit: 2020-06-20 | Discharge: 2020-06-20 | Disposition: A | Discharge status: Home / Self Care | Payer: Medicare Other | Attending: Emergency Medicine | Admitting: Emergency Medicine

## 2020-06-20 ENCOUNTER — Other Ambulatory Visit: Payer: Self-pay | Admitting: Family Medicine

## 2020-06-20 ENCOUNTER — Encounter (HOSPITAL_COMMUNITY): Admission: RE | Admit: 2020-06-20 | Payer: Medicare Other | Source: Ambulatory Visit

## 2020-06-20 DIAGNOSIS — S2231XA Fracture of one rib, right side, initial encounter for closed fracture: Secondary | ICD-10-CM | POA: Insufficient documentation

## 2020-06-20 DIAGNOSIS — Z7901 Long term (current) use of anticoagulants: Secondary | ICD-10-CM | POA: Diagnosis not present

## 2020-06-20 DIAGNOSIS — Z955 Presence of coronary angioplasty implant and graft: Secondary | ICD-10-CM | POA: Insufficient documentation

## 2020-06-20 DIAGNOSIS — S060X0A Concussion without loss of consciousness, initial encounter: Secondary | ICD-10-CM | POA: Diagnosis not present

## 2020-06-20 DIAGNOSIS — Z951 Presence of aortocoronary bypass graft: Secondary | ICD-10-CM

## 2020-06-20 DIAGNOSIS — Z7902 Long term (current) use of antithrombotics/antiplatelets: Secondary | ICD-10-CM | POA: Insufficient documentation

## 2020-06-20 DIAGNOSIS — S0990XA Unspecified injury of head, initial encounter: Secondary | ICD-10-CM | POA: Diagnosis present

## 2020-06-20 DIAGNOSIS — I10 Essential (primary) hypertension: Secondary | ICD-10-CM | POA: Insufficient documentation

## 2020-06-20 DIAGNOSIS — R109 Unspecified abdominal pain: Secondary | ICD-10-CM | POA: Insufficient documentation

## 2020-06-20 DIAGNOSIS — E119 Type 2 diabetes mellitus without complications: Secondary | ICD-10-CM | POA: Diagnosis not present

## 2020-06-20 DIAGNOSIS — I2510 Atherosclerotic heart disease of native coronary artery without angina pectoris: Secondary | ICD-10-CM | POA: Diagnosis not present

## 2020-06-20 DIAGNOSIS — I4891 Unspecified atrial fibrillation: Secondary | ICD-10-CM | POA: Insufficient documentation

## 2020-06-20 DIAGNOSIS — W1830XA Fall on same level, unspecified, initial encounter: Secondary | ICD-10-CM | POA: Insufficient documentation

## 2020-06-20 DIAGNOSIS — Y999 Unspecified external cause status: Secondary | ICD-10-CM | POA: Insufficient documentation

## 2020-06-20 DIAGNOSIS — Y9289 Other specified places as the place of occurrence of the external cause: Secondary | ICD-10-CM | POA: Insufficient documentation

## 2020-06-20 DIAGNOSIS — W19XXXA Unspecified fall, initial encounter: Secondary | ICD-10-CM

## 2020-06-20 DIAGNOSIS — Y9301 Activity, walking, marching and hiking: Secondary | ICD-10-CM | POA: Diagnosis not present

## 2020-06-20 DIAGNOSIS — I214 Non-ST elevation (NSTEMI) myocardial infarction: Secondary | ICD-10-CM

## 2020-06-20 LAB — COMPREHENSIVE METABOLIC PANEL
ALT: 17 U/L (ref 0–44)
AST: 17 U/L (ref 15–41)
Albumin: 3.8 g/dL (ref 3.5–5.0)
Alkaline Phosphatase: 85 U/L (ref 38–126)
Anion gap: 12 (ref 5–15)
BUN: 17 mg/dL (ref 8–23)
CO2: 30 mmol/L (ref 22–32)
Calcium: 8.8 mg/dL — ABNORMAL LOW (ref 8.9–10.3)
Chloride: 100 mmol/L (ref 98–111)
Creatinine, Ser: 1.02 mg/dL — ABNORMAL HIGH (ref 0.44–1.00)
GFR calc Af Amer: >60 mL/min (ref >60)
GFR calc non Af Amer: 53 mL/min — ABNORMAL LOW (ref >60)
Glucose, Bld: 114 mg/dL — ABNORMAL HIGH (ref 70–99)
Potassium: 3.6 mmol/L (ref 3.5–5.1)
Sodium: 142 mmol/L (ref 135–145)
Total Bilirubin: 0.5 mg/dL (ref 0.3–1.2)
Total Protein: 6.6 g/dL (ref 6.5–8.1)

## 2020-06-20 LAB — CBC
HCT: 40.9 % (ref 36.0–46.0)
Hemoglobin: 12.8 g/dL (ref 12.0–15.0)
MCH: 27.5 pg (ref 26.0–34.0)
MCHC: 31.3 g/dL (ref 30.0–36.0)
MCV: 87.8 fL (ref 80.0–100.0)
Platelets: 260 10*3/uL (ref 150–400)
RBC: 4.66 MIL/uL (ref 3.87–5.11)
RDW: 15 % (ref 11.5–15.5)
WBC: 9.4 10*3/uL (ref 4.0–10.5)
nRBC: 0 % (ref 0.0–0.2)

## 2020-06-20 LAB — LIPASE, BLOOD: Lipase: 29 U/L (ref 11–51)

## 2020-06-20 MED ORDER — SODIUM CHLORIDE (PF) 0.9 % IJ SOLN
INTRAMUSCULAR | Status: AC
  Filled 2020-06-20: qty 50

## 2020-06-20 MED ORDER — IOHEXOL 300 MG/ML  SOLN
100.0000 mL | Freq: Once | INTRAMUSCULAR | Status: AC | PRN
  Administered 2020-06-20: 100 mL via INTRAVENOUS

## 2020-06-20 MED ORDER — ONDANSETRON 4 MG PO TBDP
4.0000 mg | ORAL_TABLET | Freq: Three times a day (TID) | ORAL | 0 refills | Status: DC | PRN
Start: 2020-06-20 — End: 2020-08-11

## 2020-06-20 NOTE — Telephone Encounter (Signed)
Requested medications are due for refill today?   Yes - This refill cannot be delegated.    Requested medications are on active medication list?  Yes  Last Refill:   05/27/2020  # 30 with no refills  Future visit scheduled?  No   Notes to Clinic:  This refill cannot be delegated.

## 2020-06-20 NOTE — ED Provider Notes (Signed)
[facility] DEPT Provider Note   CSN: [id number] Arrival date & time: 06/20/20  0931     History Chief Complaint  Patient presents with  . Fall  . Head Injury    Judy [name] is a 79 y.o. female.   Fall This is a new problem. The current episode started more than 2 days ago. The problem occurs constantly. The problem has been gradually worsening. Associated symptoms include headaches. Pertinent negatives include no chest pain and no shortness of breath. Nothing aggravates the symptoms. Nothing relieves the symptoms. She has tried nothing for the symptoms.       Past Medical History:  Diagnosis Date  . Anxiety   . Back pain   . Complication of anesthesia 2003   gallbladder surgery-resp. arrest-stop operation and pt. went to ICU - patient stated she has back surgery since then and had no trouble at all  . Depression   . Diabetes ([place])   . Dyspnea   . Gout   . History of kidney stones   . Hypertension   . Leg edema   . Pancreatitis, chronic ([place]) 03/2016  . Pneumonia due to COVID-19 virus 12/03/2019  . Sleep apnea     Patient Active Problem List   Diagnosis Date Noted  . Coronary artery disease 05/17/2020  . Pleural effusion 04/12/2020  . Atrial fibrillation ([place]) 03/11/2020  . S/P CABG (coronary artery bypass graft) 03/03/2020  . Cerebral thrombosis with cerebral infarction 03/02/2020  . Hyperlipidemia associated with type 2 diabetes mellitus ([place]) 02/09/2020  . Type 2 diabetes mellitus, controlled ([place]) 01/12/2020  . Class 2 severe obesity with serious comorbidity and body mass index (BMI) of 35.0 to 35.9 in adult, unspecified obesity type ([place]) 12/25/2018  . Obstructive sleep apnea 11/04/2017  . Lumbar stenosis with neurogenic claudication 11/19/2016  . HTN (hypertension) 03/21/2016  . Anxiety   . Depression     Past Surgical History:  Procedure Laterality Date  . BACK SURGERY    . BREAST LUMPECTOMY WITH RADIOACTIVE SEED  LOCALIZATION Right 10/23/2019   Procedure: RIGHT BREAST LUMPECTOMY WITH RADIOACTIVE SEED LOCALIZATION;  Surgeon: Jovita Kussmaul, MD;  Location: [facility];  Service: General;  Laterality: Right;  . CARDIAC SURGERY    . CHOLECYSTECTOMY    . COLONOSCOPY  2013  . CORONARY ARTERY BYPASS GRAFT N/A 03/03/2020   Procedure: CORONARY ARTERY BYPASS GRAFTING (CABG), ON PUMP, TIMES FIVE, USING LEFT INTERNAL MAMMARY ARTERY AND ENDOSCOPICALLY HARVESTED BILATERAL GREATER SAPHENOUS VEINS -flow track only;  Surgeon: Lajuana Matte, MD;  Location: [facility];  Service: Open Heart Surgery;  Laterality: N/A;  . LEFT HEART CATH AND CORONARY ANGIOGRAPHY N/A 03/01/2020   Procedure: LEFT HEART CATH AND CORONARY ANGIOGRAPHY;  Surgeon: Belva Crome, MD;  Location: [facility];  Service: Cardiovascular;  Laterality: N/A;  . TEE WITHOUT CARDIOVERSION N/A 03/03/2020   Procedure: TRANSESOPHAGEAL ECHOCARDIOGRAM (TEE);  Surgeon: Lajuana Matte, MD;  Location: [facility];  Service: Open Heart Surgery;  Laterality: N/A;     OB History    Gravida  1   Para      Term      Preterm      AB      Living  1     SAB      TAB      Ectopic      Multiple      Live Births  Family History  Problem Relation Age of Onset  . Dementia Mother   . AAA (abdominal aortic aneurysm) Father   . Breast cancer Maternal Aunt     Social History   Tobacco Use  . Smoking status: Never Smoker  . Smokeless tobacco: Never Used  Vaping Use  . Vaping Use: Never used  Substance Use Topics  . Alcohol use: No    Alcohol/week: 0.0 standard drinks  . Drug use: No    Home Medications Prior to Admission medications   Medication Sig Start Date End Date Taking? Authorizing Provider  atorvastatin (LIPITOR) 20 MG tablet Take 1 tablet (20 mg total) by mouth at bedtime. 05/04/20  Yes O'Neal, Cassie Freer, MD  clopidogrel (PLAVIX) 75 MG tablet Take 1 tablet (75 mg total) by mouth daily. 05/04/20  Yes O'Neal, Cassie Freer,  MD  ELIQUIS 5 MG TABS tablet TAKE 1 TABLET(5 MG) BY MOUTH TWICE DAILY Patient taking differently: Take 5 mg by mouth 2 (two) times daily.  06/07/20  Yes O'Neal, Cassie Freer, MD  empagliflozin (JARDIANCE) 10 MG TABS tablet Take 10 mg by mouth daily. 04/04/20  Yes Elsie Stain, MD  furosemide (LASIX) 20 MG tablet Take 1 tablet (20 mg total) by mouth daily. 05/04/20 08/02/20 Yes O'Neal, Cassie Freer, MD  losartan (COZAAR) 25 MG tablet Take 1 tablet (25 mg total) by mouth daily. 04/04/20  Yes Elsie Stain, MD  metFORMIN (GLUCOPHAGE) 1000 MG tablet Take 1 tablet (1,000 mg total) by mouth 2 (two) times daily with a meal. 04/04/20  Yes Elsie Stain, MD  methocarbamol (ROBAXIN) 500 MG tablet Take 500 mg by mouth every 8 (eight) hours as needed for muscle spasms.    Yes [provider]  metoprolol tartrate (LOPRESSOR) 25 MG tablet Take 0.5 tablets (12.5 mg total) by mouth 2 (two) times daily. 05/04/20  Yes O'Neal, Cassie Freer, MD  sertraline (ZOLOFT) 100 MG tablet Take 100 mg by mouth daily. 04/28/20  Yes [provider]  traMADol (ULTRAM) 50 MG tablet Take 1 tablet (50 mg total) by mouth every 12 (twelve) hours as needed. Patient taking differently: Take 50 mg by mouth every 12 (twelve) hours as needed for moderate pain.  05/27/20  Yes Charlott Rakes, MD  traZODone (DESYREL) 100 MG tablet Take 100 mg by mouth at bedtime.  04/28/20  Yes [provider]  glucose blood (ACCU-CHEK AVIVA) test strip Test twice daily 04/04/20   Elsie Stain, MD  Lancets (ACCU-CHEK SAFE-T PRO) lancets Test twice daily 04/04/20   Elsie Stain, MD  NON FORMULARY 1 each by Other route See admin instructions. CPAP machine nightly    [provider]  ondansetron (ZOFRAN ODT) 4 MG disintegrating tablet Take 1 tablet (4 mg total) by mouth every 8 (eight) hours as needed for up to 10 doses for nausea or vomiting. 06/20/20   Breck Coons, MD    Allergies    Patient has no known  allergies.  Review of Systems   Review of Systems  Constitutional: Negative for chills and fever.  HENT: Negative for congestion and rhinorrhea.   Eyes: Positive for photophobia.  Respiratory: Negative for cough and shortness of breath.   Cardiovascular: Negative for chest pain and palpitations.  Gastrointestinal: Negative for diarrhea, nausea and vomiting.  Genitourinary: Positive for flank pain. Negative for difficulty urinating and dysuria.  Musculoskeletal: Negative for arthralgias and back pain.  Skin: Negative for rash and wound.  Neurological: Positive for dizziness and headaches. Negative  for light-headedness.    Physical Exam Updated Vital Signs BP (!) 144/64 (BP Location: Left Arm)   Pulse 62   Temp 98.9 F (37.2 C) (Oral)   Resp 18   Ht 5' (1.524 m)   Wt 76.7 kg   SpO2 100%   BMI 33.01 kg/m   Physical Exam Vitals and nursing note reviewed. Exam conducted with a chaperone present.  Constitutional:      General: She is not in acute distress.    Appearance: Normal appearance.  HENT:     Head: Normocephalic and atraumatic.     Nose: No rhinorrhea.  Eyes:     General:        Right eye: No discharge.        Left eye: No discharge.     Conjunctiva/sclera: Conjunctivae normal.  Cardiovascular:     Rate and Rhythm: Normal rate. Rhythm irregular.  Pulmonary:     Effort: Pulmonary effort is normal. No respiratory distress.     Breath sounds: No stridor.  Chest:    Abdominal:     General: Abdomen is flat. There is no distension.     Palpations: Abdomen is soft.  Musculoskeletal:        General: No tenderness or signs of injury.     Cervical back: Normal range of motion and neck supple. No tenderness.  Skin:    General: Skin is warm and dry.  Neurological:     General: No focal deficit present.     Mental Status: She is alert. Mental status is at baseline.     Motor: No weakness.     Comments: 5 out of 5 motor strength in all extremities, sensation intact  throughout, no dysmetria, no dysdiadochokinesia, no ataxia with ambulation, cranial nerves II through XII intact, alert and oriented to person place and time   Psychiatric:        Mood and Affect: Mood normal.        Behavior: Behavior normal.     ED Results / Procedures / Treatments   Labs (all labs ordered are listed, but only abnormal results are displayed) Labs Reviewed  COMPREHENSIVE METABOLIC PANEL - Abnormal; Notable for the following components:      Result Value   Glucose, Bld 114 (*)    Creatinine, Ser 1.02 (*)    Calcium 8.8 (*)    GFR calc non Af Amer 53 (*)    All other components within normal limits  CBC  LIPASE, BLOOD  URINALYSIS, ROUTINE W REFLEX MICROSCOPIC    EKG None  Radiology CT Head Wo Contrast  Result Date: 06/20/2020 CLINICAL DATA:  Status post trauma. EXAM: CT HEAD WITHOUT CONTRAST TECHNIQUE: Contiguous axial images were obtained from the base of the skull through the vertex without intravenous contrast. COMPARISON:  May 11, 2020 FINDINGS: Brain: There is mild cerebral atrophy with widening of the extra-axial spaces and ventricular dilatation. There are areas of decreased attenuation within the white matter tracts of the supratentorial brain, consistent with microvascular disease changes. Vascular: No hyperdense vessel or unexpected calcification. Skull: Normal. Negative for fracture or focal lesion. Sinuses/Orbits: No acute finding. Other: None. IMPRESSION: 1. Generalized cerebral atrophy. 2. No acute intracranial abnormality. Electronically Signed   By: Virgina Norfolk M.D.   On: 06/20/2020 19:13   CT Cervical Spine Wo Contrast  Result Date: 06/20/2020 CLINICAL DATA:  Status post trauma. EXAM: CT CERVICAL SPINE WITHOUT CONTRAST TECHNIQUE: Multidetector CT imaging of the cervical spine was performed without intravenous contrast.  Multiplanar CT image reconstructions were also generated. COMPARISON:  None. FINDINGS: Alignment: Normal. Skull base and  vertebrae: No acute fracture. No primary bone lesion or focal pathologic process. Soft tissues and spinal canal: No prevertebral fluid or swelling. No visible canal hematoma. Disc levels: Moderate to marked severity endplate sclerosis is seen at the levels of C4-C5, C5-C6 and C6-C7. Moderate severity anterior osteophyte formation is seen at the level of C3-C4. Moderate severity intervertebral disc space narrowing is present at the levels of C4-C5, C5-C6, C6-C7 and C7-T1. Mild bilateral multilevel facet joint hypertrophy is seen. Upper chest: A chronic fracture of the posterior aspect of the first left rib is seen. Other: Multiple sternal wires are noted. IMPRESSION: 1. No acute fracture or subluxation of the cervical spine. 2. Moderate severity multilevel degenerative changes, most prominent at the levels of C4-C5, C5-C6, C6-C7 and C7-T1. 3. Chronic fracture of the posterior aspect of the first left rib. Electronically Signed   By: Virgina Norfolk M.D.   On: 06/20/2020 19:09   CT CHEST ABDOMEN PELVIS W CONTRAST  Result Date: 06/20/2020 CLINICAL DATA:  Status post fall. EXAM: CT CHEST, ABDOMEN, AND PELVIS WITH CONTRAST TECHNIQUE: Multidetector CT imaging of the chest, abdomen and pelvis was performed following the standard protocol during bolus administration of intravenous contrast. CONTRAST:  130mL OMNIPAQUE IOHEXOL 300 MG/ML  SOLN COMPARISON:  None. FINDINGS: CT CHEST FINDINGS Cardiovascular: No significant vascular findings. Normal heart size. No pericardial effusion. Mediastinum/Nodes: No enlarged mediastinal, hilar, or axillary lymph nodes. The trachea and esophagus demonstrate no significant findings. A 1.6 cm x 1.1 cm cystic appearing structure is seen within the left lobe of the thyroid gland. Lungs/Pleura: Lungs are clear. No pleural effusion or pneumothorax. Musculoskeletal: Multiple sternal wires are present. An acute, nondisplaced lateral ninth right rib fracture is seen. CT ABDOMEN PELVIS FINDINGS  Hepatobiliary: No focal liver abnormality is seen. Status post cholecystectomy. No biliary dilatation. Pancreas: Unremarkable. No pancreatic ductal dilatation or surrounding inflammatory changes. Spleen: Multiple small foci of low attenuation are seen scattered throughout the spleen. The largest measures approximately 1.1 cm in diameter. Adrenals/Urinary Tract: Adrenal glands are unremarkable. Kidneys are normal in size, without renal calculi or hydronephrosis. A 1.3 cm cyst is seen within the posterior aspect of the mid right kidney. Bladder is unremarkable. Stomach/Bowel: Stomach is within normal limits. Appendix appears normal. No evidence of bowel wall thickening, distention, or inflammatory changes. Noninflamed diverticula are seen throughout the sigmoid colon. Vascular/Lymphatic: There is moderate severity calcification of the abdominal aorta. No enlarged abdominal or pelvic lymph nodes. Reproductive: Status post hysterectomy. No adnexal masses. Other: No abdominal wall hernia or abnormality. No abdominopelvic ascites. Musculoskeletal: Bilateral metallic density pedicle screws are seen within the lower lumbar spine. IMPRESSION: 1. Acute, nondisplaced lateral ninth right rib fracture. 2. Multiple small foci of low attenuation scattered throughout the spleen which may represent small hemangiomas. 3. Noninflamed sigmoid diverticulosis. 4. 1.6 cm x 1.1 cm cystic appearing structure within the left lobe of the thyroid gland. 5. Postoperative changes within the lower lumbar spine. 6. Aortic atherosclerosis. Aortic Atherosclerosis (ICD10-I70.0). Electronically Signed   By: Virgina Norfolk M.D.   On: 06/20/2020 19:06    Procedures Procedures (including critical care time)  Medications Ordered in ED Medications  sodium chloride (PF) 0.9 % injection (has no administration in time range)  iohexol (OMNIPAQUE) 300 MG/ML solution 100 mL (100 mLs Intravenous Contrast Given 06/20/20 1819)    ED Course  I have  reviewed the triage vital signs and the  nursing notes.  Pertinent labs & imaging results that were available during my care of the patient were reviewed by me and considered in my medical decision making (see chart for details).    MDM Rules/Calculators/A&P                          79 year old female who fell 4 days ago is having concussive type symptoms of headache dizziness photophobia.  She has some point tenderness on her right flank where she has bruising, the fall was a mechanical fall while walking at night.  She has no neck pain no midline tenderness to palpation and full range of motion with no distracting injuries I was able to clear her clinically from a cervical spine standpoint.  She will get a CT head as she is on blood thinners status post cardiac surgery earlier this year.  She will get CT chest abdomen pelvis as there could be concern for visceral injury of the abdominal cavity as well as thoracic injury given the location of her bruising.  She will get screening laboratory analysis as well.  Patient does not require any pain control right now she is invited to ask me for some at any point.  Reviewed the CT imaging by radiology myself shows 1 right-sided rib fracture, the ninth rib.  No other injury found reported.  She has chronic changes that are noted that the patient is made aware of.  Laboratory analysis is unremarkable for any acute findings.  Her pain is minimal and well controlled, she is safe for discharge home with outpatient therapy to include over-the-counter pain medication.  Outpatient follow-up.  Strict return precautions are given regarding broken ribs and concussion.  Concussion guidelines were discussed given to the patient she is told to follow-up in a few days with her primary doctor.  She is also given Zofran for nausea related to concussion.   Final Clinical Impression(s) / ED Diagnoses Final diagnoses:  Fall  Closed fracture of one rib of right side, initial  encounter  Concussion without loss of consciousness, initial encounter    Rx / DC Orders ED Discharge Orders         Ordered    ondansetron (ZOFRAN ODT) 4 MG disintegrating tablet  Every 8 hours PRN     Discontinue  Reprint     06/20/20 1930           Breck Coons, MD 06/20/20 1930

## 2020-06-20 NOTE — ED Triage Notes (Signed)
Patient states she was walking at 0300 4 days ago and walked into a table and then when she fell she hit her head on another piece of furniture. Patient states she is on a blood thinner. Patient states she started having a headache yesterday. Patient denies any N/V.

## 2020-06-20 NOTE — Progress Notes (Addendum)
Discharge Progress Report  Patient Details  Name: Judy [name] MRN: [id number] Date of Birth: 28-Jan-1941 Referring Provider:     [facility] from 04/19/2020 in [facility]  Referring Provider Judy [name], Judy Freer, MD       Number of Visits: 14  Reason for Discharge:  Early Exit:  multiple falls and currently in ED with c/o injury with fall  Smoking History:  Social History   Tobacco Use  Smoking Status Never Smoker  Smokeless Tobacco Never Used    Diagnosis:  NSTEMI (non-ST elevated myocardial infarction) ([place])  S/P CABG x 5  ADL UCSD:   Initial Exercise Prescription:   Discharge Exercise Prescription (Final Exercise Prescription Changes):  Exercise Prescription Changes - 05/30/20 1400      Response to Exercise   Blood Pressure (Admit) 116/64    Blood Pressure (Exercise) 132/74    Blood Pressure (Exit) 134/68    Heart Rate (Admit) 78 bpm    Heart Rate (Exercise) 101 bpm    Heart Rate (Exit) 57 bpm    Rating of Perceived Exertion (Exercise) 12    Symptoms None    Comments Feling much better    Duration Progress to 30 minutes of  aerobic without signs/symptoms of physical distress    Intensity THRR unchanged      Progression   Progression Continue to progress workloads to maintain intensity without signs/symptoms of physical distress.    Average METs 2.35      Resistance Training   Training Prescription Yes    Weight 2    Reps 10-15    Time 10 Minutes      Interval Training   Interval Training No      T5 Nustep   Level 2    SPM 70    Minutes 15    METs 2.2      Track   Laps 13    Minutes 15    METs 2.51      Home Exercise Plan   Plans to continue exercise at Home (comment)    Frequency Add 2 additional days to program exercise sessions.    Initial Home Exercises Provided 05/30/20           Functional Capacity:   Psychological, QOL, Others - Outcomes: PHQ 2/9: Depression  screen [facility] 2/9 05/17/2020 04/19/2020 04/04/2020 03/24/2020 03/18/2020  Decreased Interest 2 1 - 1 1  Down, Depressed, Hopeless 2 1 - 1 1  PHQ - 2 Score 4 2 - 2 2  Altered sleeping 3 1 - 1 1  Tired, decreased energy 2 1 - 1 1  Change in appetite 3 1 - 1 1  Feeling bad or failure about yourself  1 0 - 0 0  Trouble concentrating 0 0 - 0 0  Moving slowly or fidgety/restless 1 0 - 0 0  Suicidal thoughts 0 0 0 0 0  PHQ-9 Score 14 5 - 5 5  Difficult doing work/chores - Not difficult at all - Somewhat difficult Somewhat difficult    Quality of Life:  Quality of Life - 04/27/20 1547      Quality of Life   Select Quality of Life      Quality of Life Scores   Health/Function Pre 18.5 %    Socioeconomic Pre 17.25 %    Psych/Spiritual Pre 19.5 %    Family Pre 28.5 %    GLOBAL Pre 19.66 %  Personal Goals: Goals established at orientation with interventions provided to work toward goal.    Personal Goals Discharge:  Goals and Risk Factor Review    Row Name 04/27/20 1437 05/24/20 1405           Core Components/Risk Factors/Patient Goals Review   Personal Goals Review Weight Management/Obesity;Hypertension;Diabetes;Lipids Weight Management/Obesity;Hypertension;Diabetes;Lipids      Review Judy [name] started exercise on 04/27/20 and did well with exercise. Judy [name] continues to work hard to improve her CAD risk factors. She is taking medications as prescribed and has ongoing engagements with RD for diet modifications.      Expected Outcomes Judy [name] will continue to particpate in phase 2 cardiac rehab for exercise, nutrition and lifestyle modifications Judy [name] will continue to particpate in phase 2 cardiac rehab for exercise, nutrition and lifestyle modifications             Exercise Goals and Review:   Exercise Goals Re-Evaluation:  Exercise Goals Re-Evaluation    Row Name 04/27/20 1430 05/04/20 1641 05/30/20 1450         Exercise Goal Re-Evaluation   Exercise Goals Review Increase  Physical Activity;Able to understand and use rate of perceived exertion (RPE) scale Increase Physical Activity;Able to understand and use rate of perceived exertion (RPE) scale Increase Physical Activity;Increase Strength and Stamina;Able to understand and use rate of perceived exertion (RPE) scale;Knowledge and understanding of Target Heart Rate Range (THRR);Able to check pulse independently;Understanding of Exercise Prescription     Comments Patient able to understand and use RPE scale appropriately. Patient absent today, unable to review METs and goals. Reviewed METs and goalsas well as home exercise prescription. Pt voices she has more strength and stamina and has recoverd for her fall of a few weeeks ago, She is using her walker and ambulates with more confidence.     Expected Outcomes Progress workloads as tolerated to help improve cardiorepsiratory fitness. Will review upon return to exercise. Will contiue to use her walker and add 2 days per week of walking at home. Will follow guidelines in the exercise prescription.            Nutrition & Weight - Outcomes:    Nutrition:  Nutrition Therapy & Goals - 05/13/20 1130      Nutrition Therapy   Diet Heart Healthy/Carb modified      Personal Nutrition Goals   Nutrition Goal Pt to build a healthy plate including vegetables, fruits, whole grains, and low-fat dairy products in a heart healthy meal plan    Personal Goal #2 Improved blood glucose control as evidenced by next A1C below 7.0.    Personal Goal #3 Pt able to name foods that affect blood glucose      Intervention Plan   Intervention Prescribe, educate and counsel regarding individualized specific dietary modifications aiming towards targeted core components such as weight, hypertension, lipid management, diabetes, heart failure and other comorbidities.;Nutrition handout(s) given to patient.    Expected Outcomes Short Term Goal: A plan has been developed with personal nutrition goals  set during dietitian appointment.           Nutrition Discharge:   Education Questionnaire Score:    Patient is discharged without review of medications, follow up 6 min walk test, or post program "homework" completion.

## 2020-06-20 NOTE — Discharge Instructions (Signed)
You can take 600 mg of ibuprofen every 6 hours, you can take 1000 mg of Tylenol every 6 hours, you can alternate these every 3 or you can take them together.  

## 2020-06-21 ENCOUNTER — Telehealth (HOSPITAL_COMMUNITY): Payer: Self-pay

## 2020-06-21 NOTE — Telephone Encounter (Signed)
Cardiac Rehab Note:  Unsuccessful telephone encounter to [employer]. Gioffre to discuss recent fall with injury and discharge from cardiac rehab secondary to injury and concussion as documented in EMR. Hip[employer] compliant VM message left requesting call back to [phone].  Lyndall Windt E. Rollene Rotunda RN, BSN [place]. [facility]  Cardiac and Pulmonary Rehabilitation Phone: [phone] Fax: [phone]

## 2020-06-22 ENCOUNTER — Telehealth (HOSPITAL_COMMUNITY): Payer: Self-pay

## 2020-06-22 ENCOUNTER — Telehealth (HOSPITAL_COMMUNITY): Payer: Self-pay | Admitting: Internal Medicine

## 2020-06-22 ENCOUNTER — Encounter (HOSPITAL_COMMUNITY): Payer: Medicare Other

## 2020-06-22 NOTE — Telephone Encounter (Signed)
Cardiac Rehab Note:  Unsuccessful telephone call to [employer]. Judy [name] in response to her leaving message with CR that she would be absent from todays CR session but return on Friday. Patient's message states she "has 2 broken ribs and a concussion". Patient has been discharged from CR secondary to fall with injury and was scheduled for discharge Monday 06/20/20. Hip[employer] compliant VM message left requesting call back to [phone].  Myrtice Lowdermilk E. Rollene Rotunda RN, BSN [place]. [facility]  Cardiac and Pulmonary Rehabilitation Phone: [phone] Fax: [phone]

## 2020-07-22 ENCOUNTER — Other Ambulatory Visit: Payer: Self-pay

## 2020-07-22 ENCOUNTER — Telehealth: Payer: Self-pay | Admitting: Physical Therapy

## 2020-07-22 ENCOUNTER — Ambulatory Visit: Payer: Medicare Other | Attending: Internal Medicine | Admitting: Physical Therapy

## 2020-07-22 ENCOUNTER — Encounter: Payer: Self-pay | Admitting: Physician Assistant

## 2020-07-22 DIAGNOSIS — R2681 Unsteadiness on feet: Secondary | ICD-10-CM | POA: Diagnosis present

## 2020-07-22 DIAGNOSIS — M6281 Muscle weakness (generalized): Secondary | ICD-10-CM | POA: Diagnosis present

## 2020-07-22 DIAGNOSIS — Z9181 History of falling: Secondary | ICD-10-CM | POA: Diagnosis present

## 2020-07-22 DIAGNOSIS — R2689 Other abnormalities of gait and mobility: Secondary | ICD-10-CM | POA: Insufficient documentation

## 2020-07-22 NOTE — Therapy (Signed)
[facility] [address] [place], [place], [address] Phone: [phone]   Fax:  [phone]  Physical Therapy Evaluation  Patient Details  Name: Judy [name] MRN: [id number] Date of Birth: [birth year] Referring Provider (PT): [name], MD   Encounter Date: 07/22/2020   PT End of Session - 07/22/20 1232    Visit Number 1    Number of Visits 9    Date for PT Re-Evaluation 10/20/20   written for 60 day POC   Authorization Type [id number] $30 copay (Collect 1 per disc, per day)    PT Start Time 1103    PT Stop Time 1146    PT Time Calculation (min) 43 min    Equipment Utilized During Treatment Gait belt    Activity Tolerance Patient tolerated treatment well    Behavior During Therapy [facility] for tasks assessed/performed           Past Medical History:  Diagnosis Date   Anxiety    Back pain    Complication of anesthesia 2003   gallbladder surgery-resp. arrest-stop operation and pt. went to ICU - patient stated she has back surgery since then and had no trouble at all   Depression    Diabetes ([place])    Dyspnea    Gout    History of kidney stones    Hypertension    Leg edema    Pancreatitis, chronic ([place]) 03/2016   Pneumonia due to COVID-19 virus 12/03/2019   Sleep apnea     Past Surgical History:  Procedure Laterality Date   BACK SURGERY     BREAST LUMPECTOMY WITH RADIOACTIVE SEED LOCALIZATION Right 10/23/2019   Procedure: RIGHT BREAST LUMPECTOMY WITH RADIOACTIVE SEED LOCALIZATION;  Surgeon: Jovita Kussmaul, MD;  Location: [facility];  Service: General;  Laterality: Right;   CARDIAC SURGERY     CHOLECYSTECTOMY     COLONOSCOPY  2013   CORONARY ARTERY BYPASS GRAFT N/A 03/03/2020   Procedure: CORONARY ARTERY BYPASS GRAFTING (CABG), ON PUMP, TIMES FIVE, USING LEFT INTERNAL MAMMARY ARTERY AND ENDOSCOPICALLY HARVESTED BILATERAL GREATER SAPHENOUS VEINS -flow track only;  Surgeon: Lajuana Matte,  MD;  Location: [facility];  Service: Open Heart Surgery;  Laterality: N/A;   LEFT HEART CATH AND CORONARY ANGIOGRAPHY N/A 03/01/2020   Procedure: LEFT HEART CATH AND CORONARY ANGIOGRAPHY;  Surgeon: Belva Crome, MD;  Location: [facility];  Service: Cardiovascular;  Laterality: N/A;   TEE WITHOUT CARDIOVERSION N/A 03/03/2020   Procedure: TRANSESOPHAGEAL ECHOCARDIOGRAM (TEE);  Surgeon: Lajuana Matte, MD;  Location: [facility];  Service: Open Heart Surgery;  Laterality: N/A;    There were no vitals filed for this visit.     07/22/20 1108  Symptoms/Limitations  Subjective Was hospitalized for COVID pneumonia between December 31 and jan 8. Received PT [address]. Underwent CABG (03/2020) and while she was in the hospital pt had a stroke. Underwent cardiac rehab and was referred here for her stroke and balance. Has been walking with the walker ever since she had her stroke 03/2020. About a month ago had a fall - was in the living room sleeping on the couch, got up to walk and hit the table with her side, went to the ER and broke a rib on her R side and had a concussion. Denies photophobia, changes in her vision, or dizziness.  Pertinent History PMH: CVA, diabetes, HTN, hx of COVID, depression, back pain, CABG (03/2020)  Limitations Standing;Walking;House hold activities  Diagnostic  tests MRI 03/2020: MRI  Subcentimeter R cerebellar infarct. Moderate small vessel disease and atrophy  Patient Stated Goals wants to be able to work on her walking - walk without her RW like she used to  Pain Assessment  Currently in Pain? No/denies     [facility] PT Assessment - 07/22/20 1119      Assessment   Medical Diagnosis imbalance, hx of CVA    Referring Provider (PT) [name], MD    Onset Date/Surgical Date 03/03/20    Hand Dominance Right    Prior Therapy PT [facility] [address] s/p COVID, cardiac rehab       Precautions   Precautions Fall      Balance Screen   Has the patient fallen in the past 6  months Yes    How many times? 4    Has the patient had a decrease in activity level because of a fear of falling?  Yes    Is the patient reluctant to leave their home because of a fear of falling?  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, working outside and in the garden      Cognition   Overall Cognitive Status Impaired/Different from baseline   per pt     Sensation   Light Touch Appears Intact      Coordination   Gross Motor Movements are Fluid and Coordinated No    Coordination and Movement Description slower to perform heel to shin on R      ROM / Strength   AROM / PROM / Strength Strength      Strength   Right Hip Flexion 4/5    Left Hip Flexion 4/5    Right Knee Flexion 5/5    Right Knee Extension 5/5    Left Knee Flexion 5/5    Left Knee Extension 5/5    Right Ankle Dorsiflexion 5/5    Left Ankle Dorsiflexion 5/5      Transfers   Transfers Sit to Stand;Stand to Sit    Sit to Stand 5: Supervision;Without upper extremity assist;From chair/3-in-1    Five time sit to stand comments  14.22 seconds with no UE support from standard height chair, rated RPE afterwards a 7/10    Stand to Sit 5: Supervision;Without upper extremity assist;To chair/3-in-1      Ambulation/Gait   Ambulation/Gait Yes    Ambulation/Gait Assistance 6: Modified independent (Device/Increase time);4: Min guard;5: Supervision    Ambulation/Gait Assistance Details  supervision/min guard with no AD    Ambulation Distance (Feet) --   clinic distances   Assistive device Rolling walker;None    Gait Pattern Step-through pattern;Decreased stance time - right    Ambulation Surface Level;Indoor    Gait velocity 16.09 seconds with RW = 2.03 ft/sec     Gait Comments per pt's  friend, reports that pt normally walks with much more shuffled steps      Standardized Balance Assessment   Standardized Balance Assessment Timed Up and Go Test      Timed Up and Go Test   Normal TUG (seconds) 14.87   with RW   TUG Comments 13.97 seconds with no AD                      Objective measurements completed on examination: See above findings.               PT Education - 07/22/20 1232    Education Details clinical findings, POC    Person(s) Educated Patient   pt's friend   Methods Explanation    Comprehension Verbalized understanding            PT Short Term Goals - 07/22/20 1249      PT SHORT TERM GOAL #1   Title Pt will be independent with initial HEP in order to build upon functional gains made in therapy. ALL STGS DUE 08/19/20    Time 4    Period Weeks    Status New    Target Date 08/19/20      PT SHORT TERM GOAL #2   Title Pt will undergo further assessment of BERG/DGI and LTG written as appropriate.    Baseline 4    Time 4    Period Weeks    Status New      PT SHORT TERM GOAL #3   Title Pt will improve gait speed with LRAD to at least 2.6 ft/sec in order to demo improved community mobility.    Baseline 2.03 ft/sec    Time 4    Period Weeks    Status New             PT Long Term Goals - 07/22/20 0001      PT LONG TERM GOAL #1   Title Pt will be independent with final HEP in order to build upon functional gains made in therapy. ALL LTGS DUE 09/16/20    Time 8    Period Weeks    Status New    Target Date 09/16/20      PT LONG TERM GOAL #2   Title Pt will perform a curb with LRAD with supervision in order to demo  improved community mobility.    Time 8    Period Weeks    Status New      PT LONG TERM GOAL #3   Title Pt will decr TUG time to 13 seconds or less with no AD vs. LRAD in order to demo decr fall risk.    Time 8    Period Weeks    Status New      PT LONG TERM GOAL #4   Title BERG goal to be written as appropriate.    Time 8    Period Weeks    Status New      PT LONG TERM GOAL #5   Title Pt will ambulate at least 300' over outdoor unlevel surfaces with LRAD in order to demo improved community mobility.    Time 8    Period Weeks    Status New                  Plan - 07/22/20 1255    Clinical Impression Statement Patient is a 79 year old female referred to Neuro OPPT due to frequent falls/imbalance. Pt diagnosed  and hospitalized with COVID pneumonia in December/January. Pt with CVA and CABG x4 in April of 2021 and then received cardiac rehab. Prior to pts stroke, pt reporting that she was ambulating with no AD.  Pt's PMH is significant for: CVA, diabetes, HTN, hx of COVID, depression, back pain, CABG (03/2020). The following deficits were present during the exam: decreased strength, decreased activity tolerance, impaired balance, gait abnormalities, decr endurance. Based on TUG pt is at a higher risk for falls and pts gait speed with RW indicates that pt is a limited community ambulator. Pt would benefit from skilled PT to address these impairments and functional limitations to maximize functional mobility independence    Personal Factors and Comorbidities Age;Past/Current Experience;Comorbidity 3+;Time since onset of injury/illness/exacerbation    Comorbidities PMH: CVA, diabetes, HTN, hx of COVID, depression, back pain, CABG (03/2020)    Examination-Activity Limitations Squat;Stairs;Locomotion Level;Bathing;Transfers    Examination-Participation Restrictions Community Activity;Driving;Yard Work    Stability/Clinical Decision Making Stable/Uncomplicated    Designer, jewellery  Low    Rehab Potential Good    PT Frequency 1x / week   due to co pay   PT Duration 8 weeks    PT Treatment/Interventions ADLs/Self Care Home Management;Therapeutic activities;Patient/family education;Cryotherapy;Functional mobility training;Neuromuscular re-education;Moist Heat;Therapeutic exercise;Balance training    PT Next Visit Plan perform BERG/DGI as appropriate and write LTG. initial HEP for strengthening/balance.    Recommended Other Services speech therapy    Consulted and Agree with Plan of Care Patient   pt's friend          Patient will benefit from skilled therapeutic intervention in order to improve the following deficits and impairments:  Cardiopulmonary status limiting activity, Decreased mobility, Decreased activity tolerance, Decreased strength, Difficulty walking, Decreased balance, Abnormal gait, Decreased coordination  Visit Diagnosis: Muscle weakness (generalized)  Other abnormalities of gait and mobility  Unsteadiness on feet  History of falling     Problem List Patient Active Problem List   Diagnosis Date Noted   Coronary artery disease 05/17/2020   Pleural effusion 04/12/2020   Atrial fibrillation ([place]) 03/11/2020   S/P CABG (coronary artery bypass graft) 03/03/2020   Cerebral thrombosis with cerebral infarction 03/02/2020   Hyperlipidemia associated with type 2 diabetes mellitus ([place]) 02/09/2020   Type 2 diabetes mellitus, controlled ([place]) 01/12/2020   Class 2 severe obesity with serious comorbidity and body mass index (BMI) of 35.0 to 35.9 in adult, unspecified obesity type ([place]) 12/25/2018   Obstructive sleep apnea 11/04/2017   Lumbar stenosis with neurogenic claudication 11/19/2016   HTN (hypertension) 03/21/2016   Anxiety    Depression     Arliss Journey, PT, DPT  07/22/2020, 1:01 PM  [facility] [address] [place], [place], [address] Phone: [phone]    Fax:  [phone]  Name: Judy [name] MRN: [id number] Date of Birth: Aug 16, 1941

## 2020-07-25 ENCOUNTER — Other Ambulatory Visit: Payer: Self-pay

## 2020-07-25 ENCOUNTER — Ambulatory Visit: Payer: Medicare Other

## 2020-07-25 DIAGNOSIS — M6281 Muscle weakness (generalized): Secondary | ICD-10-CM

## 2020-07-25 DIAGNOSIS — R2681 Unsteadiness on feet: Secondary | ICD-10-CM

## 2020-07-25 DIAGNOSIS — Z9181 History of falling: Secondary | ICD-10-CM

## 2020-07-25 DIAGNOSIS — R2689 Other abnormalities of gait and mobility: Secondary | ICD-10-CM

## 2020-07-25 NOTE — Patient Instructions (Signed)
Access Code: [email or web address] URL: [email or web address] Date: 07/25/2020 Prepared by: Baldomero Lamy  Exercises Sit to Stand - 1 x daily - 7 x weekly - 3 sets - 10 reps Romberg Stance with Eyes Closed - 1 x daily - 7 x weekly - 1 sets - 3 reps - 30 hold Romberg Stance with Head Nods - 1 x daily - 7 x weekly - 2 sets - 10 reps Romberg Stance with Head Rotation - 1 x daily - 7 x weekly - 2 sets - 10 reps Tandem Stance - 1 x daily - 7 x weekly - 1 sets - 3 reps - 30 hold Walking March with Countertop Support - 1 x daily - 7 x weekly - 3 sets - 10 reps

## 2020-07-25 NOTE — Therapy (Signed)
[facility] [address] [place], [place], [address] Phone: [phone]   Fax:  [phone]  Physical Therapy Treatment  Patient Details  Name: Judy [name] MRN: [id number] Date of Birth: 16-Oct-1941 Referring Provider (PT): [name], MD   Encounter Date: 07/25/2020   PT End of Session - 07/25/20 1106    Visit Number 2    Number of Visits 9    Date for PT Re-Evaluation 10/20/20   written for 60 day POC   Authorization Type [id number] $30 copay (Collect 1 per disc, per day)    PT Start Time 1102    PT Stop Time 1145    PT Time Calculation (min) 43 min    Equipment Utilized During Treatment Gait belt    Activity Tolerance Patient tolerated treatment well    Behavior During Therapy [facility] for tasks assessed/performed           Past Medical History:  Diagnosis Date  . Anxiety   . Back pain   . Complication of anesthesia 2003   gallbladder surgery-resp. arrest-stop operation and pt. went to ICU - patient stated she has back surgery since then and had no trouble at all  . Depression   . Diabetes ([place])   . Dyspnea   . Gout   . History of kidney stones   . Hypertension   . Leg edema   . Pancreatitis, chronic ([place]) 03/2016  . Pneumonia due to COVID-19 virus 12/03/2019  . Sleep apnea     Past Surgical History:  Procedure Laterality Date  . BACK SURGERY    . BREAST LUMPECTOMY WITH RADIOACTIVE SEED LOCALIZATION Right 10/23/2019   Procedure: RIGHT BREAST LUMPECTOMY WITH RADIOACTIVE SEED LOCALIZATION;  Surgeon: Jovita Kussmaul, MD;  Location: [facility];  Service: General;  Laterality: Right;  . CARDIAC SURGERY    . CHOLECYSTECTOMY    . COLONOSCOPY  2013  . CORONARY ARTERY BYPASS GRAFT N/A 03/03/2020   Procedure: CORONARY ARTERY BYPASS GRAFTING (CABG), ON PUMP, TIMES FIVE, USING LEFT INTERNAL MAMMARY ARTERY AND ENDOSCOPICALLY HARVESTED BILATERAL GREATER SAPHENOUS VEINS -flow track only;  Surgeon: Lajuana Matte, MD;   Location: [facility];  Service: Open Heart Surgery;  Laterality: N/A;  . LEFT HEART CATH AND CORONARY ANGIOGRAPHY N/A 03/01/2020   Procedure: LEFT HEART CATH AND CORONARY ANGIOGRAPHY;  Surgeon: Belva Crome, MD;  Location: [facility];  Service: Cardiovascular;  Laterality: N/A;  . TEE WITHOUT CARDIOVERSION N/A 03/03/2020   Procedure: TRANSESOPHAGEAL ECHOCARDIOGRAM (TEE);  Surgeon: Lajuana Matte, MD;  Location: [facility];  Service: Open Heart Surgery;  Laterality: N/A;    There were no vitals filed for this visit.   Subjective Assessment - 07/25/20 1105    Subjective Patient reports no changes since last visit. No pain. No falls.    Pertinent History PMH: CVA, diabetes, HTN, hx of COVID, depression, back pain, CABG (03/2020)    Limitations Standing;Walking;House hold activities    Diagnostic tests MRI 03/2020: MRI  Subcentimeter R cerebellar infarct. Moderate small vessel disease and atrophy    Patient Stated Goals wants to be able to work on her walking - walk without her RW like she used to    Currently in Pain? No/denies                             [facility] Adult PT Treatment/Exercise - 07/25/20 0001      Transfers  Transfers Sit to Stand;Stand to Sit    Sit to Stand 5: Supervision;Without upper extremity assist;From chair/3-in-1    Stand to Sit 5: Supervision;Without upper extremity assist;To chair/3-in-1    Comments Completed x 10 reps sit <> stands from standard height chair without UE support. PT verbal cues for controlled descent      Ambulation/Gait   Ambulation/Gait Yes    Ambulation/Gait Assistance 5: Supervision    Ambulation Distance (Feet) 115 Feet    Assistive device Rolling walker    Gait Pattern Step-through pattern;Decreased stance time - right    Ambulation Surface Level;Indoor      Standardized Balance Assessment   Standardized Balance Assessment Berg Balance Test      Berg Balance Test   Sit to Stand Able to stand  independently using  hands    Standing Unsupported Able to stand safely 2 minutes    Sitting with Back Unsupported but Feet Supported on Floor or Stool Able to sit safely and securely 2 minutes    Stand to Sit Controls descent by using hands    Transfers Able to transfer safely, definite need of hands    Standing Unsupported with Eyes Closed Able to stand 10 seconds with supervision    Standing Ubsupported with Feet Together Able to place feet together independently and stand for 1 minute with supervision    From Standing, Reach Forward with Outstretched Arm Can reach forward >12 cm safely (5")    From Standing Position, Pick up Object from Floor Able to pick up shoe, needs supervision    From Standing Position, Turn to Look Behind Over each Shoulder Turn sideways only but maintains balance    Turn 360 Degrees Able to turn 360 degrees safely but slowly    Standing Unsupported, Alternately Place Feet on Step/Stool Able to complete 4 steps without aid or supervision    Standing Unsupported, One Foot in [place] to take small step independently and hold 30 seconds    Standing on One Leg Able to lift leg independently and hold equal to or more than 3 seconds    Total Score 39      Exercises   Exercises Other Exercises    Other Exercises  Initiated HEP focused on balance/strengthening. PT educating patient to complete all balance exercises in a corner in home with chair placed in front for safety.           Completed the following exercises and included on Initial HEP:    Access Code: [email or web address] URL: [email or web address] Date: 07/25/2020 Prepared by: Baldomero Lamy  Exercises Sit to Stand - 1 x daily - 7 x weekly - 3 sets - 10 reps Romberg Stance with Eyes Closed - 1 x daily - 7 x weekly - 1 sets - 3 reps - 30 hold Romberg Stance with Head Nods - 1 x daily - 7 x weekly - 2 sets - 10 reps Romberg Stance with Head Rotation - 1 x daily - 7 x weekly - 2 sets - 10 reps Tandem Stance - 1 x daily -  7 x weekly - 1 sets - 3 reps - 30 hold Walking March with Countertop Support - 1 x daily - 7 x weekly - 3 sets - 10 reps        PT Education - 07/25/20 1150    Education Details Educated on [employer]; Initial HEP    Person(s) Educated Patient    Methods Explanation;Demonstration;Handout    Comprehension Returned demonstration;Verbalized understanding;Need  further instruction            PT Short Term Goals - 07/25/20 1155      PT SHORT TERM GOAL #1   Title Pt will be independent with initial HEP in order to build upon functional gains made in therapy. ALL STGS DUE 08/19/20    Time 4    Period Weeks    Status New    Target Date 08/19/20      PT SHORT TERM GOAL #2   Title Pt will undergo further assessment of BERG/DGI and LTG written as appropriate.    Baseline 39/56 on 8/23    Time 4    Period Weeks    Status Achieved      PT SHORT TERM GOAL #3   Title Pt will improve gait speed with LRAD to at least 2.6 ft/sec in order to demo improved community mobility.    Baseline 2.03 ft/sec    Time 4    Period Weeks    Status New             PT Long Term Goals - 07/25/20 1155      PT LONG TERM GOAL #1   Title Pt will be independent with final HEP in order to build upon functional gains made in therapy. ALL LTGS DUE 09/16/20    Time 8    Period Weeks    Status New      PT LONG TERM GOAL #2   Title Pt will perform a curb with LRAD with supervision in order to demo improved community mobility.    Time 8    Period Weeks    Status New      PT LONG TERM GOAL #3   Title Pt will decr TUG time to 13 seconds or less with no AD vs. LRAD in order to demo decr fall risk.    Time 8    Period Weeks    Status New      PT LONG TERM GOAL #4   Title Patient will improve Berg Balance to >/= 45/56 to demo improved balance and reduced risk for falls    Baseline 39/56    Time 8    Period Weeks    Status Revised      PT LONG TERM GOAL #5   Title Pt will ambulate at  least 300' over outdoor unlevel surfaces with LRAD in order to demo improved community mobility.    Time 8    Period Weeks    Status New                 Plan - 07/25/20 1153    Clinical Impression Statement Today's skilled PT session included assessment of balance with completion of Berg Balance Test, patient dmeonstrating significant fall risk with score of 39/56. Rest of session spent establishing initiate HEP focused on balance/strengthening exercises. PT edcuating on proper completion at home for improved safety. Patient will benefit from skilled PT services to progress toward all goals.    Personal Factors and Comorbidities Age;Past/Current Experience;Comorbidity 3+;Time since onset of injury/illness/exacerbation    Comorbidities PMH: CVA, diabetes, HTN, hx of COVID, depression, back pain, CABG (03/2020)    Examination-Activity Limitations Squat;Stairs;Locomotion Level;Bathing;Transfers    Examination-Participation Restrictions Community Activity;Driving;Yard Work    Stability/Clinical Decision Making Stable/Uncomplicated    Rehab Potential Good    PT Frequency 1x / week   due to co pay   PT Duration 8 weeks    PT Treatment/Interventions  ADLs/Self Care Home Management;Therapeutic activities;Patient/family education;Cryotherapy;Functional mobility training;Neuromuscular re-education;Moist Heat;Therapeutic exercise;Balance training    PT Next Visit Plan how was HEP? standing balance exercises. gait without AD. IT trainer.    Consulted and Agree with Plan of Care Patient   pt's friend          Patient will benefit from skilled therapeutic intervention in order to improve the following deficits and impairments:  Cardiopulmonary status limiting activity, Decreased mobility, Decreased activity tolerance, Decreased strength, Difficulty walking, Decreased balance, Abnormal gait, Decreased coordination  Visit Diagnosis: Muscle weakness (generalized)  Unsteadiness on feet  Other  abnormalities of gait and mobility  History of falling     Problem List Patient Active Problem List   Diagnosis Date Noted  . Coronary artery disease 05/17/2020  . Pleural effusion 04/12/2020  . Atrial fibrillation ([place]) 03/11/2020  . S/P CABG (coronary artery bypass graft) 03/03/2020  . Cerebral thrombosis with cerebral infarction 03/02/2020  . Hyperlipidemia associated with type 2 diabetes mellitus ([place]) 02/09/2020  . Type 2 diabetes mellitus, controlled ([place]) 01/12/2020  . Class 2 severe obesity with serious comorbidity and body mass index (BMI) of 35.0 to 35.9 in adult, unspecified obesity type ([place]) 12/25/2018  . Obstructive sleep apnea 11/04/2017  . Lumbar stenosis with neurogenic claudication 11/19/2016  . HTN (hypertension) 03/21/2016  . Anxiety   . Depression     Jones Bales, PT, DPT 07/25/2020, 11:58 AM  [facility] [address] [place], [place], [address] Phone: [phone]   Fax:  [phone]  Name: Judy [name] MRN: [id number] Date of Birth: 1941-04-20

## 2020-07-27 ENCOUNTER — Ambulatory Visit: Payer: Medicare Other

## 2020-08-01 ENCOUNTER — Other Ambulatory Visit: Payer: Self-pay

## 2020-08-01 ENCOUNTER — Ambulatory Visit: Payer: Medicare Other

## 2020-08-01 ENCOUNTER — Telehealth: Payer: Self-pay | Admitting: Physical Therapy

## 2020-08-01 DIAGNOSIS — R2689 Other abnormalities of gait and mobility: Secondary | ICD-10-CM

## 2020-08-01 DIAGNOSIS — R2681 Unsteadiness on feet: Secondary | ICD-10-CM

## 2020-08-01 DIAGNOSIS — Z9181 History of falling: Secondary | ICD-10-CM

## 2020-08-01 DIAGNOSIS — M6281 Muscle weakness (generalized): Secondary | ICD-10-CM | POA: Diagnosis not present

## 2020-08-01 NOTE — Therapy (Signed)
[facility] [address] [place], [place], [address] Phone: [phone]   Fax:  [phone]  Physical Therapy Treatment  Patient Details  Name: Judy [name] MRN: [id number] Date of Birth: 1941/01/25 Referring Provider (PT): [name], MD   Encounter Date: 08/01/2020   PT End of Session - 08/01/20 1112    Visit Number 3    Number of Visits 9    Date for PT Re-Evaluation 10/20/20   written for 60 day POC   Authorization Type [id number] $30 copay (Collect 1 per disc, per day)    PT Start Time 1110   pt arriving late   PT Stop Time 1145    PT Time Calculation (min) 35 min    Equipment Utilized During Treatment Gait belt    Activity Tolerance Patient tolerated treatment well    Behavior During Therapy [facility] for tasks assessed/performed           Past Medical History:  Diagnosis Date  . Anxiety   . Back pain   . Complication of anesthesia 2003   gallbladder surgery-resp. arrest-stop operation and pt. went to ICU - patient stated she has back surgery since then and had no trouble at all  . Depression   . Diabetes ([place])   . Dyspnea   . Gout   . History of kidney stones   . Hypertension   . Leg edema   . Pancreatitis, chronic ([place]) 03/2016  . Pneumonia due to COVID-19 virus 12/03/2019  . Sleep apnea     Past Surgical History:  Procedure Laterality Date  . BACK SURGERY    . BREAST LUMPECTOMY WITH RADIOACTIVE SEED LOCALIZATION Right 10/23/2019   Procedure: RIGHT BREAST LUMPECTOMY WITH RADIOACTIVE SEED LOCALIZATION;  Surgeon: Jovita Kussmaul, MD;  Location: [facility];  Service: General;  Laterality: Right;  . CARDIAC SURGERY    . CHOLECYSTECTOMY    . COLONOSCOPY  2013  . CORONARY ARTERY BYPASS GRAFT N/A 03/03/2020   Procedure: CORONARY ARTERY BYPASS GRAFTING (CABG), ON PUMP, TIMES FIVE, USING LEFT INTERNAL MAMMARY ARTERY AND ENDOSCOPICALLY HARVESTED BILATERAL GREATER SAPHENOUS VEINS -flow track only;  Surgeon:  Lajuana Matte, MD;  Location: [facility];  Service: Open Heart Surgery;  Laterality: N/A;  . LEFT HEART CATH AND CORONARY ANGIOGRAPHY N/A 03/01/2020   Procedure: LEFT HEART CATH AND CORONARY ANGIOGRAPHY;  Surgeon: Belva Crome, MD;  Location: [facility];  Service: Cardiovascular;  Laterality: N/A;  . TEE WITHOUT CARDIOVERSION N/A 03/03/2020   Procedure: TRANSESOPHAGEAL ECHOCARDIOGRAM (TEE);  Surgeon: Lajuana Matte, MD;  Location: [facility];  Service: Open Heart Surgery;  Laterality: N/A;    There were no vitals filed for this visit.   Subjective Assessment - 08/01/20 1114    Subjective Patient reports no new changes/complaints since last visit. No falls to report.    Pertinent History PMH: CVA, diabetes, HTN, hx of COVID, depression, back pain, CABG (03/2020)    Limitations Standing;Walking;House hold activities    Diagnostic tests MRI 03/2020: MRI  Subcentimeter R cerebellar infarct. Moderate small vessel disease and atrophy    Patient Stated Goals wants to be able to work on her walking - walk without her RW like she used to    Currently in Pain? No/denies                             [facility] Adult PT Treatment/Exercise - 08/01/20 0001  Ambulation/Gait   Ambulation/Gait Yes    Ambulation/Gait Assistance 5: Supervision    Ambulation/Gait Assistance Details throughout therapy gym with activities with RW. Completed ambulation 2 x 115 ft without AD focused on improved step length.     Ambulation Distance (Feet) 230 Feet    Assistive device Rolling walker;None    Gait Pattern Step-through pattern;Decreased stance time - right    Ambulation Surface Level;Indoor;Outdoor;Paved    Stairs Yes    Stairs Assistance 5: Supervision    Stairs Assistance Details (indicate cue type and reason) completed ascending/descending stairs, with PT providing education on proper sequencing and techniques for improved safety    Stair Management Technique Two rails;Alternating  pattern;Forwards    Number of Stairs 8    Height of Stairs 6    Curb 5: Supervision    Curb Details (indicate cue type and reason) completed ascending/descending curb outdoors with RW, PT providing verbal cues for technique and sequencing with RW for improved safety with community mobility. Patient able to demo properly with supervision and no instances of imbalance noted.       High Level Balance   High Level Balance Activities Head turns    High Level Balance Comments Completed ambulation x 115 ft with horizontal head turns, PT providing CGA. slowed gait speed with completion noted. but completed without AD.                Balance Exercises - 08/01/20 0001      Balance Exercises: Standing   Standing Eyes Opened Narrow base of support (BOS);Head turns;Foam/compliant surface;3 reps;30 secs;Limitations    Standing Eyes Opened Limitations completed feet together eyes open 3 x 30 seconds, progressed to completing horizontal/verticla head turns with eyes open, 2 x 10 reps each. increased balance challenge noted with horizontal head turns    Standing Eyes Closed Narrow base of support (BOS);Foam/compliant surface;4 reps;30 secs;Limitations    Standing Eyes Closed Limitations increased difficulty noted with vision removed    Tandem Stance Eyes open;Eyes closed;2 reps;30 secs;Limitations    Tandem Stance Time 20-30 seconds. increased difficulty with RLE positioned posterior.                PT Short Term Goals - 07/25/20 1155      PT SHORT TERM GOAL #1   Title Pt will be independent with initial HEP in order to build upon functional gains made in therapy. ALL STGS DUE 08/19/20    Time 4    Period Weeks    Status New    Target Date 08/19/20      PT SHORT TERM GOAL #2   Title Pt will undergo further assessment of BERG/DGI and LTG written as appropriate.    Baseline 39/56 on 8/23    Time 4    Period Weeks    Status Achieved      PT SHORT TERM GOAL #3   Title Pt will improve  gait speed with LRAD to at least 2.6 ft/sec in order to demo improved community mobility.    Baseline 2.03 ft/sec    Time 4    Period Weeks    Status New             PT Long Term Goals - 07/25/20 1155      PT LONG TERM GOAL #1   Title Pt will be independent with final HEP in order to build upon functional gains made in therapy. ALL LTGS DUE 09/16/20    Time 8  Period Weeks    Status New      PT LONG TERM GOAL #2   Title Pt will perform a curb with LRAD with supervision in order to demo improved community mobility.    Time 8    Period Weeks    Status New      PT LONG TERM GOAL #3   Title Pt will decr TUG time to 13 seconds or less with no AD vs. LRAD in order to demo decr fall risk.    Time 8    Period Weeks    Status New      PT LONG TERM GOAL #4   Title Patient will improve Berg Balance to >/= 45/56 to demo improved balance and reduced risk for falls    Baseline 39/56    Time 8    Period Weeks    Status Revised      PT LONG TERM GOAL #5   Title Pt will ambulate at least 300' over outdoor unlevel surfaces with LRAD in order to demo improved community mobility.    Time 8    Period Weeks    Status New                 Plan - 08/01/20 1153    Clinical Impression Statement Today;s skilled PT session included gait trianing without AD, as well as completing curb negotiation with RW and PT educating on proper pattern for improved community mobility. Continued progression of balance exercises as tolerated by patient. Patient continue to demo difficulty with vision removed, complaint surfaces, and horizontal head turns. Will continue to progress.    Personal Factors and Comorbidities Age;Past/Current Experience;Comorbidity 3+;Time since onset of injury/illness/exacerbation    Comorbidities PMH: CVA, diabetes, HTN, hx of COVID, depression, back pain, CABG (03/2020)    Examination-Activity Limitations Squat;Stairs;Locomotion Level;Bathing;Transfers     Examination-Participation Restrictions Community Activity;Driving;Yard Work    Stability/Clinical Decision Making Stable/Uncomplicated    Rehab Potential Good    PT Frequency 1x / week   due to co pay   PT Duration 8 weeks    PT Treatment/Interventions ADLs/Self Care Home Management;Therapeutic activities;Patient/family education;Cryotherapy;Functional mobility training;Neuromuscular re-education;Moist Heat;Therapeutic exercise;Balance training    PT Next Visit Plan Continued balance exercises. ambulation w/o AD, gait with head turns. High level balance on blue mat    Consulted and Agree with Plan of Care Patient   pt's friend          Patient will benefit from skilled therapeutic intervention in order to improve the following deficits and impairments:  Cardiopulmonary status limiting activity, Decreased mobility, Decreased activity tolerance, Decreased strength, Difficulty walking, Decreased balance, Abnormal gait, Decreased coordination  Visit Diagnosis: Muscle weakness (generalized)  Unsteadiness on feet  Other abnormalities of gait and mobility  History of falling     Problem List Patient Active Problem List   Diagnosis Date Noted  . Coronary artery disease 05/17/2020  . Pleural effusion 04/12/2020  . Atrial fibrillation ([place]) 03/11/2020  . S/P CABG (coronary artery bypass graft) 03/03/2020  . Cerebral thrombosis with cerebral infarction 03/02/2020  . Hyperlipidemia associated with type 2 diabetes mellitus ([place]) 02/09/2020  . Type 2 diabetes mellitus, controlled ([place]) 01/12/2020  . Class 2 severe obesity with serious comorbidity and body mass index (BMI) of 35.0 to 35.9 in adult, unspecified obesity type ([place]) 12/25/2018  . Obstructive sleep apnea 11/04/2017  . Lumbar stenosis with neurogenic claudication 11/19/2016  . HTN (hypertension) 03/21/2016  . Anxiety   . Depression  Jones Bales, PT, DPT 08/01/2020, 11:56 AM  [facility] [address] [place], [place], [address] Phone: [phone]   Fax:  [phone]  Name: Judy [name] MRN: [id number] Date of Birth: [birth year]

## 2020-08-01 NOTE — Telephone Encounter (Signed)
Faxed order to [employer]. Jacalyn Lefevre, MD (provider not in Epic)   Dr. Jacalyn Lefevre, Judy [name] was evaluated by Physical Therapy at [facility] on 07/22/20. The patient would benefit from a speech therapy evaluation for impaired cognition and memory. If you agree, please place an order in [facility] workque in [facility] or fax the order to [phone].  Thank you, Janann August, PT, DPT 08/01/20 [facility] [address] [place], [place]  [address] Phone:  [phone] Fax:  [phone]

## 2020-08-03 ENCOUNTER — Ambulatory Visit: Payer: Medicare Other

## 2020-08-10 NOTE — Progress Notes (Signed)
Cardiology Office Note:   Date:  08/11/2020  NAME:  Judy [name]    MRN: [id number] DOB:  July 08, 1941   PCP:  Michael Boston, MD  Cardiologist:  Evalina Field, MD   Referring MD: Michael Boston, MD   Chief Complaint  Patient presents with  . Coronary Artery Disease   History of Present Illness:   Judy [name] is a 79 y.o. female with a hx of CAD s/p CABG, HTN, DM, paroxysmal Afib, CVA who presents for follow-up. She has had 2 recent ER visits for falls. We started her on lasix 20 mg daily for LE edema.  She presents with a church member friend.  Apparently there are significant memory issues.  There is been several visits to the emergency room for falls detailed in the medical record.  She reports that she is not exercising.  Due to fall she is undergoing balance therapy with physical therapy.  She stopped going to cardiac rehab due to concerns for falls.  We did discuss that she should have her balance issues addressed before returning to cardiac rehab.  Her diabetes continues to improve.  Her A1c is 6.2.  She reports to be having chronic diarrhea.  I have informed her that we can stop her Metformin.  We can revisit the cardiac rehab once her balance issues have been addressed.  Blood pressure well controlled 144/74 today.  She remains on Eliquis 5 mg twice daily.  Despite her falls no major bleeding issues.  She is also on Plavix.  There are significant Cerner she is not eating well.  She is lost nearly 30 pounds since her cardiac surgery.  She reports that she is eating but her church member friend reports that she probably has not.  She reports infrequent episodes of chest pain.  She reports she can get some sharp rib pain in the left side but no significant pressure.  She has forgotten what kind of pain she had in the hospital at the time of her heart attack.  I have recommended she also see a neurologist given her memory troubles.  She does live with a roommate.  She does have a son but he is  not involved in her life it appears  Problem List 1. CAD/NSTEMI s/p CABG -02/2020 with NSTEMI and had 3v CAD -03/03/2020: CABG x 5; LIMA-LAD, SVG to OM2, OM3, D1, PLV (OM3 jump graft off D1) 2. HTN 3. DM -A1c 6.2 -T chol 89, HDL 39, LDL 30, TG 101 4. CVA -post cath complication  5. OSA 6. Afib with RVR -developed after CABG 03/11/2020 -spontaneously converted back to NSR 7. Carotid Artery stenosis -1-39% bilatearlly   Past Medical History: Past Medical History:  Diagnosis Date  . Anxiety   . Back pain   . Complication of anesthesia 2003   gallbladder surgery-resp. arrest-stop operation and pt. went to ICU - patient stated she has back surgery since then and had no trouble at all  . Depression   . Diabetes ([place])   . Dyspnea   . Gout   . History of kidney stones   . Hypertension   . Leg edema   . Pancreatitis, chronic ([place]) 03/2016  . Pneumonia due to COVID-19 virus 12/03/2019  . Sleep apnea     Past Surgical History: Past Surgical History:  Procedure Laterality Date  . BACK SURGERY    . BREAST LUMPECTOMY WITH RADIOACTIVE SEED LOCALIZATION Right 10/23/2019   Procedure: RIGHT BREAST LUMPECTOMY WITH RADIOACTIVE  SEED LOCALIZATION;  Surgeon: Jovita Kussmaul, MD;  Location: [facility];  Service: General;  Laterality: Right;  . CARDIAC SURGERY    . CHOLECYSTECTOMY    . COLONOSCOPY  2013  . CORONARY ARTERY BYPASS GRAFT N/A 03/03/2020   Procedure: CORONARY ARTERY BYPASS GRAFTING (CABG), ON PUMP, TIMES FIVE, USING LEFT INTERNAL MAMMARY ARTERY AND ENDOSCOPICALLY HARVESTED BILATERAL GREATER SAPHENOUS VEINS -flow track only;  Surgeon: Lajuana Matte, MD;  Location: [facility];  Service: Open Heart Surgery;  Laterality: N/A;  . LEFT HEART CATH AND CORONARY ANGIOGRAPHY N/A 03/01/2020   Procedure: LEFT HEART CATH AND CORONARY ANGIOGRAPHY;  Surgeon: Belva Crome, MD;  Location: [facility];  Service: Cardiovascular;  Laterality: N/A;  . TEE WITHOUT CARDIOVERSION N/A 03/03/2020    Procedure: TRANSESOPHAGEAL ECHOCARDIOGRAM (TEE);  Surgeon: Lajuana Matte, MD;  Location: [facility];  Service: Open Heart Surgery;  Laterality: N/A;    Current Medications: Current Meds  Medication Sig  . atorvastatin (LIPITOR) 20 MG tablet Take 1 tablet (20 mg total) by mouth at bedtime.  . clopidogrel (PLAVIX) 75 MG tablet Take 1 tablet (75 mg total) by mouth daily.  [name] ELIQUIS 5 MG TABS tablet TAKE 1 TABLET(5 MG) BY MOUTH TWICE DAILY (Patient taking differently: Take 5 mg by mouth 2 (two) times daily. )  . empagliflozin (JARDIANCE) 10 MG TABS tablet Take 10 mg by mouth daily.  . furosemide (LASIX) 20 MG tablet Take 1 tablet (20 mg total) by mouth daily.  [name] glucose blood (ACCU-CHEK AVIVA) test strip Test twice daily  . Lancets (ACCU-CHEK SAFE-T PRO) lancets Test twice daily  . losartan (COZAAR) 25 MG tablet Take 1 tablet (25 mg total) by mouth daily.  . methocarbamol (ROBAXIN) 500 MG tablet Take 500 mg by mouth every 8 (eight) hours as needed for muscle spasms.   . metoprolol tartrate (LOPRESSOR) 25 MG tablet Take 0.5 tablets (12.5 mg total) by mouth 2 (two) times daily.  . NON FORMULARY 1 each by Other route See admin instructions. CPAP machine nightly  . nystatin cream (MYCOSTATIN) nystatin 100,000 unit/gram topical cream  . traMADol (ULTRAM) 50 MG tablet Take 1 tablet (50 mg total) by mouth every 12 (twelve) hours as needed. (Patient taking differently: Take 50 mg by mouth every 12 (twelve) hours as needed for moderate pain. )  . traZODone (DESYREL) 100 MG tablet Take 100 mg by mouth at bedtime.   . [DISCONTINUED] metFORMIN (GLUCOPHAGE) 1000 MG tablet Take 1 tablet (1,000 mg total) by mouth 2 (two) times daily with a meal.     Allergies:    Patient has no known allergies.   Social History: Social History   Socioeconomic History  . Marital status: Divorced    Spouse name: Not on file  . Number of children: Not on file  . Years of education: Not on file  . Highest education  level: Not on file  Occupational History  . Occupation: CNA  Tobacco Use  . Smoking status: Never Smoker  . Smokeless tobacco: Never Used  Vaping Use  . Vaping Use: Never used  Substance and Sexual Activity  . Alcohol use: No    Alcohol/week: 0.0 standard drinks  . Drug use: No  . Sexual activity: Not Currently  Other Topics Concern  . Not on file  Social History Narrative  . Not on file   Social Determinants of Health   Financial Resource Strain:   . Difficulty of Paying Living Expenses: Not on file  Food  Insecurity:   . Worried About Charity fundraiser in the Last Year: Not on file  . Ran Out of Food in the Last Year: Not on file  Transportation Needs:   . Lack of Transportation (Medical): Not on file  . Lack of Transportation (Non-Medical): Not on file  Physical Activity:   . Days of Exercise per Week: Not on file  . Minutes of Exercise per Session: Not on file  Stress:   . Feeling of Stress : Not on file  Social Connections:   . Frequency of Communication with Friends and Family: Not on file  . Frequency of Social Gatherings with Friends and Family: Not on file  . Attends Religious Services: Not on file  . Active Member of Clubs or Organizations: Not on file  . Attends Archivist Meetings: Not on file  . Marital Status: Not on file     Family History: The patient's family history includes AAA (abdominal aortic aneurysm) in her father; Breast cancer in her maternal aunt; Dementia in her mother.  ROS:   All other ROS reviewed and negative. Pertinent positives noted in the HPI.     EKGs/Labs/Other Studies Reviewed:   The following studies were personally reviewed by me today:   TTE 02/29/2020  1. Left ventricular ejection fraction, by estimation, is 45 to 50%. The  left ventricle has mildly decreased function. The left ventricle  demonstrates regional wall motion abnormalities (see scoring  diagram/findings for description). Inferior  hypokinesis.  There is mild left ventricular hypertrophy. Left ventricular  diastolic parameters are indeterminate.  2. Right ventricular systolic function is normal. The right ventricular  size is normal.  3. The mitral valve is normal in structure. Trivial mitral valve  regurgitation.  4. The aortic valve was not well visualized. Aortic valve regurgitation  is not visualized. No aortic stenosis is present.   Recent Labs: 03/04/2020: Magnesium 2.4 05/11/2020: B Natriuretic Peptide 129.3 06/20/2020: ALT 17; BUN 17; Creatinine, Ser 1.02; Hemoglobin 12.8; Platelets 260; Potassium 3.6; Sodium 142   Recent Lipid Panel    Component Value Date/Time   CHOL 89 03/01/2020 0351   CHOL 184 06/19/2018 0958   TRIG 101 03/01/2020 0351   HDL 39 (L) 03/01/2020 0351   HDL 45 06/19/2018 0958   CHOLHDL 2.3 03/01/2020 0351   VLDL 20 03/01/2020 0351   LDLCALC 30 03/01/2020 0351   LDLCALC 107 (H) 06/19/2018 0958    Physical Exam:   VS:  BP (!) 144/74   Pulse 78   Ht 5' (1.524 m)   Wt 167 lb 9.6 oz (76 kg)   SpO2 97%   BMI 32.73 kg/m    Wt Readings from Last 3 Encounters:  08/11/20 167 lb 9.6 oz (76 kg)  06/20/20 169 lb (76.7 kg)  05/26/20 165 lb 9.1 oz (75.1 kg)    General: Well nourished, well developed, in no acute distress Heart: Atraumatic, normal size  Eyes: PEERLA, EOMI  Neck: Supple, no JVD Endocrine: No thryomegaly Cardiac: Normal S1, S2; RRR; no murmurs, rubs, or gallops Lungs: Clear to auscultation bilaterally, no wheezing, rhonchi or rales  Abd: Soft, nontender, no hepatomegaly  Ext: No edema, pulses 2+ Musculoskeletal: No deformities, BUE and BLE strength normal and equal Skin: Warm and dry, no rashes   Neuro: Alert, awake, oriented to person and place Psych: Normal mood and affect   ASSESSMENT:   Judy [name] is a 79 y.o. female who presents for the following: 1. PAF (paroxysmal atrial  fibrillation) ([place])   2. Coronary artery disease involving coronary bypass graft of native heart  without angina pectoris   3. Mixed hyperlipidemia   4. Essential hypertension   5. Leg edema     PLAN:   1. PAF (paroxysmal atrial fibrillation) ([place]) -She developed A. fib after bypass surgery.  This was several days after bypass.  She converted back to sinus rhythm.  She remains in sinus rhythm.  Continue Eliquis 5 mg twice daily.  She is having falls but no significant bleeding issues.  We will need to be mindful that she may not be able to be on this long-term.  For now no major issues.  I instructed her to use a walker when she is walking.  Falls are concerned.  2. Coronary artery disease involving coronary bypass graft of native heart without angina pectoris -She had a non-STEMI in March 2021.  Underwent bypass surgery.  No symptoms concerning for angina to me.  She will continue Plavix 75 mg daily for 1 year.  We are not giving her aspirin as she is on Eliquis.  Her blood pressures under good control.  Her most recent LDL cholesterol seems to be within limits.  That value was 30.  Her A1c has improved considerably. -I do have concerns about memory issues.  I instructed her and her friend from her church to seek possible neurology evaluation.  She is having difficulties remembering to take medications and balance issues.  Her CT scan of her head demonstrated chronic microvascular disease.  I suspect she has vascular dementia.  3. Mixed hyperlipidemia -Continue Lipitor 20 mg daily.  Most recent LDL 30.  At goal.  4. Essential hypertension -Blood pressure slightly up 144/74.  Given memory issues and fall impairment, I would not recommend aggressive titration.  5. Leg edema -She takes Lasix 20 mg daily.  No edema on exam today.  Disposition: Return in about 6 months (around 02/08/2021).  Medication Adjustments/Labs and Tests Ordered: Current medicines are reviewed at length with the patient today.  Concerns regarding medicines are outlined above.  No orders of the defined types were  placed in this encounter.  No orders of the defined types were placed in this encounter.   Patient Instructions  Medication Instructions:  Stop Metformin   *If you need a refill on your cardiac medications before your next appointment, please call your pharmacy*   Follow-Up: At [facility], you and your health needs are our priority.  As part of our continuing mission to provide you with exceptional heart care, we have created designated Provider Care Teams.  These Care Teams include your primary Cardiologist (physician) and Advanced Practice Providers (APPs -  Physician Assistants and Nurse Practitioners) who all work together to provide you with the care you need, when you need it.  We recommend signing up for the patient portal called "MyChart".  Sign up information is provided on this After Visit Summary.  MyChart is used to connect with patients for Virtual Visits (Telemedicine).  Patients are able to view lab/test results, encounter notes, upcoming appointments, etc.  Non-urgent messages can be sent to your provider as well.   To learn more about what you can do with MyChart, go to [email or web address].    Your next appointment:   6 month(s)  The format for your next appointment:   In Person  Provider:   Eleonore Chiquito, MD       Time Spent with Patient: I have spent a total of 25  minutes with patient reviewing hospital notes, telemetry, EKGs, labs and examining the patient as well as establishing an assessment and plan that was discussed with the patient.  > 50% of time was spent in direct patient care.  Signed, Addison Naegeli. Audie Box, [place]  [address], [place], [place] [address] [phone]  08/11/2020 2:01 PM

## 2020-08-11 ENCOUNTER — Other Ambulatory Visit: Payer: Self-pay

## 2020-08-11 ENCOUNTER — Encounter: Payer: Self-pay | Admitting: Cardiovascular Disease

## 2020-08-11 ENCOUNTER — Ambulatory Visit: Payer: Medicare Other | Admitting: Cardiovascular Disease

## 2020-08-11 VITALS — BP 144/74 | HR 78 | Ht 60.0 in | Wt 167.6 lb

## 2020-08-11 DIAGNOSIS — I25810 Atherosclerosis of coronary artery bypass graft(s) without angina pectoris: Secondary | ICD-10-CM | POA: Diagnosis not present

## 2020-08-11 DIAGNOSIS — I10 Essential (primary) hypertension: Secondary | ICD-10-CM

## 2020-08-11 DIAGNOSIS — E782 Mixed hyperlipidemia: Secondary | ICD-10-CM | POA: Diagnosis not present

## 2020-08-11 DIAGNOSIS — I480 Paroxysmal atrial fibrillation: Secondary | ICD-10-CM

## 2020-08-11 DIAGNOSIS — R600 Localized edema: Secondary | ICD-10-CM

## 2020-08-11 NOTE — Patient Instructions (Addendum)
Medication Instructions:  Stop Metformin   *If you need a refill on your cardiac medications before your next appointment, please call your pharmacy*   Follow-Up: At [facility], you and your health needs are our priority.  As part of our continuing mission to provide you with exceptional heart care, we have created designated Provider Care Teams.  These Care Teams include your primary Cardiologist (physician) and Advanced Practice Providers (APPs -  Physician Assistants and Nurse Practitioners) who all work together to provide you with the care you need, when you need it.  We recommend signing up for the patient portal called "MyChart".  Sign up information is provided on this After Visit Summary.  MyChart is used to connect with patients for Virtual Visits (Telemedicine).  Patients are able to view lab/test results, encounter notes, upcoming appointments, etc.  Non-urgent messages can be sent to your provider as well.   To learn more about what you can do with MyChart, go to [email or web address].    Your next appointment:   6 month(s)  The format for your next appointment:   In Person  Provider:   Eleonore Chiquito, MD

## 2020-08-12 ENCOUNTER — Ambulatory Visit: Payer: Medicare Other | Attending: Internal Medicine

## 2020-08-12 ENCOUNTER — Ambulatory Visit: Payer: Medicare Other

## 2020-08-12 DIAGNOSIS — M6281 Muscle weakness (generalized): Secondary | ICD-10-CM | POA: Diagnosis present

## 2020-08-12 DIAGNOSIS — R2681 Unsteadiness on feet: Secondary | ICD-10-CM | POA: Insufficient documentation

## 2020-08-12 DIAGNOSIS — R2689 Other abnormalities of gait and mobility: Secondary | ICD-10-CM | POA: Insufficient documentation

## 2020-08-12 DIAGNOSIS — Z9181 History of falling: Secondary | ICD-10-CM | POA: Insufficient documentation

## 2020-08-12 NOTE — Therapy (Signed)
[facility] [address] [place], [place], [address] Phone: [phone]   Fax:  [phone]  Physical Therapy Treatment  Patient Details  Name: Judy [name] MRN: [id number] Date of Birth: 11/27/1941 Referring Provider (PT): [name], MD   Encounter Date: 08/12/2020   PT End of Session - 08/12/20 1026    Visit Number 4    Number of Visits 9    Date for PT Re-Evaluation 10/20/20   written for 60 day POC   Authorization Type [id number] $30 copay (Collect 1 per disc, per day)    PT Start Time 1017    PT Stop Time 1059    PT Time Calculation (min) 42 min    Equipment Utilized During Treatment Gait belt    Activity Tolerance Patient tolerated treatment well    Behavior During Therapy WFL for tasks assessed/performed           Past Medical History:  Diagnosis Date  . Anxiety   . Back pain   . Complication of anesthesia 2003   gallbladder surgery-resp. arrest-stop operation and pt. went to ICU - patient stated she has back surgery since then and had no trouble at all  . Depression   . Diabetes ([place])   . Dyspnea   . Gout   . History of kidney stones   . Hypertension   . Leg edema   . Pancreatitis, chronic ([place]) 03/2016  . Pneumonia due to COVID-19 virus 12/03/2019  . Sleep apnea     Past Surgical History:  Procedure Laterality Date  . BACK SURGERY    . BREAST LUMPECTOMY WITH RADIOACTIVE SEED LOCALIZATION Right 10/23/2019   Procedure: RIGHT BREAST LUMPECTOMY WITH RADIOACTIVE SEED LOCALIZATION;  Surgeon: Jovita Kussmaul, MD;  Location: [facility];  Service: General;  Laterality: Right;  . CARDIAC SURGERY    . CHOLECYSTECTOMY    . COLONOSCOPY  2013  . CORONARY ARTERY BYPASS GRAFT N/A 03/03/2020   Procedure: CORONARY ARTERY BYPASS GRAFTING (CABG), ON PUMP, TIMES FIVE, USING LEFT INTERNAL MAMMARY ARTERY AND ENDOSCOPICALLY HARVESTED BILATERAL GREATER SAPHENOUS VEINS -flow track only;  Surgeon: Lajuana Matte, MD;   Location: [facility];  Service: Open Heart Surgery;  Laterality: N/A;  . LEFT HEART CATH AND CORONARY ANGIOGRAPHY N/A 03/01/2020   Procedure: LEFT HEART CATH AND CORONARY ANGIOGRAPHY;  Surgeon: Belva Crome, MD;  Location: [facility];  Service: Cardiovascular;  Laterality: N/A;  . TEE WITHOUT CARDIOVERSION N/A 03/03/2020   Procedure: TRANSESOPHAGEAL ECHOCARDIOGRAM (TEE);  Surgeon: Lajuana Matte, MD;  Location: [facility];  Service: Open Heart Surgery;  Laterality: N/A;    There were no vitals filed for this visit.   Subjective Assessment - 08/12/20 1021    Subjective Patient reports that had MD appointment yesterday and went well. No falls. No pain. Patient reports that her bilateral knuckles have been swelling/pain. Cream has helped.    Pertinent History PMH: CVA, diabetes, HTN, hx of COVID, depression, back pain, CABG (03/2020)    Limitations Standing;Walking;House hold activities    Diagnostic tests MRI 03/2020: MRI  Subcentimeter R cerebellar infarct. Moderate small vessel disease and atrophy    Patient Stated Goals wants to be able to work on her walking - walk without her RW like she used to    Currently in Pain? No/denies  [facility] Adult PT Treatment/Exercise - 08/12/20 0001      Ambulation/Gait   Ambulation/Gait Yes    Ambulation/Gait Assistance 5: Supervision    Ambulation/Gait Assistance Details completed ambulation with no AD, 2 x 115 ft    Ambulation Distance (Feet) 230 Feet    Assistive device None    Gait Pattern Step-through pattern;Decreased stance time - right    Ambulation Surface Level;Indoor    Gait Comments PT educating on potential use of cane for indoor surfaces and continue use of RW on outdoor surfaces.       High Level Balance   High Level Balance Activities Head turns    High Level Balance Comments Completed horizontal/vertical head turns 1 x 115 ft each. Increased difficulty noted with vertical head turns today,  slow gait speed.       Neuro Re-ed    Neuro Re-ed Details  Without UE support in // bars, completed alternating toe taps x 15 reps to cones. 3 instances of increased balance challenge require UE support. PT provide verbal cues to widen BOS. Progressed to completing crossover toe taps x 10 reps, increased challenge noted with this. Intermittent UE support and CGA from PT.                Balance Exercises - 08/12/20 0001      Balance Exercises: Standing   Standing Eyes Opened Narrow base of support (BOS);Head turns;Foam/compliant surface;3 reps;30 secs;Limitations    Standing Eyes Opened Limitations completed feet together initially x 1 minute to gain balance, progressed to completing horizontal/vertical 1 x 15 reps. Increased balance challenge continue with horizontal head turns    Standing Eyes Closed Narrow base of support (BOS);Foam/compliant surface;4 reps;30 secs;Limitations    Standing Eyes Closed Limitations completed narrow BOS with eyes closed 4 x 30 seconds, continues to have increased balance challenge with visoin removed    Stepping Strategy Anterior;Posterior;Foam/compliant surface;Limitations    Stepping Strategy Limitations completed anterior stepping strategy on blue balance beam x 10 reps alteranting BLE. progressed to completing posterior stepping strategy x 10 reps. increased difficulty and balance challenge noted with posterior stepping. Intermittent CGA and UE support required.     Balance Beam completed static standing across blue balance with feet shoulder width apart, with eyes open x 1 minute, progressed to completing eyes closed 3 x 45 seconds. Progressed to completing with narrow BOS with eyes open x 1 minute initially, progressing to eyes closed 3 x 45 seconds. increased balance challenge with eyes closed and narrow BOS.                PT Short Term Goals - 07/25/20 1155      PT SHORT TERM GOAL #1   Title Pt will be independent with initial HEP in order to  build upon functional gains made in therapy. ALL STGS DUE 08/19/20    Time 4    Period Weeks    Status New    Target Date 08/19/20      PT SHORT TERM GOAL #2   Title Pt will undergo further assessment of BERG/DGI and LTG written as appropriate.    Baseline 39/56 on 8/23    Time 4    Period Weeks    Status Achieved      PT SHORT TERM GOAL #3   Title Pt will improve gait speed with LRAD to at least 2.6 ft/sec in order to demo improved community mobility.    Baseline 2.03 ft/sec    Time 4  Period Weeks    Status New             PT Long Term Goals - 07/25/20 1155      PT LONG TERM GOAL #1   Title Pt will be independent with final HEP in order to build upon functional gains made in therapy. ALL LTGS DUE 09/16/20    Time 8    Period Weeks    Status New      PT LONG TERM GOAL #2   Title Pt will perform a curb with LRAD with supervision in order to demo improved community mobility.    Time 8    Period Weeks    Status New      PT LONG TERM GOAL #3   Title Pt will decr TUG time to 13 seconds or less with no AD vs. LRAD in order to demo decr fall risk.    Time 8    Period Weeks    Status New      PT LONG TERM GOAL #4   Title Patient will improve Berg Balance to >/= 45/56 to demo improved balance and reduced risk for falls    Baseline 39/56    Time 8    Period Weeks    Status Revised      PT LONG TERM GOAL #5   Title Pt will ambulate at least 300' over outdoor unlevel surfaces with LRAD in order to demo improved community mobility.    Time 8    Period Weeks    Status New                 Plan - 08/12/20 1137    Clinical Impression Statement Continued gait training today without AD and addition of horizontal/vertical head turns to work on scanning environment. Continued progression of balance exercises, still demonstrate difficulty with vision removed and complaint surface. Will continue to progress toward goals.    Personal Factors and Comorbidities  Age;Past/Current Experience;Comorbidity 3+;Time since onset of injury/illness/exacerbation    Comorbidities PMH: CVA, diabetes, HTN, hx of COVID, depression, back pain, CABG (03/2020)    Examination-Activity Limitations Squat;Stairs;Locomotion Level;Bathing;Transfers    Examination-Participation Restrictions Community Activity;Driving;Yard Work    Stability/Clinical Decision Making Stable/Uncomplicated    Rehab Potential Good    PT Frequency 1x / week   due to co pay   PT Duration 8 weeks    PT Treatment/Interventions ADLs/Self Care Home Management;Therapeutic activities;Patient/family education;Cryotherapy;Functional mobility training;Neuromuscular re-education;Moist Heat;Therapeutic exercise;Balance training    PT Next Visit Plan Check STG's. Try gait with cane. Educated on use of cane in home on indoor surfaces and RW for outdoor surfaces. Continued balance exercises. ambulation w/o AD, gait with head turns. High level balance on blue mat.    Consulted and Agree with Plan of Care Patient   pt's friend          Patient will benefit from skilled therapeutic intervention in order to improve the following deficits and impairments:  Cardiopulmonary status limiting activity, Decreased mobility, Decreased activity tolerance, Decreased strength, Difficulty walking, Decreased balance, Abnormal gait, Decreased coordination  Visit Diagnosis: Unsteadiness on feet  Muscle weakness (generalized)  Other abnormalities of gait and mobility  History of falling     Problem List Patient Active Problem List   Diagnosis Date Noted  . Coronary artery disease 05/17/2020  . Pleural effusion 04/12/2020  . Atrial fibrillation ([place]) 03/11/2020  . S/P CABG (coronary artery bypass graft) 03/03/2020  . Cerebral thrombosis with cerebral infarction 03/02/2020  .  Hyperlipidemia associated with type 2 diabetes mellitus ([place]) 02/09/2020  . Type 2 diabetes mellitus, controlled ([place]) 01/12/2020  . Class 2 severe  obesity with serious comorbidity and body mass index (BMI) of 35.0 to 35.9 in adult, unspecified obesity type ([place]) 12/25/2018  . Obstructive sleep apnea 11/04/2017  . Lumbar stenosis with neurogenic claudication 11/19/2016  . HTN (hypertension) 03/21/2016  . Anxiety   . Depression     Jones Bales, PT, DPT 08/12/2020, 11:40 AM  [facility] [address] [place], [place], [address] Phone: [phone]   Fax:  [phone]  Name: Judy [name] MRN: [id number] Date of Birth: August 24, 1941

## 2020-08-15 ENCOUNTER — Telehealth: Payer: Self-pay | Admitting: Cardiovascular Disease

## 2020-08-15 ENCOUNTER — Other Ambulatory Visit: Payer: Self-pay

## 2020-08-15 DIAGNOSIS — R413 Other amnesia: Secondary | ICD-10-CM

## 2020-08-15 NOTE — Telephone Encounter (Signed)
New message:    Patient daughter calling stating that the patient needs a referral. Please call back.

## 2020-08-15 NOTE — Telephone Encounter (Signed)
Please refer to neurology for memory disturbance.   [name], [place]  [address], [place], [place] [address] [phone]  12:28 PM

## 2020-08-15 NOTE — Telephone Encounter (Signed)
Called and spoke with pt and pt's daughter. Daughter states that they saw Dr.O'Neal on Thursday and that they discussed that pt is in the beginning stages of dementia. She stated Dr.O'Neal advised they see a neurologist but did not have a particular one in mind.  Daughter states that they have called [facility] and would like to see Dr.Penumalli or Dr.Yan however they require a referral and diagnosis prior to being seen.  Notified I would send this message to Dr.O'Neal to review and advise. Pt verbalized understanding with no other questions at this time.

## 2020-08-15 NOTE — Telephone Encounter (Signed)
Sent referral 

## 2020-08-16 ENCOUNTER — Ambulatory Visit: Payer: Medicare Other | Admitting: Physical Therapy

## 2020-08-16 ENCOUNTER — Other Ambulatory Visit: Payer: Self-pay

## 2020-08-16 DIAGNOSIS — R2689 Other abnormalities of gait and mobility: Secondary | ICD-10-CM

## 2020-08-16 DIAGNOSIS — R2681 Unsteadiness on feet: Secondary | ICD-10-CM | POA: Diagnosis not present

## 2020-08-16 DIAGNOSIS — M6281 Muscle weakness (generalized): Secondary | ICD-10-CM

## 2020-08-16 DIAGNOSIS — Z9181 History of falling: Secondary | ICD-10-CM

## 2020-08-16 NOTE — Therapy (Signed)
[facility] [address] [place], [place], [address] Phone: [phone]   Fax:  [phone]  Physical Therapy Treatment  Patient Details  Name: TINAYA CEBALLOS MRN: [id number] Date of Birth: [birth year] Referring Provider (PT): [name], MD   Encounter Date: 08/16/2020   PT End of Session - 08/16/20 1144    Visit Number 5    Number of Visits 9    Date for PT Re-Evaluation 10/20/20   written for 60 day POC   Authorization Type [id number] $30 copay (Collect 1 per disc, per day)    PT Start Time 1101    PT Stop Time 1141    PT Time Calculation (min) 40 min    Equipment Utilized During Treatment Gait belt    Activity Tolerance Patient tolerated treatment well    Behavior During Therapy [facility] for tasks assessed/performed           Past Medical History:  Diagnosis Date  . Anxiety   . Back pain   . Complication of anesthesia 2003   gallbladder surgery-resp. arrest-stop operation and pt. went to ICU - patient stated she has back surgery since then and had no trouble at all  . Depression   . Diabetes ([place])   . Dyspnea   . Gout   . History of kidney stones   . Hypertension   . Leg edema   . Pancreatitis, chronic ([place]) 03/2016  . Pneumonia due to COVID-19 virus 12/03/2019  . Sleep apnea     Past Surgical History:  Procedure Laterality Date  . BACK SURGERY    . BREAST LUMPECTOMY WITH RADIOACTIVE SEED LOCALIZATION Right 10/23/2019   Procedure: RIGHT BREAST LUMPECTOMY WITH RADIOACTIVE SEED LOCALIZATION;  Surgeon: Jovita Kussmaul, MD;  Location: [facility];  Service: General;  Laterality: Right;  . CARDIAC SURGERY    . CHOLECYSTECTOMY    . COLONOSCOPY  2013  . CORONARY ARTERY BYPASS GRAFT N/A 03/03/2020   Procedure: CORONARY ARTERY BYPASS GRAFTING (CABG), ON PUMP, TIMES FIVE, USING LEFT INTERNAL MAMMARY ARTERY AND ENDOSCOPICALLY HARVESTED BILATERAL GREATER SAPHENOUS VEINS -flow track only;  Surgeon: Lajuana Matte, MD;   Location: [facility];  Service: Open Heart Surgery;  Laterality: N/A;  . LEFT HEART CATH AND CORONARY ANGIOGRAPHY N/A 03/01/2020   Procedure: LEFT HEART CATH AND CORONARY ANGIOGRAPHY;  Surgeon: Belva Crome, MD;  Location: [facility];  Service: Cardiovascular;  Laterality: N/A;  . TEE WITHOUT CARDIOVERSION N/A 03/03/2020   Procedure: TRANSESOPHAGEAL ECHOCARDIOGRAM (TEE);  Surgeon: Lajuana Matte, MD;  Location: [facility];  Service: Open Heart Surgery;  Laterality: N/A;    There were no vitals filed for this visit.   Subjective Assessment - 08/16/20 1103    Subjective No falls. Has been doing her exercises. Was taken off metformin.Goes to see about her cataracts tomorrow, states that they might want to take them out.    Pertinent History PMH: CVA, diabetes, HTN, hx of COVID, depression, back pain, CABG (03/2020)    Limitations Standing;Walking;House hold activities    Diagnostic tests MRI 03/2020: MRI  Subcentimeter R cerebellar infarct. Moderate small vessel disease and atrophy    Patient Stated Goals wants to be able to work on her walking - walk without her RW like she used to    Currently in Pain? No/denies  [facility] Adult PT Treatment/Exercise - 08/16/20 1110      Ambulation/Gait   Ambulation/Gait Yes    Ambulation/Gait Assistance 5: Supervision;4: Min guard    Ambulation/Gait Assistance Details performed gait with SPC, pt needing cues for sequencing and technique, pt reporting feeling a bit more unsteady after ambulating one lap, trialed SPC with quad tip with pt reporting feeling much more steady and balanced during gait. discussed will continue to practice in clinic before pt is cleared for use at home. needing cues intermittently to place cane out for a more wider BOS, due to pt putting it too narrow esp going around curves    Ambulation Distance (Feet) 115 Feet   x4   Assistive device None    Gait Pattern Step-through pattern;Decreased  stance time - right    Ambulation Surface Level;Indoor    Gait velocity 12.13 seconds = 2.70 ft/sec with RW, 16.56 seconds =  1.98 ft/sec with SPC with quad tip                Balance Exercises - 08/16/20 0001      Balance Exercises: Standing   Standing Eyes Opened Narrow base of support (BOS);Foam/compliant surface    Standing Eyes Opened Limitations feet together, x5 reps head nods and head turns, then repeated x10 reps of each with min guard for balance     Standing Eyes Closed Narrow base of support (BOS);Foam/compliant surface    Standing Eyes Closed Limitations on blue foam pad, feet a little closer than hip width distance 3 x 30 seconds    Stepping Strategy Anterior;Posterior;Foam/compliant surface;Limitations    Stepping Strategy Limitations on blue foam beam, without UE support, x10 reps each alternating, needing intermittent touching to // bars with forward stepping    Tandem Gait Forward;Intermittent upper extremity support;4 reps;Limitations    Tandem Gait Limitations down and back 4 reps on blue foam beam, cues for slowed and controlled    Marching Solid surface;Forwards;Limitations    Marching Limitations down and back 4 reps in // bars, cues for slowed and controlled for SLS, needing intermittent UE support and then progressing to none    Sit to Stand Without upper extremity support;Foam/compliant surface;Limitations    Sit to Stand Limitations x10 reps on blue foam pad, cues for controlled descent             PT Education - 08/16/20 1144    Education Details progress towards goals, gait training with SPC with quad tip    Person(s) Educated Patient    Methods Explanation;Demonstration;Verbal cues    Comprehension Verbalized understanding;Returned demonstration;Need further instruction            PT Short Term Goals - 08/16/20 1109      PT SHORT TERM GOAL #1   Title Pt will be independent with initial HEP in order to build upon functional gains made in  therapy. ALL STGS DUE 08/19/20    Baseline pt reports independent with HEP at home    Time 4    Period Weeks    Status Achieved    Target Date 08/19/20      PT SHORT TERM GOAL #2   Title Pt will undergo further assessment of BERG/DGI and LTG written as appropriate.    Baseline 39/56 on 8/23    Time 4    Period Weeks    Status Achieved      PT SHORT TERM GOAL #3   Title Pt will improve gait speed with LRAD to at  least 2.6 ft/sec in order to demo improved community mobility.    Baseline 2.03 ft/sec, 12.13 seconds = 2.70 ft/sec    Time 4    Period Weeks    Status Achieved              PT Long Term Goals - 07/25/20 1155      PT LONG TERM GOAL #1   Title Pt will be independent with final HEP in order to build upon functional gains made in therapy. ALL LTGS DUE 09/16/20    Time 8    Period Weeks    Status New      PT LONG TERM GOAL #2   Title Pt will perform a curb with LRAD with supervision in order to demo improved community mobility.    Time 8    Period Weeks    Status New      PT LONG TERM GOAL #3   Title Pt will decr TUG time to 13 seconds or less with no AD vs. LRAD in order to demo decr fall risk.    Time 8    Period Weeks    Status New      PT LONG TERM GOAL #4   Title Patient will improve Berg Balance to >/= 45/56 to demo improved balance and reduced risk for falls    Baseline 39/56    Time 8    Period Weeks    Status Revised      PT LONG TERM GOAL #5   Title Pt will ambulate at least 300' over outdoor unlevel surfaces with LRAD in order to demo improved community mobility.    Time 8    Period Weeks    Status New                 Plan - 08/16/20 1426    Clinical Impression Statement Checked STGs - with pt meeting STG #1, states that she is independent with HEP. 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 Focus of today's skilled session was balance on compliant surfaces with decr UE support and gait  with SPC for use in the home. Pt reporting feeling more steady with SPC with quad tip throughout session today, able to progress from min guard > supervision. Discussed more practice in therapy before being cleared to use at home.    Personal Factors and Comorbidities Age;Past/Current Experience;Comorbidity 3+;Time since onset of injury/illness/exacerbation    Comorbidities PMH: CVA, diabetes, HTN, hx of COVID, depression, back pain, CABG (03/2020)    Examination-Activity Limitations Squat;Stairs;Locomotion Level;Bathing;Transfers    Examination-Participation Restrictions Community Activity;Driving;Yard Work    Stability/Clinical Decision Making Stable/Uncomplicated    Rehab Potential Good    PT Frequency 1x / week   due to co pay   PT Duration 8 weeks    PT Treatment/Interventions ADLs/Self Care Home Management;Therapeutic activities;Patient/family education;Cryotherapy;Functional mobility training;Neuromuscular re-education;Moist Heat;Therapeutic exercise;Balance training    PT Next Visit Plan continue gait training with SPC with quad tip. Continued balance exercises. ambulation w/o AD, gait with head turns. High level balance on blue mat.    Consulted and Agree with Plan of Care Patient   pt's friend          Patient will benefit from skilled therapeutic intervention in order to improve the following deficits and impairments:  Cardiopulmonary status limiting activity, Decreased mobility, Decreased activity tolerance, Decreased strength, Difficulty walking, Decreased balance, Abnormal gait, Decreased coordination  Visit Diagnosis: Unsteadiness  on feet  Muscle weakness (generalized)  Other abnormalities of gait and mobility  History of falling     Problem List Patient Active Problem List   Diagnosis Date Noted  . Coronary artery disease 05/17/2020  . Pleural effusion 04/12/2020  . Atrial fibrillation ([place]) 03/11/2020  . S/P CABG (coronary artery bypass graft) 03/03/2020  .  Cerebral thrombosis with cerebral infarction 03/02/2020  . Hyperlipidemia associated with type 2 diabetes mellitus ([place]) 02/09/2020  . Type 2 diabetes mellitus, controlled ([place]) 01/12/2020  . Class 2 severe obesity with serious comorbidity and body mass index (BMI) of 35.0 to 35.9 in adult, unspecified obesity type ([place]) 12/25/2018  . Obstructive sleep apnea 11/04/2017  . Lumbar stenosis with neurogenic claudication 11/19/2016  . HTN (hypertension) 03/21/2016  . Anxiety   . Depression     Arliss Journey, PT, DPT  08/16/2020, 2:28 PM  [facility] [address] [place], [place], [address] Phone: [phone]   Fax:  [phone]  Name: TIMEA BREED MRN: [id number] Date of Birth: 1941/11/01

## 2020-08-22 ENCOUNTER — Other Ambulatory Visit: Payer: Self-pay

## 2020-08-22 ENCOUNTER — Encounter: Payer: Self-pay | Admitting: Neurology

## 2020-08-22 ENCOUNTER — Ambulatory Visit: Payer: Medicare Other | Admitting: Neurology

## 2020-08-22 VITALS — BP 144/70 | HR 58 | Ht 60.0 in | Wt 169.0 lb

## 2020-08-22 DIAGNOSIS — R269 Unspecified abnormalities of gait and mobility: Secondary | ICD-10-CM | POA: Diagnosis not present

## 2020-08-22 DIAGNOSIS — F0391 Unspecified dementia with behavioral disturbance: Secondary | ICD-10-CM | POA: Diagnosis not present

## 2020-08-22 DIAGNOSIS — G309 Alzheimer's disease, unspecified: Secondary | ICD-10-CM

## 2020-08-22 DIAGNOSIS — F03918 Unspecified dementia, unspecified severity, with other behavioral disturbance: Secondary | ICD-10-CM | POA: Insufficient documentation

## 2020-08-22 DIAGNOSIS — R4189 Other symptoms and signs involving cognitive functions and awareness: Secondary | ICD-10-CM

## 2020-08-22 DIAGNOSIS — R413 Other amnesia: Secondary | ICD-10-CM

## 2020-08-22 DIAGNOSIS — R4689 Other symptoms and signs involving appearance and behavior: Secondary | ICD-10-CM

## 2020-08-22 NOTE — Patient Instructions (Addendum)
POA and HCPOA At physical make sure to ask to get cholesterol bc she has blood vessel disease in the brain and very important to manage medical issues such as diet, cholesterol, diabetes to stop that progression. Blood work Automotive engineer or social services to help with nurtition, discuss with primary care Falls: fall risk, may be due to her chronic microvascular ischemia that can cause gait abnormalities MRI of the brain FDG PET Scan  Formal memory testing with Dr. Sima Matas  Memory Compensation Strategies  1. Use "WARM" strategy.  W= write it down  A= associate it  R= repeat it  M= make a mental note  2.   You can keep a Social worker.  Use a 3-ring notebook with sections for the following: calendar, important names and phone numbers,  medications, doctors' names/phone numbers, lists/reminders, and a section to journal what you did  each day.   3.    Use a calendar to write appointments down.  4.    Write yourself a schedule for the day.  This can be placed on the calendar or in a separate section of the Memory Notebook.  Keeping a  regular schedule can help memory.  5.    Use medication organizer with sections for each day or morning/evening pills.  You may need help loading it  6.    Keep a basket, or pegboard by the door.  Place items that you need to take out with you in the basket or on the pegboard.  You may also want to  include a message board for reminders.  7.    Use sticky notes.  Place sticky notes with reminders in a place where the task is performed.  For example: " turn off the  stove" placed by the stove, "lock the door" placed on the door at eye level, " take your medications" on  the bathroom mirror or by the place where you normally take your medications.  8.    Use alarms/timers.  Use while cooking to remind yourself to check on food or as a reminder to take your medicine, or as a  reminder to make a call, or as a reminder to perform another task,  etc.   Dementia Dementia is a condition that affects the way the brain functions. It often affects memory and thinking. Usually, dementia gets worse with time and cannot be reversed (progressive dementia). There are many types of dementia, including:  Alzheimer's disease. This type is the most common.  Vascular dementia. This type may happen as the result of a stroke.  Lewy body dementia. This type may happen to people who have Parkinson's disease.  Frontotemporal dementia. This type is caused by damage to nerve cells (neurons) in certain parts of the brain. Some people may be affected by more than one type of dementia. This is called mixed dementia. What are the causes? Dementia is caused by damage to cells in the brain. The area of the brain and the types of cells damaged determine the type of dementia. Usually, this damage is irreversible or cannot be undone. Some examples of irreversible causes include:  Conditions that affect the blood vessels of the brain, such as diabetes, heart disease, or blood vessel disease.  Genetic mutations. In some cases, changes in the brain may be caused by another condition and can be reversed or slowed. Some examples of reversible causes include:  Injury to the brain.  Certain medicines.  Infection, such as meningitis.  Metabolic problems, such as  vitamin B12 deficiency or thyroid disease.  Pressure on the brain, such as from a tumor or blood clot. What are the signs or symptoms? Symptoms of dementia depend on the type of dementia. Common signs of dementia include problems with remembering, thinking, problem solving, decision making, and communicating. These signs develop slowly or get worse with time. This may include:  Problems remembering things.  Having trouble taking a bath or putting clothes on.  Forgetting appointments.  Forgetting to pay bills.  Difficulty planning and preparing meals.  Having trouble speaking.  Getting lost  easily. How is this diagnosed? This condition is diagnosed by a specialist (neurologist). It is diagnosed based on the history of your symptoms, your medical history, a physical exam, and tests. Tests may include:  Tests to evaluate brain function, such as memory tests, cognitive tests, and other tests.  Lab tests, such as blood or urine tests.  Imaging tests, such as a CT scan, a PET scan, or an MRI.  Genetic testing. This may be done if other family members have a diagnosis of certain types of dementia. Your health care provider will talk with you and your family, friends, or caregivers about your history and symptoms. How is this treated?  Treatment for this condition depends on the cause of the dementia. Progressive dementias, such as Alzheimer's disease, cannot be cured, but there may be treatments that help to manage symptoms. Treatment might involve taking medicines that may help to:  Control the dementia.  Slow down the progression of the dementia.  Manage symptoms. In some cases, treating the cause of your dementia can improve symptoms, reverse symptoms, or slow down how quickly your dementia becomes worse. Your health care provider can direct you to support groups, organizations, and other health care providers who can help with decisions about your care. Follow these instructions at home: Medicines  Take over-the-counter and prescription medicines only as told by your health care provider.  Use a pill organizer or pill reminder to help you manage your medicines.  Avoid taking medicines that can affect thinking, such as pain medicines or sleeping medicines. Lifestyle  Make healthy lifestyle choices. ? Be physically active as told by your health care provider. ? Do not use any products that contain nicotine or tobacco, such as cigarettes, e-cigarettes, and chewing tobacco. If you need help quitting, ask your health care provider. ? Do not drink alcohol. ? Practice  stress-management techniques when you get stressed. ? Spend time with other people.  Make sure to get quality sleep. These tips can help you get a good night's rest: ? Avoid napping during the day. ? Keep your sleeping area dark and cool. ? Avoid exercising during the few hours before you go to bed. ? Avoid caffeine products in the evening. Eating and drinking  Drink enough fluid to keep your urine pale yellow.  Eat a healthy diet. General instructions   Work with your health care provider to determine what you need help with and what your safety needs are.  Talk with your health care provider about whether it is safe for you to drive.  If you were given a bracelet that identifies you as a person with memory loss or tracks your location, make sure to wear it at all times.  Work with your family to make important decisions, such as advance directives, medical power of attorney, or a living will.  Keep all follow-up visits as told by your health care provider. This is important. Where to  find more information  Alzheimer's Association: CapitalMile.co.nz  National Institute on Aging: DVDEnthusiasts.nl  World Health Organization: RoleLink.com.br Contact a health care provider if:  You have any new or worsening symptoms.  You have problems with choking or swallowing. Get help right away if:  You feel depressed or sad, or feel that you want to harm yourself.  Your family members become concerned for your safety. If you ever feel like you may hurt yourself or others, or have thoughts about taking your own life, get help right away. You can go to your nearest emergency department or call:  Your local emergency services (911 in the [place]  A suicide crisis helpline, such as the [place] at (914)031-3101. This is open 24 hours a day. Summary  Dementia is a condition that affects the way the brain functions. Dementia often affects memory and  thinking.  Usually, dementia gets worse with time and cannot be reversed (progressive dementia).  Treatment for this condition depends on the cause of the dementia.  Work with your health care provider to determine what you need help with and what your safety needs are.  Your health care provider can direct you to support groups, organizations, and other health care providers who can help with decisions about your care. This information is not intended to replace advice given to you by your health care provider. Make sure you discuss any questions you have with your health care provider. Document Revised: 02/03/2019 Document Reviewed: 02/03/2019 Elsevier Patient Education  [place].

## 2020-08-22 NOTE — Progress Notes (Signed)
[facility]    Provider:  Dr Jaynee Eagles Requesting Provider: Geralynn [name], * Primary Care Provider:  Michael Boston, MD  CC:  Memory loss  HPI:  Judy [name] is a 79 y.o. female here as requested by Judy [name], * for dementia. PMHx Fhx dementia, sleep apnea, HTN, MI, diabetes, anxiety. She uses her cpap "faithfully" just go tit fixed. PMHx stroke, sleep apnea, HTN, diabetes, depression, anxiety.  Per friend: She cannot take her own medications. She forgets to eat or take her blood sugar. She leaves the stove on. At church on Sundays she didn't recognize that someone she knew for 40 years didn't have a daughter there, she states she has taste problems. [name], MD is her primary care. She had Covid in November, progressed since having a heart attack and a stroke in March and since having covid, her balance has been awful, her friend has been noticing for several years slowly progressive more short-term memory loss. She provides most information and I met with her for 15 minutes prior to appointment She keeps "babbling" and cardiology was concerned, she is also concerned about falls. She is falling a lot. She has a son who is not involved. The roommate helps a lot and she is helping a lot, she is trying to help with medications and trying to help, she doesn't have a will, unknown POA. She is having a physical. Mother had dementia. She has "lost her filter" and says inappropriate things she wouldn;t before. Maybe a little aggressive, frustrated, anxious. +depression. No alcohol use. She repeats things in the same day.  Per Patient says her memory is from covid, heart attack and the stroke. She started noticing worsening after covid but moreso after heart attack. <ain problem is getting the words out. Sometimes thinking she can see the word but can't get it out. She sews every day. She doesn't have any problems with quilts, no tremors, she says she sent some bills to  the wrong place, her roommate helps with medications and she was scared to mess with her meds, she remembers to take them but rommate helps but them in a box, she is not driving anymore but would like to and doesn't go anywhere far away but she is not driving, she denies car accidents, alcohol use, accidents in the home such as leaving the stove on, she has always forgotten people's names, mother had dementia at the age of 79, she is not eating well, father had dementia alzheimers.   No other focal neurologic deficits, associated symptoms, inciting events or modifiable factors.   Reviewed notes, labs and imaging from outside physicians, which showed:   Personally reviewed imaging of the brain MRI 3/302021(Also reviewed with patient and her friend) and agree with the following:  IMPRESSION: 1. Subcentimeter acute right cerebellar infarct. 2. Moderate chronic small vessel ischemic disease and cerebral atrophy.   Review of Systems: Patient complains of symptoms per HPI as well as the following symptoms: memory loss falls. Pertinent negatives and positives per HPI. All others negative.   Social History   Socioeconomic History  . Marital status: Divorced    Spouse name: Not on file  . Number of children: 1  . Years of education: Not on file  . Highest education level: Some college, no degree  Occupational History  . Occupation: CNA  Tobacco Use  . Smoking status: Never Smoker  . Smokeless tobacco: Never Used  Vaping Use  . Vaping Use: Never used  Substance and Sexual Activity  . Alcohol use: No    Alcohol/week: 0.0 standard drinks  . Drug use: No  . Sexual activity: Not Currently  Other Topics Concern  . Not on file  Social History Narrative   Lives with Roommate   Drinks 32 ounces of caffeine daily   Right Handed   Social Determinants of Health   Financial Resource Strain:   . Difficulty of Paying Living Expenses: Not on file  Food Insecurity:   . Worried About Paediatric nurse in the Last Year: Not on file  . Ran Out of Food in the Last Year: Not on file  Transportation Needs:   . Lack of Transportation (Medical): Not on file  . Lack of Transportation (Non-Medical): Not on file  Physical Activity:   . Days of Exercise per Week: Not on file  . Minutes of Exercise per Session: Not on file  Stress:   . Feeling of Stress : Not on file  Social Connections:   . Frequency of Communication with Friends and Family: Not on file  . Frequency of Social Gatherings with Friends and Family: Not on file  . Attends Religious Services: Not on file  . Active Member of Clubs or Organizations: Not on file  . Attends Archivist Meetings: Not on file  . Marital Status: Not on file  Intimate Partner Violence:   . Fear of Current or Ex-Partner: Not on file  . Emotionally Abused: Not on file  . Physically Abused: Not on file  . Sexually Abused: Not on file    Family History  Problem Relation Age of Onset  . Dementia Mother   . AAA (abdominal aortic aneurysm) Father   . Breast cancer Maternal Aunt   . Bladder Cancer Brother     Past Medical History:  Diagnosis Date  . Anxiety   . Back pain   . Complication of anesthesia 2003   gallbladder surgery-resp. arrest-stop operation and pt. went to ICU - patient stated she has back surgery since then and had no trouble at all  . Depression   . Diabetes ([place])   . Dyspnea   . Gout   . Heart attack [facility])    March 2021  . History of kidney stones   . Hypertension   . Leg edema   . Pancreatitis, chronic ([place]) 03/2016  . Pneumonia due to COVID-19 virus 12/03/2019  . Sleep apnea   . Stroke [facility])    March 2021    Patient Active Problem List   Diagnosis Date Noted  . Memory loss 08/22/2020  . Dementia with behavioral disturbance ([place]) 08/22/2020  . Coronary artery disease 05/17/2020  . Pleural effusion 04/12/2020  . Atrial fibrillation ([place]) 03/11/2020  . S/P CABG (coronary artery bypass graft)  03/03/2020  . Cerebral thrombosis with cerebral infarction 03/02/2020  . Hyperlipidemia associated with type 2 diabetes mellitus ([place]) 02/09/2020  . Type 2 diabetes mellitus, controlled ([place]) 01/12/2020  . Class 2 severe obesity with serious comorbidity and body mass index (BMI) of 35.0 to 35.9 in adult, unspecified obesity type ([place]) 12/25/2018  . Obstructive sleep apnea 11/04/2017  . Lumbar stenosis with neurogenic claudication 11/19/2016  . HTN (hypertension) 03/21/2016  . Anxiety   . Depression     Past Surgical History:  Procedure Laterality Date  . BACK SURGERY    . BREAST LUMPECTOMY WITH RADIOACTIVE SEED LOCALIZATION Right 10/23/2019   Procedure: RIGHT BREAST LUMPECTOMY WITH RADIOACTIVE SEED LOCALIZATION;  Surgeon: Marlou Starks,  Sena Hitch, MD;  Location: [facility];  Service: General;  Laterality: Right;  . CARDIAC SURGERY    . CHOLECYSTECTOMY    . COLONOSCOPY  2013  . CORONARY ARTERY BYPASS GRAFT N/A 03/03/2020   Procedure: CORONARY ARTERY BYPASS GRAFTING (CABG), ON PUMP, TIMES FIVE, USING LEFT INTERNAL MAMMARY ARTERY AND ENDOSCOPICALLY HARVESTED BILATERAL GREATER SAPHENOUS VEINS -flow track only;  Surgeon: Lajuana Matte, MD;  Location: [facility];  Service: Open Heart Surgery;  Laterality: N/A;  . LEFT HEART CATH AND CORONARY ANGIOGRAPHY N/A 03/01/2020   Procedure: LEFT HEART CATH AND CORONARY ANGIOGRAPHY;  Surgeon: Belva Crome, MD;  Location: [place] [facility];  Service: Cardiovascular;  Laterality: N/A;  . TEE WITHOUT CARDIOVERSION N/A 03/03/2020   Procedure: TRANSESOPHAGEAL ECHOCARDIOGRAM (TEE);  Surgeon: Lajuana Matte, MD;  Location: [facility];  Service: Open Heart Surgery;  Laterality: N/A;    Current Outpatient Medications  Medication Sig Dispense Refill  . atorvastatin (LIPITOR) 20 MG tablet Take 1 tablet (20 mg total) by mouth at bedtime. 90 tablet 2  . clopidogrel (PLAVIX) 75 MG tablet Take 1 tablet (75 mg total) by mouth daily. 90 tablet 3  . DULoxetine (CYMBALTA) 60 MG  capsule Take 60 mg by mouth daily.    [name] ELIQUIS 5 MG TABS tablet TAKE 1 TABLET(5 MG) BY MOUTH TWICE DAILY (Patient taking differently: Take 5 mg by mouth 2 (two) times daily. ) 180 tablet 1  . empagliflozin (JARDIANCE) 10 MG TABS tablet Take 10 mg by mouth daily. 30 tablet 4  . glucose blood (ACCU-CHEK AVIVA) test strip Test twice daily 100 each 2  . Lancets (ACCU-CHEK SAFE-T PRO) lancets Test twice daily 100 each 11  . losartan (COZAAR) 25 MG tablet Take 1 tablet (25 mg total) by mouth daily. 30 tablet 3  . methocarbamol (ROBAXIN) 500 MG tablet Take 500 mg by mouth every 8 (eight) hours as needed for muscle spasms.     . metoprolol tartrate (LOPRESSOR) 25 MG tablet Take 0.5 tablets (12.5 mg total) by mouth 2 (two) times daily. 30 tablet 1  . NON FORMULARY 1 each by Other route See admin instructions. CPAP machine nightly    . nystatin cream (MYCOSTATIN) nystatin 100,000 unit/gram topical cream    . traMADol (ULTRAM) 50 MG tablet Take 1 tablet (50 mg total) by mouth every 12 (twelve) hours as needed. (Patient taking differently: Take 50-100 mg by mouth every 8 (eight) hours as needed for moderate pain. ) 30 tablet 0  . traZODone (DESYREL) 100 MG tablet Take 100 mg by mouth at bedtime.     . furosemide (LASIX) 20 MG tablet Take 1 tablet (20 mg total) by mouth daily. 90 tablet 3   No current facility-administered medications for this visit.    Allergies as of 08/22/2020  . (No Known Allergies)    Vitals: BP (!) 144/70   Pulse (!) 58   Ht 5' (1.524 m)   Wt 169 lb (76.7 kg)   BMI 33.01 kg/m  Last Weight:  Wt Readings from Last 1 Encounters:  08/22/20 169 lb (76.7 kg)   Last Height:   Ht Readings from Last 1 Encounters:  08/22/20 5' (1.524 m)     Physical exam: Exam: Gen: NAD, conversant, well nourised, obese, well groomed                     CV: RRR, no MRG. No Carotid Bruits. No peripheral edema, warm, nontender Eyes: Conjunctivae clear without exudates  or  hemorrhage  Neuro: Detailed Neurologic Exam  Speech:    Speech is normal; fluent and spontaneous with normal comprehension.  Cognition: MMSE - Mini Mental State Exam 08/22/2020  Orientation to time 4  Orientation to Place 4  Registration 3  Attention/ Calculation 5  Recall 3  Language- name 2 objects 2  Language- repeat 1  Language- follow 3 step command 3  Language- read & follow direction 1  Write a sentence 0  Copy design 1  Total score 27    Cranial Nerves:    The pupils are equal, round, and reactive to light. Attempted could not visualize. Visual fields are full to finger confrontation. Extraocular movements are intact. Trigeminal sensation is intact and the muscles of mastication are normal. The face is symmetric. The palate elevates in the midline. Hearing intact. Voice is normal. Shoulder shrug is normal. The tongue has normal motion without fasciculations.   Coordination:    Normal finger to nose, no dysmetria or ataxia  Gait:    Using a walker, normal stride and stance, not ataxic or shuffling.   Motor Observation:    No asymmetry, no atrophy, and no involuntary movements noted. Tone:    Normal muscle tone.    Posture:    Posture is normal. normal erect    Strength: deltoid weakness r> l, chronic, says due to right injury abd left shoulder pain. Otherwise strength is V/V in the upper and lower limbs.      Sensation: intact to LT     Reflex Exam:  DTR's:  Absent AJs, left patellar brisk, right 1+ and biceps 1+.    Toes:    The toes are downgoing bilaterally.   Clonus:    Clonus is absent.    Assessment/Plan:  Very lovely patient with likely dementia despite MMSE 27/20. She has moderate chronic microvascular ischemic changes could be mixed dementia alzheimers and/or vascular. But personality changes, "loss of filter", saying this she normal would not say so needs thorough evaluation.    - Discussed POA, HCPOA, she will need social services, she has  community help but may need more can discuss with primary care and refer to social services as needed  - After the memory workup, based on workup and if visual spatial isn't impaired then we can send her for formal driving if appropriate but I am concerned about this patient driving in the future, will see what workup reveals - Falls: not parkinsonian, likely due ot vascular changes in the brain or gait aprazia due to dementia, needs workup. She is a fall risk, discussed prevention, please ask PT if they can discuss home safety for falls - she is going to Neuro PT (consider home safety inspection) - At physical make sure to ask to get cholesterol bc she has blood vessel disease in the brain and very important to manage medical/vascular issues such as diet, cholesterol, diabetes to stop that progression. Gave them literature on white matter changes and dementia. - Blood work today - Poor diet, may need Dietician or social services to help with nurtition, discuss with primary care - MRI of the brain to evaluate for progression or reversible causes or dementia - FDG PET Scan to differentiate between ALZ or FTD due to personality changes as above - Formal memory testing with Dr. Sima Matas   Orders Placed This Encounter  Procedures  . MR BRAIN W WO CONTRAST  . NM PET Metabolic Brain  . B12 and Folate Panel  . Methylmalonic acid,  serum  . Vitamin B1  . Vitamin B6  . TSH  . Homocysteine  . Ambulatory referral to Neuropsychology   No orders of the defined types were placed in this encounter.   Cc: O'Neal, Cassie Freer, *,  Jacalyn Lefevre Jesse Sans, MD  I spent 100 minutes of face-to-face and non-face-to-face time with patient on the  1. Memory loss   2. Alzheimer's disease, unspecified (CODE) ([place])   3. Dementia with behavioral disturbance, unspecified dementia type ([place])   4. Gait abnormality   5. Cognitive and behavioral changes    diagnosis.  This included previsit chart review, lab review, study  review, order entry, electronic health record documentation, patient education on the different diagnostic and therapeutic options, counseling and coordination of care, risks and benefits of management, compliance, or risk factor reduction  Sarina Ill, MD  [facility] [address] [place], [place] [address]  Phone [phone] Fax [phone]

## 2020-08-23 ENCOUNTER — Telehealth: Payer: Self-pay | Admitting: Neurology

## 2020-08-23 ENCOUNTER — Ambulatory Visit: Payer: Medicare Other

## 2020-08-23 ENCOUNTER — Other Ambulatory Visit: Payer: Self-pay | Admitting: Cardiovascular Disease

## 2020-08-23 ENCOUNTER — Other Ambulatory Visit: Payer: Self-pay

## 2020-08-23 DIAGNOSIS — R2681 Unsteadiness on feet: Secondary | ICD-10-CM

## 2020-08-23 DIAGNOSIS — M6281 Muscle weakness (generalized): Secondary | ICD-10-CM

## 2020-08-23 DIAGNOSIS — R2689 Other abnormalities of gait and mobility: Secondary | ICD-10-CM

## 2020-08-23 NOTE — Telephone Encounter (Signed)
[employer] medicare order sent to GI. No auth they will reach out to the patient to schedule.  

## 2020-08-23 NOTE — Therapy (Signed)
[facility] [address] [place], [place], [address] Phone: [phone]   Fax:  [phone]  Physical Therapy Treatment  Patient Details  Name: Judy [name] MRN: [id number] Date of Birth: [birth year] Referring Provider (PT): [name], MD   Encounter Date: 08/23/2020   PT End of Session - 08/23/20 1020    Visit Number 6    Number of Visits 9    Date for PT Re-Evaluation 10/20/20   written for 60 day POC   Authorization Type [id number] $30 copay (Collect 1 per disc, per day)    PT Start Time 1015    PT Stop Time 1058    PT Time Calculation (min) 43 min    Equipment Utilized During Treatment Gait belt    Activity Tolerance Patient tolerated treatment well    Behavior During Therapy [facility] for tasks assessed/performed           Past Medical History:  Diagnosis Date  . Anxiety   . Back pain   . Complication of anesthesia 2003   gallbladder surgery-resp. arrest-stop operation and pt. went to ICU - patient stated she has back surgery since then and had no trouble at all  . Depression   . Diabetes ([place])   . Dyspnea   . Gout   . Heart attack [facility])    March 2021  . History of kidney stones   . Hypertension   . Leg edema   . Pancreatitis, chronic ([place]) 03/2016  . Pneumonia due to COVID-19 virus 12/03/2019  . Sleep apnea   . Stroke [facility])    March 2021    Past Surgical History:  Procedure Laterality Date  . BACK SURGERY    . BREAST LUMPECTOMY WITH RADIOACTIVE SEED LOCALIZATION Right 10/23/2019   Procedure: RIGHT BREAST LUMPECTOMY WITH RADIOACTIVE SEED LOCALIZATION;  Surgeon: Jovita Kussmaul, MD;  Location: [place];  Service: General;  Laterality: Right;  . CARDIAC SURGERY    . CHOLECYSTECTOMY    . COLONOSCOPY  2013  . CORONARY ARTERY BYPASS GRAFT N/A 03/03/2020   Procedure: CORONARY ARTERY BYPASS GRAFTING (CABG), ON PUMP, TIMES FIVE, USING LEFT INTERNAL MAMMARY ARTERY AND ENDOSCOPICALLY HARVESTED BILATERAL GREATER  SAPHENOUS VEINS -flow track only;  Surgeon: Lajuana Matte, MD;  Location: [facility];  Service: Open Heart Surgery;  Laterality: N/A;  . LEFT HEART CATH AND CORONARY ANGIOGRAPHY N/A 03/01/2020   Procedure: LEFT HEART CATH AND CORONARY ANGIOGRAPHY;  Surgeon: Belva Crome, MD;  Location: [place] CV [facility];  Service: Cardiovascular;  Laterality: N/A;  . TEE WITHOUT CARDIOVERSION N/A 03/03/2020   Procedure: TRANSESOPHAGEAL ECHOCARDIOGRAM (TEE);  Surgeon: Lajuana Matte, MD;  Location: [place];  Service: Open Heart Surgery;  Laterality: N/A;    There were no vitals filed for this visit.   Subjective Assessment - 08/23/20 1019    Subjective Patient reports had a visit with Dr. Jaynee Eagles. Reports that appointment went well, and has been cleared to drive once she takes test. No falls or pain to report.    Pertinent History PMH: CVA, diabetes, HTN, hx of COVID, depression, back pain, CABG (03/2020)    Limitations Standing;Walking;House hold activities    Diagnostic tests MRI 03/2020: MRI  Subcentimeter R cerebellar infarct. Moderate small vessel disease and atrophy    Patient Stated Goals wants to be able to work on her walking - walk without her RW like she used to    Currently in Pain? No/denies  [place] Adult PT Treatment/Exercise - 08/23/20 0001      Ambulation/Gait   Ambulation/Gait Yes    Ambulation/Gait Assistance 5: Supervision    Ambulation/Gait Assistance Details completed gait training with [place] with quad tip attachment. Patient able to demonstrate proper sequencing of AD without cueing from PT. No instances of imbalance noted with this AD. Able to ambulate x 460 ft w/ device prior to requiring rest break.     Ambulation Distance (Feet) 460 Feet    Assistive device Straight cane   w/ quad base attachment   Gait Pattern Step-through pattern;Decreased stance time - right    Ambulation Surface Level;Indoor      High Level Balance   High Level  Balance Activities Side stepping;Tandem walking;Marching forwards;Backward walking    High Level Balance Comments At countertop with intermittent UE support completed the following exercises on blue mat: side stepping x 4 laps without UE support, backwards walking x 4 laps without UE support mild imbalance noted with backwards walking, tandem walking x 4 laps initially with UE support progressing to no UE support, and marching forwards x 4 laps. PT providing verbal cues for improved hip/knee flexion iwth marching forward for improved technique. CGA as needed throughout. Completed negotiating over obstacles (hurdles), completed 35' x 5 laps, with CGA from PT. Patietn did require verbal/tactile cues for slowed completion for improved form. PT providing verbal cues for improved hip/knee flexion and avoiding circumduction with completion.                Balance Exercises - 08/23/20 0001      Balance Exercises: Standing   Standing Eyes Opened Narrow base of support (BOS);Foam/compliant surface    Standing Eyes Opened Limitations completed narrow BOS, 2 x 10 reps of horizontal/vertical head turns with eyes open. increased challenge with horizontal head turns    Standing Eyes Closed Narrow base of support (BOS);Foam/compliant surface;4 reps;30 secs    Standing Eyes Closed Limitations on airex pad, narrow BOS with eyes closed. increased balance challenge with vision removed.                PT Short Term Goals - 08/16/20 1109      PT SHORT TERM GOAL #1   Title Pt will be independent with initial HEP in order to build upon functional gains made in therapy. ALL STGS DUE 08/19/20    Baseline pt reports independent with HEP at home    Time 4    Period Weeks    Status Achieved    Target Date 08/19/20      PT SHORT TERM GOAL #2   Title Pt will undergo further assessment of BERG/DGI and LTG written as appropriate.    Baseline 39/56 on 8/23    Time 4    Period Weeks    Status Achieved      PT  SHORT TERM GOAL #3   Title Pt will improve gait speed with LRAD to at least 2.6 ft/sec in order to demo improved community mobility.    Baseline 2.03 ft/sec, 12.13 seconds = 2.70 ft/sec    Time 4    Period Weeks    Status Achieved             PT Long Term Goals - 07/25/20 1155      PT LONG TERM GOAL #1   Title Pt will be independent with final HEP in order to build upon functional gains made in therapy. ALL LTGS DUE 09/16/20    Time  8    Period Weeks    Status New      PT LONG TERM GOAL #2   Title Pt will perform a curb with LRAD with supervision in order to demo improved community mobility.    Time 8    Period Weeks    Status New      PT LONG TERM GOAL #3   Title Pt will decr TUG time to 13 seconds or less with no AD vs. LRAD in order to demo decr fall risk.    Time 8    Period Weeks    Status New      PT LONG TERM GOAL #4   Title Patient will improve Berg Balance to >/= 45/56 to demo improved balance and reduced risk for falls    Baseline 39/56    Time 8    Period Weeks    Status Revised      PT LONG TERM GOAL #5   Title Pt will ambulate at least 300' over outdoor unlevel surfaces with LRAD in order to demo improved community mobility.    Time 8    Period Weeks    Status New                 Plan - 08/23/20 1103    Clinical Impression Statement Continued gait training today with SPC with quad tip attachment with patient demo improved sequencing today requiring supervision from PT with ambulation with AD. Continued balance activites focused on complaint surface and reduced UE support. Will continue to progress toward all unmet goals.    Personal Factors and Comorbidities Age;Past/Current Experience;Comorbidity 3+;Time since onset of injury/illness/exacerbation    Comorbidities PMH: CVA, diabetes, HTN, hx of COVID, depression, back pain, CABG (03/2020)    Examination-Activity Limitations Squat;Stairs;Locomotion Level;Bathing;Transfers     Examination-Participation Restrictions Community Activity;Driving;Yard Work    Stability/Clinical Decision Making Stable/Uncomplicated    Rehab Potential Good    PT Frequency 1x / week   due to co pay   PT Duration 8 weeks    PT Treatment/Interventions ADLs/Self Care Home Management;Therapeutic activities;Patient/family education;Cryotherapy;Functional mobility training;Neuromuscular re-education;Moist Heat;Therapeutic exercise;Balance training    PT Next Visit Plan continue gait training with SPC with quad tip. Continued balance exercises. ambulation w/o AD, gait with head turns. High level balance on blue mat.    Consulted and Agree with Plan of Care Patient   pt's friend          Patient will benefit from skilled therapeutic intervention in order to improve the following deficits and impairments:  Cardiopulmonary status limiting activity, Decreased mobility, Decreased activity tolerance, Decreased strength, Difficulty walking, Decreased balance, Abnormal gait, Decreased coordination  Visit Diagnosis: Unsteadiness on feet  Muscle weakness (generalized)  Other abnormalities of gait and mobility     Problem List Patient Active Problem List   Diagnosis Date Noted  . Memory loss 08/22/2020  . Dementia with behavioral disturbance ([place]) 08/22/2020  . Coronary artery disease 05/17/2020  . Pleural effusion 04/12/2020  . Atrial fibrillation ([place]) 03/11/2020  . S/P CABG (coronary artery bypass graft) 03/03/2020  . Cerebral thrombosis with cerebral infarction 03/02/2020  . Hyperlipidemia associated with type 2 diabetes mellitus ([place]) 02/09/2020  . Type 2 diabetes mellitus, controlled ([place]) 01/12/2020  . Class 2 severe obesity with serious comorbidity and body mass index (BMI) of 35.0 to 35.9 in adult, unspecified obesity type ([place]) 12/25/2018  . Obstructive sleep apnea 11/04/2017  . Lumbar stenosis with neurogenic claudication 11/19/2016  . HTN (hypertension)  03/21/2016  . Anxiety     . Depression     Jones Bales, PT, DPT 08/23/2020, 11:04 AM  [facility] [address] [place], [place], [address] Phone: [phone]   Fax:  [phone]  Name: VENISE ELLINGWOOD MRN: [id number] Date of Birth: 1941/10/14

## 2020-08-24 ENCOUNTER — Telehealth: Payer: Self-pay | Admitting: *Deleted

## 2020-08-24 ENCOUNTER — Ambulatory Visit: Payer: Medicare Other

## 2020-08-24 ENCOUNTER — Telehealth: Payer: Self-pay | Admitting: Neurology

## 2020-08-24 NOTE — Telephone Encounter (Signed)
-----   Message from Melvenia Beam, MD sent at 08/23/2020  9:29 AM EDT ----- Significant B12 deficiency 185. This can cause or worsen dementia. 103mcg b12 shots weekly for 4 weeks and then once a month for 6 months. Followup with pcp for evaluation of caues. We can perofrm the shots here or I'm sure her pcp can also perform them if that is more convenient. Thanks (Dr. Daisy Blossom)

## 2020-08-24 NOTE — Telephone Encounter (Signed)
[employer] medicare pending faxed notes  

## 2020-08-24 NOTE — Telephone Encounter (Signed)
Left message for friend Juliann Pulse (friend) on DPR to call back for further information regarding test results.

## 2020-08-25 NOTE — Telephone Encounter (Signed)
Tried to call Juliann Pulse again and left voice mail.

## 2020-08-26 ENCOUNTER — Encounter: Payer: Self-pay | Admitting: Psychology

## 2020-08-26 NOTE — Telephone Encounter (Signed)
[name],Judy(son on DPR) returned the call to [place] please call him back

## 2020-08-27 NOTE — Telephone Encounter (Signed)
Were we able to get this message to patient? If not let's write a letter thanks   Significant B12 deficiency 185. This can cause or worsen dementia. 1059mcg b12 shots weekly for 4 weeks and then once a month for 6 months. Followup with pcp for evaluation of caues. We can perofrm the shots here or I'm sure her pcp can also perform them if that is more convenient. Thanks (Dr. Daisy Blossom)

## 2020-08-28 LAB — B12 AND FOLATE PANEL
Folate: 9.1 ng/mL (ref >3.0)
Vitamin B-12: 185 pg/mL — ABNORMAL LOW (ref 232–1245)

## 2020-08-28 LAB — VITAMIN B6: Vitamin B6: 2.9 ug/L (ref 2.0–32.8)

## 2020-08-28 LAB — TSH: TSH: 1.67 u[IU]/mL (ref 0.450–4.500)

## 2020-08-28 LAB — METHYLMALONIC ACID, SERUM: Methylmalonic Acid: 2435 nmol/L — ABNORMAL HIGH (ref 0–378)

## 2020-08-28 LAB — HOMOCYSTEINE: Homocysteine: 21 umol/L — ABNORMAL HIGH (ref 0.0–19.2)

## 2020-08-28 LAB — VITAMIN B1: Thiamine: 172.9 nmol/L (ref 66.5–200.0)

## 2020-08-28 NOTE — Telephone Encounter (Signed)
Judy [name]: B6 also slightly low. Since we have not heard from patient and it appears she may have some difficulty getting here because she depends on others to drive her I think we could prescribe her Foltx that has B12 and B6 in it along with b12. We can prescribe that for her it may be more reliable than asking her to come get injections. And she can have it checked again at next appointment with [name] or pcp thanks    (Prior: Significant B12 deficiency 185. This can cause or worsen dementia. 1052mcg b12 shots weekly for 4 weeks and then once a month for 6 months. Followup with pcp for evaluation of caues. We can perofrm the shots here or I'm sure her pcp can also perform them if that is more convenient. Thanks (Dr. Daisy Blossom))

## 2020-08-29 NOTE — Telephone Encounter (Signed)
Returned call to patient's son Leroy Sea.  Discussed patient's B12 deficiency and recommended treatment.   Son felt it would be easier to have injections done through PCP.  Information sent to patient's PCP.

## 2020-08-29 NOTE — Telephone Encounter (Signed)
Will continue documentation in the 08/24/20 phone note for the lab results.

## 2020-08-29 NOTE — Telephone Encounter (Signed)
Judy [name],Judy [name] has returned the call to [facility], please call her back.

## 2020-08-29 NOTE — Telephone Encounter (Signed)
From Dr Jaynee Eagles: "B6 also slightly low. Since we have not heard from patient and it appears she may have some difficulty getting here because she depends on others to drive her I think we could prescribe her Foltx that has B12 and B6 in it along with b12. We can prescribe that for her it may be more reliable than asking her to come get injections. And she can have it checked again at next appointment with [name] or pcp thanks    (Prior: Significant B12 deficiency 185. This can cause or worsen dementia. 104mcg b12 shots weekly for 4 weeks and then once a month for 6 months. Followup with pcp for evaluation of caues. We can perofrm the shots here or I'm sure her pcp can also perform them if that is more convenient. Thanks (Dr. Jacalyn Lefevre fyi))"  I called the pt's roommate Judy [name] (on [facility]), who it states handles her medications. Was unable to reach her.

## 2020-08-29 NOTE — Telephone Encounter (Deleted)
Please see other phone note from Dr Jaynee Eagles regarding vitamin deficiencies. I will document in other phone note.

## 2020-08-29 NOTE — Telephone Encounter (Signed)
Called pt's roommate Rowan Blase (on DPR) and LVM asking for call back.

## 2020-08-29 NOTE — Telephone Encounter (Signed)
I called Juliann Pulse (on DPR) and LVM asking for call back. Left office number in message.

## 2020-08-30 ENCOUNTER — Ambulatory Visit: Payer: Medicare Other

## 2020-08-30 ENCOUNTER — Other Ambulatory Visit: Payer: Self-pay

## 2020-08-30 DIAGNOSIS — M6281 Muscle weakness (generalized): Secondary | ICD-10-CM

## 2020-08-30 DIAGNOSIS — R2681 Unsteadiness on feet: Secondary | ICD-10-CM | POA: Diagnosis not present

## 2020-08-30 DIAGNOSIS — Z9181 History of falling: Secondary | ICD-10-CM

## 2020-08-30 DIAGNOSIS — R2689 Other abnormalities of gait and mobility: Secondary | ICD-10-CM

## 2020-08-30 NOTE — Therapy (Signed)
[facility] [address] [place], [place], [address] Phone: [phone]   Fax:  [phone]  Physical Therapy Treatment  Patient Details  Name: Judy [name] MRN: [id number] Date of Birth: Mar 02, 1941 Referring Provider (PT): [name], MD   Encounter Date: 08/30/2020   PT End of Session - 08/30/20 1010    Visit Number 7    Number of Visits 9    Date for PT Re-Evaluation 10/20/20   written for 60 day POC   Authorization Type [id number] $30 copay (Collect 1 per disc, per day)    PT Start Time 1010    PT Stop Time 1054    PT Time Calculation (min) 44 min    Equipment Utilized During Treatment Gait belt    Activity Tolerance Patient tolerated treatment well    Behavior During Therapy [facility] for tasks assessed/performed           Past Medical History:  Diagnosis Date  . Anxiety   . Back pain   . Complication of anesthesia 2003   gallbladder surgery-resp. arrest-stop operation and pt. went to ICU - patient stated she has back surgery since then and had no trouble at all  . Depression   . Diabetes ([place])   . Dyspnea   . Gout   . Heart attack [facility])    March 2021  . History of kidney stones   . Hypertension   . Leg edema   . Pancreatitis, chronic ([place]) 03/2016  . Pneumonia due to COVID-19 virus 12/03/2019  . Sleep apnea   . Stroke [facility])    March 2021    Past Surgical History:  Procedure Laterality Date  . BACK SURGERY    . BREAST LUMPECTOMY WITH RADIOACTIVE SEED LOCALIZATION Right 10/23/2019   Procedure: RIGHT BREAST LUMPECTOMY WITH RADIOACTIVE SEED LOCALIZATION;  Surgeon: Jovita Kussmaul, MD;  Location: [facility];  Service: General;  Laterality: Right;  . CARDIAC SURGERY    . CHOLECYSTECTOMY    . COLONOSCOPY  2013  . CORONARY ARTERY BYPASS GRAFT N/A 03/03/2020   Procedure: CORONARY ARTERY BYPASS GRAFTING (CABG), ON PUMP, TIMES FIVE, USING LEFT INTERNAL MAMMARY ARTERY AND ENDOSCOPICALLY HARVESTED BILATERAL GREATER  SAPHENOUS VEINS -flow track only;  Surgeon: Lajuana Matte, MD;  Location: [place];  Service: Open Heart Surgery;  Laterality: N/A;  . LEFT HEART CATH AND CORONARY ANGIOGRAPHY N/A 03/01/2020   Procedure: LEFT HEART CATH AND CORONARY ANGIOGRAPHY;  Surgeon: Belva Crome, MD;  Location: [place] CV LAB;  Service: Cardiovascular;  Laterality: N/A;  . TEE WITHOUT CARDIOVERSION N/A 03/03/2020   Procedure: TRANSESOPHAGEAL ECHOCARDIOGRAM (TEE);  Surgeon: Lajuana Matte, MD;  Location: [place];  Service: Open Heart Surgery;  Laterality: N/A;    There were no vitals filed for this visit.   Subjective Assessment - 08/30/20 1012    Subjective Patient reports been doing well since last visit. No falls to report. Patient reports that she is having cataract surgery on thursday. Patient reports balance has been feeling good.    Pertinent History PMH: CVA, diabetes, HTN, hx of COVID, depression, back pain, CABG (03/2020)    Limitations Standing;Walking;House hold activities    Diagnostic tests MRI 03/2020: MRI  Subcentimeter R cerebellar infarct. Moderate small vessel disease and atrophy    Patient Stated Goals wants to be able to work on her walking - walk without her RW like she used to    Currently in Pain? No/denies  [facility] Adult PT Treatment/Exercise - 08/30/20 0001      Ambulation/Gait   Ambulation/Gait Yes    Ambulation/Gait Assistance 5: Supervision;4: Min guard    Ambulation/Gait Assistance Details completed ambulation outdoors/indoors, patient demo proper sequencing of AD with ambulation. Patient requiring supervision with ambulaltion throughout. no instances of imbalance noted. Moderate shortness of breath after completion, 7/10. O2sat: 96%, HR: 99    Ambulation Distance (Feet) 550 Feet    Assistive device Straight cane    Gait Pattern Step-through pattern;Decreased stance time - right    Ambulation Surface Level;Indoor;Unlevel;Outdoor;Paved      Curb 5: Supervision;Other (comment)   CGA   Curb Details (indicate cue type and reason) completed ascending/descending curb outdoors with [facility] with quad tip attachment. PT providing verbal cues for sequencing. Completed x 2 reps, initially sway noted on completion of first rep, but demo improvemetns with second repetition.       High Level Balance   High Level Balance Activities Side stepping;Backward walking;Marching forwards    High Level Balance Comments In hallway completed the following exercises with intermittent CGA as needed: side stepping 3 x 30', marching forwards 3 x 30', and backwards walking 3 x 30'. To further add challenge, included dual tasking actviity including naming states/vegetables/fruits, and counting backwards by 2's. Increased difficulty noted with dual tasking demonstrating slowed gait speed/completion of task. Patient required seated break after completion.       Neuro Re-ed    Neuro Re-ed Details  Completed gait with horizontal/vertical head turns, completed 1 x 115 ft each. CGA as required. Completed without AD.                Balance Exercises - 08/30/20 0001      Balance Exercises: Standing   Other Standing Exercises Standing on incline facing upward: completed eyes closed with feet shoulder width apart, 3 x 30 seconds, increased sway noted. Progressed to feet together with eyes open, completed horizontal/vertical head turns 1 x 15 reps each. Then completed alternating marching with eyes open x 15 reps, without UE support and CGA. Patient demo increased difficulty and balance challenge with alteranting marching. CGA requiring intermittently.                PT Short Term Goals - 08/16/20 1109      PT SHORT TERM GOAL #1   Title Pt will be independent with initial HEP in order to build upon functional gains made in therapy. ALL STGS DUE 08/19/20    Baseline pt reports independent with HEP at home    Time 4    Period Weeks    Status Achieved    Target  Date 08/19/20      PT SHORT TERM GOAL #2   Title Pt will undergo further assessment of BERG/DGI and LTG written as appropriate.    Baseline 39/56 on 8/23    Time 4    Period Weeks    Status Achieved      PT SHORT TERM GOAL #3   Title Pt will improve gait speed with LRAD to at least 2.6 ft/sec in order to demo improved community mobility.    Baseline 2.03 ft/sec, 12.13 seconds = 2.70 ft/sec    Time 4    Period Weeks    Status Achieved             PT Long Term Goals - 07/25/20 1155      PT LONG TERM GOAL #1   Title Pt will be independent with  final HEP in order to build upon functional gains made in therapy. ALL LTGS DUE 09/16/20    Time 8    Period Weeks    Status New      PT LONG TERM GOAL #2   Title Pt will perform a curb with LRAD with supervision in order to demo improved community mobility.    Time 8    Period Weeks    Status New      PT LONG TERM GOAL #3   Title Pt will decr TUG time to 13 seconds or less with no AD vs. LRAD in order to demo decr fall risk.    Time 8    Period Weeks    Status New      PT LONG TERM GOAL #4   Title Patient will improve Berg Balance to >/= 45/56 to demo improved balance and reduced risk for falls    Baseline 39/56    Time 8    Period Weeks    Status Revised      PT LONG TERM GOAL #5   Title Pt will ambulate at least 300' over outdoor unlevel surfaces with LRAD in order to demo improved community mobility.    Time 8    Period Weeks    Status New                 Plan - 08/30/20 1051    Clinical Impression Statement Today's skilled PT session included gait training on outdoor surfaces with SPC with quad tip attachment, patient require supervision and CGA with ascending/descending curb. Continued high level balance actvities with ambulation with addition of dual task, patient tolerating well. Patient require intermittent rest breaks due to fatigue. Will continue to progress toward all goals.    Personal Factors and  Comorbidities Age;Past/Current Experience;Comorbidity 3+;Time since onset of injury/illness/exacerbation    Comorbidities PMH: CVA, diabetes, HTN, hx of COVID, depression, back pain, CABG (03/2020)    Examination-Activity Limitations Squat;Stairs;Locomotion Level;Bathing;Transfers    Examination-Participation Restrictions Community Activity;Driving;Yard Work    Stability/Clinical Decision Making Stable/Uncomplicated    Rehab Potential Good    PT Frequency 1x / week   due to co pay   PT Duration 8 weeks    PT Treatment/Interventions ADLs/Self Care Home Management;Therapeutic activities;Patient/family education;Cryotherapy;Functional mobility training;Neuromuscular re-education;Moist Heat;Therapeutic exercise;Balance training    PT Next Visit Plan Review HEP and update. Continued balance exercises. ambulation w/o AD, gait with head turns. High level balance on blue mat.    Consulted and Agree with Plan of Care Patient   pt's friend          Patient will benefit from skilled therapeutic intervention in order to improve the following deficits and impairments:  Cardiopulmonary status limiting activity, Decreased mobility, Decreased activity tolerance, Decreased strength, Difficulty walking, Decreased balance, Abnormal gait, Decreased coordination  Visit Diagnosis: Unsteadiness on feet  Muscle weakness (generalized)  Other abnormalities of gait and mobility  History of falling     Problem List Patient Active Problem List   Diagnosis Date Noted  . Memory loss 08/22/2020  . Dementia with behavioral disturbance ([place]) 08/22/2020  . Coronary artery disease 05/17/2020  . Pleural effusion 04/12/2020  . Atrial fibrillation ([place]) 03/11/2020  . S/P CABG (coronary artery bypass graft) 03/03/2020  . Cerebral thrombosis with cerebral infarction 03/02/2020  . Hyperlipidemia associated with type 2 diabetes mellitus ([place]) 02/09/2020  . Type 2 diabetes mellitus, controlled ([place]) 01/12/2020  . Class  2 severe obesity with serious comorbidity and body  mass index (BMI) of 35.0 to 35.9 in adult, unspecified obesity type ([place]) 12/25/2018  . Obstructive sleep apnea 11/04/2017  . Lumbar stenosis with neurogenic claudication 11/19/2016  . HTN (hypertension) 03/21/2016  . Anxiety   . Depression     Jones Bales, PT, DPT 08/30/2020, 10:58 AM  [facility] [address] [place], [place], [address] Phone: [phone]   Fax:  [phone]  Name: Judy [name] MRN: [id number] Date of Birth: 1941-07-17

## 2020-08-30 NOTE — Telephone Encounter (Signed)
The son Leroy Sea has called Bethany,RN back, please call.

## 2020-08-30 NOTE — Telephone Encounter (Signed)
I returned Judy [name]'s call. He is aware of Judy other folks on [place]. He prefers we contact him. He lives close to patient. He stated he is in Judy process of contacting PCP to schedule B12 shots for Judy [name] (once a week for a month then monthly) and will hopefully do this tomorrow. He is also aware that Judy [name]'s B6 is low and Dr Jaynee Eagles recommends Judy patient take an OTC MVI with B6 daily. He is aware further management of B6, B12 is needed by PCP. He verbalized appreciation for Judy call.   -During Judy call I spoke with Dr Jaynee Eagles and let her know he was already working on B12 injection setup at PCP office. She recommended MVI w/ B6 OTC daily and further management by PCP.

## 2020-08-31 ENCOUNTER — Ambulatory Visit: Payer: Medicare Other | Admitting: Physical Therapy

## 2020-08-31 NOTE — Telephone Encounter (Signed)
Patient son called to check on the status of the PET scan. I informed him that it is still pending and if it gets approved he would get a call to get the schedule and it would be done at the hospital. I also informed him that sometimes the insurance do not approve those but right now it is in a pending status.

## 2020-08-31 NOTE — Telephone Encounter (Signed)
Patient son Judy [name] called stating that her PCP never got the paperwork from our office about doing the B12 shots for the patient.

## 2020-08-31 NOTE — Telephone Encounter (Signed)
I faxed the labs and recommendations to Dr Andres Ege (PCP) office [phone] via routing option in [place]. Called pt's son Leroy Sea and let him know these were sent. He verbalized appreciation.

## 2020-08-31 NOTE — Telephone Encounter (Signed)
Checked the status of the PET scan. It is in now in MD review. They did receive my fax of clinical notes.

## 2020-09-01 ENCOUNTER — Ambulatory Visit: Payer: Medicare Other | Admitting: Physician Assistant

## 2020-09-01 NOTE — Telephone Encounter (Signed)
[place]: [id number] (exp. 08/31/20 to 10/15/20).

## 2020-09-01 NOTE — Telephone Encounter (Signed)
Noted Thanks Raquel Sarna PET Scan sent

## 2020-09-06 ENCOUNTER — Other Ambulatory Visit: Payer: Self-pay

## 2020-09-06 ENCOUNTER — Ambulatory Visit: Payer: Medicare Other | Attending: Internal Medicine

## 2020-09-06 DIAGNOSIS — Z9181 History of falling: Secondary | ICD-10-CM

## 2020-09-06 DIAGNOSIS — R2689 Other abnormalities of gait and mobility: Secondary | ICD-10-CM | POA: Diagnosis present

## 2020-09-06 DIAGNOSIS — R2681 Unsteadiness on feet: Secondary | ICD-10-CM

## 2020-09-06 DIAGNOSIS — M6281 Muscle weakness (generalized): Secondary | ICD-10-CM | POA: Insufficient documentation

## 2020-09-06 NOTE — Patient Instructions (Addendum)
            Access Code: [email or web address] URL: [email or web address] Date: 09/06/2020 Prepared by: Baldomero Lamy  Exercises Sit to Stand - 1 x daily - 5 x weekly - 3 sets - 10 reps Wide Stance with Eyes Closed on Foam Pad - 1 x daily - 5 x weekly - 1 sets - 3 reps - 30 hold Standing with Head Rotation on Pillow - 1 x daily - 5 x weekly - 2 sets - 10 reps Standing with Head Nod on Pillow - 1 x daily - 5 x weekly - 2 sets - 10 reps Tandem Stance - 1 x daily - 5 x weekly - 1 sets - 3 reps - 30 hold Walking March with Countertop Support - 1 x daily - 5 x weekly - 3 sets - 10 reps Tandem Walking with Counter Support - 1 x daily - 5 x weekly - 3 sets - 10 reps Side Stepping with Counter Support - 1 x daily - 5 x weekly - 3 sets - 10 reps

## 2020-09-06 NOTE — Therapy (Signed)
[facility] [address] [place], [place], [address] Phone: [phone]   Fax:  [phone]  Physical Therapy Treatment  Patient Details  Name: Judy [name] MRN: [id number] Date of Birth: 12/17/1940 Referring Provider (PT): [name], MD   Encounter Date: 09/06/2020   PT End of Session - 09/06/20 1022    Visit Number 8    Number of Visits 9    Date for PT Re-Evaluation 10/20/20   written for 60 day POC   Authorization Type [id number] $30 copay (Collect 1 per disc, per day)    PT Start Time 1018    PT Stop Time 1100    PT Time Calculation (min) 42 min    Equipment Utilized During Treatment Gait belt    Activity Tolerance Patient tolerated treatment well    Behavior During Therapy [facility] for tasks assessed/performed           Past Medical History:  Diagnosis Date   Anxiety    Back pain    Complication of anesthesia 2003   gallbladder surgery-resp. arrest-stop operation and pt. went to ICU - patient stated she has back surgery since then and had no trouble at all   Depression    Diabetes [facility])    Dyspnea    Gout    Heart attack [facility])    March 2021   History of kidney stones    Hypertension    Leg edema    Pancreatitis, chronic ([place]) 03/2016   Pneumonia due to COVID-19 virus 12/03/2019   Sleep apnea    Stroke [facility])    March 2021    Past Surgical History:  Procedure Laterality Date   BACK SURGERY     BREAST LUMPECTOMY WITH RADIOACTIVE SEED LOCALIZATION Right 10/23/2019   Procedure: RIGHT BREAST LUMPECTOMY WITH RADIOACTIVE SEED LOCALIZATION;  Surgeon: Jovita Kussmaul, MD;  Location: [place];  Service: General;  Laterality: Right;   CARDIAC SURGERY     CHOLECYSTECTOMY     COLONOSCOPY  2013   CORONARY ARTERY BYPASS GRAFT N/A 03/03/2020   Procedure: CORONARY ARTERY BYPASS GRAFTING (CABG), ON PUMP, TIMES FIVE, USING LEFT INTERNAL MAMMARY ARTERY AND ENDOSCOPICALLY HARVESTED BILATERAL GREATER  SAPHENOUS VEINS -flow track only;  Surgeon: Lajuana Matte, MD;  Location: [place];  Service: Open Heart Surgery;  Laterality: N/A;   LEFT HEART CATH AND CORONARY ANGIOGRAPHY N/A 03/01/2020   Procedure: LEFT HEART CATH AND CORONARY ANGIOGRAPHY;  Surgeon: Belva Crome, MD;  Location: [place] CV LAB;  Service: Cardiovascular;  Laterality: N/A;   TEE WITHOUT CARDIOVERSION N/A 03/03/2020   Procedure: TRANSESOPHAGEAL ECHOCARDIOGRAM (TEE);  Surgeon: Lajuana Matte, MD;  Location: [place];  Service: Open Heart Surgery;  Laterality: N/A;    There were no vitals filed for this visit.   Subjective Assessment - 09/06/20 1020    Subjective Patient reports that she had cataract surgery last thursday on the R eye, is having another surgery for L eye in two weeks. Patient has been having flare up with arthritis in hands. Patient reports no falls.    Pertinent History PMH: CVA, diabetes, HTN, hx of COVID, depression, back pain, CABG (03/2020)    Limitations Standing;Walking;House hold activities    Diagnostic tests MRI 03/2020: MRI  Subcentimeter R cerebellar infarct. Moderate small vessel disease and atrophy    Patient Stated Goals wants to be able to work on her walking - walk without her RW like she used to    Currently in  Pain? Yes    Pain Score 8     Pain Location Hand    Pain Orientation Left;Right    Pain Descriptors / Indicators Shooting;Aching    Pain Type Chronic pain    Pain Radiating Towards into fingers    Pain Onset In the past 7 days    Pain Frequency Intermittent    Pain Relieving Factors cream                             [facility] Adult PT Treatment/Exercise - 09/06/20 0001      Ambulation/Gait   Ambulation/Gait Yes    Ambulation/Gait Assistance 5: Supervision    Ambulation/Gait Assistance Details Patient continues to ambulate into/out of therapy session with RW. During session completed gait training with SPC with quad tip attachment x 350 ft, patient able  to sequentce properly and no instances of imbalance. Patient reports feeling more comfortable with AD. Completed gait training x 250 without supervision, no instances of imbalance. PT educating on purchase on SPC to allow for use indoors as well as outdoors (short distances). PT educating on use of RW for increased distances outdoors due to decreased endurance. Patient has demonstrated improved stability and balance with ambulation. PT providing handout for purchase options for SPC and quad tip attachment. Educating that if purchased prior to next session, patient can bring into session and adjust to patient's height. PT educating if not, that it should be at wrist level for proper height.     Ambulation Distance (Feet) 350 Feet   x 1, 200 x 1    Assistive device Straight cane;None    Gait Pattern Step-through pattern;Decreased stance time - right    Ambulation Surface Level;Indoor      Neuro Re-ed    Neuro Re-ed Details  Completed entire review and progression of balance exercises as tolerated by patient. See medbridge program for details below.             Access Code: [email or web address] URL: [email or web address] Date: 09/06/2020 Prepared by: Baldomero Lamy  Exercises Sit to Stand - 1 x daily - 5 x weekly - 3 sets - 10 reps Wide Stance with Eyes Closed on Foam Pad - 1 x daily - 5 x weekly - 1 sets - 3 reps - 30 hold Standing with Head Rotation on Pillow - 1 x daily - 5 x weekly - 2 sets - 10 reps Standing with Head Nod on Pillow - 1 x daily - 5 x weekly - 2 sets - 10 reps Tandem Stance - 1 x daily - 5 x weekly - 1 sets - 3 reps - 30 hold Walking March with Countertop Support - 1 x daily - 5 x weekly - 3 sets - 10 reps Tandem Walking with Counter Support - 1 x daily - 5 x weekly - 3 sets - 10 reps Side Stepping with Counter Support - 1 x daily - 5 x weekly - 3 sets - 10 reps         PT Short Term Goals - 08/16/20 1109      PT SHORT TERM GOAL #1   Title Pt will be  independent with initial HEP in order to build upon functional gains made in therapy. ALL STGS DUE 08/19/20    Baseline pt reports independent with HEP at home    Time 4    Period Weeks    Status Achieved    Target Date 08/19/20  PT SHORT TERM GOAL #2   Title Pt will undergo further assessment of BERG/DGI and LTG written as appropriate.    Baseline 39/56 on 8/23    Time 4    Period Weeks    Status Achieved      PT SHORT TERM GOAL #3   Title Pt will improve gait speed with LRAD to at least 2.6 ft/sec in order to demo improved community mobility.    Baseline 2.03 ft/sec, 12.13 seconds = 2.70 ft/sec    Time 4    Period Weeks    Status Achieved             PT Long Term Goals - 07/25/20 1155      PT LONG TERM GOAL #1   Title Pt will be independent with final HEP in order to build upon functional gains made in therapy. ALL LTGS DUE 09/16/20    Time 8    Period Weeks    Status New      PT LONG TERM GOAL #2   Title Pt will perform a curb with LRAD with supervision in order to demo improved community mobility.    Time 8    Period Weeks    Status New      PT LONG TERM GOAL #3   Title Pt will decr TUG time to 13 seconds or less with no AD vs. LRAD in order to demo decr fall risk.    Time 8    Period Weeks    Status New      PT LONG TERM GOAL #4   Title Patient will improve Berg Balance to >/= 45/56 to demo improved balance and reduced risk for falls    Baseline 39/56    Time 8    Period Weeks    Status Revised      PT LONG TERM GOAL #5   Title Pt will ambulate at least 300' over outdoor unlevel surfaces with LRAD in order to demo improved community mobility.    Time 8    Period Weeks    Status New                 Plan - 09/06/20 1105    Clinical Impression Statement Today's skilled PT session included continued gait training with SPC with quad tip attachment as well as ambulation without AD. PT educating on use of SPC for ambulation vs RW as patient  demonstrates improved stability and balance. Purchase options for [facility] provided for patient. Rest of session spent reviewing and progressing balance HEP as tolerated by patient. Will continue to progress toward all goals.    Personal Factors and Comorbidities Age;Past/Current Experience;Comorbidity 3+;Time since onset of injury/illness/exacerbation    Comorbidities PMH: CVA, diabetes, HTN, hx of COVID, depression, back pain, CABG (03/2020)    Examination-Activity Limitations Squat;Stairs;Locomotion Level;Bathing;Transfers    Examination-Participation Restrictions Community Activity;Driving;Yard Work    Stability/Clinical Decision Making Stable/Uncomplicated    Rehab Potential Good    PT Frequency 1x / week   due to co pay   PT Duration 8 weeks    PT Treatment/Interventions ADLs/Self Care Home Management;Therapeutic activities;Patient/family education;Cryotherapy;Functional mobility training;Neuromuscular re-education;Moist Heat;Therapeutic exercise;Balance training    PT Next Visit Plan How was HEP Update? Did we get SPC/quad tip attachment? (adjust to height if patient brings in). Check LTG. Re-cert for 1x/week if needed, if able to meet goals next visit can discharge.    Consulted and Agree with Plan of Care Patient   pt's friend  Patient will benefit from skilled therapeutic intervention in order to improve the following deficits and impairments:  Cardiopulmonary status limiting activity, Decreased mobility, Decreased activity tolerance, Decreased strength, Difficulty walking, Decreased balance, Abnormal gait, Decreased coordination  Visit Diagnosis: Unsteadiness on feet  Muscle weakness (generalized)  Other abnormalities of gait and mobility  History of falling     Problem List Patient Active Problem List   Diagnosis Date Noted   Memory loss 08/22/2020   Dementia with behavioral disturbance ([place]) 08/22/2020   Coronary artery disease 05/17/2020   Pleural effusion  04/12/2020   Atrial fibrillation ([place]) 03/11/2020   S/P CABG (coronary artery bypass graft) 03/03/2020   Cerebral thrombosis with cerebral infarction 03/02/2020   Hyperlipidemia associated with type 2 diabetes mellitus ([place]) 02/09/2020   Type 2 diabetes mellitus, controlled ([place]) 01/12/2020   Class 2 severe obesity with serious comorbidity and body mass index (BMI) of 35.0 to 35.9 in adult, unspecified obesity type ([place]) 12/25/2018   Obstructive sleep apnea 11/04/2017   Lumbar stenosis with neurogenic claudication 11/19/2016   HTN (hypertension) 03/21/2016   Anxiety    Depression     Jones Bales, PT, DPT 09/06/2020, 11:09 AM  [facility] [address] [place], [place], [address] Phone: [phone]   Fax:  [phone]  Name: MATYLDA FEHRING MRN: [id number] Date of Birth: Oct 07, 1941

## 2020-09-07 ENCOUNTER — Ambulatory Visit: Payer: Medicare Other

## 2020-09-08 NOTE — Telephone Encounter (Signed)
I returned [name] call. They had not received the results I faxed. I told her I would fax again. Judy [name] asked what the B12 result was and I provided the information. Results faxed to [phone] (number provided by [facility]). Received a receipt of confirmation.

## 2020-09-08 NOTE — Telephone Encounter (Signed)
[name]) called son called and said she needs a B12 injection. Do she need the B12 injection. Would like a call from the nurse. Speak with me directly do not leave a voicemail.

## 2020-09-09 ENCOUNTER — Other Ambulatory Visit: Payer: Self-pay | Admitting: Critical Care Medicine

## 2020-09-09 ENCOUNTER — Other Ambulatory Visit: Payer: Self-pay

## 2020-09-09 ENCOUNTER — Ambulatory Visit
Admission: RE | Admit: 2020-09-09 | Discharge: 2020-09-09 | Disposition: A | Discharge status: Home / Self Care | Payer: Medicare Other | Source: Ambulatory Visit | Attending: Neurology | Admitting: Neurology

## 2020-09-09 DIAGNOSIS — F0391 Unspecified dementia with behavioral disturbance: Secondary | ICD-10-CM

## 2020-09-09 DIAGNOSIS — R4189 Other symptoms and signs involving cognitive functions and awareness: Secondary | ICD-10-CM

## 2020-09-09 DIAGNOSIS — R269 Unspecified abnormalities of gait and mobility: Secondary | ICD-10-CM

## 2020-09-09 DIAGNOSIS — R413 Other amnesia: Secondary | ICD-10-CM

## 2020-09-09 DIAGNOSIS — G309 Alzheimer's disease, unspecified: Secondary | ICD-10-CM

## 2020-09-09 DIAGNOSIS — R4689 Other symptoms and signs involving appearance and behavior: Secondary | ICD-10-CM

## 2020-09-09 MED ORDER — GADOBENATE DIMEGLUMINE 529 MG/ML IV SOLN
15.0000 mL | Freq: Once | INTRAVENOUS | Status: AC | PRN
  Administered 2020-09-09: 15 mL via INTRAVENOUS

## 2020-09-09 NOTE — Telephone Encounter (Signed)
Requested medication (s) are due for refill today: Yes  Requested medication (s) are on the active medication list: Yes  Last refill:  04/04/20  Future visit scheduled: No  Notes to clinic:  See request.    Requested Prescriptions  Pending Prescriptions Disp Refills   JARDIANCE 10 MG TABS tablet [Pharmacy Med Name: JARDIANCE 10MG TABLETS] 30 tablet 4    Sig: TAKE 1 TABLET BY MOUTH DAILY      Endocrinology:  Diabetes - SGLT2 Inhibitors Failed - 09/09/2020  8:03 AM      Failed - Cr in normal range and within 360 days    Creatinine, Ser  Date Value Ref Range Status  06/20/2020 1.02 (H) 0.44 - 1.00 mg/dL Final          Passed - LDL in normal range and within 360 days    LDL Calculated  Date Value Ref Range Status  06/19/2018 107 (H) 0 - 99 mg/dL Final   LDL Cholesterol  Date Value Ref Range Status  03/01/2020 30 0 - 99 mg/dL Final    Comment:           Total Cholesterol/HDL:CHD Risk Coronary Heart Disease Risk Table                     Men   Women  1/2 Average Risk   3.4   3.3  Average Risk       5.0   4.4  2 X Average Risk   9.6   7.1  3 X Average Risk  23.4   11.0        Use the calculated Patient Ratio above and the CHD Risk Table to determine the patient's CHD Risk.        ATP III CLASSIFICATION (LDL):  <100     mg/dL   Optimal  100-129  mg/dL   Near or Above                    Optimal  130-159  mg/dL   Borderline  160-189  mg/dL   High  >190     mg/dL   Very High Performed at [facility] [address] [place], [place] [address]           Passed - HBA1C is between 0 and 7.9 and within 180 days    Hemoglobin A1C  Date Value Ref Range Status  05/17/2020 6.2 (A) 4.0 - 5.6 % Final   Hgb A1c MFr Bld  Date Value Ref Range Status  02/09/2020 7.1 (H) 4.8 - 5.6 % Final    Comment:             Prediabetes: 5.7 - 6.4          Diabetes: >6.4          Glycemic control for adults with diabetes: <7.0           Passed - eGFR in normal range and  within 360 days    GFR calc Af Amer  Date Value Ref Range Status  06/20/2020 >60 >60 mL/min Final   GFR calc non Af Amer  Date Value Ref Range Status  06/20/2020 53 (L) >60 mL/min Final          Passed - Valid encounter within last 6 months    Recent Outpatient Visits           3 months ago Controlled type 2 diabetes mellitus with other circulatory complication, with  long-term current use of insulin ([place])   [place] Elsie Stain, MD   5 months ago Controlled type 2 diabetes mellitus with other circulatory complication, with long-term current use of insulin [facility])   [place] Elsie Stain, MD   5 months ago Controlled type 2 diabetes mellitus with other circulatory complication, with long-term current use of insulin [facility])   [place] Elsie Stain, MD   7 months ago Type 2 diabetes mellitus with stage 3a chronic kidney disease, without long-term current use of insulin ([place])   [place] Elsie Stain, MD   8 months ago Pneumonia due to COVID-19 virus   [place] Elsie Stain, MD       Future Appointments             In 2 months [place], Minette Brine, [place] [facility], [facility]

## 2020-09-13 ENCOUNTER — Ambulatory Visit: Payer: Medicare Other | Admitting: Physical Therapy

## 2020-09-14 ENCOUNTER — Ambulatory Visit: Payer: Medicare Other

## 2020-09-14 ENCOUNTER — Telehealth: Payer: Self-pay | Admitting: *Deleted

## 2020-09-14 NOTE — Telephone Encounter (Signed)
-----   Message from Melvenia Beam, MD sent at 09/12/2020  7:01 PM EDT ----- MRI of the brain showed atrophy and blood vessel damage concerning for vascular dementia but needs further workup. Please remember we ordered other testing for you, the formal memory testing and the PET Scan. thanks

## 2020-09-14 NOTE — Telephone Encounter (Signed)
I called the pt's son and LVM asking for call back.

## 2020-09-15 NOTE — Telephone Encounter (Signed)
I spoke with the pt's son and discussed the results of the pt's MRI brain which show findings concerning for vascular dementia. However he is aware this is not a final diagnosis as there is still other testing that has been ordered. His questions were answered. The pt has a pending pet scan on 09/21/20 and patient is scheduled with Dr Sima Matas on 12/07/20.

## 2020-09-20 ENCOUNTER — Other Ambulatory Visit: Payer: Self-pay

## 2020-09-20 ENCOUNTER — Ambulatory Visit: Payer: Medicare Other

## 2020-09-20 DIAGNOSIS — R2681 Unsteadiness on feet: Secondary | ICD-10-CM

## 2020-09-20 DIAGNOSIS — Z9181 History of falling: Secondary | ICD-10-CM

## 2020-09-20 DIAGNOSIS — M6281 Muscle weakness (generalized): Secondary | ICD-10-CM

## 2020-09-20 DIAGNOSIS — R2689 Other abnormalities of gait and mobility: Secondary | ICD-10-CM

## 2020-09-20 NOTE — Therapy (Signed)
[facility] [address] [place], [place], [address] Phone: [phone]   Fax:  [phone]  Physical Therapy Treatment/Discharge Summary  Patient Details  Name: Judy [name] MRN: [id number] Date of Birth: 16-Aug-1941 Referring Provider (PT): [name], MD  PHYSICAL THERAPY DISCHARGE SUMMARY  Visits from Start of Care: 9  Current functional level related to goals / functional outcomes: See Clinical Impression Statement   Remaining deficits: Low Fall Risk, Decreased Endurance   Education / Equipment: Educated on [employer] and Walking Program  Plan: Patient agrees to discharge.  Patient goals were met. Patient is being discharged due to meeting the stated rehab goals.  ?????       Encounter Date: 09/20/2020   PT End of Session - 09/20/20 1019    Visit Number 9    Number of Visits 9    Date for PT Re-Evaluation 10/20/20   written for 60 day POC   Authorization Type [id number] $30 copay (Collect 1 per disc, per day)    PT Start Time 1016    PT Stop Time 1051    PT Time Calculation (min) 35 min    Equipment Utilized During Treatment Gait belt    Activity Tolerance Patient tolerated treatment well    Behavior During Therapy WFL for tasks assessed/performed           Past Medical History:  Diagnosis Date   Anxiety    Back pain    Complication of anesthesia 2003   gallbladder surgery-resp. arrest-stop operation and pt. went to ICU - patient stated she has back surgery since then and had no trouble at all   Depression    Diabetes [facility])    Dyspnea    Gout    Heart attack [facility])    March 2021   History of kidney stones    Hypertension    Leg edema    Pancreatitis, chronic ([place]) 03/2016   Pneumonia due to COVID-19 virus 12/03/2019   Sleep apnea    Stroke [facility])    March 2021    Past Surgical History:  Procedure Laterality Date   BACK SURGERY     BREAST LUMPECTOMY WITH RADIOACTIVE SEED  LOCALIZATION Right 10/23/2019   Procedure: RIGHT BREAST LUMPECTOMY WITH RADIOACTIVE SEED LOCALIZATION;  Surgeon: Jovita Kussmaul, MD;  Location: [place];  Service: General;  Laterality: Right;   CARDIAC SURGERY     CHOLECYSTECTOMY     COLONOSCOPY  2013   CORONARY ARTERY BYPASS GRAFT N/A 03/03/2020   Procedure: CORONARY ARTERY BYPASS GRAFTING (CABG), ON PUMP, TIMES FIVE, USING LEFT INTERNAL MAMMARY ARTERY AND ENDOSCOPICALLY HARVESTED BILATERAL GREATER SAPHENOUS VEINS -flow track only;  Surgeon: Lajuana Matte, MD;  Location: [place];  Service: Open Heart Surgery;  Laterality: N/A;   LEFT HEART CATH AND CORONARY ANGIOGRAPHY N/A 03/01/2020   Procedure: LEFT HEART CATH AND CORONARY ANGIOGRAPHY;  Surgeon: Belva Crome, MD;  Location: [place] CV LAB;  Service: Cardiovascular;  Laterality: N/A;   TEE WITHOUT CARDIOVERSION N/A 03/03/2020   Procedure: TRANSESOPHAGEAL ECHOCARDIOGRAM (TEE);  Surgeon: Lajuana Matte, MD;  Location: [place];  Service: Open Heart Surgery;  Laterality: N/A;    There were no vitals filed for this visit.         [place] Adult PT Treatment/Exercise - 09/20/20 0001      Ambulation/Gait   Ambulation/Gait Yes    Ambulation/Gait Assistance 5: Supervision    Ambulation/Gait Assistance Details completed ambulation outdoors on unlevel surfaces x  500 ft with SPC with quad tip attachment. Patient demo no instances of instability with ambulation    Ambulation Distance (Feet) 500 Feet    Assistive device Straight cane    Gait Pattern Step-through pattern;Decreased stance time - right    Ambulation Surface Level;Indoor;Unlevel;Outdoor;Paved;Grass    Curb 5: Supervision    Curb Details (indicate cue type and reason) completed ascending/descending curb outdoors x 2 reps with SPC with quad tip attachment. patient able to demo properly with minimal cueing.       Standardized Balance Assessment   Standardized Balance Assessment Berg Balance Test;Timed Up and Go Test       Berg Balance Test   Sit to Stand Able to stand without using hands and stabilize independently    Standing Unsupported Able to stand safely 2 minutes    Sitting with Back Unsupported but Feet Supported on Floor or Stool Able to sit safely and securely 2 minutes    Stand to Sit Sits safely with minimal use of hands    Transfers Able to transfer safely, minor use of hands    Standing Unsupported with Eyes Closed Able to stand 10 seconds safely    Standing Ubsupported with Feet Together Able to place feet together independently and stand 1 minute safely    From Standing, Reach Forward with Outstretched Arm Can reach forward >12 cm safely (5")    From Standing Position, Pick up Object from Floor Able to pick up shoe safely and easily    From Standing Position, Turn to Look Behind Over each Shoulder Looks behind one side only/other side shows less weight shift    Turn 360 Degrees Able to turn 360 degrees safely in 4 seconds or less    Standing Unsupported, Alternately Place Feet on Step/Stool Able to stand independently and safely and complete 8 steps in 20 seconds    Standing Unsupported, One Foot in Front Able to plae foot ahead of the other independently and hold 30 seconds    Standing on One Leg Able to lift leg independently and hold 5-10 seconds    Total Score 52      Timed Up and Go Test   TUG Normal TUG    Normal TUG (seconds) 9.06   w/ SPC, 7.04 w/o AD     Self-Care   Self-Care Other Self-Care Comments    Other Self-Care Comments  Educated patient on continued completion of HEP and walking daily to maintain progress achieved with PT services.                 PT Education - 09/20/20 1056    Education Details progress toward LTG's, continue HEP/walking    Person(s) Educated Patient;Caregiver(s)    Methods Explanation    Comprehension Verbalized understanding            PT Short Term Goals - 08/16/20 1109      PT SHORT TERM GOAL #1   Title Pt will be independent with  initial HEP in order to build upon functional gains made in therapy. ALL STGS DUE 08/19/20    Baseline pt reports independent with HEP at home    Time 4    Period Weeks    Status Achieved    Target Date 08/19/20      PT SHORT TERM GOAL #2   Title Pt will undergo further assessment of BERG/DGI and LTG written as appropriate.    Baseline 39/56 on 8/23    Time 4  Period Weeks    Status Achieved      PT SHORT TERM GOAL #3   Title Pt will improve gait speed with LRAD to at least 2.6 ft/sec in order to demo improved community mobility.    Baseline 2.03 ft/sec, 12.13 seconds = 2.70 ft/sec    Time 4    Period Weeks    Status Achieved             PT Long Term Goals - 09/20/20 1025      PT LONG TERM GOAL #1   Title Pt will be independent with final HEP in order to build upon functional gains made in therapy. ALL LTGS DUE 09/16/20    Baseline reports independence with HEP, completing daily    Time 8    Period Weeks    Status Achieved      PT LONG TERM GOAL #2   Title Pt will perform a curb with LRAD with supervision in order to demo improved community mobility.    Baseline able to perform curb with supervision with [facility]    Time 8    Period Weeks    Status Achieved      PT LONG TERM GOAL #3   Title Pt will decr TUG time to 13 seconds or less with no AD vs. LRAD in order to demo decr fall risk.    Baseline 7.09 secs w/o AD, 9/06 secs w/ SPC    Time 8    Period Weeks    Status Achieved      PT LONG TERM GOAL #4   Title Patient will improve Berg Balance to >/= 45/56 to demo improved balance and reduced risk for falls    Baseline 39/56, 52/56    Time 8    Period Weeks    Status Achieved      PT LONG TERM GOAL #5   Title Pt will ambulate at least 300' over outdoor unlevel surfaces with LRAD in order to demo improved community mobility.    Baseline 450' w/ SPC on outdoor surfaces    Time 8    Period Weeks    Status Achieved                 Plan - 09/20/20 1059      Clinical Impression Statement Today's skilled PT session included assesment of patient's progress toward all LTGs. Patient has demonstrated progress with PT services and able to meet all LTGs during today's session, demonstrating improved ambulation with AD and reduced fall risk. Patient demonstrated reduced fall risk with Berg Balance 53/56, and TUG of 9.06 secs w/ SPC. PT educating on readiness for discharge at this time, and patient/caregiver verbalizing agreement.    Personal Factors and Comorbidities Age;Past/Current Experience;Comorbidity 3+;Time since onset of injury/illness/exacerbation    Comorbidities PMH: CVA, diabetes, HTN, hx of COVID, depression, back pain, CABG (03/2020)    Examination-Activity Limitations Squat;Stairs;Locomotion Level;Bathing;Transfers    Examination-Participation Restrictions Community Activity;Driving;Yard Work    Stability/Clinical Decision Making Stable/Uncomplicated    Rehab Potential Good    PT Frequency 1x / week   due to co pay   PT Duration 8 weeks    PT Treatment/Interventions ADLs/Self Care Home Management;Therapeutic activities;Patient/family education;Cryotherapy;Functional mobility training;Neuromuscular re-education;Moist Heat;Therapeutic exercise;Balance training    Consulted and Agree with Plan of Care Patient   pt's friend          Patient will benefit from skilled therapeutic intervention in order to improve the following deficits and impairments:  Cardiopulmonary  status limiting activity, Decreased mobility, Decreased activity tolerance, Decreased strength, Difficulty walking, Decreased balance, Abnormal gait, Decreased coordination  Visit Diagnosis: Unsteadiness on feet  Muscle weakness (generalized)  Other abnormalities of gait and mobility  History of falling     Problem List Patient Active Problem List   Diagnosis Date Noted   Memory loss 08/22/2020   Dementia with behavioral disturbance ([place]) 08/22/2020   Coronary  artery disease 05/17/2020   Pleural effusion 04/12/2020   Atrial fibrillation ([place]) 03/11/2020   S/P CABG (coronary artery bypass graft) 03/03/2020   Cerebral thrombosis with cerebral infarction 03/02/2020   Hyperlipidemia associated with type 2 diabetes mellitus ([place]) 02/09/2020   Type 2 diabetes mellitus, controlled ([place]) 01/12/2020   Class 2 severe obesity with serious comorbidity and body mass index (BMI) of 35.0 to 35.9 in adult, unspecified obesity type ([place]) 12/25/2018   Obstructive sleep apnea 11/04/2017   Lumbar stenosis with neurogenic claudication 11/19/2016   HTN (hypertension) 03/21/2016   Anxiety    Depression     Jones Bales, PT, DPT 09/20/2020, 11:00 AM  [facility] [address] [place], [place], [address] Phone: [phone]   Fax:  [phone]  Name: JAILENE CUPIT MRN: [id number] Date of Birth: [birth year]

## 2020-09-21 ENCOUNTER — Ambulatory Visit (HOSPITAL_COMMUNITY)
Admission: RE | Admit: 2020-09-21 | Discharge: 2020-09-21 | Disposition: A | Discharge status: Home / Self Care | Payer: Medicare Other | Source: Ambulatory Visit | Attending: Neurology | Admitting: Neurology

## 2020-09-21 ENCOUNTER — Ambulatory Visit: Payer: Medicare Other

## 2020-09-21 DIAGNOSIS — R4689 Other symptoms and signs involving appearance and behavior: Secondary | ICD-10-CM | POA: Insufficient documentation

## 2020-09-21 DIAGNOSIS — R269 Unspecified abnormalities of gait and mobility: Secondary | ICD-10-CM

## 2020-09-21 DIAGNOSIS — F0391 Unspecified dementia with behavioral disturbance: Secondary | ICD-10-CM

## 2020-09-21 DIAGNOSIS — R4189 Other symptoms and signs involving cognitive functions and awareness: Secondary | ICD-10-CM | POA: Insufficient documentation

## 2020-09-21 DIAGNOSIS — R413 Other amnesia: Secondary | ICD-10-CM | POA: Diagnosis present

## 2020-09-21 DIAGNOSIS — G309 Alzheimer's disease, unspecified: Secondary | ICD-10-CM | POA: Diagnosis present

## 2020-09-21 LAB — GLUCOSE, CAPILLARY: Glucose-Capillary: 142 mg/dL — ABNORMAL HIGH (ref 70–99)

## 2020-09-21 MED ORDER — FLUDEOXYGLUCOSE F - 18 (FDG) INJECTION
9.9900 | Freq: Once | INTRAVENOUS | Status: AC
  Administered 2020-09-21: 9.99 via INTRAVENOUS

## 2020-09-23 ENCOUNTER — Other Ambulatory Visit: Payer: Self-pay | Admitting: *Deleted

## 2020-09-23 DIAGNOSIS — I214 Non-ST elevation (NSTEMI) myocardial infarction: Secondary | ICD-10-CM

## 2020-09-23 LAB — IFOBT (OCCULT BLOOD): IFOBT: NEGATIVE

## 2020-09-24 ENCOUNTER — Other Ambulatory Visit: Payer: Self-pay | Admitting: Critical Care Medicine

## 2020-09-27 ENCOUNTER — Ambulatory Visit: Payer: Medicare Other

## 2020-09-28 ENCOUNTER — Ambulatory Visit: Payer: Medicare Other

## 2020-09-29 ENCOUNTER — Other Ambulatory Visit: Payer: Medicare Other

## 2020-09-29 ENCOUNTER — Ambulatory Visit: Payer: Medicare Other | Admitting: Physician Assistant

## 2020-09-29 ENCOUNTER — Encounter: Payer: Self-pay | Admitting: Physician Assistant

## 2020-09-29 VITALS — BP 142/78 | HR 72 | Ht 60.0 in | Wt 173.0 lb

## 2020-09-29 DIAGNOSIS — R197 Diarrhea, unspecified: Secondary | ICD-10-CM

## 2020-09-29 NOTE — Patient Instructions (Signed)
Your provider has requested that you go to the basement level for lab work before leaving today. Press "B" on the elevator. The lab is located at the first door on the left as you exit the elevator.  We will request your stool studies from Dr Jacalyn Lefevre  Start a fiber supplement daily ex. Metamucil,citacel or benefiber  Start Align probiotic daily  If you are age 78 or older, your body mass index should be between 23-30. Your Body mass index is 33.79 kg/m. If this is out of the aforementioned range listed, please consider follow up with your Primary Care Provider.  If you are age 23 or younger, your body mass index should be between 19-25. Your Body mass index is 33.79 kg/m. If this is out of the aformentioned range listed, please consider follow up with your Primary Care Provider.    Due to recent changes in healthcare laws, you may see the results of your imaging and laboratory studies on MyChart before your provider has had a chance to review them.  We understand that in some cases there may be results that are confusing or concerning to you. Not all laboratory results come back in the same time frame and the provider may be waiting for multiple results in order to interpret others.  Please give [name] 48 hours in order for your provider to thoroughly review all the results before contacting the office for clarification of your results.   I appreciate the  opportunity to care for you  Thank You   Lovett Calender

## 2020-09-29 NOTE — Progress Notes (Addendum)
Chief Complaint: Diarrhea  HPI:    Judy [name] is a 79 year old Caucasian female with a past medical history as listed below including diabetes and MI status post stenting on Eliquis, who was referred to me by Michael Boston, MD for a complaint of diarrhea.      Patient tells me she previously followed with [facility] for colonoscopies, her last 4 to 5 years ago.  She tells me it was normal with no polyps.    Today, patient tells me that for the past year she had had an increase in bowel movements about 4-5 times a day and these were just "straight water".  Tells me that her PCP did some stool studies which were negative (we do not have results) and took her off of her Metformin about a month and a half ago and over the past week or so the patient has had a decrease in bowel habits to only about 2 a day and they are starting to be "slightly more formed".  She tells me that are still very urgent and sometimes preceded by "bleh" nauseous feeling which goes away after the bowel movement.  She also describes a lot of excess gas in her system and her stomach "just rolls", sometimes this is so loud that the "dog gets up to go look out the window".  Patient tells me she is not really worried about this diarrhea other than that it is sometimes urgent, but overall she is just here because her doctors were worried about it.    Patient is also status post cholecystectomy years ago.    Denies fever, chills or blood in her stool.  Past Medical History:  Diagnosis Date   Anxiety    Back pain    Complication of anesthesia 2003   gallbladder surgery-resp. arrest-stop operation and pt. went to ICU - patient stated she has back surgery since then and had no trouble at all   Depression    Diabetes [facility])    Dyspnea    Gout    Heart attack [facility])    March 2021   History of kidney stones    Hypertension    Leg edema    Pancreatitis, chronic ([place]) 03/2016   Pneumonia due to COVID-19 virus 12/03/2019     Sleep apnea    Stroke [facility])    March 2021    Past Surgical History:  Procedure Laterality Date   BACK SURGERY     BREAST LUMPECTOMY WITH RADIOACTIVE SEED LOCALIZATION Right 10/23/2019   Procedure: RIGHT BREAST LUMPECTOMY WITH RADIOACTIVE SEED LOCALIZATION;  Surgeon: Jovita Kussmaul, MD;  Location: [place];  Service: General;  Laterality: Right;   CARDIAC SURGERY     CHOLECYSTECTOMY     COLONOSCOPY  2013   CORONARY ARTERY BYPASS GRAFT N/A 03/03/2020   Procedure: CORONARY ARTERY BYPASS GRAFTING (CABG), ON PUMP, TIMES FIVE, USING LEFT INTERNAL MAMMARY ARTERY AND ENDOSCOPICALLY HARVESTED BILATERAL GREATER SAPHENOUS VEINS -flow track only;  Surgeon: Lajuana Matte, MD;  Location: [place];  Service: Open Heart Surgery;  Laterality: N/A;   LEFT HEART CATH AND CORONARY ANGIOGRAPHY N/A 03/01/2020   Procedure: LEFT HEART CATH AND CORONARY ANGIOGRAPHY;  Surgeon: Belva Crome, MD;  Location: [place] CV LAB;  Service: Cardiovascular;  Laterality: N/A;   TEE WITHOUT CARDIOVERSION N/A 03/03/2020   Procedure: TRANSESOPHAGEAL ECHOCARDIOGRAM (TEE);  Surgeon: Lajuana Matte, MD;  Location: [place];  Service: Open Heart Surgery;  Laterality: N/A;    Current Outpatient  Medications  Medication Sig Dispense Refill   atorvastatin (LIPITOR) 20 MG tablet Take 1 tablet (20 mg total) by mouth at bedtime. 90 tablet 2   clopidogrel (PLAVIX) 75 MG tablet Take 1 tablet (75 mg total) by mouth daily. 90 tablet 3   DULoxetine (CYMBALTA) 60 MG capsule Take 60 mg by mouth daily.     ELIQUIS 5 MG TABS tablet TAKE 1 TABLET(5 MG) BY MOUTH TWICE DAILY (Patient taking differently: Take 5 mg by mouth 2 (two) times daily. ) 180 tablet 1   glucose blood (ACCU-CHEK AVIVA) test strip Test twice daily 100 each 2   JARDIANCE 10 MG TABS tablet TAKE 1 TABLET BY MOUTH DAILY 30 tablet 4   Lancets (ACCU-CHEK SAFE-T PRO) lancets Test twice daily 100 each 11   losartan (COZAAR) 25 MG tablet Take 1 tablet (25 mg  total) by mouth daily. 30 tablet 3   methocarbamol (ROBAXIN) 500 MG tablet Take 500 mg by mouth every 8 (eight) hours as needed for muscle spasms.      metoprolol tartrate (LOPRESSOR) 25 MG tablet TAKE 1/2 TABLET(12.5 MG) BY MOUTH TWICE DAILY 90 tablet 3   NON FORMULARY 1 each by Other route See admin instructions. CPAP machine nightly     nystatin cream (MYCOSTATIN) nystatin 100,000 unit/gram topical cream     traMADol (ULTRAM) 50 MG tablet Take 1 tablet (50 mg total) by mouth every 12 (twelve) hours as needed. (Patient taking differently: Take 50-100 mg by mouth every 8 (eight) hours as needed for moderate pain. ) 30 tablet 0   traZODone (DESYREL) 100 MG tablet Take 100 mg by mouth at bedtime.      furosemide (LASIX) 20 MG tablet Take 1 tablet (20 mg total) by mouth daily. 90 tablet 3   No current facility-administered medications for this visit.    Allergies as of 09/29/2020   (No Known Allergies)    Family History  Problem Relation Age of Onset   Dementia Mother    AAA (abdominal aortic aneurysm) Father    Breast cancer Maternal Aunt    Bladder Cancer Brother    Colon cancer Neg Hx    Stomach cancer Neg Hx    Esophageal cancer Neg Hx    Pancreatic cancer Neg Hx     Social History   Socioeconomic History   Marital status: Divorced    Spouse name: Not on file   Number of children: 1   Years of education: Not on file   Highest education level: Some college, no degree  Occupational History   Occupation: CNA  Tobacco Use   Smoking status: Never Smoker   Smokeless tobacco: Never Used  Scientific laboratory technician Use: Never used  Substance and Sexual Activity   Alcohol use: No    Alcohol/week: 0.0 standard drinks   Drug use: No   Sexual activity: Not Currently  Other Topics Concern   Not on file  Social History Narrative   Lives with Roommate   Drinks 32 ounces of caffeine daily   Right Handed   Social Determinants of Health   Financial  Resource Strain:    Difficulty of Paying Living Expenses: Not on file  Food Insecurity:    Worried About Charity fundraiser in the Last Year: Not on file   [employer] of Food in the Last Year: Not on file  Transportation Needs:    Lack of Transportation (Medical): Not on file   Lack of Transportation (Non-Medical): Not on  file  Physical Activity:    Days of Exercise per Week: Not on file   Minutes of Exercise per Session: Not on file  Stress:    Feeling of Stress : Not on file  Social Connections:    Frequency of Communication with Friends and Family: Not on file   Frequency of Social Gatherings with Friends and Family: Not on file   Attends Religious Services: Not on file   Active Member of Clubs or Organizations: Not on file   Attends Archivist Meetings: Not on file   Marital Status: Not on file  Intimate Partner Violence:    Fear of Current or Ex-Partner: Not on file   Emotionally Abused: Not on file   Physically Abused: Not on file   Sexually Abused: Not on file    Review of Systems:    Constitutional: No weight loss, fever or chills Skin: No rash  Cardiovascular: No chest pain Respiratory: No SOB Gastrointestinal: See HPI and otherwise negative Genitourinary: No dysuria  Neurological: No headache, dizziness or syncope Musculoskeletal: No new muscle or joint pain Hematologic: No bleeding  Psychiatric: No history of depression or anxiety   Physical Exam:  Vital signs: BP (!) 142/78    Pulse 72    Ht 5' (1.524 m)    Wt 173 lb (78.5 kg)    BMI 33.79 kg/m   Constitutional:   Pleasant Elderly Caucasian female appears to be in NAD, Well developed, Well nourished, alert and cooperative Head:  Normocephalic and atraumatic. Eyes:   PEERL, EOMI. No icterus. Conjunctiva pink. Ears:  Normal auditory acuity. Neck:  Supple Throat: Oral cavity and pharynx without inflammation, swelling or lesion.  Respiratory: Respirations even and unlabored. Lungs  clear to auscultation bilaterally.   No wheezes, crackles, or rhonchi.  Cardiovascular: Normal S1, S2. No MRG. Regular rate and rhythm. No peripheral edema, cyanosis or pallor.  Gastrointestinal:  Soft, nondistended, nontender. No rebound or guarding. Normal bowel sounds. No appreciable masses or hepatomegaly. Rectal:  Not performed.  Msk:  Symmetrical without gross deformities. Without edema, no deformity or joint abnormality.Uses walker to ambulate  Neurologic:  Alert and  oriented x4;  grossly normal neurologically.  Skin:   Dry and intact without significant lesions or rashes. Psychiatric: Demonstrates good judgement and reason without abnormal affect or behaviors.  RELEVANT LABS AND IMAGING: CBC    Component Value Date/Time   WBC 9.4 06/20/2020 1618   RBC 4.66 06/20/2020 1618   HGB 12.8 06/20/2020 1618   HGB 12.4 04/12/2020 1029   HCT 40.9 06/20/2020 1618   HCT 38.8 04/12/2020 1029   PLT 260 06/20/2020 1618   PLT 317 04/12/2020 1029   MCV 87.8 06/20/2020 1618   MCV 89 04/12/2020 1029   MCH 27.5 06/20/2020 1618   MCHC 31.3 06/20/2020 1618   RDW 15.0 06/20/2020 1618   RDW 13.2 04/12/2020 1029   LYMPHSABS 1.8 04/12/2020 1029   MONOABS 0.3 02/29/2020 1037   EOSABS 0.4 04/12/2020 1029   BASOSABS 0.0 04/12/2020 1029    CMP     Component Value Date/Time   NA 142 06/20/2020 1618   NA 141 04/12/2020 1029   K 3.6 06/20/2020 1618   CL 100 06/20/2020 1618   CO2 30 06/20/2020 1618   GLUCOSE 114 (H) 06/20/2020 1618   BUN 17 06/20/2020 1618   BUN 15 04/12/2020 1029   CREATININE 1.02 (H) 06/20/2020 1618   CALCIUM 8.8 (L) 06/20/2020 1618   PROT 6.6 06/20/2020 1618  PROT 6.8 04/12/2020 1029   ALBUMIN 3.8 06/20/2020 1618   ALBUMIN 4.2 04/12/2020 1029   AST 17 06/20/2020 1618   ALT 17 06/20/2020 1618   ALKPHOS 85 06/20/2020 1618   BILITOT 0.5 06/20/2020 1618   BILITOT <0.2 04/12/2020 1029   GFRNONAA 53 (L) 06/20/2020 1618   GFRAA >60 06/20/2020 1618    Assessment: 1.   Diarrhea: For the past year, some better since stopping Metformin a month and a half ago, status post cholecystectomy years ago; consider relationship to Metformin+/-IBS+/-infectious cause+/-bile dumping  Plan: 1.  We will request records from [facility] in regards to last colonoscopy. 2.  Ordered further stool studies to include GI pathogen panel. 3.  Recommend the patient start a fiber supplement such as Metamucil, Citrucel or Benefiber.  She should consume at least 25-35 g of fiber per day through her diet and a supplement. 4.  Recommend the patient start a daily probiotic such as Align once daily. 5.  Discussed excess gas in her system and provided her with an anti-gas producing handout 6.  Patient to follow in clinic with me in 4 to 6 weeks or sooner if necessary.  She was assigned to Dr. Havery Moros this morning.  Judy Newer, PA-C [facility] 09/29/2020, 11:19 AM  Cc: Michael Boston, MD   Addendum: 09/30/2020 1531  Records received regarding blood work from [facility]  09/23/2020 Hemosure negative  09/15/2020 CMP with a glucose elevated at 141 and otherwise normal, CBC normal, cholesterol panel normal, TSH normal, free T4 normal, vitamin D normal, A1c elevated at 6.6, vitamin B12 normal.  No change to plans.  Judy Newer, PA-C

## 2020-09-29 NOTE — Progress Notes (Signed)
Agree with assessment and plan as outlined.  Given symptoms in post cholecystectomy state could also try colestid 1gm BID if there is some chronicity to this.

## 2020-10-03 ENCOUNTER — Other Ambulatory Visit: Payer: Medicare Other

## 2020-10-03 DIAGNOSIS — R197 Diarrhea, unspecified: Secondary | ICD-10-CM

## 2020-10-06 LAB — GI PROFILE, STOOL, PCR

## 2020-10-14 ENCOUNTER — Other Ambulatory Visit: Payer: Self-pay | Admitting: Internal Medicine

## 2020-10-14 DIAGNOSIS — R5381 Other malaise: Secondary | ICD-10-CM

## 2020-10-18 ENCOUNTER — Encounter (HOSPITAL_COMMUNITY): Payer: Self-pay

## 2020-10-18 ENCOUNTER — Telehealth (HOSPITAL_COMMUNITY): Payer: Self-pay

## 2020-10-18 NOTE — Telephone Encounter (Signed)
Pt insurance is active and benefits verified through [facility] Medicare. Co-pay $0.00, DED $0.00/$0.00 met, out of pocket $3,600.00/$3,600.00 met, co-insurance 0%. No pre-authorization required. Passport, 10/18/20 @ 11:53AM, [id number]  Will contact patient to see if she is interested in the Cardiac Rehab Program.

## 2020-10-18 NOTE — Telephone Encounter (Signed)
Attempted to call patient in regards to Cardiac Rehab - LM on VM Mailed letter 

## 2020-10-31 ENCOUNTER — Other Ambulatory Visit: Payer: Self-pay | Admitting: Internal Medicine

## 2020-10-31 DIAGNOSIS — Z1231 Encounter for screening mammogram for malignant neoplasm of breast: Secondary | ICD-10-CM

## 2020-11-01 ENCOUNTER — Telehealth (HOSPITAL_COMMUNITY): Payer: Self-pay

## 2020-11-01 NOTE — Telephone Encounter (Signed)
No response from pt.  Closed referral  

## 2020-11-03 ENCOUNTER — Telehealth: Payer: Self-pay

## 2020-11-03 NOTE — Telephone Encounter (Signed)
Copied from [place] [id number]. Topic: General - Inquiry >> Nov 03, 2020  3:22 PM Gillis Ends D wrote: Reason for CRM: Patient called and asked if Dr. Ileana Roup nurse can give her a call back. She needs a letter that states when she had covid and when she ws released from Dr. Joya Gaskins. She can be reached at [phone]. Please advise

## 2020-11-10 ENCOUNTER — Ambulatory Visit: Payer: Medicare Other | Admitting: Physician Assistant

## 2020-11-10 ENCOUNTER — Telehealth (HOSPITAL_COMMUNITY): Payer: Self-pay | Admitting: Pharmacist

## 2020-11-11 NOTE — Telephone Encounter (Signed)
Attempt to call patient. UTR. Mailbox is full.

## 2020-11-11 NOTE — Telephone Encounter (Addendum)
She had covid 12/03/19  Look in chart review , my last encounter in may was when I last saw her, she now has a new pcp  pls send a letter stating that

## 2020-11-15 ENCOUNTER — Other Ambulatory Visit: Payer: Self-pay | Admitting: Pharmacist Clinician (PhC)/ Clinical Pharmacy Specialist

## 2020-11-15 ENCOUNTER — Ambulatory Visit: Payer: Medicare Other | Admitting: Physician Assistant

## 2020-11-15 MED ORDER — APIXABAN 5 MG PO TABS: ORAL_TABLET | ORAL | 1 refills | Status: DC

## 2020-11-15 NOTE — Telephone Encounter (Signed)
79 F, 78.5 kg SCr 1.02

## 2020-11-17 ENCOUNTER — Ambulatory Visit (HOSPITAL_COMMUNITY): Payer: Medicare Other

## 2020-11-18 ENCOUNTER — Encounter (HOSPITAL_COMMUNITY): Payer: Self-pay

## 2020-11-18 ENCOUNTER — Telehealth (HOSPITAL_COMMUNITY): Payer: Self-pay | Admitting: Pharmacy Technician

## 2020-11-18 ENCOUNTER — Telehealth (HOSPITAL_COMMUNITY): Payer: Self-pay

## 2020-11-18 NOTE — Telephone Encounter (Signed)
Spoke with [name] who requested I reach out to her friend Seymour Bars who helps her with her medications [phone]). Left VM on Adrien's phone to call me back.

## 2020-11-18 NOTE — Telephone Encounter (Signed)
Cardiac Rehab Medication Review by a Pharmacist  Does the patient  feel that his/her medications are working for him/her?  yes  Has the patient been experiencing any side effects to the medications prescribed?  no  Does the patient measure his/her own blood pressure or blood glucose at home?  yes  Blood glucose only - 130's in the morning (recently started metformin)  Does the patient have any problems obtaining medications due to transportation or finances?   no  Understanding of regimen: fair Understanding of indications: fair Potential of compliance: excellent  Spoke with friend Seymour Bars who confirmed all medications over the phone. [name] reports no issues with side effects or issues obtaining medications.    Pharmacist Intervention: N/A    Judy [name], PharmD PGY1 Acute Care Pharmacy Resident Phone: [phone] 11/18/2020 2:08 PM  Please check [email or web address] for unit specific pharmacy phone numbers.

## 2020-11-18 NOTE — Telephone Encounter (Signed)
Pt returned CR phone, she will come in for orientation 11/24/20 @ 9AM and will attend the 130PM class.  Mailed package

## 2020-11-18 NOTE — Telephone Encounter (Signed)
Attempted to call patient in regards to Cardiac Rehab - LM on VM Mailed letter 

## 2020-11-21 ENCOUNTER — Ambulatory Visit (HOSPITAL_COMMUNITY): Payer: Medicare Other

## 2020-11-24 ENCOUNTER — Inpatient Hospital Stay (HOSPITAL_COMMUNITY): Admission: RE | Admit: 2020-11-24 | Payer: Medicare Other | Source: Ambulatory Visit

## 2020-11-24 ENCOUNTER — Telehealth (HOSPITAL_COMMUNITY): Payer: Self-pay | Admitting: *Deleted

## 2020-11-24 ENCOUNTER — Encounter (HOSPITAL_COMMUNITY): Payer: Self-pay

## 2020-11-24 NOTE — Telephone Encounter (Signed)
Spoke with Juliann Pulse thinks that Wilsie may have forgotten about her appointment this morning.Barnet Pall, RN,BSN 11/24/2020 9:13 AM

## 2020-11-28 ENCOUNTER — Ambulatory Visit (HOSPITAL_COMMUNITY): Payer: Medicare Other

## 2020-11-28 ENCOUNTER — Other Ambulatory Visit: Payer: Self-pay | Admitting: Internal Medicine

## 2020-11-28 DIAGNOSIS — Z13820 Encounter for screening for osteoporosis: Secondary | ICD-10-CM

## 2020-12-05 ENCOUNTER — Other Ambulatory Visit: Payer: Self-pay | Admitting: Cardiovascular Disease

## 2020-12-05 ENCOUNTER — Ambulatory Visit (HOSPITAL_COMMUNITY): Payer: Medicare Other

## 2020-12-05 ENCOUNTER — Ambulatory Visit: Payer: Medicare Other | Admitting: Physician Assistant

## 2020-12-05 NOTE — Telephone Encounter (Signed)
Prescription refill request for Eliquis received. Indication:atrial fibrillation Last office visit: 9/21 oneal Scr:1.0  7/21 Age: 80 Weight:78.5 kg  Prescription refilled

## 2020-12-07 ENCOUNTER — Ambulatory Visit: Payer: Medicare Other | Admitting: Psychology

## 2020-12-08 ENCOUNTER — Telehealth (HOSPITAL_COMMUNITY): Payer: Self-pay

## 2020-12-08 NOTE — Telephone Encounter (Signed)
Pt called and stated she would like to reschedule CR. Adv pt this would be her 3rd time scheduling and she would need to follow thru with the next appt made. Patient verbalized understanding.   Patient will come in for orientation on 01/24/21 @ 1:15PM and will attend the 1:15PM exercise class.  Pensions consultant.

## 2020-12-08 NOTE — Telephone Encounter (Signed)
Pt insurance is active and benefits verified through [facility] Medicare. Co-pay $0.00, DED $0.00/$0.00 met, out of pocket $3,600.00/$0.00 met, co-insurance 0%. No pre-authorization required. Passport, 12/08/20 @ 2:48PM, [id number]

## 2020-12-09 ENCOUNTER — Ambulatory Visit (HOSPITAL_COMMUNITY): Payer: Medicare Other

## 2020-12-12 ENCOUNTER — Ambulatory Visit (HOSPITAL_COMMUNITY): Payer: Medicare Other

## 2020-12-13 ENCOUNTER — Ambulatory Visit
Admission: RE | Admit: 2020-12-13 | Discharge: 2020-12-13 | Disposition: A | Discharge status: Home / Self Care | Payer: Medicare Other | Source: Ambulatory Visit | Attending: Internal Medicine | Admitting: Internal Medicine

## 2020-12-13 ENCOUNTER — Other Ambulatory Visit: Payer: Self-pay

## 2020-12-13 ENCOUNTER — Telehealth: Payer: Self-pay | Admitting: Critical Care Medicine

## 2020-12-13 DIAGNOSIS — Z1231 Encounter for screening mammogram for malignant neoplasm of breast: Secondary | ICD-10-CM

## 2020-12-13 NOTE — Telephone Encounter (Signed)
Copied from CRM #[id number]. Topic: General - Inquiry >> Nov 03, 2020  3:22 PM Roach, Bonita D wrote: Reason for CRM: Patient called and asked if Dr. Wrights nurse can give her a call back. She needs a letter that states when she had covid and when she ws released from Dr. Wright. She can be reached at [phone]. Please advise 

## 2020-12-14 ENCOUNTER — Telehealth (INDEPENDENT_AMBULATORY_CARE_PROVIDER_SITE_OTHER): Payer: Self-pay

## 2020-12-14 NOTE — Telephone Encounter (Signed)
Copied from [place] [id number]. Topic: General - Inquiry >> Nov 03, 2020  3:22 PM Gillis Ends D wrote: Reason for CRM: Patient called and asked if Dr. Ileana Roup nurse can give her a call back. She needs a letter that states when she had covid and when she ws released from Dr. Joya Gaskins. She can be reached at [phone]. Please advise >> Dec 13, 2020  2:03 PM Leward Quan A wrote: Patients friends and helper Juliann Pulse along with the patient called in to inquire of Dr Joya Gaskins when they can get the information that they have been requesting since December. Patient have a hearing coming up and this information is vital. Needing her information on when she was released from having Covid. Please call ASAP to  Ph# [phone] or  [phone]  Please advise

## 2020-12-16 ENCOUNTER — Ambulatory Visit (HOSPITAL_COMMUNITY): Payer: Medicare Other

## 2020-12-16 NOTE — Telephone Encounter (Signed)
Addressed request in another encounter.

## 2020-12-17 NOTE — Telephone Encounter (Signed)
Please produce a letter that simply states the patient was fully rehab'd and released from Covid. In May 2021.   I will sign  She now has a private PCP who should be helping her with such matters.

## 2020-12-17 NOTE — Telephone Encounter (Signed)
I responded to this request previously.   There is a letter in her chart to reprint

## 2020-12-19 ENCOUNTER — Ambulatory Visit (HOSPITAL_COMMUNITY): Payer: Medicare Other

## 2020-12-19 ENCOUNTER — Encounter: Payer: Self-pay | Admitting: *Deleted

## 2020-12-19 NOTE — Telephone Encounter (Signed)
LVM- letter may be available in MyChart but could wait until Dr. Joya Gaskins signs to pick up.

## 2020-12-21 NOTE — Telephone Encounter (Signed)
Patient informed that letter is at the front desk, ready for pick up at her convenience. She stated she was not able to pick up letter at this time due to icy road conditions and she ambulates with a walker. Informed patient that the letter could be mailed. Patient was agreeable to that. Also left a copy at the front desk in accordion file for Dr. Bettina Gavia patients.

## 2020-12-23 ENCOUNTER — Ambulatory Visit (HOSPITAL_COMMUNITY): Payer: Medicare Other

## 2020-12-26 ENCOUNTER — Ambulatory Visit (HOSPITAL_COMMUNITY): Payer: Medicare Other

## 2020-12-30 ENCOUNTER — Ambulatory Visit (HOSPITAL_COMMUNITY): Payer: Medicare Other

## 2021-01-02 ENCOUNTER — Ambulatory Visit (HOSPITAL_COMMUNITY): Payer: Medicare Other

## 2021-01-06 ENCOUNTER — Ambulatory Visit (HOSPITAL_COMMUNITY): Payer: Medicare Other

## 2021-01-20 ENCOUNTER — Encounter (HOSPITAL_COMMUNITY)
Admission: RE | Admit: 2021-01-20 | Discharge: 2021-01-20 | Disposition: A | Discharge status: Home / Self Care | Payer: Medicare Other | Source: Ambulatory Visit | Attending: Cardiovascular Disease | Admitting: Cardiovascular Disease

## 2021-01-20 ENCOUNTER — Telehealth (HOSPITAL_COMMUNITY): Payer: Self-pay | Admitting: *Deleted

## 2021-01-20 ENCOUNTER — Other Ambulatory Visit: Payer: Self-pay

## 2021-01-20 DIAGNOSIS — Z951 Presence of aortocoronary bypass graft: Secondary | ICD-10-CM | POA: Insufficient documentation

## 2021-01-20 DIAGNOSIS — I214 Non-ST elevation (NSTEMI) myocardial infarction: Secondary | ICD-10-CM | POA: Insufficient documentation

## 2021-01-20 NOTE — Telephone Encounter (Signed)
Confirmed orientation appointment for cardiac rehab. Completed health history. Barnet Pall, RN,BSN 01/20/2021 11:07 AM

## 2021-01-22 ENCOUNTER — Telehealth (HOSPITAL_COMMUNITY): Payer: Self-pay | Admitting: Pharmacist

## 2021-01-22 NOTE — Telephone Encounter (Signed)
Opened in error

## 2021-01-24 ENCOUNTER — Encounter (HOSPITAL_COMMUNITY): Payer: Self-pay

## 2021-01-24 ENCOUNTER — Encounter (HOSPITAL_COMMUNITY)
Admission: RE | Admit: 2021-01-24 | Discharge: 2021-01-24 | Disposition: A | Discharge status: Home / Self Care | Payer: Medicare Other | Source: Ambulatory Visit | Attending: Cardiovascular Disease | Admitting: Cardiovascular Disease

## 2021-01-24 ENCOUNTER — Other Ambulatory Visit: Payer: Self-pay

## 2021-01-24 VITALS — BP 114/78 | HR 85 | Ht 59.75 in | Wt 180.1 lb

## 2021-01-24 DIAGNOSIS — I214 Non-ST elevation (NSTEMI) myocardial infarction: Secondary | ICD-10-CM

## 2021-01-24 DIAGNOSIS — Z951 Presence of aortocoronary bypass graft: Secondary | ICD-10-CM | POA: Diagnosis present

## 2021-01-24 HISTORY — DX: Hyperlipidemia, unspecified: E78.5

## 2021-01-24 HISTORY — DX: Atherosclerotic heart disease of native coronary artery without angina pectoris: I25.10

## 2021-01-24 LAB — GLUCOSE, CAPILLARY: Glucose-Capillary: 179 mg/dL — ABNORMAL HIGH (ref 70–99)

## 2021-01-24 NOTE — Progress Notes (Signed)
Cardiac Rehab Medication Review by a Nurse  Does the patient  feel that his/her medications are working for him/her?  yes  Has the patient been experiencing any side effects to the medications prescribed?  no  Does the patient measure his/her own blood pressure or blood glucose at home?  yes   Does the patient have any problems obtaining medications due to transportation or finances?   no  Understanding of regimen: good Understanding of indications: good Potential of compliance: good    Nurse comments: Yaeko is taking her medications as prescribed and has a good understanding of what her medications are for. Brylei checks her CBG's at home. Tilla does not have a BP monitor.   Harrell Gave RN BSN 01/24/2021 3:59 PM

## 2021-01-24 NOTE — Progress Notes (Signed)
Cardiac Individual Treatment Plan  Patient Details  Name: Judy [name] MRN: [id number] Date of Birth: 08-06-1941 Referring Provider:   Flowsheet Row CARDIAC REHAB PHASE II ORIENTATION from 01/24/2021 in [place]  Referring Provider O'Neal, Cassie Freer, MD      Initial Encounter Date:  [facility] PHASE II ORIENTATION from 01/24/2021 in [place]  Date 01/24/21      Visit Diagnosis: NSTEMI (non-ST elevated myocardial infarction) ([place]) 02/29/20  S/P CABG x 5  03/03/20  Patient's Home Medications on Admission:  Current Outpatient Medications:  .  atorvastatin (LIPITOR) 20 MG tablet, Take 1 tablet (20 mg total) by mouth at bedtime., Disp: 90 tablet, Rfl: 2 .  clopidogrel (PLAVIX) 75 MG tablet, Take 1 tablet (75 mg total) by mouth daily., Disp: 90 tablet, Rfl: 3 .  Cyanocobalamin (B-12 COMPLIANCE INJECTION) 1000 MCG/ML KIT, Inject 1,000 mcg as directed every 30 (thirty) days., Disp: , Rfl:  .  DULoxetine (CYMBALTA) 60 MG capsule, Take 60 mg by mouth daily., Disp: , Rfl:  .  ELIQUIS 5 MG TABS tablet, TAKE 1 TABLET(5 MG) BY MOUTH TWICE DAILY, Disp: 180 tablet, Rfl: 1 .  glucose blood (ACCU-CHEK AVIVA) test strip, Test twice daily, Disp: 100 each, Rfl: 2 .  JARDIANCE 10 MG TABS tablet, TAKE 1 TABLET BY MOUTH DAILY, Disp: 30 tablet, Rfl: 4 .  Lancets (ACCU-CHEK SAFE-T PRO) lancets, Test twice daily, Disp: 100 each, Rfl: 11 .  metFORMIN (GLUCOPHAGE-XR) 750 MG 24 hr tablet, Take 750 mg by mouth daily with breakfast., Disp: , Rfl:  .  NON FORMULARY, 1 each by Other route See admin instructions. CPAP machine nightly, Disp: , Rfl:  .  traZODone (DESYREL) 100 MG tablet, Take 150 mg by mouth at bedtime., Disp: , Rfl:  .  furosemide (LASIX) 20 MG tablet, Take 1 tablet (20 mg total) by mouth daily., Disp: 90 tablet, Rfl: 3 .  losartan (COZAAR) 25 MG tablet, Take 1 tablet (25 mg total) by mouth daily., Disp: 30 tablet,  Rfl: 3 .  methocarbamol (ROBAXIN) 500 MG tablet, Take 500 mg by mouth every 8 (eight) hours as needed for muscle spasms.  (Patient not taking: Reported on 01/24/2021), Disp: , Rfl:  .  metoprolol tartrate (LOPRESSOR) 25 MG tablet, TAKE 1/2 TABLET(12.5 MG) BY MOUTH TWICE DAILY (Patient not taking: Reported on 01/24/2021), Disp: 90 tablet, Rfl: 3  Past Medical History: Past Medical History:  Diagnosis Date  . Anxiety   . Back pain   . Complication of anesthesia 2003   gallbladder surgery-resp. arrest-stop operation and pt. went to ICU - patient stated she has back surgery since then and had no trouble at all  . Coronary artery disease   . Depression   . Diabetes ([place])   . Dyspnea   . Gout   . Heart attack [facility])    March 2021  . History of kidney stones   . Hyperlipidemia   . Hypertension   . Leg edema   . Pancreatitis, chronic ([place]) 03/2016  . Pneumonia due to COVID-19 virus 12/03/2019  . Sleep apnea   . Stroke [facility])    March 2021    Tobacco Use: Social History   Tobacco Use  Smoking Status Never Smoker  Smokeless Tobacco Never Used    Labs: Recent Review Scientist, physiological    Labs for ITP Cardiac and Pulmonary Rehab Latest Ref Rng & Units 03/03/2020 03/03/2020 03/03/2020 03/03/2020 05/17/2020   Cholestrol  0 - 200 mg/dL - - - - -   LDLCALC 0 - 99 mg/dL - - - - -   HDL >40 mg/dL - - - - -   Trlycerides <150 mg/dL - - - - -   Hemoglobin A1c 4.0 - 5.6 % - - - - 6.2(A)   PHART 7.350 - 7.450 - 7.344(L) 7.320(L) 7.305(L) -   PCO2ART 32.0 - 48.0 mmHg - 40.9 37.0 38.9 -   HCO3 20.0 - 28.0 mmol/L - 22.4 19.2(L) 19.4(L) -   TCO2 22 - 32 mmol/L 30 24 20(L) 21(L) -   ACIDBASEDEF 0.0 - 2.0 mmol/L - 3.0(H) 6.0(H) 6.0(H) -   O2SAT % - 89.0 96.0 93.0 -      Capillary Blood Glucose: Lab Results  Component Value Date   GLUCAP 179 (H) 01/24/2021   GLUCAP 142 (H) 09/21/2020   GLUCAP 132 (H) 05/09/2020   GLUCAP 135 (H) 04/29/2020   GLUCAP 173 (H) 04/29/2020     Exercise Target  Goals: Exercise Program Goal: Individual exercise prescription set using results from initial 6 min walk test and THRR while considering  patient's activity barriers and safety.   Exercise Prescription Goal: Starting with aerobic activity 30 plus minutes a day, 3 days per week for initial exercise prescription. Provide home exercise prescription and guidelines that participant acknowledges understanding prior to discharge.  Activity Barriers & Risk Stratification:  Activity Barriers & Cardiac Risk Stratification - 01/24/21 1323      Activity Barriers & Cardiac Risk Stratification   Activity Barriers Assistive Device;Other (comment);Balance Concerns;History of Falls;Arthritis    Comments Arthritis: both hands, left shoulder; left knee cap sometimes feels like it's moving, painful.    Cardiac Risk Stratification High           6 Minute Walk:  6 Minute Walk    Row Name 01/24/21 1337         6 Minute Walk   Phase Initial     Distance 1006 feet     Walk Time 6 minutes     # of Rest Breaks 0     MPH 1.9     METS 1.81     RPE 12     Perceived Dyspnea  1     VO2 Peak 6.34     Symptoms Yes (comment)     Comments Patient c/o mild SOB, fatigue.     Resting HR 85 bpm     Resting BP 114/78     Resting Oxygen Saturation  95 %     Exercise Oxygen Saturation  during 6 min walk 96 %     Max Ex. HR 112 bpm     Max Ex. BP 162/62     2 Minute Post BP 124/52            Oxygen Initial Assessment:   Oxygen Re-Evaluation:   Oxygen Discharge (Final Oxygen Re-Evaluation):   Initial Exercise Prescription:  Initial Exercise Prescription - 01/24/21 1400      Date of Initial Exercise RX and Referring Provider   Date 01/24/21    Referring Provider Geralynn Rile, MD    Expected Discharge Date 02/24/21      T5 Nustep   Level 1    SPM 85    Minutes 15    METs 1.8      Prescription Details   Frequency (times per week) 3    Duration Progress to 30 minutes of continuous  aerobic without signs/symptoms of physical  distress      Intensity   THRR 40-80% of Max Heartrate 57-114    Ratings of Perceived Exertion 11-13    Perceived Dyspnea 0-4      Progression   Progression Continue to progress workloads to maintain intensity without signs/symptoms of physical distress.      Resistance Training   Training Prescription Yes    Weight 2 lbs    Reps 10-15           Perform Capillary Blood Glucose checks as needed.  Exercise Prescription Changes:   Exercise Comments:   Exercise Goals and Review:  Exercise Goals    Row Name 01/24/21 1328             Exercise Goals   Increase Physical Activity Yes       Intervention Provide advice, education, support and counseling about physical activity/exercise needs.;Develop an individualized exercise prescription for aerobic and resistive training based on initial evaluation findings, risk stratification, comorbidities and participant's personal goals.       Expected Outcomes Short Term: Attend rehab on a regular basis to increase amount of physical activity.;Long Term: Exercising regularly at least 3-5 days a week.;Long Term: Add in home exercise to make exercise part of routine and to increase amount of physical activity.       Increase Strength and Stamina Yes       Intervention Provide advice, education, support and counseling about physical activity/exercise needs.;Develop an individualized exercise prescription for aerobic and resistive training based on initial evaluation findings, risk stratification, comorbidities and participant's personal goals.       Expected Outcomes Short Term: Increase workloads from initial exercise prescription for resistance, speed, and METs.;Short Term: Perform resistance training exercises routinely during rehab and add in resistance training at home;Long Term: Improve cardiorespiratory fitness, muscular endurance and strength as measured by increased METs and functional capacity  (6MWT)       Able to understand and use rate of perceived exertion (RPE) scale Yes       Intervention Provide education and explanation on how to use RPE scale       Expected Outcomes Short Term: Able to use RPE daily in rehab to express subjective intensity level;Long Term:  Able to use RPE to guide intensity level when exercising independently       Knowledge and understanding of Target Heart Rate Range (THRR) Yes       Intervention Provide education and explanation of THRR including how the numbers were predicted and where they are located for reference       Expected Outcomes Short Term: Able to state/look up THRR;Long Term: Able to use THRR to govern intensity when exercising independently;Short Term: Able to use daily as guideline for intensity in rehab       Able to check pulse independently Yes       Intervention Provide education and demonstration on how to check pulse in carotid and radial arteries.;Review the importance of being able to check your own pulse for safety during independent exercise       Expected Outcomes Short Term: Able to explain why pulse checking is important during independent exercise;Long Term: Able to check pulse independently and accurately       Understanding of Exercise Prescription Yes       Intervention Provide education, explanation, and written materials on patient's individual exercise prescription       Expected Outcomes Short Term: Able to explain program exercise prescription;Long Term: Able to explain home  exercise prescription to exercise independently              Exercise Goals Re-Evaluation :    Discharge Exercise Prescription (Final Exercise Prescription Changes):   Nutrition:  Target Goals: Understanding of nutrition guidelines, daily intake of sodium <1532m, cholesterol <2033m calories 30% from fat and 7% or less from saturated fats, daily to have 5 or more servings of fruits and vegetables.  Biometrics:  Pre Biometrics - 01/24/21  1313      Pre Biometrics   Waist Circumference 39 inches    Hip Circumference 45.75 inches    Waist to Hip Ratio 0.85 %    Triceps Skinfold 28 mm    % Body Fat 46.2 %    Grip Strength 23 kg    Flexibility 0 in    Single Leg Stand 7.06 seconds            Nutrition Therapy Plan and Nutrition Goals:   Nutrition Assessments:  MEDIFICTS Score Key:  ?70 Need to make dietary changes   40-70 Heart Healthy Diet  ? 40 Therapeutic Level Cholesterol Diet   Picture Your Plate Scores:  <4<79nhealthy dietary pattern with much room for improvement.  41-50 Dietary pattern unlikely to meet recommendations for good health and room for improvement.  51-60 More healthful dietary pattern, with some room for improvement.   >60 Healthy dietary pattern, although there may be some specific behaviors that could be improved.    Nutrition Goals Re-Evaluation:   Nutrition Goals Discharge (Final Nutrition Goals Re-Evaluation):   Psychosocial: Target Goals: Acknowledge presence or absence of significant depression and/or stress, maximize coping skills, provide positive support system. Participant is able to verbalize types and ability to use techniques and skills needed for reducing stress and depression.  Initial Review & Psychosocial Screening:  Initial Psych Review & Screening - 01/24/21 1554      Initial Review   Current issues with History of Depression;Current Stress Concerns    Source of Stress Concerns Chronic Illness;Unable to participate in former interests or hobbies    Comments NaNevaenas a history of depression and is currently on an antidepressant      FaClallamYes   NaKendiives with a roomate. NaLaurisaas her roomate and friend kaJuliann Pulseor support     Barriers   Psychosocial barriers to participate in program The patient should benefit from training in stress management and relaxation.      Screening Interventions   Interventions To provide  support and resources with identified psychosocial needs;Provide feedback about the scores to participant    Expected Outcomes Short Term goal: Identification and review with participant of any Quality of Life or Depression concerns found by scoring the questionnaire.;Long Term Goal: Stressors or current issues are controlled or eliminated.;Long Term goal: The participant improves quality of Life and PHQ9 Scores as seen by post scores and/or verbalization of changes           Quality of Life Scores:  Quality of Life - 01/24/21 1420      Quality of Life   Select Quality of Life      Quality of Life Scores   Health/Function Pre 12.4 %    Socioeconomic Pre 20.86 %    Psych/Spiritual Pre 14.07 %    Family Pre 13.5 %    GLOBAL Pre 14.68 %          Scores of 19 and below usually indicate a poorer quality  of life in these areas.  A difference of  2-3 points is a clinically meaningful difference.  A difference of 2-3 points in the total score of the Quality of Life Index has been associated with significant improvement in overall quality of life, self-image, physical symptoms, and general health in studies assessing change in quality of life.  PHQ-9: Recent Review Flowsheet Data    Depression screen [facility] 2/9 05/17/2020 04/19/2020 04/04/2020 03/24/2020 03/18/2020   Decreased Interest 2 1 - 1 1   Down, Depressed, Hopeless 2 1 - 1 1   PHQ - 2 Score 4 2 - 2 2   Altered sleeping 3 1 - 1 1   Tired, decreased energy 2 1 - 1 1   Change in appetite 3 1 - 1 1   Feeling bad or failure about yourself  1 0 - 0 0   Trouble concentrating 0 0 - 0 0   Moving slowly or fidgety/restless 1 0 - 0 0   Suicidal thoughts 0 0 0 0 0   PHQ-9 Score 14 5 - 5 5   Difficult doing work/chores - Not difficult at all - Somewhat difficult Somewhat difficult     Interpretation of Total Score  Total Score Depression Severity:  1-4 = Minimal depression, 5-9 = Mild depression, 10-14 = Moderate depression, 15-19 = Moderately  severe depression, 20-27 = Severe depression   Psychosocial Evaluation and Intervention:   Psychosocial Re-Evaluation:   Psychosocial Discharge (Final Psychosocial Re-Evaluation):   Vocational Rehabilitation: Provide vocational rehab assistance to qualifying candidates.   Vocational Rehab Evaluation & Intervention:  Vocational Rehab - 01/24/21 1558      Initial Vocational Rehab Evaluation & Intervention   Assessment shows need for Vocational Rehabilitation No           Education: Education Goals: Education classes will be provided on a weekly basis, covering required topics. Participant will state understanding/return demonstration of topics presented.  Learning Barriers/Preferences:  Learning Barriers/Preferences - 01/24/21 1557      Learning Barriers/Preferences   Learning Barriers Sight;Hearing   Wears reading glasses has bilateral hearing aides   Learning Preferences Skilled Demonstration;Pictoral;Verbal Instruction;Video           Education Topics: Hypertension, Hypertension Reduction -Define heart disease and high blood pressure. Discus how high blood pressure affects the body and ways to reduce high blood pressure.   Exercise and Your Heart -Discuss why it is important to exercise, the FITT principles of exercise, normal and abnormal responses to exercise, and how to exercise safely.   Angina -Discuss definition of angina, causes of angina, treatment of angina, and how to decrease risk of having angina.   Cardiac Medications -Review what the following cardiac medications are used for, how they affect the body, and side effects that may occur when taking the medications.  Medications include Aspirin, Beta blockers, calcium channel blockers, ACE Inhibitors, angiotensin receptor blockers, diuretics, digoxin, and antihyperlipidemics.   Congestive Heart Failure -Discuss the definition of CHF, how to live with CHF, the signs and symptoms of CHF, and how keep  track of weight and sodium intake.   Heart Disease and Intimacy -Discus the effect sexual activity has on the heart, how changes occur during intimacy as we age, and safety during sexual activity.   Smoking Cessation / COPD -Discuss different methods to quit smoking, the health benefits of quitting smoking, and the definition of COPD.   Nutrition I: Fats -Discuss the types of cholesterol, what cholesterol does to the heart,  and how cholesterol levels can be controlled.   Nutrition II: Labels -Discuss the different components of food labels and how to read food label   Heart Parts/Heart Disease and PAD -Discuss the anatomy of the heart, the pathway of blood circulation through the heart, and these are affected by heart disease.   Stress I: Signs and Symptoms -Discuss the causes of stress, how stress may lead to anxiety and depression, and ways to limit stress.   Stress II: Relaxation -Discuss different types of relaxation techniques to limit stress.   Warning Signs of Stroke / TIA -Discuss definition of a stroke, what the signs and symptoms are of a stroke, and how to identify when someone is having stroke.   Knowledge Questionnaire Score:  Knowledge Questionnaire Score - 01/24/21 1421      Knowledge Questionnaire Score   Pre Score 20/24           Core Components/Risk Factors/Patient Goals at Admission:  Personal Goals and Risk Factors at Admission - 01/24/21 1421      Core Components/Risk Factors/Patient Goals on Admission    Weight Management Yes;Obesity    Intervention Weight Management/Obesity: Establish reasonable short term and long term weight goals.;Obesity: Provide education and appropriate resources to help participant work on and attain dietary goals.    Admit Weight 180 lb 1.9 oz (81.7 kg)    Expected Outcomes Short Term: Continue to assess and modify interventions until short term weight is achieved;Long Term: Adherence to nutrition and physical  activity/exercise program aimed toward attainment of established weight goal;Weight Loss: Understanding of general recommendations for a balanced deficit meal plan, which promotes 1-2 lb weight loss per week and includes a negative energy balance of [phone] kcal/d;Weight Gain: Understanding of general recommendations for a high calorie, high protein meal plan that promotes weight gain by distributing calorie intake throughout the day with the consumption for 4-5 meals, snacks, and/or supplements;Understanding of distribution of calorie intake throughout the day with the consumption of 4-5 meals/snacks;Understanding recommendations for meals to include 15-35% energy as protein, 25-35% energy from fat, 35-60% energy from carbohydrates, less than 287m of dietary cholesterol, 20-35 gm of total fiber daily    Diabetes Yes    Intervention Provide education about signs/symptoms and action to take for hypo/hyperglycemia.;Provide education about proper nutrition, including hydration, and aerobic/resistive exercise prescription along with prescribed medications to achieve blood glucose in normal ranges: Fasting glucose 65-99 mg/dL    Expected Outcomes Short Term: Participant verbalizes understanding of the signs/symptoms and immediate care of hyper/hypoglycemia, proper foot care and importance of medication, aerobic/resistive exercise and nutrition plan for blood glucose control.;Long Term: Attainment of HbA1C < 7%.    Hypertension Yes    Intervention Provide education on lifestyle modifcations including regular physical activity/exercise, weight management, moderate sodium restriction and increased consumption of fresh fruit, vegetables, and low fat dairy, alcohol moderation, and smoking cessation.;Monitor prescription use compliance.    Expected Outcomes Short Term: Continued assessment and intervention until BP is < 140/927mHG in hypertensive participants. < 130/8047mG in hypertensive participants with diabetes,  heart failure or chronic kidney disease.;Long Term: Maintenance of blood pressure at goal levels.    Lipids Yes    Intervention Provide education and support for participant on nutrition & aerobic/resistive exercise along with prescribed medications to achieve LDL <41m80mDL >40mg4m Expected Outcomes Short Term: Participant states understanding of desired cholesterol values and is compliant with medications prescribed. Participant is following exercise prescription and nutrition guidelines.;Long Term: Cholesterol  controlled with medications as prescribed, with individualized exercise RX and with personalized nutrition plan. Value goals: LDL < 68m, HDL > 40 mg.    Stress Yes    Intervention Offer individual and/or small group education and counseling on adjustment to heart disease, stress management and health-related lifestyle change. Teach and support self-help strategies.;Refer participants experiencing significant psychosocial distress to appropriate mental health specialists for further evaluation and treatment. When possible, include family members and significant others in education/counseling sessions.    Expected Outcomes Short Term: Participant demonstrates changes in health-related behavior, relaxation and other stress management skills, ability to obtain effective social support, and compliance with psychotropic medications if prescribed.;Long Term: Emotional wellbeing is indicated by absence of clinically significant psychosocial distress or social isolation.           Core Components/Risk Factors/Patient Goals Review:    Core Components/Risk Factors/Patient Goals at Discharge (Final Review):    ITP Comments:  ITP Comments    Row Name 01/24/21 1314           ITP Comments Medical Director- Dr. TFransico Him MD              Comments: NIzora Galaattended orientation on 01/24/2021 to review rules and guidelines for program.  Completed 6 minute walk test, Intitial ITP, and exercise  prescription.  VSS. Telemetry-Sinus Rhythm with arrythmia. NMariacelestereported having mild shortness of breath and fatigue towards the end of her walk test. NIzora Galaused a rolling walker for stabilty. Safety measures and social distancing in place per CDC guidelines.MBarnet Pall RN,BSN 01/24/2021 4:11 PM

## 2021-01-30 ENCOUNTER — Encounter (HOSPITAL_COMMUNITY)
Admission: RE | Admit: 2021-01-30 | Discharge: 2021-01-30 | Disposition: A | Discharge status: Home / Self Care | Payer: Medicare Other | Source: Ambulatory Visit | Attending: Cardiovascular Disease | Admitting: Cardiovascular Disease

## 2021-01-30 ENCOUNTER — Other Ambulatory Visit: Payer: Self-pay

## 2021-01-30 DIAGNOSIS — I214 Non-ST elevation (NSTEMI) myocardial infarction: Secondary | ICD-10-CM

## 2021-01-30 DIAGNOSIS — Z951 Presence of aortocoronary bypass graft: Secondary | ICD-10-CM

## 2021-01-30 LAB — GLUCOSE, CAPILLARY
Glucose-Capillary: 131 mg/dL — ABNORMAL HIGH (ref 70–99)
Glucose-Capillary: 107 mg/dL — ABNORMAL HIGH (ref 70–99)

## 2021-01-30 NOTE — Progress Notes (Signed)
Daily Session Note  Patient Details  Name: Judy [name] MRN: [id number] Date of Birth: 05/31/1941 Referring Provider:   Flowsheet Row CARDIAC REHAB PHASE II ORIENTATION from 01/24/2021 in [place]  Referring Provider O'Neal, Cassie Freer, MD      Encounter Date: 01/30/2021  Check In:  Session Check In - 01/30/21 1320      Check-In   Supervising physician immediately available to respond to emergencies [facility] immediately available    Physician(s) Dr. Venetia Constable    Location [facility]    Staff Present Seward Carol, MS, ACSM CEP, Exercise Physiologist;Sharyl Panchal, RN, BSN;Carlette Wilber Oliphant, RN, BSN;Ramon Dredge, RN, MHA;David Makemson, MS, EP-C, CCRP    Virtual Visit No    Medication changes reported     No    Fall or balance concerns reported    No    Tobacco Cessation No Change    Current number of cigarettes/nicotine per day     0    Warm-up and Cool-down Performed on first and last piece of equipment    Resistance Training Performed Yes    VAD Patient? No    PAD/SET Patient? No      Pain Assessment   Currently in Pain? No/denies    Pain Score 0-No pain    Multiple Pain Sites No           Capillary Blood Glucose: Results for orders placed or performed during the hospital encounter of 01/30/21 (from the past 24 hour(s))  Glucose, capillary     Status: Abnormal   Collection Time: 01/30/21  1:15 PM  Result Value Ref Range   Glucose-Capillary 131 (H) 70 - 99 mg/dL  Glucose, capillary     Status: Abnormal   Collection Time: 01/30/21  2:00 PM  Result Value Ref Range   Glucose-Capillary 107 (H) 70 - 99 mg/dL     Exercise Prescription Changes - 01/30/21 1445      Response to Exercise   Blood Pressure (Admit) 136/80    Blood Pressure (Exercise) 144/72    Blood Pressure (Exit) 140/72    Heart Rate (Admit) 78 bpm    Heart Rate (Exercise) 89 bpm    Heart Rate (Exit) 77 bpm    Rating of Perceived  Exertion (Exercise) 11    Symptoms None    Comments Pt's first day in the [facility] program.    Duration Progress to 30 minutes of  aerobic without signs/symptoms of physical distress    Intensity THRR unchanged      Progression   Progression Continue to progress workloads to maintain intensity without signs/symptoms of physical distress.    Average METs 1.9      Resistance Training   Training Prescription Yes    Weight 2 lbs    Reps 10-15    Time 10 Minutes      Interval Training   Interval Training No      T5 Nustep   Level 1    SPM 75    Minutes 25    METs 1.9           Social History   Tobacco Use  Smoking Status Never Smoker  Smokeless Tobacco Never Used    Goals Met:  Exercise tolerated well No report of cardiac concerns or symptoms Strength training completed today  Goals Unmet:  Not Applicable  Comments: Judy [name] started cardiac rehab today.  Pt tolerated light exercise without difficulty. VSS, telemetry-Sinus Rhythm, asymptomatic.  Medication list  reconciled. Pt denies barriers to medicaiton compliance.  PSYCHOSOCIAL ASSESSMENT:  PHQ-1. Pt exhibits positive coping skills, hopeful outlook has her room mate and friends for support. No psychosocial needs identified at this time, no psychosocial interventions necessary. Patient admits to being depressed and is currently on an antidepressant.   Pt enjoys sewing.   Pt oriented to exercise equipment and routine.    Understanding verbalized.Barnet Pall, RN,BSN 01/30/2021 5:20 PM   Dr. Fransico Him is Medical Director for Cardiac Rehab at [facility].

## 2021-01-31 NOTE — Progress Notes (Signed)
Cardiac Individual Treatment Plan  Patient Details  Name: Judy [name] MRN: [id number] Date of Birth: 1941-08-12 Referring Provider:   Flowsheet Row CARDIAC REHAB PHASE II ORIENTATION from 01/24/2021 in [place]  Referring Provider O'Neal, Cassie Freer, MD      Initial Encounter Date:  [place] PHASE II ORIENTATION from 01/24/2021 in [place]  Date 01/24/21      Visit Diagnosis: NSTEMI (non-ST elevated myocardial infarction) ([place]) 02/29/20  S/P CABG x 5  03/03/20  Patient's Home Medications on Admission:  Current Outpatient Medications:  .  atorvastatin (LIPITOR) 20 MG tablet, Take 1 tablet (20 mg total) by mouth at bedtime., Disp: 90 tablet, Rfl: 2 .  clopidogrel (PLAVIX) 75 MG tablet, Take 1 tablet (75 mg total) by mouth daily., Disp: 90 tablet, Rfl: 3 .  Cyanocobalamin (B-12 COMPLIANCE INJECTION) 1000 MCG/ML KIT, Inject 1,000 mcg as directed every 30 (thirty) days., Disp: , Rfl:  .  DULoxetine (CYMBALTA) 60 MG capsule, Take 60 mg by mouth daily., Disp: , Rfl:  .  ELIQUIS 5 MG TABS tablet, TAKE 1 TABLET(5 MG) BY MOUTH TWICE DAILY, Disp: 180 tablet, Rfl: 1 .  furosemide (LASIX) 20 MG tablet, Take 1 tablet (20 mg total) by mouth daily., Disp: 90 tablet, Rfl: 3 .  glucose blood (ACCU-CHEK AVIVA) test strip, Test twice daily, Disp: 100 each, Rfl: 2 .  JARDIANCE 10 MG TABS tablet, TAKE 1 TABLET BY MOUTH DAILY, Disp: 30 tablet, Rfl: 4 .  Lancets (ACCU-CHEK SAFE-T PRO) lancets, Test twice daily, Disp: 100 each, Rfl: 11 .  losartan (COZAAR) 25 MG tablet, Take 1 tablet (25 mg total) by mouth daily., Disp: 30 tablet, Rfl: 3 .  metFORMIN (GLUCOPHAGE-XR) 750 MG 24 hr tablet, Take 750 mg by mouth daily with breakfast., Disp: , Rfl:  .  methocarbamol (ROBAXIN) 500 MG tablet, Take 500 mg by mouth every 8 (eight) hours as needed for muscle spasms.  (Patient not taking: Reported on 01/24/2021), Disp: , Rfl:  .   metoprolol tartrate (LOPRESSOR) 25 MG tablet, TAKE 1/2 TABLET(12.5 MG) BY MOUTH TWICE DAILY (Patient not taking: Reported on 01/24/2021), Disp: 90 tablet, Rfl: 3 .  NON FORMULARY, 1 each by Other route See admin instructions. CPAP machine nightly, Disp: , Rfl:  .  traZODone (DESYREL) 100 MG tablet, Take 150 mg by mouth at bedtime., Disp: , Rfl:   Past Medical History: Past Medical History:  Diagnosis Date  . Anxiety   . Back pain   . Complication of anesthesia 2003   gallbladder surgery-resp. arrest-stop operation and pt. went to ICU - patient stated she has back surgery since then and had no trouble at all  . Coronary artery disease   . Depression   . Diabetes ([place])   . Dyspnea   . Gout   . Heart attack [facility])    March 2021  . History of kidney stones   . Hyperlipidemia   . Hypertension   . Leg edema   . Pancreatitis, chronic ([place]) 03/2016  . Pneumonia due to COVID-19 virus 12/03/2019  . Sleep apnea   . Stroke [facility])    March 2021    Tobacco Use: Social History   Tobacco Use  Smoking Status Never Smoker  Smokeless Tobacco Never Used    Labs: Recent Review Scientist, physiological    Labs for ITP Cardiac and Pulmonary Rehab Latest Ref Rng & Units 03/03/2020 03/03/2020 03/03/2020 03/03/2020 05/17/2020   Cholestrol  0 - 200 mg/dL - - - - -   LDLCALC 0 - 99 mg/dL - - - - -   HDL >40 mg/dL - - - - -   Trlycerides <150 mg/dL - - - - -   Hemoglobin A1c 4.0 - 5.6 % - - - - 6.2(A)   PHART 7.350 - 7.450 - 7.344(L) 7.320(L) 7.305(L) -   PCO2ART 32.0 - 48.0 mmHg - 40.9 37.0 38.9 -   HCO3 20.0 - 28.0 mmol/L - 22.4 19.2(L) 19.4(L) -   TCO2 22 - 32 mmol/L 30 24 20(L) 21(L) -   ACIDBASEDEF 0.0 - 2.0 mmol/L - 3.0(H) 6.0(H) 6.0(H) -   O2SAT % - 89.0 96.0 93.0 -      Capillary Blood Glucose: Lab Results  Component Value Date   GLUCAP 107 (H) 01/30/2021   GLUCAP 131 (H) 01/30/2021   GLUCAP 179 (H) 01/24/2021   GLUCAP 142 (H) 09/21/2020   GLUCAP 132 (H) 05/09/2020     Exercise Target  Goals: Exercise Program Goal: Individual exercise prescription set using results from initial 6 min walk test and THRR while considering  patient's activity barriers and safety.   Exercise Prescription Goal: Starting with aerobic activity 30 plus minutes a day, 3 days per week for initial exercise prescription. Provide home exercise prescription and guidelines that participant acknowledges understanding prior to discharge.  Activity Barriers & Risk Stratification:  Activity Barriers & Cardiac Risk Stratification - 01/24/21 1323      Activity Barriers & Cardiac Risk Stratification   Activity Barriers Assistive Device;Other (comment);Balance Concerns;History of Falls;Arthritis    Comments Arthritis: both hands, left shoulder; left knee cap sometimes feels like it's moving, painful.    Cardiac Risk Stratification High           6 Minute Walk:  6 Minute Walk    Row Name 01/24/21 1337         6 Minute Walk   Phase Initial     Distance 1006 feet     Walk Time 6 minutes     # of Rest Breaks 0     MPH 1.9     METS 1.81     RPE 12     Perceived Dyspnea  1     VO2 Peak 6.34     Symptoms Yes (comment)     Comments Patient c/o mild SOB, fatigue.     Resting HR 85 bpm     Resting BP 114/78     Resting Oxygen Saturation  95 %     Exercise Oxygen Saturation  during 6 min walk 96 %     Max Ex. HR 112 bpm     Max Ex. BP 162/62     2 Minute Post BP 124/52            Oxygen Initial Assessment:   Oxygen Re-Evaluation:   Oxygen Discharge (Final Oxygen Re-Evaluation):   Initial Exercise Prescription:  Initial Exercise Prescription - 01/24/21 1400      Date of Initial Exercise RX and Referring Provider   Date 01/24/21    Referring Provider Geralynn Rile, MD    Expected Discharge Date 02/24/21      T5 Nustep   Level 1    SPM 85    Minutes 15    METs 1.8      Prescription Details   Frequency (times per week) 3    Duration Progress to 30 minutes of continuous  aerobic without signs/symptoms of physical  distress      Intensity   THRR 40-80% of Max Heartrate 57-114    Ratings of Perceived Exertion 11-13    Perceived Dyspnea 0-4      Progression   Progression Continue to progress workloads to maintain intensity without signs/symptoms of physical distress.      Resistance Training   Training Prescription Yes    Weight 2 lbs    Reps 10-15           Perform Capillary Blood Glucose checks as needed.  Exercise Prescription Changes:  Exercise Prescription Changes    Row Name 01/30/21 1445             Response to Exercise   Blood Pressure (Admit) 136/80       Blood Pressure (Exercise) 144/72       Blood Pressure (Exit) 140/72       Heart Rate (Admit) 78 bpm       Heart Rate (Exercise) 89 bpm       Heart Rate (Exit) 77 bpm       Rating of Perceived Exertion (Exercise) 11       Symptoms None       Comments Pt's first day in the [facility] program.       Duration Progress to 30 minutes of  aerobic without signs/symptoms of physical distress       Intensity THRR unchanged               Progression   Progression Continue to progress workloads to maintain intensity without signs/symptoms of physical distress.       Average METs 1.9               Resistance Training   Training Prescription Yes       Weight 2 lbs       Reps 10-15       Time 10 Minutes               Interval Training   Interval Training No               T5 Nustep   Level 1       SPM 75       Minutes 25       METs 1.9              Exercise Comments:  Exercise Comments    Row Name 01/30/21 1448           Exercise Comments Pt's first day in the [place] program. Pt tolerated session well and is off to a good start.              Exercise Goals and Review:  Exercise Goals    Row Name 01/24/21 1328             Exercise Goals   Increase Physical Activity Yes       Intervention Provide advice, education, support and counseling about physical  activity/exercise needs.;Develop an individualized exercise prescription for aerobic and resistive training based on initial evaluation findings, risk stratification, comorbidities and participant's personal goals.       Expected Outcomes Short Term: Attend rehab on a regular basis to increase amount of physical activity.;Long Term: Exercising regularly at least 3-5 days a week.;Long Term: Add in home exercise to make exercise part of routine and to increase amount of physical activity.       Increase Strength and Stamina Yes       Intervention  Provide advice, education, support and counseling about physical activity/exercise needs.;Develop an individualized exercise prescription for aerobic and resistive training based on initial evaluation findings, risk stratification, comorbidities and participant's personal goals.       Expected Outcomes Short Term: Increase workloads from initial exercise prescription for resistance, speed, and METs.;Short Term: Perform resistance training exercises routinely during rehab and add in resistance training at home;Long Term: Improve cardiorespiratory fitness, muscular endurance and strength as measured by increased METs and functional capacity (6MWT)       Able to understand and use rate of perceived exertion (RPE) scale Yes       Intervention Provide education and explanation on how to use RPE scale       Expected Outcomes Short Term: Able to use RPE daily in rehab to express subjective intensity level;Long Term:  Able to use RPE to guide intensity level when exercising independently       Knowledge and understanding of Target Heart Rate Range (THRR) Yes       Intervention Provide education and explanation of THRR including how the numbers were predicted and where they are located for reference       Expected Outcomes Short Term: Able to state/look up THRR;Long Term: Able to use THRR to govern intensity when exercising independently;Short Term: Able to use daily as  guideline for intensity in rehab       Able to check pulse independently Yes       Intervention Provide education and demonstration on how to check pulse in carotid and radial arteries.;Review the importance of being able to check your own pulse for safety during independent exercise       Expected Outcomes Short Term: Able to explain why pulse checking is important during independent exercise;Long Term: Able to check pulse independently and accurately       Understanding of Exercise Prescription Yes       Intervention Provide education, explanation, and written materials on patient's individual exercise prescription       Expected Outcomes Short Term: Able to explain program exercise prescription;Long Term: Able to explain home exercise prescription to exercise independently              Exercise Goals Re-Evaluation :  Exercise Goals Re-Evaluation    Judy [name] Name 01/30/21 1447             Exercise Goal Re-Evaluation   Exercise Goals Review Increase Physical Activity;Increase Strength and Stamina;Able to understand and use rate of perceived exertion (RPE) scale;Knowledge and understanding of Target Heart Rate Range (THRR);Understanding of Exercise Prescription       Comments Pt's first day in the [facility] program. Pt understands the RPE scale, THRR, and Exercise Rx.       Expected Outcomes Will continue to monitor patient and progress exercise workloads as tolerated.               Discharge Exercise Prescription (Final Exercise Prescription Changes):  Exercise Prescription Changes - 01/30/21 1445      Response to Exercise   Blood Pressure (Admit) 136/80    Blood Pressure (Exercise) 144/72    Blood Pressure (Exit) 140/72    Heart Rate (Admit) 78 bpm    Heart Rate (Exercise) 89 bpm    Heart Rate (Exit) 77 bpm    Rating of Perceived Exertion (Exercise) 11    Symptoms None    Comments Pt's first day in the [facility] program.    Duration Progress to 30 minutes of  aerobic without  signs/symptoms of physical distress    Intensity THRR unchanged      Progression   Progression Continue to progress workloads to maintain intensity without signs/symptoms of physical distress.    Average METs 1.9      Resistance Training   Training Prescription Yes    Weight 2 lbs    Reps 10-15    Time 10 Minutes      Interval Training   Interval Training No      T5 Nustep   Level 1    SPM 75    Minutes 25    METs 1.9           Nutrition:  Target Goals: Understanding of nutrition guidelines, daily intake of sodium <1547m, cholesterol <2073m calories 30% from fat and 7% or less from saturated fats, daily to have 5 or more servings of fruits and vegetables.  Biometrics:  Pre Biometrics - 01/24/21 1313      Pre Biometrics   Waist Circumference 39 inches    Hip Circumference 45.75 inches    Waist to Hip Ratio 0.85 %    Triceps Skinfold 28 mm    % Body Fat 46.2 %    Grip Strength 23 kg    Flexibility 0 in    Single Leg Stand 7.06 seconds            Nutrition Therapy Plan and Nutrition Goals:   Nutrition Assessments:  MEDIFICTS Score Key:  ?70 Need to make dietary changes   40-70 Heart Healthy Diet  ? 40 Therapeutic Level Cholesterol Diet   Picture Your Plate Scores:  <4<13nhealthy dietary pattern with much room for improvement.  41-50 Dietary pattern unlikely to meet recommendations for good health and room for improvement.  51-60 More healthful dietary pattern, with some room for improvement.   >60 Healthy dietary pattern, although there may be some specific behaviors that could be improved.    Nutrition Goals Re-Evaluation:   Nutrition Goals Discharge (Final Nutrition Goals Re-Evaluation):   Psychosocial: Target Goals: Acknowledge presence or absence of significant depression and/or stress, maximize coping skills, provide positive support system. Participant is able to verbalize types and ability to use techniques and skills needed for  reducing stress and depression.  Initial Review & Psychosocial Screening:  Initial Psych Review & Screening - 01/24/21 1554      Initial Review   Current issues with History of Depression;Current Stress Concerns    Source of Stress Concerns Chronic Illness;Unable to participate in former interests or hobbies    Comments NaBrendias a history of depression and is currently on an antidepressant      Judy [name]   Judy [name] with a roomate. Judy [name] her roomate and friend Judy [name] support     Barriers   Psychosocial barriers to participate in program The patient should benefit from training in stress management and relaxation.      Screening Interventions   Interventions To provide support and resources with identified psychosocial needs;Provide feedback about the scores to participant    Expected Outcomes Short Term goal: Identification and review with participant of any Quality of Life or Depression concerns found by scoring the questionnaire.;Long Term Goal: Stressors or current issues are controlled or eliminated.;Long Term goal: The participant improves quality of Life and PHQ9 Scores as seen by post scores and/or verbalization of changes           Quality of Life Scores:  Quality of Life -  01/24/21 1420      Quality of Life   Select Quality of Life      Quality of Life Scores   Health/Function Pre 12.4 %    Socioeconomic Pre 20.86 %    Psych/Spiritual Pre 14.07 %    Family Pre 13.5 %    GLOBAL Pre 14.68 %          Scores of 19 and below usually indicate a poorer quality of life in these areas.  A difference of  2-3 points is a clinically meaningful difference.  A difference of 2-3 points in the total score of the Quality of Life Index has been associated with significant improvement in overall quality of life, self-image, physical symptoms, and general health in studies assessing change in quality of life.  PHQ-9: Recent Review Flowsheet  Data    Depression screen [facility] 2/9 01/30/2021 05/17/2020 04/19/2020 04/04/2020 03/24/2020   Decreased Interest '1 2 1 ' - 1   Down, Depressed, Hopeless 0 2 1 - 1   PHQ - 2 Score '1 4 2 ' - 2   Altered sleeping - 3 1 - 1   Tired, decreased energy - 2 1 - 1   Change in appetite - 3 1 - 1   Feeling bad or failure about yourself  - 1 0 - 0   Trouble concentrating - 0 0 - 0   Moving slowly or fidgety/restless - 1 0 - 0   Suicidal thoughts - 0 0 0 0   PHQ-9 Score - 14 5 - 5   Difficult doing work/chores - - Not difficult at all - Somewhat difficult     Interpretation of Total Score  Total Score Depression Severity:  1-4 = Minimal depression, 5-9 = Mild depression, 10-14 = Moderate depression, 15-19 = Moderately severe depression, 20-27 = Severe depression   Psychosocial Evaluation and Intervention:   Psychosocial Re-Evaluation:   Psychosocial Discharge (Final Psychosocial Re-Evaluation):   Vocational Rehabilitation: Provide vocational rehab assistance to qualifying candidates.   Vocational Rehab Evaluation & Intervention:  Vocational Rehab - 01/24/21 1558      Initial Vocational Rehab Evaluation & Intervention   Assessment shows need for Vocational Rehabilitation No           Education: Education Goals: Education classes will be provided on a weekly basis, covering required topics. Participant will state understanding/return demonstration of topics presented.  Learning Barriers/Preferences:  Learning Barriers/Preferences - 01/24/21 1557      Learning Barriers/Preferences   Learning Barriers Sight;Hearing   Wears reading glasses has bilateral hearing aides   Learning Preferences Skilled Demonstration;Pictoral;Verbal Instruction;Video           Education Topics: Hypertension, Hypertension Reduction -Define heart disease and high blood pressure. Discus how high blood pressure affects the body and ways to reduce high blood pressure.   Exercise and Your Heart -Discuss why it is  important to exercise, the FITT principles of exercise, normal and abnormal responses to exercise, and how to exercise safely.   Angina -Discuss definition of angina, causes of angina, treatment of angina, and how to decrease risk of having angina.   Cardiac Medications -Review what the following cardiac medications are used for, how they affect the body, and side effects that may occur when taking the medications.  Medications include Aspirin, Beta blockers, calcium channel blockers, ACE Inhibitors, angiotensin receptor blockers, diuretics, digoxin, and antihyperlipidemics.   Congestive Heart Failure -Discuss the definition of CHF, how to live with CHF, the signs and symptoms of  CHF, and how keep track of weight and sodium intake.   Heart Disease and Intimacy -Discus the effect sexual activity has on the heart, how changes occur during intimacy as we age, and safety during sexual activity.   Smoking Cessation / COPD -Discuss different methods to quit smoking, the health benefits of quitting smoking, and the definition of COPD.   Nutrition I: Fats -Discuss the types of cholesterol, what cholesterol does to the heart, and how cholesterol levels can be controlled.   Nutrition II: Labels -Discuss the different components of food labels and how to read food label   Heart Parts/Heart Disease and PAD -Discuss the anatomy of the heart, the pathway of blood circulation through the heart, and these are affected by heart disease.   Stress I: Signs and Symptoms -Discuss the causes of stress, how stress may lead to anxiety and depression, and ways to limit stress.   Stress II: Relaxation -Discuss different types of relaxation techniques to limit stress.   Warning Signs of Stroke / TIA -Discuss definition of a stroke, what the signs and symptoms are of a stroke, and how to identify when someone is having stroke.   Knowledge Questionnaire Score:  Knowledge Questionnaire Score -  01/24/21 1421      Knowledge Questionnaire Score   Pre Score 20/24           Core Components/Risk Factors/Patient Goals at Admission:  Personal Goals and Risk Factors at Admission - 01/24/21 1421      Core Components/Risk Factors/Patient Goals on Admission    Weight Management Yes;Obesity    Intervention Weight Management/Obesity: Establish reasonable short term and long term weight goals.;Obesity: Provide education and appropriate resources to help participant work on and attain dietary goals.    Admit Weight 180 lb 1.9 oz (81.7 kg)    Expected Outcomes Short Term: Continue to assess and modify interventions until short term weight is achieved;Long Term: Adherence to nutrition and physical activity/exercise program aimed toward attainment of established weight goal;Weight Loss: Understanding of general recommendations for a balanced deficit meal plan, which promotes 1-2 lb weight loss per week and includes a negative energy balance of [phone] kcal/d;Weight Gain: Understanding of general recommendations for a high calorie, high protein meal plan that promotes weight gain by distributing calorie intake throughout the day with the consumption for 4-5 meals, snacks, and/or supplements;Understanding of distribution of calorie intake throughout the day with the consumption of 4-5 meals/snacks;Understanding recommendations for meals to include 15-35% energy as protein, 25-35% energy from fat, 35-60% energy from carbohydrates, less than 242m of dietary cholesterol, 20-35 gm of total fiber daily    Diabetes Yes    Intervention Provide education about signs/symptoms and action to take for hypo/hyperglycemia.;Provide education about proper nutrition, including hydration, and aerobic/resistive exercise prescription along with prescribed medications to achieve blood glucose in normal ranges: Fasting glucose 65-99 mg/dL    Expected Outcomes Short Term: Participant verbalizes understanding of the  signs/symptoms and immediate care of hyper/hypoglycemia, proper foot care and importance of medication, aerobic/resistive exercise and nutrition plan for blood glucose control.;Long Term: Attainment of HbA1C < 7%.    Hypertension Yes    Intervention Provide education on lifestyle modifcations including regular physical activity/exercise, weight management, moderate sodium restriction and increased consumption of fresh fruit, vegetables, and low fat dairy, alcohol moderation, and smoking cessation.;Monitor prescription use compliance.    Expected Outcomes Short Term: Continued assessment and intervention until BP is < 140/984mHG in hypertensive participants. < 130/801mG in hypertensive  participants with diabetes, heart failure or chronic kidney disease.;Long Term: Maintenance of blood pressure at goal levels.    Lipids Yes    Intervention Provide education and support for participant on nutrition & aerobic/resistive exercise along with prescribed medications to achieve LDL <7m, HDL >465m    Expected Outcomes Short Term: Participant states understanding of desired cholesterol values and is compliant with medications prescribed. Participant is following exercise prescription and nutrition guidelines.;Long Term: Cholesterol controlled with medications as prescribed, with individualized exercise RX and with personalized nutrition plan. Value goals: LDL < 7079mHDL > 40 mg.    Stress Yes    Intervention Offer individual and/or small group education and counseling on adjustment to heart disease, stress management and health-related lifestyle change. Teach and support self-help strategies.;Refer participants experiencing significant psychosocial distress to appropriate mental health specialists for further evaluation and treatment. When possible, include family members and significant others in education/counseling sessions.    Expected Outcomes Short Term: Participant demonstrates changes in health-related  behavior, relaxation and other stress management skills, ability to obtain effective social support, and compliance with psychotropic medications if prescribed.;Long Term: Emotional wellbeing is indicated by absence of clinically significant psychosocial distress or social isolation.           Core Components/Risk Factors/Patient Goals Review:   Goals and Risk Factor Review    Row Name 01/31/21 1702             Core Components/Risk Factors/Patient Goals Review   Personal Goals Review Weight Management/Obesity;Hypertension;Diabetes;Lipids       Review NanBlazearted exercise on 01/30/21 and did well with exercise. Vital signs stable       Expected Outcomes NanAuryll continue to particpate in phase 2 cardiac rehab for exercise, nutrition and lifestyle modifications              Core Components/Risk Factors/Patient Goals at Discharge (Final Review):   Goals and Risk Factor Review - 01/31/21 1702      Core Components/Risk Factors/Patient Goals Review   Personal Goals Review Weight Management/Obesity;Hypertension;Diabetes;Lipids    Review Judy [name] exercise on 01/30/21 and did well with exercise. Vital signs stable    Expected Outcomes Judy [name] continue to particpate in phase 2 cardiac rehab for exercise, nutrition and lifestyle modifications           ITP Comments:  ITP Comments    Row Name 01/24/21 1314 01/31/21 1701         ITP Comments Medical Director- Dr. TraFransico HimD 30 Day ITP Review. Judy [name] exercise at cardiac rehab on 01/30/21 and did well with exercise             Comments: See ITP comments.MarBarnet PallN,BSN 01/31/2021 5:04 PM

## 2021-02-01 NOTE — Progress Notes (Signed)
QUALITY OF LIFE SCORE REVIEW  Judy [name] completed Quality of Life survey as a participant in Cardiac Rehab. Scores 21.0 or below are considered low. Pt score very low in several areas Overall 14.68, Health and Function 12.4, socioeconomic 20.86, physiological and spiritual 14.07, family 13.5. Patient quality of life slightly altered by physical constraints which limits ability to perform as prior to recent cardiac illness. [name] reports being depressed as she is currently on an antdepressant. [name] emotional support and reassurance.  Will continue to monitor and intervene as necessary. Will forward to Dr Jacalyn Lefevre.Barnet Pall, RN,BSN 02/01/2021 1:22 PM

## 2021-02-03 ENCOUNTER — Other Ambulatory Visit: Payer: Self-pay

## 2021-02-03 ENCOUNTER — Encounter (HOSPITAL_COMMUNITY)
Admission: RE | Admit: 2021-02-03 | Discharge: 2021-02-03 | Disposition: A | Discharge status: Home / Self Care | Payer: Medicare Other | Source: Ambulatory Visit | Attending: Cardiovascular Disease | Admitting: Cardiovascular Disease

## 2021-02-03 DIAGNOSIS — Z951 Presence of aortocoronary bypass graft: Secondary | ICD-10-CM | POA: Diagnosis not present

## 2021-02-03 DIAGNOSIS — I214 Non-ST elevation (NSTEMI) myocardial infarction: Secondary | ICD-10-CM | POA: Diagnosis present

## 2021-02-06 ENCOUNTER — Other Ambulatory Visit: Payer: Self-pay

## 2021-02-06 ENCOUNTER — Encounter (HOSPITAL_COMMUNITY)
Admission: RE | Admit: 2021-02-06 | Discharge: 2021-02-06 | Disposition: A | Discharge status: Home / Self Care | Payer: Medicare Other | Source: Ambulatory Visit | Attending: Cardiovascular Disease | Admitting: Cardiovascular Disease

## 2021-02-06 DIAGNOSIS — I214 Non-ST elevation (NSTEMI) myocardial infarction: Secondary | ICD-10-CM | POA: Diagnosis not present

## 2021-02-06 DIAGNOSIS — Z951 Presence of aortocoronary bypass graft: Secondary | ICD-10-CM

## 2021-02-06 LAB — GLUCOSE, CAPILLARY
Glucose-Capillary: 192 mg/dL — ABNORMAL HIGH (ref 70–99)
Glucose-Capillary: 138 mg/dL — ABNORMAL HIGH (ref 70–99)

## 2021-02-10 ENCOUNTER — Other Ambulatory Visit: Payer: Self-pay

## 2021-02-10 ENCOUNTER — Encounter (HOSPITAL_COMMUNITY)
Admission: RE | Admit: 2021-02-10 | Discharge: 2021-02-10 | Disposition: A | Discharge status: Home / Self Care | Payer: Medicare Other | Source: Ambulatory Visit | Attending: Cardiovascular Disease | Admitting: Cardiovascular Disease

## 2021-02-10 DIAGNOSIS — Z951 Presence of aortocoronary bypass graft: Secondary | ICD-10-CM

## 2021-02-10 DIAGNOSIS — I214 Non-ST elevation (NSTEMI) myocardial infarction: Secondary | ICD-10-CM | POA: Diagnosis not present

## 2021-02-10 NOTE — Progress Notes (Signed)
Judy [name] 80 y.o. female Nutrition Note  Diagnosis: STEMI CABGx5  Past Medical History:  Diagnosis Date  . Anxiety   . Back pain   . Complication of anesthesia 2003   gallbladder surgery-resp. arrest-stop operation and pt. went to ICU - patient stated she has back surgery since then and had no trouble at all  . Coronary artery disease   . Depression   . Diabetes ([place])   . Dyspnea   . Gout   . Heart attack [facility])    March 2021  . History of kidney stones   . Hyperlipidemia   . Hypertension   . Leg edema   . Pancreatitis, chronic ([place]) 03/2016  . Pneumonia due to COVID-19 virus 12/03/2019  . Sleep apnea   . Stroke [facility])    March 2021     Medications reviewed.   Current Outpatient Medications:  .  atorvastatin (LIPITOR) 20 MG tablet, Take 1 tablet (20 mg total) by mouth at bedtime., Disp: 90 tablet, Rfl: 2 .  clopidogrel (PLAVIX) 75 MG tablet, Take 1 tablet (75 mg total) by mouth daily., Disp: 90 tablet, Rfl: 3 .  Cyanocobalamin (B-12 COMPLIANCE INJECTION) 1000 MCG/ML KIT, Inject 1,000 mcg as directed every 30 (thirty) days., Disp: , Rfl:  .  DULoxetine (CYMBALTA) 60 MG capsule, Take 60 mg by mouth daily., Disp: , Rfl:  .  ELIQUIS 5 MG TABS tablet, TAKE 1 TABLET(5 MG) BY MOUTH TWICE DAILY, Disp: 180 tablet, Rfl: 1 .  furosemide (LASIX) 20 MG tablet, Take 1 tablet (20 mg total) by mouth daily., Disp: 90 tablet, Rfl: 3 .  glucose blood (ACCU-CHEK AVIVA) test strip, Test twice daily, Disp: 100 each, Rfl: 2 .  JARDIANCE 10 MG TABS tablet, TAKE 1 TABLET BY MOUTH DAILY, Disp: 30 tablet, Rfl: 4 .  Lancets (ACCU-CHEK SAFE-T PRO) lancets, Test twice daily, Disp: 100 each, Rfl: 11 .  losartan (COZAAR) 25 MG tablet, Take 1 tablet (25 mg total) by mouth daily., Disp: 30 tablet, Rfl: 3 .  metFORMIN (GLUCOPHAGE-XR) 750 MG 24 hr tablet, Take 750 mg by mouth daily with breakfast., Disp: , Rfl:  .  methocarbamol (ROBAXIN) 500 MG tablet, Take 500 mg by mouth every 8 (eight) hours as needed  for muscle spasms.  (Patient not taking: Reported on 01/24/2021), Disp: , Rfl:  .  metoprolol tartrate (LOPRESSOR) 25 MG tablet, TAKE 1/2 TABLET(12.5 MG) BY MOUTH TWICE DAILY (Patient not taking: Reported on 01/24/2021), Disp: 90 tablet, Rfl: 3 .  NON FORMULARY, 1 each by Other route See admin instructions. CPAP machine nightly, Disp: , Rfl:  .  traZODone (DESYREL) 100 MG tablet, Take 150 mg by mouth at bedtime., Disp: , Rfl:    Ht Readings from Last 1 Encounters:  01/24/21 4' 11.75" (1.518 m)     Wt Readings from Last 3 Encounters:  01/24/21 180 lb 1.9 oz (81.7 kg)  09/29/20 173 lb (78.5 kg)  08/22/20 169 lb (76.7 kg)     There is no height or weight on file to calculate BMI.   Social History   Tobacco Use  Smoking Status Never Smoker  Smokeless Tobacco Never Used     Lab Results  Component Value Date   CHOL 89 03/01/2020   Lab Results  Component Value Date   HDL 39 (L) 03/01/2020   Lab Results  Component Value Date   LDLCALC 30 03/01/2020   Lab Results  Component Value Date   TRIG 101 03/01/2020  Lab Results  Component Value Date   HGBA1C 6.2 (A) 05/17/2020     CBG (last 3)  No results for input(s): GLUCAP in the last 72 hours.   Nutrition Note  Spoke with pt. Nutrition Plan and Nutrition Survey goals reviewed with pt.  Pt wants to lose wt. Pt has been trying to lose wt by reducing portions, adding vegetables to her diet. She states MD recommended not going below 165 lbs.  Pt feels like changing snacking habit and exercising more will help reach her goals. She is thinking about joining the [facility].   Pt has Type 2 Diabetes. Last A1c indicates blood glucose well-controlled.   Per discussion, pt does use canned/convenience foods often. Pt does not add salt to food. Pt eats out about 3 times per week   Pt was taking metamucil but has quit since becoming constipated. She was taking it daily and not drinking many fluids. Diarrhea has returned.  She was  planning to start taking it again today. Recommend pt start with every other day and incorporate more water and monitor symptoms.   Pt expressed understanding of the information reviewed.   Nutrition Diagnosis ? Food-and nutrition-related knowledge deficit related to lack of exposure to information as related to diagnosis of: ? CVD ? Type 2 Diabetes  Nutrition Intervention ? Pt's individual nutrition plan reviewed with pt. ? Benefits of adopting Heart Healthy diet discussed when Picture Your Plate reviewed.   ? Continue client-centered nutrition education by RD, as part of interdisciplinary care.  Goal(s) ? Pt to identify food quantities necessary to achieve weight loss of 5-10 lb at graduation from cardiac rehab.  ? Pt to build a healthy plate including vegetables, fruits, whole grains, and low-fat dairy products in a heart healthy meal plan.  Plan:    Will provide client-centered nutrition education as part of interdisciplinary care  Monitor and evaluate progress toward nutrition goal with team.   Michaele Offer, MS, RDN, LDN

## 2021-02-13 ENCOUNTER — Other Ambulatory Visit: Payer: Self-pay

## 2021-02-13 ENCOUNTER — Encounter (HOSPITAL_COMMUNITY)
Admission: RE | Admit: 2021-02-13 | Discharge: 2021-02-13 | Disposition: A | Discharge status: Home / Self Care | Payer: Medicare Other | Source: Ambulatory Visit | Attending: Cardiovascular Disease | Admitting: Cardiovascular Disease

## 2021-02-13 DIAGNOSIS — I214 Non-ST elevation (NSTEMI) myocardial infarction: Secondary | ICD-10-CM

## 2021-02-13 DIAGNOSIS — Z951 Presence of aortocoronary bypass graft: Secondary | ICD-10-CM

## 2021-02-17 ENCOUNTER — Other Ambulatory Visit: Payer: Self-pay

## 2021-02-17 ENCOUNTER — Encounter (HOSPITAL_COMMUNITY)
Admission: RE | Admit: 2021-02-17 | Discharge: 2021-02-17 | Disposition: A | Discharge status: Home / Self Care | Payer: Medicare Other | Source: Ambulatory Visit | Attending: Cardiovascular Disease | Admitting: Cardiovascular Disease

## 2021-02-17 DIAGNOSIS — Z951 Presence of aortocoronary bypass graft: Secondary | ICD-10-CM

## 2021-02-17 DIAGNOSIS — I214 Non-ST elevation (NSTEMI) myocardial infarction: Secondary | ICD-10-CM

## 2021-02-20 ENCOUNTER — Encounter (HOSPITAL_COMMUNITY): Payer: Medicare Other

## 2021-02-22 ENCOUNTER — Telehealth (HOSPITAL_COMMUNITY): Payer: Self-pay | Admitting: Critical Care Medicine

## 2021-02-22 NOTE — Progress Notes (Unsigned)
Cardiology Office Note:   Date:  02/23/2021  NAME:  Judy [name]    MRN: [id number] DOB:  1941/06/07   PCP:  Michael Boston, MD  Cardiologist:  Evalina Field, MD   Referring MD: Elsie Stain, MD   Chief Complaint  Patient presents with  . Coronary Artery Disease   History of Present Illness:   Judy [name] is a 80 y.o. female with a hx of CAD status post CABG, diabetes, hypertension, stroke, memory impairment, atrial fibrillation who presents for follow-up.  She presents with her friend from church.  She seems to be doing well.  She will complete cardiac rehab next week.  Denies any significant chest pain.  Does get short of breath with exertion.  Weights are stable.  BP 142/72.  We need to switch her Plavix to aspirin.  She is on Eliquis no bleeding.  Jardiance was recently increased.  A1c now 7.5.  She is working on this.  Cholesterol is at goal.  Denies any rapid heartbeat sensation.  All other medications appear to be stable.  Most recent LDL cholesterol is 61 which is at goal.  Her BP is acceptable.  She has plans to join the [facility].  She does need to work on her diet a bit more.  Given that her diabetes A1c number is increased she does need to work on dieting.  We also recommended 150 minutes a week of physical activity.  Problem List 1. CAD/NSTEMI s/p CABG -02/2020 with NSTEMI and had 3v CAD -03/03/2020: CABG x 5; LIMA-LAD, SVG to OM2, OM3, D1, PLV (OM3 jump graft off D1) 2. HTN 3. DM -A1c 7.5 -T chol 127, HDL 49, LDL 61, triglycerides 86 4. CVA -post cath complication  5. OSA 6. Afib with RVR -developed after CABG 03/11/2020 -spontaneously converted back to NSR 7. Carotid Artery stenosis -1-39% bilatearlly  Past Medical History: Past Medical History:  Diagnosis Date  . Anxiety   . Back pain   . Complication of anesthesia 2003   gallbladder surgery-resp. arrest-stop operation and pt. went to ICU - patient stated she has back surgery since then and had no trouble at  all  . Coronary artery disease   . Depression   . Diabetes ([place])   . Dyspnea   . Gout   . Heart attack [facility])    March 2021  . History of kidney stones   . Hyperlipidemia   . Hypertension   . Leg edema   . Pancreatitis, chronic ([place]) 03/2016  . Pneumonia due to COVID-19 virus 12/03/2019  . Sleep apnea   . Stroke [facility])    March 2021    Past Surgical History: Past Surgical History:  Procedure Laterality Date  . BACK SURGERY    . BREAST LUMPECTOMY WITH RADIOACTIVE SEED LOCALIZATION Right 10/23/2019   Procedure: RIGHT BREAST LUMPECTOMY WITH RADIOACTIVE SEED LOCALIZATION;  Surgeon: Jovita Kussmaul, MD;  Location: [place];  Service: General;  Laterality: Right;  . CARDIAC CATHETERIZATION    . CARDIAC SURGERY    . CHOLECYSTECTOMY    . COLONOSCOPY  2013  . CORONARY ARTERY BYPASS GRAFT N/A 03/03/2020   Procedure: CORONARY ARTERY BYPASS GRAFTING (CABG), ON PUMP, TIMES FIVE, USING LEFT INTERNAL MAMMARY ARTERY AND ENDOSCOPICALLY HARVESTED BILATERAL GREATER SAPHENOUS VEINS -flow track only;  Surgeon: Lajuana Matte, MD;  Location: [place];  Service: Open Heart Surgery;  Laterality: N/A;  . LEFT HEART CATH AND CORONARY ANGIOGRAPHY N/A 03/01/2020   Procedure: LEFT HEART  CATH AND CORONARY ANGIOGRAPHY;  Surgeon: Belva Crome, MD;  Location: [place] CV LAB;  Service: Cardiovascular;  Laterality: N/A;  . TEE WITHOUT CARDIOVERSION N/A 03/03/2020   Procedure: TRANSESOPHAGEAL ECHOCARDIOGRAM (TEE);  Surgeon: Lajuana Matte, MD;  Location: [place];  Service: Open Heart Surgery;  Laterality: N/A;    Current Medications: Current Meds  Medication Sig  . aspirin EC 81 MG tablet Take 1 tablet (81 mg total) by mouth daily. Swallow whole.  [name] atorvastatin (LIPITOR) 20 MG tablet Take 1 tablet (20 mg total) by mouth at bedtime.  . cephALEXin (KEFLEX) 500 MG capsule Take 500 mg by mouth 2 (two) times daily.  . Cyanocobalamin (B-12 COMPLIANCE INJECTION) 1000 MCG/ML KIT Inject 1,000 mcg as directed every  30 (thirty) days.  . DULoxetine (CYMBALTA) 60 MG capsule Take 60 mg by mouth daily.  [name] ELIQUIS 5 MG TABS tablet TAKE 1 TABLET(5 MG) BY MOUTH TWICE DAILY  . empagliflozin (JARDIANCE) 25 MG TABS tablet   . furosemide (LASIX) 20 MG tablet Take 1 tablet (20 mg total) by mouth daily.  [name] glucose blood (ACCU-CHEK AVIVA) test strip Test twice daily  . Lancets (ACCU-CHEK SAFE-T PRO) lancets Test twice daily  . losartan (COZAAR) 25 MG tablet Take 1 tablet (25 mg total) by mouth daily.  . metFORMIN (GLUCOPHAGE-XR) 750 MG 24 hr tablet Take 750 mg by mouth daily with breakfast.  . methocarbamol (ROBAXIN) 500 MG tablet Take 500 mg by mouth every 8 (eight) hours as needed for muscle spasms.  . metoprolol tartrate (LOPRESSOR) 25 MG tablet TAKE 1/2 TABLET(12.5 MG) BY MOUTH TWICE DAILY  . NON FORMULARY 1 each by Other route See admin instructions. CPAP machine nightly  . traZODone (DESYREL) 100 MG tablet Take 150 mg by mouth at bedtime.  . [DISCONTINUED] clopidogrel (PLAVIX) 75 MG tablet Take 1 tablet (75 mg total) by mouth daily.     Allergies:    Patient has no known allergies.   Social History: Social History   Socioeconomic History  . Marital status: Divorced    Spouse name: Not on file  . Number of children: 1  . Years of education: 36  . Highest education level: Some college, no degree  Occupational History  . Occupation: CNA  Tobacco Use  . Smoking status: Never Smoker  . Smokeless tobacco: Never Used  Vaping Use  . Vaping Use: Never used  Substance and Sexual Activity  . Alcohol use: No    Alcohol/week: 0.0 standard drinks  . Drug use: No  . Sexual activity: Not Currently  Other Topics Concern  . Not on file  Social History Narrative   Lives with Roommate   Drinks 32 ounces of caffeine daily   Right Handed   Social Determinants of Health   Financial Resource Strain: Not on file  Food Insecurity: Not on file  Transportation Needs: Not on file  Physical Activity: Not on file   Stress: Not on file  Social Connections: Not on file     Family History: The patient's family history includes AAA (abdominal aortic aneurysm) in her father; Bladder Cancer in her brother; Breast cancer in her maternal aunt; Dementia in her mother. There is no history of Colon cancer, Stomach cancer, Esophageal cancer, or Pancreatic cancer.  ROS:   All other ROS reviewed and negative. Pertinent positives noted in the HPI.     EKGs/Labs/Other Studies Reviewed:   The following studies were personally reviewed by me today:  TTE 02/29/2020 1. Left ventricular  ejection fraction, by estimation, is 45 to 50%. The  left ventricle has mildly decreased function. The left ventricle  demonstrates regional wall motion abnormalities (see scoring  diagram/findings for description). Inferior  hypokinesis. There is mild left ventricular hypertrophy. Left ventricular  diastolic parameters are indeterminate.  2. Right ventricular systolic function is normal. The right ventricular  size is normal.  3. The mitral valve is normal in structure. Trivial mitral valve  regurgitation.  4. The aortic valve was not well visualized. Aortic valve regurgitation  is not visualized. No aortic stenosis is present.   Recent Labs: 03/04/2020: Magnesium 2.4 05/11/2020: B Natriuretic Peptide 129.3 06/20/2020: ALT 17; BUN 17; Creatinine, Ser 1.02; Hemoglobin 12.8; Platelets 260; Potassium 3.6; Sodium 142 08/22/2020: TSH 1.670   Recent Lipid Panel    Component Value Date/Time   CHOL 89 03/01/2020 0351   CHOL 184 06/19/2018 0958   TRIG 101 03/01/2020 0351   HDL 39 (L) 03/01/2020 0351   HDL 45 06/19/2018 0958   CHOLHDL 2.3 03/01/2020 0351   VLDL 20 03/01/2020 0351   LDLCALC 30 03/01/2020 0351   LDLCALC 107 (H) 06/19/2018 0958    Physical Exam:   VS:  BP (!) 142/72 (BP Location: Right Arm, Patient Position: Sitting, Cuff Size: Normal)   Pulse (!) 56   Ht 5' (1.524 m)   Wt 178 lb 6.4 oz (80.9 kg)   SpO2 95%    BMI 34.84 kg/m    Wt Readings from Last 3 Encounters:  02/23/21 178 lb 6.4 oz (80.9 kg)  01/24/21 180 lb 1.9 oz (81.7 kg)  09/29/20 173 lb (78.5 kg)    General: Well nourished, well developed, in no acute distress Head: Atraumatic, normal size  Eyes: PEERLA, EOMI  Neck: Supple, no JVD Endocrine: No thryomegaly Cardiac: Normal S1, S2; RRR; no murmurs, rubs, or gallops Lungs: Clear to auscultation bilaterally, no wheezing, rhonchi or rales  Abd: Soft, nontender, no hepatomegaly  Ext: No edema, pulses 2+ Musculoskeletal: No deformities, BUE and BLE strength normal and equal Skin: Warm and dry, no rashes   Neuro: Alert and oriented to person, place, time, and situation, CNII-XII grossly intact, no focal deficits  Psych: Normal mood and affect   ASSESSMENT:   Judy [name] is a 80 y.o. female who presents for the following: 1. Coronary artery disease involving coronary bypass graft of native heart without angina pectoris   2. Mixed hyperlipidemia   3. Essential hypertension   4. PAF (paroxysmal atrial fibrillation) ([place])     PLAN:   1. Coronary artery disease involving coronary bypass graft of native heart without angina pectoris 2. Mixed hyperlipidemia -Non-STEMI in March 2021.  Status post CABG x5. -No symptoms of angina.  Euvolemic on examination. -We will stop her Plavix.  She will go on aspirin.  She is on Eliquis for atrial fibrillation.  She denies any symptoms today.  Seems to be doing well.  Her most recent LDL cholesterol was 61.  We will continue her Lipitor. -I want her to continue to work on her diabetes.  She is on Jardiance.  It appears her diet has deteriorated.  She should work on this more.  Also 150 minutes of moderate to vigorous physical activity have been recommended.  3. Essential hypertension -Blood pressure is stable.  4. PAF (paroxysmal atrial fibrillation) ([facility]) -No recurrence of atrial fibrillation.  Continue Eliquis 5 mg twice daily.  Disposition:  Return in about 6 months (around 08/26/2021).  Medication Adjustments/Labs and Tests Ordered: Current  medicines are reviewed at length with the patient today.  Concerns regarding medicines are outlined above.  No orders of the defined types were placed in this encounter.  Meds ordered this encounter  Medications  . aspirin EC 81 MG tablet    Sig: Take 1 tablet (81 mg total) by mouth daily. Swallow whole.    Dispense:  90 tablet    Refill:  3    Patient Instructions  Medication Instructions:  Stop Plavix Start Aspirin 81 mg daily   *If you need a refill on your cardiac medications before your next appointment, please call your pharmacy*  Follow-Up: At [facility], you and your health needs are our priority.  As part of our continuing mission to provide you with exceptional heart care, we have created designated Provider Care Teams.  These Care Teams include your primary Cardiologist (physician) and Advanced Practice Providers (APPs -  Physician Assistants and Nurse Practitioners) who all work together to provide you with the care you need, when you need it.  We recommend signing up for the patient portal called "MyChart".  Sign up information is provided on this After Visit Summary.  MyChart is used to connect with patients for Virtual Visits (Telemedicine).  Patients are able to view lab/test results, encounter notes, upcoming appointments, etc.  Non-urgent messages can be sent to your provider as well.   To learn more about what you can do with MyChart, go to [email or web address].    Your next appointment:   6 month(s)  The format for your next appointment:   In Person  Provider:   Eleonore Chiquito, MD       Time Spent with Patient: I have spent a total of 25 minutes with patient reviewing hospital notes, telemetry, EKGs, labs and examining the patient as well as establishing an assessment and plan that was discussed with the patient.  > 50% of time was spent in direct  patient care.  Signed, Addison Naegeli. Audie Box, MD, [place]  [address] [place],  [address] [phone]  02/23/2021 3:46 PM

## 2021-02-23 ENCOUNTER — Encounter: Payer: Self-pay | Admitting: Cardiovascular Disease

## 2021-02-23 ENCOUNTER — Other Ambulatory Visit: Payer: Self-pay

## 2021-02-23 ENCOUNTER — Ambulatory Visit: Payer: Medicare Other | Admitting: Cardiovascular Disease

## 2021-02-23 VITALS — BP 142/72 | HR 56 | Ht 60.0 in | Wt 178.4 lb

## 2021-02-23 DIAGNOSIS — I25810 Atherosclerosis of coronary artery bypass graft(s) without angina pectoris: Secondary | ICD-10-CM | POA: Diagnosis not present

## 2021-02-23 DIAGNOSIS — I480 Paroxysmal atrial fibrillation: Secondary | ICD-10-CM

## 2021-02-23 DIAGNOSIS — I10 Essential (primary) hypertension: Secondary | ICD-10-CM | POA: Diagnosis not present

## 2021-02-23 DIAGNOSIS — E782 Mixed hyperlipidemia: Secondary | ICD-10-CM

## 2021-02-23 MED ORDER — ASPIRIN EC 81 MG PO TBEC: 81.0000 mg | DELAYED_RELEASE_TABLET | Freq: Every day | ORAL | 3 refills | Status: DC

## 2021-02-23 NOTE — Patient Instructions (Signed)
Medication Instructions:  Stop Plavix Start Aspirin 81 mg daily   *If you need a refill on your cardiac medications before your next appointment, please call your pharmacy*  Follow-Up: At [facility], you and your health needs are our priority.  As part of our continuing mission to provide you with exceptional heart care, we have created designated Provider Care Teams.  These Care Teams include your primary Cardiologist (physician) and Advanced Practice Providers (APPs -  Physician Assistants and Nurse Practitioners) who all work together to provide you with the care you need, when you need it.  We recommend signing up for the patient portal called "MyChart".  Sign up information is provided on this After Visit Summary.  MyChart is used to connect with patients for Virtual Visits (Telemedicine).  Patients are able to view lab/test results, encounter notes, upcoming appointments, etc.  Non-urgent messages can be sent to your provider as well.   To learn more about what you can do with MyChart, go to [email or web address].    Your next appointment:   6 month(s)  The format for your next appointment:   In Person  Provider:   Eleonore Chiquito, MD

## 2021-02-24 ENCOUNTER — Encounter (HOSPITAL_COMMUNITY)
Admission: RE | Admit: 2021-02-24 | Discharge: 2021-02-24 | Disposition: A | Discharge status: Home / Self Care | Payer: Medicare Other | Source: Ambulatory Visit | Attending: Cardiovascular Disease | Admitting: Cardiovascular Disease

## 2021-02-24 DIAGNOSIS — Z951 Presence of aortocoronary bypass graft: Secondary | ICD-10-CM

## 2021-02-24 DIAGNOSIS — I214 Non-ST elevation (NSTEMI) myocardial infarction: Secondary | ICD-10-CM | POA: Diagnosis not present

## 2021-02-27 ENCOUNTER — Encounter (HOSPITAL_COMMUNITY)
Admission: RE | Admit: 2021-02-27 | Discharge: 2021-02-27 | Disposition: A | Discharge status: Home / Self Care | Payer: Medicare Other | Source: Ambulatory Visit | Attending: Cardiovascular Disease | Admitting: Cardiovascular Disease

## 2021-02-27 ENCOUNTER — Other Ambulatory Visit: Payer: Self-pay

## 2021-02-27 ENCOUNTER — Encounter (HOSPITAL_COMMUNITY): Payer: Medicare Other

## 2021-02-27 DIAGNOSIS — Z951 Presence of aortocoronary bypass graft: Secondary | ICD-10-CM

## 2021-02-27 DIAGNOSIS — I214 Non-ST elevation (NSTEMI) myocardial infarction: Secondary | ICD-10-CM | POA: Diagnosis not present

## 2021-02-27 NOTE — Progress Notes (Signed)
Discharge Progress Report  Patient Details  Name: Judy [name] MRN: [id number] Date of Birth: [birth year] Referring Provider:   Flowsheet Row CARDIAC REHAB PHASE II ORIENTATION from 01/24/2021 in [place]  Referring Provider O'Neal, Cassie Freer, MD       Number of Visits: 8  Reason for Discharge:  Patient reached a stable level of exercise.  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  ADL UCSD:   Initial Exercise Prescription:  Initial Exercise Prescription - 01/24/21 1400      Date of Initial Exercise RX and Referring Provider   Date 01/24/21    Referring Provider Geralynn Rile, MD    Expected Discharge Date 02/24/21      T5 Nustep   Level 1    SPM 85    Minutes 15    METs 1.8      Prescription Details   Frequency (times per week) 3    Duration Progress to 30 minutes of continuous aerobic without signs/symptoms of physical distress      Intensity   THRR 40-80% of Max Heartrate 57-114    Ratings of Perceived Exertion 11-13    Perceived Dyspnea 0-4      Progression   Progression Continue to progress workloads to maintain intensity without signs/symptoms of physical distress.      Resistance Training   Training Prescription Yes    Weight 2 lbs    Reps 10-15           Discharge Exercise Prescription (Final Exercise Prescription Changes):  Exercise Prescription Changes - 02/27/21 0924      Response to Exercise   Blood Pressure (Admit) 120/58    Blood Pressure (Exercise) 130/74    Blood Pressure (Exit) 110/58    Heart Rate (Admit) 73 bpm    Heart Rate (Exercise) 86 bpm    Heart Rate (Exit) 75 bpm    Rating of Perceived Exertion (Exercise) 11    Symptoms none    Comments Pt graduated from [employer] program and reviewed home EX RX    Duration Continue with 30 min of aerobic exercise without signs/symptoms of  physical distress.    Intensity THRR unchanged      Progression   Progression Continue to progress workloads to maintain intensity without signs/symptoms of physical distress.    Average METs 2.4      Resistance Training   Training Prescription Yes    Weight 2 lbs    Reps 10-15    Time 10 Minutes      Interval Training   Interval Training No      T5 Nustep   Level 2    SPM 75    Minutes 30    METs 2.4      Home Exercise Plan   Plans to continue exercise at [employer] [facility] [employer] (comment)    Frequency Add 3 additional days to program exercise sessions.    Initial Home Exercises Provided 02/27/21           Functional Capacity:  6 Minute Walk    Row Name 01/24/21 1337         6 Minute Walk   Phase Initial     Distance 1006 feet     Walk Time 6 minutes     # of Rest Breaks 0     MPH 1.9  METS 1.81     RPE 12     Perceived Dyspnea  1     VO2 Peak 6.34     Symptoms Yes (comment)     Comments Patient c/o mild SOB, fatigue.     Resting HR 85 bpm     Resting BP 114/78     Resting Oxygen Saturation  95 %     Exercise Oxygen Saturation  during 6 min walk 96 %     Max Ex. HR 112 bpm     Max Ex. BP 162/62     2 Minute Post BP 124/52            Psychological, QOL, Others - Outcomes: PHQ 2/9: Depression screen [facility] 2/9 02/27/2021 01/30/2021 05/17/2020 04/19/2020 04/04/2020  Decreased Interest 0 '1 2 1 ' -  Down, Depressed, Hopeless 0 0 2 1 -  PHQ - 2 Score 0 '1 4 2 ' -  Altered sleeping - - 3 1 -  Tired, decreased energy - - 2 1 -  Change in appetite - - 3 1 -  Feeling bad or failure about yourself  - - 1 0 -  Trouble concentrating - - 0 0 -  Moving slowly or fidgety/restless - - 1 0 -  Suicidal thoughts - - 0 0 0  PHQ-9 Score - - 14 5 -  Difficult doing work/chores - - - Not difficult at all -  Some recent data might be hidden    Quality of Life:  Quality of Life - 01/24/21 1420      Quality of Life   Select Quality of Life      Quality of Life Scores    Health/Function Pre 12.4 %    Socioeconomic Pre 20.86 %    Psych/Spiritual Pre 14.07 %    Family Pre 13.5 %    GLOBAL Pre 14.68 %           Personal Goals: Goals established at orientation with interventions provided to work toward goal.  Personal Goals and Risk Factors at Admission - 01/24/21 1421      Core Components/Risk Factors/Patient Goals on Admission    Weight Management Yes;Obesity    Intervention Weight Management/Obesity: Establish reasonable short term and long term weight goals.;Obesity: Provide education and appropriate resources to help participant work on and attain dietary goals.    Admit Weight 180 lb 1.9 oz (81.7 kg)    Expected Outcomes Short Term: Continue to assess and modify interventions until short term weight is achieved;Long Term: Adherence to nutrition and physical activity/exercise program aimed toward attainment of established weight goal;Weight Loss: Understanding of general recommendations for a balanced deficit meal plan, which promotes 1-2 lb weight loss per week and includes a negative energy balance of [id number] kcal/d;Weight Gain: Understanding of general recommendations for a high calorie, high protein meal plan that promotes weight gain by distributing calorie intake throughout the day with the consumption for 4-5 meals, snacks, and/or supplements;Understanding of distribution of calorie intake throughout the day with the consumption of 4-5 meals/snacks;Understanding recommendations for meals to include 15-35% energy as protein, 25-35% energy from fat, 35-60% energy from carbohydrates, less than 245m of dietary cholesterol, 20-35 gm of total fiber daily    Diabetes Yes    Intervention Provide education about signs/symptoms and action to take for hypo/hyperglycemia.;Provide education about proper nutrition, including hydration, and aerobic/resistive exercise prescription along with prescribed medications to achieve blood glucose in normal ranges: Fasting  glucose 65-99 mg/dL    Expected  Outcomes Short Term: Participant verbalizes understanding of the signs/symptoms and immediate care of hyper/hypoglycemia, proper foot care and importance of medication, aerobic/resistive exercise and nutrition plan for blood glucose control.;Long Term: Attainment of HbA1C < 7%.    Hypertension Yes    Intervention Provide education on lifestyle modifcations including regular physical activity/exercise, weight management, moderate sodium restriction and increased consumption of fresh fruit, vegetables, and low fat dairy, alcohol moderation, and smoking cessation.;Monitor prescription use compliance.    Expected Outcomes Short Term: Continued assessment and intervention until BP is < 140/35m HG in hypertensive participants. < 130/879mHG in hypertensive participants with diabetes, heart failure or chronic kidney disease.;Long Term: Maintenance of blood pressure at goal levels.    Lipids Yes    Intervention Provide education and support for participant on nutrition & aerobic/resistive exercise along with prescribed medications to achieve LDL <7053mHDL >11m28m  Expected Outcomes Short Term: Participant states understanding of desired cholesterol values and is compliant with medications prescribed. Participant is following exercise prescription and nutrition guidelines.;Long Term: Cholesterol controlled with medications as prescribed, with individualized exercise RX and with personalized nutrition plan. Value goals: LDL < 70mg13mL > 40 mg.    Stress Yes    Intervention Offer individual and/or small group education and counseling on adjustment to heart disease, stress management and health-related lifestyle change. Teach and support self-help strategies.;Refer participants experiencing significant psychosocial distress to appropriate mental health specialists for further evaluation and treatment. When possible, include family members and significant others in  education/counseling sessions.    Expected Outcomes Short Term: Participant demonstrates changes in health-related behavior, relaxation and other stress management skills, ability to obtain effective social support, and compliance with psychotropic medications if prescribed.;Long Term: Emotional wellbeing is indicated by absence of clinically significant psychosocial distress or social isolation.            Personal Goals Discharge:  Goals and Risk Factor Review    Row Name 01/31/21 1702 03/20/21 1626           Core Components/Risk Factors/Patient Goals Review   Personal Goals Review Weight Management/Obesity;Hypertension;Diabetes;Lipids Weight Management/Obesity;Hypertension;Diabetes;Lipids      Review NancyCaniyated exercise on 01/30/21 and did well with exercise. Vital signs stable Tannis completed exercise on 02/27/21. NancyIzora Galas to continue [facility] at the [facility] [place] continue to particpate in phase 2 cardiac rehab for exercise, nutrition and lifestyle modifications NancyJordyn continue to  exercise, follow nutrition and lifestyle modifications upon completion of phase 2 cardiac rehab             Exercise Goals and Review:  Exercise Goals    Row Name 01/24/21 1328             Exercise Goals   Increase Physical Activity Yes       Intervention Provide advice, education, support and counseling about physical activity/exercise needs.;Develop an individualized exercise prescription for aerobic and resistive training based on initial evaluation findings, risk stratification, comorbidities and participant's personal goals.       Expected Outcomes Short Term: Attend rehab on a regular basis to increase amount of physical activity.;Long Term: Exercising regularly at least 3-5 days a week.;Long Term: Add in home exercise to make exercise part of routine and to increase amount of physical activity.       Increase Strength and Stamina Yes       Intervention Provide  advice, education, support and counseling about physical activity/exercise needs.;Develop an individualized  exercise prescription for aerobic and resistive training based on initial evaluation findings, risk stratification, comorbidities and participant's personal goals.       Expected Outcomes Short Term: Increase workloads from initial exercise prescription for resistance, speed, and METs.;Short Term: Perform resistance training exercises routinely during rehab and add in resistance training at home;Long Term: Improve cardiorespiratory fitness, muscular endurance and strength as measured by increased METs and functional capacity (6MWT)       Able to understand and use rate of perceived exertion (RPE) scale Yes       Intervention Provide education and explanation on how to use RPE scale       Expected Outcomes Short Term: Able to use RPE daily in rehab to express subjective intensity level;Long Term:  Able to use RPE to guide intensity level when exercising independently       Knowledge and understanding of Target Heart Rate Range (THRR) Yes       Intervention Provide education and explanation of THRR including how the numbers were predicted and where they are located for reference       Expected Outcomes Short Term: Able to state/look up THRR;Long Term: Able to use THRR to govern intensity when exercising independently;Short Term: Able to use daily as guideline for intensity in rehab       Able to check pulse independently Yes       Intervention Provide education and demonstration on how to check pulse in carotid and radial arteries.;Review the importance of being able to check your own pulse for safety during independent exercise       Expected Outcomes Short Term: Able to explain why pulse checking is important during independent exercise;Long Term: Able to check pulse independently and accurately       Understanding of Exercise Prescription Yes       Intervention Provide education, explanation, and  written materials on patient's individual exercise prescription       Expected Outcomes Short Term: Able to explain program exercise prescription;Long Term: Able to explain home exercise prescription to exercise independently              Exercise Goals Re-Evaluation:  Exercise Goals Re-Evaluation    Row Name 01/30/21 1447 02/27/21 1336 03/02/21 0931         Exercise Goal Re-Evaluation   Exercise Goals Review Increase Physical Activity;Increase Strength and Stamina;Able to understand and use rate of perceived exertion (RPE) scale;Knowledge and understanding of Target Heart Rate Range (THRR);Understanding of Exercise Prescription Increase Physical Activity;Increase Strength and Stamina;Able to understand and use rate of perceived exertion (RPE) scale;Knowledge and understanding of Target Heart Rate Range (THRR);Understanding of Exercise Prescription --     Comments Pt's first day in the [facility] program. Pt understands the RPE scale, THRR, and Exercise Rx. Pt graduated [facility] program today. Reviewed home EX RX with pt. Pt had an average MET level of 2.4. Pt had sporaditic attendance and was not able to progress further. Pt voiced plan to cont. ex at the [facility]. Pt encouraged to exercise 3-5 days a week. --     Expected Outcomes Will continue to monitor patient and progress exercise workloads as tolerated. Pt cont. to exercise at home and [facility] --            Nutrition & Weight - Outcomes:  Pre Biometrics - 01/24/21 1313      Pre Biometrics   Waist Circumference 39 inches    Hip Circumference 45.75 inches    Waist to Hip Ratio  0.85 %    Triceps Skinfold 28 mm    % Body Fat 46.2 %    Grip Strength 23 kg    Flexibility 0 in    Single Leg Stand 7.06 seconds            Nutrition:  Nutrition Therapy & Goals - 02/10/21 1410      Nutrition Therapy   Diet TLC    Drug/Food Interactions Statins/Certain Fruits      Personal Nutrition Goals   Nutrition Goal Pt to identify food quantities  necessary to achieve weight loss of 5-10 lb at graduation from cardiac rehab.    Personal Goal #2 Pt to build a healthy plate including vegetables, fruits, whole grains, and low-fat dairy products in a heart healthy meal plan.      Intervention Plan   Intervention Prescribe, educate and counsel regarding individualized specific dietary modifications aiming towards targeted core components such as weight, hypertension, lipid management, diabetes, heart failure and other comorbidities.;Nutrition handout(s) given to patient.    Expected Outcomes Short Term Goal: Understand basic principles of dietary content, such as calories, fat, sodium, cholesterol and nutrients.           Nutrition Discharge:   Education Questionnaire Score:  Knowledge Questionnaire Score - 01/24/21 1421      Knowledge Questionnaire Score   Pre Score 20/24           Goals reviewed with patient; copy given to patient. Cherissa completed 8 exercise sessions between 01/30/21-02/27/21 as [name] returned to cardiac rehab for a second time after completing physical therapy. Brigett did well with exercise for her fitness level. Saima says that she is going to the [facility] after completing cardiac rehab.Barnet Pall, RN,BSN 03/20/2021 4:33 PM

## 2021-03-02 NOTE — Progress Notes (Signed)
Reviewed home exercise Rx with patient today. Pt plans to join the [facility] to exercise. Encouraged 3-5 x/week for 30 minutes. Pt will also walk. Encouraged patient to continue to use her walker for safety. Encouraged warm-up, cool-down, and stretching. Reviewed THRR 57-114 and keeping the RPE between 11-13. Hydration encouraged with activity. Discussed weather parameters for temperature and humidity for safety exercise outdoors. Reviewed S/S to terminate exercise and when to call MD vs 911. Pt verbalized understanding of home exercise Rx and was provided a copy.  Lesly Rubenstein MS, ACSM-CEP, CCRP

## 2021-03-03 ENCOUNTER — Encounter (HOSPITAL_COMMUNITY): Payer: Medicare Other

## 2021-03-06 ENCOUNTER — Encounter (HOSPITAL_COMMUNITY): Payer: Medicare Other

## 2021-03-10 ENCOUNTER — Encounter (HOSPITAL_COMMUNITY): Payer: Medicare Other

## 2021-03-20 NOTE — Addendum Note (Signed)
Encounter addended by: Magda Kiel, RN on: 03/20/2021 4:35 PM  Actions taken: Flowsheet data copied forward, Flowsheet accepted, Clinical Note Signed, Episode resolved

## 2021-04-15 ENCOUNTER — Telehealth: Payer: Self-pay | Admitting: Physician Assistant

## 2021-04-15 NOTE — Telephone Encounter (Signed)
Pt called stating she had racing heart this morning and felt like she couldn't breathe. She called a friend who came to her house. BG was also elevated in the 200s. BP 140 range.  All symptoms have resolved at this time. She forgot to take her evening medications last night, including metformin and metoprolol. I have advised he to rest at home, do not drive, and take medications this evening. She will call back if symptoms occur. If SOB, chest pain, or heart racing she will call EMS. She expressed understanding of the plan.

## 2021-05-03 ENCOUNTER — Other Ambulatory Visit: Payer: Self-pay | Admitting: Cardiovascular Disease

## 2021-05-11 ENCOUNTER — Other Ambulatory Visit: Payer: Self-pay | Admitting: Critical Care Medicine

## 2021-05-11 DIAGNOSIS — E1122 Type 2 diabetes mellitus with diabetic chronic kidney disease: Secondary | ICD-10-CM

## 2021-05-12 ENCOUNTER — Other Ambulatory Visit: Payer: Self-pay | Admitting: Cardiovascular Disease

## 2021-05-23 ENCOUNTER — Other Ambulatory Visit: Payer: Self-pay | Admitting: Critical Care Medicine

## 2021-05-23 DIAGNOSIS — E1122 Type 2 diabetes mellitus with diabetic chronic kidney disease: Secondary | ICD-10-CM

## 2021-05-23 DIAGNOSIS — N183 Chronic kidney disease, stage 3 unspecified: Secondary | ICD-10-CM

## 2021-05-23 NOTE — Telephone Encounter (Signed)
   Notes to clinic:  review for future refill Looks like a patient see another provider    Requested Prescriptions  Pending Prescriptions Disp Refills   Accu-Chek Softclix Lancets lancets [Pharmacy Med Name: SOFTCLIX LANCETS] 100 each     Sig: USE TO [facility]      Endocrinology: Diabetes - Testing Supplies Failed - 05/23/2021  8:03 AM      Failed - Valid encounter within last 12 months    Recent Outpatient Visits           1 year ago Controlled type 2 diabetes mellitus with other circulatory complication, with long-term current use of insulin ([place])   [place] Elsie Stain, MD   1 year ago Controlled type 2 diabetes mellitus with other circulatory complication, with long-term current use of insulin [facility])   [place] Elsie Stain, MD   1 year ago Controlled type 2 diabetes mellitus with other circulatory complication, with long-term current use of insulin [facility])   [place] Elsie Stain, MD   1 year ago Type 2 diabetes mellitus with stage 3a chronic kidney disease, without long-term current use of insulin ([place])   [place] Elsie Stain, MD   1 year ago Pneumonia due to COVID-19 virus   [place], MD       Future Appointments             In 3 months O'Neal, Cassie Freer, MD [facility]

## 2021-07-11 ENCOUNTER — Ambulatory Visit
Admission: RE | Admit: 2021-07-11 | Discharge: 2021-07-11 | Disposition: A | Discharge status: Home / Self Care | Payer: Medicare Other | Source: Ambulatory Visit | Attending: Internal Medicine | Admitting: Internal Medicine

## 2021-07-11 ENCOUNTER — Other Ambulatory Visit: Payer: Self-pay

## 2021-07-11 DIAGNOSIS — Z13820 Encounter for screening for osteoporosis: Secondary | ICD-10-CM

## 2021-07-12 ENCOUNTER — Other Ambulatory Visit: Payer: Self-pay

## 2021-07-12 ENCOUNTER — Emergency Department (HOSPITAL_BASED_OUTPATIENT_CLINIC_OR_DEPARTMENT_OTHER)
Admission: EM | Admit: 2021-07-12 | Discharge: 2021-07-12 | Disposition: A | Discharge status: Home / Self Care | Payer: Medicare Other | Attending: Emergency Medicine | Admitting: Emergency Medicine

## 2021-07-12 ENCOUNTER — Emergency Department (HOSPITAL_BASED_OUTPATIENT_CLINIC_OR_DEPARTMENT_OTHER): Payer: Medicare Other

## 2021-07-12 ENCOUNTER — Encounter (HOSPITAL_BASED_OUTPATIENT_CLINIC_OR_DEPARTMENT_OTHER): Payer: Self-pay

## 2021-07-12 DIAGNOSIS — Z8616 Personal history of COVID-19: Secondary | ICD-10-CM | POA: Diagnosis not present

## 2021-07-12 DIAGNOSIS — R0602 Shortness of breath: Secondary | ICD-10-CM | POA: Diagnosis present

## 2021-07-12 DIAGNOSIS — I509 Heart failure, unspecified: Secondary | ICD-10-CM | POA: Diagnosis not present

## 2021-07-12 DIAGNOSIS — Z79899 Other long term (current) drug therapy: Secondary | ICD-10-CM | POA: Insufficient documentation

## 2021-07-12 DIAGNOSIS — I110 Hypertensive heart disease with heart failure: Secondary | ICD-10-CM | POA: Diagnosis not present

## 2021-07-12 DIAGNOSIS — I2510 Atherosclerotic heart disease of native coronary artery without angina pectoris: Secondary | ICD-10-CM | POA: Insufficient documentation

## 2021-07-12 DIAGNOSIS — Z951 Presence of aortocoronary bypass graft: Secondary | ICD-10-CM | POA: Diagnosis not present

## 2021-07-12 DIAGNOSIS — E119 Type 2 diabetes mellitus without complications: Secondary | ICD-10-CM | POA: Diagnosis not present

## 2021-07-12 DIAGNOSIS — R0601 Orthopnea: Secondary | ICD-10-CM | POA: Diagnosis not present

## 2021-07-12 DIAGNOSIS — R600 Localized edema: Secondary | ICD-10-CM | POA: Diagnosis not present

## 2021-07-12 DIAGNOSIS — Z7982 Long term (current) use of aspirin: Secondary | ICD-10-CM | POA: Insufficient documentation

## 2021-07-12 DIAGNOSIS — Z7984 Long term (current) use of oral hypoglycemic drugs: Secondary | ICD-10-CM | POA: Insufficient documentation

## 2021-07-12 DIAGNOSIS — Z7901 Long term (current) use of anticoagulants: Secondary | ICD-10-CM | POA: Diagnosis not present

## 2021-07-12 LAB — CBC
HCT: 43.5 % (ref 36.0–46.0)
Hemoglobin: 14.9 g/dL (ref 12.0–15.0)
MCH: 30.8 pg (ref 26.0–34.0)
MCHC: 34.3 g/dL (ref 30.0–36.0)
MCV: 90.1 fL (ref 80.0–100.0)
Platelets: 194 10*3/uL (ref 150–400)
RBC: 4.83 MIL/uL (ref 3.87–5.11)
RDW: 13.1 % (ref 11.5–15.5)
WBC: 8.6 10*3/uL (ref 4.0–10.5)
nRBC: 0 % (ref 0.0–0.2)

## 2021-07-12 LAB — TROPONIN I (HIGH SENSITIVITY)
Troponin I (High Sensitivity): 11 ng/L (ref <18)
Troponin I (High Sensitivity): 9 ng/L (ref <18)

## 2021-07-12 LAB — COMPREHENSIVE METABOLIC PANEL
ALT: 12 U/L (ref 0–44)
AST: 11 U/L — ABNORMAL LOW (ref 15–41)
Albumin: 3.9 g/dL (ref 3.5–5.0)
Alkaline Phosphatase: 106 U/L (ref 38–126)
Anion gap: 9 (ref 5–15)
BUN: 16 mg/dL (ref 8–23)
CO2: 25 mmol/L (ref 22–32)
Calcium: 9.2 mg/dL (ref 8.9–10.3)
Chloride: 107 mmol/L (ref 98–111)
Creatinine, Ser: 1.07 mg/dL — ABNORMAL HIGH (ref 0.44–1.00)
GFR, Estimated: 53 mL/min — ABNORMAL LOW (ref >60)
Glucose, Bld: 184 mg/dL — ABNORMAL HIGH (ref 70–99)
Potassium: 4.2 mmol/L (ref 3.5–5.1)
Sodium: 141 mmol/L (ref 135–145)
Total Bilirubin: 0.5 mg/dL (ref 0.3–1.2)
Total Protein: 6.5 g/dL (ref 6.5–8.1)

## 2021-07-12 LAB — MAGNESIUM: Magnesium: 1.8 mg/dL (ref 1.7–2.4)

## 2021-07-12 LAB — BRAIN NATRIURETIC PEPTIDE: B Natriuretic Peptide: 313.6 pg/mL — ABNORMAL HIGH (ref 0.0–100.0)

## 2021-07-12 MED ORDER — FUROSEMIDE 10 MG/ML IJ SOLN
60.0000 mg | Freq: Once | INTRAMUSCULAR | Status: AC
  Administered 2021-07-12: 60 mg via INTRAVENOUS
  Filled 2021-07-12: qty 6

## 2021-07-12 MED ORDER — NITROGLYCERIN 2 % TD OINT
0.5000 [in_us] | TOPICAL_OINTMENT | Freq: Once | TRANSDERMAL | Status: AC
  Administered 2021-07-12: 0.5 [in_us] via TOPICAL
  Filled 2021-07-12: qty 1

## 2021-07-12 NOTE — ED Provider Notes (Signed)
[facility] EMERGENCY DEPT Provider Note  CSN: [id number] Arrival date & time: 07/12/21 7408  Chief Complaint(s) Shortness of Breath  HPI Judy [name] is a 80 y.o. female with a past medical history listed below including prior heart attacks,heart failure with a EF of 45 to 50% on Lasix here for several days of orthopnea.  Patient feels like she is suffocating while lying down.  Episode was severe tonight prompting her visit.  This improves with sitting up, and even more so while standing.  She denies any associated chest pain.  No recent fevers or infections.  No coughing or congestion.  Patient endorses increased peripheral edema and an 8 pound weight gain in the last several days.  She has not been taking her morning Lasix for the past week.  HPIHPIHPIHPIHPI  Past Medical History Past Medical History:  Diagnosis Date   Anxiety    Back pain    Complication of anesthesia 2003   gallbladder surgery-resp. arrest-stop operation and pt. went to ICU - patient stated she has back surgery since then and had no trouble at all   Coronary artery disease    Depression    Diabetes ([place])    Dyspnea    Gout    Heart attack [facility])    March 2021   History of kidney stones    Hyperlipidemia    Hypertension    Leg edema    Pancreatitis, chronic ([place]) 03/2016   Pneumonia due to COVID-19 virus 12/03/2019   Sleep apnea    Stroke [facility])    March 2021   Patient Active Problem List   Diagnosis Date Noted   Memory loss 08/22/2020   Dementia with behavioral disturbance ([place]) 08/22/2020   Coronary artery disease 05/17/2020   Pleural effusion 04/12/2020   Atrial fibrillation ([place]) 03/11/2020   S/P CABG (coronary artery bypass graft) 03/03/2020   Cerebral thrombosis with cerebral infarction 03/02/2020   Hyperlipidemia associated with type 2 diabetes mellitus ([place]) 02/09/2020   Type 2 diabetes mellitus, controlled ([place]) 01/12/2020   Class 2 severe obesity with serious comorbidity and  body mass index (BMI) of 35.0 to 35.9 in adult, unspecified obesity type ([place]) 12/25/2018   Obstructive sleep apnea 11/04/2017   Lumbar stenosis with neurogenic claudication 11/19/2016   HTN (hypertension) 03/21/2016   Anxiety    Depression    Home Medication(s) Prior to Admission medications   Medication Sig Start Date End Date Taking? Authorizing Provider  Accu-Chek Softclix Lancets lancets USE TO CHECK BLOOD SUGAR TWICE DAILY 05/11/21   Elsie Stain, MD  aspirin EC 81 MG tablet Take 1 tablet (81 mg total) by mouth daily. Swallow whole. 02/23/21   O'NealCassie Freer, MD  atorvastatin (LIPITOR) 20 MG tablet Take 1 tablet (20 mg total) by mouth at bedtime. 05/04/20   O'Neal, Cassie Freer, MD  cephALEXin (KEFLEX) 500 MG capsule Take 500 mg by mouth 2 (two) times daily. 02/22/21   [provider]  Cyanocobalamin (B-12 COMPLIANCE INJECTION) 1000 MCG/ML KIT Inject 1,000 mcg as directed every 30 (thirty) days.    [provider]  DULoxetine (CYMBALTA) 60 MG capsule Take 60 mg by mouth daily.    [provider]  ELIQUIS 5 MG TABS tablet TAKE 1 TABLET(5 MG) BY MOUTH TWICE DAILY 12/05/20   O'Neal, Cassie Freer, MD  empagliflozin (JARDIANCE) 25 MG TABS tablet  02/02/20   [provider]  furosemide (LASIX) 20 MG tablet TAKE 1 TABLET(20 MG) BY MOUTH DAILY 05/03/21   O'Neal,  Cassie Freer, MD  glucose blood (ACCU-CHEK AVIVA) test strip Test twice daily 04/04/20   Elsie Stain, MD  losartan (COZAAR) 25 MG tablet Take 1 tablet (25 mg total) by mouth daily. 04/04/20   Elsie Stain, MD  metFORMIN (GLUCOPHAGE-XR) 750 MG 24 hr tablet Take 750 mg by mouth daily with breakfast. 10/11/20   [provider]  methocarbamol (ROBAXIN) 500 MG tablet Take 500 mg by mouth every 8 (eight) hours as needed for muscle spasms.    [provider]  metoprolol tartrate (LOPRESSOR) 25 MG tablet TAKE 1/2 TABLET(12.5 MG) BY MOUTH TWICE DAILY 08/23/20   O'Neal, Cassie Freer,  MD  NON FORMULARY 1 each by Other route See admin instructions. CPAP machine nightly    [provider]  traZODone (DESYREL) 100 MG tablet Take 150 mg by mouth at bedtime. 04/28/20   [provider]                                                                                                                                    Past Surgical History Past Surgical History:  Procedure Laterality Date   BACK SURGERY     BREAST LUMPECTOMY WITH RADIOACTIVE SEED LOCALIZATION Right 10/23/2019   Procedure: RIGHT BREAST LUMPECTOMY WITH RADIOACTIVE SEED LOCALIZATION;  Surgeon: Jovita Kussmaul, MD;  Location: [facility];  Service: General;  Laterality: Right;   CARDIAC CATHETERIZATION     CARDIAC SURGERY     CHOLECYSTECTOMY     COLONOSCOPY  2013   CORONARY ARTERY BYPASS GRAFT N/A 03/03/2020   Procedure: CORONARY ARTERY BYPASS GRAFTING (CABG), ON PUMP, TIMES FIVE, USING LEFT INTERNAL MAMMARY ARTERY AND ENDOSCOPICALLY HARVESTED BILATERAL GREATER SAPHENOUS VEINS -flow track only;  Surgeon: Lajuana Matte, MD;  Location: [facility];  Service: Open Heart Surgery;  Laterality: N/A;   LEFT HEART CATH AND CORONARY ANGIOGRAPHY N/A 03/01/2020   Procedure: LEFT HEART CATH AND CORONARY ANGIOGRAPHY;  Surgeon: Belva Crome, MD;  Location: [place] [facility];  Service: Cardiovascular;  Laterality: N/A;   TEE WITHOUT CARDIOVERSION N/A 03/03/2020   Procedure: TRANSESOPHAGEAL ECHOCARDIOGRAM (TEE);  Surgeon: Lajuana Matte, MD;  Location: [place];  Service: Open Heart Surgery;  Laterality: N/A;   Family History Family History  Problem Relation Age of Onset   Dementia Mother    AAA (abdominal aortic aneurysm) Father    Breast cancer Maternal Aunt    Bladder Cancer Brother    Colon cancer Neg Hx    Stomach cancer Neg Hx    Esophageal cancer Neg Hx    Pancreatic cancer Neg Hx     Social History Social History   Tobacco Use   Smoking status: Never   Smokeless tobacco: Never  Vaping Use   Vaping  Use: Never used  Substance Use Topics   Alcohol use: No    Alcohol/week: 0.0 standard drinks   Drug use: No   Allergies Patient  has no known allergies.  Review of Systems Review of Systems All other systems are reviewed and are negative for acute change except as noted in the HPI  Physical Exam Vital Signs  I have reviewed the triage vital signs BP (!) 176/68 (BP Location: Right Arm)   Pulse 60   Temp 97.7 F (36.5 C) (Oral)   Resp 19   Ht _0  (1.651 m)   Wt 80.9 kg   SpO2 98%   BMI 29.68 kg/m   Physical Exam Vitals reviewed.  Constitutional:      General: She is not in acute distress.    Appearance: She is well-developed. She is not diaphoretic.  HENT:     Head: Normocephalic and atraumatic.     Nose: Nose normal.  Eyes:     General: No scleral icterus.       Right eye: No discharge.        Left eye: No discharge.     Conjunctiva/sclera: Conjunctivae normal.     Pupils: Pupils are equal, round, and reactive to light.  Cardiovascular:     Rate and Rhythm: Normal rate and regular rhythm.     Heart sounds: No murmur heard.   No friction rub. No gallop.  Pulmonary:     Effort: Pulmonary effort is normal. No respiratory distress.     Breath sounds: Normal breath sounds. No stridor. No rales.  Abdominal:     General: There is no distension.     Palpations: Abdomen is soft.     Tenderness: There is no abdominal tenderness.  Musculoskeletal:        General: No tenderness.     Cervical back: Normal range of motion and neck supple.     Right lower leg: 2+ Pitting Edema present.     Left lower leg: 2+ Pitting Edema present.  Skin:    General: Skin is warm and dry.     Findings: No erythema or rash.  Neurological:     Mental Status: She is alert and oriented to person, place, and time.    ED Results and Treatments Labs (all labs ordered are listed, but only abnormal results are displayed) Labs Reviewed  MAGNESIUM  COMPREHENSIVE METABOLIC PANEL  CBC  BRAIN  NATRIURETIC PEPTIDE  TROPONIN I (HIGH SENSITIVITY)                                                                                                                         EKG  EKG Interpretation  Date/Time:  Wednesday July 12 2021 06:06:58 EDT Ventricular Rate:  60 PR Interval:  186 QRS Duration: 95 QT Interval:  425 QTC Calculation: 425 R Axis:   35 Text Interpretation: Sinus rhythm Low voltage, precordial leads No significant change since last tracing Confirmed by Addison Lank [phone]) on 07/12/2021 6:50:17 AM       Radiology DG Bone Density  Result Date: 07/11/2021 EXAM: DUAL X-RAY ABSORPTIOMETRY (DXA) FOR BONE MINERAL DENSITY IMPRESSION: Referring Physician:  LAURA H WILE Your patient completed a bone mineral density test using GE Lunar iDXA system (analysis version: 16). Technologist: [place] PATIENT: Name: Rakiyah, Esch Patient ID: [id number] Birth Date: Sep 05, 1941 Height: 60.0 in. Sex: Female Measured: 07/11/2021 Weight: 187.0 lbs. Indications: Advanced Age, Bilateral Ovariectomy (65.51), Caucasian, Diabetic non insulin, Estrogen Deficient, Hysterectomy, Postmenopausal Fractures: Rib Treatments: None ASSESSMENT: The BMD measured at Femur Neck Right is 0.898 g/cm2 with a T-score of -1.0. This patient is considered normal according to [facility]) criteria. The quality of the exam is good. The lumbar spine was excluded due to degenerative and surgical changes. Site Region Measured Date Measured Age YA BMD Significant CHANGE T-score DualFemur Neck Right 07/11/2021 79.0 -1.0 0.898 g/cm2 DualFemur Total Mean 07/11/2021 79.0 1.1 1.149 g/cm2 Left Forearm Radius 33% 07/11/2021 79.0 0.4 0.925 g/cm2 World Health Organization [facility]) criteria for post-menopausal, Caucasian Women: Normal       T-score at or above -1 SD Osteopenia   T-score between -1 and -2.5 SD Osteoporosis T-score at or below -2.5 SD RECOMMENDATION: 1. All patients should optimize calcium and vitamin D intake. 2.  Consider FDA-approved medical therapies in postmenopausal women and men aged 80 years and older, based on the following: a. A hip or vertebral (clinical or morphometric) fracture. b. T-score = -2.5 at the femoral neck or spine after appropriate evaluation to exclude secondary causes. c. Low bone mass (T-score between -1.0 and -2.5 at the femoral neck or spine) and a 10-year probability of a hip fracture = 3% or a 10-year probability of a major osteoporosis-related fracture = 20% based on the [place]-adapted WHO algorithm. d. Clinician judgment and/or patient preferences may indicate treatment for people with 10-year fracture probabilities above or below these levels. FOLLOW-UP: Patients with diagnosis of osteoporosis or at high risk for fracture should have regular bone mineral density tests.? Patients eligible for Medicare are allowed routine testing every 2 years.? The testing frequency can be increased to one year for patients who have rapidly progressing disease, are receiving or discontinuing medical therapy to restore bone mass, or have additional risk factors. I have reviewed this study and agree with the findings. Mark A. Thornton Papas, M.D. [facility] Electronically Signed   By: Lavonia Dana M.D.   On: 07/11/2021 16:50    Pertinent labs & imaging results that were available during my care of the patient were reviewed by me and considered in my medical decision making (see MDM for details).  Medications Ordered in ED Medications  nitroGLYCERIN (NITROGLYN) 2 % ointment 0.5 inch (has no administration in time range)                                                                                                                                     Procedures .1-3 Lead EKG Interpretation  Date/Time: 07/12/2021 6:53 AM Performed by: Fatima Blank, MD Authorized by: Fatima Blank, MD  Interpretation: normal     ECG rate:  64   ECG rate assessment: normal     Rhythm: sinus  rhythm     Ectopy: none     Conduction: normal    (including critical care time)  Medical Decision Making / ED Course I have reviewed the nursing notes for this encounter and the patient's prior records (if available in EHR or on provided paperwork).  DELAYNEE ALRED was evaluated in Emergency Department on 07/12/2021 for the symptoms described in the history of present illness. She was evaluated in the context of the global COVID-19 pandemic, which necessitated consideration that the patient might be at risk for infection with the SARS-CoV-2 virus that causes COVID-19. Institutional protocols and algorithms that pertain to the evaluation of patients at risk for COVID-19 are in a state of rapid change based on information released by regulatory bodies including the CDC and federal and state organizations. These policies and algorithms were followed during the patient's care in the ED.     Patient presents with gradually worsening orthopnea for the past several days in the setting of noncompliance with her diuretic and an increase weight gain and peripheral edema. Patient is afebrile, moderately hypertensive.  Patient appears to be volume overloaded on exam.  EKG without acute ischemic changes or evidence of pericarditis.  Presentation is most consistent with volume overload from medication noncompliance. Will obtain screening labs and chest x-ray.  Pertinent labs & imaging results that were available during my care of the patient were reviewed by me and considered in my medical decision making:  Patient care turned over to oncoming provider. Patient case and results discussed in detail; please see their note for further ED managment.     Final Clinical Impression(s) / ED Diagnoses Final diagnoses:  Orthopnea     This chart was dictated using voice recognition software.  Despite best efforts to proofread,  errors can occur which can change the documentation meaning.    Fatima Blank, MD 07/12/21 [phone]

## 2021-07-12 NOTE — ED Provider Notes (Signed)
80 year old lady with past history of CAD, heart failure with reduced ejection fraction, presenting to ER with concern for orthopnea.  Also noted some peripheral edema and weight gain over the last few days.  Has not been taking her morning Lasix for the past week.  Suspect patient has degree of fluid overload.  Given dose of nitro.  Anticipate likely discharge.  7:00 AM received sign out from [place]  7:30 AM Evaluated patient, will provide dose of Lasix and reassess  9:07 AM symptoms much improved.  BNP is elevated.  Initial troponin is normal.  Suspect symptoms are related to mild exacerbation of her heart failure from missed Lasix dosing.  Ambulating in hall without difficulty. Vitals remain stable. Will check repeat trop. If wnl, will dc home with plan to continue her previously prescribed Lasix regimen and follow-up closely with cardiology and PCP.     Lucrezia Starch, MD 07/12/21 [phone]

## 2021-07-12 NOTE — ED Notes (Signed)
ED Provider at bedside. 

## 2021-07-12 NOTE — Discharge Instructions (Signed)
Please follow-up with your primary doctor and your cardiologist.  Please continue your Lasix as previously prescribed.  If you develop worsening difficulty breathing, any chest pain or other new concerning symptom, come back to ER for reassessment.

## 2021-07-12 NOTE — ED Triage Notes (Addendum)
Patient here POV from Home with SOB for approximately 24 Hours.  Patient states she has not been taking her Morning Medications for approximately 3-4 days. The patient has been having progressively worsening SOB for 1 day up until this AM she felt she could not lay down and breathe well.  BIB Wheelchair. A&Ox4. GCS 15. NAD Noted during Triage.

## 2021-07-28 ENCOUNTER — Other Ambulatory Visit: Payer: Self-pay | Admitting: Cardiovascular Disease

## 2021-07-28 MED ORDER — METOPROLOL TARTRATE 25 MG PO TABS: ORAL_TABLET | ORAL | 1 refills | Status: DC

## 2021-07-28 MED ORDER — FUROSEMIDE 20 MG PO TABS: ORAL_TABLET | ORAL | 1 refills | Status: DC

## 2021-07-28 MED ORDER — APIXABAN 5 MG PO TABS: ORAL_TABLET | ORAL | 1 refills | Status: DC

## 2021-07-28 NOTE — Telephone Encounter (Signed)
Furosemide & metoprolol tartrate refilled Eliquis request routed to [facility]

## 2021-07-28 NOTE — Telephone Encounter (Signed)
Prescription refill request for Eliquis received. Indication: Afib Last office visit:02/23/21 (O'Ne[place]) Scr: 1.07 (07/12/21) Age: 80 Weight: 80.9kg  Appropriate dose and refill sent to requested pharmacy.

## 2021-07-28 NOTE — Telephone Encounter (Signed)
*  STAT* If patient is at the pharmacy, call can be transferred to refill team.   1. Which medications need to be refilled? (please list name of each medication and dose if known) New prescriptions for her Eliquis, Furosemide and Metoprolol  2. Which pharmacy/location (including street and city if local pharmacy) is medication to be sent to?  [facility]  3. Do they need a 30 day or 90 day supply? 90 days and refills

## 2021-08-15 ENCOUNTER — Other Ambulatory Visit: Payer: Self-pay | Admitting: Cardiovascular Disease

## 2021-08-27 NOTE — Progress Notes (Signed)
Cardiology Office Note:   Date:  08/28/2021  NAME:  Judy [name]    MRN: [id number] DOB:  1941-08-30   PCP:  Michael Boston, MD  Cardiologist:  Evalina Field, MD  Electrophysiologist:  None   Referring MD: Michael Boston, MD   Chief Complaint  Patient presents with   Follow-up     History of Present Illness:   Judy [name] is a 80 y.o. female with a hx of CAD, HFpEF, diabetes, stroke who presents for follow-up.  Recently seen in the emergency room in early August after missing her water pills.  Had elevated BNP and concerns for pulmonary edema.  Restarted on Lasix and seems to be doing well.  She reports she can get short of breath but overall symptoms are stable.  She is doing water aerobics 3 times per week.  Up to 1 hour sessions.  No significant limitations.  This is actually better on her knees.  BP 132/60.  Pulse 68.  Pulse is regular.  She reports that her pharmacist through her primary care office is monitoring her blood pressure as well as heart rate.  There are some reports of irregular heartbeats.  Her pulse is regular today on examination.  She has continued on metoprolol tartrate 12.5 mg twice daily.  She is on Eliquis with no major issues.  There are some memory troubles.  She presents with a member from her church.  Apparently the pharmacist is now packing medications to take on a daily basis.  This is helping.  Denies any major symptoms in office.  Seems to be doing well.  Problem List 1. CAD/NSTEMI s/p CABG -02/2020 with NSTEMI and had 3v CAD -03/03/2020: CABG x 5; LIMA-LAD, SVG to OM2, OM3, D1, PLV (OM3 jump graft off D1) 2. HFpEF 3. HTN 4. DM -A1c 7.5 -T chol 127, HDL 49, LDL 61, triglycerides 86 5. CVA -post cath complication  6. OSA 7. Afib with RVR -CHADSVASC= 8 (age, female, CVA, MI, DM, HTN) -developed after CABG 03/11/2020 -spontaneously converted back to NSR 8. Carotid Artery stenosis -1-39% bilatearlly   Past Medical History: Past Medical History:   Diagnosis Date   Anxiety    Back pain    Complication of anesthesia 2003   gallbladder surgery-resp. arrest-stop operation and pt. went to ICU - patient stated she has back surgery since then and had no trouble at all   Coronary artery disease    Depression    Diabetes ([place])    Dyspnea    Gout    Heart attack [facility])    March 2021   History of kidney stones    Hyperlipidemia    Hypertension    Leg edema    Pancreatitis, chronic ([place]) 03/2016   Pneumonia due to COVID-19 virus 12/03/2019   Sleep apnea    Stroke [facility])    March 2021    Past Surgical History: Past Surgical History:  Procedure Laterality Date   BACK SURGERY     BREAST LUMPECTOMY WITH RADIOACTIVE SEED LOCALIZATION Right 10/23/2019   Procedure: RIGHT BREAST LUMPECTOMY WITH RADIOACTIVE SEED LOCALIZATION;  Surgeon: Jovita Kussmaul, MD;  Location: [place];  Service: General;  Laterality: Right;   CARDIAC CATHETERIZATION     CARDIAC SURGERY     CHOLECYSTECTOMY     COLONOSCOPY  2013   CORONARY ARTERY BYPASS GRAFT N/A 03/03/2020   Procedure: CORONARY ARTERY BYPASS GRAFTING (CABG), ON PUMP, TIMES FIVE, USING LEFT INTERNAL MAMMARY ARTERY AND ENDOSCOPICALLY HARVESTED  BILATERAL GREATER SAPHENOUS VEINS -flow track only;  Surgeon: Lajuana Matte, MD;  Location: [place];  Service: Open Heart Surgery;  Laterality: N/A;   LEFT HEART CATH AND CORONARY ANGIOGRAPHY N/A 03/01/2020   Procedure: LEFT HEART CATH AND CORONARY ANGIOGRAPHY;  Surgeon: Belva Crome, MD;  Location: [place] CV LAB;  Service: Cardiovascular;  Laterality: N/A;   TEE WITHOUT CARDIOVERSION N/A 03/03/2020   Procedure: TRANSESOPHAGEAL ECHOCARDIOGRAM (TEE);  Surgeon: Lajuana Matte, MD;  Location: [place];  Service: Open Heart Surgery;  Laterality: N/A;    Current Medications: No outpatient medications have been marked as taking for the 08/28/21 encounter (Office Visit) with Geralynn Rile, MD.     Allergies:    Patient has no known allergies.    Social History: Social History   Socioeconomic History   Marital status: Divorced    Spouse name: Not on file   Number of children: 1   Years of education: 12   Highest education level: Some college, no degree  Occupational History   Occupation: CNA  Tobacco Use   Smoking status: Never   Smokeless tobacco: Never  Vaping Use   Vaping Use: Never used  Substance and Sexual Activity   Alcohol use: No    Alcohol/week: 0.0 standard drinks   Drug use: No   Sexual activity: Not Currently  Other Topics Concern   Not on file  Social History Narrative   Lives with Roommate   Drinks 32 ounces of caffeine daily   Right Handed   Social Determinants of Health   Financial Resource Strain: Not on file  Food Insecurity: Not on file  Transportation Needs: Not on file  Physical Activity: Not on file  Stress: Not on file  Social Connections: Not on file     Family History: The patient's family history includes AAA (abdominal aortic aneurysm) in her father; Bladder Cancer in her brother; Breast cancer in her maternal aunt; Dementia in her mother. There is no history of Colon cancer, Stomach cancer, Esophageal cancer, or Pancreatic cancer.  ROS:   All other ROS reviewed and negative. Pertinent positives noted in the HPI.     EKGs/Labs/Other Studies Reviewed:   The following studies were personally reviewed by me today:  TTE 02/29/2020  1. Left ventricular ejection fraction, by estimation, is 45 to 50%. The  left ventricle has mildly decreased function. The left ventricle  demonstrates regional wall motion abnormalities (see scoring  diagram/findings for description). Inferior  hypokinesis. There is mild left ventricular hypertrophy. Left ventricular  diastolic parameters are indeterminate.   2. Right ventricular systolic function is normal. The right ventricular  size is normal.   3. The mitral valve is normal in structure. Trivial mitral valve  regurgitation.   4. The aortic  valve was not well visualized. Aortic valve regurgitation  is not visualized. No aortic stenosis is present.    Recent Labs: 07/12/2021: ALT 12; B Natriuretic Peptide 313.6; BUN 16; Creatinine, Ser 1.07; Hemoglobin 14.9; Magnesium 1.8; Platelets 194; Potassium 4.2; Sodium 141   Recent Lipid Panel    Component Value Date/Time   CHOL 89 03/01/2020 0351   CHOL 184 06/19/2018 0958   TRIG 101 03/01/2020 0351   HDL 39 (L) 03/01/2020 0351   HDL 45 06/19/2018 0958   CHOLHDL 2.3 03/01/2020 0351   VLDL 20 03/01/2020 0351   LDLCALC 30 03/01/2020 0351   LDLCALC 107 (H) 06/19/2018 0958    Physical Exam:   VS:  BP 132/60  Pulse 68   Ht 5' (1.524 m)   Wt 178 lb (80.7 kg)   SpO2 97%   BMI 34.76 kg/m    Wt Readings from Last 3 Encounters:  08/28/21 178 lb (80.7 kg)  07/12/21 178 lb 5.6 oz (80.9 kg)  02/23/21 178 lb 6.4 oz (80.9 kg)    General: Well nourished, well developed, in no acute distress Head: Atraumatic, normal size  Eyes: PEERLA, EOMI  Neck: Supple, no JVD Endocrine: No thryomegaly Cardiac: Normal S1, S2; RRR; no murmurs, rubs, or gallops Lungs: Clear to auscultation bilaterally, no wheezing, rhonchi or rales  Abd: Soft, nontender, no hepatomegaly  Ext: No edema, pulses 2+ Musculoskeletal: No deformities, BUE and BLE strength normal and equal Skin: Warm and dry, no rashes   Neuro: Alert and oriented to person, place, time, and situation, CNII-XII grossly intact, no focal deficits  Psych: Normal mood and affect   ASSESSMENT:   Judy [name] is a 80 y.o. female who presents for the following: 1. Coronary artery disease involving coronary bypass graft of native heart without angina pectoris   2. Mixed hyperlipidemia   3. Essential hypertension   4. Chronic diastolic heart failure ([place])   5. PAF (paroxysmal atrial fibrillation) ([place])   6. Adequate anticoagulation on anticoagulant therapy     PLAN:   1. Coronary artery disease involving coronary bypass graft of native  heart without angina pectoris 2. Mixed hyperlipidemia -She suffered non-STEMI in March 2021.  Underwent 5 vessel CABG.  Seems to be doing well.  No symptoms of angina.  She is on aspirin.  She is on a statin.  Most recent LDL cholesterol 61 which is at goal.  3. Essential hypertension -BP well controlled.  Continue metoprolol tartrate 12.5 mg twice daily, losartan 25 mg daily.  4. Chronic diastolic heart failure ([place]) -Recent visit to the ER after missing Lasix dose.  She will continue with Lasix 20 mg daily.  No evidence of congestive heart failure today.  Seems to be euvolemic on exam.  5. PAF (paroxysmal atrial fibrillation) ([place]) -She suffered A. fib shortly after bypass surgery.  No further recurrence.  Maintaining sinus rhythm on metoprolol.  She is on Eliquis 5 mg twice daily.  6. Adequate anticoagulation on anticoagulant therapy -On Eliquis.  No change.  Disposition: Return in about 6 months (around 02/25/2022).  Medication Adjustments/Labs and Tests Ordered: Current medicines are reviewed at length with the patient today.  Concerns regarding medicines are outlined above.  No orders of the defined types were placed in this encounter.  No orders of the defined types were placed in this encounter.   Patient Instructions  Medication Instructions:  No Changes In Medications at this time.  *If you need a refill on your cardiac medications before your next appointment, please call your pharmacy*  Follow-Up: At [facility], you and your health needs are our priority.  As part of our continuing mission to provide you with exceptional heart care, we have created designated Provider Care Teams.  These Care Teams include your primary Cardiologist (physician) and Advanced Practice Providers (APPs -  Physician Assistants and Nurse Practitioners) who all work together to provide you with the care you need, when you need it.  Your next appointment:   6 month(s)  The format for your  next appointment:   In Person  Provider:   You may see Evalina Field, MD or one of the following Advanced Practice Providers on your designated Care Team:  Almyra Deforest, PA-C Fabian Sharp, PA-C or  Roby Lofts, PA-C   Time Spent with Patient: I have spent a total of 25 minutes with patient reviewing hospital notes, telemetry, EKGs, labs and examining the patient as well as establishing an assessment and plan that was discussed with the patient.  > 50% of time was spent in direct patient care.  Signed, Addison Naegeli. Audie Box, MD, [place]  [address] [place], [place] [address] [phone]  08/28/2021 3:27 PM

## 2021-08-28 ENCOUNTER — Ambulatory Visit: Payer: Medicare Other | Admitting: Cardiovascular Disease

## 2021-08-28 ENCOUNTER — Other Ambulatory Visit: Payer: Self-pay

## 2021-08-28 ENCOUNTER — Encounter: Payer: Self-pay | Admitting: Cardiovascular Disease

## 2021-08-28 VITALS — BP 132/60 | HR 68 | Ht 60.0 in | Wt 178.0 lb

## 2021-08-28 DIAGNOSIS — E782 Mixed hyperlipidemia: Secondary | ICD-10-CM | POA: Diagnosis not present

## 2021-08-28 DIAGNOSIS — Z7901 Long term (current) use of anticoagulants: Secondary | ICD-10-CM

## 2021-08-28 DIAGNOSIS — I25810 Atherosclerosis of coronary artery bypass graft(s) without angina pectoris: Secondary | ICD-10-CM | POA: Diagnosis not present

## 2021-08-28 DIAGNOSIS — I480 Paroxysmal atrial fibrillation: Secondary | ICD-10-CM

## 2021-08-28 DIAGNOSIS — I5032 Chronic diastolic (congestive) heart failure: Secondary | ICD-10-CM

## 2021-08-28 DIAGNOSIS — I10 Essential (primary) hypertension: Secondary | ICD-10-CM | POA: Diagnosis not present

## 2021-08-28 NOTE — Patient Instructions (Signed)
Medication Instructions:  No Changes In Medications at this time.  *If you need a refill on your cardiac medications before your next appointment, please call your pharmacy*  Follow-Up: At [facility], you and your health needs are our priority.  As part of our continuing mission to provide you with exceptional heart care, we have created designated Provider Care Teams.  These Care Teams include your primary Cardiologist (physician) and Advanced Practice Providers (APPs -  Physician Assistants and Nurse Practitioners) who all work together to provide you with the care you need, when you need it.  Your next appointment:   6 month(s)  The format for your next appointment:   In Person  Provider:   You may see Evalina Field, MD or one of the following Advanced Practice Providers on your designated Care Team:   Almyra Deforest, PA-C Fabian Sharp, PA-C or  Roby Lofts, [place]

## 2021-09-25 ENCOUNTER — Other Ambulatory Visit: Payer: Self-pay

## 2021-09-25 ENCOUNTER — Encounter (HOSPITAL_BASED_OUTPATIENT_CLINIC_OR_DEPARTMENT_OTHER): Payer: Self-pay

## 2021-09-25 DIAGNOSIS — S301XXA Contusion of abdominal wall, initial encounter: Secondary | ICD-10-CM | POA: Insufficient documentation

## 2021-09-25 DIAGNOSIS — I119 Hypertensive heart disease without heart failure: Secondary | ICD-10-CM | POA: Insufficient documentation

## 2021-09-25 DIAGNOSIS — Z9011 Acquired absence of right breast and nipple: Secondary | ICD-10-CM | POA: Insufficient documentation

## 2021-09-25 DIAGNOSIS — Z7901 Long term (current) use of anticoagulants: Secondary | ICD-10-CM | POA: Diagnosis not present

## 2021-09-25 DIAGNOSIS — S161XXA Strain of muscle, fascia and tendon at neck level, initial encounter: Secondary | ICD-10-CM | POA: Diagnosis not present

## 2021-09-25 DIAGNOSIS — I2510 Atherosclerotic heart disease of native coronary artery without angina pectoris: Secondary | ICD-10-CM | POA: Diagnosis not present

## 2021-09-25 DIAGNOSIS — W548XXA Other contact with dog, initial encounter: Secondary | ICD-10-CM | POA: Insufficient documentation

## 2021-09-25 DIAGNOSIS — S0083XA Contusion of other part of head, initial encounter: Secondary | ICD-10-CM | POA: Insufficient documentation

## 2021-09-25 DIAGNOSIS — Z951 Presence of aortocoronary bypass graft: Secondary | ICD-10-CM | POA: Insufficient documentation

## 2021-09-25 DIAGNOSIS — Z8616 Personal history of COVID-19: Secondary | ICD-10-CM | POA: Diagnosis not present

## 2021-09-25 DIAGNOSIS — W19XXXA Unspecified fall, initial encounter: Secondary | ICD-10-CM | POA: Insufficient documentation

## 2021-09-25 DIAGNOSIS — Z7982 Long term (current) use of aspirin: Secondary | ICD-10-CM | POA: Diagnosis not present

## 2021-09-25 DIAGNOSIS — Z7984 Long term (current) use of oral hypoglycemic drugs: Secondary | ICD-10-CM | POA: Diagnosis not present

## 2021-09-25 DIAGNOSIS — Z79899 Other long term (current) drug therapy: Secondary | ICD-10-CM | POA: Insufficient documentation

## 2021-09-25 DIAGNOSIS — S0993XA Unspecified injury of face, initial encounter: Secondary | ICD-10-CM | POA: Diagnosis present

## 2021-09-25 NOTE — ED Triage Notes (Signed)
Pt came in  following a fall. States she was trying to pet her dog and the gate that they put In between their doors got lose and she fell forward.   C/o neck pain , bump on forehead. Pt takes bld thinners - denies LOC /  nausea.   Pt came in ambulatory.

## 2021-09-26 ENCOUNTER — Emergency Department (HOSPITAL_BASED_OUTPATIENT_CLINIC_OR_DEPARTMENT_OTHER)
Admission: EM | Admit: 2021-09-26 | Discharge: 2021-09-26 | Disposition: A | Discharge status: Home / Self Care | Payer: Medicare Other | Attending: Emergency Medicine | Admitting: Emergency Medicine

## 2021-09-26 ENCOUNTER — Emergency Department (HOSPITAL_BASED_OUTPATIENT_CLINIC_OR_DEPARTMENT_OTHER): Payer: Medicare Other

## 2021-09-26 DIAGNOSIS — S0083XA Contusion of other part of head, initial encounter: Secondary | ICD-10-CM

## 2021-09-26 DIAGNOSIS — W548XXA Other contact with dog, initial encounter: Secondary | ICD-10-CM

## 2021-09-26 DIAGNOSIS — S161XXA Strain of muscle, fascia and tendon at neck level, initial encounter: Secondary | ICD-10-CM

## 2021-09-26 DIAGNOSIS — S301XXA Contusion of abdominal wall, initial encounter: Secondary | ICD-10-CM

## 2021-09-26 DIAGNOSIS — W19XXXA Unspecified fall, initial encounter: Secondary | ICD-10-CM

## 2021-09-26 NOTE — ED Provider Notes (Signed)
[facility] Provider Note: Georgena Spurling, MD, FACEP  CSN: [id number] MRN: [id number] ARRIVAL: 09/25/21 at 2151 ROOM: [place]  Fall   HISTORY OF PRESENT ILLNESS  09/26/21 2:49 AM Judy [name] is a 80 y.o. female who is on Eliquis.  About 4 PM she was trying to pet her dog and she fell over a gate.  She struck her head and her epigastrium in the fall and is now having pain in her epigastrium as well as a hematoma to her right forehead.  She is also having pain in her neck.  She rates the pain in her neck is a 5 out of 10, worse with movement.  She did not lose consciousness.  She is not nauseated nor has she vomited.   Past Medical History:  Diagnosis Date   Anxiety    Back pain    Complication of anesthesia 2003   gallbladder surgery-resp. arrest-stop operation and pt. went to ICU - patient stated she has back surgery since then and had no trouble at all   Coronary artery disease    Depression    Diabetes ([place])    Dyspnea    Gout    Heart attack [facility])    March 2021   History of kidney stones    Hyperlipidemia    Hypertension    Leg edema    Pancreatitis, chronic ([place]) 03/2016   Pneumonia due to COVID-19 virus 12/03/2019   Sleep apnea    Stroke [facility])    March 2021    Past Surgical History:  Procedure Laterality Date   BACK SURGERY     BREAST LUMPECTOMY WITH RADIOACTIVE SEED LOCALIZATION Right 10/23/2019   Procedure: RIGHT BREAST LUMPECTOMY WITH RADIOACTIVE SEED LOCALIZATION;  Surgeon: Jovita Kussmaul, MD;  Location: [place];  Service: General;  Laterality: Right;   CARDIAC CATHETERIZATION     CARDIAC SURGERY     CHOLECYSTECTOMY     COLONOSCOPY  2013   CORONARY ARTERY BYPASS GRAFT N/A 03/03/2020   Procedure: CORONARY ARTERY BYPASS GRAFTING (CABG), ON PUMP, TIMES FIVE, USING LEFT INTERNAL MAMMARY ARTERY AND ENDOSCOPICALLY HARVESTED BILATERAL GREATER SAPHENOUS VEINS -flow track only;  Surgeon: Lajuana Matte, MD;  Location: [place];   Service: Open Heart Surgery;  Laterality: N/A;   LEFT HEART CATH AND CORONARY ANGIOGRAPHY N/A 03/01/2020   Procedure: LEFT HEART CATH AND CORONARY ANGIOGRAPHY;  Surgeon: Belva Crome, MD;  Location: [place] CV LAB;  Service: Cardiovascular;  Laterality: N/A;   TEE WITHOUT CARDIOVERSION N/A 03/03/2020   Procedure: TRANSESOPHAGEAL ECHOCARDIOGRAM (TEE);  Surgeon: Lajuana Matte, MD;  Location: [place];  Service: Open Heart Surgery;  Laterality: N/A;    Family History  Problem Relation Age of Onset   Dementia Mother    AAA (abdominal aortic aneurysm) Father    Breast cancer Maternal Aunt    Bladder Cancer Brother    Colon cancer Neg Hx    Stomach cancer Neg Hx    Esophageal cancer Neg Hx    Pancreatic cancer Neg Hx     Social History   Tobacco Use   Smoking status: Never   Smokeless tobacco: Never  Vaping Use   Vaping Use: Never used  Substance Use Topics   Alcohol use: No    Alcohol/week: 0.0 standard drinks   Drug use: No    Prior to Admission medications   Medication Sig Start Date End Date Taking? Authorizing Provider  Accu-Chek Softclix Lancets lancets USE TO  CHECK BLOOD SUGAR TWICE DAILY 05/11/21   Elsie Stain, MD  apixaban (ELIQUIS) 5 MG TABS tablet TAKE 1 TABLET(5 MG) BY MOUTH TWICE DAILY 07/28/21   O'Neal, Cassie Freer, MD  aspirin EC 81 MG tablet Take 1 tablet (81 mg total) by mouth daily. Swallow whole. 02/23/21   O'NealCassie Freer, MD  atorvastatin (LIPITOR) 20 MG tablet Take 1 tablet (20 mg total) by mouth at bedtime. 05/04/20   O'Neal, Cassie Freer, MD  Cyanocobalamin (B-12 COMPLIANCE INJECTION) 1000 MCG/ML KIT Inject 1,000 mcg as directed every 30 (thirty) days.    [provider]  DULoxetine (CYMBALTA) 60 MG capsule Take 60 mg by mouth daily.    [provider]  empagliflozin (JARDIANCE) 25 MG TABS tablet  02/02/20   [provider]  furosemide (LASIX) 20 MG tablet TAKE 1 TABLET(20 MG) BY MOUTH DAILY 07/28/21   O'Neal, Cassie Freer, MD  glucose blood (ACCU-CHEK AVIVA) test strip Test twice daily 04/04/20   Elsie Stain, MD  losartan (COZAAR) 25 MG tablet Take 1 tablet (25 mg total) by mouth daily. 04/04/20   Elsie Stain, MD  metFORMIN (GLUCOPHAGE-XR) 750 MG 24 hr tablet Take 750 mg by mouth daily with breakfast. 10/11/20   [provider]  methocarbamol (ROBAXIN) 500 MG tablet Take 500 mg by mouth every 8 (eight) hours as needed for muscle spasms.    [provider]  metoprolol tartrate (LOPRESSOR) 25 MG tablet TAKE 1/2 TABLET(12.5 MG) BY MOUTH TWICE DAILY 08/15/21   O'Neal, Cassie Freer, MD  NON FORMULARY 1 each by Other route See admin instructions. CPAP machine nightly    [provider]  traZODone (DESYREL) 100 MG tablet Take 150 mg by mouth at bedtime. 04/28/20   [provider]    Allergies Patient has no known allergies.   REVIEW OF SYSTEMS  Negative except as noted here or in the History of Present Illness.   PHYSICAL EXAMINATION  Initial Vital Signs Blood pressure (!) 173/68, pulse 64, temperature 98.7 F (37.1 C), temperature source Oral, resp. rate 19, height 5' (1.524 m), weight 79.4 kg, SpO2 96 %.  Examination General: Well-developed, well-nourished female in no acute distress; appearance consistent with age of record HENT: normocephalic; hematoma of right forehead Eyes: pupils equal, round and reactive to light; extraocular muscles intact Neck: supple; soft tissue tenderness Heart: regular rate and rhythm Lungs: clear to auscultation bilaterally Abdomen: soft; nondistended; epigastric tenderness without ecchymosis; bowel sounds present Extremities: No deformity; full range of motion; trace edema of lower legs Neurologic: Awake, alert and oriented; motor function intact in all extremities and symmetric; no facial droop Skin: Warm and dry Psychiatric: Normal mood and affect   RESULTS  Summary of this visit's results, reviewed and interpreted by  myself:   EKG Interpretation  Date/Time:    Ventricular Rate:    PR Interval:    QRS Duration:   QT Interval:    QTC Calculation:   R Axis:     Text Interpretation:         Laboratory Studies: No results found for this or any previous visit (from the past 24 hour(s)). Imaging Studies: CT Head Wo Contrast  Result Date: 09/26/2021 CLINICAL DATA:  Status post fall. EXAM: CT HEAD WITHOUT CONTRAST TECHNIQUE: Contiguous axial images were obtained from the base of the skull through the vertex without intravenous contrast. COMPARISON:  June 20, 2020 FINDINGS: Brain: There is mild cerebral atrophy with widening of the extra-axial spaces and ventricular  dilatation. There are areas of decreased attenuation within the white matter tracts of the supratentorial brain, consistent with microvascular disease changes. Vascular: No hyperdense vessel or unexpected calcification. Skull: Normal. Negative for fracture or focal lesion. Sinuses/Orbits: No acute finding. Other: There is mild right frontal scalp soft tissue swelling. IMPRESSION: Mild right frontal scalp soft tissue swelling without evidence of an acute fracture or acute intracranial abnormality. Electronically Signed   By: Virgina Norfolk M.D.   On: 09/26/2021 01:05   CT Cervical Spine Wo Contrast  Result Date: 09/26/2021 CLINICAL DATA:  Status post fall. EXAM: CT CERVICAL SPINE WITHOUT CONTRAST TECHNIQUE: Multidetector CT imaging of the cervical spine was performed without intravenous contrast. Multiplanar CT image reconstructions were also generated. COMPARISON:  June 20, 2020 FINDINGS: Alignment: Normal. Skull base and vertebrae: No acute fracture. No primary bone lesion or focal pathologic process. Soft tissues and spinal canal: No prevertebral fluid or swelling. No visible canal hematoma. Disc levels: Moderate to marked severity endplate sclerosis and osteophyte formation is seen at the levels of C4-C5, C5-C6 and C6-C7. Moderate severity  anterior osteophyte formation is also noted at the level of C3-C4. Moderate to marked severity intervertebral disc space narrowing is seen at the levels of C4-C5, C5-C6 and C6-C7. Mild to moderate severity bilateral multilevel facet joint hypertrophy is noted. Upper chest: A chronic fracture of the first left rib is seen. Other: None. IMPRESSION: 1. Moderate to marked severity degenerative changes, as described above. 2. No evidence of an acute fracture within the cervical spine. 3. Chronic fracture of the first left rib. Electronically Signed   By: Virgina Norfolk M.D.   On: 09/26/2021 01:07    ED COURSE and MDM  Nursing notes, initial and subsequent vitals signs, including pulse oximetry, reviewed and interpreted by myself.  Vitals:   09/25/21 2204 09/26/21 0243  BP: (!) 158/77 (!) 173/68  Pulse: 83 64  Resp: 20 19  Temp: 98.7 F (37.1 C)   TempSrc: Oral   SpO2: 96% 96%  Weight: 79.4 kg   Height: 5' (1.524 m)    Medications - No data to display  No evidence of significant injury on CT scans.  The epigastric tenderness is likely due to an abdominal wall contusion although no ecchymosis was seen.  She does not have tenderness over the liver or the spleen.  She will be given instructions on head injuries and reasons to return.  PROCEDURES  Procedures   ED DIAGNOSES     ICD-10-CM   1. Fall, initial encounter  W19.XXXA     2. Traumatic hematoma of forehead, initial encounter  S00.83XA     3. Contusion of abdominal wall, initial encounter  S30.1XXA     4. Cervical strain, acute, initial encounter  S16.1XXA     5. Other contact with dog, initial encounter  W54.8XXA          Maira Christon, Jenny Reichmann, MD 09/26/21 0300

## 2021-11-14 ENCOUNTER — Other Ambulatory Visit: Payer: Self-pay | Admitting: Internal Medicine

## 2021-11-14 DIAGNOSIS — Z1231 Encounter for screening mammogram for malignant neoplasm of breast: Secondary | ICD-10-CM

## 2021-12-08 ENCOUNTER — Ambulatory Visit (INDEPENDENT_AMBULATORY_CARE_PROVIDER_SITE_OTHER): Payer: Medicare Other

## 2021-12-08 ENCOUNTER — Ambulatory Visit (HOSPITAL_COMMUNITY)
Admission: EM | Admit: 2021-12-08 | Discharge: 2021-12-08 | Disposition: A | Discharge status: Home / Self Care | Payer: Medicare Other | Attending: Internal Medicine | Admitting: Internal Medicine

## 2021-12-08 ENCOUNTER — Ambulatory Visit (HOSPITAL_COMMUNITY): Admit: 2021-12-08 | Payer: Medicare Other

## 2021-12-08 ENCOUNTER — Other Ambulatory Visit: Payer: Self-pay

## 2021-12-08 DIAGNOSIS — M25512 Pain in left shoulder: Secondary | ICD-10-CM

## 2021-12-08 DIAGNOSIS — W19XXXA Unspecified fall, initial encounter: Secondary | ICD-10-CM

## 2021-12-08 DIAGNOSIS — S43402A Unspecified sprain of left shoulder joint, initial encounter: Secondary | ICD-10-CM

## 2021-12-08 MED ORDER — PREDNISONE 20 MG PO TABS: 20.0000 mg | ORAL_TABLET | Freq: Every day | ORAL | 0 refills | Status: AC

## 2021-12-08 NOTE — Discharge Instructions (Signed)
Gentle range of motion exercises Heating pad use only 20 minutes on-20 minutes off cycle Continue taking tramadol as needed Take short course of steroids X-ray is negative for fracture Follow-up with your primary care physician if pain does not improve.

## 2021-12-08 NOTE — ED Triage Notes (Signed)
Pt presents with left side neck pain, left shoulder and left arm pain after a fall X 4 nights ago outside.

## 2021-12-11 NOTE — ED Provider Notes (Signed)
[facility]    CSN: [id number] Arrival date & time: 12/08/21  1721      History   Chief Complaint Chief Complaint  Patient presents with   Fall    HPI Judy [name] is a 81 y.o. female is brought to the urgent care accompanied by his daughter on account of left shoulder and left-sided neck pain of 4 days duration.  Patient lost her balance and fell about 4 days ago.  Following that patient experienced sharp left shoulder pain.  Pain is currently of moderate severity.  Pain is aggravated by movement.  No known relieving factors.  Is associated with some muscle stiffness on the left side of the neck and the left shoulder region.  No significant bruising over the left shoulder or upper arm.  Patient denies hitting her head.  No headache, blurry vision, confusion, nausea, vomiting.   HPI  Past Medical History:  Diagnosis Date   Anxiety    Back pain    Complication of anesthesia 2003   gallbladder surgery-resp. arrest-stop operation and pt. went to ICU - patient stated she has back surgery since then and had no trouble at all   Coronary artery disease    Depression    Diabetes ([place])    Dyspnea    Gout    Heart attack [facility])    March 2021   History of kidney stones    Hyperlipidemia    Hypertension    Leg edema    Pancreatitis, chronic ([place]) 03/2016   Pneumonia due to COVID-19 virus 12/03/2019   Sleep apnea    Stroke [facility])    March 2021    Patient Active Problem List   Diagnosis Date Noted   Memory loss 08/22/2020   Dementia with behavioral disturbance 08/22/2020   Coronary artery disease 05/17/2020   Pleural effusion 04/12/2020   Atrial fibrillation ([place]) 03/11/2020   S/P CABG (coronary artery bypass graft) 03/03/2020   Cerebral thrombosis with cerebral infarction 03/02/2020   Hyperlipidemia associated with type 2 diabetes mellitus ([place]) 02/09/2020   Type 2 diabetes mellitus, controlled ([place]) 01/12/2020   Class 2 severe obesity with serious comorbidity  and body mass index (BMI) of 35.0 to 35.9 in adult, unspecified obesity type ([place]) 12/25/2018   Obstructive sleep apnea 11/04/2017   Lumbar stenosis with neurogenic claudication 11/19/2016   HTN (hypertension) 03/21/2016   Anxiety    Depression     Past Surgical History:  Procedure Laterality Date   BACK SURGERY     BREAST LUMPECTOMY WITH RADIOACTIVE SEED LOCALIZATION Right 10/23/2019   Procedure: RIGHT BREAST LUMPECTOMY WITH RADIOACTIVE SEED LOCALIZATION;  Surgeon: Jovita Kussmaul, MD;  Location: [place];  Service: General;  Laterality: Right;   CARDIAC CATHETERIZATION     CARDIAC SURGERY     CHOLECYSTECTOMY     COLONOSCOPY  2013   CORONARY ARTERY BYPASS GRAFT N/A 03/03/2020   Procedure: CORONARY ARTERY BYPASS GRAFTING (CABG), ON PUMP, TIMES FIVE, USING LEFT INTERNAL MAMMARY ARTERY AND ENDOSCOPICALLY HARVESTED BILATERAL GREATER SAPHENOUS VEINS -flow track only;  Surgeon: Lajuana Matte, MD;  Location: [place];  Service: Open Heart Surgery;  Laterality: N/A;   LEFT HEART CATH AND CORONARY ANGIOGRAPHY N/A 03/01/2020   Procedure: LEFT HEART CATH AND CORONARY ANGIOGRAPHY;  Surgeon: Belva Crome, MD;  Location: [place] CV [facility];  Service: Cardiovascular;  Laterality: N/A;   TEE WITHOUT CARDIOVERSION N/A 03/03/2020   Procedure: TRANSESOPHAGEAL ECHOCARDIOGRAM (TEE);  Surgeon: Lajuana Matte, MD;  Location: [facility]  OR;  Service: Open Heart Surgery;  Laterality: N/A;    OB History     Gravida  1   Para      Term      Preterm      AB      Living  1      SAB      IAB      Ectopic      Multiple      Live Births               Home Medications    Prior to Admission medications   Medication Sig Start Date End Date Taking? Authorizing Provider  predniSONE (DELTASONE) 20 MG tablet Take 1 tablet (20 mg total) by mouth daily for 5 days. 12/08/21 12/13/21 Yes Tamerra Merkley, Myrene Galas, MD  Accu-Chek Softclix Lancets lancets USE TO CHECK BLOOD SUGAR TWICE DAILY 05/11/21   Elsie Stain, MD  apixaban (ELIQUIS) 5 MG TABS tablet TAKE 1 TABLET(5 MG) BY MOUTH TWICE DAILY 07/28/21   O'Neal, Cassie Freer, MD  aspirin EC 81 MG tablet Take 1 tablet (81 mg total) by mouth daily. Swallow whole. 02/23/21   O'NealCassie Freer, MD  atorvastatin (LIPITOR) 20 MG tablet Take 1 tablet (20 mg total) by mouth at bedtime. 05/04/20   O'Neal, Cassie Freer, MD  Cyanocobalamin (B-12 COMPLIANCE INJECTION) 1000 MCG/ML KIT Inject 1,000 mcg as directed every 30 (thirty) days.    [provider]  DULoxetine (CYMBALTA) 60 MG capsule Take 60 mg by mouth daily.    [provider]  empagliflozin (JARDIANCE) 25 MG TABS tablet  02/02/20   [provider]  furosemide (LASIX) 20 MG tablet TAKE 1 TABLET(20 MG) BY MOUTH DAILY 07/28/21   O'Neal, Cassie Freer, MD  glucose blood (ACCU-CHEK AVIVA) test strip Test twice daily 04/04/20   Elsie Stain, MD  losartan (COZAAR) 25 MG tablet Take 1 tablet (25 mg total) by mouth daily. 04/04/20   Elsie Stain, MD  metFORMIN (GLUCOPHAGE-XR) 750 MG 24 hr tablet Take 750 mg by mouth daily with breakfast. 10/11/20   [provider]  methocarbamol (ROBAXIN) 500 MG tablet Take 500 mg by mouth every 8 (eight) hours as needed for muscle spasms.    [provider]  metoprolol tartrate (LOPRESSOR) 25 MG tablet TAKE 1/2 TABLET(12.5 MG) BY MOUTH TWICE DAILY 08/15/21   O'Neal, Cassie Freer, MD  NON FORMULARY 1 each by Other route See admin instructions. CPAP machine nightly    [provider]  traZODone (DESYREL) 100 MG tablet Take 150 mg by mouth at bedtime. 04/28/20   [provider]    Family History Family History  Problem Relation Age of Onset   Dementia Mother    AAA (abdominal aortic aneurysm) Father    Breast cancer Maternal Aunt    Bladder Cancer Brother    Colon cancer Neg Hx    Stomach cancer Neg Hx    Esophageal cancer Neg Hx    Pancreatic cancer Neg Hx     Social History Social History    Tobacco Use   Smoking status: Never   Smokeless tobacco: Never  Vaping Use   Vaping Use: Never used  Substance Use Topics   Alcohol use: No    Alcohol/week: 0.0 standard drinks   Drug use: No     Allergies   Patient has no known allergies.   Review of Systems Review of Systems  Constitutional: Negative.   HENT: Negative.  Cardiovascular:  Negative for chest pain.  Musculoskeletal:  Positive for arthralgias, back pain and myalgias. Negative for gait problem and joint swelling.  Neurological:  Negative for dizziness, weakness, light-headedness and headaches.  Psychiatric/Behavioral: Negative.      Physical Exam Triage Vital Signs ED Triage Vitals  Enc Vitals Group     BP 12/08/21 1803 (!) 151/77     Pulse Rate 12/08/21 1803 73     Resp 12/08/21 1803 17     Temp 12/08/21 1803 98.1 F (36.7 C)     Temp Source 12/08/21 1803 Oral     SpO2 12/08/21 1803 95 %     Weight 12/08/21 1801 178 lb 9.6 oz (81 kg)     Height --      Head Circumference --      Peak Flow --      Pain Score 12/08/21 1806 8     Pain Loc --      Pain Edu? --      Excl. in [place]? --    No data found.  Updated Vital Signs BP (!) 151/77 (BP Location: Right Arm)    Pulse 73    Temp 98.1 F (36.7 C) (Oral)    Resp 17    Wt 81 kg    SpO2 95%    BMI 34.88 kg/m   Visual Acuity Right Eye Distance:   Left Eye Distance:   Bilateral Distance:    Right Eye Near:   Left Eye Near:    Bilateral Near:     Physical Exam Vitals and nursing note reviewed.  Constitutional:      General: She is in acute distress.     Appearance: She is not ill-appearing.  Cardiovascular:     Rate and Rhythm: Normal rate and regular rhythm.     Pulses: Normal pulses.     Heart sounds: Normal heart sounds.  Pulmonary:     Effort: Pulmonary effort is normal.     Breath sounds: Normal breath sounds.  Abdominal:     General: Bowel sounds are normal.     Palpations: Abdomen is soft.  Musculoskeletal:        General:  Tenderness present. No deformity or signs of injury. Normal range of motion.     Cervical back: Normal range of motion and neck supple. No rigidity.     Comments: Full range of motion of the left shoulder with pain.  Left trapezius muscle stiffness.  Full range of motion of the cervical spine.  Full flexion and extension of the neck.  Deep tendon reflexes are 2+ in both upper extremities.  Skin:    General: Skin is warm.     Findings: Bruising present.     Comments: Bruising over the right upper arm.  Bruise measures about 3 x 3 inches.  Bruises about 33 to 7 days old  Neurological:     Mental Status: She is alert.     UC Treatments / Results  Labs (all labs ordered are listed, but only abnormal results are displayed) Labs Reviewed - No data to display  EKG   Radiology No results found.  Procedures Procedures (including critical care time)  Medications Ordered in UC Medications - No data to display  Initial Impression / Assessment and Plan / UC Course  I have reviewed the triage vital signs and the nursing notes.  Pertinent labs & imaging results that were available during my care of the patient were reviewed by me and considered  in my medical decision making (see chart for details).     1.  Left shoulder sprain: Gentle range of motion exercises Heating pad use only 20 minutes on-20 minutes off cycle Short course of prednisone X-ray of the left shoulder is negative for acute fracture Patient may need to follow-up with orthopedic surgery if pain is persistent or worsens. Final Clinical Impressions(s) / UC Diagnoses   Final diagnoses:  Sprain of left shoulder, unspecified shoulder sprain type, initial encounter     Discharge Instructions      Gentle range of motion exercises Heating pad use only 20 minutes on-20 minutes off cycle Continue taking tramadol as needed Take short course of steroids X-ray is negative for fracture Follow-up with your primary care  physician if pain does not improve.   ED Prescriptions     Medication Sig Dispense Auth. Provider   predniSONE (DELTASONE) 20 MG tablet Take 1 tablet (20 mg total) by mouth daily for 5 days. 5 tablet Khayri Kargbo, Myrene Galas, MD      PDMP not reviewed this encounter.   Chase Picket, MD 12/11/21 [phone]

## 2021-12-20 ENCOUNTER — Ambulatory Visit
Admission: RE | Admit: 2021-12-20 | Discharge: 2021-12-20 | Disposition: A | Discharge status: Home / Self Care | Payer: Medicare Other | Source: Ambulatory Visit | Attending: Internal Medicine | Admitting: Internal Medicine

## 2021-12-20 DIAGNOSIS — Z1231 Encounter for screening mammogram for malignant neoplasm of breast: Secondary | ICD-10-CM

## 2022-01-10 ENCOUNTER — Other Ambulatory Visit: Payer: Self-pay | Admitting: Cardiovascular Disease

## 2022-01-10 NOTE — Telephone Encounter (Signed)
Prescription refill request for Eliquis received. Indication:Afib Last office visit:9/22 Scr:1.0 Age: 81 Weight:81 kg  Prescription refilled

## 2022-02-19 NOTE — Progress Notes (Signed)
?Cardiology Office Note:   ?Date:  02/22/2022  ?NAME:  Judy [name]    ?MRN: [id number] ?DOB:  12/14/1940  ? ?PCP:  Wile, Laura H, MD  ?Cardiologist:  Apollo T O'Neal, MD  ?Electrophysiologist:  None  ? ?Referring MD: Wile, Laura H, MD  ? ?Chief Complaint  ?Patient presents with  ? Follow-up  ?   ?  ? ?History of Present Illness:   ?Judy [name] is a 80 y.o. female with below history who presents for follow-up.  She apparently had an episode last night of rapid heartbeat sensation and chest pain.  Symptoms occurred at rest.  They were not exertional.  EMS evaluated her and everything looked okay.  She did not go to the emergency room.  Her EKG in office demonstrates sinus rhythm with PACs.  She also has an inferior infarct which is not new.  No acute ischemic changes.  No further symptoms.  She reports significant stress.  Apparently she was remodeling her house.  Suspect this could have contributed.  She also reports her blood sugars have been high.  She also reports falls as well as balance issues.  I have recommended she consider home health physical therapy again.  Blood pressure 110/54.  Weight is down 4 pounds since last visit.  No evidence of volume overload.  She does get short of breath with activity but she has not been doing any water aerobics in the last 2 weeks.  Strongly suspect this is related to inactivity.  Overall cholesterol levels are at goal.  Diabetes numbers not that bad with an A1c of 7.5.  Her exam is unremarkable.  She seems to be doing fairly stable.  Suspect her symptoms last night were due to stress. ? ?Problem List ?1. CAD/NSTEMI s/p CABG ?-02/2020 with NSTEMI and had 3v CAD ?-03/03/2020: CABG x 5; LIMA-LAD, SVG to OM2, OM3, D1, PLV (OM3 jump graft off D1) ?2. HFpEF ?3. HTN ?4. DM ?-A1c 7.5 ?-T chol 130, HDL 51, LDL 59, TG 100 ?5. CVA ?-post cath complication  ?6. OSA ?7. Afib with RVR ?-CHADSVASC= 8 (age, female, CVA, MI, DM, HTN) ?-developed after CABG 03/11/2020 ?-spontaneously  converted back to NSR ?8. Carotid Artery stenosis ?-1-39% bilatearlly  ? ?Past Medical History: ?Past Medical History:  ?Diagnosis Date  ? Anxiety   ? Back pain   ? Complication of anesthesia 2003  ? gallbladder surgery-resp. arrest-stop operation and pt. went to ICU - patient stated she has back surgery since then and had no trouble at all  ? Coronary artery disease   ? Depression   ? Diabetes ([facility])   ? Dyspnea   ? Gout   ? Heart attack ([facility])   ? March 2021  ? History of kidney stones   ? Hyperlipidemia   ? Hypertension   ? Leg edema   ? Pancreatitis, chronic ([facility]) 03/2016  ? Pneumonia due to COVID-19 virus 12/03/2019  ? Sleep apnea   ? Stroke ([facility])   ? March 2021  ? ? ?Past Surgical History: ?Past Surgical History:  ?Procedure Laterality Date  ? BACK SURGERY    ? BREAST LUMPECTOMY WITH RADIOACTIVE SEED LOCALIZATION Right 10/23/2019  ? Procedure: RIGHT BREAST LUMPECTOMY WITH RADIOACTIVE SEED LOCALIZATION;  Surgeon: Toth, Paul III, MD;  Location: [facility];  Service: General;  Laterality: Right;  ? CARDIAC CATHETERIZATION    ? CARDIAC SURGERY    ? CHOLECYSTECTOMY    ? COLONOSCOPY  2013  ? CORONARY ARTERY BYPASS GRAFT N/A   03/03/2020  ? Procedure: CORONARY ARTERY BYPASS GRAFTING (CABG), ON PUMP, TIMES FIVE, USING LEFT INTERNAL MAMMARY ARTERY AND ENDOSCOPICALLY HARVESTED BILATERAL GREATER SAPHENOUS VEINS -flow track only;  Surgeon: Lightfoot, Harrell O, MD;  Location: [facility] OR;  Service: Open Heart Surgery;  Laterality: N/A;  ? LEFT HEART CATH AND CORONARY ANGIOGRAPHY N/A 03/01/2020  ? Procedure: LEFT HEART CATH AND CORONARY ANGIOGRAPHY;  Surgeon: Smith, Henry W, MD;  Location: [facility];  Service: Cardiovascular;  Laterality: N/A;  ? TEE WITHOUT CARDIOVERSION N/A 03/03/2020  ? Procedure: TRANSESOPHAGEAL ECHOCARDIOGRAM (TEE);  Surgeon: Lightfoot, Harrell O, MD;  Location: [facility];  Service: Open Heart Surgery;  Laterality: N/A;  ? ? ?Current Medications: ?Current Meds  ?Medication Sig  ? Accu-Chek Softclix Lancets lancets  USE TO CHECK BLOOD SUGAR TWICE DAILY  ? atorvastatin (LIPITOR) 20 MG tablet Take 1 tablet (20 mg total) by mouth at bedtime.  ? Cyanocobalamin (B-12 COMPLIANCE INJECTION) 1000 MCG/ML KIT Inject 1,000 mcg as directed every 30 (thirty) days.  ? DULoxetine (CYMBALTA) 60 MG capsule Take 60 mg by mouth daily.  ? ELIQUIS 5 MG TABS tablet TAKE ONE TABLET BY MOUTH TWICE DAILY  ? empagliflozin (JARDIANCE) 25 MG TABS tablet   ? furosemide (LASIX) 20 MG tablet TAKE ONE TABLET BY MOUTH ONCE DAILY  ? glucose blood (ACCU-CHEK AVIVA) test strip Test twice daily  ? losartan (COZAAR) 25 MG tablet Take 1 tablet (25 mg total) by mouth daily.  ? metFORMIN (GLUCOPHAGE-XR) 750 MG 24 hr tablet Take 750 mg by mouth daily with breakfast.  ? methocarbamol (ROBAXIN) 500 MG tablet Take 500 mg by mouth every 8 (eight) hours as needed for muscle spasms.  ? metoprolol tartrate (LOPRESSOR) 25 MG tablet TAKE 1/2 TABLET(12.5 MG) BY MOUTH TWICE DAILY  ? NON FORMULARY 1 each by Other route See admin instructions. CPAP machine nightly  ? traMADol (ULTRAM) 50 MG tablet Take 50 mg by mouth every 8 (eight) hours as needed.  ? traZODone (DESYREL) 100 MG tablet Take 150 mg by mouth at bedtime.  ?  ? ?Allergies:    ?Patient has no known allergies.  ? ?Social History: ?Social History  ? ?Socioeconomic History  ? Marital status: Divorced  ?  Spouse name: Not on file  ? Number of children: 1  ? Years of education: 12  ? Highest education level: Some college, no degree  ?Occupational History  ? Occupation: CNA  ?Tobacco Use  ? Smoking status: Never  ? Smokeless tobacco: Never  ?Vaping Use  ? Vaping Use: Never used  ?Substance and Sexual Activity  ? Alcohol use: No  ?  Alcohol/week: 0.0 standard drinks  ? Drug use: No  ? Sexual activity: Not Currently  ?Other Topics Concern  ? Not on file  ?Social History Narrative  ? Lives with Roommate  ? Drinks 32 ounces of caffeine daily  ? Right Handed  ? ?Social Determinants of Health  ? ?Financial Resource Strain: Not on  file  ?Food Insecurity: Not on file  ?Transportation Needs: Not on file  ?Physical Activity: Not on file  ?Stress: Not on file  ?Social Connections: Not on file  ?  ? ?Family History: ?The patient's family history includes AAA (abdominal aortic aneurysm) in her father; Bladder Cancer in her brother; Breast cancer in her maternal aunt; Dementia in her mother. There is no history of Colon cancer, Stomach cancer, Esophageal cancer, or Pancreatic cancer. ? ?ROS:   ?All other ROS reviewed and negative. Pertinent positives noted in   the HPI.    ? ?EKGs/Labs/Other Studies Reviewed:   ?The following studies were personally reviewed by me today: ? ?EKG:  EKG is ordered today.  The ekg ordered today demonstrates sinus rhythm heart 73, PACs noted, no acute ischemic changes, inferior infarct noted, and was personally reviewed by me.  ? ?TTE 02/29/2020 ? 1. Left ventricular ejection fraction, by estimation, is 45 to 50%. The  ?left ventricle has mildly decreased function. The left ventricle  ?demonstrates regional wall motion abnormalities (see scoring  ?diagram/findings for description). Inferior  ?hypokinesis. There is mild left ventricular hypertrophy. Left ventricular  ?diastolic parameters are indeterminate.  ? 2. Right ventricular systolic function is normal. The right ventricular  ?size is normal.  ? 3. The mitral valve is normal in structure. Trivial mitral valve  ?regurgitation.  ? 4. The aortic valve was not well visualized. Aortic valve regurgitation  ?is not visualized. No aortic stenosis is present.  ? ?Recent Labs: ?07/12/2021: ALT 12; B Natriuretic Peptide 313.6; BUN 16; Creatinine, Ser 1.07; Hemoglobin 14.9; Magnesium 1.8; Platelets 194; Potassium 4.2; Sodium 141  ? ?Recent Lipid Panel ?   ?Component Value Date/Time  ? CHOL 89 03/01/2020 0351  ? CHOL 184 06/19/2018 0958  ? TRIG 101 03/01/2020 0351  ? HDL 39 (L) 03/01/2020 0351  ? HDL 45 06/19/2018 0958  ? CHOLHDL 2.3 03/01/2020 0351  ? VLDL 20 03/01/2020 0351  ?  LDLCALC 30 03/01/2020 0351  ? LDLCALC 107 (H) 06/19/2018 0958  ? ? ?Physical Exam:   ?VS:  BP (!) 110/54   Pulse 73   Ht 5' (1.524 m)   Wt 174 lb 12.8 oz (79.3 kg)   SpO2 95%   BMI 34.14 kg/m?    ?Wt Readings from

## 2022-02-22 ENCOUNTER — Ambulatory Visit: Payer: Medicare Other | Admitting: Cardiovascular Disease

## 2022-02-22 ENCOUNTER — Other Ambulatory Visit: Payer: Self-pay

## 2022-02-22 ENCOUNTER — Encounter: Payer: Self-pay | Admitting: Cardiovascular Disease

## 2022-02-22 VITALS — BP 110/54 | HR 73 | Ht 60.0 in | Wt 174.8 lb

## 2022-02-22 DIAGNOSIS — I10 Essential (primary) hypertension: Secondary | ICD-10-CM

## 2022-02-22 DIAGNOSIS — I25810 Atherosclerosis of coronary artery bypass graft(s) without angina pectoris: Secondary | ICD-10-CM

## 2022-02-22 DIAGNOSIS — E782 Mixed hyperlipidemia: Secondary | ICD-10-CM

## 2022-02-22 DIAGNOSIS — I480 Paroxysmal atrial fibrillation: Secondary | ICD-10-CM

## 2022-02-22 DIAGNOSIS — D6869 Other thrombophilia: Secondary | ICD-10-CM

## 2022-02-22 DIAGNOSIS — I5032 Chronic diastolic (congestive) heart failure: Secondary | ICD-10-CM

## 2022-02-22 NOTE — Patient Instructions (Signed)
Medication Instructions:  ?Take Aspirin  ? ?*If you need a refill on your cardiac medications before your next appointment, please call your pharmacy* ? ?Follow-Up: ?At [facility], you and your health needs are our priority.  As part of our continuing mission to provide you with exceptional heart care, we have created designated Provider Care Teams.  These Care Teams include your primary Cardiologist (physician) and Advanced Practice Providers (APPs -  Physician Assistants and Nurse Practitioners) who all work together to provide you with the care you need, when you need it. ? ?We recommend signing up for the patient portal called "MyChart".  Sign up information is provided on this After Visit Summary.  MyChart is used to connect with patients for Virtual Visits (Telemedicine).  Patients are able to view lab/test results, encounter notes, upcoming appointments, etc.  Non-urgent messages can be sent to your provider as well.   ?To learn more about what you can do with MyChart, go to [email or web address].   ? ?Your next appointment:   ?6 month(s) ? ?The format for your next appointment:   ?In Person ? ?Provider:   ?Sande Rives, PA-C, Almyra Deforest, PA-C, or Diona Browner, NP, or Eleonore Chiquito, MD ? ? ? ?Other Instructions ?Increase water intake. ? ?

## 2022-03-12 ENCOUNTER — Other Ambulatory Visit: Payer: Self-pay | Admitting: Cardiovascular Disease

## 2022-04-23 ENCOUNTER — Telehealth: Payer: Self-pay

## 2022-04-23 NOTE — Telephone Encounter (Signed)
Patient with diagnosis of afib on Eliquis for anticoagulation.    Procedure: LT REVERSE SHOULDER ARTHROPLASTY  Date of procedure: TBD   CHA2DS2-VASc Score = 9  This indicates a 12.2% annual risk of stroke. The patient's score is based upon: CHF History: 1 HTN History: 1 Diabetes History: 1 Stroke History: 2 Vascular Disease History: 1 Age Score: 2 Gender Score: 1   CrCl 58m/min using adjusted body weight Platelet count 194K  Typically hold Eliquis for 3 days before major procedures including shoulder replacement. Pt is at elevated CV risk off of anticoag given CHADS2VASc score of 9. Will forward to MD for input.

## 2022-04-23 NOTE — Telephone Encounter (Unsigned)
   Pre-operative Risk Assessment    Patient Name: Judy [name]  DOB: [birth year] MRN: [id number]{ HEARTCARE STAFF-IMPORTANT INSTRUCTIONS 1 Red and Blue Text will auto delete once note is signed or closed. 2 Press F2 to navigate through template.   3 On drop down lists, L click to select >> R click to activate next field 4 Reason for Visit format is IMPORTANT!!  See Directions on No. 2 below. 5 Please review chart to determine if there is already a clearance note open for this procedure!!  DO NOT duplicate if a note already exists!!    :1}      Request for Surgical Clearance{ 1. What type of surgery is being performed? Enter name of procedure below and number of teeth if dental extraction.  :1}    Procedure:   LT REVERSE SHOULDER ARTHROPLASTY  { 2. When is this surgery scheduled? Press F2 to enter date below and place date in Reason for Visit (see directions below). :1} Date of Surgery:  Clearance TBD                             { For convenience, highlight and copy (CTL+C) the Clearance MM/DD/YY phrase above. Click here to go to Reason for Visit.  Paste (CTL+V) the date.  Merchandiser, retail.  Then click button underneath called Add Clearance MM/DD/YY as free text.     :1}  { 3. What is the name of the Surgeon, the Surgeon's Group or Practice, phone and fax number?  Press F2 and list below :1}  Surgeon:  DR. Lennette Bihari SUPPLE  Surgeon's Group or Practice Name:  [facility] Phone number:  [phone] Fax number:  [phone] [place]  { 4. What type of clearance is requested?  Medical or Cardiac Clearance only?  Pharmacy Clearance Only (Request is to hold medication only)?  Or Both?  Press F2 and select the clearance requested.  If both are needed, select both from the drop down list.     :1}  Type of Clearance Requested:   - Pharmacy:  Hold Apixaban (Eliquis)   { 5. What type of anesthesia will be used?  Press F2 and select the anesthesia to be used for the procedure.  :1}  Type of  Anesthesia:  General  { 6. Are there any other requests or questions from the surgeon?    :1}  Additional requests/questions:  {Select additional requests/questions or use asterisks to free text (Optional):[id number]}  Signed, Zebedee Iba   04/23/2022, 9:23 AM

## 2022-04-24 ENCOUNTER — Telehealth: Payer: Self-pay

## 2022-04-24 NOTE — Telephone Encounter (Signed)
Primary Cardiologist:Apollo Doreatha Lew, MD  Chart reviewed as part of pre-operative protocol coverage. Because of Judy [name]'s past medical history and time since last visit, he/she will require a virtual visit/telephone call in order to better assess preoperative cardiovascular risk.  Pre-op covering staff: - Please contact patient, obtain consent, and schedule appointment   Guidelines for holding Eliquis will be attached to final clearance.  Emmaline Life, NP-C    04/24/2022, 8:38 AM [place] [address] Office [phone] Fax [phone]

## 2022-04-24 NOTE — Telephone Encounter (Signed)
  Patient Consent for Virtual Visit        Judy [name] has provided verbal consent on 04/24/2022 for a virtual visit (video or telephone).   CONSENT FOR VIRTUAL VISIT FOR:  Judy [name]  By participating in this virtual visit I agree to the following:  I hereby voluntarily request, consent and authorize [facility] and its employed or contracted physicians, physician assistants, nurse practitioners or other licensed health care professionals (the Practitioner), to provide me with telemedicine health care services (the "Services") as deemed necessary by the treating Practitioner. I acknowledge and consent to receive the Services by the Practitioner via telemedicine. I understand that the telemedicine visit will involve communicating with the Practitioner through live audiovisual communication technology and the disclosure of certain medical information by electronic transmission. I acknowledge that I have been given the opportunity to request an in-person assessment or other available alternative prior to the telemedicine visit and am voluntarily participating in the telemedicine visit.  I understand that I have the right to withhold or withdraw my consent to the use of telemedicine in the course of my care at any time, without affecting my right to future care or treatment, and that the Practitioner or I may terminate the telemedicine visit at any time. I understand that I have the right to inspect all information obtained and/or recorded in the course of the telemedicine visit and may receive copies of available information for a reasonable fee.  I understand that some of the potential risks of receiving the Services via telemedicine include:  Delay or interruption in medical evaluation due to technological equipment failure or disruption; Information transmitted may not be sufficient (e.g. poor resolution of images) to allow for appropriate medical decision making by the Practitioner; and/or   In rare instances, security protocols could fail, causing a breach of personal health information.  Furthermore, I acknowledge that it is my responsibility to provide information about my medical history, conditions and care that is complete and accurate to the best of my ability. I acknowledge that Practitioner's advice, recommendations, and/or decision may be based on factors not within their control, such as incomplete or inaccurate data provided by me or distortions of diagnostic images or specimens that may result from electronic transmissions. I understand that the practice of medicine is not an exact science and that Practitioner makes no warranties or guarantees regarding treatment outcomes. I acknowledge that a copy of this consent can be made available to me via my patient portal ([facility]), or I can request a printed copy by calling the office of [facility].    I understand that my insurance will be billed for this visit.   I have read or had this consent read to me. I understand the contents of this consent, which adequately explains the benefits and risks of the Services being provided via telemedicine.  I have been provided ample opportunity to ask questions regarding this consent and the Services and have had my questions answered to my satisfaction. I give my informed consent for the services to be provided through the use of telemedicine in my medical care   con

## 2022-04-24 NOTE — Telephone Encounter (Signed)
Pt is scheduled for a tele preop visit for clearance on 5/26. Consent and med rec reviewed.

## 2022-04-27 ENCOUNTER — Ambulatory Visit (INDEPENDENT_AMBULATORY_CARE_PROVIDER_SITE_OTHER): Payer: Medicare Other | Admitting: General Practice

## 2022-04-27 ENCOUNTER — Ambulatory Visit (INDEPENDENT_AMBULATORY_CARE_PROVIDER_SITE_OTHER): Payer: Medicare Other | Admitting: Cardiovascular Disease

## 2022-04-27 DIAGNOSIS — Z01810 Encounter for preprocedural cardiovascular examination: Secondary | ICD-10-CM | POA: Diagnosis not present

## 2022-04-27 NOTE — Addendum Note (Signed)
Addended by: Deberah Pelton on: 04/27/2022 03:27 PM   Modules accepted: Level of Service

## 2022-04-27 NOTE — Progress Notes (Signed)
Pre op provider tried to reach the pt a number of times for tele pre op appt today. Pt did not answer and no vm to leave a message. I also myself tried to reach the pt and no answer or vm. Will send FYI to requesting office pt not cleared until she has spoken with the pre op provider. I will send a letter to the pt to call back and reschedule the tele pre op appt. I will remove from the pool. Will re-address once the pt calls back for appt.

## 2022-04-27 NOTE — Progress Notes (Unsigned)
error 

## 2022-04-27 NOTE — Progress Notes (Signed)
Virtual Visit via Telephone Note   Because of Elianah Karis Trotti's co-morbid illnesses, she is at least at moderate risk for complications without adequate follow up.  This format is felt to be most appropriate for this patient at this time.  The patient did not have access to video technology/had technical difficulties with video requiring transitioning to audio format only (telephone).  All issues noted in this document were discussed and addressed.  No physical exam could be performed with this format.  Please refer to the patient's chart for her consent to telehealth for [facility].  Evaluation Performed:  Preoperative cardiovascular risk assessment _____________   Date:  04/27/2022   Patient ID:  Judy [name], Judy [name] 1941/06/22, MRN [id number] Patient Location:  Home Provider location:   Office  Primary Care Provider:  Michael Boston, MD Primary Cardiologist:  Evalina Field, MD  Chief Complaint / Patient Profile   81 y.o. y/o female with a h/o coronary artery disease, hyperlipidemia, essential hypertension, chronic diastolic CHF, PAF who is pending left reverse shoulder arthroplasty and presents today for telephonic preoperative cardiovascular risk assessment.  Past Medical History    Past Medical History:  Diagnosis Date   Anxiety    Back pain    Complication of anesthesia 2003   gallbladder surgery-resp. arrest-stop operation and pt. went to ICU - patient stated she has back surgery since then and had no trouble at all   Coronary artery disease    Depression    Diabetes ([place])    Dyspnea    Gout    Heart attack [facility])    March 2021   History of kidney stones    Hyperlipidemia    Hypertension    Leg edema    Pancreatitis, chronic ([place]) 03/2016   Pneumonia due to COVID-19 virus 12/03/2019   Sleep apnea    Stroke [facility])    March 2021   Past Surgical History:  Procedure Laterality Date   BACK SURGERY     BREAST LUMPECTOMY WITH RADIOACTIVE SEED LOCALIZATION Right  10/23/2019   Procedure: RIGHT BREAST LUMPECTOMY WITH RADIOACTIVE SEED LOCALIZATION;  Surgeon: Jovita Kussmaul, MD;  Location: [facility];  Service: General;  Laterality: Right;   CARDIAC CATHETERIZATION     CARDIAC SURGERY     CHOLECYSTECTOMY     COLONOSCOPY  2013   CORONARY ARTERY BYPASS GRAFT N/A 03/03/2020   Procedure: CORONARY ARTERY BYPASS GRAFTING (CABG), ON PUMP, TIMES FIVE, USING LEFT INTERNAL MAMMARY ARTERY AND ENDOSCOPICALLY HARVESTED BILATERAL GREATER SAPHENOUS VEINS -flow track only;  Surgeon: Lajuana Matte, MD;  Location: [facility];  Service: Open Heart Surgery;  Laterality: N/A;   LEFT HEART CATH AND CORONARY ANGIOGRAPHY N/A 03/01/2020   Procedure: LEFT HEART CATH AND CORONARY ANGIOGRAPHY;  Surgeon: Belva Crome, MD;  Location: [facility];  Service: Cardiovascular;  Laterality: N/A;   TEE WITHOUT CARDIOVERSION N/A 03/03/2020   Procedure: TRANSESOPHAGEAL ECHOCARDIOGRAM (TEE);  Surgeon: Lajuana Matte, MD;  Location: [place] [facility];  Service: Open Heart Surgery;  Laterality: N/A;    Allergies  No Known Allergies  History of Present Illness    Judy [name] is a 81 y.o. female who presents via audio/video conferencing for a telehealth visit today.  Pt was last seen in cardiology clinic on 02/22/2022 by Dr. Audie Box.  At that time [name] was doing well .  The patient is now pending procedure as outlined above. Since her last visit, she remained stable from a cardiac  standpoint.  Today she denies chest pain, shortness of breath, lower extremity edema, fatigue, palpitations, melena, hematuria, hemoptysis, diaphoresis, weakness, presyncope, syncope, orthopnea, and PND.   Home Medications    Prior to Admission medications   Medication Sig Start Date End Date Taking? Authorizing Provider  Accu-Chek Softclix Lancets lancets USE TO CHECK BLOOD SUGAR TWICE DAILY 05/11/21   Elsie Stain, MD  aspirin EC 81 MG tablet Take 1 tablet (81 mg total) by mouth daily. Swallow whole.  02/23/21   O'NealCassie Freer, MD  atorvastatin (LIPITOR) 20 MG tablet Take 1 tablet (20 mg total) by mouth at bedtime. 05/04/20   O'Neal, Cassie Freer, MD  Cyanocobalamin (B-12 COMPLIANCE INJECTION) 1000 MCG/ML KIT Inject 1,000 mcg as directed every 30 (thirty) days.    [provider]  DULoxetine (CYMBALTA) 60 MG capsule Take 60 mg by mouth daily.    [provider]  ELIQUIS 5 MG TABS tablet TAKE ONE TABLET BY MOUTH TWICE DAILY 01/10/22   O'Neal, Cassie Freer, MD  empagliflozin (JARDIANCE) 25 MG TABS tablet  02/02/20   [provider]  furosemide (LASIX) 20 MG tablet TAKE ONE TABLET BY MOUTH ONCE DAILY 01/10/22   O'Neal, Cassie Freer, MD  glucose blood (ACCU-CHEK AVIVA) test strip Test twice daily 04/04/20   Elsie Stain, MD  losartan (COZAAR) 25 MG tablet Take 1 tablet (25 mg total) by mouth daily. 04/04/20   Elsie Stain, MD  metFORMIN (GLUCOPHAGE-XR) 750 MG 24 hr tablet Take 750 mg by mouth daily with breakfast. 10/11/20   [provider]  methocarbamol (ROBAXIN) 500 MG tablet Take 500 mg by mouth every 8 (eight) hours as needed for muscle spasms.    [provider]  metoprolol tartrate (LOPRESSOR) 25 MG tablet TAKE 1/2 TABLET BY MOUTH EVERY MORNING and TAKE 1/2 TABLET BY MOUTH EVERYDAY AT BEDTIME 03/12/22   O'Neal, Cassie Freer, MD  NON FORMULARY 1 each by Other route See admin instructions. CPAP machine nightly    [provider]  traMADol (ULTRAM) 50 MG tablet Take 50 mg by mouth every 8 (eight) hours as needed. 02/14/22   [provider]  traZODone (DESYREL) 100 MG tablet Take 150 mg by mouth at bedtime. 04/28/20   [provider]    Physical Exam    Vital Signs:  [name] does not have vital signs available for review today.  Given telephonic nature of communication, physical exam is limited. AAOx3. NAD. Normal affect.  Speech and respirations are unlabored.  Accessory Clinical Findings     None  Assessment & Plan    Preoperative Cardiovascular Risk Assessment: Left reverse shoulder arthroplasty, Dr. Onnie Graham     Primary Cardiologist: Evalina Field, MD  Chart reviewed as part of pre-operative protocol coverage. Given past medical history and time since last visit, based on ACC/AHA guidelines, JAKELIN TAUSSIG would be at acceptable risk for the planned procedure without further cardiovascular testing.   Patient was advised that if she develops new symptoms prior to surgery to contact our office to arrange a follow-up appointment.  She verbalized understanding.  Her RCRI is a class IV risk, 11% risk of major cardiac event.  She is able to complete greater than 4 METS of physical activity.  Patient with diagnosis of afib on Eliquis for anticoagulation.     Procedure: LT REVERSE SHOULDER ARTHROPLASTY  Date of procedure: TBD    CHA2DS2-VASc Score = 9  This indicates a 12.2% annual risk of stroke. The  patient's score is based upon: CHF History: 1 HTN History: 1 Diabetes History: 1 Stroke History: 2 Vascular Disease History: 1 Age Score: 2 Gender Score: 1   CrCl 67m/min using adjusted body weight Platelet count 194K   Typically hold Eliquis for 3 days before major procedures including shoulder replacement. Pt is at elevated CV risk off of anticoag given CHADS2VASc score of 9.   Time:   Today, I have spent 6 minutes with the patient with telehealth technology discussing medical history, symptoms, and management plan.  Prior to her phone evaluation spent greater than 10 minutes reviewing her past medical history and medications.   JDeberah Pelton NP  04/27/2022, 12:51 PM

## 2022-04-27 NOTE — Telephone Encounter (Signed)
Pre op provider tried to reach the pt a number of times for tele pre op appt today. Pt did not answer and no vm to leave a message. I also myself tried to reach the pt and no answer or vm. Will send FYI to requesting office pt not cleared until she has spoken with the pre op provider. I will send a letter to the pt to call back and reschedule the tele pre op appt. I will remove from the pool. Will re-address once the pt calls back for appt.

## 2022-04-27 NOTE — Progress Notes (Signed)
Preoperative team, attempted to contact patient 4 times for preoperative phone evaluation.  Unable to leave message.  Patient will need to reschedule phone evaluation.  Please contact requesting office and let them know that we have not been able to contact the patient.  Thank you for your help.  Jossie Ng. Annie Roseboom NP-C    04/27/2022, 10:50 AM [place] [address] Office [phone] Fax [phone]

## 2022-05-02 ENCOUNTER — Other Ambulatory Visit: Payer: Self-pay | Admitting: Family Medicine

## 2022-05-02 DIAGNOSIS — R748 Abnormal levels of other serum enzymes: Secondary | ICD-10-CM

## 2022-05-16 ENCOUNTER — Ambulatory Visit
Admission: RE | Admit: 2022-05-16 | Discharge: 2022-05-16 | Disposition: A | Discharge status: Home / Self Care | Payer: Medicare Other | Source: Ambulatory Visit | Attending: Family Medicine | Admitting: Family Medicine

## 2022-05-16 DIAGNOSIS — R748 Abnormal levels of other serum enzymes: Secondary | ICD-10-CM

## 2022-06-04 IMAGING — CT CT HEAD W/O CM
3 series · 16 of 47 positions shown, 19 images · non-contrast
Comparison: March 01, 2020

CLINICAL DATA: Posttraumatic headache

EXAM:
CT HEAD WITHOUT CONTRAST
TECHNIQUE: Contiguous axial images were obtained from the base of the skull
through the vertex without intravenous contrast.

[Series 2: head wo · axial · 0.46mm/px · z∈[-162,-32]mm · 10 of 32 slices shown, 13 images]
[im 3/32  brain]
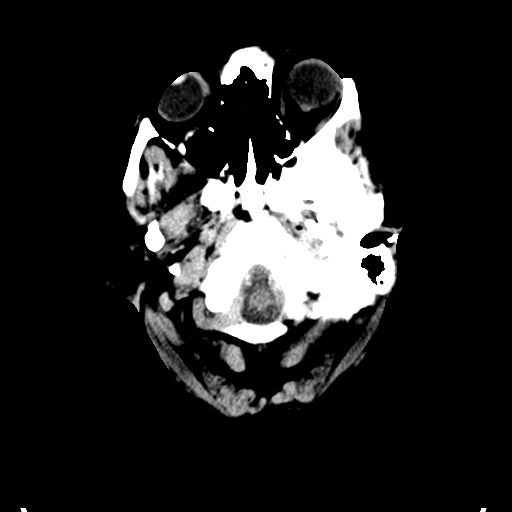
[im 3/32  bone]
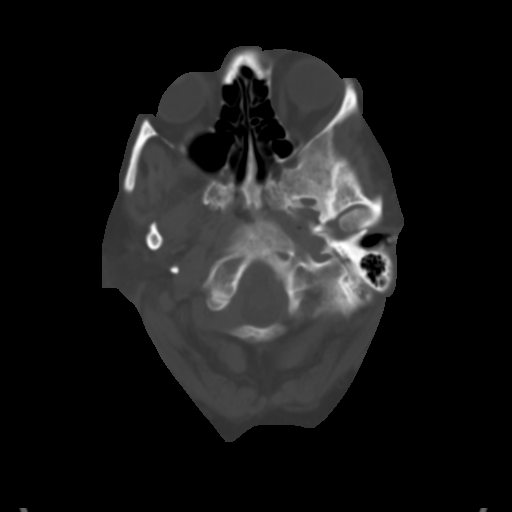
[im 6/32  brain]
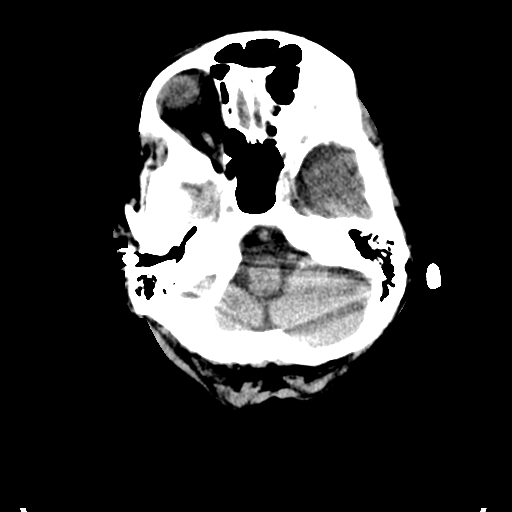
[im 9/32  brain]
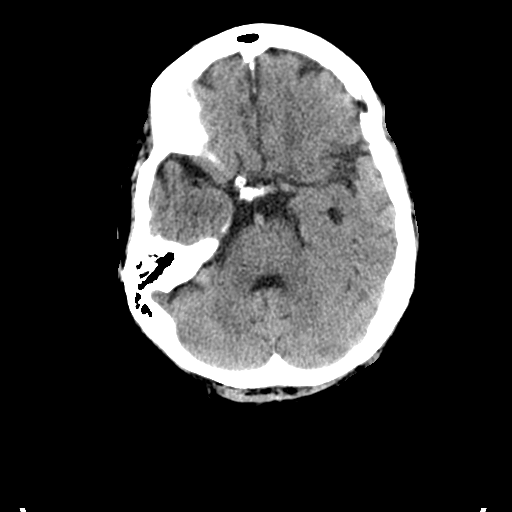
[im 11/32  brain]
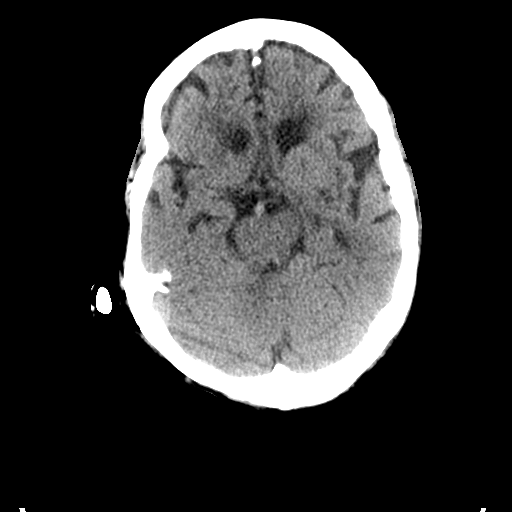
[im 14/32  brain]
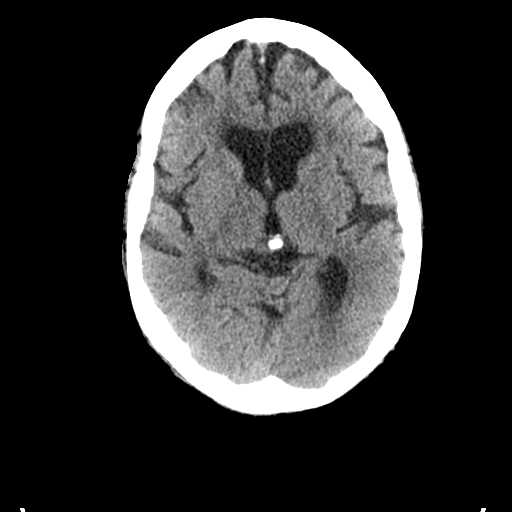
[im 14/32  bone]
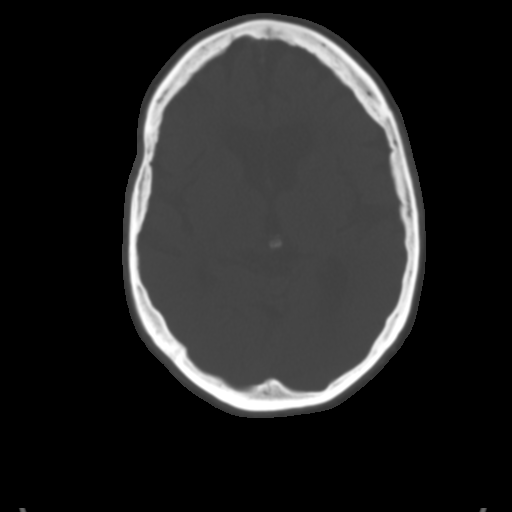
[im 18/32  brain]
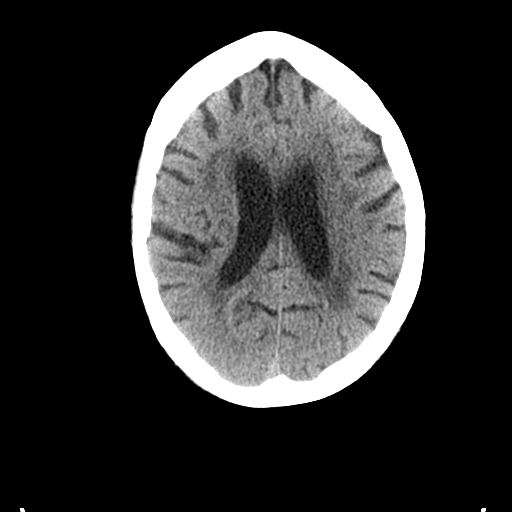
[im 21/32  brain]
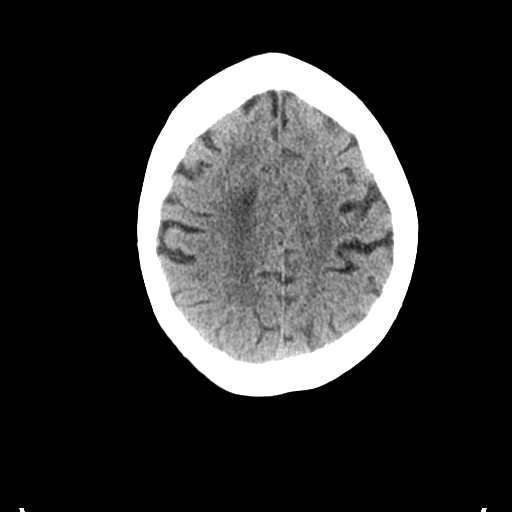
[im 24/32  brain]
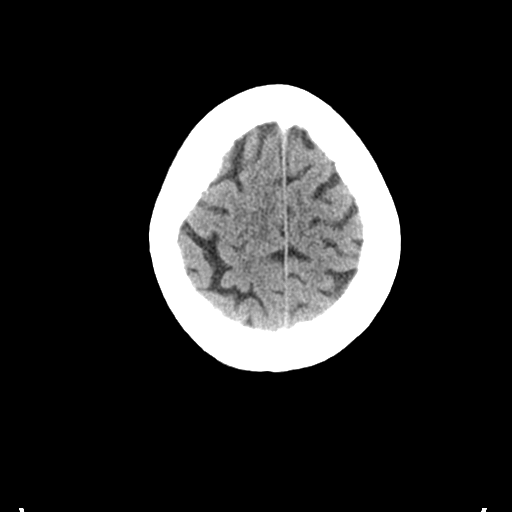
[im 26/32  brain]
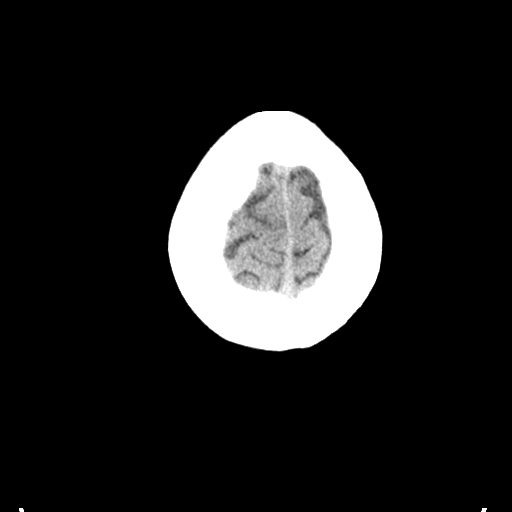
[im 26/32  bone]
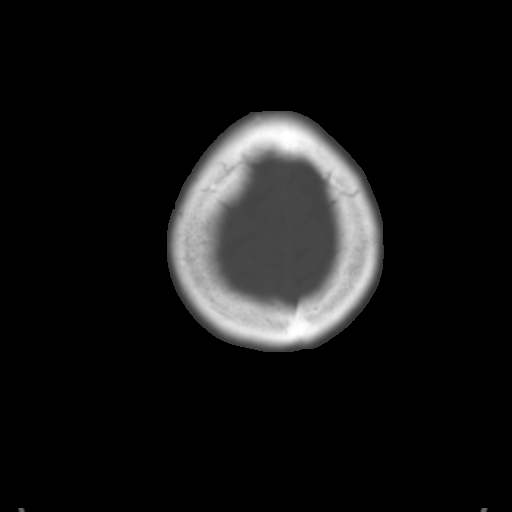
[im 29/32  brain]
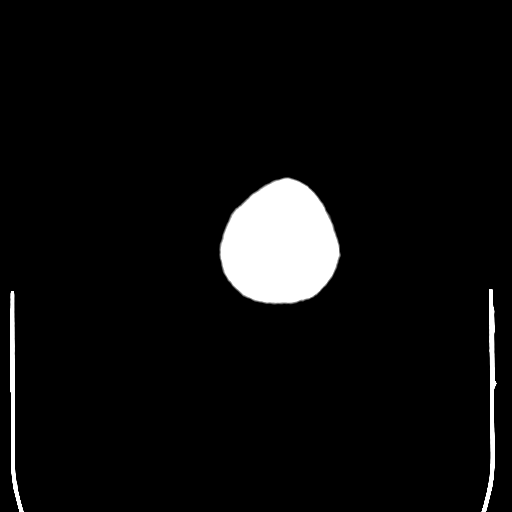

[Series 4: coronal soft tissue · coronal · 0.30mm/px · 3 of 66 slices shown]
[im 22/66  brain]
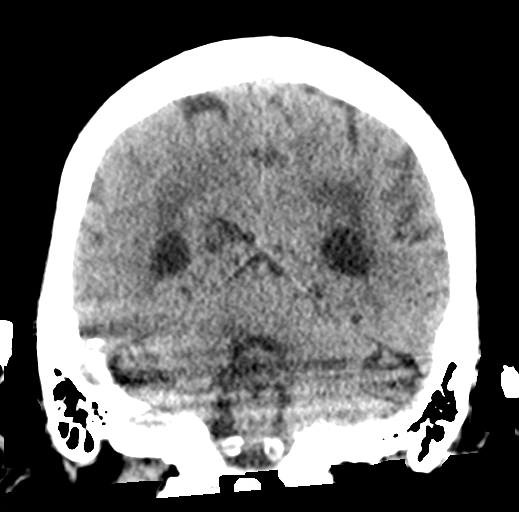
[im 29/66  brain]
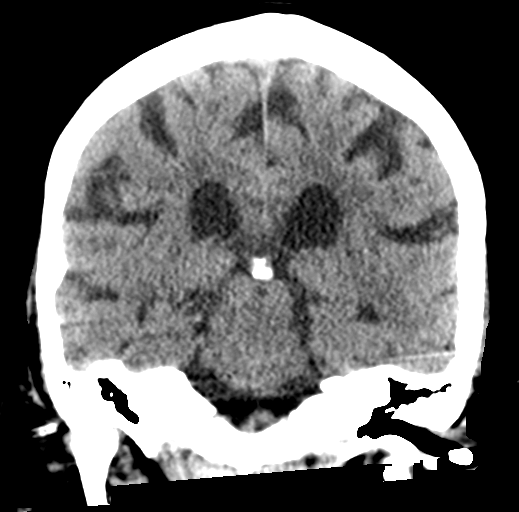
[im 37/66  brain]
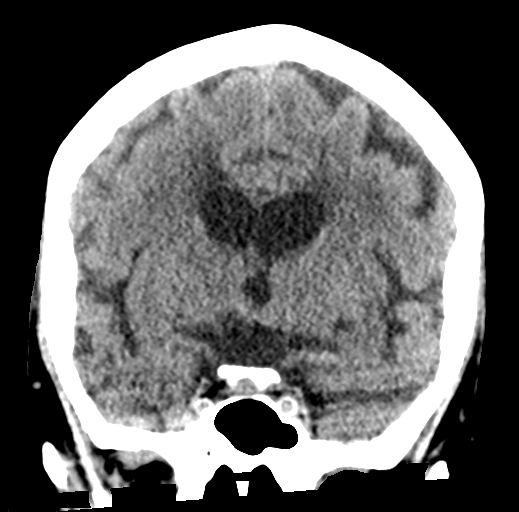

[Series 5: sagittal soft tissue · sagittal · 0.30mm/px · 3 of 53 slices shown]
[im 18/53  brain]
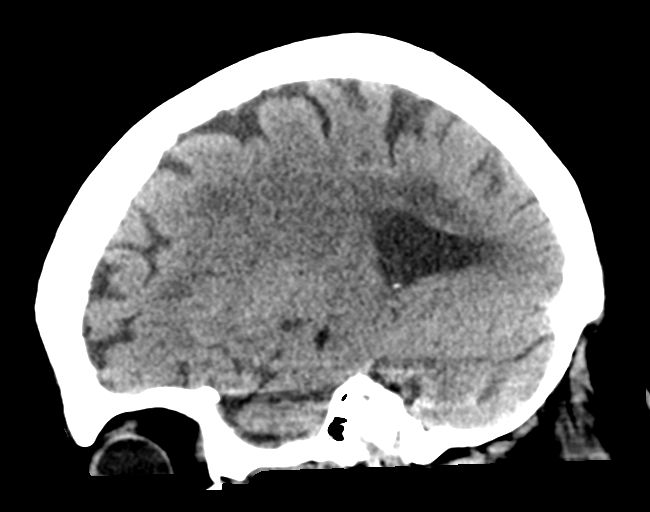
[im 27/53  brain]
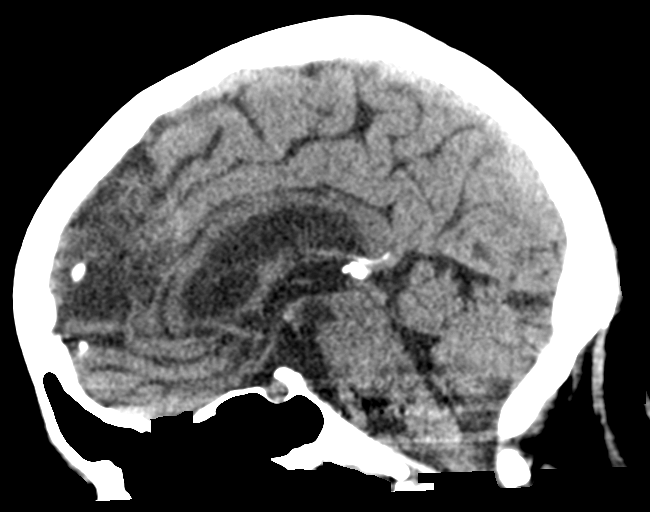
[im 35/53  brain]
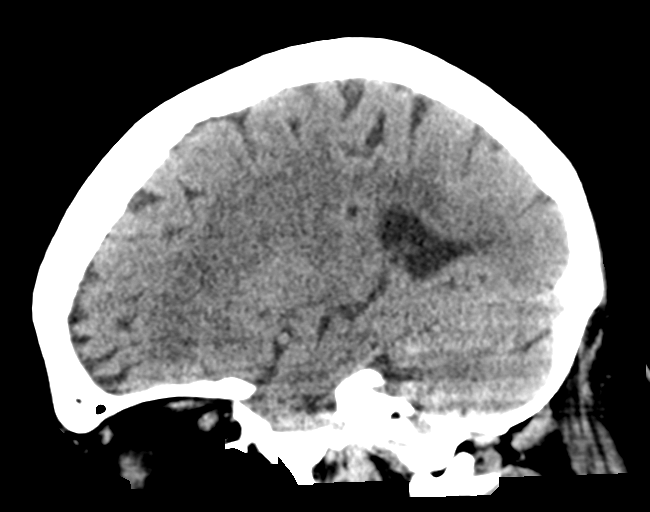

[16 of 47 positions shown; findings below may reference images not displayed]

FINDINGS: Brain: No evidence of acute infarction, hemorrhage, hydrocephalus,
extra-axial collection or mass lesion/mass effect. Signs of atrophy
and chronic microvascular ischemic change, unchanged.

Vascular: No hyperdense vessel or unexpected calcification.

Skull: Bilateral hearing aids in place. No acute or destructive bone
process.

Sinuses/Orbits: No acute finding. Limited visualization of paranasal
sinuses and orbits.

Other: None.
IMPRESSION: 1. No acute intracranial pathology.
2. Signs of atrophy and chronic microvascular ischemic change.

## 2022-06-11 NOTE — Patient Instructions (Signed)
DUE TO COVID-19 ONLY TWO VISITORS  (aged 80 and older)  ARE ALLOWED TO COME WITH YOU AND STAY IN THE WAITING ROOM ONLY DURING PRE OP AND PROCEDURE.   **NO VISITORS ARE ALLOWED IN THE SHORT STAY AREA OR RECOVERY ROOM!!**  IF YOU WILL BE ADMITTED INTO THE HOSPITAL YOU ARE ALLOWED ONLY FOUR SUPPORT PEOPLE DURING VISITATION HOURS ONLY (7 AM -8PM)   The support person(s) must pass our screening, gel in and out, and wear a mask at all times, including in the patient's room. Patients must also wear a mask when staff or their support person are in the room. Visitors GUEST BADGE MUST BE WORN VISIBLY  One adult visitor may remain with you overnight and MUST be in the room by 8 P.M.     Your procedure is scheduled on: 06/21/22   Report to [facility] Main Entrance    Report to admitting at   7:45 AM   Call this number if you have problems the morning of surgery [phone]   Do not eat food :After Midnight.   After Midnight you may have the following liquids until _7:00_AM/ DAY OF SURGERY  Water Black Coffee (sugar ok, NO MILK/CREAM OR CREAMERS)  Tea (sugar ok, NO MILK/CREAM OR CREAMERS) regular and decaf                             Plain Jell-O (NO RED)                                           Fruit ices (not with fruit pulp, NO RED)                                     Popsicles (NO RED)                                                                  Juice: apple, WHITE grape, WHITE cranberry Sports drinks like Gatorade (NO RED) Clear broth(vegetable,chicken,beef)                    The day of surgery:  Drink ONE  G2 at  6:45 AM the morning of surgery. Drink in one sitting. Do not sip.  This drink was given to you during your hospital  pre-op appointment visit. Nothing else to drink after completing the  G2. At 7:00 AM          If you have questions, please contact your surgeon's office.     Oral Hygiene is also important to reduce your risk of infection.                                     Remember - BRUSH YOUR TEETH THE MORNING OF SURGERY WITH YOUR REGULAR TOOTHPASTE   Do NOT smoke after Midnight   Take these medicines the morning of surgery with A SIP OF WATER: Cymbalta, Metoprolol    Before surgery.Stop  taking __Eliquis_________on _7/17/23_________as instructed by _Dr. O'Neal____________.    How to Manage Your Diabetes Before and After Surgery  Why is it important to control my blood sugar before and after surgery? Improving blood sugar levels before and after surgery helps healing and can limit problems. A way of improving blood sugar control is eating a healthy diet by:  Eating less sugar and carbohydrates  Increasing activity/exercise  Talking with your doctor about reaching your blood sugar goals High blood sugars (greater than 180 mg/dL) can raise your risk of infections and slow your recovery, so you will need to focus on controlling your diabetes during the weeks before surgery. Make sure that the doctor who takes care of your diabetes knows about your planned surgery including the date and location.  How do I manage my blood sugar before surgery? Check your blood sugar at least 4 times a day, starting 2 days before surgery, to make sure that the level is not too high or low. Check your blood sugar the morning of your surgery when you wake up and every 2 hours until you get to the Short Stay unit. If your blood sugar is less than 70 mg/dL, you will need to treat for low blood sugar: Do not take insulin. Treat a low blood sugar (less than 70 mg/dL) with  cup of clear juice (cranberry or apple), 4 glucose tablets, OR glucose gel. Recheck blood sugar in 15 minutes after treatment (to make sure it is greater than 70 mg/dL). If your blood sugar is not greater than 70 mg/dL on recheck, call [phone] for further instructions. Report your blood sugar to the short stay nurse when you get to Short Stay.  If you are admitted to the hospital  after surgery: Your blood sugar will be checked by the staff and you will probably be given insulin after surgery (instead of oral diabetes medicines) to make sure you have good blood sugar levels. The goal for blood sugar control after surgery is 80-180 mg/dL.   WHAT DO I DO ABOUT MY DIABETES MEDICATION?  Do not take your Jardiance the night before surgery. It is OK to take your Metformin  Do not take oral diabetes medicines (pills) the morning of surgery.   Bring CPAP mask and tubing day of surgery.                              You may not have any metal on your body including hair pins, jewelry, and body piercing             Do not wear make-up, lotions, powders, perfumes/cologne, or deodorant  Do not wear nail polish including gel and S&S, artificial/acrylic nails, or any other type of covering on natural nails including finger and toenails. If you have artificial nails, gel coating, etc. that needs to be removed by a nail salon please have this removed prior to surgery or surgery may need to be canceled/ delayed if the surgeon/ anesthesia feels like they are unable to be safely monitored.   Do not shave  48 hours prior to surgery.                  Do not bring valuables to the hospital. Judy [name].   Contacts, dentures or bridgework may not be worn into surgery.  Patients discharged on the day of surgery will not be allowed to drive home.  Someone NEEDS to stay with you for the first 24 hours after anesthesia.   Special Instructions: Bring a copy of your healthcare power of attorney and living will documents the day of surgery if you haven't scanned them before.              Please read over the following fact sheets you were given: IF YOU HAVE QUESTIONS ABOUT YOUR PRE-OP INSTRUCTIONS PLEASE CALL [place]- Preparing for Total Shoulder Arthroplasty    Before surgery, you can play an important role.  Because skin is not sterile, your skin needs to be as free of germs as possible. You can reduce the number of germs on your skin by using the following products. Benzoyl Peroxide Gel Reduces the number of germs present on the skin Applied twice a day to shoulder area starting two days before surgery    ==================================================================  Please follow these instructions carefully:  BENZOYL PEROXIDE 5% GEL  Please do not use if you have an allergy to benzoyl peroxide.   If your skin becomes reddened/irritated stop using the benzoyl peroxide.  Starting two days before surgery, apply as follows: Apply benzoyl peroxide in the morning and at night. Apply after taking a shower. If you are not taking a shower clean entire shoulder front, back, and side along with the armpit with a clean wet washcloth.  Place a quarter-sized dollop on your shoulder and rub in thoroughly, making sure to cover the front, back, and side of your shoulder, along with the armpit.   2 days before ____ AM   ____ PM              1 day before ____ AM   ____ PM                         Do this twice a day for two days.  (Last application is the night before surgery, AFTER using the CHG soap as described below).  Do NOT apply benzoyl peroxide gel on the day of surgery.      [place] - Preparing for Surgery Before surgery, you can play an important role.  Because skin is not sterile, your skin needs to be as free of germs as possible.  You can reduce the number of germs on your skin by washing with CHG (chlorahexidine gluconate) soap before surgery.  CHG is an antiseptic cleaner which kills germs and bonds with the skin to continue killing germs even after washing. Please DO NOT use if you have an allergy to CHG or antibacterial soaps.  If your skin becomes reddened/irritated stop using the CHG and inform your nurse when you arrive at Short Stay. Do not shave (including legs and underarms) for  at least 48 hours prior to the first CHG shower.   Please follow these instructions carefully:  1.  Shower with CHG Soap the night before surgery and the  morning of Surgery.  2.  If you choose to wash your hair, wash your hair first as usual with your  normal  shampoo.  3.  After you shampoo, rinse your hair and body thoroughly to remove the  shampoo.                           4.  Use CHG as you would any other  liquid soap.  You can apply chg directly  to the skin and wash                       Gently with a scrungie or clean washcloth.  5.  Apply the CHG Soap to your body ONLY FROM THE NECK DOWN.   Do not use on face/ open                           Wound or open sores. Avoid contact with eyes, ears mouth and genitals (private parts).                       Wash face,  Genitals (private parts) with your normal soap.             6.  Wash thoroughly, paying special attention to the area where your surgery  will be performed.  7.  Thoroughly rinse your body with warm water from the neck down.  8.  DO NOT shower/wash with your normal soap after using and rinsing off  the CHG Soap.                9.  Pat yourself dry with a clean towel.            10.  Wear clean pajamas.            11.  Place clean sheets on your bed the night of your first shower and do not  sleep with pets. Day of Surgery : Do not apply any lotions/deodorants the morning of surgery.  Please wear clean clothes to the hospital/surgery center.  FAILURE TO FOLLOW THESE INSTRUCTIONS MAY RESULT IN THE CANCELLATION OF YOUR SURGERY   ________________________________________________________________________   Incentive Spirometer  An incentive spirometer is a tool that can help keep your lungs clear and active. This tool measures how well you are filling your lungs with each breath. Taking long deep breaths may help reverse or decrease the chance of developing breathing (pulmonary) problems (especially infection) following: A long  period of time when you are unable to move or be active. BEFORE THE PROCEDURE  If the spirometer includes an indicator to show your best effort, your nurse or respiratory therapist will set it to a desired goal. If possible, sit up straight or lean slightly forward. Try not to slouch. Hold the incentive spirometer in an upright position. INSTRUCTIONS FOR USE  Sit on the edge of your bed if possible, or sit up as far as you can in bed or on a chair. Hold the incentive spirometer in an upright position. Breathe out normally. Place the mouthpiece in your mouth and seal your lips tightly around it. Breathe in slowly and as deeply as possible, raising the piston or the ball toward the top of the column. Hold your breath for 3-5 seconds or for as long as possible. Allow the piston or ball to fall to the bottom of the column. Remove the mouthpiece from your mouth and breathe out normally. Rest for a few seconds and repeat Steps 1 through 7 at least 10 times every 1-2 hours when you are awake. Take your time and take a few normal breaths between deep breaths. The spirometer may include an indicator to show your best effort. Use the indicator as a goal to work toward during each repetition. After each set of 10 deep breaths, practice coughing to be  sure your lungs are clear. If you have an incision (the cut made at the time of surgery), support your incision when coughing by placing a pillow or rolled up towels firmly against it. Once you are able to get out of bed, walk around indoors and cough well. You may stop using the incentive spirometer when instructed by your caregiver.  RISKS AND COMPLICATIONS Take your time so you do not get dizzy or light-headed. If you are in pain, you may need to take or ask for pain medication before doing incentive spirometry. It is harder to take a deep breath if you are having pain. AFTER USE Rest and breathe slowly and easily. It can be helpful to keep track of a log  of your progress. Your caregiver can provide you with a simple table to help with this. If you are using the spirometer at home, follow these instructions: [place] IF:  You are having difficultly using the spirometer. You have trouble using the spirometer as often as instructed. Your pain medication is not giving enough relief while using the spirometer. You develop fever of 100.5 F (38.1 C) or higher. SEEK IMMEDIATE MEDICAL CARE IF:  You cough up bloody sputum that had not been present before. You develop fever of 102 F (38.9 C) or greater. You develop worsening pain at or near the incision site. MAKE SURE YOU:  Understand these instructions. Will watch your condition. Will get help right away if you are not doing well or get worse. Document Released: 04/01/2007 Document Revised: 02/11/2012 Document Reviewed: 06/02/2007 [facility] Patient Information 2014 [place], [place].   ________________________________________________________________________

## 2022-06-12 ENCOUNTER — Other Ambulatory Visit: Payer: Self-pay

## 2022-06-12 ENCOUNTER — Telehealth: Payer: Self-pay | Admitting: Cardiovascular Disease

## 2022-06-12 MED ORDER — FUROSEMIDE 20 MG PO TABS: ORAL_TABLET | ORAL | 0 refills | Status: DC

## 2022-06-12 NOTE — Telephone Encounter (Addendum)
Patient reports that walking causes sob and that her eyes get a film over them. This started on May 22, 2022. She denies chest pain or edema. She has seen ophthalmologist. She is taking meds as prescribed. She sounded sob on phone. Dr. Harl Bowie (DOD) ordered lasix 40 mg for 5 days and schedule with APP. Informed patient who handed phone over to her roommate Minna Merritts. Informed Minna Merritts that patient can take an extra lasix today and an extra one for the next four days. She verbalized understanding. Appointment made with E. Monge for 7/14. Prescription for lasix '20mg'$  tablets (5) sent to patient's pharmacy. Recommended that if sob worsens, patient to be taken to the ED. Patient and Minna Merritts voiced understanding.

## 2022-06-12 NOTE — Telephone Encounter (Signed)
Pt c/o Shortness Of Breath: STAT if SOB developed within the last 24 hours or pt is noticeably SOB on the phone  1. Are you currently SOB (can you hear that pt is SOB on the phone)? no  2. How long have you been experiencing SOB? Since last of Jun  3. Are you SOB when sitting or when up moving around? Moving around (when she take a few steps she cant catch her breath)  4. Are you currently experiencing any other symptoms? Patient has a glaze over both of eyes

## 2022-06-14 ENCOUNTER — Encounter (HOSPITAL_COMMUNITY): Payer: Self-pay

## 2022-06-14 ENCOUNTER — Encounter (HOSPITAL_COMMUNITY)
Admission: RE | Admit: 2022-06-14 | Discharge: 2022-06-14 | Disposition: A | Discharge status: Home / Self Care | Payer: Medicare Other | Source: Ambulatory Visit | Attending: Orthopedic Surgery | Admitting: Orthopedic Surgery

## 2022-06-14 ENCOUNTER — Other Ambulatory Visit: Payer: Self-pay

## 2022-06-14 DIAGNOSIS — Z01812 Encounter for preprocedural laboratory examination: Secondary | ICD-10-CM | POA: Diagnosis present

## 2022-06-14 DIAGNOSIS — I10 Essential (primary) hypertension: Secondary | ICD-10-CM | POA: Diagnosis not present

## 2022-06-14 DIAGNOSIS — Z01818 Encounter for other preprocedural examination: Secondary | ICD-10-CM

## 2022-06-14 DIAGNOSIS — E1159 Type 2 diabetes mellitus with other circulatory complications: Secondary | ICD-10-CM | POA: Diagnosis not present

## 2022-06-14 DIAGNOSIS — Z794 Long term (current) use of insulin: Secondary | ICD-10-CM | POA: Insufficient documentation

## 2022-06-14 HISTORY — DX: Unspecified osteoarthritis, unspecified site: M19.90

## 2022-06-14 LAB — CBC
HCT: 46.3 % — ABNORMAL HIGH (ref 36.0–46.0)
Hemoglobin: 15.4 g/dL — ABNORMAL HIGH (ref 12.0–15.0)
MCH: 30.7 pg (ref 26.0–34.0)
MCHC: 33.3 g/dL (ref 30.0–36.0)
MCV: 92.2 fL (ref 80.0–100.0)
Platelets: 286 10*3/uL (ref 150–400)
RBC: 5.02 MIL/uL (ref 3.87–5.11)
RDW: 13.1 % (ref 11.5–15.5)
WBC: 10 10*3/uL (ref 4.0–10.5)
nRBC: 0 % (ref 0.0–0.2)

## 2022-06-14 LAB — BASIC METABOLIC PANEL
Anion gap: 10 (ref 5–15)
BUN: 25 mg/dL — ABNORMAL HIGH (ref 8–23)
CO2: 30 mmol/L (ref 22–32)
Calcium: 9 mg/dL (ref 8.9–10.3)
Chloride: 100 mmol/L (ref 98–111)
Creatinine, Ser: 1.4 mg/dL — ABNORMAL HIGH (ref 0.44–1.00)
GFR, Estimated: 38 mL/min — ABNORMAL LOW (ref >60)
Glucose, Bld: 175 mg/dL — ABNORMAL HIGH (ref 70–99)
Potassium: 3.5 mmol/L (ref 3.5–5.1)
Sodium: 140 mmol/L (ref 135–145)

## 2022-06-14 LAB — HEMOGLOBIN A1C
Hgb A1c MFr Bld: 7.5 % — ABNORMAL HIGH (ref 4.8–5.6)
Mean Plasma Glucose: 168.55 mg/dL

## 2022-06-14 LAB — SURGICAL PCR SCREEN
MRSA, PCR: NEGATIVE
Staphylococcus aureus: POSITIVE — AB

## 2022-06-14 LAB — GLUCOSE, CAPILLARY: Glucose-Capillary: 213 mg/dL — ABNORMAL HIGH (ref 70–99)

## 2022-06-14 NOTE — Progress Notes (Unsigned)
Cardiology Office Note:    Date:  06/16/2022   ID:  [name], DOB 01/02/1941, MRN [id number]  PCP:  Michael Boston, MD   [facility] Providers Cardiologist:  Evalina Field, MD     Referring MD: Michael Boston, MD   Chief Complaint: DOE  History of Present Illness:    Judy [name] is a very pleasant 81 y.o. female with a hx of CAD s/p CABG, hypertension, hyperlipidemia, PAF on chronic anticoagulation, atrial fib with RVR, HFpEF, type 2 diabetes, CVA post cath complication, OSA, carotid artery stenosis (1-39% bilaterally), hyperlipidemia  March 2021 NSTEMI with 3V CAD, underwent CABG x 5 (LIMA-LAD, SVG to OM2, OM3, D1, PLV (OM3 jump graft off D1) on 03/03/20. Developed a fib shortly after surgery.   She was last seen in our office on 02/22/2022 by Dr. Audie Box.  She reported the previous night she had a sensation of rapid heartbeat and chest pain.  Symptoms occurred at rest, not exertional.  EMS evaluated her and everything looked okay.  She did not go to the ER.  In the office, her EKG demonstrated NSR with PACs.  Known prior inferior infarct, no acute ischemic changes.  She reported stress at home remodeling her house.  No changes were made to treatment regimen and she was advised to follow-up in 6 months.   On 04/23/22 we received request for clearance for shoulder surgery scheduled for 06/21/2022.  She was scheduled for a preop cardiovascular virtual visit on 04/27/2022, however the provider was unable to get in touch with her.  She contacted our office on 06/12/2022 to report shortness of breath.  She was advised to take Lasix 40 mg for 5 days and follow-up with APP. This was an increase from 20 mg daily.  Today, she reports she continues to have SOB. Also has bilateral LE edema. We monitored her oxygen saturation walking in and it was stable.  She reports she felt very winded walking just a few 100 yards, had to sit down and sip some water to feel better. Reports a 2-3 day history of a  film over her eyes. Denies sudden loss of vision or seeing a blackness or something like a curtain coming down. Glucose has been elevated recently, is frequently thirsty. Also notes more frequent headaches recently. Pain in left shoulder scheduled for surgery 7/20.  Seems unaware that she missed her appointment for cardiac clearance and has not been cleared. Had pre-op lab work yesterday. Creatinine has bumped, not taking NSAIDs. Recently restarted Jardiance.  Will complete 5-day course of additional Lasix tomorrow, is feeling better. Good urine output, decreased leg edema. She denies orthopnea, PND. No chest pain. Takes water aerobics MWF, active with house keeping.   Past Medical History:  Diagnosis Date   Anxiety    Arthritis    Back pain    Coronary artery disease    Depression    Diabetes ([place])    Dyspnea    Gout    Heart [facility])    March 2021   History of kidney stones    Hyperlipidemia    Hypertension    Leg edema    Pancreatitis, chronic ([place]) 03/2016   Pneumonia due to COVID-19 virus 12/03/2019   Sleep apnea    C-PAP   Stroke [facility])    March 2021 Rt side weakness    Past Surgical History:  Procedure Laterality Date   BACK SURGERY  2004   BREAST LUMPECTOMY WITH RADIOACTIVE SEED LOCALIZATION  Right 10/23/2019   Procedure: RIGHT BREAST LUMPECTOMY WITH RADIOACTIVE SEED LOCALIZATION;  Surgeon: Jovita Kussmaul, MD;  Location: [place];  Service: General;  Laterality: Right;   CHOLECYSTECTOMY  2001   cardiac event during surgery   COLONOSCOPY  2013   CORONARY ARTERY BYPASS GRAFT N/A 03/03/2020   Procedure: CORONARY ARTERY BYPASS GRAFTING (CABG), ON PUMP, TIMES FIVE, USING LEFT INTERNAL MAMMARY ARTERY AND ENDOSCOPICALLY HARVESTED BILATERAL GREATER SAPHENOUS VEINS -flow track only;  Surgeon: Lajuana Matte, MD;  Location: [place];  Service: Open Heart Surgery;  Laterality: N/A;   LEFT HEART CATH AND CORONARY ANGIOGRAPHY N/A 03/01/2020   Procedure: LEFT HEART CATH AND CORONARY  ANGIOGRAPHY;  Surgeon: Belva Crome, MD;  Location: [place] CV LAB;  Service: Cardiovascular;  Laterality: N/A;   TEE WITHOUT CARDIOVERSION N/A 03/03/2020   Procedure: TRANSESOPHAGEAL ECHOCARDIOGRAM (TEE);  Surgeon: Lajuana Matte, MD;  Location: [place];  Service: Open Heart Surgery;  Laterality: N/A;    Current Medications: Current Meds  Medication Sig   Accu-Chek Softclix Lancets lancets USE TO CHECK BLOOD SUGAR TWICE DAILY   aspirin EC 81 MG tablet Take 1 tablet (81 mg total) by mouth daily. Swallow whole.   atorvastatin (LIPITOR) 20 MG tablet Take 1 tablet (20 mg total) by mouth at bedtime.   Cyanocobalamin (B-12 COMPLIANCE INJECTION) 1000 MCG/ML KIT Inject 1,000 mcg into the muscle every 30 (thirty) days.   DULoxetine (CYMBALTA) 60 MG capsule Take 60 mg by mouth daily.   ELIQUIS 5 MG TABS tablet TAKE ONE TABLET BY MOUTH TWICE DAILY   empagliflozin (JARDIANCE) 25 MG TABS tablet Take 25 mg by mouth at bedtime.   furosemide (LASIX) 20 MG tablet Take 20 mg by mouth daily.   glucose blood (ACCU-CHEK AVIVA) test strip Test twice daily   losartan (COZAAR) 25 MG tablet Take 1 tablet (25 mg total) by mouth daily.   metFORMIN (GLUCOPHAGE-XR) 750 MG 24 hr tablet Take 750 mg by mouth daily with breakfast.   metoprolol tartrate (LOPRESSOR) 25 MG tablet TAKE 1/2 TABLET BY MOUTH EVERY MORNING and TAKE 1/2 TABLET BY MOUTH EVERYDAY AT BEDTIME   NON FORMULARY 1 each by Other route See admin instructions. CPAP machine nightly   traMADol (ULTRAM) 50 MG tablet Take 50-100 mg by mouth every 8 (eight) hours as needed for moderate pain.   traZODone (DESYREL) 100 MG tablet Take 150 mg by mouth at bedtime.   [DISCONTINUED] furosemide (LASIX) 20 MG tablet TAKE ONE TABLET BY MOUTH ONCE DAILY (Patient taking differently: Take 20 mg by mouth daily. TAKE ONE TABLET BY MOUTH ONCE DAILY)     Allergies:   Patient has no known allergies.   Social History   Socioeconomic History   Marital status: Divorced     Spouse name: Not on file   Number of children: 1   Years of education: 12   Highest education level: Some college, no degree  Occupational History   Occupation: CNA  Tobacco Use   Smoking status: Never   Smokeless tobacco: Never  Vaping Use   Vaping Use: Never used  Substance and Sexual Activity   Alcohol use: No    Alcohol/week: 0.0 standard drinks of alcohol   Drug use: No   Sexual activity: Not Currently  Other Topics Concern   Not on file  Social History Narrative   Lives with Roommate   Drinks 32 ounces of caffeine daily   Right Handed   Social Determinants of Health   Financial  Resource Strain: Not on file  Food Insecurity: Not on file  Transportation Needs: Not on file  Physical Activity: Not on file  Stress: Not on file  Social Connections: Not on file     Family History: The patient's family history includes AAA (abdominal aortic aneurysm) in her father; Bladder Cancer in her brother; Breast cancer in her maternal aunt; Dementia in her mother. There is no history of Colon cancer, Stomach cancer, Esophageal cancer, or Pancreatic cancer.  ROS:   Please see the history of present illness.    + bilateral LE edema + DOE All other systems reviewed and are negative.  Labs/Other Studies Reviewed:    The following studies were reviewed today:  LHC 03/01/20  Severe multivessel coronary disease Segmental proximal to mid 90% LAD stenosis.  Distal flow is TIMI grade II.  Right coronary supplies LAD via septal perforator collaterals. 90% mid to distal LAD before the second obtuse marginal.  100% circumflex after the second obtuse marginal. Total occlusion of distal RCA.  Left ventricular branch and PDA fill by collaterals from the left coronary. Distal left main calcified with 30 to 40% narrowing Basal inferior to mid inferior wall motion abnormality hypokinetic to akinetic.  EF 55%.  LVEDP 16 mmHg   RECOMMENDATIONS:   T CTS consultation for coronary bypass  surgery which should be done soon. Resume IV heparin Continue IV nitroglycerin Patient to notify of recurrent chest pain.  Echo 02/29/20  1. Left ventricular ejection fraction, by estimation, is 45 to 50%. The  left ventricle has mildly decreased function. The left ventricle  demonstrates regional wall motion abnormalities (see scoring  diagram/findings for description). Inferior  hypokinesis. There is mild left ventricular hypertrophy. Left ventricular  diastolic parameters are indeterminate.   2. Right ventricular systolic function is normal. The right ventricular  size is normal.   3. The mitral valve is normal in structure. Trivial mitral valve  regurgitation.   4. The aortic valve was not well visualized. Aortic valve regurgitation  is not visualized. No aortic stenosis is present.   VAS US Doppler CABG/Carotid 03/01/20  Right Carotid: Velocities in the right ICA are consistent with a 1-39%  stenosis.   Left Carotid: Velocities in the left ICA are consistent with a 1-39%  stenosis.  Vertebrals: Bilateral vertebral arteries demonstrate antegrade flow.  Subclavians: Normal flow hemodynamics were seen in bilateral subclavian               arteries.   Right ABI: Resting right ankle-brachial index is within normal range. No  evidence of significant right lower extremity arterial disease.  Left ABI: Resting left ankle-brachial index is within normal range. No  evidence of significant left lower extremity arterial disease.  Right Upper Extremity: UTO due to TR band.  Left Upper Extremity: Doppler waveforms remain within normal limits with  left radial compression. Doppler waveforms decrease <50% with left ulnar  compression.   Recent Labs: 07/12/2021: ALT 12; B Natriuretic Peptide 313.6; Magnesium 1.8 06/14/2022: BUN 25; Creatinine, Ser 1.40; Hemoglobin 15.4; Platelets 286; Potassium 3.5; Sodium 140 06/15/2022: NT-Pro BNP 88  Recent Lipid Panel    Component Value Date/Time    CHOL 89 03/01/2020 0351   CHOL 184 06/19/2018 0958   TRIG 101 03/01/2020 0351   HDL 39 (L) 03/01/2020 0351   HDL 45 06/19/2018 0958   CHOLHDL 2.3 03/01/2020 0351   VLDL 20 03/01/2020 0351   LDLCALC 30 03/01/2020 0351   LDLCALC 107 (H) 06/19/2018 0254  Risk Assessment/Calculations:    CHA2DS2-VASc Score = 9  This indicates a 12.2% annual risk of stroke. The patient's score is based upon: CHF History: 1 HTN History: 1 Diabetes History: 1 Stroke History: 2 Vascular Disease History: 1 Age Score: 2 Gender Score: 1    Physical Exam:    VS:  BP 120/66   Pulse 89   Ht 5' (1.524 m)   Wt 175 lb (79.4 kg)   SpO2 95%   BMI 34.18 kg/m     Wt Readings from Last 3 Encounters:  06/15/22 175 lb (79.4 kg)  06/14/22 173 lb (78.5 kg)  02/22/22 174 lb 12.8 oz (79.3 kg)     GEN: Well developed, obese female in no acute distress HEENT: Normal NECK: No JVD; No carotid bruits CARDIAC: RRR, no murmurs, rubs, gallops RESPIRATORY:  Clear to auscultation without rales, wheezing or rhonchi  ABDOMEN: Soft, non-tender, non-distended MUSCULOSKELETAL:  Bilateral LE edema, non-pitting; No deformity. 2+ pedal pulses, equal bilaterally SKIN: Warm and dry NEUROLOGIC:  Alert and oriented x 3 PSYCHIATRIC:  Normal affect   EKG:  EKG is ordered today.  The ekg ordered today demonstrates sinus rhythm at 69 bpm, no acute ST/T abnormality  Diagnoses:    1. DOE (dyspnea on exertion)   2. Preop cardiovascular exam   3. Coronary artery disease involving coronary bypass graft of native heart without angina pectoris   4. Hyperlipidemia LDL goal <70   5. Essential hypertension   6. Blurred vision, bilateral   7. Chronic combined systolic and diastolic CHF, NYHA class 2 (HCC)    Assessment and Plan:     Dyspnea on exertion: She contacted our office on 7/11 with worsening DOE. Was advised to increase Lasix to 40 mg daily (double dose) and was subsequently scheduled for visit today. Reports some  improvement on diuretic therapy.  Continues to feel abdominal distention. Body habitus makes it difficult to assess volume status.  I do not appreciate significant volume overload. She continues activities of water aerobics and house work without significant symptoms. We will get BNP today. Will get echocardiogram as soon as possible for re-evaluation of LV function. Creatinine 1.4 on 06/14/2022.  Have advised her to take additional 20 mg Lasix daily for weight gain > 2 pounds in 24 hours or > 5 pounds in 1 week. Continue Jardiance, Lasix, metoprolol, losartan.   Chronic HFpEF: NYHA Class II symptoms. LVEF 45-50%, indeterminate diastolic parameters, mild LVH on echo 02/2020. Activity tolerance remains consistent since CABG.  As noted above, 4-day history of worsening DOE. Mild bilateral LE edema. No orthopnea, PND. Is completing a 5-day course of double Lasix at 40 mg daily and reports some improvement.  We will get a BNP today. Will get echo ASAP. Continue GDMT including metoprolol, losartan, Jardiance, Lasix.   Blurred vision: 2-3 day history of blurred vision bilaterally. We do not have equipment to perform adequate eye exam. Carotid u/s with mild carotid disease 2021.  No carotid bruit on exam today. Advised her to call her optometrist.  Advised that if there is a sudden change in vision, eye redness, or pain in her eyes prior to seeing optometrist to go immediately to the ER.  Preoperative cardiac evaluation: Scheduled for shoulder surgery 06/21/2022.  She missed her preoperative clearance appointment several weeks ago. Is in significant pain. Will get echocardiogram prior to providing cardiac clearance. She can achieve > 4 METS activity without concerning symptoms. Her RCRI for MACE is Class IV, 11%.  Clearance to  hold Eliquis for 3 days prior to shoulder surgery given by Dr. Audie Box. Addendum: echo results reviewed. She may proceed to surgery without further cardiac testing.   CAD without angina: History  of CABG x 5 02/2020. Continues activities of water aerobics and house work without chest pain. Has DOE as noted above. Will get echo to evaluate for wall motion abnormality.   PAF on chronic anticoagulation: EKG reveals sinus rhythm today. She denies palpitations or concerns for elevated HR. No bleeding concerns on aspirin and Eliquis.  Continue metoprolol, Eliquis.  Hypertension: BP is well-controlled today. Has been well-controlled at recent office visits. No medication changes today.    Disposition: 3 months with Dr. Audie Box  Medication Adjustments/Labs and Tests Ordered: Current medicines are reviewed at length with the patient today.  Concerns regarding medicines are outlined above.  Orders Placed This Encounter  Procedures   Pro b natriuretic peptide   ECHOCARDIOGRAM COMPLETE   No orders of the defined types were placed in this encounter.   Patient Instructions  Medication Instructions:   Your physician recommends that you continue on your current medications as directed. Please refer to the Current Medication list given to you today.   *If you need a refill on your cardiac medications before your next appointment, please call your pharmacy*   Lab Work:  TODAY!!!! PRO BNP  If you have labs (blood work) drawn today and your tests are completely normal, you will receive your results only by: [place] (if you have MyChart) OR A paper copy in the mail If you have any lab test that is abnormal or we need to change your treatment, we will call you to review the results.   Testing/Procedures:  Your physician has requested that you have an echocardiogram. Echocardiography is a painless test that uses sound waves to create images of your heart. It provides your doctor with information about the size and shape of your heart and how well your heart's chambers and valves are working. This procedure takes approximately one hour. There are no restrictions for this  procedure.    Follow-Up: At [facility], you and your health needs are our priority.  As part of our continuing mission to provide you with exceptional heart care, we have created designated Provider Care Teams.  These Care Teams include your primary Cardiologist (physician) and Advanced Practice Providers (APPs -  Physician Assistants and Nurse Practitioners) who all work together to provide you with the care you need, when you need it.  We recommend signing up for the patient portal called "MyChart".  Sign up information is provided on this After Visit Summary.  MyChart is used to connect with patients for Virtual Visits (Telemedicine).  Patients are able to view lab/test results, encounter notes, upcoming appointments, etc.  Non-urgent messages can be sent to your provider as well.   To learn more about what you can do with MyChart, go to [email or web address].    Your next appointment:   3 month(s)  The format for your next appointment:   In Person  Provider:   Evalina Field, MD     Other Instructions  Recommend weighing daily and keeping a log. Please call our office if you have weight gain of 2 pounds overnight or 5 pounds in 1 week.   Date  Time Weight  Important Information About Sugar         Signed, Emmaline Life, NP  06/16/2022 9:26 AM    [facility]

## 2022-06-14 NOTE — Progress Notes (Signed)
Anesthesia note:  Bowel prep reminder:NA  PCP - Dr. Rolm Gala Cardiologist -Dr. Viona Gilmore. O'Neal Other-   Chest x-ray - no EKG - 02/22/22-epic Stress Test - no ECHO - 2012 Cardiac Cath - 2001, The CABG x5 2021  Pacemaker/ICD device last checked:NA  Sleep Study - yes CPAP - yes. Pt may need it in the PACU  Fasting Blood Sugar - 134-158 Checks Blood Sugar _QD CBG at PAT was 213____  Blood Thinner:Eliquis/ Dr. Audie Box Blood Thinner Instructions:stop 3 days prior to DOS/ Dr. Audie Box Aspirin Instructions:continue Last Dose:of eliquis 06/18/22  Anesthesia review: yes  Patient denies shortness of breath, fever, cough and chest pain at PAT appointment Pt has SOB starting around June. She went to the cardiologist and was put on Lasix. It has been getting a little better but she will see Dr. Audie Box 06/15/22 before surgery  Patient verbalized understanding of instructions that were given to them at the PAT appointment. Patient was also instructed that they will need to review over the PAT instructions again at home before surgery. yes

## 2022-06-15 ENCOUNTER — Ambulatory Visit: Payer: Medicare Other | Admitting: Nurse Practitioner

## 2022-06-15 ENCOUNTER — Encounter: Payer: Self-pay | Admitting: Nurse Practitioner

## 2022-06-15 VITALS — BP 120/66 | HR 89 | Ht 60.0 in | Wt 175.0 lb

## 2022-06-15 DIAGNOSIS — I5042 Chronic combined systolic (congestive) and diastolic (congestive) heart failure: Secondary | ICD-10-CM

## 2022-06-15 DIAGNOSIS — Z01810 Encounter for preprocedural cardiovascular examination: Secondary | ICD-10-CM

## 2022-06-15 DIAGNOSIS — R0609 Other forms of dyspnea: Secondary | ICD-10-CM | POA: Diagnosis not present

## 2022-06-15 DIAGNOSIS — I25810 Atherosclerosis of coronary artery bypass graft(s) without angina pectoris: Secondary | ICD-10-CM

## 2022-06-15 DIAGNOSIS — I10 Essential (primary) hypertension: Secondary | ICD-10-CM

## 2022-06-15 DIAGNOSIS — H538 Other visual disturbances: Secondary | ICD-10-CM

## 2022-06-15 DIAGNOSIS — E785 Hyperlipidemia, unspecified: Secondary | ICD-10-CM | POA: Diagnosis not present

## 2022-06-15 NOTE — Progress Notes (Signed)
PCR screen positive for Staph. Message sent to Dr. Onnie Graham and information put in Special Needs note.

## 2022-06-15 NOTE — Patient Instructions (Signed)
Medication Instructions:   Your physician recommends that you continue on your current medications as directed. Please refer to the Current Medication list given to you today.   *If you need a refill on your cardiac medications before your next appointment, please call your pharmacy*   Lab Work:  TODAY!!!! PRO BNP  If you have labs (blood work) drawn today and your tests are completely normal, you will receive your results only by: [place] (if you have MyChart) OR A paper copy in the mail If you have any lab test that is abnormal or we need to change your treatment, we will call you to review the results.   Testing/Procedures:  Your physician has requested that you have an echocardiogram. Echocardiography is a painless test that uses sound waves to create images of your heart. It provides your doctor with information about the size and shape of your heart and how well your heart's chambers and valves are working. This procedure takes approximately one hour. There are no restrictions for this procedure.    Follow-Up: At [facility], you and your health needs are our priority.  As part of our continuing mission to provide you with exceptional heart care, we have created designated Provider Care Teams.  These Care Teams include your primary Cardiologist (physician) and Advanced Practice Providers (APPs -  Physician Assistants and Nurse Practitioners) who all work together to provide you with the care you need, when you need it.  We recommend signing up for the patient portal called "MyChart".  Sign up information is provided on this After Visit Summary.  MyChart is used to connect with patients for Virtual Visits (Telemedicine).  Patients are able to view lab/test results, encounter notes, upcoming appointments, etc.  Non-urgent messages can be sent to your provider as well.   To learn more about what you can do with MyChart, go to [email or web address].    Your next  appointment:   3 month(s)  The format for your next appointment:   In Person  Provider:   Evalina Field, MD     Other Instructions  Recommend weighing daily and keeping a log. Please call our office if you have weight gain of 2 pounds overnight or 5 pounds in 1 week.   Date  Time Weight                                             Important Information About Sugar

## 2022-06-16 ENCOUNTER — Encounter: Payer: Self-pay | Admitting: Nurse Practitioner

## 2022-06-16 LAB — PRO B NATRIURETIC PEPTIDE: NT-Pro BNP: 88 pg/mL (ref 0–738)

## 2022-06-18 ENCOUNTER — Ambulatory Visit (HOSPITAL_COMMUNITY): Payer: Medicare Other | Attending: Cardiovascular Disease

## 2022-06-18 DIAGNOSIS — I25810 Atherosclerosis of coronary artery bypass graft(s) without angina pectoris: Secondary | ICD-10-CM

## 2022-06-18 DIAGNOSIS — E785 Hyperlipidemia, unspecified: Secondary | ICD-10-CM

## 2022-06-18 DIAGNOSIS — R0609 Other forms of dyspnea: Secondary | ICD-10-CM | POA: Diagnosis present

## 2022-06-18 DIAGNOSIS — Z01810 Encounter for preprocedural cardiovascular examination: Secondary | ICD-10-CM

## 2022-06-18 LAB — ECHOCARDIOGRAM COMPLETE
Area-P 1/2: 3.12 cm2
S' Lateral: 3.1 cm

## 2022-06-18 NOTE — Addendum Note (Signed)
Addended by: Drue Novel I on: 06/18/2022 04:30 PM   Modules accepted: Orders

## 2022-06-19 NOTE — Anesthesia Preprocedure Evaluation (Addendum)
Anesthesia Evaluation  Patient identified by MRN, date of birth, ID band Patient awake    Reviewed: Allergy & Precautions, NPO status , Patient's Chart, lab work & pertinent test results  Airway Mallampati: III  TM Distance: <3 FB Neck ROM: Full    Dental   Pulmonary sleep apnea ,    breath sounds clear to auscultation       Cardiovascular hypertension, Pt. on medications and Pt. on home beta blockers + CAD, + Past MI and + CABG   Rhythm:Regular Rate:Normal     Neuro/Psych PSYCHIATRIC DISORDERS Anxiety Depression Dementia CVA    GI/Hepatic negative GI ROS, Neg liver ROS,   Endo/Other  diabetes, Type 2, Oral Hypoglycemic Agents  Renal/GU negative Renal ROS     Musculoskeletal  (+) Arthritis ,   Abdominal (+) + obese,   Peds  Hematology   Anesthesia Other Findings   Reproductive/Obstetrics                          Anesthesia Physical Anesthesia Plan  ASA: 3  Anesthesia Plan: General   Post-op Pain Management: Regional block*   Induction: Intravenous  PONV Risk Score and Plan: 3 and Ondansetron and Treatment may vary due to age or medical condition  Airway Management Planned: Oral ETT  Additional Equipment: None  Intra-op Plan:   Post-operative Plan: Extubation in OR  Informed Consent: I have reviewed the patients History and Physical, chart, labs and discussed the procedure including the risks, benefits and alternatives for the proposed anesthesia with the patient or authorized representative who has indicated his/her understanding and acceptance.     Dental advisory given  Plan Discussed with: CRNA  Anesthesia Plan Comments: (See PAT note 06/14/2022)       Anesthesia Quick Evaluation

## 2022-06-19 NOTE — Progress Notes (Signed)
Anesthesia Chart Review   Case: [id number] Date/Time: 06/21/22 0945   Procedure: REVERSE SHOULDER ARTHROPLASTY (Left: Shoulder) -   Anesthesia type: General   Pre-op diagnosis: Left shoulder rotator cuff tear arthropathy   Location: [facility] ROOM 06 / [facility]   Surgeons: Francena Hanly, MD       DISCUSSION:81 y.o. never smoker with h/o HTN, sleep apnea with CPAP, stroke, CAD (CABG), PAF, DM II, left shoulder OA scheduled for above procedure 06/21/2022 with Dr. Francena Hanly.   Pt seen by cardiology 06/15/2022. Per OV note, "Preoperative cardiac evaluation: Scheduled for shoulder surgery 06/21/2022.  She missed her preoperative clearance appointment several weeks ago. Is in significant pain. Will get echocardiogram prior to providing cardiac clearance. She can achieve > 4 METS activity without concerning symptoms. Her RCRI for MACE is Class IV, 11%.  Clearance to hold Eliquis for 3 days prior to shoulder surgery given by Dr. Flora Lipps. Addendum: echo results reviewed. She may proceed to surgery without further cardiac testing. "  Anticipate pt can proceed with planned procedure barring acute status change.   VS: BP 126/62   Pulse 79   Temp 37.1 C (Oral)   Resp 18   Ht 5' (1.524 m)   Wt 78.5 kg   SpO2 96%   BMI 33.79 kg/m   PROVIDERS: Melida Quitter, MD is PCP   Cardiologist:  Reatha Harps, MD      LABS: Labs reviewed: Acceptable for surgery. (all labs ordered are listed, but only abnormal results are displayed)  Labs Reviewed  SURGICAL PCR SCREEN - Abnormal; Notable for the following components:      Result Value   Staphylococcus aureus POSITIVE (*)    All other components within normal limits  GLUCOSE, CAPILLARY - Abnormal; Notable for the following components:   Glucose-Capillary 213 (*)    All other components within normal limits  HEMOGLOBIN A1C - Abnormal; Notable for the following components:   Hgb A1c MFr Bld 7.5 (*)    All other components within normal limits  BASIC  METABOLIC PANEL - Abnormal; Notable for the following components:   Glucose, Bld 175 (*)    BUN 25 (*)    Creatinine, Ser 1.40 (*)    GFR, Estimated 38 (*)    All other components within normal limits  CBC - Abnormal; Notable for the following components:   Hemoglobin 15.4 (*)    HCT 46.3 (*)    All other components within normal limits     IMAGES:   EKG:   CV: Echo 06/18/2022 1. Compared to echo report from March 2021, LVEF is improved.   2. Global longitudinal strain is normal at -23.3%. Left ventricular  ejection fraction, by estimation, is 60 to 65%. The left ventricle has  normal function. The left ventricle has no regional wall motion  abnormalities. There is mild left ventricular  hypertrophy. Left ventricular diastolic parameters are indeterminate.   3. Right ventricular systolic function is normal. The right ventricular  size is normal.   4. The mitral valve is normal in structure. Mild mitral valve  regurgitation.   5. The aortic valve is normal in structure. Aortic valve regurgitation is  not visualized.   6. The inferior vena cava is normal in size with greater than 50%  respiratory variability, suggesting right atrial pressure of 3 mmHg.  Past Medical History:  Diagnosis Date   Anxiety    Arthritis    Back pain    Coronary artery disease  Depression    Diabetes ([place])    Dyspnea    Gout    Heart attack [facility])    March 2021   History of kidney stones    Hyperlipidemia    Hypertension    Leg edema    Pancreatitis, chronic ([place]) 03/2016   Pneumonia due to COVID-19 virus 12/03/2019   Sleep apnea    C-PAP   Stroke [facility])    March 2021 Rt side weakness    Past Surgical History:  Procedure Laterality Date   BACK SURGERY  2004   BREAST LUMPECTOMY WITH RADIOACTIVE SEED LOCALIZATION Right 10/23/2019   Procedure: RIGHT BREAST LUMPECTOMY WITH RADIOACTIVE SEED LOCALIZATION;  Surgeon: Jovita Kussmaul, MD;  Location: [place];  Service: General;  Laterality:  Right;   CHOLECYSTECTOMY  2001   cardiac event during surgery   COLONOSCOPY  2013   CORONARY ARTERY BYPASS GRAFT N/A 03/03/2020   Procedure: CORONARY ARTERY BYPASS GRAFTING (CABG), ON PUMP, TIMES FIVE, USING LEFT INTERNAL MAMMARY ARTERY AND ENDOSCOPICALLY HARVESTED BILATERAL GREATER SAPHENOUS VEINS -flow track only;  Surgeon: Lajuana Matte, MD;  Location: [place];  Service: Open Heart Surgery;  Laterality: N/A;   LEFT HEART CATH AND CORONARY ANGIOGRAPHY N/A 03/01/2020   Procedure: LEFT HEART CATH AND CORONARY ANGIOGRAPHY;  Surgeon: Belva Crome, MD;  Location: [place] CV LAB;  Service: Cardiovascular;  Laterality: N/A;   TEE WITHOUT CARDIOVERSION N/A 03/03/2020   Procedure: TRANSESOPHAGEAL ECHOCARDIOGRAM (TEE);  Surgeon: Lajuana Matte, MD;  Location: [place];  Service: Open Heart Surgery;  Laterality: N/A;    MEDICATIONS:  Accu-Chek Softclix Lancets lancets   aspirin EC 81 MG tablet   atorvastatin (LIPITOR) 20 MG tablet   Cyanocobalamin (B-12 COMPLIANCE INJECTION) 1000 MCG/ML KIT   DULoxetine (CYMBALTA) 60 MG capsule   ELIQUIS 5 MG TABS tablet   empagliflozin (JARDIANCE) 25 MG TABS tablet   furosemide (LASIX) 20 MG tablet   glimepiride (AMARYL) 1 MG tablet   glucose blood (ACCU-CHEK AVIVA) test strip   losartan (COZAAR) 25 MG tablet   metFORMIN (GLUCOPHAGE-XR) 750 MG 24 hr tablet   metoprolol tartrate (LOPRESSOR) 25 MG tablet   NON FORMULARY   traMADol (ULTRAM) 50 MG tablet   traZODone (DESYREL) 100 MG tablet   No current facility-administered medications for this encounter.    Konrad Felix Ward, PA-C [facility] [phone]

## 2022-06-21 ENCOUNTER — Ambulatory Visit (HOSPITAL_COMMUNITY)
Admission: RE | Admit: 2022-06-21 | Discharge: 2022-06-21 | Disposition: A | Discharge status: Home / Self Care | Payer: Medicare Other | Attending: Orthopedic Surgery | Admitting: Orthopedic Surgery

## 2022-06-21 ENCOUNTER — Encounter (HOSPITAL_COMMUNITY): Payer: Self-pay | Admitting: Orthopedic Surgery

## 2022-06-21 ENCOUNTER — Other Ambulatory Visit: Payer: Self-pay

## 2022-06-21 ENCOUNTER — Ambulatory Visit (HOSPITAL_BASED_OUTPATIENT_CLINIC_OR_DEPARTMENT_OTHER): Payer: Medicare Other | Admitting: Certified Registered Nurse Anesthetist

## 2022-06-21 ENCOUNTER — Encounter (HOSPITAL_COMMUNITY)
Admission: RE | Disposition: A | Discharge status: Home / Self Care | Payer: Self-pay | Source: Home / Self Care | Attending: Orthopedic Surgery

## 2022-06-21 ENCOUNTER — Ambulatory Visit (HOSPITAL_COMMUNITY): Payer: Medicare Other | Admitting: Physician Assistant

## 2022-06-21 DIAGNOSIS — M1990 Unspecified osteoarthritis, unspecified site: Secondary | ICD-10-CM | POA: Insufficient documentation

## 2022-06-21 DIAGNOSIS — Z01818 Encounter for other preprocedural examination: Secondary | ICD-10-CM

## 2022-06-21 DIAGNOSIS — M75102 Unspecified rotator cuff tear or rupture of left shoulder, not specified as traumatic: Secondary | ICD-10-CM | POA: Diagnosis present

## 2022-06-21 DIAGNOSIS — I2510 Atherosclerotic heart disease of native coronary artery without angina pectoris: Secondary | ICD-10-CM | POA: Insufficient documentation

## 2022-06-21 DIAGNOSIS — F0390 Unspecified dementia without behavioral disturbance: Secondary | ICD-10-CM | POA: Diagnosis not present

## 2022-06-21 DIAGNOSIS — Z7984 Long term (current) use of oral hypoglycemic drugs: Secondary | ICD-10-CM | POA: Diagnosis not present

## 2022-06-21 DIAGNOSIS — Z951 Presence of aortocoronary bypass graft: Secondary | ICD-10-CM | POA: Insufficient documentation

## 2022-06-21 DIAGNOSIS — I10 Essential (primary) hypertension: Secondary | ICD-10-CM | POA: Diagnosis not present

## 2022-06-21 DIAGNOSIS — Z8673 Personal history of transient ischemic attack (TIA), and cerebral infarction without residual deficits: Secondary | ICD-10-CM | POA: Diagnosis not present

## 2022-06-21 DIAGNOSIS — Z79899 Other long term (current) drug therapy: Secondary | ICD-10-CM | POA: Insufficient documentation

## 2022-06-21 DIAGNOSIS — I252 Old myocardial infarction: Secondary | ICD-10-CM

## 2022-06-21 DIAGNOSIS — M12812 Other specific arthropathies, not elsewhere classified, left shoulder: Secondary | ICD-10-CM | POA: Diagnosis not present

## 2022-06-21 DIAGNOSIS — F32A Depression, unspecified: Secondary | ICD-10-CM | POA: Insufficient documentation

## 2022-06-21 DIAGNOSIS — E119 Type 2 diabetes mellitus without complications: Secondary | ICD-10-CM | POA: Insufficient documentation

## 2022-06-21 DIAGNOSIS — Z794 Long term (current) use of insulin: Secondary | ICD-10-CM

## 2022-06-21 DIAGNOSIS — G4730 Sleep apnea, unspecified: Secondary | ICD-10-CM | POA: Diagnosis not present

## 2022-06-21 HISTORY — PX: REVERSE SHOULDER ARTHROPLASTY: SHX5054

## 2022-06-21 LAB — GLUCOSE, CAPILLARY
Glucose-Capillary: 137 mg/dL — ABNORMAL HIGH (ref 70–99)
Glucose-Capillary: 129 mg/dL — ABNORMAL HIGH (ref 70–99)

## 2022-06-21 SURGERY — ARTHROPLASTY, SHOULDER, TOTAL, REVERSE
Anesthesia: General | Site: Shoulder | Laterality: Left

## 2022-06-21 MED ORDER — 0.9 % SODIUM CHLORIDE (POUR BTL) OPTIME
TOPICAL | Status: DC | PRN
  Administered 2022-06-21: 1000 mL

## 2022-06-21 MED ORDER — OXYCODONE-ACETAMINOPHEN 5-325 MG PO TABS: 1.0000 | ORAL_TABLET | ORAL | 0 refills | Status: DC | PRN

## 2022-06-21 MED ORDER — SUGAMMADEX SODIUM 500 MG/5ML IV SOLN
INTRAVENOUS | Status: AC
Start: 2022-06-21 — End: ?
  Filled 2022-06-21: qty 5

## 2022-06-21 MED ORDER — FENTANYL CITRATE (PF) 100 MCG/2ML IJ SOLN
INTRAMUSCULAR | Status: DC | PRN
  Administered 2022-06-21 (×4): 25 ug via INTRAVENOUS

## 2022-06-21 MED ORDER — ROCURONIUM BROMIDE 10 MG/ML (PF) SYRINGE
PREFILLED_SYRINGE | INTRAVENOUS | Status: AC
  Filled 2022-06-21: qty 10

## 2022-06-21 MED ORDER — VANCOMYCIN HCL 1000 MG IV SOLR
INTRAVENOUS | Status: AC
  Filled 2022-06-21: qty 20

## 2022-06-21 MED ORDER — PHENYLEPHRINE 80 MCG/ML (10ML) SYRINGE FOR IV PUSH (FOR BLOOD PRESSURE SUPPORT)
PREFILLED_SYRINGE | INTRAVENOUS | Status: DC | PRN
  Administered 2022-06-21: 35 ug via INTRAVENOUS

## 2022-06-21 MED ORDER — ONDANSETRON HCL 4 MG PO TABS: 4.0000 mg | ORAL_TABLET | Freq: Three times a day (TID) | ORAL | 0 refills | Status: DC | PRN

## 2022-06-21 MED ORDER — VANCOMYCIN HCL 1000 MG IV SOLR
INTRAVENOUS | Status: DC | PRN
  Administered 2022-06-21: 1000 mg via TOPICAL

## 2022-06-21 MED ORDER — GLYCOPYRROLATE 0.2 MG/ML IJ SOLN
INTRAMUSCULAR | Status: DC | PRN
  Administered 2022-06-21 (×2): .1 mg via INTRAVENOUS

## 2022-06-21 MED ORDER — LIDOCAINE HCL (PF) 2 % IJ SOLN
INTRAMUSCULAR | Status: AC
  Filled 2022-06-21: qty 5

## 2022-06-21 MED ORDER — TRANEXAMIC ACID-NACL 1000-0.7 MG/100ML-% IV SOLN
1000.0000 mg | Freq: Once | INTRAVENOUS | Status: AC
  Administered 2022-06-21: 1000 mg via INTRAVENOUS
  Filled 2022-06-21: qty 100

## 2022-06-21 MED ORDER — MENTHOL 3 MG MT LOZG
1.0000 | LOZENGE | OROMUCOSAL | Status: DC | PRN
  Filled 2022-06-21: qty 9

## 2022-06-21 MED ORDER — ACETAMINOPHEN 10 MG/ML IV SOLN
1000.0000 mg | Freq: Once | INTRAVENOUS | Status: DC | PRN
  Administered 2022-06-21: 1000 mg via INTRAVENOUS

## 2022-06-21 MED ORDER — ACETAMINOPHEN 325 MG PO TABS: 325.0000 mg | ORAL_TABLET | ORAL | Status: DC | PRN

## 2022-06-21 MED ORDER — BUPIVACAINE LIPOSOME 1.3 % IJ SUSP
INTRAMUSCULAR | Status: DC | PRN
  Administered 2022-06-21: 10 mL via PERINEURAL

## 2022-06-21 MED ORDER — LACTATED RINGERS IV BOLUS
250.0000 mL | Freq: Once | INTRAVENOUS | Status: AC
  Administered 2022-06-21: 250 mL via INTRAVENOUS

## 2022-06-21 MED ORDER — METOCLOPRAMIDE HCL 5 MG/ML IJ SOLN: 5.0000 mg | Freq: Three times a day (TID) | INTRAMUSCULAR | Status: DC | PRN

## 2022-06-21 MED ORDER — OXYCODONE HCL 5 MG PO TABS
5.0000 mg | ORAL_TABLET | Freq: Once | ORAL | Status: AC | PRN
  Administered 2022-06-21: 5 mg via ORAL

## 2022-06-21 MED ORDER — LACTATED RINGERS IV BOLUS
500.0000 mL | Freq: Once | INTRAVENOUS | Status: AC
  Administered 2022-06-21: 500 mL via INTRAVENOUS

## 2022-06-21 MED ORDER — METOCLOPRAMIDE HCL 5 MG PO TABS: 5.0000 mg | ORAL_TABLET | Freq: Three times a day (TID) | ORAL | Status: DC | PRN

## 2022-06-21 MED ORDER — FENTANYL CITRATE PF 50 MCG/ML IJ SOSY
PREFILLED_SYRINGE | INTRAMUSCULAR | Status: AC
  Filled 2022-06-21: qty 1

## 2022-06-21 MED ORDER — FENTANYL CITRATE PF 50 MCG/ML IJ SOSY: 25.0000 ug | PREFILLED_SYRINGE | Freq: Once | INTRAMUSCULAR | Status: AC

## 2022-06-21 MED ORDER — MIDAZOLAM HCL 2 MG/2ML IJ SOLN
INTRAMUSCULAR | Status: AC
  Filled 2022-06-21: qty 2

## 2022-06-21 MED ORDER — PHENYLEPHRINE HCL-NACL 20-0.9 MG/250ML-% IV SOLN
INTRAVENOUS | Status: DC | PRN
  Administered 2022-06-21: 35 ug/min via INTRAVENOUS

## 2022-06-21 MED ORDER — ACETAMINOPHEN 10 MG/ML IV SOLN
INTRAVENOUS | Status: AC
  Filled 2022-06-21: qty 100

## 2022-06-21 MED ORDER — PROPOFOL 10 MG/ML IV BOLUS
INTRAVENOUS | Status: AC
  Filled 2022-06-21: qty 20

## 2022-06-21 MED ORDER — TRANEXAMIC ACID 1000 MG/10ML IV SOLN: 1000.0000 mg | INTRAVENOUS | Status: DC

## 2022-06-21 MED ORDER — ONDANSETRON HCL 4 MG/2ML IJ SOLN: 4.0000 mg | Freq: Four times a day (QID) | INTRAMUSCULAR | Status: DC | PRN

## 2022-06-21 MED ORDER — ACETAMINOPHEN 160 MG/5ML PO SOLN: 325.0000 mg | ORAL | Status: DC | PRN

## 2022-06-21 MED ORDER — ONDANSETRON HCL 4 MG/2ML IJ SOLN
INTRAMUSCULAR | Status: AC
  Filled 2022-06-21: qty 2

## 2022-06-21 MED ORDER — PROPOFOL 10 MG/ML IV BOLUS
INTRAVENOUS | Status: DC | PRN
  Administered 2022-06-21: 110 mg via INTRAVENOUS

## 2022-06-21 MED ORDER — AMISULPRIDE (ANTIEMETIC) 5 MG/2ML IV SOLN: 10.0000 mg | Freq: Once | INTRAVENOUS | Status: DC | PRN

## 2022-06-21 MED ORDER — CHLORHEXIDINE GLUCONATE 0.12 % MT SOLN: 15.0000 mL | Freq: Once | OROMUCOSAL | Status: AC

## 2022-06-21 MED ORDER — BUPIVACAINE HCL (PF) 0.5 % IJ SOLN
INTRAMUSCULAR | Status: DC | PRN
  Administered 2022-06-21: 12 mL via PERINEURAL

## 2022-06-21 MED ORDER — OXYCODONE HCL 5 MG PO TABS
ORAL_TABLET | ORAL | Status: AC
  Filled 2022-06-21: qty 1

## 2022-06-21 MED ORDER — OXYCODONE HCL 5 MG/5ML PO SOLN: 5.0000 mg | Freq: Once | ORAL | Status: AC | PRN

## 2022-06-21 MED ORDER — FENTANYL CITRATE PF 50 MCG/ML IJ SOSY
25.0000 ug | PREFILLED_SYRINGE | INTRAMUSCULAR | Status: DC | PRN
  Administered 2022-06-21 (×2): 25 ug via INTRAVENOUS

## 2022-06-21 MED ORDER — ONDANSETRON HCL 4 MG/2ML IJ SOLN
INTRAMUSCULAR | Status: DC | PRN
  Administered 2022-06-21: 4 mg via INTRAVENOUS

## 2022-06-21 MED ORDER — DEXAMETHASONE SODIUM PHOSPHATE 10 MG/ML IJ SOLN
INTRAMUSCULAR | Status: AC
  Filled 2022-06-21: qty 1

## 2022-06-21 MED ORDER — FENTANYL CITRATE PF 50 MCG/ML IJ SOSY
PREFILLED_SYRINGE | INTRAMUSCULAR | Status: AC
  Administered 2022-06-21: 25 ug via INTRAVENOUS
  Filled 2022-06-21: qty 2

## 2022-06-21 MED ORDER — CEFAZOLIN SODIUM-DEXTROSE 2-4 GM/100ML-% IV SOLN
2.0000 g | INTRAVENOUS | Status: AC
  Administered 2022-06-21: 2 g via INTRAVENOUS
  Filled 2022-06-21: qty 100

## 2022-06-21 MED ORDER — PHENYLEPHRINE HCL-NACL 20-0.9 MG/250ML-% IV SOLN
INTRAVENOUS | Status: AC
  Filled 2022-06-21: qty 250

## 2022-06-21 MED ORDER — PROMETHAZINE HCL 25 MG/ML IJ SOLN: 6.2500 mg | INTRAMUSCULAR | Status: DC | PRN

## 2022-06-21 MED ORDER — ONDANSETRON HCL 4 MG PO TABS: 4.0000 mg | ORAL_TABLET | Freq: Four times a day (QID) | ORAL | Status: DC | PRN

## 2022-06-21 MED ORDER — FENTANYL CITRATE (PF) 100 MCG/2ML IJ SOLN
INTRAMUSCULAR | Status: AC
  Filled 2022-06-21: qty 2

## 2022-06-21 MED ORDER — DEXAMETHASONE SODIUM PHOSPHATE 4 MG/ML IJ SOLN
INTRAMUSCULAR | Status: DC | PRN
  Administered 2022-06-21: 8 mg via INTRAVENOUS

## 2022-06-21 MED ORDER — TIZANIDINE HCL 4 MG PO TABS: 4.0000 mg | ORAL_TABLET | Freq: Three times a day (TID) | ORAL | 1 refills | Status: DC | PRN

## 2022-06-21 MED ORDER — LACTATED RINGERS IV SOLN
INTRAVENOUS | Status: DC
Start: 2022-06-21 — End: 2022-06-21

## 2022-06-21 MED ORDER — ORAL CARE MOUTH RINSE
15.0000 mL | Freq: Once | OROMUCOSAL | Status: AC
  Administered 2022-06-21: 15 mL via OROMUCOSAL

## 2022-06-21 MED ORDER — SUGAMMADEX SODIUM 500 MG/5ML IV SOLN
INTRAVENOUS | Status: DC | PRN
  Administered 2022-06-21: 100 mg via INTRAVENOUS
  Administered 2022-06-21: 300 mg via INTRAVENOUS

## 2022-06-21 MED ORDER — LIDOCAINE 2% (20 MG/ML) 5 ML SYRINGE
INTRAMUSCULAR | Status: DC | PRN
  Administered 2022-06-21: 40 mg via INTRAVENOUS

## 2022-06-21 MED ORDER — ROCURONIUM BROMIDE 10 MG/ML (PF) SYRINGE
PREFILLED_SYRINGE | INTRAVENOUS | Status: DC | PRN
  Administered 2022-06-21: 10 mg via INTRAVENOUS
  Administered 2022-06-21: 60 mg via INTRAVENOUS

## 2022-06-21 MED ORDER — PHENOL 1.4 % MT LIQD
1.0000 | OROMUCOSAL | Status: DC | PRN
  Filled 2022-06-21: qty 177

## 2022-06-21 MED ORDER — STERILE WATER FOR IRRIGATION IR SOLN
Status: DC | PRN
  Administered 2022-06-21: 2000 mL

## 2022-06-21 SURGICAL SUPPLY — 75 items
BAG COUNTER SPONGE SURGICOUNT (BAG) ×1 IMPLANT
BAG ZIPLOCK 12X15 (MISCELLANEOUS) ×2 IMPLANT
BLADE SAW SGTL 83.5X18.5 (BLADE) ×2 IMPLANT
BNDG COHESIVE 4X5 TAN STRL LF (GAUZE/BANDAGES/DRESSINGS) ×2 IMPLANT
COOLER ICEMAN CLASSIC (MISCELLANEOUS) ×2 IMPLANT
COVER BACK TABLE 60X90IN (DRAPES) ×2 IMPLANT
COVER SURGICAL LIGHT HANDLE (MISCELLANEOUS) ×2 IMPLANT
CUP SUT UNIV REVERS 36 NEUTRAL (Cup) ×1 IMPLANT
DERMABOND ADVANCED (GAUZE/BANDAGES/DRESSINGS) ×1
DERMABOND ADVANCED .7 DNX12 (GAUZE/BANDAGES/DRESSINGS) ×1 IMPLANT
DRAPE INCISE IOBAN 66X45 STRL (DRAPES) IMPLANT
DRAPE ORTHO SPLIT 77X108 STRL (DRAPES) ×4
DRAPE SHEET LG 3/4 BI-LAMINATE (DRAPES) ×2 IMPLANT
DRAPE SURG 17X11 SM STRL (DRAPES) ×2 IMPLANT
DRAPE SURG ORHT 6 SPLT 77X108 (DRAPES) ×2 IMPLANT
DRAPE TOP 10253 STERILE (DRAPES) ×2 IMPLANT
DRAPE U-SHAPE 47X51 STRL (DRAPES) ×2 IMPLANT
DRESSING AQUACEL AG SP 3.5X6 (GAUZE/BANDAGES/DRESSINGS) ×1 IMPLANT
DRSG AQUACEL AG ADV 3.5X10 (GAUZE/BANDAGES/DRESSINGS) IMPLANT
DRSG AQUACEL AG SP 3.5X6 (GAUZE/BANDAGES/DRESSINGS) ×2
DRSG TEGADERM 8X12 (GAUZE/BANDAGES/DRESSINGS) ×2 IMPLANT
DURAPREP 26ML APPLICATOR (WOUND CARE) ×2 IMPLANT
ELECT BLADE TIP CTD 4 INCH (ELECTRODE) ×2 IMPLANT
ELECT PENCIL ROCKER SW 15FT (MISCELLANEOUS) ×2 IMPLANT
ELECT REM PT RETURN 15FT ADLT (MISCELLANEOUS) ×2 IMPLANT
FACESHIELD WRAPAROUND (MASK) ×8 IMPLANT
FACESHIELD WRAPAROUND OR TEAM (MASK) ×4 IMPLANT
FIBERTAPE CERCLAGE TLINK SUT (SUTURE) ×1 IMPLANT
GLENOID UNI REV MOD 24 +2 LAT (Joint) ×1 IMPLANT
GLENOSPHERE 36 +4 LAT/24 (Joint) ×1 IMPLANT
GLOVE BIO SURGEON STRL SZ7.5 (GLOVE) ×2 IMPLANT
GLOVE BIO SURGEON STRL SZ8 (GLOVE) ×2 IMPLANT
GLOVE SS BIOGEL STRL SZ 7 (GLOVE) ×1 IMPLANT
GLOVE SS BIOGEL STRL SZ 7.5 (GLOVE) ×1 IMPLANT
GLOVE SUPERSENSE BIOGEL SZ 7 (GLOVE) ×1
GLOVE SUPERSENSE BIOGEL SZ 7.5 (GLOVE) ×1
GOWN STRL REIN XL XLG (GOWN DISPOSABLE) ×4 IMPLANT
KIT BASIN OR (CUSTOM PROCEDURE TRAY) ×2 IMPLANT
KIT TURNOVER KIT A (KITS) ×1 IMPLANT
LINER HUMERAL 36 +3MM SM (Shoulder) ×1 IMPLANT
MANIFOLD NEPTUNE II (INSTRUMENTS) ×2 IMPLANT
NDL TAPERED W/ NITINOL LOOP (MISCELLANEOUS) ×1 IMPLANT
NEEDLE TAPERED W/ NITINOL LOOP (MISCELLANEOUS) ×2 IMPLANT
NS IRRIG 1000ML POUR BTL (IV SOLUTION) ×2 IMPLANT
PACK SHOULDER (CUSTOM PROCEDURE TRAY) ×2 IMPLANT
PAD ARMBOARD 7.5X6 YLW CONV (MISCELLANEOUS) ×2 IMPLANT
PAD COLD SHLDR WRAP-ON (PAD) ×2 IMPLANT
PIN NITINOL TARGETER 2.8 (PIN) IMPLANT
PIN SET MODULAR GLENOID SYSTEM (PIN) ×1 IMPLANT
RESTRAINT HEAD UNIVERSAL NS (MISCELLANEOUS) ×2 IMPLANT
SCREW CENTRAL MODULAR 25 (Screw) ×1 IMPLANT
SCREW PERI LOCK 5.5X16 (Screw) ×1 IMPLANT
SCREW PERI LOCK 5.5X32 (Screw) ×2 IMPLANT
SCREW PERIPHERAL 5.5X20 LOCK (Screw) ×1 IMPLANT
SLING ARM FOAM STRAP LRG (SOFTGOODS) ×1 IMPLANT
SLING ARM FOAM STRAP MED (SOFTGOODS) IMPLANT
SPONGE T-LAP 18X18 ~~LOC~~+RFID (SPONGE) ×1 IMPLANT
SPONGE T-LAP 4X18 ~~LOC~~+RFID (SPONGE) ×2 IMPLANT
STEM HUMERAL MOD SZ 5 135 DEG (Stem) ×1 IMPLANT
STEM HUMERAL UNI REVERS SZ6 (Stem) ×1 IMPLANT
SUCTION FRAZIER HANDLE 12FR (TUBING) ×2
SUCTION TUBE FRAZIER 12FR DISP (TUBING) ×1 IMPLANT
SUT FIBERWIRE #2 38 T-5 BLUE (SUTURE)
SUT MNCRL AB 3-0 PS2 18 (SUTURE) ×2 IMPLANT
SUT MON AB 2-0 CT1 36 (SUTURE) ×2 IMPLANT
SUT VIC AB 1 CT1 36 (SUTURE) ×2 IMPLANT
SUTURE FIBERWR #2 38 T-5 BLUE (SUTURE) IMPLANT
SUTURE TAPE 1.3 40 TPR END (SUTURE) ×2 IMPLANT
SUTURETAPE 1.3 40 TPR END (SUTURE) ×4
TOWEL OR 17X26 10 PK STRL BLUE (TOWEL DISPOSABLE) ×2 IMPLANT
TOWEL OR NON WOVEN STRL DISP B (DISPOSABLE) ×2 IMPLANT
TUBE SUCTION HIGH CAP CLEAR NV (SUCTIONS) ×2 IMPLANT
WATER STERILE IRR 1000ML POUR (IV SOLUTION) ×4 IMPLANT
YANKAUER SUCT BULB TIP 10FT TU (MISCELLANEOUS) IMPLANT
fibertape cerclage 2mm, 48" with tigerlink shuttle ×1 IMPLANT

## 2022-06-21 NOTE — Op Note (Signed)
06/21/2022  12:04 PM  PATIENT:   Judy [name]  81 y.o. female  PRE-OPERATIVE DIAGNOSIS:  Left shoulder rotator cuff tear arthropathy  POST-OPERATIVE DIAGNOSIS: Same  PROCEDURE: Left shoulder reverse arthroplasty lysing a press-fit size 6 Arthrex stem with a neutral metaphysis, +3 polyethylene insert, 36/+4 glenosphere and a small/+2 baseplate  SURGEON:  Andoni Busch, Metta Clines M.D.  ASSISTANTS: Jenetta Loges, PA-C  ANESTHESIA:   General endotracheal and interscalene block with Exparel  EBL: 200 cc  SPECIMEN: None  Drains: None   PATIENT DISPOSITION:  PACU - hemodynamically stable.    PLAN OF CARE: Discharge to home after PACU  Brief history:  Patient is a 81 year old female with a number of significant underlying medical comorbidities who has been followed for chronic severe left shoulder pain.  Her symptoms have been refractory to prolonged attempts at conservative management, and she is brought to the operating room at this time for planned left shoulder reverse arthroplasty.  Preoperatively, I counseled the patient regarding treatment options and risks versus benefits thereof.  Possible surgical complications were all reviewed including potential for bleeding, infection, neurovascular injury, persistent pain, loss of motion, anesthetic complication, failure of the implant, and possible need for additional surgery. They understand and accept and agrees with our planned procedure.   Procedure in detail:  After undergoing routine preop evaluation the patient received prophylactic antibiotics and interscalene block with Exparel was established in the holding area by the anesthesia department.  Patient was subsequently placed spine on the operating table and underwent the smooth induction of a general endotracheal anesthesia.  Placed into the beachchair position and appropriately padded and protected.  The left shoulder girdle region was sterilely prepped and draped in standard  fashion.  Timeout was called.  A deltopectoral approach left shoulder is made an approximately 10 cm incision.  Skin flaps were elevated dissection carried deeply and the deltopectoral interval was developed from proximal to distal with the vein taken laterally.  The conjoined tendon was mobilized and retracted medially.  The upper centimeter the pectoralis major was tenotomized for exposure.  The long head biceps tendon was then tenodesed at the upper border the pectoralis major tendon and the proximal segment was unroofed and excised.  The rotator cuff was then split along the rotator interval to the base of the coracoid and the subscap was separated from the lesser tuberosity using electrocautery and the free margin was tagged with a pair of suture tape sutures.  Capsular attachments were then divided from the anterior and inferior margins of the humeral neck and humeral head was then delivered through the wound.  Extra medullary guide used to outline the proposed humeral head resection which we performed with an oscillating saw at approximately 20 degrees retroversion.  A metal cap was then placed over the cut proximal humeral surface we then exposed the glenoid and performed a circumferential labral resection.  A guidepin was then directed into the center of the glenoid and the glenoid was then reamed with the central followed by the peripheral reamer for a small baseplate.  Preparation completed with the central drill and tapped for a 25 mm lag screw.  Our baseplate was then assembled and inserted with vancomycin powder applied to the threads of the lag screw and excellent purchase and fixation was achieved.  All of the peripheral locking screws were then placed using standard technique.  A 36/+4 glenosphere was then impacted onto the baseplate and the central locking screw was placed.  We then returned  our attention back to the humeral metaphysis where the canal was opened and we did find the canal to be  quite narrow.  We ultimately broached to a size 5 stem and performed preparation of the metaphysis and then performed a trial reduction and did not find that we could successfully reduce the joint.  With this we went back and we broached the humeral metaphysis and ultimately felt that a size 5 stem would be appropriate.  Trial reduction was completed.  At this point we assembled our final stem size 5 and this was then impacted but we were unable to achieve good stability of the implant.  With this finding we assessed the humeral canal and found that we needed a larger stem to achieve appropriate fixation in the metaphyseal diaphyseal junction.  We then seated a size 6 broach and then performed once again a trial reduction with a size 6 gave [name] good rotational stability within the humeral canal and we also ultimately were able to achieve a reduction although we did consider utilizing a size 33 glenosphere was then trialing that implant combination.  Ultimately we felt that the +4 glenosphere would be optimal and as far as lateralization and the size 6 stem ultimately gave [name] good purchase and rotational stability within the humeral canal and so we upsized our stem to a 6 and utilized the previously open metaphysis.  This implant was assembled with good fixation.  The original size 36+4 glenosphere was then impacted back onto the baseplate and the central locking screw was placed.  We then terminally seated the size 6 stem after we had placed a fiber cerclage around the metaphysis to help protect the metaphyseal bone.  Final seating of the implant was then performed at this time we were able to achieve an appropriate reduction.  Our trial was then removed to use a +3 poly which was impacted onto the implant after was cleaned and dried.  A final reduction showed excellent motion good stability and good soft tissue balance all much to our satisfaction.  We then reassessed the subscapularis and felt that it did not have  good elasticity so we did not repair the subscapularis.  Wound was copiously irrigated.  Hemostasis was obtained.  The balance of the vancomycin powder was then spread liberally throughout the deep soft tissue planes.  The deltopectoral interval was reapproximated a series of figure-of-eight number Vicryl sutures.  2-0 Monocryl used to close the subcu layer and intracuticular 3 Monocryl used to close the skin followed by Dermabond and Aquacel dressing.  Left arm placed into a sling.  The patient was then awakened, extubated, and taken to the recovery room in stable condition.  Jenetta Loges, PA-C was utilized as an Environmental consultant throughout this case, essential for help with positioning the patient, positioning extremity, tissue manipulation, implantation of the prosthesis, suture management, wound closure, and intraoperative decision-making.  Marin Shutter MD   Contact # [phone]

## 2022-06-21 NOTE — H&P (Signed)
[name]    Chief Complaint: Left shoulder rotator cuff tear arthropathy HPI: The patient is a 81 y.o. female with chronic and progressively increasing left shoulder pain related to severe rotator cuff tear arthropathy.  Due to her increasing functional limitations and failure to respond to prolonged attempts at conservative management, she is brought to the operating room at this time for planned left shoulder reverse arthroplasty.  Patient does have multiple significant underlying medical comorbidities including coronary artery disease status post CABG x5 with recent cardiology work-up which is included in the adjacent medical record.  She has received cardiology clearance for the above outlined procedure with risk factors noted as her preop evaluation.  Past Medical History:  Diagnosis Date   Anxiety    Arthritis    Back pain    Coronary artery disease    Depression    Diabetes ([place])    Dyspnea    Gout    Heart attack [facility])    March 2021   History of kidney stones    Hyperlipidemia    Hypertension    Leg edema    Pancreatitis, chronic ([place]) 03/2016   Pneumonia due to COVID-19 virus 12/03/2019   Sleep apnea    C-PAP   Stroke [facility])    March 2021 Rt side weakness      Past Surgical History:  Procedure Laterality Date   BACK SURGERY  2004   BREAST LUMPECTOMY WITH RADIOACTIVE SEED LOCALIZATION Right 10/23/2019   Procedure: RIGHT BREAST LUMPECTOMY WITH RADIOACTIVE SEED LOCALIZATION;  Surgeon: Jovita Kussmaul, MD;  Location: [place];  Service: General;  Laterality: Right;   CHOLECYSTECTOMY  2001   cardiac event during surgery   COLONOSCOPY  2013   CORONARY ARTERY BYPASS GRAFT N/A 03/03/2020   Procedure: CORONARY ARTERY BYPASS GRAFTING (CABG), ON PUMP, TIMES FIVE, USING LEFT INTERNAL MAMMARY ARTERY AND ENDOSCOPICALLY HARVESTED BILATERAL GREATER SAPHENOUS VEINS -flow track only;  Surgeon: Lajuana Matte, MD;  Location: [place];  Service: Open Heart Surgery;  Laterality:  N/A;   LEFT HEART CATH AND CORONARY ANGIOGRAPHY N/A 03/01/2020   Procedure: LEFT HEART CATH AND CORONARY ANGIOGRAPHY;  Surgeon: Belva Crome, MD;  Location: [place] CV LAB;  Service: Cardiovascular;  Laterality: N/A;   TEE WITHOUT CARDIOVERSION N/A 03/03/2020   Procedure: TRANSESOPHAGEAL ECHOCARDIOGRAM (TEE);  Surgeon: Lajuana Matte, MD;  Location: [place];  Service: Open Heart Surgery;  Laterality: N/A;    Family History  Problem Relation Age of Onset   Dementia Mother    AAA (abdominal aortic aneurysm) Father    Breast cancer Maternal Aunt    Bladder Cancer Brother    Colon cancer Neg Hx    Stomach cancer Neg Hx    Esophageal cancer Neg Hx    Pancreatic cancer Neg Hx     Social History:  reports that she has never smoked. She has never used smokeless tobacco. She reports that she does not drink alcohol and does not use drugs.  BMI: Estimated body mass index is 34.19 kg/m as calculated from the following:   Height as of this encounter: 5' (1.524 m).   Weight as of this encounter: 79.4 kg.  Lab Results  Component Value Date   ALBUMIN 3.9 07/12/2021   Diabetes:   Patient has a diagnosis of diabetes,  Lab Results  Component Value Date   HGBA1C 7.5 (H) 06/14/2022   Smoking Status:   reports that she has never smoked. She has never used smokeless tobacco.  Medications Prior to Admission  Medication Sig Dispense Refill   Accu-Chek Softclix Lancets lancets USE TO CHECK BLOOD SUGAR TWICE DAILY 102 each 0   aspirin EC 81 MG tablet Take 1 tablet (81 mg total) by mouth daily. Swallow whole. 90 tablet 3   atorvastatin (LIPITOR) 20 MG tablet Take 1 tablet (20 mg total) by mouth at bedtime. 90 tablet 2   Cyanocobalamin (B-12 COMPLIANCE INJECTION) 1000 MCG/ML KIT Inject 1,000 mcg into the muscle every 30 (thirty) days.     DULoxetine (CYMBALTA) 60 MG capsule Take 60 mg by mouth daily.     ELIQUIS 5 MG TABS tablet TAKE ONE TABLET BY MOUTH TWICE DAILY 180 tablet 1    empagliflozin (JARDIANCE) 25 MG TABS tablet Take 25 mg by mouth at bedtime.     furosemide (LASIX) 20 MG tablet Take 20 mg by mouth daily.     glucose blood (ACCU-CHEK AVIVA) test strip Test twice daily 100 each 2   losartan (COZAAR) 25 MG tablet Take 1 tablet (25 mg total) by mouth daily. 30 tablet 3   metFORMIN (GLUCOPHAGE-XR) 750 MG 24 hr tablet Take 750 mg by mouth daily with breakfast.     metoprolol tartrate (LOPRESSOR) 25 MG tablet TAKE 1/2 TABLET BY MOUTH EVERY MORNING and TAKE 1/2 TABLET BY MOUTH EVERYDAY AT BEDTIME 90 tablet 1   traMADol (ULTRAM) 50 MG tablet Take 50-100 mg by mouth every 8 (eight) hours as needed for moderate pain.     traZODone (DESYREL) 100 MG tablet Take 150 mg by mouth at bedtime.     glimepiride (AMARYL) 1 MG tablet 1 tablet with breakfast or the first main meal of the day Orally Once a day for 30 days (Patient not taking: Reported on 06/15/2022)     NON FORMULARY 1 each by Other route See admin instructions. CPAP machine nightly       Physical Exam: Left shoulder demonstrates painful and globally restricted motion as noted at her recent office visits.  She has globally decreased strength to manual muscle testing.  She is otherwise neurovascular intact in left upper extremity and her examination is otherwise readmitted at her recent office visits.  Plain radiographs confirm severe left shoulder arthritis and changes consistent with chronic rotator cuff tear arthropathy  Vitals  Temp:  [97.8 F (36.6 C)] 97.8 F (36.6 C) (07/20 0818) Pulse Rate:  [67] 67 (07/20 0818) Resp:  [18] 18 (07/20 0818) BP: (143)/(85) 143/85 (07/20 0818) SpO2:  [96 %] 96 % (07/20 0818) Weight:  [79.4 kg] 79.4 kg (07/20 0818)  Assessment/Plan  Impression: Left shoulder rotator cuff tear arthropathy  Plan of Action: Procedure(s): REVERSE SHOULDER ARTHROPLASTY  Joseph Bias M Bernadette Gores 06/21/2022, 9:03 AM Contact # [phone]

## 2022-06-21 NOTE — Anesthesia Postprocedure Evaluation (Signed)
Anesthesia Post Note  Patient: [name]  Procedure(s) Performed: REVERSE SHOULDER ARTHROPLASTY (Left: Shoulder)     Patient location during evaluation: PACU Anesthesia Type: General Level of consciousness: awake and alert Pain management: pain level controlled Vital Signs Assessment: post-procedure vital signs reviewed and stable Respiratory status: spontaneous breathing, nonlabored ventilation, respiratory function stable and patient connected to nasal cannula oxygen Cardiovascular status: blood pressure returned to baseline and stable Postop Assessment: no apparent nausea or vomiting Anesthetic complications: yes   Encounter Notable Events  Notable Event Outcome Phase Comment  Difficult to intubate - expected  Intraprocedure Filed from anesthesia note documentation.    Last Vitals:  Vitals:   06/21/22 1325 06/21/22 1427  BP:  135/75  Pulse: 73 66  Resp: (!) 27 12  Temp:  36.4 C  SpO2: 97% 90%    Last Pain:  Vitals:   06/21/22 1427  TempSrc:   PainSc: 0-No pain                 Effie Berkshire

## 2022-06-21 NOTE — Anesthesia Procedure Notes (Signed)
Procedure Name: Intubation Date/Time: 06/21/2022 10:08 AM  Performed by: Deliah Boston, CRNAPre-anesthesia Checklist: Patient identified, Emergency Drugs available, Suction available and Patient being monitored Patient Re-evaluated:Patient Re-evaluated prior to induction Oxygen Delivery Method: Circle system utilized Preoxygenation: Pre-oxygenation with 100% oxygen Induction Type: IV induction Ventilation: Mask ventilation without difficulty Laryngoscope Size: Glidescope and 4 Grade View: Grade II Tube type: Oral Number of attempts: 4 Airway Equipment and Method: Stylet, Rigid stylet and Video-laryngoscopy Placement Confirmation: ETT inserted through vocal cords under direct vision, positive ETCO2 and breath sounds checked- equal and bilateral Secured at: 23 cm Tube secured with: Tape Dental Injury: Teeth and Oropharynx as per pre-operative assessment and Injury to lip  Difficulty Due To: Difficulty was anticipated, Difficult Airway- due to anterior larynx and Difficult Airway- due to limited oral opening Comments: Easy mask, DVL x 1 by CRNA with MAC 3, able to see epiglottis, but not able to lift epiglottis. DVL x 1 by CRNA with MAC 4, able to see epiglottis, but not able to lift epiglottis. Patient ventilated then DVL x 1 by MDA with MAC 4, able to visualize arytenoids, unsuccessful intubation, DVL x 1 by MDA with glidescope MAC 4, grade 2 view, successful intubation. Small nick to upper lip noted.

## 2022-06-21 NOTE — Evaluation (Signed)
Occupational Therapy Evaluation Patient Details Name: KELILAH HEBARD MRN: [id number] DOB: [birth year] Today's Date: 06/21/2022   History of Present Illness Patient s/p left reverse shoulder arthroplasty   Clinical Impression   Ms. Paytyn Mesta is a 81 year old woman s/p shoulder replacement without functional use of right dominant upper extremity secondary to effects of surgery and interscalene block and shoulder precautions. Therapist provided education and instruction to patient in regards to exercises, precautions, positioning, donning upper extremity clothing and bathing while maintaining shoulder precautions, ice and edema management and donning/doffing sling. Patient and verbalized understanding and demonstrated as needed. Patient provided with handouts. Patient's friend present for education as well.Patient needed assistance to donn shirt, underwear, pants, socks and shoes and provided with instruction on compensatory strategies to perform ADLs. Patient has assistance of friends at discharge. She is unsteady and therapist recommended use of cane. Patient's o2 sats WFL with ambulation and activity after initial desat with standing. Patient to follow up with MD for further therapy needs.        Recommendations for follow up therapy are one component of a multi-disciplinary discharge planning process, led by the attending physician.  Recommendations may be updated based on patient status, additional functional criteria and insurance authorization.   Follow Up Recommendations       Assistance Recommended at Discharge Intermittent Supervision/Assistance  Patient can return home with the following A little help with bathing/dressing/bathroom;Assistance with cooking/housework;A little help with walking and/or transfers;Help with stairs or ramp for entrance    Functional Status Assessment  Patient has had a recent decline in their functional status and demonstrates the ability to make  significant improvements in function in a reasonable and predictable amount of time.  Equipment Recommendations  None recommended by OT    Recommendations for Other Services       Precautions / Restrictions Precautions Precautions: Shoulder Type of Shoulder Precautions: If sitting in controlled environment, ok to come out of sling to give neck a break. Please sleep in it to protect until follow up in office.     OK to use operative arm for feeding, hygiene and ADLs.   Ok to instruct Pendulums and lap slides as exercises. Ok to use operative arm within the following parameters for ADL purposes     New ROM (8/18)   Ok for PROM, AAROM, AROM within pain tolerance and within the following ROM   ER 20   ABD 45   FE 60 Shoulder Interventions: Shoulder sling/immobilizer;Off for dressing/bathing/exercises Precaution Booklet Issued: Yes (comment) Required Braces or Orthoses: Sling Restrictions Weight Bearing Restrictions: Yes LUE Weight Bearing: Non weight bearing      Mobility Bed Mobility                    Transfers Overall transfer level: Needs assistance Equipment used: 1 person hand held assist                      Balance Overall balance assessment: Mild deficits observed, not formally tested                                         ADL either performed or assessed with clinical judgement   ADL  Vision Baseline Vision/History: 1 Wears glasses       Perception     Praxis      Pertinent Vitals/Pain Pain Assessment Pain Assessment: No/denies pain     Hand Dominance Right   Extremity/Trunk Assessment Upper Extremity Assessment Upper Extremity Assessment: LUE deficits/detail LUE Deficits / Details: impaired secondary to block   Lower Extremity Assessment Lower Extremity Assessment: Overall WFL for tasks assessed   Cervical / Trunk Assessment Cervical / Trunk  Assessment: Normal   Communication Communication Communication: No difficulties   Cognition Arousal/Alertness: Awake/alert Behavior During Therapy: WFL for tasks assessed/performed Overall Cognitive Status: Within Functional Limits for tasks assessed                                       General Comments       Exercises     Shoulder Instructions Shoulder Instructions Donning/doffing shirt without moving shoulder: Independent;Caregiver independent with task Method for sponge bathing under operated UE: Independent Donning/doffing sling/immobilizer: Independent;Caregiver independent with task Correct positioning of sling/immobilizer: Independent Pendulum exercises (written home exercise program): Independent (modified) ROM for elbow, wrist and digits of operated UE: Independent Sling wearing schedule (on at all times/off for ADL's): Independent Proper positioning of operated UE when showering: Independent Dressing change: Independent Positioning of UE while sleeping: [place] expects to be discharged to:: Private residence Living Arrangements: Non-relatives/Friends Available Help at Discharge: Available PRN/intermittently;Friend(s) Type of Home: House Home Access: Level entry     Home Layout: One level     Bathroom Shower/Tub: Tub/shower unit         Home Equipment: Hand held shower head          Prior Functioning/Environment Prior Level of Function : Independent/Modified Independent                        OT Problem List: Decreased strength;Decreased range of motion;Impaired UE functional use;Obesity      OT Treatment/Interventions:      OT Goals(Current goals can be found in the care plan section) Acute Rehab OT Goals OT Goal Formulation: All assessment and education complete, DC therapy  OT Frequency:      Co-evaluation              AM-PAC OT "6 Clicks" Daily Activity     Outcome Measure  Help from another person eating meals?: A Little Help from another person taking care of personal grooming?: A Little Help from another person toileting, which includes using toliet, bedpan, or urinal?: A Little Help from another person bathing (including washing, rinsing, drying)?: A Little Help from another person to put on and taking off regular upper body clothing?: A Lot Help from another person to put on and taking off regular lower body clothing?: A Little 6 Click Score: 17   End of Session Nurse Communication:  (OT education comlete)  Activity Tolerance: Patient tolerated treatment well Patient left: in chair;with call bell/phone within reach  OT Visit Diagnosis: Muscle weakness (generalized) (M62.81)                Time: 9735-3299 OT Time Calculation (min): 34 min Charges:  OT General Charges $OT Visit: 1 Visit OT Evaluation $OT Eval Low Complexity: 1 Low OT Treatments $Self Care/Home Management : 8-22 mins  Kamoni Depree, OTR/L [place]  Office [phone] Pager: [phone]   Derl Barrow  L Kile Kabler 06/21/2022, 5:20 PM

## 2022-06-21 NOTE — Transfer of Care (Signed)
Immediate Anesthesia Transfer of Care Note  Patient: Judy [name]  Procedure(s) Performed: Procedure(s) with comments: REVERSE SHOULDER ARTHROPLASTY (Left) - 145mn  Patient Location: PACU  Anesthesia Type:General and Regional  Level of Consciousness: Patient easily awoken, sedated, comfortable, cooperative, following commands, responds to stimulation.   Airway & Oxygen Therapy: Patient spontaneously breathing, ventilating well, oxygen via simple oxygen mask.  Post-op Assessment: Report given to PACU RN, vital signs reviewed and stable, moving all extremities.   Post vital signs: Reviewed and stable.  Complications: No apparent anesthesia complications Last Vitals:  Vitals Value Taken Time  BP 178/74 06/21/22 1221  Temp    Pulse 66 06/21/22 1222  Resp 27 06/21/22 1222  SpO2 100 % 06/21/22 1222  Vitals shown include unvalidated device data.  Last Pain:  Vitals:   06/21/22 0818  TempSrc: Oral         Complications:  Encounter Notable Events  Notable Event Outcome Phase Comment  Difficult to intubate - expected  Intraprocedure Filed from anesthesia note documentation.

## 2022-06-21 NOTE — Anesthesia Procedure Notes (Signed)
Anesthesia Regional Block: Interscalene brachial plexus block   Pre-Anesthetic Checklist: , timeout performed,  Correct Patient, Correct Site, Correct Laterality,  Correct Procedure, Correct Position, site marked,  Risks and benefits discussed,  Surgical consent,  Pre-op evaluation,  At surgeon's request and post-op pain management  Laterality: Left  Prep: chloraprep       Needles:  Injection technique: Single-shot  Needle Type: Echogenic Stimulator Needle     Needle Length: 9cm  Needle Gauge: 21     Additional Needles:   Procedures:,,,, ultrasound used (permanent image in chart),,    Narrative:  Start time: 06/21/2022 9:11 AM End time: 06/21/2022 9:16 AM Injection made incrementally with aspirations every 5 mL.  Performed by: Personally  Anesthesiologist: Effie Berkshire, MD  Additional Notes: Patient tolerated the procedure well. Local anesthetic introduced in an incremental fashion under minimal resistance after negative aspirations. No paresthesias were elicited. After completion of the procedure, no acute issues were identified and patient continued to be monitored by RN.

## 2022-06-21 NOTE — Discharge Instructions (Addendum)
Judy [name]. Supple, M.D., F.A.A.O.S. Orthopaedic Surgery Specializing in Arthroscopic and Reconstructive Surgery of the Shoulder [phone] [address] [place], [place] [address] - Fax [phone]   POST-OP TOTAL SHOULDER REPLACEMENT INSTRUCTIONS  Ok to resume your eloquis tomorrow  1. Follow up in the office for your first post-op appointment 10-14 days from the date of your surgery. If you do not already have a scheduled appointment, our office will contact you to schedule.  2. The bandage over your incision is waterproof. You may begin showering with this dressing on. You may leave this dressing on until first follow up appointment within 2 weeks. We prefer you leave this dressing in place until follow up however after 5-7 days if you are having itching or skin irritation and would like to remove it you may do so. Go slow and tug at the borders gently to break the bond the dressing has with the skin. At this point if there is no drainage it is okay to go without a bandage or you may cover it with a light guaze and tape. You can also expect significant bruising around your shoulder that will drift down your arm and into your chest wall. This is very normal and should resolve over several days.   3. Wear your sling/immobilizer at all times except to perform the exercises below or to occasionally let your arm dangle by your side to stretch your elbow. You also need to sleep in your sling immobilizer until instructed otherwise. It is ok to remove your sling if you are sitting in a controlled environment and allow your arm to rest in a position of comfort by your side or on your lap with pillows to give your neck and skin a break from the sling. You may remove it to allow arm to dangle by side to shower. If you are up walking around and when you go to sleep at night you need to wear it.  4. Range of motion to your elbow, wrist, and hand are encouraged 3-5 times daily. Exercise to  your hand and fingers helps to reduce swelling you may experience.   5. Prescriptions for a pain medication and a muscle relaxant are provided for you. It is recommended that if you are experiencing pain that you pain medication alone is not controlling, add the muscle relaxant along with the pain medication which can give additional pain relief. The first 1-2 days is generally the most severe of your pain and then should gradually decrease. As your pain lessens it is recommended that you decrease your use of the pain medications to an "as needed basis'" only and to always comply with the recommended dosages of the pain medications.  6. Pain medications can produce constipation along with their use. If you experience this, the use of an over the counter stool softener or laxative daily is recommended.   7. For additional questions or concerns, please do not hesitate to call the office. If after hours there is an answering service to forward your concerns to the physician on call.  8.Pain control following an exparel block  To help control your post-operative pain you received a nerve block  performed with Exparel which is a long acting anesthetic (numbing agent) which can provide pain relief and sensations of numbness (and relief of pain) in the operative shoulder and arm for up to 3 days. Sometimes it provides mixed relief, meaning you may still have numbness in certain areas of the  arm but can still be able to move  parts of that arm, hand, and fingers. We recommend that your prescribed pain medications  be used as needed. We do not feel it is necessary to "pre medicate" and "stay ahead" of pain.  Taking narcotic pain medications when you are not having any pain can lead to unnecessary and potentially dangerous side effects.    9. Use the ice machine as much as possible in the first 5-7 days from surgery, then you can wean its use to as needed. The ice typically needs to be replaced every 6 hours,  instead of ice you can actually freeze water bottles to put in the cooler and then fill water around them to avoid having to purchase ice. You can have spare water bottles freezing to allow you to rotate them once they have melted. Try to have a thin shirt or light cloth or towel under the ice wrap to protect your skin.   FOR ADDITIONAL INFO ON ICE MACHINE AND INSTRUCTIONS GO TO THE WEBSITE AT  [email or web address]  10.  We recommend that you avoid any dental work or cleaning in the first 3 months following your joint replacement. This is to help minimize the possibility of infection from the bacteria in your mouth that enters your bloodstream during dental work. We also recommend that you take an antibiotic prior to your dental work for the first year after your shoulder replacement to further help reduce that risk. Please simply contact our office for antibiotics to be sent to your pharmacy prior to dental work.  11. Dental Antibiotics:  We recommend waiting at least 3 months for any dental work even cleanings unless there is a IT consultant. We also recommend  prophylactic antibiotics for all dental procdeures  the first year following your joint replacement. In some exceptions we recommend them to be used lifelong. We will provide you with that prescription in follow up office visits, or you can call our office.  Exceptions are as follows:  1. History of prior total joint infection  2. Severely immunocompromised (Organ Transplant, cancer chemotherapy, Rheumatoid biologic meds such as [place])  3. Poorly controlled diabetes (A1C &gt; 8.0, blood glucose over 200)   POST-OP EXERCISES  Pendulum Exercises  Perform pendulum exercises while standing and bending at the waist. Support your uninvolved arm on a table or chair and allow your operated arm to hang freely. Make sure to do these exercises passively - not using you shoulder muscles. These  exercises can be performed once your nerve block effects have worn off.  Repeat 20 times. Do 3 sessions per day.

## 2022-06-25 ENCOUNTER — Encounter (HOSPITAL_COMMUNITY): Payer: Self-pay | Admitting: Orthopedic Surgery

## 2022-07-09 ENCOUNTER — Other Ambulatory Visit: Payer: Self-pay | Admitting: Cardiovascular Disease

## 2022-07-13 MED ORDER — FUROSEMIDE 20 MG PO TABS: 20.0000 mg | ORAL_TABLET | Freq: Every day | ORAL | 4 refills | Status: DC

## 2022-07-13 NOTE — Addendum Note (Signed)
Addended by: Lubertha Sayres on: 07/13/2022 04:14 PM   Modules accepted: Orders

## 2022-07-20 ENCOUNTER — Other Ambulatory Visit: Payer: Self-pay | Admitting: Cardiovascular Disease

## 2022-07-20 DIAGNOSIS — I480 Paroxysmal atrial fibrillation: Secondary | ICD-10-CM

## 2022-07-23 NOTE — Telephone Encounter (Signed)
Eliquis '5mg'$  refill request received. Patient is 81 years old, weight-79.4kg, Crea-1.40 on 06/14/2022, Diagnosis-Afib, and last seen by Christen Bame on 06/15/2022. Dose is appropriate based on dosing criteria. Will send in refill to requested pharmacy.

## 2022-08-21 ENCOUNTER — Inpatient Hospital Stay (HOSPITAL_BASED_OUTPATIENT_CLINIC_OR_DEPARTMENT_OTHER)
Admission: EM | Admit: 2022-08-21 | Discharge: 2022-08-24 | DRG: 281 | Disposition: A | Discharge status: Home / Self Care | Payer: Medicare Other | Attending: Internal Medicine | Admitting: Internal Medicine

## 2022-08-21 ENCOUNTER — Encounter (HOSPITAL_BASED_OUTPATIENT_CLINIC_OR_DEPARTMENT_OTHER): Payer: Self-pay | Admitting: Emergency Medicine

## 2022-08-21 ENCOUNTER — Other Ambulatory Visit: Payer: Self-pay

## 2022-08-21 ENCOUNTER — Emergency Department (HOSPITAL_BASED_OUTPATIENT_CLINIC_OR_DEPARTMENT_OTHER): Payer: Medicare Other

## 2022-08-21 ENCOUNTER — Emergency Department (HOSPITAL_BASED_OUTPATIENT_CLINIC_OR_DEPARTMENT_OTHER): Payer: Medicare Other | Admitting: Radiology

## 2022-08-21 DIAGNOSIS — I214 Non-ST elevation (NSTEMI) myocardial infarction: Principal | ICD-10-CM | POA: Diagnosis present

## 2022-08-21 DIAGNOSIS — Z8673 Personal history of transient ischemic attack (TIA), and cerebral infarction without residual deficits: Secondary | ICD-10-CM

## 2022-08-21 DIAGNOSIS — E785 Hyperlipidemia, unspecified: Secondary | ICD-10-CM | POA: Diagnosis present

## 2022-08-21 DIAGNOSIS — F32A Depression, unspecified: Secondary | ICD-10-CM | POA: Diagnosis present

## 2022-08-21 DIAGNOSIS — G4733 Obstructive sleep apnea (adult) (pediatric): Secondary | ICD-10-CM | POA: Diagnosis present

## 2022-08-21 DIAGNOSIS — I4892 Unspecified atrial flutter: Secondary | ICD-10-CM | POA: Diagnosis present

## 2022-08-21 DIAGNOSIS — Z6835 Body mass index (BMI) 35.0-35.9, adult: Secondary | ICD-10-CM

## 2022-08-21 DIAGNOSIS — Z7982 Long term (current) use of aspirin: Secondary | ICD-10-CM

## 2022-08-21 DIAGNOSIS — I25700 Atherosclerosis of coronary artery bypass graft(s), unspecified, with unstable angina pectoris: Secondary | ICD-10-CM | POA: Diagnosis present

## 2022-08-21 DIAGNOSIS — I130 Hypertensive heart and chronic kidney disease with heart failure and stage 1 through stage 4 chronic kidney disease, or unspecified chronic kidney disease: Secondary | ICD-10-CM | POA: Diagnosis present

## 2022-08-21 DIAGNOSIS — Z7984 Long term (current) use of oral hypoglycemic drugs: Secondary | ICD-10-CM

## 2022-08-21 DIAGNOSIS — Z8616 Personal history of COVID-19: Secondary | ICD-10-CM

## 2022-08-21 DIAGNOSIS — N1831 Chronic kidney disease, stage 3a: Secondary | ICD-10-CM | POA: Diagnosis present

## 2022-08-21 DIAGNOSIS — I10 Essential (primary) hypertension: Secondary | ICD-10-CM | POA: Diagnosis present

## 2022-08-21 DIAGNOSIS — I480 Paroxysmal atrial fibrillation: Secondary | ICD-10-CM | POA: Diagnosis present

## 2022-08-21 DIAGNOSIS — I5032 Chronic diastolic (congestive) heart failure: Secondary | ICD-10-CM | POA: Diagnosis present

## 2022-08-21 DIAGNOSIS — R059 Cough, unspecified: Secondary | ICD-10-CM | POA: Diagnosis present

## 2022-08-21 DIAGNOSIS — I484 Atypical atrial flutter: Secondary | ICD-10-CM | POA: Diagnosis present

## 2022-08-21 DIAGNOSIS — E669 Obesity, unspecified: Secondary | ICD-10-CM | POA: Diagnosis present

## 2022-08-21 DIAGNOSIS — E1165 Type 2 diabetes mellitus with hyperglycemia: Secondary | ICD-10-CM | POA: Diagnosis present

## 2022-08-21 DIAGNOSIS — N390 Urinary tract infection, site not specified: Secondary | ICD-10-CM

## 2022-08-21 DIAGNOSIS — E1121 Type 2 diabetes mellitus with diabetic nephropathy: Secondary | ICD-10-CM

## 2022-08-21 DIAGNOSIS — Z8052 Family history of malignant neoplasm of bladder: Secondary | ICD-10-CM

## 2022-08-21 DIAGNOSIS — Z794 Long term (current) use of insulin: Secondary | ICD-10-CM

## 2022-08-21 DIAGNOSIS — E1169 Type 2 diabetes mellitus with other specified complication: Secondary | ICD-10-CM | POA: Diagnosis present

## 2022-08-21 DIAGNOSIS — Z20822 Contact with and (suspected) exposure to covid-19: Secondary | ICD-10-CM | POA: Diagnosis present

## 2022-08-21 DIAGNOSIS — I25110 Atherosclerotic heart disease of native coronary artery with unstable angina pectoris: Secondary | ICD-10-CM | POA: Diagnosis present

## 2022-08-21 DIAGNOSIS — Z79899 Other long term (current) drug therapy: Secondary | ICD-10-CM

## 2022-08-21 DIAGNOSIS — Z7901 Long term (current) use of anticoagulants: Secondary | ICD-10-CM

## 2022-08-21 DIAGNOSIS — Z6833 Body mass index (BMI) 33.0-33.9, adult: Secondary | ICD-10-CM

## 2022-08-21 DIAGNOSIS — Z9989 Dependence on other enabling machines and devices: Secondary | ICD-10-CM

## 2022-08-21 DIAGNOSIS — Z803 Family history of malignant neoplasm of breast: Secondary | ICD-10-CM

## 2022-08-21 DIAGNOSIS — I252 Old myocardial infarction: Secondary | ICD-10-CM

## 2022-08-21 DIAGNOSIS — E1122 Type 2 diabetes mellitus with diabetic chronic kidney disease: Secondary | ICD-10-CM | POA: Diagnosis present

## 2022-08-21 DIAGNOSIS — R000 Tachycardia, unspecified: Secondary | ICD-10-CM | POA: Diagnosis present

## 2022-08-21 DIAGNOSIS — R778 Other specified abnormalities of plasma proteins: Secondary | ICD-10-CM

## 2022-08-21 DIAGNOSIS — I2582 Chronic total occlusion of coronary artery: Secondary | ICD-10-CM | POA: Diagnosis present

## 2022-08-21 DIAGNOSIS — M109 Gout, unspecified: Secondary | ICD-10-CM | POA: Diagnosis present

## 2022-08-21 DIAGNOSIS — I4891 Unspecified atrial fibrillation: Secondary | ICD-10-CM

## 2022-08-21 DIAGNOSIS — F419 Anxiety disorder, unspecified: Secondary | ICD-10-CM | POA: Diagnosis present

## 2022-08-21 DIAGNOSIS — Z96612 Presence of left artificial shoulder joint: Secondary | ICD-10-CM | POA: Diagnosis present

## 2022-08-21 DIAGNOSIS — E119 Type 2 diabetes mellitus without complications: Secondary | ICD-10-CM

## 2022-08-21 LAB — BASIC METABOLIC PANEL
Anion gap: 12 (ref 5–15)
BUN: 14 mg/dL (ref 8–23)
CO2: 29 mmol/L (ref 22–32)
Calcium: 10.4 mg/dL — ABNORMAL HIGH (ref 8.9–10.3)
Chloride: 100 mmol/L (ref 98–111)
Creatinine, Ser: 1.18 mg/dL — ABNORMAL HIGH (ref 0.44–1.00)
GFR, Estimated: 47 mL/min — ABNORMAL LOW (ref >60)
Glucose, Bld: 143 mg/dL — ABNORMAL HIGH (ref 70–99)
Potassium: 3.9 mmol/L (ref 3.5–5.1)
Sodium: 141 mmol/L (ref 135–145)

## 2022-08-21 LAB — CBC
HCT: 46.1 % — ABNORMAL HIGH (ref 36.0–46.0)
Hemoglobin: 15.3 g/dL — ABNORMAL HIGH (ref 12.0–15.0)
MCH: 30.2 pg (ref 26.0–34.0)
MCHC: 33.2 g/dL (ref 30.0–36.0)
MCV: 90.9 fL (ref 80.0–100.0)
Platelets: 308 10*3/uL (ref 150–400)
RBC: 5.07 MIL/uL (ref 3.87–5.11)
RDW: 13.1 % (ref 11.5–15.5)
WBC: 12.3 10*3/uL — ABNORMAL HIGH (ref 4.0–10.5)
nRBC: 0 % (ref 0.0–0.2)

## 2022-08-21 LAB — TROPONIN I (HIGH SENSITIVITY)
Troponin I (High Sensitivity): 10 ng/L (ref <18)
Troponin I (High Sensitivity): 94 ng/L — ABNORMAL HIGH (ref <18)

## 2022-08-21 LAB — URINALYSIS, ROUTINE W REFLEX MICROSCOPIC
Bilirubin Urine: NEGATIVE
Glucose, UA: >1000 mg/dL — AB
Hgb urine dipstick: NEGATIVE
Ketones, ur: NEGATIVE mg/dL
Nitrite: NEGATIVE
Specific Gravity, Urine: 1.007 (ref 1.005–1.030)
pH: 6 (ref 5.0–8.0)

## 2022-08-21 LAB — BRAIN NATRIURETIC PEPTIDE: B Natriuretic Peptide: 229.9 pg/mL — ABNORMAL HIGH (ref 0.0–100.0)

## 2022-08-21 LAB — SARS CORONAVIRUS 2 BY RT PCR: SARS Coronavirus 2 by RT PCR: NEGATIVE

## 2022-08-21 MED ORDER — ASPIRIN 81 MG PO CHEW
324.0000 mg | CHEWABLE_TABLET | Freq: Once | ORAL | Status: AC
  Administered 2022-08-21: 324 mg via ORAL
  Filled 2022-08-21: qty 4

## 2022-08-21 MED ORDER — DILTIAZEM LOAD VIA INFUSION
15.0000 mg | Freq: Once | INTRAVENOUS | Status: AC
  Administered 2022-08-21: 15 mg via INTRAVENOUS
  Filled 2022-08-21: qty 15

## 2022-08-21 MED ORDER — DILTIAZEM HCL-DEXTROSE 125-5 MG/125ML-% IV SOLN (PREMIX)
5.0000 mg/h | INTRAVENOUS | Status: DC
  Administered 2022-08-21: 5 mg/h via INTRAVENOUS
  Filled 2022-08-21: qty 125

## 2022-08-21 MED ORDER — SODIUM CHLORIDE 0.9 % IV SOLN
1.0000 g | Freq: Once | INTRAVENOUS | Status: AC
  Administered 2022-08-21: 1 g via INTRAVENOUS
  Filled 2022-08-21: qty 10

## 2022-08-21 MED ORDER — DILTIAZEM LOAD VIA INFUSION
10.0000 mg | Freq: Once | INTRAVENOUS | Status: AC
  Administered 2022-08-21: 10 mg via INTRAVENOUS
  Filled 2022-08-21: qty 10

## 2022-08-21 MED ORDER — SODIUM CHLORIDE 0.9 % IV SOLN
500.0000 mg | Freq: Once | INTRAVENOUS | Status: AC
  Administered 2022-08-21: 500 mg via INTRAVENOUS
  Filled 2022-08-21: qty 5

## 2022-08-21 MED ORDER — HEPARIN (PORCINE) 25000 UT/250ML-% IV SOLN
1200.0000 [IU]/h | INTRAVENOUS | Status: DC
  Administered 2022-08-21: 950 [IU]/h via INTRAVENOUS
  Administered 2022-08-22: 1200 [IU]/h via INTRAVENOUS
  Filled 2022-08-21 (×2): qty 250

## 2022-08-21 NOTE — ED Notes (Signed)
Per MD titrate patient to '15mg'$ /hr and bolus '15mg'$  at this time.

## 2022-08-21 NOTE — ED Triage Notes (Signed)
Pt from home stated that about an hour ago  she started experiencing chest pain radiating into the left shoulder and into left jaw. Pt Pt also states that she is SOB and nauseas. Pt has cardiac and stroke hstory

## 2022-08-21 NOTE — Progress Notes (Signed)
ANTICOAGULATION CONSULT NOTE - Initial Consult  Pharmacy Consult for heparin Indication: atrial fibrillation  No Known Allergies  Patient Measurements: Height: 5' (152.4 cm) Weight: 79.4 kg (175 lb) IBW/kg (Calculated) : 45.5 Heparin Dosing Weight: 63.6kg  Vital Signs: Temp: 97.6 F (36.4 C) (09/19 1837) Temp Source: Oral (09/19 1837) BP: 86/78 (09/19 2200) Pulse Rate: 109 (09/19 2200)  Labs: Recent Labs    08/21/22 1845 08/21/22 2052  HGB 15.3*  --   HCT 46.1*  --   PLT 308  --   CREATININE 1.18*  --   TROPONINIHS 10 94*    Estimated Creatinine Clearance: 35.5 mL/min (A) (by C-G formula based on SCr of 1.18 mg/dL (H)).   Medical History: Past Medical History:  Diagnosis Date   Anxiety    Arthritis    Back pain    Coronary artery disease    Depression    Diabetes ([place])    Dyspnea    Gout    Heart attack [facility])    March 2021   History of kidney stones    Hyperlipidemia    Hypertension    Leg edema    Pancreatitis, chronic ([place]) 03/2016   Pneumonia due to COVID-19 virus 12/03/2019   Sleep apnea    C-PAP   Stroke [facility])    March 2021 Rt side weakness   Assessment: 19 yoF with PMH CAD (CABG x5), HLD, HTN, chronic diastolic CHF, paroxysmal afib (PTA Eliquis) who presented with chest pain radiating into left shoulder and into left jaw. Patient also had complaints of shortness of breath and nausea. Pharmacy consulted to dose heparin for afib. Hgb 15.3, plts WNL, troponins 94. Last dose of eliquis was at 1200 on 9/19.  Goal of Therapy:  Heparin level 0.3-0.7 units/ml aPTT 66-102  seconds Monitor platelets by anticoagulation protocol: Yes   Plan:  Start heparin infusion at 950 units/hr Check anti-Xa and aPTT levels in 8 hours and daily while on heparin since patient on DOAC. Continue to monitor until levels correlates Continue to monitor H&H and platelets  Sandford Craze 08/21/2022,10:07 PM

## 2022-08-21 NOTE — ED Provider Notes (Signed)
[place] EMERGENCY DEPT Provider Note   CSN: [id number] Arrival date & time: 08/21/22  1832     History {Add pertinent medical, surgical, social history, OB history to HPI:1} Chief Complaint  Patient presents with   Chest Pain    Judy [name] is a 81 y.o. female.  HPI      81 year old female with a history of coronary artery disease status post CABG, hypertension, hyperlipidemia, paroxysmal atrial fibrillation on chronic anticoagulation with Eliquis, heart failure with preserved ejection fraction, type 2 diabetes, CVA post cath complication, OSA, carotid artery stenosis, hyperlipidemia, presents with concern for chest pain.  Past Medical History:  Diagnosis Date   Anxiety    Arthritis    Back pain    Coronary artery disease    Depression    Diabetes ([place])    Dyspnea    Gout    Heart attack [facility])    March 2021   History of kidney stones    Hyperlipidemia    Hypertension    Leg edema    Pancreatitis, chronic ([place]) 03/2016   Pneumonia due to COVID-19 virus 12/03/2019   Sleep apnea    C-PAP   Stroke [facility])    March 2021 Rt side weakness     Home Medications Prior to Admission medications   Medication Sig Start Date End Date Taking? Authorizing Provider  Accu-Chek Softclix Lancets lancets USE TO CHECK BLOOD SUGAR TWICE DAILY 05/11/21   Elsie Stain, MD  apixaban (ELIQUIS) 5 MG TABS tablet TAKE ONE TABLET BY MOUTH TWICE DAILY 07/23/22   O'Neal, Cassie Freer, MD  aspirin EC 81 MG tablet Take 1 tablet (81 mg total) by mouth daily. Swallow whole. 02/23/21   O'NealCassie Freer, MD  atorvastatin (LIPITOR) 20 MG tablet Take 1 tablet (20 mg total) by mouth at bedtime. 05/04/20   O'Neal, Cassie Freer, MD  Cyanocobalamin (B-12 COMPLIANCE INJECTION) 1000 MCG/ML KIT Inject 1,000 mcg into the muscle every 30 (thirty) days.    [provider]  DULoxetine (CYMBALTA) 60 MG capsule Take 60 mg by mouth daily.    [provider]  empagliflozin  (JARDIANCE) 25 MG TABS tablet Take 25 mg by mouth at bedtime.    [provider]  furosemide (LASIX) 20 MG tablet Take 1 tablet (20 mg total) by mouth daily. 07/13/22   O'Neal, Cassie Freer, MD  glimepiride (AMARYL) 1 MG tablet 1 tablet with breakfast or the first main meal of the day Orally Once a day for 30 days Patient not taking: Reported on 06/15/2022 06/12/22   [provider]  glucose blood (ACCU-CHEK AVIVA) test strip Test twice daily 04/04/20   Elsie Stain, MD  losartan (COZAAR) 25 MG tablet Take 1 tablet (25 mg total) by mouth daily. 04/04/20   Elsie Stain, MD  metFORMIN (GLUCOPHAGE-XR) 750 MG 24 hr tablet Take 750 mg by mouth daily with breakfast. 10/11/20   [provider]  metoprolol tartrate (LOPRESSOR) 25 MG tablet TAKE 1/2 TABLET BY MOUTH EVERY MORNING and TAKE 1/2 TABLET BY MOUTH EVERYDAY AT BEDTIME 03/12/22   O'Neal, Cassie Freer, MD  NON FORMULARY 1 each by Other route See admin instructions. CPAP machine nightly    [provider]  ondansetron (ZOFRAN) 4 MG tablet Take 1 tablet (4 mg total) by mouth every 8 (eight) hours as needed for nausea or vomiting. 06/21/22   Shuford, Olivia Mackie, PA-C  oxyCODONE-acetaminophen (PERCOCET) 5-325 MG tablet Take 1 tablet by mouth every 4 (four) hours as  needed (max 6 q). 06/21/22   Shuford, Olivia Mackie, PA-C  tiZANidine (ZANAFLEX) 4 MG tablet Take 1 tablet (4 mg total) by mouth every 8 (eight) hours as needed for muscle spasms. 06/21/22 06/21/23  Shuford, Olivia Mackie, PA-C  traMADol (ULTRAM) 50 MG tablet Take 50-100 mg by mouth every 8 (eight) hours as needed for moderate pain. 02/14/22   [provider]  traZODone (DESYREL) 100 MG tablet Take 150 mg by mouth at bedtime. 04/28/20   [provider]      Allergies    Patient has no known allergies.    Review of Systems   Review of Systems  Physical Exam Updated Vital Signs BP (!) 138/99 (BP Location: Right Arm)   Pulse (!) 144   Temp 97.6 F (36.4 C)  (Oral)   Resp 20   Ht 5' (1.524 m)   Wt 79.4 kg   SpO2 96%   BMI 34.18 kg/m  Physical Exam  ED Results / Procedures / Treatments   Labs (all labs ordered are listed, but only abnormal results are displayed) Labs Reviewed  CBC - Abnormal; Notable for the following components:      Result Value   WBC 12.3 (*)    Hemoglobin 15.3 (*)    HCT 46.1 (*)    All other components within normal limits  BASIC METABOLIC PANEL  TROPONIN I (HIGH SENSITIVITY)    EKG None  Radiology No results found.  Procedures Procedures  {Document cardiac monitor, telemetry assessment procedure when appropriate:1}  Medications Ordered in ED Medications - No data to display  ED Course/ Medical Decision Making/ A&P                           Medical Decision Making Amount and/or Complexity of Data Reviewed Labs: ordered. Radiology: ordered.   ***  {Document critical care time when appropriate:1} {Document review of labs and clinical decision tools ie heart score, Chads2Vasc2 etc:1}  {Document your independent review of radiology images, and any outside records:1} {Document your discussion with family members, caretakers, and with consultants:1} {Document social determinants of health affecting pt's care:1} {Document your decision making why or why not admission, treatments were needed:1} Final Clinical Impression(s) / ED Diagnoses Final diagnoses:  None    Rx / DC Orders ED Discharge Orders     None

## 2022-08-22 ENCOUNTER — Inpatient Hospital Stay (HOSPITAL_BASED_OUTPATIENT_CLINIC_OR_DEPARTMENT_OTHER): Payer: Medicare Other

## 2022-08-22 DIAGNOSIS — Z794 Long term (current) use of insulin: Secondary | ICD-10-CM

## 2022-08-22 DIAGNOSIS — E669 Obesity, unspecified: Secondary | ICD-10-CM | POA: Diagnosis not present

## 2022-08-22 DIAGNOSIS — Z8052 Family history of malignant neoplasm of bladder: Secondary | ICD-10-CM | POA: Diagnosis not present

## 2022-08-22 DIAGNOSIS — Z9989 Dependence on other enabling machines and devices: Secondary | ICD-10-CM

## 2022-08-22 DIAGNOSIS — I25700 Atherosclerosis of coronary artery bypass graft(s), unspecified, with unstable angina pectoris: Secondary | ICD-10-CM | POA: Diagnosis not present

## 2022-08-22 DIAGNOSIS — Z6833 Body mass index (BMI) 33.0-33.9, adult: Secondary | ICD-10-CM | POA: Diagnosis not present

## 2022-08-22 DIAGNOSIS — I10 Essential (primary) hypertension: Secondary | ICD-10-CM | POA: Diagnosis not present

## 2022-08-22 DIAGNOSIS — I130 Hypertensive heart and chronic kidney disease with heart failure and stage 1 through stage 4 chronic kidney disease, or unspecified chronic kidney disease: Secondary | ICD-10-CM | POA: Diagnosis not present

## 2022-08-22 DIAGNOSIS — I214 Non-ST elevation (NSTEMI) myocardial infarction: Secondary | ICD-10-CM

## 2022-08-22 DIAGNOSIS — G4733 Obstructive sleep apnea (adult) (pediatric): Secondary | ICD-10-CM

## 2022-08-22 DIAGNOSIS — Z8673 Personal history of transient ischemic attack (TIA), and cerebral infarction without residual deficits: Secondary | ICD-10-CM | POA: Diagnosis not present

## 2022-08-22 DIAGNOSIS — I480 Paroxysmal atrial fibrillation: Secondary | ICD-10-CM | POA: Diagnosis not present

## 2022-08-22 DIAGNOSIS — E1169 Type 2 diabetes mellitus with other specified complication: Secondary | ICD-10-CM | POA: Diagnosis not present

## 2022-08-22 DIAGNOSIS — I4892 Unspecified atrial flutter: Secondary | ICD-10-CM

## 2022-08-22 DIAGNOSIS — Z6835 Body mass index (BMI) 35.0-35.9, adult: Secondary | ICD-10-CM

## 2022-08-22 DIAGNOSIS — I5032 Chronic diastolic (congestive) heart failure: Secondary | ICD-10-CM | POA: Diagnosis not present

## 2022-08-22 DIAGNOSIS — E785 Hyperlipidemia, unspecified: Secondary | ICD-10-CM

## 2022-08-22 DIAGNOSIS — E1165 Type 2 diabetes mellitus with hyperglycemia: Secondary | ICD-10-CM | POA: Diagnosis not present

## 2022-08-22 DIAGNOSIS — R778 Other specified abnormalities of plasma proteins: Secondary | ICD-10-CM | POA: Diagnosis not present

## 2022-08-22 DIAGNOSIS — F32A Depression, unspecified: Secondary | ICD-10-CM | POA: Diagnosis not present

## 2022-08-22 DIAGNOSIS — Z20822 Contact with and (suspected) exposure to covid-19: Secondary | ICD-10-CM | POA: Diagnosis not present

## 2022-08-22 DIAGNOSIS — I484 Atypical atrial flutter: Secondary | ICD-10-CM | POA: Diagnosis present

## 2022-08-22 DIAGNOSIS — Z8616 Personal history of COVID-19: Secondary | ICD-10-CM | POA: Diagnosis not present

## 2022-08-22 DIAGNOSIS — I2582 Chronic total occlusion of coronary artery: Secondary | ICD-10-CM | POA: Diagnosis not present

## 2022-08-22 DIAGNOSIS — E1159 Type 2 diabetes mellitus with other circulatory complications: Secondary | ICD-10-CM

## 2022-08-22 DIAGNOSIS — Z803 Family history of malignant neoplasm of breast: Secondary | ICD-10-CM | POA: Diagnosis not present

## 2022-08-22 DIAGNOSIS — R7989 Other specified abnormal findings of blood chemistry: Secondary | ICD-10-CM

## 2022-08-22 DIAGNOSIS — E1122 Type 2 diabetes mellitus with diabetic chronic kidney disease: Secondary | ICD-10-CM | POA: Diagnosis not present

## 2022-08-22 DIAGNOSIS — I252 Old myocardial infarction: Secondary | ICD-10-CM | POA: Diagnosis not present

## 2022-08-22 DIAGNOSIS — I25110 Atherosclerotic heart disease of native coronary artery with unstable angina pectoris: Secondary | ICD-10-CM | POA: Diagnosis not present

## 2022-08-22 DIAGNOSIS — Z79899 Other long term (current) drug therapy: Secondary | ICD-10-CM | POA: Diagnosis not present

## 2022-08-22 DIAGNOSIS — N1831 Chronic kidney disease, stage 3a: Secondary | ICD-10-CM | POA: Diagnosis not present

## 2022-08-22 LAB — LIPID PANEL
Cholesterol: 111 mg/dL (ref 0–200)
HDL: 44 mg/dL (ref >40)
LDL Cholesterol: 46 mg/dL (ref 0–99)
Total CHOL/HDL Ratio: 2.5 RATIO
Triglycerides: 106 mg/dL (ref <150)
VLDL: 21 mg/dL (ref 0–40)

## 2022-08-22 LAB — APTT
aPTT: 50 seconds — ABNORMAL HIGH (ref 24–36)
aPTT: 67 seconds — ABNORMAL HIGH (ref 24–36)

## 2022-08-22 LAB — CBC
HCT: 39.2 % (ref 36.0–46.0)
Hemoglobin: 13.3 g/dL (ref 12.0–15.0)
MCH: 30.4 pg (ref 26.0–34.0)
MCHC: 33.9 g/dL (ref 30.0–36.0)
MCV: 89.5 fL (ref 80.0–100.0)
Platelets: 238 10*3/uL (ref 150–400)
RBC: 4.38 MIL/uL (ref 3.87–5.11)
RDW: 13.1 % (ref 11.5–15.5)
WBC: 9.3 10*3/uL (ref 4.0–10.5)
nRBC: 0 % (ref 0.0–0.2)

## 2022-08-22 LAB — ECHOCARDIOGRAM LIMITED
AR max vel: 2.02 cm2
AV Area VTI: 2.16 cm2
AV Area mean vel: 2.05 cm2
AV Mean grad: 2 mmHg
AV Peak grad: 3.5 mmHg
Ao pk vel: 0.94 m/s
Area-P 1/2: 4.6 cm2
Calc EF: 43.4 %
Height: 60 in
MV M vel: 4.32 m/s
MV Peak grad: 74.5 mmHg
S' Lateral: 3.6 cm
Single Plane A2C EF: 44.2 %
Single Plane A4C EF: 41.6 %
Weight: 2830.4 oz

## 2022-08-22 LAB — HEMOGLOBIN A1C
Hgb A1c MFr Bld: 6.7 % — ABNORMAL HIGH (ref 4.8–5.6)
Mean Plasma Glucose: 145.59 mg/dL

## 2022-08-22 LAB — GLUCOSE, CAPILLARY
Glucose-Capillary: 112 mg/dL — ABNORMAL HIGH (ref 70–99)
Glucose-Capillary: 127 mg/dL — ABNORMAL HIGH (ref 70–99)
Glucose-Capillary: 168 mg/dL — ABNORMAL HIGH (ref 70–99)
Glucose-Capillary: 228 mg/dL — ABNORMAL HIGH (ref 70–99)

## 2022-08-22 LAB — TROPONIN I (HIGH SENSITIVITY)
Troponin I (High Sensitivity): 1665 ng/L (ref <18)
Troponin I (High Sensitivity): 962 ng/L (ref <18)

## 2022-08-22 LAB — HEPARIN LEVEL (UNFRACTIONATED): Heparin Unfractionated: >1.1 IU/mL — ABNORMAL HIGH (ref 0.30–0.70)

## 2022-08-22 MED ORDER — TRAZODONE HCL 50 MG PO TABS
150.0000 mg | ORAL_TABLET | Freq: Every day | ORAL | Status: DC
  Administered 2022-08-22 – 2022-08-23 (×2): 150 mg via ORAL
  Filled 2022-08-22 (×2): qty 1

## 2022-08-22 MED ORDER — INSULIN ASPART 100 UNIT/ML IJ SOLN
0.0000 [IU] | Freq: Three times a day (TID) | INTRAMUSCULAR | Status: DC
  Administered 2022-08-22: 2 [IU] via SUBCUTANEOUS
  Administered 2022-08-22: 3 [IU] via SUBCUTANEOUS
  Administered 2022-08-23 (×3): 2 [IU] via SUBCUTANEOUS
  Administered 2022-08-24: 8 [IU] via SUBCUTANEOUS
  Administered 2022-08-24: 2 [IU] via SUBCUTANEOUS

## 2022-08-22 MED ORDER — LOSARTAN POTASSIUM 25 MG PO TABS
25.0000 mg | ORAL_TABLET | Freq: Every day | ORAL | Status: DC
  Administered 2022-08-22 – 2022-08-24 (×3): 25 mg via ORAL
  Filled 2022-08-22 (×3): qty 1

## 2022-08-22 MED ORDER — ASPIRIN 81 MG PO TBEC
81.0000 mg | DELAYED_RELEASE_TABLET | Freq: Every day | ORAL | Status: DC
  Administered 2022-08-22 – 2022-08-24 (×3): 81 mg via ORAL
  Filled 2022-08-22 (×3): qty 1

## 2022-08-22 MED ORDER — INSULIN ASPART 100 UNIT/ML IJ SOLN
0.0000 [IU] | Freq: Every day | INTRAMUSCULAR | Status: DC
  Administered 2022-08-22: 2 [IU] via SUBCUTANEOUS

## 2022-08-22 MED ORDER — ATORVASTATIN CALCIUM 10 MG PO TABS
20.0000 mg | ORAL_TABLET | Freq: Every day | ORAL | Status: DC
  Administered 2022-08-22 – 2022-08-23 (×2): 20 mg via ORAL
  Filled 2022-08-22 (×2): qty 2

## 2022-08-22 MED ORDER — METOPROLOL TARTRATE 12.5 MG HALF TABLET
12.5000 mg | ORAL_TABLET | Freq: Two times a day (BID) | ORAL | Status: DC
  Administered 2022-08-22: 12.5 mg via ORAL
  Filled 2022-08-22: qty 1

## 2022-08-22 MED ORDER — ACETAMINOPHEN 325 MG PO TABS
650.0000 mg | ORAL_TABLET | Freq: Four times a day (QID) | ORAL | Status: DC | PRN
  Administered 2022-08-23: 650 mg via ORAL
  Filled 2022-08-22: qty 2

## 2022-08-22 MED ORDER — ACETAMINOPHEN 650 MG RE SUPP
650.0000 mg | Freq: Four times a day (QID) | RECTAL | Status: DC | PRN
  Filled 2022-08-22: qty 1

## 2022-08-22 MED ORDER — FUROSEMIDE 20 MG PO TABS
20.0000 mg | ORAL_TABLET | Freq: Every day | ORAL | Status: DC
  Administered 2022-08-22: 20 mg via ORAL
  Filled 2022-08-22: qty 1

## 2022-08-22 MED ORDER — METOPROLOL TARTRATE 25 MG PO TABS
25.0000 mg | ORAL_TABLET | Freq: Two times a day (BID) | ORAL | Status: DC
  Administered 2022-08-22 – 2022-08-24 (×3): 25 mg via ORAL
  Filled 2022-08-22 (×3): qty 1

## 2022-08-22 MED ORDER — HEPARIN BOLUS VIA INFUSION
100.0000 [IU] | Freq: Once | INTRAVENOUS | Status: DC
  Filled 2022-08-22: qty 100

## 2022-08-22 MED ORDER — NYSTATIN 100000 UNIT/GM EX POWD
Freq: Once | CUTANEOUS | Status: AC
  Filled 2022-08-22: qty 15

## 2022-08-22 MED ORDER — PERFLUTREN LIPID MICROSPHERE
1.0000 mL | INTRAVENOUS | Status: AC | PRN
  Administered 2022-08-22: 2 mL via INTRAVENOUS

## 2022-08-22 MED ORDER — POTASSIUM CHLORIDE CRYS ER 20 MEQ PO TBCR
40.0000 meq | EXTENDED_RELEASE_TABLET | Freq: Once | ORAL | Status: AC
  Administered 2022-08-22: 40 meq via ORAL
  Filled 2022-08-22: qty 2

## 2022-08-22 MED ORDER — ONDANSETRON HCL 4 MG/2ML IJ SOLN: 4.0000 mg | Freq: Four times a day (QID) | INTRAMUSCULAR | Status: DC | PRN

## 2022-08-22 MED ORDER — ONDANSETRON HCL 4 MG PO TABS: 4.0000 mg | ORAL_TABLET | Freq: Four times a day (QID) | ORAL | Status: DC | PRN

## 2022-08-22 MED ORDER — EMPAGLIFLOZIN 25 MG PO TABS
25.0000 mg | ORAL_TABLET | Freq: Every day | ORAL | Status: DC
  Administered 2022-08-22 – 2022-08-23 (×2): 25 mg via ORAL
  Filled 2022-08-22 (×3): qty 1

## 2022-08-22 MED ORDER — HEPARIN BOLUS VIA INFUSION
1000.0000 [IU] | Freq: Once | INTRAVENOUS | Status: AC
  Administered 2022-08-22: 1000 [IU] via INTRAVENOUS
  Filled 2022-08-22: qty 1000

## 2022-08-22 NOTE — Progress Notes (Signed)
Patient admitted early this morning for chest pain and was found to have atrial flutter with rapid ventricular response, initially started on Cardizem drip but subsequently discontinued as patient converted to normal sinus rhythm.  Patient seen and examined at bedside and plan of care discussed with her.  I have reviewed patient's medical records including this morning's H&P, current vitals, labs, medications myself.  Troponin this morning was 1665 but patient denies any chest pain.  I have consulted cardiology.  Follow cardiology recommendations.  Continue heparin drip.  Follow 2D echo.  Currently rate controlled.

## 2022-08-22 NOTE — Assessment & Plan Note (Signed)
Stable.  -Continue CPAP

## 2022-08-22 NOTE — Progress Notes (Signed)
[place] for heparin Indication: atrial fibrillation Brief A/P: aPTT subtherapeutic  Increase Heparin rate  No Known Allergies  Patient Measurements: Height: 5' (152.4 cm) Weight: 80.2 kg (176 lb 14.4 oz) IBW/kg (Calculated) : 45.5 Heparin Dosing Weight: 63.6kg  Vital Signs: Temp: 98 F (36.7 C) (09/20 0251) Temp Source: Oral (09/19 2255) BP: 137/64 (09/20 0251) Pulse Rate: 57 (09/20 0251)  Labs: Recent Labs    08/21/22 1845 08/21/22 2052 08/22/22 0503  HGB 15.3*  --  13.3  HCT 46.1*  --  39.2  PLT 308  --  238  APTT  --   --  50*  HEPARINUNFRC  --   --  >1.10*  CREATININE 1.18*  --   --   TROPONINIHS 10 94*  --      Estimated Creatinine Clearance: 35.7 mL/min (A) (by C-G formula based on SCr of 1.18 mg/dL (H)).  Assessment: 81 y.o. female admitted with chest pain, h/o Afib and Elquis on hold, for heparin  Goal of Therapy:  Heparin level 0.3-0.7 units/ml aPTT 66-102  seconds Monitor platelets by anticoagulation protocol: Yes   Plan:  Increase Heparin 1050 units/hr Check aPTT in 8 hours   Starkeisha Vanwinkle, Bronson Curb 08/22/2022,6:34 AM

## 2022-08-22 NOTE — Progress Notes (Addendum)
ANTICOAGULATION CONSULT NOTE - follow-up Pharmacy Consult for heparin Indication: changed to ACS / STEMI. Patient has a history of AF Brief A/P: aPTT subtherapeutic after a rate change wihtout a bolus 2/2 AF indication that has now changed to ACS. Provide a partial bolus f/b increase Heparin rate  No Known Allergies  Patient Measurements: Height: 5' (152.4 cm) Weight: 80.2 kg (176 lb 14.4 oz) IBW/kg (Calculated) : 45.5 Heparin Dosing Weight: 63.6kg  Vital Signs: Temp: 98 F (36.7 C) (09/20 0251) Temp Source: Oral (09/19 2255) BP: 162/77 (09/20 0843) Pulse Rate: 78 (09/20 0843)  Labs: Recent Labs    08/21/22 1845 08/21/22 2052 08/22/22 0503  HGB 15.3*  --  13.3  HCT 46.1*  --  39.2  PLT 308  --  238  APTT  --   --  50*  HEPARINUNFRC  --   --  >1.10*  CREATININE 1.18*  --   --   TROPONINIHS 10 94* 1,665*     Estimated Creatinine Clearance: 35.7 mL/min (A) (by C-G formula based on SCr of 1.18 mg/dL (H)).  Assessment: 82 y.o. female admitted with chest pain, h/o Afib and Elquis on hold, for heparin. Current diagnosis is ACS  Goal of Therapy:  Heparin level 0.3-0.7 units/ml aPTT 66-102  seconds Monitor platelets by anticoagulation protocol: Yes   Plan:  Heparin bolus 1000 units (~15 units/kg) f/b increase Heparin 1150 units/hr Check aPTT in 6 hours   Sophira Rumler BS, PharmD, BCPS Clinical Pharmacist 08/22/2022 9:04 AM  Contact: [phone] after 3 PM  "Be curious, not judgmental..." -Jamal Maes

## 2022-08-22 NOTE — ED Notes (Signed)
SBAR report given to [place], RN at [facility] at this time.

## 2022-08-22 NOTE — Consult Note (Addendum)
Cardiology Consultation   Patient ID: Judy [name] MRN: [id number]; DOB: 01/07/1941  Admit date: 08/21/2022 Date of Consult: 08/22/2022  PCP:  Michael Boston, MD   [facility] Cardiologist:  Evalina Field, MD   {   Patient Profile:   Judy [name] is a 81 y.o. female with a history of CAD s/p CABG x5 (LIMA-LAD, SVG-OM2, SVG-OM3, SVG-1st Diag, SVG-PLV) in 03/2020, HFpEF, paroxysmal atrial fibrillation on Eliquis, hypertension, hyperlipdiemia, type 2 diabetes mellitus, CVA (post-cath complication), mild bilateral carotid stenosis, obstructive sleep apnea, and gout who is being seen 08/22/2022 for the evaluation of atrial flutter and elevated troponin at the request of Dr. Starla Link.  History of Present Illness:   Judy [name] is a 81 year old female with the above history who is followed by Dr. Audie Box.  Patient was admitted on 02/2020 with a NSTEMI.  Echo showed LVEF of 45-50% with inferior hypokinesis and mild LVH.  LHC at that time showed multivessel CAD.  CT surgery was consulted and she was felt to be a good candidate for bypass surgery. However, she ten developed some confusion following cath and brain MRI revealed a subcentimeter cerebellar infarct. After her mental status improved, she underwent CABG x5 with LIMA to LAD, SVG to OM 2, SVG to OM 3, SVG to 1st Diag, and SVG to PLV on 03/03/2022 with Dr. Kipp Brood. Post-op course was relatively unremarkable with expected anemia and volume overload. She returned to the ED on 03/11/2020 with chest tightness and was found to be in new onset atrial fibrillation with RVR. Aspirin was stopped and she was started on Eliquis in addition to Plavix. Fortunately, she converted back to sinus rhythm while in the ED. Patient was last seen by Christen Bame, NP, in 06/2022 at which time she reported shortness of breath as well as bilateral lower extremity edema. Pro BNP normal. Echo was ordered and showed LVEF of 60-65% with no regional wall  motion abnormalities, normal RV, and mild MR.  Presented to the ED on 08/21/1992 for further evaluation of chest pain.  Upon arrival to the ED, she was tachycardic and mildly hypertensive.  EKG shows atrial flutter, rate 148 bpm, with ST elevations in aVR, and diffuse ST depressions in inferior lateral leads.  Repeat EKGs after improvement in rate showed improvement in ST changes but they did not completely resolve.  Initial high-sensitivity troponin negative at 10 but delta positive at 94 >> 1,665.  BNP mildly elevated at 229. Chest x-ray showed low lung volumes with developing retrocardiac airspace opacity. WBC 12.3, Hgb 15.3, Plts 308. Na 141, K 3.9, Glucose 143, BUN 14, Cr 1.18.  Urinalysis showed > 1,000 glucose, trace protein, and small leukocytes.  Maneuvers were attempted in the ED improvement but flutter waves were reportedly seen.  He was started on IV Cardizem drip with bolus, IV heparin, and antibiotics for possible UTI and pneumonia.  At the time of this evaluation, patient is resting comfortably no acute distress.  She converted back to sinus rhythm shortly after midnight.  Rates currently in the 50s to 59s.  Patient states she was in her usual state of health until yesterday morning she developed sudden onset of substernal chest tightness with radiation to her left neck, shoulder, and arm with associated heart racing and shortness of breath. She also reports feeling very fatigued with this. This occurred while she was cleaning around her house.  Patient states that this does feel similar to her symptoms prior to  her CABG in 2021.  She knew that something was not right and had a friend take her to the ED.  She denies any recent chest pain prior to this.  She does have chronic dyspnea on exertion and states it has been getting worse.  No significant shortness of breath at rest.  No orthopnea or PND.  She has intermittent lower extremity swelling which is relatively well controlled on Lasix.  She  states she felt "off" yesterday during episode of chest tightness and heart racing but does not endorse any real dizziness or lightheadedness.  No syncope.  She denies any recent fevers or illnesses.  She does report a nonproductive cough but no other respiratory symptoms.  No nausea or vomiting.  No abnormal bleeding in urine or stools.  Patient states she had some recurrent chest pain earlier this morning but is currently chest pain free.  Past Medical History:  Diagnosis Date   Anxiety    Arthritis    Back pain    Coronary artery disease    Depression    Diabetes ([place])    Dyspnea    Gout    Heart attack [facility])    March 2021   History of kidney stones    Hyperlipidemia    Hypertension    Leg edema    Pancreatitis, chronic ([place]) 03/2016   Pneumonia due to COVID-19 virus 12/03/2019   Sleep apnea    C-PAP   Stroke [facility])    March 2021 Rt side weakness    Past Surgical History:  Procedure Laterality Date   BACK SURGERY  2004   BREAST LUMPECTOMY WITH RADIOACTIVE SEED LOCALIZATION Right 10/23/2019   Procedure: RIGHT BREAST LUMPECTOMY WITH RADIOACTIVE SEED LOCALIZATION;  Surgeon: Jovita Kussmaul, MD;  Location: [place];  Service: General;  Laterality: Right;   CHOLECYSTECTOMY  2001   cardiac event during surgery   COLONOSCOPY  2013   CORONARY ARTERY BYPASS GRAFT N/A 03/03/2020   Procedure: CORONARY ARTERY BYPASS GRAFTING (CABG), ON PUMP, TIMES FIVE, USING LEFT INTERNAL MAMMARY ARTERY AND ENDOSCOPICALLY HARVESTED BILATERAL GREATER SAPHENOUS VEINS -flow track only;  Surgeon: Lajuana Matte, MD;  Location: [place];  Service: Open Heart Surgery;  Laterality: N/A;   LEFT HEART CATH AND CORONARY ANGIOGRAPHY N/A 03/01/2020   Procedure: LEFT HEART CATH AND CORONARY ANGIOGRAPHY;  Surgeon: Belva Crome, MD;  Location: [place] CV LAB;  Service: Cardiovascular;  Laterality: N/A;   REVERSE SHOULDER ARTHROPLASTY Left 06/21/2022   Procedure: REVERSE SHOULDER ARTHROPLASTY;  Surgeon: Justice Britain, MD;  Location: [facility];  Service: Orthopedics;  Laterality: Left;  148mn   TEE WITHOUT CARDIOVERSION N/A 03/03/2020   Procedure: TRANSESOPHAGEAL ECHOCARDIOGRAM (TEE);  Surgeon: LLajuana Matte MD;  [facility]  Service: Open Heart Surgery;  Laterality: N/A;     Home Medications:  Prior to Admission medications   Medication Sig Start Date End Date Taking? Authorizing Provider  apixaban (ELIQUIS) 5 MG TABS tablet TAKE ONE TABLET BY MOUTH TWICE DAILY 07/23/22  Yes O'Neal, WCassie Freer MD  aspirin EC 81 MG tablet Take 1 tablet (81 mg total) by mouth daily. Swallow whole. 02/23/21  Yes O'Neal, WCassie Freer MD  atorvastatin (LIPITOR) 20 MG tablet Take 1 tablet (20 mg total) by mouth at bedtime. 05/04/20  Yes O'Neal, WCassie Freer MD  Cyanocobalamin (B-12 COMPLIANCE INJECTION) 1000 MCG/ML KIT Inject 1,000 mcg into the muscle every 30 (thirty) days.   Yes [provider]  DULoxetine (CYMBALTA) 60 MG capsule Take  60 mg by mouth daily.   Yes [provider]  empagliflozin (JARDIANCE) 25 MG TABS tablet Take 25 mg by mouth at bedtime.   Yes [provider]  furosemide (LASIX) 20 MG tablet Take 1 tablet (20 mg total) by mouth daily. 07/13/22  Yes O'Neal, Cassie Freer, MD  losartan (COZAAR) 25 MG tablet Take 1 tablet (25 mg total) by mouth daily. 04/04/20  Yes Elsie Stain, MD  metFORMIN (GLUCOPHAGE-XR) 750 MG 24 hr tablet Take 750 mg by mouth daily with breakfast. 10/11/20  Yes [provider]  metoprolol tartrate (LOPRESSOR) 25 MG tablet TAKE 1/2 TABLET BY MOUTH EVERY MORNING and TAKE 1/2 TABLET BY MOUTH EVERYDAY AT BEDTIME 03/12/22  Yes O'Neal, Cassie Freer, MD  ondansetron (ZOFRAN) 4 MG tablet Take 1 tablet (4 mg total) by mouth every 8 (eight) hours as needed for nausea or vomiting. 06/21/22  Yes Shuford, Olivia Mackie, PA-C  tiZANidine (ZANAFLEX) 4 MG tablet Take 1 tablet (4 mg total) by mouth every 8 (eight) hours as needed for muscle spasms. 06/21/22  06/21/23 Yes Shuford, Olivia Mackie, PA-C  traMADol (ULTRAM) 50 MG tablet Take 50-100 mg by mouth every 8 (eight) hours as needed for moderate pain. 02/14/22  Yes [provider]  traZODone (DESYREL) 100 MG tablet Take 150 mg by mouth at bedtime. 04/28/20  Yes [provider]  Accu-Chek Softclix Lancets lancets USE TO CHECK BLOOD SUGAR TWICE DAILY 05/11/21   Elsie Stain, MD  glimepiride (AMARYL) 1 MG tablet 1 tablet with breakfast or the first main meal of the day Orally Once a day for 30 days Patient not taking: Reported on 06/15/2022 06/12/22   [provider]  glucose blood (ACCU-CHEK AVIVA) test strip Test twice daily 04/04/20   Elsie Stain, MD  NON FORMULARY 1 each by Other route See admin instructions. CPAP machine nightly    [provider]  oxyCODONE-acetaminophen (PERCOCET) 5-325 MG tablet Take 1 tablet by mouth every 4 (four) hours as needed (max 6 q). Patient not taking: Reported on 08/22/2022 06/21/22   Jenetta Loges, PA-C    Inpatient Medications: Scheduled Meds:  aspirin EC  81 mg Oral Daily   atorvastatin  20 mg Oral QHS   empagliflozin  25 mg Oral QHS   furosemide  20 mg Oral Daily   insulin aspart  0-15 Units Subcutaneous TID WC   insulin aspart  0-5 Units Subcutaneous QHS   losartan  25 mg Oral Daily   metoprolol tartrate  12.5 mg Oral BID   Continuous Infusions:  heparin 1,150 Units/hr (08/22/22 1007)   PRN Meds: acetaminophen **OR** acetaminophen, ondansetron **OR** ondansetron (ZOFRAN) IV  Allergies:   No Known Allergies  Social History:   Social History   Socioeconomic History   Marital status: Divorced    Spouse name: Not on file   Number of children: 1   Years of education: 12   Highest education level: Some college, no degree  Occupational History   Occupation: CNA  Tobacco Use   Smoking status: Never   Smokeless tobacco: Never  Vaping Use   Vaping Use: Never used  Substance and Sexual Activity   Alcohol use: No     Alcohol/week: 0.0 standard drinks of alcohol   Drug use: No   Sexual activity: Not Currently  Other Topics Concern   Not on file  Social History Narrative   Lives with Roommate   Drinks 32 ounces of caffeine daily   Right Handed   Social Determinants  of Health   Financial Resource Strain: Not on file  Food Insecurity: No Food Insecurity (08/22/2022)   Hunger Vital Sign    Worried About Running Out of Food in the Last Year: Never true    Ran Out of Food in the Last Year: Never true  Transportation Needs: No Transportation Needs (08/22/2022)   PRAPARE - Hydrologist (Medical): No    Lack of Transportation (Non-Medical): No  Physical Activity: Not on file  Stress: Not on file  Social Connections: Not on file  Intimate Partner Violence: Not At Risk (08/22/2022)   Humiliation, Afraid, Rape, and Kick questionnaire    Fear of Current or Ex-Partner: No    Emotionally Abused: No    Physically Abused: No    Sexually Abused: No    Family History:   Family History  Problem Relation Age of Onset   Dementia Mother    AAA (abdominal aortic aneurysm) Father    Breast cancer Maternal Aunt    Bladder Cancer Brother    Colon cancer Neg Hx    Stomach cancer Neg Hx    Esophageal cancer Neg Hx    Pancreatic cancer Neg Hx      ROS:  Please see the history of present illness.  Review of Systems  Constitutional:  Positive for malaise/fatigue. Negative for fever.  HENT:  Negative for congestion.   Respiratory:  Positive for cough and shortness of breath. Negative for sputum production.   Cardiovascular:  Positive for chest pain, palpitations and leg swelling. Negative for orthopnea and PND.  Gastrointestinal:  Negative for blood in stool, melena, nausea and vomiting.  Genitourinary:  Negative for hematuria.  Musculoskeletal:  Negative for myalgias.  Neurological:  Negative for dizziness and loss of consciousness.  Endo/Heme/Allergies:  Does not bruise/bleed  easily.  Psychiatric/Behavioral:  Negative for substance abuse.     Physical Exam/Data:   Vitals:   08/22/22 0200 08/22/22 0251 08/22/22 0843 08/22/22 1300  BP: 120/86 137/64 (!) 162/77 (!) 154/65  Pulse: 64 (!) 57 78 63  Resp: (!) '22 17 18 17  ' Temp:  98 F (36.7 C)  98.3 F (36.8 C)  TempSrc:    Oral  SpO2: 96% 97% 97% 96%  Weight:  80.2 kg    Height:  5' (1.524 m)      Intake/Output Summary (Last 24 hours) at 08/22/2022 1412 Last data filed at 08/22/2022 0648 Gross per 24 hour  Intake 755.5 ml  Output --  Net 755.5 ml      08/22/2022    2:51 AM 08/21/2022    6:41 PM 06/21/2022    8:18 AM  Last 3 Weights  Weight (lbs) 176 lb 14.4 oz 175 lb 175 lb 0.7 oz  Weight (kg) 80.241 kg 79.379 kg 79.4 kg     Body mass index is 34.55 kg/m.  General: 81 y.o. obese Caucasian female resting comfortably in no acute distress. HEENT: Normocephalic and atraumatic. Sclera clear.  Neck: Supple. No JVD. Heart: RRR. Distinct S1 and S2. No murmurs, gallops, or rubs. Right radial pulse 2+ and equal bilaterally. Lungs: No increased work of breathing. Clear to ausculation bilaterally. No wheezes, rhonchi, or rales.  Abdomen: Soft, non-distended, and non-tender to palpation.  Extremities: No significant lower extremity edema.    Skin: Warm and dry. Neuro: Alert and oriented x3. No focal deficits. Psych: Normal affect. Responds appropriately.   EKG:  The EKG was personally reviewed and demonstrates:   - Initial EKG on  08/21/2022 at 18:50 showed regular narrow complex tachycardia with diffuse ST depression in inferior lateral leads. - EKG on 08/21/2022 at 20:25 showed atrial fibrillation/flutter, rate 127 bpm, with improvement in ST depression but still present. - EKG on 08/21/2022 at 22:03 showed atrial fibrillation/ flutter, rate 110 bpm, with ST elevation in aVR and continued slight ST T depression in the lateral leads.  Telemetry:  Telemetry was personally reviewed and demonstrates:  Normal  sinus rhythm with rates in the 50s to 60s. Occasional PVCs.  Relevant CV Studies:  Left Cardiac Catheterization 03/01/2020: Severe multivessel coronary disease Segmental proximal to mid 90% LAD stenosis.  Distal flow is TIMI grade II.  Right coronary supplies LAD via septal perforator collaterals. 90% mid to distal LAD before the second obtuse marginal.  100% circumflex after the second obtuse marginal. Total occlusion of distal RCA.  Left ventricular branch and PDA fill by collaterals from the left coronary. Distal left main calcified with 30 to 40% narrowing Basal inferior to mid inferior wall motion abnormality hypokinetic to akinetic.  EF 55%.  LVEDP 16 mmHg   Recommendations: T CTS consultation for coronary bypass surgery which should be done soon. Resume IV heparin Continue IV nitroglycerin Patient to notify of recurrent chest pain.  Diagnostic Dominance: Right  _______________  Echocardiogram 06/18/2022: Impressions:  1. Compared to echo report from March 2021, LVEF is improved.   2. Global longitudinal strain is normal at -23.3%. Left ventricular  ejection fraction, by estimation, is 60 to 65%. The left ventricle has  normal function. The left ventricle has no regional wall motion  abnormalities. There is mild left ventricular  hypertrophy. Left ventricular diastolic parameters are indeterminate.   3. Right ventricular systolic function is normal. The right ventricular  size is normal.   4. The mitral valve is normal in structure. Mild mitral valve  regurgitation.   5. The aortic valve is normal in structure. Aortic valve regurgitation is  not visualized.   6. The inferior vena cava is normal in size with greater than 50%  respiratory variability, suggesting right atrial pressure of 3 mmHg.    Laboratory Data:  High Sensitivity Troponin:   Recent Labs  Lab 08/21/22 1845 08/21/22 2052 08/22/22 0503  TROPONINIHS 10 94* 1,665*     Chemistry Recent Labs  Lab  08/21/22 1845  NA 141  K 3.9  CL 100  CO2 29  GLUCOSE 143*  BUN 14  CREATININE 1.18*  CALCIUM 10.4*  GFRNONAA 47*  ANIONGAP 12    No results for input(s): "PROT", "ALBUMIN", "AST", "ALT", "ALKPHOS", "BILITOT" in the last 168 hours. Lipids  Recent Labs  Lab 08/22/22 0503  CHOL 111  TRIG 106  HDL 44  LDLCALC 46  CHOLHDL 2.5    Hematology Recent Labs  Lab 08/21/22 1845 08/22/22 0503  WBC 12.3* 9.3  RBC 5.07 4.38  HGB 15.3* 13.3  HCT 46.1* 39.2  MCV 90.9 89.5  MCH 30.2 30.4  MCHC 33.2 33.9  RDW 13.1 13.1  PLT 308 238   Thyroid No results for input(s): "TSH", "FREET4" in the last 168 hours.  BNP Recent Labs  Lab 08/21/22 1845  BNP 229.9*    DDimer No results for input(s): "DDIMER" in the last 168 hours.   Radiology/Studies:  DG Chest Port 1 View  Result Date: 08/21/2022 CLINICAL DATA:  [id number]. ED Triage Notes - Pt from home stated that about an hour ago she started experiencing chest pain radiating into the left shoulder and into left jaw.  Pt Pt also states that she is SOB and nauseas. Pt has cardiac and stroke hstory EXAM: PORTABLE CHEST 1 VIEW COMPARISON:  Chest x-ray 07/12/2021, CT chest 06/20/2020 FINDINGS: Persistent cardiomegaly. The heart and mediastinal contours are unchanged. Atherosclerotic plaque. Surgical changes overlie the mediastinum. Low lung volumes. Retrocardiac airspace opacity. Chronic coarsened markings with no overt pulmonary edema. No pleural effusion. No pneumothorax. No acute osseous abnormality. Interval total reverse left shoulder arthroplasty. Right upper quadrant surgical clips. IMPRESSION: 1. Low lung volumes with developing retrocardiac airspace opacity. Followup PA and lateral chest X-ray is recommended in 3-4 weeks following therapy to ensure resolution and exclude underlying malignancy. 2.  Aortic Atherosclerosis (ICD10-I70.0). Electronically Signed   By: Iven Finn M.D.   On: 08/21/2022 19:35     Assessment and Plan:    NSTEMI CAD History of CAD s/p CABG x5 in 03/2020.  She now presents with chest pain with radiation to left neck, shoulder, and arm. Similar to symptom prior to CABG in 03/2020.  EKG showed atrial fibrillation/flutter, rate 140s bpm, with ST elevation in aVR and diffuse ST depression in inferior lateral leads.  ST changes did improve with improvement in rate but did not completely resolve.  High-sensitivity troponin 10 >> 94 >> 1,665. - She had another episode of chest pain earlier this morning but current chest pain free.  - Recent Echo in 06/2022 showed normal LV function. Will get limited Echo to look for wall motion abnormalities. - Continue IV Heparin. - Continue aspirin, beta-blocker, and high-intensity statin. - Recommended cardiac catheterization. Last dose of Eliquis was yesterday morning. Will repeat EKG now that she is back in sinus rhythm.   The patient understands that risks include but are not limited to stroke (1 in 1000), death (1 in 72), kidney failure [usually temporary] (1 in 500), bleeding (1 in 200), allergic reaction [possibly serious] (1 in 200), and agrees to proceed.   Paroxysmal Atrial Flutter with RVR History of Atrial Fibrillation Initially diagnosed in 03/2020 about 1 week after CABG. Presented with chest pain with associated palpitations and was found to be in rapid atrial flutter. Started on IV Cardizem and converted back to sinus rhythm shortly after midnight. - Maintaining sinus rhythm with rates in the 50s to 60s. - Potassium 3.9 on admission. Will check Magnesium and TSH. - Echo pending. - IV Cardizem was stopped earlier today. Agree with this. - Will increase Lopressor to 33m twice daily. - CHA2DS2-VASc = 9 (CAD, CHF, HTN, DM, CVA x2, female, age x2). On chronic anticoagulation with Eliquis at home but currently on IV Heparin.  Chronic HFpEF BNP mildly elevated in the 200s. Recent Echo in 06/2022 showed LVEF of 60-65% with no regional wall motion  abnormalities, normal RV, and mild MR. - Euvolemic on exam. - Will hold home Lasix in anticipation for cardiac catheterization. - Continue Losartan 227mdaily. - Will increase Lopressor to 2572mwice daily. - Continue to monitor volume status closely.  Hypertension BP mildly elevated. - Continue Lopressor and increase Losartan as above.  Hyperlipidemia Lipid panel this admission: Total Cholesterol 111, Triglycerides 106, HDL 44, LDL 46. - Continue Lipitor 37m22mily.  Type 2 Diabetes Mellitus Hemoglobin A1c 6.7% this admission. - Management per PCP.  CKD Stage IIIa Creatinine 1.18 on admission which seems to be around baseline. - Continue to monitor.   Risk Assessment/Risk Scores:   TIMI Risk Score for Unstable Angina or Non-ST Elevation MI:   The patient's TIMI risk score is 6, which indicates a  41% risk of all cause mortality, new or recurrent myocardial infarction or need for urgent revascularization in the next 14 days.{  New York Heart Association (NYHA) Functional Class NYHA Class II-III  CHA2DS2-VASc Score = 9  This indicates a 12.2% annual risk of stroke. The patient's score is based upon: CHF History: 1 HTN History: 1 Diabetes History: 1 Stroke History: 2 Vascular Disease History: 1 Age Score: 2 Gender Score: 1    For questions or updates, please contact [place] Please consult [email or web address] for contact info under    Signed, Darreld Mclean, PA-C  08/22/2022 2:12 PM   I have seen and examined the patient along with Darreld Mclean, PA-C .  I have reviewed the chart, notes and new data.  I agree with PA/NP's note.  Key new complaints: She presented with simultaneous atrial flutter with RVR and angina pectoris.  The atrial flutter abated around midnight.  She had another brief episode of atrial flutter with variable AV block and tachycardia around 6 AM which was asymptomatic.  She then had angina pectoris again around 930 this morning when  she was in normal sinus rhythm. Key examination changes: Normal cardiovascular exam other than occasional ectopic beats (PACs on telemetry). Key new findings / data: Initial ECG shows atrial flutter with RVR, prominent ST segment changes most obvious in the inferior leads, even when accounting for the prominent flutter waves.  ECG has not been repeated yet in sinus rhythm.  Troponin has peaked at 1665 and is trending downwards.  Preliminary review of echo images shows low normal left ventricular systolic function and no obvious regional wall motion abnormality.  PLAN: Sequence of symptoms and arrhythmia pattern suggest that the primary problem is acute coronary insufficiency with secondary arrhythmia.  She has experienced a small non-STEMI.  This is surprising since it's only been a couple of years since her CABG.  Recommend coronary angiography tomorrow, after anticoagulation effect has waned some (last dose of Eliquis 09/19 in the morning). This procedure has been fully reviewed with the patient and written informed consent has been obtained.   Sanda Klein, MD, [place] [phone] 08/22/2022, 5:18 PM

## 2022-08-22 NOTE — H&P (Signed)
History and Physical    Judy [name]:[id number] DOB: 07-08-1941 DOA: 08/21/2022  DOS: the patient was seen and examined on 08/21/2022  PCP: Michael Boston, MD   Patient coming from: Home  I have personally briefly reviewed patient's old medical records in [place]  CC: Chest pain HPI: 81 year old white female history of paroxysmal atrial fibrillation on Eliquis, coronary disease, type 2 diabetes, hypertension, hyperlipidemia presents to the ER today with onset of chest pain at home.  She states that she was cleaning her house including her sewing room when she developed substernal chest pain with radiation down her arm into her head.  She complained of a headache.  Mildly short of breath.  Pain radiating down her left arm.  She became concerned she may be having heart attack and was brought to the ER.  On arrival, patient was tachycardic with heart rates in the about 140s.  EKG obtained showed rapid atrial flutter.  She was started on a Cardizem drip.  Her first check troponins were elevated.  Cardiology was consulted.  She was placed on heparin drip.  While she was awaiting transfer to [facility], she converted to normal sinus rhythm.  Hospital service was not updated on this clinical change.  She arrived to [facility] off the Cardizem drip.  According to the [facility], Cardizem drip was discontinued at 0026  Patient is currently chest pain-free.     ED Course: EKG showed rapid atrial flutter.  Started on Cardizem drip.  Elevated troponin.  Started on IV heparin.  Review of Systems:  Review of Systems  Constitutional:  Negative for chills and fever.  HENT: Negative.    Eyes: Negative.   Respiratory:  Positive for shortness of breath.   Cardiovascular:  Positive for chest pain and palpitations.  Gastrointestinal: Negative.  Negative for heartburn, nausea and vomiting.  Genitourinary: Negative.  Negative for dysuria, frequency and urgency.  Musculoskeletal:  Negative.   Skin: Negative.   Neurological: Negative.   Endo/Heme/Allergies: Negative.   Psychiatric/Behavioral: Negative.    All other systems reviewed and are negative.   Past Medical History:  Diagnosis Date   Anxiety    Arthritis    Back pain    Coronary artery disease    Depression    Diabetes ([place])    Dyspnea    Gout    Heart attack [facility])    March 2021   History of kidney stones    Hyperlipidemia    Hypertension    Leg edema    Pancreatitis, chronic ([place]) 03/2016   Pneumonia due to COVID-19 virus 12/03/2019   Sleep apnea    C-PAP   Stroke [facility])    March 2021 Rt side weakness    Past Surgical History:  Procedure Laterality Date   BACK SURGERY  2004   BREAST LUMPECTOMY WITH RADIOACTIVE SEED LOCALIZATION Right 10/23/2019   Procedure: RIGHT BREAST LUMPECTOMY WITH RADIOACTIVE SEED LOCALIZATION;  Surgeon: Jovita Kussmaul, MD;  Location: [place];  Service: General;  Laterality: Right;   CHOLECYSTECTOMY  2001   cardiac event during surgery   COLONOSCOPY  2013   CORONARY ARTERY BYPASS GRAFT N/A 03/03/2020   Procedure: CORONARY ARTERY BYPASS GRAFTING (CABG), ON PUMP, TIMES FIVE, USING LEFT INTERNAL MAMMARY ARTERY AND ENDOSCOPICALLY HARVESTED BILATERAL GREATER SAPHENOUS VEINS -flow track only;  Surgeon: Lajuana Matte, MD;  Location: [place];  Service: Open Heart Surgery;  Laterality: N/A;   LEFT HEART CATH AND CORONARY ANGIOGRAPHY N/A  03/01/2020   Procedure: LEFT HEART CATH AND CORONARY ANGIOGRAPHY;  Surgeon: Belva Crome, MD;  Location: [place] CV LAB;  Service: Cardiovascular;  Laterality: N/A;   REVERSE SHOULDER ARTHROPLASTY Left 06/21/2022   Procedure: REVERSE SHOULDER ARTHROPLASTY;  Surgeon: Justice Britain, MD;  Location: [facility];  Service: Orthopedics;  Laterality: Left;  166mn   TEE WITHOUT CARDIOVERSION N/A 03/03/2020   Procedure: TRANSESOPHAGEAL ECHOCARDIOGRAM (TEE);  Surgeon: LLajuana Matte MD;  [place]  Service: Open Heart Surgery;   Laterality: N/A;     reports that she has never smoked. She has never used smokeless tobacco. She reports that she does not drink alcohol and does not use drugs.  No Known Allergies  Family History  Problem Relation Age of Onset   Dementia Mother    AAA (abdominal aortic aneurysm) Father    Breast cancer Maternal Aunt    Bladder Cancer Brother    Colon cancer Neg Hx    Stomach cancer Neg Hx    Esophageal cancer Neg Hx    Pancreatic cancer Neg Hx     Prior to Admission medications   Medication Sig Start Date End Date Taking? Authorizing Provider  Accu-Chek Softclix Lancets lancets USE TO CHECK BLOOD SUGAR TWICE DAILY 05/11/21   WElsie Stain MD  apixaban (ELIQUIS) 5 MG TABS tablet TAKE ONE TABLET BY MOUTH TWICE DAILY 07/23/22   O'Neal, WCassie Freer MD  aspirin EC 81 MG tablet Take 1 tablet (81 mg total) by mouth daily. Swallow whole. 02/23/21   O'Neal,Cassie Freer MD  atorvastatin (LIPITOR) 20 MG tablet Take 1 tablet (20 mg total) by mouth at bedtime. 05/04/20   O'Neal, WCassie Freer MD  Cyanocobalamin (B-12 COMPLIANCE INJECTION) 1000 MCG/ML KIT Inject 1,000 mcg into the muscle every 30 (thirty) days.    [provider]  DULoxetine (CYMBALTA) 60 MG capsule Take 60 mg by mouth daily.    [provider]  empagliflozin (JARDIANCE) 25 MG TABS tablet Take 25 mg by mouth at bedtime.    [provider]  furosemide (LASIX) 20 MG tablet Take 1 tablet (20 mg total) by mouth daily. 07/13/22   O'Neal, WCassie Freer MD  glimepiride (AMARYL) 1 MG tablet 1 tablet with breakfast or the first main meal of the day Orally Once a day for 30 days Patient not taking: Reported on 06/15/2022 06/12/22   [provider]  glucose blood (ACCU-CHEK AVIVA) test strip Test twice daily 04/04/20   WElsie Stain MD  losartan (COZAAR) 25 MG tablet Take 1 tablet (25 mg total) by mouth daily. 04/04/20   WElsie Stain MD  metFORMIN (GLUCOPHAGE-XR) 750 MG 24 hr tablet Take 750  mg by mouth daily with breakfast. 10/11/20   [provider]  metoprolol tartrate (LOPRESSOR) 25 MG tablet TAKE 1/2 TABLET BY MOUTH EVERY MORNING and TAKE 1/2 TABLET BY MOUTH EVERYDAY AT BEDTIME 03/12/22   O'Neal, WCassie Freer MD  NON FORMULARY 1 each by Other route See admin instructions. CPAP machine nightly    [provider]  ondansetron (ZOFRAN) 4 MG tablet Take 1 tablet (4 mg total) by mouth every 8 (eight) hours as needed for nausea or vomiting. 06/21/22   Shuford, TOlivia Mackie PA-C  oxyCODONE-acetaminophen (PERCOCET) 5-325 MG tablet Take 1 tablet by mouth every 4 (four) hours as needed (max 6 q). 06/21/22   Shuford, TOlivia Mackie PA-C  tiZANidine (ZANAFLEX) 4 MG tablet Take 1 tablet (4 mg total) by mouth every 8 (eight) hours as needed  for muscle spasms. 06/21/22 06/21/23  Shuford, Olivia Mackie, PA-C  traMADol (ULTRAM) 50 MG tablet Take 50-100 mg by mouth every 8 (eight) hours as needed for moderate pain. 02/14/22   [provider]  traZODone (DESYREL) 100 MG tablet Take 150 mg by mouth at bedtime. 04/28/20   [provider]    Physical Exam: Vitals:   08/22/22 0000 08/22/22 0100 08/22/22 0200 08/22/22 0251  BP: 119/66 121/63 120/86 137/64  Pulse: 72 64 64 (!) 57  Resp: 18 14 (!) 22 17  Temp:    98 F (36.7 C)  TempSrc:      SpO2: 92% 95% 96% 97%  Weight:    80.2 kg  Height:    5' (1.524 m)    Physical Exam Vitals and nursing note reviewed.  Constitutional:      General: She is not in acute distress.    Appearance: Normal appearance. She is obese. She is not ill-appearing, toxic-appearing or diaphoretic.  HENT:     Head: Normocephalic and atraumatic.     Nose: Nose normal.  Cardiovascular:     Rate and Rhythm: Normal rate and regular rhythm.     Pulses: Normal pulses.  Pulmonary:     Effort: Pulmonary effort is normal. No respiratory distress.     Breath sounds: Normal breath sounds. No wheezing or rales.  Abdominal:     General: Bowel sounds are normal. There  is no distension.     Palpations: Abdomen is soft.     Tenderness: There is no abdominal tenderness. There is no guarding or rebound.  Musculoskeletal:     Comments: Slight left ankle edema  Skin:    General: Skin is warm and dry.     Capillary Refill: Capillary refill takes less than 2 seconds.  Neurological:     Mental Status: She is alert and oriented to person, place, and time.     Comments: Hard of hearing      Labs on Admission: I have personally reviewed following labs and imaging studies  CBC: Recent Labs  Lab 08/21/22 1845  WBC 12.3*  HGB 15.3*  HCT 46.1*  MCV 90.9  PLT 601   Basic Metabolic Panel: Recent Labs  Lab 08/21/22 1845  NA 141  K 3.9  CL 100  CO2 29  GLUCOSE 143*  BUN 14  CREATININE 1.18*  CALCIUM 10.4*   GFR: Estimated Creatinine Clearance: 35.7 mL/min (A) (by C-G formula based on SCr of 1.18 mg/dL (H)). Liver Function Tests: No results for input(s): "AST", "ALT", "ALKPHOS", "BILITOT", "PROT", "ALBUMIN" in the last 168 hours. No results for input(s): "LIPASE", "AMYLASE" in the last 168 hours. No results for input(s): "AMMONIA" in the last 168 hours. Coagulation Profile: No results for input(s): "INR", "PROTIME" in the last 168 hours. Cardiac Enzymes: Recent Labs  Lab 08/21/22 1845 08/21/22 2052  TROPONINIHS 10 94*   BNP (last 3 results) Recent Labs    06/15/22 1433  PROBNP 88   HbA1C: No results for input(s): "HGBA1C" in the last 72 hours. CBG: No results for input(s): "GLUCAP" in the last 168 hours. Lipid Profile: No results for input(s): "CHOL", "HDL", "LDLCALC", "TRIG", "CHOLHDL", "LDLDIRECT" in the last 72 hours. Thyroid Function Tests: No results for input(s): "TSH", "T4TOTAL", "FREET4", "T3FREE", "THYROIDAB" in the last 72 hours. Anemia Panel: No results for input(s): "VITAMINB12", "FOLATE", "FERRITIN", "TIBC", "IRON", "RETICCTPCT" in the last 72 hours. Urine analysis:    Component Value Date/Time   COLORURINE  COLORLESS (A) 08/21/2022 1928  APPEARANCEUR CLEAR 08/21/2022 1928   LABSPEC 1.007 08/21/2022 1928   PHURINE 6.0 08/21/2022 1928   GLUCOSEU >1,000 (A) 08/21/2022 1928   HGBUR NEGATIVE 08/21/2022 1928   BILIRUBINUR NEGATIVE 08/21/2022 1928   KETONESUR NEGATIVE 08/21/2022 1928   PROTEINUR TRACE (A) 08/21/2022 1928   NITRITE NEGATIVE 08/21/2022 1928   LEUKOCYTESUR SMALL (A) 08/21/2022 1928    Radiological Exams on Admission: I have personally reviewed images DG Chest Port 1 View  Result Date: 08/21/2022 CLINICAL DATA:  [id number]. ED Triage Notes - Pt from home stated that about an hour ago she started experiencing chest pain radiating into the left shoulder and into left jaw. Pt Pt also states that she is SOB and nauseas. Pt has cardiac and stroke hstory EXAM: PORTABLE CHEST 1 VIEW COMPARISON:  Chest x-ray 07/12/2021, CT chest 06/20/2020 FINDINGS: Persistent cardiomegaly. The heart and mediastinal contours are unchanged. Atherosclerotic plaque. Surgical changes overlie the mediastinum. Low lung volumes. Retrocardiac airspace opacity. Chronic coarsened markings with no overt pulmonary edema. No pleural effusion. No pneumothorax. No acute osseous abnormality. Interval total reverse left shoulder arthroplasty. Right upper quadrant surgical clips. IMPRESSION: 1. Low lung volumes with developing retrocardiac airspace opacity. Followup PA and lateral chest X-ray is recommended in 3-4 weeks following therapy to ensure resolution and exclude underlying malignancy. 2.  Aortic Atherosclerosis (ICD10-I70.0). Electronically Signed   By: Iven Finn M.D.   On: 08/21/2022 19:35    EKG: My personal interpretation of EKG shows: rapid atrial flutter    Assessment/Plan Principal Problem:   Atrial flutter with rapid ventricular response (HCC) Active Problems:   HTN (hypertension)   OSA on CPAP   Class 2 severe obesity with serious comorbidity and body mass index (BMI) of 35.0 to 35.9 in adult, unspecified  obesity type ([place])   Type 2 diabetes mellitus, controlled ([place])   Hyperlipidemia associated with type 2 diabetes mellitus (HCC)   Elevated troponin I level    Assessment and Plan: * Atrial flutter with rapid ventricular response ([place]) Observation telemetry bed. Pt converted to NSR prior to transfer. Already on Eliquis at home. Check echo. Pt wants to be discharge ASAP as she is planning on going to beach later this week.  Elevated troponin I level Likely due to demand ischemia from rapid aflutter. Cycle enz.  Hyperlipidemia associated with type 2 diabetes mellitus (HCC) Stable. Continue lipitor 20 mg  Type 2 diabetes mellitus, controlled (HCC) Check A1c. Hold metformin. Continue jardiance. Add SSI.  OSA on CPAP Stable. Continue CPAP  HTN (hypertension) Stable. Continue cozaar and lopressor.   DVT prophylaxis: IV heparin gtts Code Status: Full Code Family Communication: no family at bedside  Disposition Plan: return home  Consults called: none  Admission status: Observation, Telemetry bed   Kristopher Oppenheim, DO [facility] 08/22/2022, 3:53 AM

## 2022-08-22 NOTE — Assessment & Plan Note (Addendum)
Observation telemetry bed. Pt converted to NSR prior to transfer. Already on Eliquis at home. Check echo. Pt wants to be discharge ASAP as she is planning on going to beach later this week.  I do not think the patient has UTI or pneumonia.  Stop antibiotics.

## 2022-08-22 NOTE — Progress Notes (Signed)
CPAP set up for pt and set by the bed. Pt will call if help is needed.

## 2022-08-22 NOTE — TOC Progression Note (Signed)
Transition of Care [facility] - Progression Note    Patient Details  Name: Judy [name] MRN: [id number] Date of Birth: 06/18/1941  [facility] of [facility] CM/SW Contact  Zenon Mayo, RN Phone Number: 08/22/2022, 3:55 PM  Clinical Narrative:    NCM spoke with patient at the bedside, she is form home with a room mate, presents with afib with RVR, trops elevated, plan for cath tomorrow, conts on Hep drip.  She states her son will transport her home at dc. TOC following.         Expected Discharge Plan and Services                                                 Social Determinants of Health (SDOH) Interventions    Readmission Risk Interventions    03/08/2020   10:09 AM 12/10/2019    3:23 PM  Readmission Risk Prevention Plan  Post Dischage Appt  Complete  Medication Screening  Complete  Transportation Screening Complete Complete  PCP or Specialist Appt within 5-7 Days Complete   Home Care Screening Complete   Medication Review (RN CM) Complete

## 2022-08-22 NOTE — Progress Notes (Signed)
ANTICOAGULATION CONSULT NOTE - follow-up Pharmacy Consult for heparin Indication: changed to ACS / STEMI. Patient has a history of AF Brief A/P: aPTT subtherapeutic after a rate change wihtout a bolus 2/2 AF indication that has now changed to ACS. Provide a partial bolus f/b increase Heparin rate  No Known Allergies  Patient Measurements: Height: 5' (152.4 cm) Weight: 80.2 kg (176 lb 14.4 oz) IBW/kg (Calculated) : 45.5 Heparin Dosing Weight: 63.6kg  Vital Signs: Temp: 98.3 F (36.8 C) (09/20 1300) Temp Source: Oral (09/20 1300) BP: 154/65 (09/20 1300) Pulse Rate: 63 (09/20 1300)  Labs: Recent Labs    08/21/22 1845 08/21/22 2052 08/22/22 0503 08/22/22 1500  HGB 15.3*  --  13.3  --   HCT 46.1*  --  39.2  --   PLT 308  --  238  --   APTT  --   --  50* 67*  HEPARINUNFRC  --   --  >1.10*  --   CREATININE 1.18*  --   --   --   TROPONINIHS 10 94* 1,665*  --      Estimated Creatinine Clearance: 35.7 mL/min (A) (by C-G formula based on SCr of 1.18 mg/dL (H)).  Assessment: 81 y.o. female admitted with chest pain, h/o Afib and Elquis on hold, for heparin. Current diagnosis is ACS  PTT came back in range this PM but on the lower side. We will increase slightly and check level in AM.   Goal of Therapy:  Heparin level 0.3-0.7 units/ml aPTT 66-102  seconds Monitor platelets by anticoagulation protocol: Yes   Plan:  Increase heparin to 1200 units/hr Check aPTT/HL in AM  Onnie Boer, PharmD, BCIDP, AAHIVP, CPP Infectious Disease Pharmacist 08/22/2022 4:17 PM

## 2022-08-22 NOTE — Assessment & Plan Note (Signed)
Stable. Continue cozaar and lopressor.

## 2022-08-22 NOTE — Care Management Obs Status (Signed)
[facility] NOTIFICATION   Patient Details  Name: Judy [name] MRN: [id number] Date of Birth: [birth year]   Medicare Observation Status Notification Given:  Yes    Zenon Mayo, RN 08/22/2022, 3:51 PM

## 2022-08-22 NOTE — Progress Notes (Signed)
  Echocardiogram 2D Echocardiogram has been performed.  Lana Fish 08/22/2022, 3:16 PM

## 2022-08-22 NOTE — Assessment & Plan Note (Signed)
Check A1c. Hold metformin. Continue jardiance. Add SSI.

## 2022-08-22 NOTE — H&P (View-Only) (Signed)
Cardiology Consultation   Patient ID: Judy [name] MRN: [id number]; DOB: 1941/04/30  Admit date: 08/21/2022 Date of Consult: 08/22/2022  PCP:  Michael Boston, MD   [facility] Providers Cardiologist:  Evalina Field, MD   {   Patient Profile:   Judy [name] is a 81 y.o. female with a history of CAD s/p CABG x5 (LIMA-LAD, SVG-OM2, SVG-OM3, SVG-1st Diag, SVG-PLV) in 03/2020, HFpEF, paroxysmal atrial fibrillation on Eliquis, hypertension, hyperlipdiemia, type 2 diabetes mellitus, CVA (post-cath complication), mild bilateral carotid stenosis, obstructive sleep apnea, and gout who is being seen 08/22/2022 for the evaluation of atrial flutter and elevated troponin at the request of Dr. Starla Link.  History of Present Illness:   Judy [name] is a 81 year old female with the above history who is followed by Dr. Audie Box.  Patient was admitted on 02/2020 with a NSTEMI.  Echo showed LVEF of 45-50% with inferior hypokinesis and mild LVH.  LHC at that time showed multivessel CAD.  CT surgery was consulted and she was felt to be a good candidate for bypass surgery. However, she ten developed some confusion following cath and brain MRI revealed a subcentimeter cerebellar infarct. After her mental status improved, she underwent CABG x5 with LIMA to LAD, SVG to OM 2, SVG to OM 3, SVG to 1st Diag, and SVG to PLV on 03/03/2022 with Dr. Kipp Brood. Post-op course was relatively unremarkable with expected anemia and volume overload. She returned to the ED on 03/11/2020 with chest tightness and was found to be in new onset atrial fibrillation with RVR. Aspirin was stopped and she was started on Eliquis in addition to Plavix. Fortunately, she converted back to sinus rhythm while in the ED. Patient was last seen by Christen Bame, NP, in 06/2022 at which time she reported shortness of breath as well as bilateral lower extremity edema. Pro BNP normal. Echo was ordered and showed LVEF of 60-65% with no regional wall  motion abnormalities, normal RV, and mild MR.  Presented to the ED on 08/21/1992 for further evaluation of chest pain.  Upon arrival to the ED, she was tachycardic and mildly hypertensive.  EKG shows atrial flutter, rate 148 bpm, with ST elevations in aVR, and diffuse ST depressions in inferior lateral leads.  Repeat EKGs after improvement in rate showed improvement in ST changes but they did not completely resolve.  Initial high-sensitivity troponin negative at 10 but delta positive at 94 >> 1,665.  BNP mildly elevated at 229. Chest x-ray showed low lung volumes with developing retrocardiac airspace opacity. WBC 12.3, Hgb 15.3, Plts 308. Na 141, K 3.9, Glucose 143, BUN 14, Cr 1.18.  Urinalysis showed > 1,000 glucose, trace protein, and small leukocytes.  Maneuvers were attempted in the ED improvement but flutter waves were reportedly seen.  He was started on IV Cardizem drip with bolus, IV heparin, and antibiotics for possible UTI and pneumonia.  At the time of this evaluation, patient is resting comfortably no acute distress.  She converted back to sinus rhythm shortly after midnight.  Rates currently in the 50s to 37s.  Patient states she was in her usual state of health until yesterday morning she developed sudden onset of substernal chest tightness with radiation to her left neck, shoulder, and arm with associated heart racing and shortness of breath. She also reports feeling very fatigued with this. This occurred while she was cleaning around her house.  Patient states that this does feel similar to her symptoms prior to  her CABG in 2021.  She knew that something was not right and had a friend take her to the ED.  She denies any recent chest pain prior to this.  She does have chronic dyspnea on exertion and states it has been getting worse.  No significant shortness of breath at rest.  No orthopnea or PND.  She has intermittent lower extremity swelling which is relatively well controlled on Lasix.  She  states she felt "off" yesterday during episode of chest tightness and heart racing but does not endorse any real dizziness or lightheadedness.  No syncope.  She denies any recent fevers or illnesses.  She does report a nonproductive cough but no other respiratory symptoms.  No nausea or vomiting.  No abnormal bleeding in urine or stools.  Patient states she had some recurrent chest pain earlier this morning but is currently chest pain free.  Past Medical History:  Diagnosis Date   Anxiety    Arthritis    Back pain    Coronary artery disease    Depression    Diabetes ([place])    Dyspnea    Gout    Heart attack [facility])    March 2021   History of kidney stones    Hyperlipidemia    Hypertension    Leg edema    Pancreatitis, chronic ([place]) 03/2016   Pneumonia due to COVID-19 virus 12/03/2019   Sleep apnea    C-PAP   Stroke [facility])    March 2021 Rt side weakness    Past Surgical History:  Procedure Laterality Date   BACK SURGERY  2004   BREAST LUMPECTOMY WITH RADIOACTIVE SEED LOCALIZATION Right 10/23/2019   Procedure: RIGHT BREAST LUMPECTOMY WITH RADIOACTIVE SEED LOCALIZATION;  Surgeon: Jovita Kussmaul, MD;  Location: [place];  Service: General;  Laterality: Right;   CHOLECYSTECTOMY  2001   cardiac event during surgery   COLONOSCOPY  2013   CORONARY ARTERY BYPASS GRAFT N/A 03/03/2020   Procedure: CORONARY ARTERY BYPASS GRAFTING (CABG), ON PUMP, TIMES FIVE, USING LEFT INTERNAL MAMMARY ARTERY AND ENDOSCOPICALLY HARVESTED BILATERAL GREATER SAPHENOUS VEINS -flow track only;  Surgeon: Lajuana Matte, MD;  Location: [place];  Service: Open Heart Surgery;  Laterality: N/A;   LEFT HEART CATH AND CORONARY ANGIOGRAPHY N/A 03/01/2020   Procedure: LEFT HEART CATH AND CORONARY ANGIOGRAPHY;  Surgeon: Belva Crome, MD;  Location: [place] CV LAB;  Service: Cardiovascular;  Laterality: N/A;   REVERSE SHOULDER ARTHROPLASTY Left 06/21/2022   Procedure: REVERSE SHOULDER ARTHROPLASTY;  Surgeon: Justice Britain, MD;  Location: [facility];  Service: Orthopedics;  Laterality: Left;  131mn   TEE WITHOUT CARDIOVERSION N/A 03/03/2020   Procedure: TRANSESOPHAGEAL ECHOCARDIOGRAM (TEE);  Surgeon: LLajuana Matte MD;  [place]  Service: Open Heart Surgery;  Laterality: N/A;     Home Medications:  Prior to Admission medications   Medication Sig Start Date End Date Taking? Authorizing Provider  apixaban (ELIQUIS) 5 MG TABS tablet TAKE ONE TABLET BY MOUTH TWICE DAILY 07/23/22  Yes O'Neal, WCassie Freer MD  aspirin EC 81 MG tablet Take 1 tablet (81 mg total) by mouth daily. Swallow whole. 02/23/21  Yes O'Neal, WCassie Freer MD  atorvastatin (LIPITOR) 20 MG tablet Take 1 tablet (20 mg total) by mouth at bedtime. 05/04/20  Yes O'Neal, WCassie Freer MD  Cyanocobalamin (B-12 COMPLIANCE INJECTION) 1000 MCG/ML KIT Inject 1,000 mcg into the muscle every 30 (thirty) days.   Yes [provider]  DULoxetine (CYMBALTA) 60 MG capsule Take  60 mg by mouth daily.   Yes [provider]  empagliflozin (JARDIANCE) 25 MG TABS tablet Take 25 mg by mouth at bedtime.   Yes [provider]  furosemide (LASIX) 20 MG tablet Take 1 tablet (20 mg total) by mouth daily. 07/13/22  Yes O'Neal, Cassie Freer, MD  losartan (COZAAR) 25 MG tablet Take 1 tablet (25 mg total) by mouth daily. 04/04/20  Yes Elsie Stain, MD  metFORMIN (GLUCOPHAGE-XR) 750 MG 24 hr tablet Take 750 mg by mouth daily with breakfast. 10/11/20  Yes [provider]  metoprolol tartrate (LOPRESSOR) 25 MG tablet TAKE 1/2 TABLET BY MOUTH EVERY MORNING and TAKE 1/2 TABLET BY MOUTH EVERYDAY AT BEDTIME 03/12/22  Yes O'Neal, Cassie Freer, MD  ondansetron (ZOFRAN) 4 MG tablet Take 1 tablet (4 mg total) by mouth every 8 (eight) hours as needed for nausea or vomiting. 06/21/22  Yes Shuford, Olivia Mackie, PA-C  tiZANidine (ZANAFLEX) 4 MG tablet Take 1 tablet (4 mg total) by mouth every 8 (eight) hours as needed for muscle spasms. 06/21/22  06/21/23 Yes Shuford, Olivia Mackie, PA-C  traMADol (ULTRAM) 50 MG tablet Take 50-100 mg by mouth every 8 (eight) hours as needed for moderate pain. 02/14/22  Yes [provider]  traZODone (DESYREL) 100 MG tablet Take 150 mg by mouth at bedtime. 04/28/20  Yes [provider]  Accu-Chek Softclix Lancets lancets USE TO CHECK BLOOD SUGAR TWICE DAILY 05/11/21   Elsie Stain, MD  glimepiride (AMARYL) 1 MG tablet 1 tablet with breakfast or the first main meal of the day Orally Once a day for 30 days Patient not taking: Reported on 06/15/2022 06/12/22   [provider]  glucose blood (ACCU-CHEK AVIVA) test strip Test twice daily 04/04/20   Elsie Stain, MD  NON FORMULARY 1 each by Other route See admin instructions. CPAP machine nightly    [provider]  oxyCODONE-acetaminophen (PERCOCET) 5-325 MG tablet Take 1 tablet by mouth every 4 (four) hours as needed (max 6 q). Patient not taking: Reported on 08/22/2022 06/21/22   Jenetta Loges, PA-C    Inpatient Medications: Scheduled Meds:  aspirin EC  81 mg Oral Daily   atorvastatin  20 mg Oral QHS   empagliflozin  25 mg Oral QHS   furosemide  20 mg Oral Daily   insulin aspart  0-15 Units Subcutaneous TID WC   insulin aspart  0-5 Units Subcutaneous QHS   losartan  25 mg Oral Daily   metoprolol tartrate  12.5 mg Oral BID   Continuous Infusions:  heparin 1,150 Units/hr (08/22/22 1007)   PRN Meds: acetaminophen **OR** acetaminophen, ondansetron **OR** ondansetron (ZOFRAN) IV  Allergies:   No Known Allergies  Social History:   Social History   Socioeconomic History   Marital status: Divorced    Spouse name: Not on file   Number of children: 1   Years of education: 12   Highest education level: Some college, no degree  Occupational History   Occupation: CNA  Tobacco Use   Smoking status: Never   Smokeless tobacco: Never  Vaping Use   Vaping Use: Never used  Substance and Sexual Activity   Alcohol use: No     Alcohol/week: 0.0 standard drinks of alcohol   Drug use: No   Sexual activity: Not Currently  Other Topics Concern   Not on file  Social History Narrative   Lives with Roommate   Drinks 32 ounces of caffeine daily   Right Handed   Social Determinants  of Health   Financial Resource Strain: Not on file  Food Insecurity: No Food Insecurity (08/22/2022)   Hunger Vital Sign    Worried About Running Out of Food in the Last Year: Never true    Ran Out of Food in the Last Year: Never true  Transportation Needs: No Transportation Needs (08/22/2022)   PRAPARE - Hydrologist (Medical): No    Lack of Transportation (Non-Medical): No  Physical Activity: Not on file  Stress: Not on file  Social Connections: Not on file  Intimate Partner Violence: Not At Risk (08/22/2022)   Humiliation, Afraid, Rape, and Kick questionnaire    Fear of Current or Ex-Partner: No    Emotionally Abused: No    Physically Abused: No    Sexually Abused: No    Family History:   Family History  Problem Relation Age of Onset   Dementia Mother    AAA (abdominal aortic aneurysm) Father    Breast cancer Maternal Aunt    Bladder Cancer Brother    Colon cancer Neg Hx    Stomach cancer Neg Hx    Esophageal cancer Neg Hx    Pancreatic cancer Neg Hx      ROS:  Please see the history of present illness.  Review of Systems  Constitutional:  Positive for malaise/fatigue. Negative for fever.  HENT:  Negative for congestion.   Respiratory:  Positive for cough and shortness of breath. Negative for sputum production.   Cardiovascular:  Positive for chest pain, palpitations and leg swelling. Negative for orthopnea and PND.  Gastrointestinal:  Negative for blood in stool, melena, nausea and vomiting.  Genitourinary:  Negative for hematuria.  Musculoskeletal:  Negative for myalgias.  Neurological:  Negative for dizziness and loss of consciousness.  Endo/Heme/Allergies:  Does not bruise/bleed  easily.  Psychiatric/Behavioral:  Negative for substance abuse.     Physical Exam/Data:   Vitals:   08/22/22 0200 08/22/22 0251 08/22/22 0843 08/22/22 1300  BP: 120/86 137/64 (!) 162/77 (!) 154/65  Pulse: 64 (!) 57 78 63  Resp: (!) '22 17 18 17  ' Temp:  98 F (36.7 C)  98.3 F (36.8 C)  TempSrc:    Oral  SpO2: 96% 97% 97% 96%  Weight:  80.2 kg    Height:  5' (1.524 m)      Intake/Output Summary (Last 24 hours) at 08/22/2022 1412 Last data filed at 08/22/2022 0648 Gross per 24 hour  Intake 755.5 ml  Output --  Net 755.5 ml      08/22/2022    2:51 AM 08/21/2022    6:41 PM 06/21/2022    8:18 AM  Last 3 Weights  Weight (lbs) 176 lb 14.4 oz 175 lb 175 lb 0.7 oz  Weight (kg) 80.241 kg 79.379 kg 79.4 kg     Body mass index is 34.55 kg/m.  General: 81 y.o. obese Caucasian female resting comfortably in no acute distress. HEENT: Normocephalic and atraumatic. Sclera clear.  Neck: Supple. No JVD. Heart: RRR. Distinct S1 and S2. No murmurs, gallops, or rubs. Right radial pulse 2+ and equal bilaterally. Lungs: No increased work of breathing. Clear to ausculation bilaterally. No wheezes, rhonchi, or rales.  Abdomen: Soft, non-distended, and non-tender to palpation.  Extremities: No significant lower extremity edema.    Skin: Warm and dry. Neuro: Alert and oriented x3. No focal deficits. Psych: Normal affect. Responds appropriately.   EKG:  The EKG was personally reviewed and demonstrates:   - Initial EKG on  08/21/2022 at 18:50 showed regular narrow complex tachycardia with diffuse ST depression in inferior lateral leads. - EKG on 08/21/2022 at 20:25 showed atrial fibrillation/flutter, rate 127 bpm, with improvement in ST depression but still present. - EKG on 08/21/2022 at 22:03 showed atrial fibrillation/ flutter, rate 110 bpm, with ST elevation in aVR and continued slight ST T depression in the lateral leads.  Telemetry:  Telemetry was personally reviewed and demonstrates:  Normal  sinus rhythm with rates in the 50s to 60s. Occasional PVCs.  Relevant CV Studies:  Left Cardiac Catheterization 03/01/2020: Severe multivessel coronary disease Segmental proximal to mid 90% LAD stenosis.  Distal flow is TIMI grade II.  Right coronary supplies LAD via septal perforator collaterals. 90% mid to distal LAD before the second obtuse marginal.  100% circumflex after the second obtuse marginal. Total occlusion of distal RCA.  Left ventricular branch and PDA fill by collaterals from the left coronary. Distal left main calcified with 30 to 40% narrowing Basal inferior to mid inferior wall motion abnormality hypokinetic to akinetic.  EF 55%.  LVEDP 16 mmHg   Recommendations: T CTS consultation for coronary bypass surgery which should be done soon. Resume IV heparin Continue IV nitroglycerin Patient to notify of recurrent chest pain.  Diagnostic Dominance: Right  _______________  Echocardiogram 06/18/2022: Impressions:  1. Compared to echo report from March 2021, LVEF is improved.   2. Global longitudinal strain is normal at -23.3%. Left ventricular  ejection fraction, by estimation, is 60 to 65%. The left ventricle has  normal function. The left ventricle has no regional wall motion  abnormalities. There is mild left ventricular  hypertrophy. Left ventricular diastolic parameters are indeterminate.   3. Right ventricular systolic function is normal. The right ventricular  size is normal.   4. The mitral valve is normal in structure. Mild mitral valve  regurgitation.   5. The aortic valve is normal in structure. Aortic valve regurgitation is  not visualized.   6. The inferior vena cava is normal in size with greater than 50%  respiratory variability, suggesting right atrial pressure of 3 mmHg.    Laboratory Data:  High Sensitivity Troponin:   Recent Labs  Lab 08/21/22 1845 08/21/22 2052 08/22/22 0503  TROPONINIHS 10 94* 1,665*     Chemistry Recent Labs  Lab  08/21/22 1845  NA 141  K 3.9  CL 100  CO2 29  GLUCOSE 143*  BUN 14  CREATININE 1.18*  CALCIUM 10.4*  GFRNONAA 47*  ANIONGAP 12    No results for input(s): "PROT", "ALBUMIN", "AST", "ALT", "ALKPHOS", "BILITOT" in the last 168 hours. Lipids  Recent Labs  Lab 08/22/22 0503  CHOL 111  TRIG 106  HDL 44  LDLCALC 46  CHOLHDL 2.5    Hematology Recent Labs  Lab 08/21/22 1845 08/22/22 0503  WBC 12.3* 9.3  RBC 5.07 4.38  HGB 15.3* 13.3  HCT 46.1* 39.2  MCV 90.9 89.5  MCH 30.2 30.4  MCHC 33.2 33.9  RDW 13.1 13.1  PLT 308 238   Thyroid No results for input(s): "TSH", "FREET4" in the last 168 hours.  BNP Recent Labs  Lab 08/21/22 1845  BNP 229.9*    DDimer No results for input(s): "DDIMER" in the last 168 hours.   Radiology/Studies:  DG Chest Port 1 View  Result Date: 08/21/2022 CLINICAL DATA:  [id number]. ED Triage Notes - Pt from home stated that about an hour ago she started experiencing chest pain radiating into the left shoulder and into left jaw.  Pt Pt also states that she is SOB and nauseas. Pt has cardiac and stroke hstory EXAM: PORTABLE CHEST 1 VIEW COMPARISON:  Chest x-ray 07/12/2021, CT chest 06/20/2020 FINDINGS: Persistent cardiomegaly. The heart and mediastinal contours are unchanged. Atherosclerotic plaque. Surgical changes overlie the mediastinum. Low lung volumes. Retrocardiac airspace opacity. Chronic coarsened markings with no overt pulmonary edema. No pleural effusion. No pneumothorax. No acute osseous abnormality. Interval total reverse left shoulder arthroplasty. Right upper quadrant surgical clips. IMPRESSION: 1. Low lung volumes with developing retrocardiac airspace opacity. Followup PA and lateral chest X-ray is recommended in 3-4 weeks following therapy to ensure resolution and exclude underlying malignancy. 2.  Aortic Atherosclerosis (ICD10-I70.0). Electronically Signed   By: Iven Finn M.D.   On: 08/21/2022 19:35     Assessment and Plan:    NSTEMI CAD History of CAD s/p CABG x5 in 03/2020.  She now presents with chest pain with radiation to left neck, shoulder, and arm. Similar to symptom prior to CABG in 03/2020.  EKG showed atrial fibrillation/flutter, rate 140s bpm, with ST elevation in aVR and diffuse ST depression in inferior lateral leads.  ST changes did improve with improvement in rate but did not completely resolve.  High-sensitivity troponin 10 >> 94 >> 1,665. - She had another episode of chest pain earlier this morning but current chest pain free.  - Recent Echo in 06/2022 showed normal LV function. Will get limited Echo to look for wall motion abnormalities. - Continue IV Heparin. - Continue aspirin, beta-blocker, and high-intensity statin. - Recommended cardiac catheterization. Last dose of Eliquis was yesterday morning. Will repeat EKG now that she is back in sinus rhythm.   The patient understands that risks include but are not limited to stroke (1 in 1000), death (1 in 82), kidney failure [usually temporary] (1 in 500), bleeding (1 in 200), allergic reaction [possibly serious] (1 in 200), and agrees to proceed.   Paroxysmal Atrial Flutter with RVR History of Atrial Fibrillation Initially diagnosed in 03/2020 about 1 week after CABG. Presented with chest pain with associated palpitations and was found to be in rapid atrial flutter. Started on IV Cardizem and converted back to sinus rhythm shortly after midnight. - Maintaining sinus rhythm with rates in the 50s to 60s. - Potassium 3.9 on admission. Will check Magnesium and TSH. - Echo pending. - IV Cardizem was stopped earlier today. Agree with this. - Will increase Lopressor to 11m twice daily. - CHA2DS2-VASc = 9 (CAD, CHF, HTN, DM, CVA x2, female, age x2). On chronic anticoagulation with Eliquis at home but currently on IV Heparin.  Chronic HFpEF BNP mildly elevated in the 200s. Recent Echo in 06/2022 showed LVEF of 60-65% with no regional wall motion  abnormalities, normal RV, and mild MR. - Euvolemic on exam. - Will hold home Lasix in anticipation for cardiac catheterization. - Continue Losartan 265mdaily. - Will increase Lopressor to 2526mwice daily. - Continue to monitor volume status closely.  Hypertension BP mildly elevated. - Continue Lopressor and increase Losartan as above.  Hyperlipidemia Lipid panel this admission: Total Cholesterol 111, Triglycerides 106, HDL 44, LDL 46. - Continue Lipitor 56m54mily.  Type 2 Diabetes Mellitus Hemoglobin A1c 6.7% this admission. - Management per PCP.  CKD Stage IIIa Creatinine 1.18 on admission which seems to be around baseline. - Continue to monitor.   Risk Assessment/Risk Scores:   TIMI Risk Score for Unstable Angina or Non-ST Elevation MI:   The patient's TIMI risk score is 6, which indicates a  41% risk of all cause mortality, new or recurrent myocardial infarction or need for urgent revascularization in the next 14 days.{  New York Heart Association (NYHA) Functional Class NYHA Class II-III  CHA2DS2-VASc Score = 9  This indicates a 12.2% annual risk of stroke. The patient's score is based upon: CHF History: 1 HTN History: 1 Diabetes History: 1 Stroke History: 2 Vascular Disease History: 1 Age Score: 2 Gender Score: 1    For questions or updates, please contact [place] Please consult [email or web address] for contact info under    Signed, Darreld Mclean, PA-C  08/22/2022 2:12 PM   I have seen and examined the patient along with Darreld Mclean, PA-C .  I have reviewed the chart, notes and new data.  I agree with PA/NP's note.  Key new complaints: She presented with simultaneous atrial flutter with RVR and angina pectoris.  The atrial flutter abated around midnight.  She had another brief episode of atrial flutter with variable AV block and tachycardia around 6 AM which was asymptomatic.  She then had angina pectoris again around 930 this morning when  she was in normal sinus rhythm. Key examination changes: Normal cardiovascular exam other than occasional ectopic beats (PACs on telemetry). Key new findings / data: Initial ECG shows atrial flutter with RVR, prominent ST segment changes most obvious in the inferior leads, even when accounting for the prominent flutter waves.  ECG has not been repeated yet in sinus rhythm.  Troponin has peaked at 1665 and is trending downwards.  Preliminary review of echo images shows low normal left ventricular systolic function and no obvious regional wall motion abnormality.  PLAN: Sequence of symptoms and arrhythmia pattern suggest that the primary problem is acute coronary insufficiency with secondary arrhythmia.  She has experienced a small non-STEMI.  This is surprising since it's only been a couple of years since her CABG.  Recommend coronary angiography tomorrow, after anticoagulation effect has waned some (last dose of Eliquis 09/19 in the morning). This procedure has been fully reviewed with the patient and written informed consent has been obtained.   Sanda Klein, MD, [place] [phone] 08/22/2022, 5:18 PM

## 2022-08-22 NOTE — Subjective & Objective (Addendum)
CC: Chest pain HPI: 81 year old white female history of paroxysmal atrial fibrillation on Eliquis, coronary disease, type 2 diabetes, hypertension, hyperlipidemia presents to the ER today with onset of chest pain at home.  She states that she was cleaning her house including her sewing room when she developed substernal chest pain with radiation down her arm into her head.  She complained of a headache.  Mildly short of breath.  Pain radiating down her left arm.  She became concerned she may be having heart attack and was brought to the ER.  On arrival, patient was tachycardic with heart rates in the about 140s.  EKG obtained showed rapid atrial flutter.  She was started on a Cardizem drip.  Her first check troponins were elevated.  Cardiology was consulted.  She was placed on heparin drip.  While she was awaiting transfer to [facility], she converted to normal sinus rhythm.  Hospital service was not updated on this clinical change.  She arrived to [facility] off the Cardizem drip.  According to the [facility], Cardizem drip was discontinued at 0026  Patient is currently chest pain-free.

## 2022-08-22 NOTE — Assessment & Plan Note (Signed)
Likely due to demand ischemia from rapid aflutter. Cycle enz.

## 2022-08-22 NOTE — Assessment & Plan Note (Signed)
Stable. Continue lipitor 20 mg

## 2022-08-22 NOTE — Progress Notes (Signed)
Date and time results received: 08/22/22 7:43 AM  (use smartphrase ".now" to insert current time)  Test: troponin  Critical Value: 1,665  Name of Provider Notified: Starla Link MD

## 2022-08-23 ENCOUNTER — Encounter (HOSPITAL_COMMUNITY)
Admission: EM | Disposition: A | Discharge status: Home / Self Care | Payer: Self-pay | Source: Home / Self Care | Attending: Internal Medicine

## 2022-08-23 ENCOUNTER — Encounter (HOSPITAL_COMMUNITY): Payer: Self-pay | Admitting: Cardiology

## 2022-08-23 DIAGNOSIS — E785 Hyperlipidemia, unspecified: Secondary | ICD-10-CM | POA: Diagnosis present

## 2022-08-23 DIAGNOSIS — I252 Old myocardial infarction: Secondary | ICD-10-CM | POA: Diagnosis not present

## 2022-08-23 DIAGNOSIS — I4892 Unspecified atrial flutter: Secondary | ICD-10-CM | POA: Diagnosis not present

## 2022-08-23 DIAGNOSIS — G4733 Obstructive sleep apnea (adult) (pediatric): Secondary | ICD-10-CM | POA: Diagnosis present

## 2022-08-23 DIAGNOSIS — Z79899 Other long term (current) drug therapy: Secondary | ICD-10-CM | POA: Diagnosis not present

## 2022-08-23 DIAGNOSIS — Z20822 Contact with and (suspected) exposure to covid-19: Secondary | ICD-10-CM | POA: Diagnosis present

## 2022-08-23 DIAGNOSIS — I2510 Atherosclerotic heart disease of native coronary artery without angina pectoris: Secondary | ICD-10-CM

## 2022-08-23 DIAGNOSIS — I25110 Atherosclerotic heart disease of native coronary artery with unstable angina pectoris: Secondary | ICD-10-CM | POA: Diagnosis present

## 2022-08-23 DIAGNOSIS — I25700 Atherosclerosis of coronary artery bypass graft(s), unspecified, with unstable angina pectoris: Secondary | ICD-10-CM | POA: Diagnosis present

## 2022-08-23 DIAGNOSIS — E1122 Type 2 diabetes mellitus with diabetic chronic kidney disease: Secondary | ICD-10-CM | POA: Diagnosis present

## 2022-08-23 DIAGNOSIS — I484 Atypical atrial flutter: Secondary | ICD-10-CM | POA: Diagnosis present

## 2022-08-23 DIAGNOSIS — I214 Non-ST elevation (NSTEMI) myocardial infarction: Secondary | ICD-10-CM | POA: Diagnosis present

## 2022-08-23 DIAGNOSIS — Z8616 Personal history of COVID-19: Secondary | ICD-10-CM | POA: Diagnosis not present

## 2022-08-23 DIAGNOSIS — Z8673 Personal history of transient ischemic attack (TIA), and cerebral infarction without residual deficits: Secondary | ICD-10-CM | POA: Diagnosis not present

## 2022-08-23 DIAGNOSIS — I5032 Chronic diastolic (congestive) heart failure: Secondary | ICD-10-CM | POA: Diagnosis present

## 2022-08-23 DIAGNOSIS — E1165 Type 2 diabetes mellitus with hyperglycemia: Secondary | ICD-10-CM | POA: Diagnosis present

## 2022-08-23 DIAGNOSIS — I2582 Chronic total occlusion of coronary artery: Secondary | ICD-10-CM | POA: Diagnosis present

## 2022-08-23 DIAGNOSIS — Z8052 Family history of malignant neoplasm of bladder: Secondary | ICD-10-CM | POA: Diagnosis not present

## 2022-08-23 DIAGNOSIS — F32A Depression, unspecified: Secondary | ICD-10-CM | POA: Diagnosis present

## 2022-08-23 DIAGNOSIS — R778 Other specified abnormalities of plasma proteins: Secondary | ICD-10-CM | POA: Diagnosis not present

## 2022-08-23 DIAGNOSIS — Z6833 Body mass index (BMI) 33.0-33.9, adult: Secondary | ICD-10-CM | POA: Diagnosis not present

## 2022-08-23 DIAGNOSIS — E1169 Type 2 diabetes mellitus with other specified complication: Secondary | ICD-10-CM | POA: Diagnosis present

## 2022-08-23 DIAGNOSIS — E669 Obesity, unspecified: Secondary | ICD-10-CM | POA: Diagnosis present

## 2022-08-23 DIAGNOSIS — I480 Paroxysmal atrial fibrillation: Secondary | ICD-10-CM | POA: Diagnosis present

## 2022-08-23 DIAGNOSIS — Z803 Family history of malignant neoplasm of breast: Secondary | ICD-10-CM | POA: Diagnosis not present

## 2022-08-23 DIAGNOSIS — N1831 Chronic kidney disease, stage 3a: Secondary | ICD-10-CM | POA: Diagnosis present

## 2022-08-23 DIAGNOSIS — I130 Hypertensive heart and chronic kidney disease with heart failure and stage 1 through stage 4 chronic kidney disease, or unspecified chronic kidney disease: Secondary | ICD-10-CM | POA: Diagnosis present

## 2022-08-23 HISTORY — PX: LEFT HEART CATH AND CORS/GRAFTS ANGIOGRAPHY: CATH118250

## 2022-08-23 LAB — COMPREHENSIVE METABOLIC PANEL
ALT: 16 U/L (ref 0–44)
AST: 20 U/L (ref 15–41)
Albumin: 3.5 g/dL (ref 3.5–5.0)
Alkaline Phosphatase: 102 U/L (ref 38–126)
Anion gap: 9 (ref 5–15)
BUN: 18 mg/dL (ref 8–23)
CO2: 25 mmol/L (ref 22–32)
Calcium: 9.4 mg/dL (ref 8.9–10.3)
Chloride: 105 mmol/L (ref 98–111)
Creatinine, Ser: 1.17 mg/dL — ABNORMAL HIGH (ref 0.44–1.00)
GFR, Estimated: 47 mL/min — ABNORMAL LOW (ref >60)
Glucose, Bld: 149 mg/dL — ABNORMAL HIGH (ref 70–99)
Potassium: 3.4 mmol/L — ABNORMAL LOW (ref 3.5–5.1)
Sodium: 139 mmol/L (ref 135–145)
Total Bilirubin: 0.5 mg/dL (ref 0.3–1.2)
Total Protein: 6.4 g/dL — ABNORMAL LOW (ref 6.5–8.1)

## 2022-08-23 LAB — CBC
HCT: 43.5 % (ref 36.0–46.0)
Hemoglobin: 14.8 g/dL (ref 12.0–15.0)
MCH: 30.6 pg (ref 26.0–34.0)
MCHC: 34 g/dL (ref 30.0–36.0)
MCV: 90.1 fL (ref 80.0–100.0)
Platelets: 253 10*3/uL (ref 150–400)
RBC: 4.83 MIL/uL (ref 3.87–5.11)
RDW: 13.1 % (ref 11.5–15.5)
WBC: 9 10*3/uL (ref 4.0–10.5)
nRBC: 0 % (ref 0.0–0.2)

## 2022-08-23 LAB — GLUCOSE, CAPILLARY
Glucose-Capillary: 140 mg/dL — ABNORMAL HIGH (ref 70–99)
Glucose-Capillary: 148 mg/dL — ABNORMAL HIGH (ref 70–99)
Glucose-Capillary: 145 mg/dL — ABNORMAL HIGH (ref 70–99)
Glucose-Capillary: 126 mg/dL — ABNORMAL HIGH (ref 70–99)

## 2022-08-23 LAB — APTT: aPTT: 73 seconds — ABNORMAL HIGH (ref 24–36)

## 2022-08-23 LAB — MAGNESIUM: Magnesium: 1.9 mg/dL (ref 1.7–2.4)

## 2022-08-23 LAB — TSH: TSH: 3.495 u[IU]/mL (ref 0.350–4.500)

## 2022-08-23 LAB — HEPARIN LEVEL (UNFRACTIONATED): Heparin Unfractionated: >1.1 IU/mL — ABNORMAL HIGH (ref 0.30–0.70)

## 2022-08-23 SURGERY — LEFT HEART CATH AND CORS/GRAFTS ANGIOGRAPHY
Anesthesia: LOCAL

## 2022-08-23 MED ORDER — VERAPAMIL HCL 2.5 MG/ML IV SOLN
INTRAVENOUS | Status: DC | PRN
  Administered 2022-08-23: 10 mL via INTRA_ARTERIAL

## 2022-08-23 MED ORDER — HEPARIN SODIUM (PORCINE) 5000 UNIT/ML IJ SOLN: 5000.0000 [IU] | Freq: Three times a day (TID) | INTRAMUSCULAR | Status: DC

## 2022-08-23 MED ORDER — SODIUM CHLORIDE 0.9% FLUSH: 3.0000 mL | INTRAVENOUS | Status: DC | PRN

## 2022-08-23 MED ORDER — LIDOCAINE HCL (PF) 1 % IJ SOLN
INTRAMUSCULAR | Status: DC | PRN
  Administered 2022-08-23: 2 mL

## 2022-08-23 MED ORDER — HEPARIN SODIUM (PORCINE) 1000 UNIT/ML IJ SOLN
INTRAMUSCULAR | Status: AC
  Filled 2022-08-23: qty 10

## 2022-08-23 MED ORDER — VERAPAMIL HCL 2.5 MG/ML IV SOLN
INTRAVENOUS | Status: AC
  Filled 2022-08-23: qty 2

## 2022-08-23 MED ORDER — HEPARIN (PORCINE) IN NACL 1000-0.9 UT/500ML-% IV SOLN
INTRAVENOUS | Status: AC
  Filled 2022-08-23: qty 1000

## 2022-08-23 MED ORDER — ISOSORBIDE MONONITRATE ER 30 MG PO TB24
30.0000 mg | ORAL_TABLET | Freq: Every day | ORAL | Status: DC
  Administered 2022-08-23 – 2022-08-24 (×2): 30 mg via ORAL
  Filled 2022-08-23 (×2): qty 1

## 2022-08-23 MED ORDER — POTASSIUM CHLORIDE CRYS ER 20 MEQ PO TBCR
40.0000 meq | EXTENDED_RELEASE_TABLET | Freq: Two times a day (BID) | ORAL | Status: AC
  Administered 2022-08-23 (×2): 40 meq via ORAL
  Filled 2022-08-23 (×2): qty 2

## 2022-08-23 MED ORDER — POTASSIUM CHLORIDE CRYS ER 20 MEQ PO TBCR
EXTENDED_RELEASE_TABLET | ORAL | Status: AC
  Filled 2022-08-23: qty 1

## 2022-08-23 MED ORDER — FENTANYL CITRATE (PF) 100 MCG/2ML IJ SOLN
INTRAMUSCULAR | Status: AC
  Filled 2022-08-23: qty 2

## 2022-08-23 MED ORDER — MIDAZOLAM HCL 2 MG/2ML IJ SOLN
INTRAMUSCULAR | Status: AC
  Filled 2022-08-23: qty 2

## 2022-08-23 MED ORDER — POTASSIUM CHLORIDE CRYS ER 20 MEQ PO TBCR: 20.0000 meq | EXTENDED_RELEASE_TABLET | Freq: Once | ORAL | Status: DC

## 2022-08-23 MED ORDER — SODIUM CHLORIDE 0.9% FLUSH
3.0000 mL | Freq: Two times a day (BID) | INTRAVENOUS | Status: DC
  Administered 2022-08-23: 3 mL via INTRAVENOUS

## 2022-08-23 MED ORDER — FENTANYL CITRATE (PF) 100 MCG/2ML IJ SOLN
INTRAMUSCULAR | Status: DC | PRN
  Administered 2022-08-23: 50 ug via INTRAVENOUS
  Administered 2022-08-23: 25 ug via INTRAVENOUS

## 2022-08-23 MED ORDER — HYDRALAZINE HCL 20 MG/ML IJ SOLN: 10.0000 mg | INTRAMUSCULAR | Status: AC | PRN

## 2022-08-23 MED ORDER — IOHEXOL 350 MG/ML SOLN
INTRAVENOUS | Status: DC | PRN
  Administered 2022-08-23: 108 mL

## 2022-08-23 MED ORDER — SODIUM CHLORIDE 0.9 % IV SOLN: INTRAVENOUS | Status: DC

## 2022-08-23 MED ORDER — SPIRONOLACTONE 12.5 MG HALF TABLET
12.5000 mg | ORAL_TABLET | Freq: Every day | ORAL | Status: DC
  Administered 2022-08-23 – 2022-08-24 (×2): 12.5 mg via ORAL
  Filled 2022-08-23 (×2): qty 1

## 2022-08-23 MED ORDER — HEPARIN SODIUM (PORCINE) 1000 UNIT/ML IJ SOLN
INTRAMUSCULAR | Status: DC | PRN
  Administered 2022-08-23: 4000 [IU] via INTRAVENOUS

## 2022-08-23 MED ORDER — LIDOCAINE HCL (PF) 1 % IJ SOLN
INTRAMUSCULAR | Status: AC
  Filled 2022-08-23: qty 30

## 2022-08-23 MED ORDER — FUROSEMIDE 10 MG/ML IJ SOLN
40.0000 mg | Freq: Two times a day (BID) | INTRAMUSCULAR | Status: DC
  Administered 2022-08-23 – 2022-08-24 (×3): 40 mg via INTRAVENOUS
  Filled 2022-08-23 (×3): qty 4

## 2022-08-23 MED ORDER — SODIUM CHLORIDE 0.9% FLUSH
3.0000 mL | Freq: Two times a day (BID) | INTRAVENOUS | Status: DC
  Administered 2022-08-23 – 2022-08-24 (×3): 3 mL via INTRAVENOUS

## 2022-08-23 MED ORDER — SODIUM CHLORIDE 0.9 % IV SOLN: 250.0000 mL | INTRAVENOUS | Status: DC | PRN

## 2022-08-23 MED ORDER — HEPARIN (PORCINE) IN NACL 1000-0.9 UT/500ML-% IV SOLN
INTRAVENOUS | Status: DC | PRN
  Administered 2022-08-23 (×2): 500 mL

## 2022-08-23 MED ORDER — APIXABAN 5 MG PO TABS
5.0000 mg | ORAL_TABLET | Freq: Two times a day (BID) | ORAL | Status: DC
  Administered 2022-08-23 – 2022-08-24 (×2): 5 mg via ORAL
  Filled 2022-08-23 (×2): qty 1

## 2022-08-23 MED ORDER — MIDAZOLAM HCL 2 MG/2ML IJ SOLN
INTRAMUSCULAR | Status: DC | PRN
  Administered 2022-08-23 (×2): 1 mg via INTRAVENOUS

## 2022-08-23 MED ORDER — DULOXETINE HCL 60 MG PO CPEP
60.0000 mg | ORAL_CAPSULE | Freq: Every day | ORAL | Status: DC
  Administered 2022-08-23 – 2022-08-24 (×2): 60 mg via ORAL
  Filled 2022-08-23 (×2): qty 1

## 2022-08-23 SURGICAL SUPPLY — 12 items
CATH INFINITI 5 FR IM (CATHETERS) IMPLANT
CATH INFINITI 5FR AL1 (CATHETERS) IMPLANT
CATH INFINITI 5FR MULTPACK ANG (CATHETERS) IMPLANT
DEVICE RAD TR BAND REGULAR (VASCULAR PRODUCTS) IMPLANT
GLIDESHEATH SLEND SS 6F .021 (SHEATH) IMPLANT
GUIDEWIRE INQWIRE 1.5J.035X260 (WIRE) IMPLANT
INQWIRE 1.5J .035X260CM (WIRE) ×1
PACK CARDIAC CATHETERIZATION (CUSTOM PROCEDURE TRAY) ×1 IMPLANT
SET ATX SIMPLICITY (MISCELLANEOUS) IMPLANT
SHEATH PROBE COVER 6X72 (BAG) IMPLANT
TUBING CIL FLEX 10 FLL-RA (TUBING) ×1 IMPLANT
WIRE HI TORQ VERSACORE-J 145CM (WIRE) IMPLANT

## 2022-08-23 NOTE — Progress Notes (Addendum)
Rounding Note    Patient Name: Judy [name] Date of Encounter: 08/23/2022  [place] Cardiologist: Evalina Field, MD   Subjective   No acute overnight events. LHC today showed patent LIMA to LAD and SVG to RCA but occluded SVG to OMs and 1st Diag. LVEDP was severely elevated at 65mHg. Medical therapy was recommended. She is maintaining sinus rhythm and is feeling well this morning with no recurrent chest pain or palpitations. No shortness of breath. She does not look significantly volume overloaded on exam.  Inpatient Medications    Scheduled Meds:  aspirin EC  81 mg Oral Daily   atorvastatin  20 mg Oral QHS   DULoxetine  60 mg Oral Daily   empagliflozin  25 mg Oral QHS   furosemide  40 mg Intravenous Q12H   heparin  5,000 Units Subcutaneous Q8H   insulin aspart  0-15 Units Subcutaneous TID WC   insulin aspart  0-5 Units Subcutaneous QHS   isosorbide mononitrate  30 mg Oral Daily   losartan  25 mg Oral Daily   metoprolol tartrate  25 mg Oral BID   potassium chloride  20 mEq Oral Once   sodium chloride flush  3 mL Intravenous Q12H   sodium chloride flush  3 mL Intravenous Q12H   traZODone  150 mg Oral QHS   Continuous Infusions:  sodium chloride     PRN Meds: sodium chloride, acetaminophen **OR** acetaminophen, hydrALAZINE, ondansetron **OR** ondansetron (ZOFRAN) IV, sodium chloride flush   Vital Signs    Vitals:   08/23/22 0922 08/23/22 0927 08/23/22 0932 08/23/22 0956  BP: (!) 174/76 (!) 174/76  136/83  Pulse: 69 (!) 0 (!) 0 62  Resp: 11   18  Temp:    98.3 F (36.8 C)  TempSrc:    Oral  SpO2: (!) 87% (!) 89% 92% 93%  Weight:      Height:        Intake/Output Summary (Last 24 hours) at 08/23/2022 1001 Last data filed at 08/23/2022 0439 Gross per 24 hour  Intake 233.78 ml  Output 600 ml  Net -366.22 ml      08/23/2022   12:30 AM 08/22/2022    2:51 AM 08/21/2022    6:41 PM  Last 3 Weights  Weight (lbs) 170 lb 176 lb 14.4 oz 175 lb   Weight (kg) 77.111 kg 80.241 kg 79.379 kg      Telemetry    Normal sinus rhythm with rates in the 50s to 60s. - Personally Reviewed  ECG    No new ECG tracing today. - Personally Reviewed  Physical Exam   GEN: Obese Caucasian female resting comfortably in no acute distress.   Neck: No JVD. Cardiac: RRR. No murmurs, rubs, or gallops. TR band in place at left radial cath site. Respiratory: No increased work of breathing. Mild rhonchi noted in bilateral bases but no crackles. GI: Soft, non-distended, and non-tender. MS: No to trace lower extremity edema. No deformity. Skin: Warm and dry. Neuro:  No focal deficits. Psych: Normal affect. Responds appropriately.  Labs    High Sensitivity Troponin:   Recent Labs  Lab 08/21/22 1845 08/21/22 2052 08/22/22 0503 08/22/22 1500  TROPONINIHS 10 94* 1,665* 962*     Chemistry Recent Labs  Lab 08/21/22 1845 08/23/22 0644  NA 141 139  K 3.9 3.4*  CL 100 105  CO2 29 25  GLUCOSE 143* 149*  BUN 14 18  CREATININE 1.18* 1.17*  CALCIUM 10.4* 9.4  MG  --  1.9  PROT  --  6.4*  ALBUMIN  --  3.5  AST  --  20  ALT  --  16  ALKPHOS  --  102  BILITOT  --  0.5  GFRNONAA 47* 47*  ANIONGAP 12 9    Lipids  Recent Labs  Lab 08/22/22 0503  CHOL 111  TRIG 106  HDL 44  LDLCALC 46  CHOLHDL 2.5    Hematology Recent Labs  Lab 08/21/22 1845 08/22/22 0503 08/23/22 0644  WBC 12.3* 9.3 9.0  RBC 5.07 4.38 4.83  HGB 15.3* 13.3 14.8  HCT 46.1* 39.2 43.5  MCV 90.9 89.5 90.1  MCH 30.2 30.4 30.6  MCHC 33.2 33.9 34.0  RDW 13.1 13.1 13.1  PLT 308 238 253   Thyroid No results for input(s): "TSH", "FREET4" in the last 168 hours.  BNP Recent Labs  Lab 08/21/22 1845  BNP 229.9*    DDimer No results for input(s): "DDIMER" in the last 168 hours.   Radiology    CARDIAC CATHETERIZATION  Result Date: 08/23/2022   Mid LM to Dist LM lesion is 40% stenosed.   Ost LAD to Prox LAD lesion is 45% stenosed.   Mid Cx to Dist Cx lesion is  100% stenosed.   Dist RCA lesion is 100% stenosed.   1st Diag-2 lesion is 85% stenosed.   1st Diag-1 lesion is 40% stenosed.   Mid LAD lesion is 100% stenosed.   Mid Cx lesion is 70% stenosed.   RPAV lesion is 100% stenosed.   Origin lesion before 2nd Mrg  is 100% stenosed.   Origin lesion is 100% stenosed.   Dist Graft lesion is 30% stenosed.   LIMA graft was visualized by angiography and is normal in caliber.   Seq SVG- graft was visualized by angiography.   SVG graft was visualized by angiography.   SVG.   The graft exhibits no disease.   LV end diastolic pressure is severely elevated. 3 vessel obstructive CAD. Patent LIMA to the LAD Patent SVG to the RCA Occluded SVG to the OMs Occluded SVG to the first diagonal Severely elevated LVEDP Plan: recommend optimizing antianginal therapy. Patient needs diuresis with very high EDP. If she has refractory angina despite these measures we could consider PCI of the first diagonal. I don't think the distal LCx is suitable for PCI.   ECHOCARDIOGRAM LIMITED  Result Date: 08/22/2022    ECHOCARDIOGRAM REPORT   Patient Name:   Judy [name] [facility] Date of Exam: 08/22/2022 Medical Rec #:  [id number]      Height:       60.0 in Accession #:    [id number]     Weight:       176.9 lb Date of Birth:  1941/10/11      BSA:          1.772 m Patient Age:    81 years       BP:           137/64 mmHg Patient Gender: F              HR:           57 bpm. Exam Location:  Inpatient Procedure: Limited Echo, Cardiac Doppler, Color Doppler and Intracardiac            Opacification Agent Indications:    Atrial Flutter  History:        Patient has prior history of Echocardiogram examinations, most  recent 06/18/2022. Arrythmias:Atrial Flutter; Risk                 Factors:Hypertension, Dyslipidemia and Diabetes.  Sonographer:    Harvie Junior Referring Phys: [phone] ERIC [name]  Sonographer Comments: Patient is obese. Image acquisition challenging due to patient body habitus. IMPRESSIONS  1. Left  ventricular ejection fraction, by estimation, is 50 to 55%. The left ventricle has low normal function. The left ventricle has no regional wall motion abnormalities. Left ventricular diastolic parameters were normal.  2. Right ventricular systolic function is normal. The right ventricular size is normal.  3. The mitral valve is normal in structure. Mild mitral valve regurgitation. No evidence of mitral stenosis.  4. The aortic valve is tricuspid. Aortic valve regurgitation is not visualized. No aortic stenosis is present. Comparison(s): Changes from prior study are noted. Low normal LVEF (50-55%) without focal wall motion abnormalities. Decreased from most recent EF of 60-65%. FINDINGS  Left Ventricle: Left ventricular ejection fraction, by estimation, is 50 to 55%. The left ventricle has low normal function. The left ventricle has no regional wall motion abnormalities. Definity contrast agent was given IV to delineate the left ventricular endocardial borders. The left ventricular internal cavity size was normal in size. There is no left ventricular hypertrophy. Abnormal (paradoxical) septal motion consistent with post-operative status. Left ventricular diastolic parameters were normal. Right Ventricle: The right ventricular size is normal. Right vetricular wall thickness was not well visualized. Right ventricular systolic function is normal. Left Atrium: Left atrial size was not well visualized. Right Atrium: Right atrial size was not well visualized. Pericardium: There is no evidence of pericardial effusion. Mitral Valve: The mitral valve is normal in structure. Mild mitral valve regurgitation. No evidence of mitral valve stenosis. Tricuspid Valve: The tricuspid valve is grossly normal. Tricuspid valve regurgitation is trivial. No evidence of tricuspid stenosis. Aortic Valve: The aortic valve is tricuspid. Aortic valve regurgitation is not visualized. No aortic stenosis is present. Aortic valve mean gradient  measures 2.0 mmHg. Aortic valve peak gradient measures 3.5 mmHg. Aortic valve area, by VTI measures 2.16 cm. Pulmonic Valve: The pulmonic valve was not well visualized. Pulmonic valve regurgitation is not visualized. Aorta: Aortic root could not be assessed. IAS/Shunts: The interatrial septum was not well visualized.  LEFT VENTRICLE PLAX 2D LVIDd:         4.60 cm      Diastology LVIDs:         3.60 cm      LV e' medial:    7.62 cm/s LV PW:         1.00 cm      LV E/e' medial:  10.7 LV IVS:        1.00 cm      LV e' lateral:   8.16 cm/s LVOT diam:     2.00 cm      LV E/e' lateral: 10.0 LV SV:         49 LV SV Index:   27 LVOT Area:     3.14 cm  LV Volumes (MOD) LV vol d, MOD A2C: 89.2 ml LV vol d, MOD A4C: 107.0 ml LV vol s, MOD A2C: 49.8 ml LV vol s, MOD A4C: 62.5 ml LV SV MOD A2C:     39.4 ml LV SV MOD A4C:     107.0 ml LV SV MOD BP:      43.6 ml LEFT ATRIUM         Index LA diam:  3.40 cm 1.92 cm/m  AORTIC VALVE AV Area (Vmax):    2.02 cm AV Area (Vmean):   2.05 cm AV Area (VTI):     2.16 cm AV Vmax:           93.70 cm/s AV Vmean:          64.200 cm/s AV VTI:            0.225 m AV Peak Grad:      3.5 mmHg AV Mean Grad:      2.0 mmHg LVOT Vmax:         60.30 cm/s LVOT Vmean:        41.900 cm/s LVOT VTI:          0.155 m LVOT/AV VTI ratio: 0.69 MITRAL VALVE MV Area (PHT): 4.60 cm    SHUNTS MV Decel Time: 165 msec    Systemic VTI:  0.16 m MR Peak grad: 74.5 mmHg    Systemic Diam: 2.00 cm MR Vmax:      431.50 cm/s MV E velocity: 81.80 cm/s MV A velocity: 81.20 cm/s MV E/A ratio:  1.01 Buford Dresser MD Electronically signed by Buford Dresser MD Signature Date/Time: September 20, 2022/7:17:17 PM    Final    DG Chest Port 1 View  Result Date: 08/21/2022 CLINICAL DATA:  [id number]. ED Triage Notes - Pt from home stated that about an hour ago she started experiencing chest pain radiating into the left shoulder and into left jaw. Pt Pt also states that she is SOB and nauseas. Pt has cardiac and stroke hstory  EXAM: PORTABLE CHEST 1 VIEW COMPARISON:  Chest x-ray 07/12/2021, CT chest 06/20/2020 FINDINGS: Persistent cardiomegaly. The heart and mediastinal contours are unchanged. Atherosclerotic plaque. Surgical changes overlie the mediastinum. Low lung volumes. Retrocardiac airspace opacity. Chronic coarsened markings with no overt pulmonary edema. No pleural effusion. No pneumothorax. No acute osseous abnormality. Interval total reverse left shoulder arthroplasty. Right upper quadrant surgical clips. IMPRESSION: 1. Low lung volumes with developing retrocardiac airspace opacity. Followup PA and lateral chest X-ray is recommended in 3-4 weeks following therapy to ensure resolution and exclude underlying malignancy. 2.  Aortic Atherosclerosis (ICD10-I70.0). Electronically Signed   By: Iven Finn M.D.   On: 08/21/2022 19:35    Cardiac Studies   Limited Echocardiogram 2022-09-20: Impressions: 1. Left ventricular ejection fraction, by estimation, is 50 to 55%. The  left ventricle has low normal function. The left ventricle has no regional  wall motion abnormalities. Left ventricular diastolic parameters were  normal.   2. Right ventricular systolic function is normal. The right ventricular  size is normal.   3. The mitral valve is normal in structure. Mild mitral valve  regurgitation. No evidence of mitral stenosis.   4. The aortic valve is tricuspid. Aortic valve regurgitation is not  visualized. No aortic stenosis is present.   Comparison(s): Changes from prior study are noted. Low normal LVEF  (50-55%) without focal wall motion abnormalities. Decreased from most  recent EF of 60-65%.  _______________  Left Cardiac Catheterization 08/23/2022:   Mid LM to Dist LM lesion is 40% stenosed.   Ost LAD to Prox LAD lesion is 45% stenosed.   Mid Cx to Dist Cx lesion is 100% stenosed.   Dist RCA lesion is 100% stenosed.   1st Diag-2 lesion is 85% stenosed.   1st Diag-1 lesion is 40% stenosed.   Mid LAD  lesion is 100% stenosed.   Mid Cx lesion is 70% stenosed.   RPAV lesion is 100%  stenosed.   Origin lesion before 2nd Mrg  is 100% stenosed.   Origin lesion is 100% stenosed.   Dist Graft lesion is 30% stenosed.   LIMA graft was visualized by angiography and is normal in caliber.   Seq SVG- graft was visualized by angiography.   SVG graft was visualized by angiography.   SVG.   The graft exhibits no disease.   LV end diastolic pressure is severely elevated.   3 vessel obstructive CAD.  Patent LIMA to the LAD Patent SVG to the RCA Occluded SVG to the OMs Occluded SVG to the first diagonal Severely elevated LVEDP   Plan: recommend optimizing antianginal therapy. Patient needs diuresis with very high EDP. If she has refractory angina despite these measures we could consider PCI of the first diagonal. I don't think the distal LCx is suitable for PCI.   Diagnostic Dominance: Right     Patient Profile     81 y.o. female with a history of CAD s/p CABG x5 (LIMA-LAD, SVG-OM2, SVG-OM3, SVG-1st Diag, SVG-PLV) in 03/2020, HFpEF, paroxysmal atrial fibrillation on Eliquis, hypertension, hyperlipdiemia, type 2 diabetes mellitus, CVA (post-cath complication), mild bilateral carotid stenosis, obstructive sleep apnea, and gout who is being seen 08/22/2022 for the evaluation of atrial flutter and NSTEMI at the request of Dr. Starla Link.  Assessment & Plan    NSTEMI CAD History of CAD s/p CABG x5 in 03/2020.  She now presents with chest pain with radiation to left neck, shoulder, and arm. Similar to symptom prior to CABG in 03/2020.  EKG showed atrial fibrillation/flutter, rate 140s bpm, with ST elevation in aVR and diffuse ST depression in inferior lateral leads.  ST changes did improve with improvement in rate but did not completely resolve.  High-sensitivity troponin 10 >> 94 >> 1,665. Echo showed LVEF of 50-55% with no regional wall motion abnormalities. LHC today showed patent LIMA to LAD and SVG to RCA  but occluded SVG to OM2, SVG to OM3, and SVG to 1st Diag. Also showed severely elevated LVEDP of 36 mmHg. Medical therapy was recommended including diuresis. If she has refractory angina despite adjustment of antianginal therapy and diuresis, could consider PCI of the native 1st Diag which has a 85% stenosis. Distal LCX not felt to be suitable for PCI. - No recurrent chest pain. - Continue aspirin, beta-blocker, and high-intensity statin.      Paroxysmal Atrial Flutter with RVR History of Atrial Fibrillation Initially diagnosed in 03/2020 about 1 week after CABG. Presented with chest pain with associated palpitations and was found to be in rapid atrial flutter. Started on IV Cardizem and converted back to sinus rhythm shortly after midnight on 9/20. Echo showed normal LV function. - Maintaining sinus rhythm with rates in the 50s to 60s. - Potassium 3.9 on admission but 3.4 today. Will replete. - Magnesium 1.9 today. - Will check TSH. - Continue Lopressor to '25mg'$  twice daily. - CHA2DS2-VASc = 9 (CAD, CHF, HTN, DM, CVA x2, female, age x2). Initially placed on IV Heparin in anticipation for cath. Can now restart Eliquis now that procedure is done. Will discuss timing of this with MD.   Chronic HFpEF BNP mildly elevated in the 200s. Echo this admission showed LVEF of 50-55%. LHC showed severely elevated LVEDP of 36 mmHg. - Does not appear significantly voluem overloaded. - Has been started on IV Lasix '40mg'$  twice daily. Will replete potassium with this. - Continue Losartan '25mg'$  daily. - Continue Lopressor '25mg'$  twice daily. - Will add Spironolactone 12.'5mg'$  daily  as well for additional diuresis and BP control. - Monitor daily weights, strict I/Os, and renal function.   Hypertension BP mildly elevated. - Continue Losartan '25mg'$  daily and Lopressor '25mg'$  twice daily. - Will add Spironolactone 12.'5mg'$  daily.   Hyperlipidemia Lipid panel this admission: Total Cholesterol 111, Triglycerides 106, HDL  44, LDL 46. - Continue Lipitor '20mg'$  daily.   Type 2 Diabetes Mellitus Hemoglobin A1c 6.7% this admission. - Management per PCP.   CKD Stage IIIa Creatinine 1.18 on admission which seems to be around baseline. - Continue to monitor.  For questions or updates, please contact [place] Please consult [email or web address] for contact info under        Signed, Darreld Mclean, PA-C  08/23/2022, 10:01 AM    I have seen and examined the patient along with Darreld Mclean, PA-C.  I have reviewed the chart, notes and new data.  I agree with PA/NP's note.  Key new complaints: No angina overnight.  Denies dyspnea. Key examination changes: No problems at radial cath site.  No overt findings of hypervolemia on physical exam Key new findings / data: Surprisingly high LVEDP at cath at 36 mmHg.  Angiograms reviewed with Dr. Martinique.  2 of 4 bypass grafts are occluded.  No good option for revascularization left circumflex coronary territory, but may benefit from PCI of native diagonal artery if she remains symptomatic after medical therapy.  PLAN: Started on loop diuretics.  Add spironolactone to compensate for furosemide related potassium loss and to improve blood pressure control and for CHF. Consider adding SGLT2 inhibitor. Resume oral anticoagulant tonight. Reevaluate need for PCI-SVG-diagonal after she is medically optimized.  Sanda Klein, MD, [place] [phone] 08/23/2022, 11:46 AM

## 2022-08-23 NOTE — Interval H&P Note (Signed)
History and Physical Interval Note:  08/23/2022 8:14 AM  Judy [name]  has presented today for surgery, with the diagnosis of nstemi.  The various methods of treatment have been discussed with the patient and family. After consideration of risks, benefits and other options for treatment, the patient has consented to  Procedure(s): LEFT HEART CATH AND CORS/GRAFTS ANGIOGRAPHY (N/A) as a surgical intervention.  The patient's history has been reviewed, patient examined, no change in status, stable for surgery.  I have reviewed the patient's chart and labs.  Questions were answered to the patient's satisfaction.   Cath Lab Visit (complete for each Cath Lab visit)  Clinical Evaluation Leading to the Procedure:   ACS: Yes.    Non-ACS:    Anginal Classification: CCS IV  Anti-ischemic medical therapy: Minimal Therapy (1 class of medications)  Non-Invasive Test Results: No non-invasive testing performed  Prior CABG: Previous CABG        Collier Salina [facility] 08/23/2022 8:14 AM

## 2022-08-23 NOTE — Progress Notes (Addendum)
ANTICOAGULATION CONSULT NOTE - follow-up Pharmacy Consult for heparin Indication: changed to ACS / STEMI. Patient has a history of AF  No Known Allergies  Patient Measurements: Height: 5' (152.4 cm) Weight: 77.1 kg (170 lb) IBW/kg (Calculated) : 45.5 Heparin Dosing Weight: 63.6kg  Vital Signs: Temp: 97.6 F (36.4 C) (09/21 0445) Temp Source: Oral (09/21 0445) BP: 168/69 (09/21 0445) Pulse Rate: 53 (09/21 0445)  Labs: Recent Labs    08/21/22 1845 08/21/22 2052 08/22/22 0503 08/22/22 1500 08/23/22 0644  HGB 15.3*  --  13.3  --  14.8  HCT 46.1*  --  39.2  --  43.5  PLT 308  --  238  --  253  APTT  --   --  50* 67* 73*  HEPARINUNFRC  --   --  >1.10*  --  >1.10*  CREATININE 1.18*  --   --   --   --   TROPONINIHS 10 94* 1,665* 962*  --      Estimated Creatinine Clearance: 34.9 mL/min (A) (by C-G formula based on SCr of 1.18 mg/dL (H)).  Assessment: 81 y.o. female admitted with chest pain, h/o Afib and Elquis on hold, for heparin. Current diagnosis is ACS  aPTT came back in range this AM Heparin level remains >1.10 and under the influence of Eliquis. Will re-check this tomorrow morning  Goal of Therapy:  Heparin level 0.3-0.7 units/ml aPTT 66-102  seconds Monitor platelets by anticoagulation protocol: Yes   Plan:  Continue heparin at 1200 units/hr aPTT confirmatory level at 12:00 Check aPTT/HL in AM  Florencio Hollibaugh BS, PharmD, BCPS Clinical Pharmacist 08/23/2022 8:00 AM  Contact: [phone] after 3 PM  "Be curious, not judgmental..." -Jamal Maes

## 2022-08-23 NOTE — Progress Notes (Signed)
TR band has been removed, site is level zero. Tegaderm applied to site.

## 2022-08-23 NOTE — Progress Notes (Signed)
PROGRESS NOTE    Judy [name]  ERX:[id number] DOB: 1941/03/26 DOA: 08/21/2022 PCP: Michael Boston, MD   Brief Narrative:  81 year old female with history of paroxysmal A-fib on Eliquis, coronary artery disease, diabetes mellitus type 2, hypertension, hyperlipidemia presented with chest pain radiating down her arm with mild shortness of breath.  On presentation, she was tachycardic and found to be in atrial flutter with RVR.  She was started on Cardizem drip and heparin drip.  While waiting to be transferred to [facility], she converted to normal sinus rhythm.  Cardiology was consulted.  Assessment & Plan:   Paroxysmal atrial flutter with rapid ventricular response -Initially started on Cardizem drip but converted to normal sinus rhythm and subsequently Cardizem drip was discontinued.  Currently rate controlled mostly.  Continue metoprolol oral.  Cardiology following.  Continue heparin drip.  Possible non-STEMI/elevated troponin Hyperlipidemia -Echo showed EF of 55 to 60%.  Currently chest pain-free.  Continue aspirin, statin, beta-blocker.  Cardiology planning for cardiac cath today.  Diabetes mellitus type 2 with hyperglycemia -A1c 6.7.  Continue CBGs with SSI.  Continue Jardiance.  Metformin on hold  Hypertension -monitor blood pressure.  Continue losartan and metoprolol  OSA on CPAP -Continue CPAP  obesity -Outpatient follow-up    DVT prophylaxis: Heparin drip Code Status: Full Family Communication: None at bedside Disposition Plan: Status is: Observation The patient will require care spanning > 2 midnights and should be moved to inpatient because: Of need for cardiac catheterization    Consultants: Cardiology  Procedures: 2D echo  Antimicrobials: None   Subjective: Patient seen and examined at bedside.  Denies any current chest pain.  No overnight fever, worsening shortness of breath reported.  Feels slightly anxious.  Objective: Vitals:   08/22/22  2250 08/23/22 0030 08/23/22 0035 08/23/22 0445  BP: 139/73  (!) 156/62 (!) 168/69  Pulse:   (!) 59 (!) 53  Resp: '16  11 13  '$ Temp:   (!) 97.4 F (36.3 C) 97.6 F (36.4 C)  TempSrc:    Oral  SpO2:   93% 95%  Weight:  77.1 kg    Height:        Intake/Output Summary (Last 24 hours) at 08/23/2022 0726 Last data filed at 08/23/2022 0439 Gross per 24 hour  Intake 233.78 ml  Output 600 ml  Net -366.22 ml   Filed Weights   08/21/22 1841 08/22/22 0251 08/23/22 0030  Weight: 79.4 kg 80.2 kg 77.1 kg    Examination:  General exam: Appears calm and comfortable.  Currently on room air. Respiratory system: Bilateral decreased breath sounds at bases with some scattered crackles Cardiovascular system: S1 & S2 heard, mild injury and bradycardia present  gastrointestinal system: Abdomen is nondistended, soft and nontender. Normal bowel sounds heard. Extremities: No cyanosis, clubbing, edema  Central nervous system: Alert and oriented. No focal neurological deficits. Moving extremities Skin: No rashes, lesions or ulcers Psychiatry: Judgement and insight appear normal. Mood & affect appropriate.     Data Reviewed: I have personally reviewed following labs and imaging studies  CBC: Recent Labs  Lab 08/21/22 1845 08/22/22 0503 08/23/22 0644  WBC 12.3* 9.3 9.0  HGB 15.3* 13.3 14.8  HCT 46.1* 39.2 43.5  MCV 90.9 89.5 90.1  PLT 308 238 950   Basic Metabolic Panel: Recent Labs  Lab 08/21/22 1845  NA 141  K 3.9  CL 100  CO2 29  GLUCOSE 143*  BUN 14  CREATININE 1.18*  CALCIUM 10.4*   GFR: Estimated  Creatinine Clearance: 34.9 mL/min (A) (by C-G formula based on SCr of 1.18 mg/dL (H)). Liver Function Tests: No results for input(s): "AST", "ALT", "ALKPHOS", "BILITOT", "PROT", "ALBUMIN" in the last 168 hours. No results for input(s): "LIPASE", "AMYLASE" in the last 168 hours. No results for input(s): "AMMONIA" in the last 168 hours. Coagulation Profile: No results for input(s):  "INR", "PROTIME" in the last 168 hours. Cardiac Enzymes: No results for input(s): "CKTOTAL", "CKMB", "CKMBINDEX", "TROPONINI" in the last 168 hours. BNP (last 3 results) Recent Labs    06/15/22 1433  PROBNP 88   HbA1C: Recent Labs    08/22/22 0503  HGBA1C 6.7*   CBG: Recent Labs  Lab 08/22/22 0603 08/22/22 1258 08/22/22 1636 08/22/22 2141 08/23/22 0652  GLUCAP 112* 127* 168* 228* 140*   Lipid Profile: Recent Labs    08/22/22 0503  CHOL 111  HDL 44  LDLCALC 46  TRIG 106  CHOLHDL 2.5   Thyroid Function Tests: No results for input(s): "TSH", "T4TOTAL", "FREET4", "T3FREE", "THYROIDAB" in the last 72 hours. Anemia Panel: No results for input(s): "VITAMINB12", "FOLATE", "FERRITIN", "TIBC", "IRON", "RETICCTPCT" in the last 72 hours. Sepsis Labs: No results for input(s): "PROCALCITON", "LATICACIDVEN" in the last 168 hours.  Recent Results (from the past 240 hour(s))  SARS Coronavirus 2 by RT PCR (hospital order, performed in [facility] hospital lab) *cepheid single result test* Anterior Nasal Swab     Status: None   Collection Time: 08/21/22  7:36 PM   Specimen: Anterior Nasal Swab  Result Value Ref Range Status   SARS Coronavirus 2 by RT PCR NEGATIVE NEGATIVE Final    Comment: (NOTE) SARS-CoV-2 target nucleic acids are NOT DETECTED.  The SARS-CoV-2 RNA is generally detectable in upper and lower respiratory specimens during the acute phase of infection. The lowest concentration of SARS-CoV-2 viral copies this assay can detect is 250 copies / mL. A negative result does not preclude SARS-CoV-2 infection and should not be used as the sole basis for treatment or other patient management decisions.  A negative result may occur with improper specimen collection / handling, submission of specimen other than nasopharyngeal swab, presence of viral mutation(s) within the areas targeted by this assay, and inadequate number of viral copies (<250 copies / mL). A negative  result must be combined with clinical observations, patient history, and epidemiological information.  Fact Sheet for Patients:   https://www.patel.info/  Fact Sheet for Healthcare Providers: https://hall.com/  This test is not yet approved or  cleared by the [place] FDA and has been authorized for detection and/or diagnosis of SARS-CoV-2 by FDA under an Emergency Use Authorization (EUA).  This EUA will remain in effect (meaning this test can be used) for the duration of the COVID-19 declaration under Section 564(b)(1) of the Act, 21 U.S.C. section 360bbb-3(b)(1), unless the authorization is terminated or revoked sooner.  Performed at [facility], [address], [place],  [address]          Radiology Studies: ECHOCARDIOGRAM LIMITED  Result Date: 08/22/2022    ECHOCARDIOGRAM REPORT   Patient Name:   Judy [name] [facility] Date of Exam: 08/22/2022 Medical Rec #:  [id number]      Height:       60.0 in Accession #:    [id number]     Weight:       176.9 lb Date of Birth:  01/10/1941      BSA:          1.772 m Patient Age:  81 years       BP:           137/64 mmHg Patient Gender: F              HR:           57 bpm. Exam Location:  Inpatient Procedure: Limited Echo, Cardiac Doppler, Color Doppler and Intracardiac            Opacification Agent Indications:    Atrial Flutter  History:        Patient has prior history of Echocardiogram examinations, most                 recent 06/18/2022. Arrythmias:Atrial Flutter; Risk                 Factors:Hypertension, Dyslipidemia and Diabetes.  Sonographer:    Harvie Junior Referring Phys: [phone] ERIC [name]  Sonographer Comments: Patient is obese. Image acquisition challenging due to patient body habitus. IMPRESSIONS  1. Left ventricular ejection fraction, by estimation, is 50 to 55%. The left ventricle has low normal function. The left ventricle has no regional wall motion abnormalities.  Left ventricular diastolic parameters were normal.  2. Right ventricular systolic function is normal. The right ventricular size is normal.  3. The mitral valve is normal in structure. Mild mitral valve regurgitation. No evidence of mitral stenosis.  4. The aortic valve is tricuspid. Aortic valve regurgitation is not visualized. No aortic stenosis is present. Comparison(s): Changes from prior study are noted. Low normal LVEF (50-55%) without focal wall motion abnormalities. Decreased from most recent EF of 60-65%. FINDINGS  Left Ventricle: Left ventricular ejection fraction, by estimation, is 50 to 55%. The left ventricle has low normal function. The left ventricle has no regional wall motion abnormalities. Definity contrast agent was given IV to delineate the left ventricular endocardial borders. The left ventricular internal cavity size was normal in size. There is no left ventricular hypertrophy. Abnormal (paradoxical) septal motion consistent with post-operative status. Left ventricular diastolic parameters were normal. Right Ventricle: The right ventricular size is normal. Right vetricular wall thickness was not well visualized. Right ventricular systolic function is normal. Left Atrium: Left atrial size was not well visualized. Right Atrium: Right atrial size was not well visualized. Pericardium: There is no evidence of pericardial effusion. Mitral Valve: The mitral valve is normal in structure. Mild mitral valve regurgitation. No evidence of mitral valve stenosis. Tricuspid Valve: The tricuspid valve is grossly normal. Tricuspid valve regurgitation is trivial. No evidence of tricuspid stenosis. Aortic Valve: The aortic valve is tricuspid. Aortic valve regurgitation is not visualized. No aortic stenosis is present. Aortic valve mean gradient measures 2.0 mmHg. Aortic valve peak gradient measures 3.5 mmHg. Aortic valve area, by VTI measures 2.16 cm. Pulmonic Valve: The pulmonic valve was not well visualized.  Pulmonic valve regurgitation is not visualized. Aorta: Aortic root could not be assessed. IAS/Shunts: The interatrial septum was not well visualized.  LEFT VENTRICLE PLAX 2D LVIDd:         4.60 cm      Diastology LVIDs:         3.60 cm      LV e' medial:    7.62 cm/s LV PW:         1.00 cm      LV E/e' medial:  10.7 LV IVS:        1.00 cm      LV e' lateral:   8.16 cm/s LVOT diam:  2.00 cm      LV E/e' lateral: 10.0 LV SV:         49 LV SV Index:   27 LVOT Area:     3.14 cm  LV Volumes (MOD) LV vol d, MOD A2C: 89.2 ml LV vol d, MOD A4C: 107.0 ml LV vol s, MOD A2C: 49.8 ml LV vol s, MOD A4C: 62.5 ml LV SV MOD A2C:     39.4 ml LV SV MOD A4C:     107.0 ml LV SV MOD BP:      43.6 ml LEFT ATRIUM         Index LA diam:    3.40 cm 1.92 cm/m  AORTIC VALVE AV Area (Vmax):    2.02 cm AV Area (Vmean):   2.05 cm AV Area (VTI):     2.16 cm AV Vmax:           93.70 cm/s AV Vmean:          64.200 cm/s AV VTI:            0.225 m AV Peak Grad:      3.5 mmHg AV Mean Grad:      2.0 mmHg LVOT Vmax:         60.30 cm/s LVOT Vmean:        41.900 cm/s LVOT VTI:          0.155 m LVOT/AV VTI ratio: 0.69 MITRAL VALVE MV Area (PHT): 4.60 cm    SHUNTS MV Decel Time: 165 msec    Systemic VTI:  0.16 m MR Peak grad: 74.5 mmHg    Systemic Diam: 2.00 cm MR Vmax:      431.50 cm/s MV E velocity: 81.80 cm/s MV A velocity: 81.20 cm/s MV E/A ratio:  1.01 Buford Dresser MD Electronically signed by Buford Dresser MD Signature Date/Time: 08/22/2022/7:17:17 PM    Final    DG Chest Port 1 View  Result Date: 08/21/2022 CLINICAL DATA:  [id number]. ED Triage Notes - Pt from home stated that about an hour ago she started experiencing chest pain radiating into the left shoulder and into left jaw. Pt Pt also states that she is SOB and nauseas. Pt has cardiac and stroke hstory EXAM: PORTABLE CHEST 1 VIEW COMPARISON:  Chest x-ray 07/12/2021, CT chest 06/20/2020 FINDINGS: Persistent cardiomegaly. The heart and mediastinal contours are unchanged.  Atherosclerotic plaque. Surgical changes overlie the mediastinum. Low lung volumes. Retrocardiac airspace opacity. Chronic coarsened markings with no overt pulmonary edema. No pleural effusion. No pneumothorax. No acute osseous abnormality. Interval total reverse left shoulder arthroplasty. Right upper quadrant surgical clips. IMPRESSION: 1. Low lung volumes with developing retrocardiac airspace opacity. Followup PA and lateral chest X-ray is recommended in 3-4 weeks following therapy to ensure resolution and exclude underlying malignancy. 2.  Aortic Atherosclerosis (ICD10-I70.0). Electronically Signed   By: Iven Finn M.D.   On: 08/21/2022 19:35        Scheduled Meds:  aspirin EC  81 mg Oral Daily   atorvastatin  20 mg Oral QHS   DULoxetine  60 mg Oral Daily   empagliflozin  25 mg Oral QHS   insulin aspart  0-15 Units Subcutaneous TID WC   insulin aspart  0-5 Units Subcutaneous QHS   losartan  25 mg Oral Daily   metoprolol tartrate  25 mg Oral BID   sodium chloride flush  3 mL Intravenous Q12H   traZODone  150 mg Oral QHS   Continuous Infusions:  sodium chloride  sodium chloride 10 mL/hr at 08/23/22 0439   heparin 1,200 Units/hr (08/22/22 2250)          Aline August, MD [facility] 08/23/2022, 7:26 AM

## 2022-08-23 NOTE — Progress Notes (Signed)
RT NOTE:  Pt has home CPAP setup at bedside. Pt will manage. Hospital CPAP removed from room.

## 2022-08-24 ENCOUNTER — Other Ambulatory Visit (HOSPITAL_COMMUNITY): Payer: Self-pay

## 2022-08-24 DIAGNOSIS — R778 Other specified abnormalities of plasma proteins: Secondary | ICD-10-CM | POA: Diagnosis not present

## 2022-08-24 DIAGNOSIS — I214 Non-ST elevation (NSTEMI) myocardial infarction: Secondary | ICD-10-CM | POA: Diagnosis not present

## 2022-08-24 DIAGNOSIS — G4733 Obstructive sleep apnea (adult) (pediatric): Secondary | ICD-10-CM | POA: Diagnosis not present

## 2022-08-24 DIAGNOSIS — I4892 Unspecified atrial flutter: Secondary | ICD-10-CM | POA: Diagnosis not present

## 2022-08-24 LAB — GLUCOSE, CAPILLARY
Glucose-Capillary: 122 mg/dL — ABNORMAL HIGH (ref 70–99)
Glucose-Capillary: 257 mg/dL — ABNORMAL HIGH (ref 70–99)

## 2022-08-24 LAB — BASIC METABOLIC PANEL
Anion gap: 8 (ref 5–15)
BUN: 21 mg/dL (ref 8–23)
CO2: 25 mmol/L (ref 22–32)
Calcium: 9.4 mg/dL (ref 8.9–10.3)
Chloride: 106 mmol/L (ref 98–111)
Creatinine, Ser: 1.47 mg/dL — ABNORMAL HIGH (ref 0.44–1.00)
GFR, Estimated: 36 mL/min — ABNORMAL LOW (ref >60)
Glucose, Bld: 131 mg/dL — ABNORMAL HIGH (ref 70–99)
Potassium: 4.9 mmol/L (ref 3.5–5.1)
Sodium: 139 mmol/L (ref 135–145)

## 2022-08-24 LAB — CBC
HCT: 44.8 % (ref 36.0–46.0)
Hemoglobin: 14.5 g/dL (ref 12.0–15.0)
MCH: 29.9 pg (ref 26.0–34.0)
MCHC: 32.4 g/dL (ref 30.0–36.0)
MCV: 92.4 fL (ref 80.0–100.0)
Platelets: 248 10*3/uL (ref 150–400)
RBC: 4.85 MIL/uL (ref 3.87–5.11)
RDW: 13.4 % (ref 11.5–15.5)
WBC: 9 10*3/uL (ref 4.0–10.5)
nRBC: 0 % (ref 0.0–0.2)

## 2022-08-24 LAB — MAGNESIUM: Magnesium: 2 mg/dL (ref 1.7–2.4)

## 2022-08-24 MED ORDER — SPIRONOLACTONE 25 MG PO TABS: 12.5000 mg | ORAL_TABLET | Freq: Every day | ORAL | 0 refills | Status: DC

## 2022-08-24 MED ORDER — FUROSEMIDE 40 MG PO TABS: 40.0000 mg | ORAL_TABLET | Freq: Every day | ORAL | 0 refills | Status: DC

## 2022-08-24 MED ORDER — ISOSORBIDE MONONITRATE ER 30 MG PO TB24: 30.0000 mg | ORAL_TABLET | Freq: Every day | ORAL | 0 refills | Status: DC

## 2022-08-24 MED ORDER — METOPROLOL TARTRATE 25 MG PO TABS: 25.0000 mg | ORAL_TABLET | Freq: Two times a day (BID) | ORAL | 0 refills | Status: DC

## 2022-08-24 NOTE — Discharge Instructions (Signed)

## 2022-08-24 NOTE — TOC Transition Note (Addendum)
Transition of Care [facility]) - CM/SW Discharge Note   Patient Details  Name: Judy [name] MRN: [id number] Date of Birth: 07-27-1941  [facility] CM/SW Contact:  Zenon Mayo, RN Phone Number: 08/24/2022, 10:32 AM   Clinical Narrative:    Patient is for dc , pharmacy will do the benefit check for farxiga and jardiance.  copay for Wilder Glade is $151.18. copay for Vania Rea is $158.66. patient is in the donut hole currently with insurance coverage per benefit check.          Patient Goals and CMS Choice        Discharge Placement                       Discharge Plan and Services                                     Social Determinants of Health (SDOH) Interventions     Readmission Risk Interventions    03/08/2020   10:09 AM 12/10/2019    3:23 PM  Readmission Risk Prevention Plan  Post Dischage Appt  Complete  Medication Screening  Complete  Transportation Screening Complete Complete  PCP or Specialist Appt within 5-7 Days Complete   Home Care Screening Complete   Medication Review (RN CM) Complete

## 2022-08-24 NOTE — Progress Notes (Signed)
Rounding Note    Patient Name: Judy [name] Date of Encounter: 08/24/2022  [place] Cardiologist: Evalina Field, MD   Subjective   Feeling better.  Had a good nights rest and lying flat in bed. Good urine output.  Net diuresis 850 mL yesterday evening.  Inpatient Medications    Scheduled Meds:  apixaban  5 mg Oral BID   aspirin EC  81 mg Oral Daily   atorvastatin  20 mg Oral QHS   DULoxetine  60 mg Oral Daily   empagliflozin  25 mg Oral QHS   furosemide  40 mg Intravenous Q12H   insulin aspart  0-15 Units Subcutaneous TID WC   insulin aspart  0-5 Units Subcutaneous QHS   isosorbide mononitrate  30 mg Oral Daily   losartan  25 mg Oral Daily   metoprolol tartrate  25 mg Oral BID   sodium chloride flush  3 mL Intravenous Q12H   sodium chloride flush  3 mL Intravenous Q12H   spironolactone  12.5 mg Oral Daily   traZODone  150 mg Oral QHS   Continuous Infusions:  sodium chloride     PRN Meds: sodium chloride, acetaminophen **OR** acetaminophen, ondansetron **OR** ondansetron (ZOFRAN) IV, sodium chloride flush   Vital Signs    Vitals:   08/23/22 2000 08/24/22 0000 08/24/22 0530 08/24/22 0927  BP: 124/64 127/65  (!) 163/79  Pulse: (!) 53 (!) 55 (!) 55 89  Resp: '13 17 15 18  '$ Temp: 98.3 F (36.8 C) 98 F (36.7 C) 98 F (36.7 C) 97.6 F (36.4 C)  TempSrc:   Oral Oral  SpO2: 94% 90% 99% 92%  Weight:      Height:        Intake/Output Summary (Last 24 hours) at 08/24/2022 0927 Last data filed at 08/24/2022 0000 Gross per 24 hour  Intake 340 ml  Output 1700 ml  Net -1360 ml      08/23/2022   12:30 AM 08/22/2022    2:51 AM 08/21/2022    6:41 PM  Last 3 Weights  Weight (lbs) 170 lb 176 lb 14.4 oz 175 lb  Weight (kg) 77.111 kg 80.241 kg 79.379 kg      Telemetry    Moderate sinus bradycardia/normal sinus rhythm- Personally Reviewed  ECG    No new tracing- Personally Reviewed  Physical Exam   GEN: No acute distress.   Neck: No  JVD Cardiac: RRR, no murmurs, rubs, or gallops.  No problems at left radial access site. Respiratory: Clear to auscultation bilaterally. GI: Soft, nontender, non-distended  MS: No edema; No deformity. Neuro:  Nonfocal  Psych: Normal affect   Labs    High Sensitivity Troponin:   Recent Labs  Lab 08/21/22 1845 08/21/22 2052 08/22/22 0503 08/22/22 1500  TROPONINIHS 10 94* 1,665* 962*     Chemistry Recent Labs  Lab 08/21/22 1845 08/23/22 0644 08/24/22 0444  NA 141 139 139  K 3.9 3.4* 4.9  CL 100 105 106  CO2 '29 25 25  '$ GLUCOSE 143* 149* 131*  BUN '14 18 21  '$ CREATININE 1.18* 1.17* 1.47*  CALCIUM 10.4* 9.4 9.4  MG  --  1.9 2.0  PROT  --  6.4*  --   ALBUMIN  --  3.5  --   AST  --  20  --   ALT  --  16  --   ALKPHOS  --  102  --   BILITOT  --  0.5  --  GFRNONAA 47* 47* 36*  ANIONGAP '12 9 8    '$ Lipids  Recent Labs  Lab 08/22/22 0503  CHOL 111  TRIG 106  HDL 44  LDLCALC 46  CHOLHDL 2.5    Hematology Recent Labs  Lab 08/22/22 0503 08/23/22 0644 08/24/22 0444  WBC 9.3 9.0 9.0  RBC 4.38 4.83 4.85  HGB 13.3 14.8 14.5  HCT 39.2 43.5 44.8  MCV 89.5 90.1 92.4  MCH 30.4 30.6 29.9  MCHC 33.9 34.0 32.4  RDW 13.1 13.1 13.4  PLT 238 253 248   Thyroid  Recent Labs  Lab 08/23/22 0644  TSH 3.495    BNP Recent Labs  Lab 08/21/22 1845  BNP 229.9*    DDimer No results for input(s): "DDIMER" in the last 168 hours.   Radiology    CARDIAC CATHETERIZATION  Result Date: 08/23/2022   Mid LM to Dist LM lesion is 40% stenosed.   Ost LAD to Prox LAD lesion is 45% stenosed.   Mid Cx to Dist Cx lesion is 100% stenosed.   Dist RCA lesion is 100% stenosed.   1st Diag-2 lesion is 85% stenosed.   1st Diag-1 lesion is 40% stenosed.   Mid LAD lesion is 100% stenosed.   Mid Cx lesion is 70% stenosed.   RPAV lesion is 100% stenosed.   Origin lesion before 2nd Mrg  is 100% stenosed.   Origin lesion is 100% stenosed.   Dist Graft lesion is 30% stenosed.   LIMA graft was visualized  by angiography and is normal in caliber.   Seq SVG- graft was visualized by angiography.   SVG graft was visualized by angiography.   SVG.   The graft exhibits no disease.   LV end diastolic pressure is severely elevated. 3 vessel obstructive CAD. Patent LIMA to the LAD Patent SVG to the RCA Occluded SVG to the OMs Occluded SVG to the first diagonal Severely elevated LVEDP Plan: recommend optimizing antianginal therapy. Patient needs diuresis with very high EDP. If she has refractory angina despite these measures we could consider PCI of the first diagonal. I don't think the distal LCx is suitable for PCI.   ECHOCARDIOGRAM LIMITED  Result Date: 08/22/2022    ECHOCARDIOGRAM REPORT   Patient Name:   Judy [name] [facility] Date of Exam: 08/22/2022 Medical Rec #:  [id number]      Height:       60.0 in Accession #:    [id number]     Weight:       176.9 lb Date of Birth:  Oct 05, 1941      BSA:          1.772 m Patient Age:    81 years       BP:           137/64 mmHg Patient Gender: F              HR:           57 bpm. Exam Location:  Inpatient Procedure: Limited Echo, Cardiac Doppler, Color Doppler and Intracardiac            Opacification Agent Indications:    Atrial Flutter  History:        Patient has prior history of Echocardiogram examinations, most                 recent 06/18/2022. Arrythmias:Atrial Flutter; Risk                 Factors:Hypertension, Dyslipidemia and Diabetes.  Sonographer:    Harvie Junior Referring Phys: [phone] ERIC [name]  Sonographer Comments: Patient is obese. Image acquisition challenging due to patient body habitus. IMPRESSIONS  1. Left ventricular ejection fraction, by estimation, is 50 to 55%. The left ventricle has low normal function. The left ventricle has no regional wall motion abnormalities. Left ventricular diastolic parameters were normal.  2. Right ventricular systolic function is normal. The right ventricular size is normal.  3. The mitral valve is normal in structure. Mild mitral valve  regurgitation. No evidence of mitral stenosis.  4. The aortic valve is tricuspid. Aortic valve regurgitation is not visualized. No aortic stenosis is present. Comparison(s): Changes from prior study are noted. Low normal LVEF (50-55%) without focal wall motion abnormalities. Decreased from most recent EF of 60-65%. FINDINGS  Left Ventricle: Left ventricular ejection fraction, by estimation, is 50 to 55%. The left ventricle has low normal function. The left ventricle has no regional wall motion abnormalities. Definity contrast agent was given IV to delineate the left ventricular endocardial borders. The left ventricular internal cavity size was normal in size. There is no left ventricular hypertrophy. Abnormal (paradoxical) septal motion consistent with post-operative status. Left ventricular diastolic parameters were normal. Right Ventricle: The right ventricular size is normal. Right vetricular wall thickness was not well visualized. Right ventricular systolic function is normal. Left Atrium: Left atrial size was not well visualized. Right Atrium: Right atrial size was not well visualized. Pericardium: There is no evidence of pericardial effusion. Mitral Valve: The mitral valve is normal in structure. Mild mitral valve regurgitation. No evidence of mitral valve stenosis. Tricuspid Valve: The tricuspid valve is grossly normal. Tricuspid valve regurgitation is trivial. No evidence of tricuspid stenosis. Aortic Valve: The aortic valve is tricuspid. Aortic valve regurgitation is not visualized. No aortic stenosis is present. Aortic valve mean gradient measures 2.0 mmHg. Aortic valve peak gradient measures 3.5 mmHg. Aortic valve area, by VTI measures 2.16 cm. Pulmonic Valve: The pulmonic valve was not well visualized. Pulmonic valve regurgitation is not visualized. Aorta: Aortic root could not be assessed. IAS/Shunts: The interatrial septum was not well visualized.  LEFT VENTRICLE PLAX 2D LVIDd:         4.60 cm       Diastology LVIDs:         3.60 cm      LV e' medial:    7.62 cm/s LV PW:         1.00 cm      LV E/e' medial:  10.7 LV IVS:        1.00 cm      LV e' lateral:   8.16 cm/s LVOT diam:     2.00 cm      LV E/e' lateral: 10.0 LV SV:         49 LV SV Index:   27 LVOT Area:     3.14 cm  LV Volumes (MOD) LV vol d, MOD A2C: 89.2 ml LV vol d, MOD A4C: 107.0 ml LV vol s, MOD A2C: 49.8 ml LV vol s, MOD A4C: 62.5 ml LV SV MOD A2C:     39.4 ml LV SV MOD A4C:     107.0 ml LV SV MOD BP:      43.6 ml LEFT ATRIUM         Index LA diam:    3.40 cm 1.92 cm/m  AORTIC VALVE AV Area (Vmax):    2.02 cm AV Area (Vmean):   2.05 cm AV Area (VTI):  2.16 cm AV Vmax:           93.70 cm/s AV Vmean:          64.200 cm/s AV VTI:            0.225 m AV Peak Grad:      3.5 mmHg AV Mean Grad:      2.0 mmHg LVOT Vmax:         60.30 cm/s LVOT Vmean:        41.900 cm/s LVOT VTI:          0.155 m LVOT/AV VTI ratio: 0.69 MITRAL VALVE MV Area (PHT): 4.60 cm    SHUNTS MV Decel Time: 165 msec    Systemic VTI:  0.16 m MR Peak grad: 74.5 mmHg    Systemic Diam: 2.00 cm MR Vmax:      431.50 cm/s MV E velocity: 81.80 cm/s MV A velocity: 81.20 cm/s MV E/A ratio:  1.01 Buford Dresser MD Electronically signed by Buford Dresser MD Signature Date/Time: 08/22/2022/7:17:17 PM    Final     Cardiac Studies   08/23/2022 cardiac catheterization    Mid LM to Dist LM lesion is 40% stenosed.   Ost LAD to Prox LAD lesion is 45% stenosed.   Mid Cx to Dist Cx lesion is 100% stenosed.   Dist RCA lesion is 100% stenosed.   1st Diag-2 lesion is 85% stenosed.   1st Diag-1 lesion is 40% stenosed.   Mid LAD lesion is 100% stenosed.   Mid Cx lesion is 70% stenosed.   RPAV lesion is 100% stenosed.   Origin lesion before 2nd Mrg  is 100% stenosed.   Origin lesion is 100% stenosed.   Dist Graft lesion is 30% stenosed.   LIMA graft was visualized by angiography and is normal in caliber.   Seq SVG- graft was visualized by angiography.   SVG graft was  visualized by angiography.   SVG.   The graft exhibits no disease.   LV end diastolic pressure is severely elevated.   3 vessel obstructive CAD.  Patent LIMA to the LAD Patent SVG to the RCA Occluded SVG to the OMs Occluded SVG to the first diagonal Severely elevated LVEDP   Plan: recommend optimizing antianginal therapy. Patient needs diuresis with very high EDP. If she has refractory angina despite these measures we could consider PCI of the first diagonal. I don't think the distal LCx is suitable for PCI.    Diagnostic Dominance: Right    Limited Echocardiogram 08/22/2022: Impressions: 1. Left ventricular ejection fraction, by estimation, is 50 to 55%. The  left ventricle has low normal function. The left ventricle has no regional  wall motion abnormalities. Left ventricular diastolic parameters were  normal.   2. Right ventricular systolic function is normal. The right ventricular  size is normal.   3. The mitral valve is normal in structure. Mild mitral valve  regurgitation. No evidence of mitral stenosis.   4. The aortic valve is tricuspid. Aortic valve regurgitation is not  visualized. No aortic stenosis is present.   Comparison(s): Changes from prior study are noted. Low normal LVEF  (50-55%) without focal wall motion abnormalities. Decreased from most  recent EF of 60-65%.  _______________ Patient Profile     81 y.o. female presenting with NSTEMI, roughly 2 years after CABG, found to have interval occlusion of 2 of the SVG bypasses (to diagonal and OM 3), patent LIMA to LAD and SVG to RCA, with evidence of acute exacerbation  of chronic diastolic heart failure (LVEDP 36) in the setting of atrial flutter with RVR.  Additional medical problems include DM type II, hyperlipidemia, HTN, bilateral carotid stenosis, OSA, gout  Assessment & Plan    Maintaining sinus rhythm Feeling much better, slept well last night lying fully supine. Denies angina. Discussed angiographic  findings.  We will try to adjust diuretics and antianginals for symptom relief.  If this is not successful, will bring back for PCI to diagonal artery. If she does well walking in the hallway today, I think she can be discharged with outpatient follow-up.  Hanna will sign off.   Medication Recommendations:   Aspirin 81 mg daily Furosemide (increased to) 40 mg daily Jardiance 25 mg daily Eliquis 5 mg twice daily Losartan 25 mg daily Atorvastatin 20 mg daily Metoprolol tartrate increased to 25 mg twice daily Isosorbide mononitrate 30 mg daily (new) Spironolactone 25 mg daily (new) Other recommendations (labs, testing, etc): Outpatient basic metabolic panel in about 2 weeks Follow up as an outpatient: office visit in next 2-3 weeks (already has follow-up scheduled for October 19 with Dr. Audie Box).  For questions or updates, please contact [place] Please consult [email or web address] for contact info under        Signed, Sanda Klein, MD  08/24/2022, 9:27 AM

## 2022-08-24 NOTE — Discharge Summary (Signed)
Physician Discharge Summary  Judy [name] FAO:[id number] DOB: Apr 19, 1941 DOA: 08/21/2022  PCP: Michael Boston, MD  Admit date: 08/21/2022 Discharge date: 08/24/2022  Admitted From: Home Disposition: Home  Recommendations for Outpatient Follow-up:  Follow up with PCP in 1 week with repeat CBC/BMP Outpatient follow-up with cardiology Follow up in ED if symptoms worsen or new appear   Home Health: No Equipment/Devices: None  Discharge Condition: Stable CODE STATUS: Full Diet recommendation: Heart healthy  Brief/Interim Summary: 81 year old female with history of paroxysmal A-fib on Eliquis, coronary artery disease, diabetes mellitus type 2, hypertension, hyperlipidemia presented with chest pain radiating down her arm with mild shortness of breath.  On presentation, she was tachycardic and found to be in atrial flutter with RVR.  She was started on Cardizem drip and heparin drip.  While waiting to be transferred to [facility], she converted to normal sinus rhythm.  Cardiology was consulted.  She underwent cardiac catheterization on 08/23/2022.  Subsequently, cardiology has adjusted her medications.  Currently chest pain-free and cardiology has cleared her for discharge.  Discharge patient home today.  Discharge Diagnoses:  Paroxysmal atrial flutter with rapid ventricular response -Initially started on Cardizem drip but converted to normal sinus rhythm and subsequently Cardizem drip was discontinued.  Currently rate controlled mostly.  Cardiology following and cleared her for discharge.  Continue Eliquis on discharge.  Metoprolol dose has been increased to 25 mg twice a day.  Outpatient follow-up with cardiology.   Possible non-STEMI/elevated troponin Hyperlipidemia -Echo showed EF of 55 to 60%.  Currently chest pain-free.  Continue aspirin, statin, beta-blocker.  Isosorbide mononitrate, spironolactone have been started by cardiology which will be continued on discharge. -Status post  cardiac catheterization on 08/23/2022: Cardiology recommended medical management.   -Outpatient follow-up with cardiology.  Diabetes mellitus type 2 with hyperglycemia -A1c 6.7.  Carb modified diet.  Continue home regimen.  Continue Jardiance  Hypertension -On discharge, patient will be on metoprolol, losartan, spironolactone, Imdur and Lasix.  Dose of metoprolol and Lasix have been increased during this hospitalization.  Outpatient follow-up with PCP/cardiology   OSA on CPAP -Continue CPAP   obesity -Outpatient follow-up   Discharge Instructions  Discharge Instructions     Diet - low sodium heart healthy   Complete by: As directed    Diet Carb Modified   Complete by: As directed    Increase activity slowly   Complete by: As directed       Allergies as of 08/24/2022   No Known Allergies      Medication List     STOP taking these medications    glimepiride 1 MG tablet Commonly known as: AMARYL   oxyCODONE-acetaminophen 5-325 MG tablet Commonly known as: Percocet       TAKE these medications    Accu-Chek Softclix Lancets lancets USE TO CHECK BLOOD SUGAR TWICE DAILY   aspirin EC 81 MG tablet Take 1 tablet (81 mg total) by mouth daily. Swallow whole.   atorvastatin 20 MG tablet Commonly known as: LIPITOR Take 1 tablet (20 mg total) by mouth at bedtime.   B-12 Compliance Injection 1000 MCG/ML Kit Generic drug: Cyanocobalamin Inject 1,000 mcg into the muscle every 30 (thirty) days.   DULoxetine 60 MG capsule Commonly known as: CYMBALTA Take 60 mg by mouth daily.   Eliquis 5 MG Tabs tablet Generic drug: apixaban TAKE ONE TABLET BY MOUTH TWICE DAILY   furosemide 40 MG tablet Commonly known as: Lasix Take 1 tablet (40 mg total) by mouth daily.  What changed:  medication strength how much to take   glucose blood test strip Commonly known as: Accu-Chek Aviva Test twice daily   isosorbide mononitrate 30 MG 24 hr tablet Commonly known as:  IMDUR Take 1 tablet (30 mg total) by mouth daily. Start taking on: August 25, 2022   Jardiance 25 MG Tabs tablet Generic drug: empagliflozin Take 25 mg by mouth at bedtime.   losartan 25 MG tablet Commonly known as: COZAAR Take 1 tablet (25 mg total) by mouth daily.   metFORMIN 750 MG 24 hr tablet Commonly known as: GLUCOPHAGE-XR Take 750 mg by mouth daily with breakfast.   metoprolol tartrate 25 MG tablet Commonly known as: LOPRESSOR Take 1 tablet (25 mg total) by mouth 2 (two) times daily. What changed:  how much to take how to take this when to take this additional instructions   NON FORMULARY 1 each by Other route See admin instructions. CPAP machine nightly   ondansetron 4 MG tablet Commonly known as: Zofran Take 1 tablet (4 mg total) by mouth every 8 (eight) hours as needed for nausea or vomiting.   spironolactone 25 MG tablet Commonly known as: ALDACTONE Take 0.5 tablets (12.5 mg total) by mouth daily. Start taking on: August 25, 2022   tiZANidine 4 MG tablet Commonly known as: Zanaflex Take 1 tablet (4 mg total) by mouth every 8 (eight) hours as needed for muscle spasms.   traMADol 50 MG tablet Commonly known as: ULTRAM Take 50-100 mg by mouth every 8 (eight) hours as needed for moderate pain.   traZODone 100 MG tablet Commonly known as: DESYREL Take 150 mg by mouth at bedtime.        Follow-up Information     Michael Boston, MD. Schedule an appointment as soon as possible for a visit in 1 week(s).   Specialty: Internal Medicine Contact information: [place] [address] [phone]         O'Neal, Cassie Freer, MD .   Specialties: Cardiology, Internal Medicine, Radiology Contact information: [place] [address] [phone]                No Known Allergies  Consultations: Cardiology   Procedures/Studies: CARDIAC CATHETERIZATION  Result Date: 08/23/2022   Mid LM to Dist LM lesion  is 40% stenosed.   Ost LAD to Prox LAD lesion is 45% stenosed.   Mid Cx to Dist Cx lesion is 100% stenosed.   Dist RCA lesion is 100% stenosed.   1st Diag-2 lesion is 85% stenosed.   1st Diag-1 lesion is 40% stenosed.   Mid LAD lesion is 100% stenosed.   Mid Cx lesion is 70% stenosed.   RPAV lesion is 100% stenosed.   Origin lesion before 2nd Mrg  is 100% stenosed.   Origin lesion is 100% stenosed.   Dist Graft lesion is 30% stenosed.   LIMA graft was visualized by angiography and is normal in caliber.   Seq SVG- graft was visualized by angiography.   SVG graft was visualized by angiography.   SVG.   The graft exhibits no disease.   LV end diastolic pressure is severely elevated. 3 vessel obstructive CAD. Patent LIMA to the LAD Patent SVG to the RCA Occluded SVG to the OMs Occluded SVG to the first diagonal Severely elevated LVEDP Plan: recommend optimizing antianginal therapy. Patient needs diuresis with very high EDP. If she has refractory angina despite these measures we could consider PCI of the first diagonal. I  don't think the distal LCx is suitable for PCI.   ECHOCARDIOGRAM LIMITED  Result Date: 08/22/2022    ECHOCARDIOGRAM REPORT   Patient Name:   Judy [name] [facility] Date of Exam: 08/22/2022 Medical Rec #:  [id number]      Height:       60.0 in Accession #:    [id number]     Weight:       176.9 lb Date of Birth:  01-24-1941      BSA:          1.772 m Patient Age:    26 years       BP:           137/64 mmHg Patient Gender: F              HR:           57 bpm. Exam Location:  Inpatient Procedure: Limited Echo, Cardiac Doppler, Color Doppler and Intracardiac            Opacification Agent Indications:    Atrial Flutter  History:        Patient has prior history of Echocardiogram examinations, most                 recent 06/18/2022. Arrythmias:Atrial Flutter; Risk                 Factors:Hypertension, Dyslipidemia and Diabetes.  Sonographer:    Harvie Junior Referring Phys: [id number] ERIC [name]  Sonographer Comments:  Patient is obese. Image acquisition challenging due to patient body habitus. IMPRESSIONS  1. Left ventricular ejection fraction, by estimation, is 50 to 55%. The left ventricle has low normal function. The left ventricle has no regional wall motion abnormalities. Left ventricular diastolic parameters were normal.  2. Right ventricular systolic function is normal. The right ventricular size is normal.  3. The mitral valve is normal in structure. Mild mitral valve regurgitation. No evidence of mitral stenosis.  4. The aortic valve is tricuspid. Aortic valve regurgitation is not visualized. No aortic stenosis is present. Comparison(s): Changes from prior study are noted. Low normal LVEF (50-55%) without focal wall motion abnormalities. Decreased from most recent EF of 60-65%. FINDINGS  Left Ventricle: Left ventricular ejection fraction, by estimation, is 50 to 55%. The left ventricle has low normal function. The left ventricle has no regional wall motion abnormalities. Definity contrast agent was given IV to delineate the left ventricular endocardial borders. The left ventricular internal cavity size was normal in size. There is no left ventricular hypertrophy. Abnormal (paradoxical) septal motion consistent with post-operative status. Left ventricular diastolic parameters were normal. Right Ventricle: The right ventricular size is normal. Right vetricular wall thickness was not well visualized. Right ventricular systolic function is normal. Left Atrium: Left atrial size was not well visualized. Right Atrium: Right atrial size was not well visualized. Pericardium: There is no evidence of pericardial effusion. Mitral Valve: The mitral valve is normal in structure. Mild mitral valve regurgitation. No evidence of mitral valve stenosis. Tricuspid Valve: The tricuspid valve is grossly normal. Tricuspid valve regurgitation is trivial. No evidence of tricuspid stenosis. Aortic Valve: The aortic valve is tricuspid. Aortic  valve regurgitation is not visualized. No aortic stenosis is present. Aortic valve mean gradient measures 2.0 mmHg. Aortic valve peak gradient measures 3.5 mmHg. Aortic valve area, by VTI measures 2.16 cm. Pulmonic Valve: The pulmonic valve was not well visualized. Pulmonic valve regurgitation is not visualized. Aorta: Aortic root could not be assessed. IAS/Shunts: The interatrial septum  was not well visualized.  LEFT VENTRICLE PLAX 2D LVIDd:         4.60 cm      Diastology LVIDs:         3.60 cm      LV e' medial:    7.62 cm/s LV PW:         1.00 cm      LV E/e' medial:  10.7 LV IVS:        1.00 cm      LV e' lateral:   8.16 cm/s LVOT diam:     2.00 cm      LV E/e' lateral: 10.0 LV SV:         49 LV SV Index:   27 LVOT Area:     3.14 cm  LV Volumes (MOD) LV vol d, MOD A2C: 89.2 ml LV vol d, MOD A4C: 107.0 ml LV vol s, MOD A2C: 49.8 ml LV vol s, MOD A4C: 62.5 ml LV SV MOD A2C:     39.4 ml LV SV MOD A4C:     107.0 ml LV SV MOD BP:      43.6 ml LEFT ATRIUM         Index LA diam:    3.40 cm 1.92 cm/m  AORTIC VALVE AV Area (Vmax):    2.02 cm AV Area (Vmean):   2.05 cm AV Area (VTI):     2.16 cm AV Vmax:           93.70 cm/s AV Vmean:          64.200 cm/s AV VTI:            0.225 m AV Peak Grad:      3.5 mmHg AV Mean Grad:      2.0 mmHg LVOT Vmax:         60.30 cm/s LVOT Vmean:        41.900 cm/s LVOT VTI:          0.155 m LVOT/AV VTI ratio: 0.69 MITRAL VALVE MV Area (PHT): 4.60 cm    SHUNTS MV Decel Time: 165 msec    Systemic VTI:  0.16 m MR Peak grad: 74.5 mmHg    Systemic Diam: 2.00 cm MR Vmax:      431.50 cm/s MV E velocity: 81.80 cm/s MV A velocity: 81.20 cm/s MV E/A ratio:  1.01 Buford Dresser MD Electronically signed by Buford Dresser MD Signature Date/Time: 08/22/2022/7:17:17 PM    Final    DG Chest Port 1 View  Result Date: 08/21/2022 CLINICAL DATA:  [id number]. ED Triage Notes - Pt from home stated that about an hour ago she started experiencing chest pain radiating into the left shoulder  and into left jaw. Pt Pt also states that she is SOB and nauseas. Pt has cardiac and stroke hstory EXAM: PORTABLE CHEST 1 VIEW COMPARISON:  Chest x-ray 07/12/2021, CT chest 06/20/2020 FINDINGS: Persistent cardiomegaly. The heart and mediastinal contours are unchanged. Atherosclerotic plaque. Surgical changes overlie the mediastinum. Low lung volumes. Retrocardiac airspace opacity. Chronic coarsened markings with no overt pulmonary edema. No pleural effusion. No pneumothorax. No acute osseous abnormality. Interval total reverse left shoulder arthroplasty. Right upper quadrant surgical clips. IMPRESSION: 1. Low lung volumes with developing retrocardiac airspace opacity. Followup PA and lateral chest X-ray is recommended in 3-4 weeks following therapy to ensure resolution and exclude underlying malignancy. 2.  Aortic Atherosclerosis (ICD10-I70.0). Electronically Signed   By: Iven Finn M.D.   On: 08/21/2022 19:35  Subjective: Patient seen and examined at bedside.  Denies any chest pain or worsening shortness of breath.  Feels okay to go home today.  No overnight fever or vomiting reported.  Discharge Exam: Vitals:   08/24/22 0530 08/24/22 0927  BP:  (!) 163/79  Pulse: (!) 55 89  Resp: 15 18  Temp: 98 F (36.7 C) 97.6 F (36.4 C)  SpO2: 99% 92%    General: Pt is sleepy, wakes up slightly and answers some questions.  Currently on CPAP.   Cardiovascular: rate controlled, S1/S2 + Respiratory: bilateral decreased breath sounds at bases with some scattered crackles Abdominal: Soft, NT, ND, bowel sounds + Extremities: Trace lower extremity edema; no cyanosis    The results of significant diagnostics from this hospitalization (including imaging, microbiology, ancillary and laboratory) are listed below for reference.     Microbiology: Recent Results (from the past 240 hour(s))  SARS Coronavirus 2 by RT PCR (hospital order, performed in [facility] hospital lab) *cepheid single result  test* Anterior Nasal Swab     Status: None   Collection Time: 08/21/22  7:36 PM   Specimen: Anterior Nasal Swab  Result Value Ref Range Status   SARS Coronavirus 2 by RT PCR NEGATIVE NEGATIVE Final    Comment: (NOTE) SARS-CoV-2 target nucleic acids are NOT DETECTED.  The SARS-CoV-2 RNA is generally detectable in upper and lower respiratory specimens during the acute phase of infection. The lowest concentration of SARS-CoV-2 viral copies this assay can detect is 250 copies / mL. A negative result does not preclude SARS-CoV-2 infection and should not be used as the sole basis for treatment or other patient management decisions.  A negative result may occur with improper specimen collection / handling, submission of specimen other than nasopharyngeal swab, presence of viral mutation(s) within the areas targeted by this assay, and inadequate number of viral copies (<250 copies / mL). A negative result must be combined with clinical observations, patient history, and epidemiological information.  Fact Sheet for Patients:   https://www.patel.info/  Fact Sheet for Healthcare Providers: https://hall.com/  This test is not yet approved or  cleared by the [place] FDA and has been authorized for detection and/or diagnosis of SARS-CoV-2 by FDA under an Emergency Use Authorization (EUA).  This EUA will remain in effect (meaning this test can be used) for the duration of the COVID-19 declaration under Section 564(b)(1) of the Act, 21 U.S.C. section 360bbb-3(b)(1), unless the authorization is terminated or revoked sooner.  Performed at [facility], [address], [place], [place] [address]      Labs: BNP (last 3 results) Recent Labs    08/21/22 1845  BNP 024.0*   Basic Metabolic Panel: Recent Labs  Lab 08/21/22 1845 08/23/22 0644 08/24/22 0444  NA 141 139 139  K 3.9 3.4* 4.9  CL 100 105 106  CO2 '29 25 25   ' GLUCOSE 143* 149* 131*  BUN '14 18 21  ' CREATININE 1.18* 1.17* 1.47*  CALCIUM 10.4* 9.4 9.4  MG  --  1.9 2.0   Liver Function Tests: Recent Labs  Lab 08/23/22 0644  AST 20  ALT 16  ALKPHOS 102  BILITOT 0.5  PROT 6.4*  ALBUMIN 3.5   No results for input(s): "LIPASE", "AMYLASE" in the last 168 hours. No results for input(s): "AMMONIA" in the last 168 hours. CBC: Recent Labs  Lab 08/21/22 1845 08/22/22 0503 08/23/22 0644 08/24/22 0444  WBC 12.3* 9.3 9.0 9.0  HGB 15.3* 13.3 14.8 14.5  HCT 46.1*  39.2 43.5 44.8  MCV 90.9 89.5 90.1 92.4  PLT 308 238 253 248   Cardiac Enzymes: No results for input(s): "CKTOTAL", "CKMB", "CKMBINDEX", "TROPONINI" in the last 168 hours. BNP: Invalid input(s): "POCBNP" CBG: Recent Labs  Lab 08/23/22 0652 08/23/22 1110 08/23/22 1659 08/23/22 2121 08/24/22 0628  GLUCAP 140* 148* 145* 126* 122*   D-Dimer No results for input(s): "DDIMER" in the last 72 hours. Hgb A1c Recent Labs    08/22/22 0503  HGBA1C 6.7*   Lipid Profile Recent Labs    08/22/22 0503  CHOL 111  HDL 44  LDLCALC 46  TRIG 106  CHOLHDL 2.5   Thyroid function studies Recent Labs    08/23/22 0644  TSH 3.495   Anemia work up No results for input(s): "VITAMINB12", "FOLATE", "FERRITIN", "TIBC", "IRON", "RETICCTPCT" in the last 72 hours. Urinalysis    Component Value Date/Time   COLORURINE COLORLESS (A) 08/21/2022 1928   APPEARANCEUR CLEAR 08/21/2022 1928   LABSPEC 1.007 08/21/2022 1928   PHURINE 6.0 08/21/2022 1928   GLUCOSEU >1,000 (A) 08/21/2022 1928   HGBUR NEGATIVE 08/21/2022 1928   BILIRUBINUR NEGATIVE 08/21/2022 1928   KETONESUR NEGATIVE 08/21/2022 1928   PROTEINUR TRACE (A) 08/21/2022 1928   NITRITE NEGATIVE 08/21/2022 1928   LEUKOCYTESUR SMALL (A) 08/21/2022 1928   Sepsis Labs Recent Labs  Lab 08/21/22 1845 08/22/22 0503 08/23/22 0644 08/24/22 0444  WBC 12.3* 9.3 9.0 9.0   Microbiology Recent Results (from the past 240 hour(s))  SARS  Coronavirus 2 by RT PCR (hospital order, performed in [facility] hospital lab) *cepheid single result test* Anterior Nasal Swab     Status: None   Collection Time: 08/21/22  7:36 PM   Specimen: Anterior Nasal Swab  Result Value Ref Range Status   SARS Coronavirus 2 by RT PCR NEGATIVE NEGATIVE Final    Comment: (NOTE) SARS-CoV-2 target nucleic acids are NOT DETECTED.  The SARS-CoV-2 RNA is generally detectable in upper and lower respiratory specimens during the acute phase of infection. The lowest concentration of SARS-CoV-2 viral copies this assay can detect is 250 copies / mL. A negative result does not preclude SARS-CoV-2 infection and should not be used as the sole basis for treatment or other patient management decisions.  A negative result may occur with improper specimen collection / handling, submission of specimen other than nasopharyngeal swab, presence of viral mutation(s) within the areas targeted by this assay, and inadequate number of viral copies (<250 copies / mL). A negative result must be combined with clinical observations, patient history, and epidemiological information.  Fact Sheet for Patients:   https://www.patel.info/  Fact Sheet for Healthcare Providers: https://hall.com/  This test is not yet approved or  cleared by the [place] FDA and has been authorized for detection and/or diagnosis of SARS-CoV-2 by FDA under an Emergency Use Authorization (EUA).  This EUA will remain in effect (meaning this test can be used) for the duration of the COVID-19 declaration under Section 564(b)(1) of the Act, 21 U.S.C. section 360bbb-3(b)(1), unless the authorization is terminated or revoked sooner.  Performed at [facility], [address] [place], [place] [address]      Time coordinating discharge: 35 minutes  SIGNED:   Aline August, MD  [facility] 08/24/2022, 10:23 AM

## 2022-08-24 NOTE — Progress Notes (Signed)
   08/24/22 1100  Mobility  Activity Ambulated with assistance in hallway  Level of Assistance Modified independent, requires aide device or extra time  Assistive Device Other (Comment) (Hand rails)  Distance Ambulated (ft) 200 ft  Activity Response Tolerated fair  $Mobility charge 1 Mobility   Mobility Specialist Progress Note  Pre-Mobility: 95% SpO2 During Mobility:  93% SpO2 Post-Mobility: 65 HR, 166/84 BP, 94% SpO2  Pt was in bed and agreeable. X2 seated breaks d/t fatigue and c/o of dizziness. Returned to bed w/ all needs met and call bell in reach   Lucious Groves Mobility Specialist

## 2022-08-24 NOTE — Progress Notes (Addendum)
  RD consulted for nutrition education regarding diabetes.   Lab Results  Component Value Date   HGBA1C 6.7 (H) 08/22/2022   PTA DM medications are 1 mg amaryl daily and 750 mg metformin daily.   Labs reviewed: CBGS: 122-257 (inpatient orders for glycemic control are 25 mg jardiance daily, 0-15 units insulin aspart TID with meals, and 0-5 units insulin daily at bedtime).    RD provided "Carbohydrate Counting for People with Diabetes" handout from the Academy of Nutrition and Dietetics. Attached to AVS/ discharge summary.   RD also referred to [facility]'s Nutrition and Diabetes Education services further education and support.   Per RN, pt requesting education prior to discharge. RD attempted to contact pt via phone x 6, however, was unable to connect with pt due to line being busy at all attempts. Messaged RN to see when pt may be available, but have not received response. RD messaged RN and MD regarding plan and interventions.   ADDENDUM (1510): Spoke with RN, who reports pt is available. Spoke with pt over the phone, who reports new DM diagnosis and is unsure what she can eat. Pt reports she lives a lone and eats out or cooks simples meals. She usually consumes 2-3 meals per day (Breakfast: ham sandwich or grits, Dinner: stir fry or sandwich. Pt also snacks on fruit, peanut butter sandwiches, and drinks diet cola. Discussed different food groups and their effects on blood sugar, emphasizing carbohydrate-containing foods. Provided list of carbohydrates and recommended serving sizes of common foods.  Discussed importance of controlled and consistent carbohydrate intake throughout the day. Provided examples of ways to balance meals/snacks and encouraged intake of high-fiber, whole grain complex carbohydrates. Teach back method used.  Expect fair to good compliance.  Pt informed of handouts in discharge summary and referral to [facility]. Communicated to RN that pt received education.   Body mass  index is 33.2 kg/m. Pt meets criteria for obesity, class I based on current BMI. Obesity is a complex, chronic medical condition that is optimally managed by a multidisciplinary care team. Weight loss is not an ideal goal for an acute inpatient hospitalization. However, if further work-up for obesity is warranted, consider outpatient referral to outpatient bariatric service and/or [facility]'s Nutrition and Diabetes Education Services.    Current diet order is carb modified, patient is consuming approximately 100% of meals at this time. Labs and medications reviewed. No further nutrition interventions warranted at this time. RD contact information provided. If additional nutrition issues arise, please re-consult RD.  Judy [name], RD, LDN, [place] Registered Dietitian II Certified Diabetes Care and Education Specialist Please refer to [facility] for RD and/or RD on-call/weekend/after hours pager

## 2022-08-25 LAB — LIPOPROTEIN A (LPA): Lipoprotein (a): 200.3 nmol/L — ABNORMAL HIGH (ref <75.0)

## 2022-09-05 ENCOUNTER — Other Ambulatory Visit: Payer: Self-pay | Admitting: Cardiovascular Disease

## 2022-09-06 ENCOUNTER — Ambulatory Visit: Payer: Medicare Other | Admitting: Nurse Practitioner

## 2022-09-09 ENCOUNTER — Other Ambulatory Visit: Payer: Self-pay

## 2022-09-09 ENCOUNTER — Inpatient Hospital Stay (HOSPITAL_COMMUNITY)
Admission: EM | Admit: 2022-09-09 | Discharge: 2022-09-11 | DRG: 281 | Disposition: A | Discharge status: Home / Self Care | Payer: Medicare Other | Attending: Internal Medicine | Admitting: Internal Medicine

## 2022-09-09 ENCOUNTER — Encounter (HOSPITAL_COMMUNITY): Payer: Self-pay

## 2022-09-09 ENCOUNTER — Emergency Department (HOSPITAL_COMMUNITY): Payer: Medicare Other

## 2022-09-09 DIAGNOSIS — Z96612 Presence of left artificial shoulder joint: Secondary | ICD-10-CM | POA: Diagnosis present

## 2022-09-09 DIAGNOSIS — I5032 Chronic diastolic (congestive) heart failure: Secondary | ICD-10-CM | POA: Diagnosis present

## 2022-09-09 DIAGNOSIS — Z803 Family history of malignant neoplasm of breast: Secondary | ICD-10-CM

## 2022-09-09 DIAGNOSIS — I21A1 Myocardial infarction type 2: Secondary | ICD-10-CM | POA: Diagnosis present

## 2022-09-09 DIAGNOSIS — E785 Hyperlipidemia, unspecified: Secondary | ICD-10-CM | POA: Diagnosis present

## 2022-09-09 DIAGNOSIS — I255 Ischemic cardiomyopathy: Secondary | ICD-10-CM | POA: Diagnosis present

## 2022-09-09 DIAGNOSIS — Z9049 Acquired absence of other specified parts of digestive tract: Secondary | ICD-10-CM

## 2022-09-09 DIAGNOSIS — E871 Hypo-osmolality and hyponatremia: Secondary | ICD-10-CM | POA: Diagnosis present

## 2022-09-09 DIAGNOSIS — I4892 Unspecified atrial flutter: Principal | ICD-10-CM | POA: Diagnosis present

## 2022-09-09 DIAGNOSIS — Z8052 Family history of malignant neoplasm of bladder: Secondary | ICD-10-CM

## 2022-09-09 DIAGNOSIS — I480 Paroxysmal atrial fibrillation: Secondary | ICD-10-CM | POA: Diagnosis present

## 2022-09-09 DIAGNOSIS — Z794 Long term (current) use of insulin: Secondary | ICD-10-CM

## 2022-09-09 DIAGNOSIS — Z955 Presence of coronary angioplasty implant and graft: Secondary | ICD-10-CM

## 2022-09-09 DIAGNOSIS — E669 Obesity, unspecified: Secondary | ICD-10-CM | POA: Diagnosis present

## 2022-09-09 DIAGNOSIS — E1122 Type 2 diabetes mellitus with diabetic chronic kidney disease: Secondary | ICD-10-CM | POA: Diagnosis present

## 2022-09-09 DIAGNOSIS — Z8673 Personal history of transient ischemic attack (TIA), and cerebral infarction without residual deficits: Secondary | ICD-10-CM

## 2022-09-09 DIAGNOSIS — Z79899 Other long term (current) drug therapy: Secondary | ICD-10-CM

## 2022-09-09 DIAGNOSIS — R0789 Other chest pain: Secondary | ICD-10-CM | POA: Diagnosis not present

## 2022-09-09 DIAGNOSIS — E1165 Type 2 diabetes mellitus with hyperglycemia: Secondary | ICD-10-CM | POA: Diagnosis present

## 2022-09-09 DIAGNOSIS — I471 Supraventricular tachycardia, unspecified: Secondary | ICD-10-CM | POA: Diagnosis present

## 2022-09-09 DIAGNOSIS — Z7982 Long term (current) use of aspirin: Secondary | ICD-10-CM

## 2022-09-09 DIAGNOSIS — Z7901 Long term (current) use of anticoagulants: Secondary | ICD-10-CM

## 2022-09-09 DIAGNOSIS — I2510 Atherosclerotic heart disease of native coronary artery without angina pectoris: Secondary | ICD-10-CM | POA: Diagnosis present

## 2022-09-09 DIAGNOSIS — I214 Non-ST elevation (NSTEMI) myocardial infarction: Secondary | ICD-10-CM | POA: Diagnosis present

## 2022-09-09 DIAGNOSIS — E876 Hypokalemia: Secondary | ICD-10-CM | POA: Diagnosis present

## 2022-09-09 DIAGNOSIS — Z7984 Long term (current) use of oral hypoglycemic drugs: Secondary | ICD-10-CM

## 2022-09-09 DIAGNOSIS — I130 Hypertensive heart and chronic kidney disease with heart failure and stage 1 through stage 4 chronic kidney disease, or unspecified chronic kidney disease: Secondary | ICD-10-CM | POA: Diagnosis present

## 2022-09-09 DIAGNOSIS — Z6833 Body mass index (BMI) 33.0-33.9, adult: Secondary | ICD-10-CM

## 2022-09-09 DIAGNOSIS — N1832 Chronic kidney disease, stage 3b: Secondary | ICD-10-CM | POA: Diagnosis present

## 2022-09-09 DIAGNOSIS — Z8616 Personal history of COVID-19: Secondary | ICD-10-CM

## 2022-09-09 LAB — COMPREHENSIVE METABOLIC PANEL
ALT: 17 U/L (ref 0–44)
AST: 20 U/L (ref 15–41)
Albumin: 3.4 g/dL — ABNORMAL LOW (ref 3.5–5.0)
Alkaline Phosphatase: 114 U/L (ref 38–126)
Anion gap: 12 (ref 5–15)
BUN: 23 mg/dL (ref 8–23)
CO2: 24 mmol/L (ref 22–32)
Calcium: 9.4 mg/dL (ref 8.9–10.3)
Chloride: 104 mmol/L (ref 98–111)
Creatinine, Ser: 1.45 mg/dL — ABNORMAL HIGH (ref 0.44–1.00)
GFR, Estimated: 36 mL/min — ABNORMAL LOW (ref >60)
Glucose, Bld: 159 mg/dL — ABNORMAL HIGH (ref 70–99)
Potassium: 3.2 mmol/L — ABNORMAL LOW (ref 3.5–5.1)
Sodium: 140 mmol/L (ref 135–145)
Total Bilirubin: 0.6 mg/dL (ref 0.3–1.2)
Total Protein: 6 g/dL — ABNORMAL LOW (ref 6.5–8.1)

## 2022-09-09 LAB — CBC WITH DIFFERENTIAL/PLATELET
Abs Immature Granulocytes: 0.04 10*3/uL (ref 0.00–0.07)
Basophils Absolute: 0.1 10*3/uL (ref 0.0–0.1)
Basophils Relative: 1 %
Eosinophils Absolute: 0.2 10*3/uL (ref 0.0–0.5)
Eosinophils Relative: 2 %
HCT: 43.9 % (ref 36.0–46.0)
Hemoglobin: 14.4 g/dL (ref 12.0–15.0)
Immature Granulocytes: 0 %
Lymphocytes Relative: 16 %
Lymphs Abs: 1.7 10*3/uL (ref 0.7–4.0)
MCH: 30 pg (ref 26.0–34.0)
MCHC: 32.8 g/dL (ref 30.0–36.0)
MCV: 91.5 fL (ref 80.0–100.0)
Monocytes Absolute: 0.6 10*3/uL (ref 0.1–1.0)
Monocytes Relative: 5 %
Neutro Abs: 8.2 10*3/uL — ABNORMAL HIGH (ref 1.7–7.7)
Neutrophils Relative %: 76 %
Platelets: 247 10*3/uL (ref 150–400)
RBC: 4.8 MIL/uL (ref 3.87–5.11)
RDW: 12.8 % (ref 11.5–15.5)
WBC: 10.8 10*3/uL — ABNORMAL HIGH (ref 4.0–10.5)
nRBC: 0 % (ref 0.0–0.2)

## 2022-09-09 LAB — MAGNESIUM: Magnesium: 1.7 mg/dL (ref 1.7–2.4)

## 2022-09-09 LAB — TROPONIN I (HIGH SENSITIVITY): Troponin I (High Sensitivity): 703 ng/L (ref <18)

## 2022-09-09 NOTE — ED Triage Notes (Signed)
Pt arrived by EMS from home. Pt started having chest pain that felt like stabbing to her back at 9am this morning.  EMS identified pt to be in SVT and gave a '6mg'$  dose of adenosine without change then gave '12mg'$  dose with a change back to NSR at a rate of 71.   Pt alert and oriented. States that pain is now a 3/10 and she is feeling a little dizzy   EMS also gave 241m NS bolus

## 2022-09-09 NOTE — ED Provider Notes (Signed)
[facility] Provider Note   CSN: [id number] Arrival date & time: 09/09/22  2216     History {Add pertinent medical, surgical, social history, OB history to HPI:1} Chief Complaint  Patient presents with   Chest Pain    Judy [name] is a 81 y.o. female.   Chest Pain      Home Medications Prior to Admission medications   Medication Sig Start Date End Date Taking? Authorizing Provider  Accu-Chek Softclix Lancets lancets USE TO CHECK BLOOD SUGAR TWICE DAILY 05/11/21   Elsie Stain, MD  apixaban (ELIQUIS) 5 MG TABS tablet TAKE ONE TABLET BY MOUTH TWICE DAILY 07/23/22   O'Neal, Cassie Freer, MD  aspirin EC 81 MG tablet Take 1 tablet (81 mg total) by mouth daily. Swallow whole. 02/23/21   O'NealCassie Freer, MD  atorvastatin (LIPITOR) 20 MG tablet Take 1 tablet (20 mg total) by mouth at bedtime. 05/04/20   O'Neal, Cassie Freer, MD  Cyanocobalamin (B-12 COMPLIANCE INJECTION) 1000 MCG/ML KIT Inject 1,000 mcg into the muscle every 30 (thirty) days.    [provider]  DULoxetine (CYMBALTA) 60 MG capsule Take 60 mg by mouth daily.    [provider]  empagliflozin (JARDIANCE) 25 MG TABS tablet Take 25 mg by mouth at bedtime.    [provider]  furosemide (LASIX) 40 MG tablet Take 1 tablet (40 mg total) by mouth daily. 08/24/22 09/23/22  Aline August, MD  glucose blood (ACCU-CHEK AVIVA) test strip Test twice daily 04/04/20   Elsie Stain, MD  isosorbide mononitrate (IMDUR) 30 MG 24 hr tablet Take 1 tablet (30 mg total) by mouth daily. 08/25/22   Aline August, MD  losartan (COZAAR) 25 MG tablet Take 1 tablet (25 mg total) by mouth daily. 04/04/20   Elsie Stain, MD  metFORMIN (GLUCOPHAGE-XR) 750 MG 24 hr tablet Take 750 mg by mouth daily with breakfast. 10/11/20   [provider]  metoprolol tartrate (LOPRESSOR) 25 MG tablet Take 1 tablet (25 mg total) by mouth 2 (two) times daily. 08/24/22   Aline August, MD  NON FORMULARY 1 each by Other route See admin instructions. CPAP machine nightly    [provider]  ondansetron (ZOFRAN) 4 MG tablet Take 1 tablet (4 mg total) by mouth every 8 (eight) hours as needed for nausea or vomiting. 06/21/22   Shuford, Olivia Mackie, PA-C  spironolactone (ALDACTONE) 25 MG tablet Take 0.5 tablets (12.5 mg total) by mouth daily. 08/25/22   Aline August, MD  tiZANidine (ZANAFLEX) 4 MG tablet Take 1 tablet (4 mg total) by mouth every 8 (eight) hours as needed for muscle spasms. 06/21/22 06/21/23  Shuford, Olivia Mackie, PA-C  traMADol (ULTRAM) 50 MG tablet Take 50-100 mg by mouth every 8 (eight) hours as needed for moderate pain. 02/14/22   [provider]  traZODone (DESYREL) 100 MG tablet Take 150 mg by mouth at bedtime. 04/28/20   [provider]      Allergies    Patient has no known allergies.    Review of Systems   Review of Systems  Cardiovascular:  Positive for chest pain.    Physical Exam Updated Vital Signs BP 132/82   Pulse 68   Temp 97.9 F (36.6 C) (Oral)   Resp (!) 22   Ht 5' (1.524 m)   Wt 77 kg   SpO2 96%   BMI 33.16 kg/m  Physical Exam  ED Results / Procedures / Treatments   Labs (all  labs ordered are listed, but only abnormal results are displayed) Labs Reviewed - No data to display  EKG None  Radiology No results found.  Procedures Procedures  {Document cardiac monitor, telemetry assessment procedure when appropriate:1}  Medications Ordered in ED Medications - No data to display  ED Course/ Medical Decision Making/ A&P                           Medical Decision Making  ***  {Document critical care time when appropriate:1} {Document review of labs and clinical decision tools ie heart score, Chads2Vasc2 etc:1}  {Document your independent review of radiology images, and any outside records:1} {Document your discussion with family members, caretakers, and with consultants:1} {Document social  determinants of health affecting pt's care:1} {Document your decision making why or why not admission, treatments were needed:1} Final Clinical Impression(s) / ED Diagnoses Final diagnoses:  None    Rx / DC Orders ED Discharge Orders     None

## 2022-09-10 ENCOUNTER — Inpatient Hospital Stay (HOSPITAL_COMMUNITY): Payer: Medicare Other

## 2022-09-10 DIAGNOSIS — I4892 Unspecified atrial flutter: Secondary | ICD-10-CM | POA: Diagnosis present

## 2022-09-10 DIAGNOSIS — Z955 Presence of coronary angioplasty implant and graft: Secondary | ICD-10-CM | POA: Diagnosis not present

## 2022-09-10 DIAGNOSIS — I5032 Chronic diastolic (congestive) heart failure: Secondary | ICD-10-CM | POA: Diagnosis present

## 2022-09-10 DIAGNOSIS — E871 Hypo-osmolality and hyponatremia: Secondary | ICD-10-CM | POA: Diagnosis present

## 2022-09-10 DIAGNOSIS — E1122 Type 2 diabetes mellitus with diabetic chronic kidney disease: Secondary | ICD-10-CM | POA: Diagnosis present

## 2022-09-10 DIAGNOSIS — N1832 Chronic kidney disease, stage 3b: Secondary | ICD-10-CM | POA: Diagnosis present

## 2022-09-10 DIAGNOSIS — Z7982 Long term (current) use of aspirin: Secondary | ICD-10-CM | POA: Diagnosis not present

## 2022-09-10 DIAGNOSIS — Z8616 Personal history of COVID-19: Secondary | ICD-10-CM | POA: Diagnosis not present

## 2022-09-10 DIAGNOSIS — I214 Non-ST elevation (NSTEMI) myocardial infarction: Secondary | ICD-10-CM

## 2022-09-10 DIAGNOSIS — I130 Hypertensive heart and chronic kidney disease with heart failure and stage 1 through stage 4 chronic kidney disease, or unspecified chronic kidney disease: Secondary | ICD-10-CM | POA: Diagnosis present

## 2022-09-10 DIAGNOSIS — I471 Supraventricular tachycardia, unspecified: Secondary | ICD-10-CM | POA: Diagnosis present

## 2022-09-10 DIAGNOSIS — I21A1 Myocardial infarction type 2: Secondary | ICD-10-CM | POA: Diagnosis present

## 2022-09-10 DIAGNOSIS — E785 Hyperlipidemia, unspecified: Secondary | ICD-10-CM | POA: Diagnosis present

## 2022-09-10 DIAGNOSIS — I255 Ischemic cardiomyopathy: Secondary | ICD-10-CM | POA: Diagnosis present

## 2022-09-10 DIAGNOSIS — E876 Hypokalemia: Secondary | ICD-10-CM | POA: Diagnosis present

## 2022-09-10 DIAGNOSIS — R0789 Other chest pain: Secondary | ICD-10-CM | POA: Diagnosis present

## 2022-09-10 DIAGNOSIS — Z803 Family history of malignant neoplasm of breast: Secondary | ICD-10-CM | POA: Diagnosis not present

## 2022-09-10 DIAGNOSIS — Z79899 Other long term (current) drug therapy: Secondary | ICD-10-CM | POA: Diagnosis not present

## 2022-09-10 DIAGNOSIS — E1165 Type 2 diabetes mellitus with hyperglycemia: Secondary | ICD-10-CM | POA: Diagnosis present

## 2022-09-10 DIAGNOSIS — I480 Paroxysmal atrial fibrillation: Secondary | ICD-10-CM | POA: Diagnosis present

## 2022-09-10 DIAGNOSIS — Z8673 Personal history of transient ischemic attack (TIA), and cerebral infarction without residual deficits: Secondary | ICD-10-CM | POA: Diagnosis not present

## 2022-09-10 DIAGNOSIS — I2510 Atherosclerotic heart disease of native coronary artery without angina pectoris: Secondary | ICD-10-CM | POA: Diagnosis present

## 2022-09-10 DIAGNOSIS — Z8052 Family history of malignant neoplasm of bladder: Secondary | ICD-10-CM | POA: Diagnosis not present

## 2022-09-10 DIAGNOSIS — Z96612 Presence of left artificial shoulder joint: Secondary | ICD-10-CM | POA: Diagnosis present

## 2022-09-10 DIAGNOSIS — E669 Obesity, unspecified: Secondary | ICD-10-CM | POA: Diagnosis present

## 2022-09-10 DIAGNOSIS — Z9049 Acquired absence of other specified parts of digestive tract: Secondary | ICD-10-CM | POA: Diagnosis not present

## 2022-09-10 LAB — BASIC METABOLIC PANEL
Anion gap: 8 (ref 5–15)
BUN: 22 mg/dL (ref 8–23)
CO2: 28 mmol/L (ref 22–32)
Calcium: 9.2 mg/dL (ref 8.9–10.3)
Chloride: 102 mmol/L (ref 98–111)
Creatinine, Ser: 1.38 mg/dL — ABNORMAL HIGH (ref 0.44–1.00)
GFR, Estimated: 39 mL/min — ABNORMAL LOW (ref >60)
Glucose, Bld: 137 mg/dL — ABNORMAL HIGH (ref 70–99)
Potassium: 3.6 mmol/L (ref 3.5–5.1)
Sodium: 138 mmol/L (ref 135–145)

## 2022-09-10 LAB — PHOSPHORUS: Phosphorus: 2.9 mg/dL (ref 2.5–4.6)

## 2022-09-10 LAB — HEPARIN LEVEL (UNFRACTIONATED)
Heparin Unfractionated: >1.1 IU/mL — ABNORMAL HIGH (ref 0.30–0.70)
Heparin Unfractionated: >1.1 IU/mL — ABNORMAL HIGH (ref 0.30–0.70)

## 2022-09-10 LAB — CBC
HCT: 42.9 % (ref 36.0–46.0)
Hemoglobin: 14.2 g/dL (ref 12.0–15.0)
MCH: 29.8 pg (ref 26.0–34.0)
MCHC: 33.1 g/dL (ref 30.0–36.0)
MCV: 89.9 fL (ref 80.0–100.0)
Platelets: 235 10*3/uL (ref 150–400)
RBC: 4.77 MIL/uL (ref 3.87–5.11)
RDW: 12.6 % (ref 11.5–15.5)
WBC: 10.9 10*3/uL — ABNORMAL HIGH (ref 4.0–10.5)
nRBC: 0 % (ref 0.0–0.2)

## 2022-09-10 LAB — BRAIN NATRIURETIC PEPTIDE: B Natriuretic Peptide: 144.7 pg/mL — ABNORMAL HIGH (ref 0.0–100.0)

## 2022-09-10 LAB — MAGNESIUM: Magnesium: 1.8 mg/dL (ref 1.7–2.4)

## 2022-09-10 LAB — ECHOCARDIOGRAM LIMITED
Area-P 1/2: 3.12 cm2
Calc EF: 64 %
Height: 60 in
S' Lateral: 3.9 cm
Single Plane A2C EF: 61.2 %
Single Plane A4C EF: 60.6 %
Weight: 2716.8 oz

## 2022-09-10 LAB — TROPONIN I (HIGH SENSITIVITY): Troponin I (High Sensitivity): 1241 ng/L (ref <18)

## 2022-09-10 LAB — APTT
aPTT: 49 seconds — ABNORMAL HIGH (ref 24–36)
aPTT: 72 seconds — ABNORMAL HIGH (ref 24–36)

## 2022-09-10 MED ORDER — LACTATED RINGERS IV SOLN: INTRAVENOUS | Status: DC

## 2022-09-10 MED ORDER — ASPIRIN 81 MG PO TBEC
81.0000 mg | DELAYED_RELEASE_TABLET | Freq: Every day | ORAL | Status: DC
  Administered 2022-09-10 – 2022-09-11 (×2): 81 mg via ORAL
  Filled 2022-09-10 (×2): qty 1

## 2022-09-10 MED ORDER — HEPARIN (PORCINE) 25000 UT/250ML-% IV SOLN
1150.0000 [IU]/h | INTRAVENOUS | Status: DC
  Administered 2022-09-10: 900 [IU]/h via INTRAVENOUS
  Filled 2022-09-10 (×2): qty 250

## 2022-09-10 MED ORDER — PERFLUTREN LIPID MICROSPHERE
1.0000 mL | INTRAVENOUS | Status: AC | PRN
  Administered 2022-09-10: 3 mL via INTRAVENOUS

## 2022-09-10 MED ORDER — FUROSEMIDE 20 MG PO TABS
40.0000 mg | ORAL_TABLET | Freq: Every day | ORAL | Status: DC
  Administered 2022-09-10: 40 mg via ORAL
  Filled 2022-09-10: qty 2

## 2022-09-10 MED ORDER — ISOSORBIDE MONONITRATE ER 30 MG PO TB24
30.0000 mg | ORAL_TABLET | Freq: Every day | ORAL | Status: DC
  Administered 2022-09-10 – 2022-09-11 (×2): 30 mg via ORAL
  Filled 2022-09-10 (×2): qty 1

## 2022-09-10 MED ORDER — DULOXETINE HCL 60 MG PO CPEP
60.0000 mg | ORAL_CAPSULE | Freq: Every day | ORAL | Status: DC
  Administered 2022-09-10 – 2022-09-11 (×2): 60 mg via ORAL
  Filled 2022-09-10: qty 2
  Filled 2022-09-10: qty 1

## 2022-09-10 MED ORDER — ATORVASTATIN CALCIUM 10 MG PO TABS
20.0000 mg | ORAL_TABLET | Freq: Every day | ORAL | Status: DC
  Administered 2022-09-10: 20 mg via ORAL
  Filled 2022-09-10: qty 2

## 2022-09-10 MED ORDER — AMIODARONE HCL 200 MG PO TABS: 200.0000 mg | ORAL_TABLET | Freq: Every day | ORAL | Status: DC

## 2022-09-10 MED ORDER — AMIODARONE HCL 200 MG PO TABS
200.0000 mg | ORAL_TABLET | Freq: Two times a day (BID) | ORAL | Status: DC
  Administered 2022-09-10 – 2022-09-11 (×4): 200 mg via ORAL
  Filled 2022-09-10 (×4): qty 1

## 2022-09-10 MED ORDER — ASPIRIN 81 MG PO CHEW
162.0000 mg | CHEWABLE_TABLET | Freq: Once | ORAL | Status: AC
  Administered 2022-09-10: 162 mg via ORAL
  Filled 2022-09-10: qty 2

## 2022-09-10 MED ORDER — POTASSIUM CHLORIDE CRYS ER 20 MEQ PO TBCR
40.0000 meq | EXTENDED_RELEASE_TABLET | Freq: Two times a day (BID) | ORAL | Status: AC
  Administered 2022-09-10 (×2): 40 meq via ORAL
  Filled 2022-09-10 (×2): qty 2

## 2022-09-10 MED ORDER — METOPROLOL TARTRATE 12.5 MG HALF TABLET
12.5000 mg | ORAL_TABLET | Freq: Two times a day (BID) | ORAL | Status: DC
  Administered 2022-09-10 – 2022-09-11 (×4): 12.5 mg via ORAL
  Filled 2022-09-10 (×4): qty 1

## 2022-09-10 NOTE — Progress Notes (Signed)
ANTICOAGULATION CONSULT NOTE - Initial Consult  Pharmacy Consult for Heparin (Apixaban on hold) Indication: NSTEMI, atrial fib/flutter  No Known Allergies  Patient Measurements: Height: 5' (152.4 cm) Weight: 77 kg (169 lb 12.8 oz) IBW/kg (Calculated) : 45.5 Vital Signs: Temp: 97.9 F (36.6 C) (10/08 2226) Temp Source: Oral (10/08 2226) BP: 126/86 (10/09 0130) Pulse Rate: 72 (10/09 0130)  Labs: Recent Labs    09/09/22 2305 09/10/22 0026  HGB 14.4  --   HCT 43.9  --   PLT 247  --   CREATININE 1.45*  --   TROPONINIHS 703* 1,241*    Estimated Creatinine Clearance: 28.4 mL/min (A) (by C-G formula based on SCr of 1.45 mg/dL (H)).   Medical History: Past Medical History:  Diagnosis Date   Anxiety    Arthritis    Back pain    Coronary artery disease    Depression    Diabetes ([place])    Dyspnea    Gout    Heart attack [facility])    March 2021   History of kidney stones    Hyperlipidemia    Hypertension    Leg edema    Pancreatitis, chronic ([place]) 03/2016   Pneumonia due to COVID-19 virus 12/03/2019   Sleep apnea    C-PAP   Stroke [facility])    March 2021 Rt side weakness    Assessment: 81 y/o F in the ED with chest pain and elevated troponin, on apixaban PTA for afib/flutter, holding apixaban and starting heparin, last dose of apixaban was 10/8 AM, ok to start heparin now, anticipate using aPTT to dose for now.  Goal of Therapy:  Heparin level 0.3-0.7 units/ml aPTT 66-102 seconds Monitor platelets by anticoagulation protocol: Yes   Plan:  Start heparin at 900 units/hr 0900 heparin level and aPTT Daily CBC, heparin level, and aPTT Monitor for bleeding  Narda Bonds, PharmD, BCPS Clinical Pharmacist Phone: [phone]

## 2022-09-10 NOTE — Progress Notes (Signed)
ANTICOAGULATION CONSULT NOTE - Initial Consult  Pharmacy Consult for Heparin (Apixaban on hold) Indication: NSTEMI, atrial fib/flutter  No Known Allergies  Patient Measurements: Height: 5' (152.4 cm) Weight: 77 kg (169 lb 12.8 oz) IBW/kg (Calculated) : 45.5 Heparin dosing weight: 63kg   Vital Signs: Temp: 97.8 F (36.6 C) (10/09 0553) Temp Source: Oral (10/09 0553) BP: 114/75 (10/09 0800) Pulse Rate: 58 (10/09 0800)  Labs: Recent Labs    09/09/22 2305 09/10/22 0026 09/10/22 0339 09/10/22 0920  HGB 14.4  --  14.2  --   HCT 43.9  --  42.9  --   PLT 247  --  235  --   APTT  --   --   --  49*  HEPARINUNFRC  --   --   --  >1.10*  CREATININE 1.45*  --  1.38*  --   TROPONINIHS 703* 1,241*  --   --      Estimated Creatinine Clearance: 29.8 mL/min (A) (by C-G formula based on SCr of 1.38 mg/dL (H)).   Medical History: Past Medical History:  Diagnosis Date   Anxiety    Arthritis    Back pain    Coronary artery disease    Depression    Diabetes ([place])    Dyspnea    Gout    Heart attack [facility])    March 2021   History of kidney stones    Hyperlipidemia    Hypertension    Leg edema    Pancreatitis, chronic ([place]) 03/2016   Pneumonia due to COVID-19 virus 12/03/2019   Sleep apnea    C-PAP   Stroke [facility])    March 2021 Rt side weakness    Assessment: 81 y/o F in the ED with chest pain and elevated troponin, on apixaban PTA for afib/flutter, holding apixaban and starting heparin, last dose of apixaban was 10/8 AM, ok to start heparin now, anticipate using aPTT to dose for now.  HL and APTT not correlating, aPTT subtherapeutic at 49 and HL supratherapeutic at 1.1. Hemoglobin and renal function stable. Per cards note will try medical therapy before going to cath. Potential to restart Eliquis on 10/10.   Goal of Therapy:  Heparin level 0.3-0.7 units/ml aPTT 66-102 seconds Monitor platelets by anticoagulation protocol: Yes   Plan:  Increase heparin to 1050 units/hr  (16u/kg)  Check HL and aPTT in 8 hours until correlation.  Daily CBC, heparin level, and aPTT Monitor for bleeding  Esmeralda Arthur, PharmD  Clinical Pharmacist Phone: [phone]

## 2022-09-10 NOTE — Progress Notes (Signed)
ANTICOAGULATION CONSULT NOTE   Pharmacy Consult for Heparin (Apixaban on hold) Indication: NSTEMI, atrial fib/flutter  No Known Allergies  Patient Measurements: Height: 5' (152.4 cm) Weight: 77 kg (169 lb 12.8 oz) IBW/kg (Calculated) : 45.5 Heparin dosing weight: 63kg   Vital Signs: Temp: 98.2 F (36.8 C) (10/09 2103) Temp Source: Oral (10/09 2103) BP: 120/70 (10/09 2103) Pulse Rate: 64 (10/09 2103)  Labs: Recent Labs    09/09/22 2305 09/10/22 0026 09/10/22 0339 09/10/22 0920 09/10/22 2109  HGB 14.4  --  14.2  --   --   HCT 43.9  --  42.9  --   --   PLT 247  --  235  --   --   APTT  --   --   --  49* 72*  HEPARINUNFRC  --   --   --  >1.10* >1.10*  CREATININE 1.45*  --  1.38*  --   --   TROPONINIHS 703* 1,241*  --   --   --      Estimated Creatinine Clearance: 29.8 mL/min (A) (by C-G formula based on SCr of 1.38 mg/dL (H)).   Medical History: Past Medical History:  Diagnosis Date   Anxiety    Arthritis    Back pain    Coronary artery disease    Depression    Diabetes ([place])    Dyspnea    Gout    Heart attack [facility])    March 2021   History of kidney stones    Hyperlipidemia    Hypertension    Leg edema    Pancreatitis, chronic ([place]) 03/2016   Pneumonia due to COVID-19 virus 12/03/2019   Sleep apnea    C-PAP   Stroke [facility])    March 2021 Rt side weakness    Assessment: 81 y/o F in the ED with chest pain and elevated troponin, on apixaban PTA for afib/flutter, holding apixaban and starting heparin, last dose of apixaban was 10/8 AM, ok to start heparin now, anticipate using aPTT to dose for now.  S/p rate increase to 1050 units/hr aPTT is therapeutic, anti-Xa level remains elevated as expected from apixaban dose  Goal of Therapy:  Heparin level 0.3-0.7 units/ml aPTT 66-102 seconds Monitor platelets by anticoagulation protocol: Yes   Plan:  Continue heparin gtt at 1050 units/hr Daily aPTT/HL until correlated, CBC, s/s bleeding F/u ability to  transition back to PO  Bertis Ruddy, PharmD Clinical Pharmacist ED Pharmacist Phone # [phone] 09/10/2022 9:42 PM

## 2022-09-10 NOTE — Progress Notes (Signed)
Patient admitted early this morning for chest pain.  Cardiology was consulted.  I have reviewed patient's medical records including this morning's H&P, current vitals, labs and medications myself.  Patient seen and examined at bedside and plan of care discussed with her.  Currently on heparin drip.  Follow further cardiology recommendations.  Currently NPO.

## 2022-09-10 NOTE — ED Provider Notes (Signed)
Care assumed at 2330.  Patient with 3 of paroxysmal atrial fibrillation, coronary artery disease status post stenting here for evaluation of chest pain in setting of A-fib with RVR prehospital.  Patient did convert to sinus rhythm after adenosine x2.  She does have some mild persistent chest pain but EKG is without acute ischemic changes.  Care assumed pending labs.  Labs significant for elevated troponin to 700.  She was treated with aspirin (received 162 mg prior to ED arrival) as well as heparin drip for NSTEMI.  Cardiology consulted due to elevated troponin, recent NSTEMI with abnormal cath.  CRITICAL CARE Performed by: Quintella Reichert   Total critical care time: 35 minutes  Critical care time was exclusive of separately billable procedures and treating other patients.  Critical care was necessary to treat or prevent imminent or life-threatening deterioration.  Critical care was time spent personally by me on the following activities: development of treatment plan with patient and/or surrogate as well as nursing, discussions with consultants, evaluation of patient's response to treatment, examination of patient, obtaining history from patient or surrogate, ordering and performing treatments and interventions, ordering and review of laboratory studies, ordering and review of radiographic studies, pulse oximetry and re-evaluation of patient's condition.    Quintella Reichert, MD 09/10/22 [phone]

## 2022-09-10 NOTE — H&P (Signed)
History and Physical  Judy [name]:[id number] DOB: [birth year] DOA: 09/09/2022  Referring physician: Dr. Ralene Bathe, [place]  PCP: Michael Boston, MD  Outpatient Specialists: Cardiology Patient coming from: Home  Chief Complaint: Chest pain   HPI: Judy [name] is a 81 y.o. female with medical history significant for recent admission for NSTEMI and A-flutter/A-fib with RVR, ischemic cardiomyopathy, coronary artery disease status post CABG, paroxysmal A-fib/flutter, type 2 diabetes, hypertension, hyperlipidemia, CKD 3B, bilateral carotid stenosis, HFpEF, OSA, gout, who presented to [facility] ED from home due to persistent chest pain.  Onset around 10 AM on 09/09/22.  EMS was activated.  The patient received adenosine x2 via EMS due to concern for SVT.  In the ED, converted back to sinus rhythm.  Work-up revealed up trending high-sensitivity troponin.  EDP discussed the case with cardiology who recommended IV heparin drip, NPO, for possible heart cath in the morning versus medical management.  TRH, hospitalist service was asked to admit.  ED Course: Tmax 97.6.  BP 110/69, pulse 71, respiratory 19, O2 saturation 97% on room air.  Lab studies remarkable for high-sensitivity troponin 703, 1241.  Serum potassium 3.2, glucose 159, creatinine 1.45 at baseline.  BNP 144.  WBC 10.8.  Review of Systems: Review of systems as noted in the HPI. All other systems reviewed and are negative.   Past Medical History:  Diagnosis Date   Anxiety    Arthritis    Back pain    Coronary artery disease    Depression    Diabetes ([place])    Dyspnea    Gout    Heart attack [facility])    March 2021   History of kidney stones    Hyperlipidemia    Hypertension    Leg edema    Pancreatitis, chronic ([place]) 03/2016   Pneumonia due to COVID-19 virus 12/03/2019   Sleep apnea    C-PAP   Stroke [facility])    March 2021 Rt side weakness   Past Surgical History:  Procedure Laterality Date   BACK SURGERY  2004   BREAST LUMPECTOMY WITH  RADIOACTIVE SEED LOCALIZATION Right 10/23/2019   Procedure: RIGHT BREAST LUMPECTOMY WITH RADIOACTIVE SEED LOCALIZATION;  Surgeon: Jovita Kussmaul, MD;  Location: [place];  Service: General;  Laterality: Right;   CHOLECYSTECTOMY  2001   cardiac event during surgery   COLONOSCOPY  2013   CORONARY ARTERY BYPASS GRAFT N/A 03/03/2020   Procedure: CORONARY ARTERY BYPASS GRAFTING (CABG), ON PUMP, TIMES FIVE, USING LEFT INTERNAL MAMMARY ARTERY AND ENDOSCOPICALLY HARVESTED BILATERAL GREATER SAPHENOUS VEINS -flow track only;  Surgeon: Lajuana Matte, MD;  Location: [place];  Service: Open Heart Surgery;  Laterality: N/A;   LEFT HEART CATH AND CORONARY ANGIOGRAPHY N/A 03/01/2020   Procedure: LEFT HEART CATH AND CORONARY ANGIOGRAPHY;  Surgeon: Belva Crome, MD;  Location: [place] CV LAB;  Service: Cardiovascular;  Laterality: N/A;   LEFT HEART CATH AND CORS/GRAFTS ANGIOGRAPHY N/A 08/23/2022   Procedure: LEFT HEART CATH AND CORS/GRAFTS ANGIOGRAPHY;  Surgeon: Martinique, Peter M, MD;  Location: [place] CV LAB;  Service: Cardiovascular;  Laterality: N/A;   REVERSE SHOULDER ARTHROPLASTY Left 06/21/2022   Procedure: REVERSE SHOULDER ARTHROPLASTY;  Surgeon: Justice Britain, MD;  Location: [facility];  Service: Orthopedics;  Laterality: Left;  170mn   TEE WITHOUT CARDIOVERSION N/A 03/03/2020   Procedure: TRANSESOPHAGEAL ECHOCARDIOGRAM (TEE);  Surgeon: LLajuana Matte MD;  Location: [place]  Service: Open Heart Surgery;  Laterality: N/A;    Social History:  reports  that she has never smoked. She has never used smokeless tobacco. She reports that she does not drink alcohol and does not use drugs.   No Known Allergies  Family History  Problem Relation Age of Onset   Dementia Mother    AAA (abdominal aortic aneurysm) Father    Breast cancer Maternal Aunt    Bladder Cancer Brother    Colon cancer Neg Hx    Stomach cancer Neg Hx    Esophageal cancer Neg Hx    Pancreatic cancer Neg Hx       Prior to  Admission medications   Medication Sig Start Date End Date Taking? Authorizing Provider  Accu-Chek Softclix Lancets lancets USE TO CHECK BLOOD SUGAR TWICE DAILY 05/11/21   Elsie Stain, MD  apixaban (ELIQUIS) 5 MG TABS tablet TAKE ONE TABLET BY MOUTH TWICE DAILY 07/23/22   O'Neal, Cassie Freer, MD  aspirin EC 81 MG tablet Take 1 tablet (81 mg total) by mouth daily. Swallow whole. 02/23/21   O'NealCassie Freer, MD  atorvastatin (LIPITOR) 20 MG tablet Take 1 tablet (20 mg total) by mouth at bedtime. 05/04/20   O'Neal, Cassie Freer, MD  Cyanocobalamin (B-12 COMPLIANCE INJECTION) 1000 MCG/ML KIT Inject 1,000 mcg into the muscle every 30 (thirty) days.    [provider]  DULoxetine (CYMBALTA) 60 MG capsule Take 60 mg by mouth daily.    [provider]  empagliflozin (JARDIANCE) 25 MG TABS tablet Take 25 mg by mouth at bedtime.    [provider]  furosemide (LASIX) 40 MG tablet Take 1 tablet (40 mg total) by mouth daily. 08/24/22 09/23/22  Aline August, MD  glucose blood (ACCU-CHEK AVIVA) test strip Test twice daily 04/04/20   Elsie Stain, MD  isosorbide mononitrate (IMDUR) 30 MG 24 hr tablet Take 1 tablet (30 mg total) by mouth daily. 08/25/22   Aline August, MD  losartan (COZAAR) 25 MG tablet Take 1 tablet (25 mg total) by mouth daily. 04/04/20   Elsie Stain, MD  metFORMIN (GLUCOPHAGE-XR) 750 MG 24 hr tablet Take 750 mg by mouth daily with breakfast. 10/11/20   [provider]  metoprolol tartrate (LOPRESSOR) 25 MG tablet Take 1 tablet (25 mg total) by mouth 2 (two) times daily. 08/24/22   Aline August, MD  NON FORMULARY 1 each by Other route See admin instructions. CPAP machine nightly    [provider]  ondansetron (ZOFRAN) 4 MG tablet Take 1 tablet (4 mg total) by mouth every 8 (eight) hours as needed for nausea or vomiting. 06/21/22   Shuford, Olivia Mackie, PA-C  spironolactone (ALDACTONE) 25 MG tablet Take 0.5 tablets (12.5 mg total) by mouth  daily. 08/25/22   Aline August, MD  tiZANidine (ZANAFLEX) 4 MG tablet Take 1 tablet (4 mg total) by mouth every 8 (eight) hours as needed for muscle spasms. 06/21/22 06/21/23  Shuford, Olivia Mackie, PA-C  traMADol (ULTRAM) 50 MG tablet Take 50-100 mg by mouth every 8 (eight) hours as needed for moderate pain. 02/14/22   [provider]  traZODone (DESYREL) 100 MG tablet Take 150 mg by mouth at bedtime. 04/28/20   [provider]    Physical Exam: BP 126/86   Pulse 72   Temp 97.9 F (36.6 C) (Oral)   Resp 19   Ht 5' (1.524 m)   Wt 77 kg   SpO2 96%   BMI 33.16 kg/m   General: 81 y.o. year-old female well developed well nourished in no acute distress.  Alert and  oriented x3. Cardiovascular: Regular rate and rhythm with no rubs or gallops.  No thyromegaly or JVD noted.  Trace lower extremity edema. 2/4 pulses in all 4 extremities. Respiratory: Clear to auscultation with no wheezes or rales. Good inspiratory effort. Abdomen: Soft nontender nondistended with normal bowel sounds x4 quadrants. Muskuloskeletal: No cyanosis or clubbing.  Trace lower extremity edema bilaterally.   Neuro: CN II-XII intact, strength, sensation, reflexes Skin: No ulcerative lesions noted or rashes Psychiatry: Judgement and insight appear normal. Mood is appropriate for condition and setting          Labs on Admission:  Basic Metabolic Panel: Recent Labs  Lab 09/09/22 2305  NA 140  K 3.2*  CL 104  CO2 24  GLUCOSE 159*  BUN 23  CREATININE 1.45*  CALCIUM 9.4  MG 1.7   Liver Function Tests: Recent Labs  Lab 09/09/22 2305  AST 20  ALT 17  ALKPHOS 114  BILITOT 0.6  PROT 6.0*  ALBUMIN 3.4*   No results for input(s): "LIPASE", "AMYLASE" in the last 168 hours. No results for input(s): "AMMONIA" in the last 168 hours. CBC: Recent Labs  Lab 09/09/22 2305  WBC 10.8*  NEUTROABS 8.2*  HGB 14.4  HCT 43.9  MCV 91.5  PLT 247   Cardiac Enzymes: No results for input(s): "CKTOTAL", "CKMB",  "CKMBINDEX", "TROPONINI" in the last 168 hours.  BNP (last 3 results) Recent Labs    08/21/22 1845 09/09/22 2305  BNP 229.9* 144.7*    ProBNP (last 3 results) Recent Labs    06/15/22 1433  PROBNP 88    CBG: No results for input(s): "GLUCAP" in the last 168 hours.  Radiological Exams on Admission: DG CHEST PORT 1 VIEW  Result Date: 09/09/2022 CLINICAL DATA:  Chest pain EXAM: PORTABLE CHEST 1 VIEW COMPARISON:  08/21/2022 FINDINGS: Postoperative changes in the mediastinum and left shoulder. Shallow inspiration. Cardiac enlargement. Lungs are clear. No pleural effusions. No pneumothorax. Mediastinal contours appear intact. IMPRESSION: Cardiac enlargement.  No evidence of active pulmonary disease. Electronically Signed   By: Lucienne Capers M.D.   On: 09/09/2022 23:10    EKG: I independently viewed the EKG done and my findings are as followed: Sinus rhythm rate of 79.  Nonspecific ST-T changes.  QTc 427  Assessment/Plan Present on Admission:  NSTEMI (non-ST elevated myocardial infarction) ([place])  Principal Problem:   NSTEMI (non-ST elevated myocardial infarction) ([place])  NSTEMI Presented with high-sensitivity troponin, which are uptrending, 713, 1241. Received aspirin in the ED. No evidence of acute ischemia on twelve-lead EKG Seen by cardiology, appreciate assistance. Continue heparin drip as recommended by cardiology. N.p.o., IV fluid hydration, for possible heart catheter today on 09/10/2022.  Coronary artery disease status post CABG Ischemic cardiomyopathy At the time of this visit her chest pain had resolved. Management per cardiology  Hypokalemia Serum potassium 3.2 Repleted orally. Repeat chemistry panel and obtain magnesium level in the morning  Type 2 diabetes with hyperglycemia Serum glucose 159 Last hemoglobin A1c 6.7 on 08/22/2022. Insulin sliding scale  CKD 3B Appears to be at her baseline creatinine 1.45 with GFR 26 Avoid nephrotoxic agents,  dehydration and hypotension.  Obesity BMI 33 Recommend weight loss outpatient with regular physical activity and healthy diet      Critical care time: 65 minutes.     DVT prophylaxis: Heparin drip  Code Status: Full code  Family Communication: None at bedside  Disposition Plan: Progressive care unit  Consults called: Cardiology  Admission status: Inpatient status.   Status is:  Inpatient The patient requires at least 2 midnights for further evaluation and treatment of present condition.   Kayleen Memos MD [facility] Pager [phone]  If 7PM-7AM, please contact night-coverage [email or web address] Password [email or web address]  09/10/2022, 2:36 AM

## 2022-09-10 NOTE — ED Notes (Signed)
Dr Ralene Bathe notified of 702 Troponin level

## 2022-09-10 NOTE — Consult Note (Signed)
Cardiology Consultation:   Patient ID: Judy [name] MRN: [id number]; DOB: 1941-03-25  Admit date: 09/09/2022 Date of Consult: 09/10/2022  Primary Care Provider: Michael Boston, MD [facility] Cardiologist: Evalina Field, MD  [place] Electrophysiologist:  None    Patient Profile:   Judy [name] is a 81 y.o. female with a hx of NSTEMI and CAD s/p CABG, aflutter, diastolic heart failure,DM type II, hyperlipidemia, HTN, bilateral carotid stenosis, OSA, HFpEF, and gout. Being seen today for evaluation of NSTEMI and aflutter with RVR.  History of Present Illness:   Judy [name] is a kind 81 yo female well known to the cardiology service and recently discharged from [facility] presenting with 12 hours of chest pain, found to be in aflutter with RVR.  She was in her normal state of health until when she woke up this morning at 9am, and noted central 9/10 chest pain radiating to the left shoulder with headaches . This pain lasted all day long. She initially thought it was indigestion, but it did not improve with tums. She states she only has been having mild CP since leaving the hospital 2 weeks ago, but this was significantly worse. She lied around all day, but the chest pain only mildly improved. She had no associated n/v/sob. Her CP was worse with any movement. She did not check her pulse throughout the day. Eventually around 9pm, she called EMS.  With EMS, she was found to have a HR of 150 with narrow complex tachycardia. HDS. She was given 11m adenosine, no conversion in rhythm. Repeated with 132mof adenosone, no change in rhythm. Pauses noted with both. [facility]  Past Medical History:  Diagnosis Date   Anxiety    Arthritis    Back pain    Coronary artery disease    Depression    [place]   Dyspnea    Gout    [facility]   [facility]   History of kidney stones    Hyperlipidemia    Hypertension    Leg  edema    [place]   Pneumonia due to COVID-19 virus 12/03/2019   Sleep apnea    [facility]PAP   [facility]   [facility] Rt side weakness    Past Surgical History:  Procedure Laterality Date   BACK SURGERY  2004   BREAST LUMPECTOMY WITH RADIOACTIVE SEED LOCALIZATION Right 10/23/2019   Procedure: RIGHT BREAST LUMPECTOMY WITH RADIOACTIVE SEED LOCALIZATION;  Surgeon: ToJovita KussmaulMD;  Location: [facility] Service: General;  Laterality: Right;   CHOLECYSTECTOMY  2001   cardiac event during surgery   COLONOSCOPY  2013   CORONARY ARTERY BYPASS GRAFT N/A 03/03/2020   Procedure: CORONARY ARTERY BYPASS GRAFTING (CABG), ON PUMP, TIMES FIVE, USING LEFT INTERNAL MAMMARY ARTERY AND ENDOSCOPICALLY HARVESTED BILATERAL GREATER SAPHENOUS VEINS -flow track only;  Surgeon: LiLajuana MatteMD;  [place] [facility] Service: Open Heart Surgery;  Laterality: N/A;   LEFT HEART CATH AND CORONARY ANGIOGRAPHY N/A 03/01/2020   Procedure: LEFT HEART CATH AND CORONARY ANGIOGRAPHY;  Surgeon: SmBelva CromeMD;  [place] [facility];  Service: Cardiovascular;  Laterality: N/A;   LEFT HEART CATH AND CORS/GRAFTS ANGIOGRAPHY N/A 08/23/2022   Procedure: LEFT HEART CATH AND CORS/GRAFTS ANGIOGRAPHY;  Surgeon: JoMartiniquePeter M, MD;  [place]: [facility];  Service: Cardiovascular;  Laterality: N/A;   REVERSE SHOULDER  ARTHROPLASTY Left 06/21/2022   Procedure: REVERSE SHOULDER ARTHROPLASTY;  Surgeon: Justice Britain, MD;  Location: [facility] ORS;  Service: Orthopedics;  Laterality: Left;  156mn   TEE WITHOUT CARDIOVERSION N/A 03/03/2020   Procedure: TRANSESOPHAGEAL ECHOCARDIOGRAM (TEE);  Surgeon: LLajuana Matte MD;  Location: [place]  Service: Open Heart Surgery;  Laterality: N/A;     Home Medications:  Prior to Admission medications   Medication Sig Start Date End Date Taking? Authorizing Provider  Accu-Chek Softclix Lancets lancets USE TO CHECK BLOOD SUGAR TWICE DAILY 05/11/21   WElsie Stain MD  apixaban (ELIQUIS) 5 MG TABS tablet TAKE ONE TABLET BY MOUTH TWICE DAILY 07/23/22   O'Neal, WCassie Freer MD  aspirin EC 81 MG tablet Take 1 tablet (81 mg total) by mouth daily. Swallow whole. 02/23/21   O'Neal,Cassie Freer MD  atorvastatin (LIPITOR) 20 MG tablet Take 1 tablet (20 mg total) by mouth at bedtime. 05/04/20   O'Neal, WCassie Freer MD  Cyanocobalamin (B-12 COMPLIANCE INJECTION) 1000 MCG/ML KIT Inject 1,000 mcg into the muscle every 30 (thirty) days.    [provider]  DULoxetine (CYMBALTA) 60 MG capsule Take 60 mg by mouth daily.    [provider]  empagliflozin (JARDIANCE) 25 MG TABS tablet Take 25 mg by mouth at bedtime.    [provider]  furosemide (LASIX) 40 MG tablet Take 1 tablet (40 mg total) by mouth daily. 08/24/22 09/23/22  AAline August MD  glucose blood (ACCU-CHEK AVIVA) test strip Test twice daily 04/04/20   WElsie Stain MD  isosorbide mononitrate (IMDUR) 30 MG 24 hr tablet Take 1 tablet (30 mg total) by mouth daily. 08/25/22   AAline August MD  losartan (COZAAR) 25 MG tablet Take 1 tablet (25 mg total) by mouth daily. 04/04/20   WElsie Stain MD  metFORMIN (GLUCOPHAGE-XR) 750 MG 24 hr tablet Take 750 mg by mouth daily with breakfast. 10/11/20   [provider]  metoprolol tartrate (LOPRESSOR) 25 MG tablet Take 1 tablet (25 mg total) by mouth 2 (two) times daily. 08/24/22   AAline August MD  NON FORMULARY 1 each by Other route See admin instructions. CPAP machine nightly    [provider]  ondansetron (ZOFRAN) 4 MG tablet Take 1 tablet (4 mg total) by mouth every 8 (eight) hours as needed for nausea or vomiting. 06/21/22   Shuford, TOlivia Mackie PA-C  spironolactone (ALDACTONE) 25 MG tablet Take 0.5 tablets (12.5 mg total) by mouth daily. 08/25/22   AAline August MD  tiZANidine (ZANAFLEX) 4 MG tablet Take 1 tablet (4 mg total) by mouth every 8 (eight) hours as needed for muscle spasms. 06/21/22 06/21/23   Shuford, TOlivia Mackie PA-C  traMADol (ULTRAM) 50 MG tablet Take 50-100 mg by mouth every 8 (eight) hours as needed for moderate pain. 02/14/22   [provider]  traZODone (DESYREL) 100 MG tablet Take 150 mg by mouth at bedtime. 04/28/20   [provider]    Inpatient Medications: Scheduled Meds:  Continuous Infusions:  heparin 900 Units/hr (09/10/22 0047)   PRN Meds:   Allergies:   No Known Allergies  Social History:   Social History   Socioeconomic History   Marital status: Divorced    Spouse name: Not on file   Number of children: 1   Years of education: 12   Highest education level: Some college, no degree  Occupational History   Occupation: CNA  Tobacco Use   Smoking status: Never   Smokeless tobacco: Never  Vaping Use   Vaping Use: Never used  Substance and Sexual Activity   Alcohol use: No    Alcohol/week: 0.0 standard drinks of alcohol   Drug use: No   Sexual activity: Not Currently  Other Topics Concern   Not on file  Social History Narrative   Lives with Roommate   Drinks 32 ounces of caffeine daily   Right Handed   Social Determinants of Health   Financial Resource Strain: Not on file  Food Insecurity: No Food Insecurity (08/22/2022)   Hunger Vital Sign    Worried About Running Out of Food in the Last Year: Never true    Ran Out of Food in the Last Year: Never true  Transportation Needs: No Transportation Needs (08/22/2022)   PRAPARE - Hydrologist (Medical): No    Lack of Transportation (Non-Medical): No  Physical Activity: Not on file  Stress: Not on file  Social Connections: Not on file  Intimate Partner Violence: Not At Risk (08/22/2022)   Humiliation, Afraid, Rape, and Kick questionnaire    Fear of Current or Ex-Partner: No    Emotionally Abused: No    Physically Abused: No    Sexually Abused: No    Family History:   Reviewed in chart updated as below Family History  Problem Relation Age of Onset    Dementia Mother    AAA (abdominal aortic aneurysm) Father    Breast cancer Maternal Aunt    Bladder Cancer Brother    Colon cancer Neg Hx    Stomach cancer Neg Hx    Esophageal cancer Neg Hx    Pancreatic cancer Neg Hx      ROS:  Review of Systems: [y] = yes, '[ ]'  = no      General: Weight gain '[ ]' ; Weight loss '[ ]' ; Anorexia '[ ]' ; Fatigue '[ ]' ; Fever '[ ]' ; Chills '[ ]' ; Weakness '[ ]'    Cardiac: Chest pain/pressure [y]; Resting SOB '[ ]' ; Exertional SOB '[ ]' ; Orthopnea '[ ]' ; Pedal Edema '[ ]' ; Palpitations [y]; Syncope '[ ]' ; Presyncope '[ ]' ; Paroxysmal nocturnal dyspnea '[ ]'    Pulmonary: Cough '[ ]' ; Wheezing '[ ]' ; Hemoptysis '[ ]' ; Sputum '[ ]' ; Snoring '[ ]'    GI: Vomiting '[ ]' ; Dysphagia '[ ]' ; Melena '[ ]' ; Hematochezia '[ ]' ; Heartburn '[ ]' ; Abdominal pain '[ ]' ; Constipation '[ ]' ; Diarrhea '[ ]' ; BRBPR '[ ]'    GU: Hematuria '[ ]' ; Dysuria '[ ]' ; Nocturia '[ ]'  Vascular: Pain in legs with walking '[ ]' ; Pain in feet with lying flat '[ ]' ; Non-healing sores '[ ]' ; Stroke '[ ]' ; TIA '[ ]' ; Slurred speech '[ ]' ;   Neuro: Headaches '[ ]' ; Vertigo '[ ]' ; Seizures '[ ]' ; Paresthesias '[ ]' ;Blurred vision '[ ]' ; Diplopia '[ ]' ; Vision changes '[ ]'    Ortho/Skin: Arthritis '[ ]' ; Joint pain '[ ]' ; Muscle pain '[ ]' ; Joint swelling '[ ]' ; Back Pain '[ ]' ; Rash '[ ]'    Psych: Depression '[ ]' ; Anxiety '[ ]'    Heme: Bleeding problems '[ ]' ; Clotting disorders '[ ]' ; Anemia '[ ]'    Endocrine: Diabetes '[ ]' ; Thyroid dysfunction '[ ]'    Physical Exam/Data:   Vitals:   09/09/22 2226 09/09/22 2245 09/09/22 2315 09/09/22 2345  BP: 132/82 119/70 130/70 122/74  Pulse: 68 71 76 78  Resp: (!) '22 15 16 16  ' Temp: 97.9 F (36.6 C)     TempSrc: Oral     SpO2: 96% 96% 92% 94%  Weight: 77 kg  Height: 5' (1.524 m)      No intake or output data in the 24 hours ending 09/10/22 0141    09/09/2022   10:26 PM 08/23/2022   12:30 AM 08/22/2022    2:51 AM  Last 3 Weights  Weight (lbs) 169 lb 12.8 oz 170 lb 176 lb 14.4 oz  Weight (kg) 77.021 kg 77.111 kg 80.241 kg     Body mass index is 33.16  kg/m.  General:  Well nourished, well developed, in no acute distress, elderly HEENT: normal Lymph: no adenopathy Neck: no JVD Endocrine:  No thryomegaly Vascular: No carotid bruits; FA pulses 2+ bilaterally without bruits  Cardiac:  normal S1, S2; RRR; no murmur Lungs:  clear to auscultation bilaterally, no wheezing, rhonchi or rales  Abd: soft, nontender, no hepatomegaly  Ext: no edema Musculoskeletal:  No deformities, BUE and BLE strength normal and equal Skin: warm and dry  Neuro:  CNs 2-12 intact, no focal abnormalities noted Psych:  Normal affect   EKG:  The EKG was personally reviewed and demonstrates:  aflutter with 2:1 conduction with ventricular rate 150 bpm Telemetry:  Telemetry was personally reviewed and demonstrates:  normal sinus rhythm with ventricular rates in the 90s-100s EMS Strip Review: during adenosine administration, flutter waves are clearly deliniated  Relevant CV Studies:   08/23/2022 cardiac catheterization     Mid LM to Dist LM lesion is 40% stenosed.   Ost LAD to Prox LAD lesion is 45% stenosed.   Mid Cx to Dist Cx lesion is 100% stenosed.   Dist RCA lesion is 100% stenosed.   1st Diag-2 lesion is 85% stenosed.   1st Diag-1 lesion is 40% stenosed.   Mid LAD lesion is 100% stenosed.   Mid Cx lesion is 70% stenosed.   RPAV lesion is 100% stenosed.   Origin lesion before 2nd Mrg  is 100% stenosed.   Origin lesion is 100% stenosed.   Dist Graft lesion is 30% stenosed.   LIMA graft was visualized by angiography and is normal in caliber.   Seq SVG- graft was visualized by angiography.   SVG graft was visualized by angiography.   SVG.   The graft exhibits no disease.   LV end diastolic pressure is severely elevated.   3 vessel obstructive CAD.  Patent LIMA to the LAD Patent SVG to the RCA Occluded SVG to the OMs Occluded SVG to the first diagonal Severely elevated LVEDP   Plan: recommend optimizing antianginal therapy. Patient needs diuresis  with very high EDP. If she has refractory angina despite these measures we could consider PCI of the first diagonal. I don't think the distal LCx is suitable for PCI.    Diagnostic Dominance: Right      Limited Echocardiogram 08/22/2022: Impressions: 1. Left ventricular ejection fraction, by estimation, is 50 to 55%. The  left ventricle has low normal function. The left ventricle has no regional  wall motion abnormalities. Left ventricular diastolic parameters were  normal.   2. Right ventricular systolic function is normal. The right ventricular  size is normal.   3. The mitral valve is normal in structure. Mild mitral valve  regurgitation. No evidence of mitral stenosis.   4. The aortic valve is tricuspid. Aortic valve regurgitation is not  visualized. No aortic stenosis is present.   Comparison(s): Changes from prior study are noted. Low normal LVEF  (50-55%) without focal wall motion abnormalities. Decreased from most  recent EF of 60-65%.  Laboratory Data:  High Sensitivity Troponin:  Recent Labs  Lab 08/21/22 1845 08/21/22 2052 08/22/22 0503 08/22/22 1500 09/09/22 2305  TROPONINIHS 10 94* 1,665* 962* 703*     Chemistry Recent Labs  Lab 09/09/22 2305  NA 140  K 3.2*  CL 104  CO2 24  GLUCOSE 159*  BUN 23  CREATININE 1.45*  CALCIUM 9.4  GFRNONAA 36*  ANIONGAP 12    Recent Labs  Lab 09/09/22 2305  PROT 6.0*  ALBUMIN 3.4*  AST 20  ALT 17  ALKPHOS 114  BILITOT 0.6   Hematology Recent Labs  Lab 09/09/22 2305  WBC 10.8*  RBC 4.80  HGB 14.4  HCT 43.9  MCV 91.5  MCH 30.0  MCHC 32.8  RDW 12.8  PLT 247   BNP Recent Labs  Lab 09/09/22 2305  BNP 144.7*    DDimer No results for input(s): "DDIMER" in the last 168 hours.  Radiology/Studies:  DG CHEST PORT 1 VIEW  Result Date: 09/09/2022 CLINICAL DATA:  Chest pain EXAM: PORTABLE CHEST 1 VIEW COMPARISON:  08/21/2022 FINDINGS: Postoperative changes in the mediastinum and left shoulder. Shallow  inspiration. Cardiac enlargement. Lungs are clear. No pleural effusions. No pneumothorax. Mediastinal contours appear intact. IMPRESSION: Cardiac enlargement.  No evidence of active pulmonary disease. Electronically Signed   By: Lucienne Capers M.D.   On: 09/09/2022 23:10      TIMI Risk Score for Unstable Angina or Non-ST Elevation MI:   The patient's TIMI risk score is 5, which indicates a 26% risk of all cause mortality, new or recurrent myocardial infarction or need for urgent revascularization in the next 14 days.   Assessment and Plan:   81 y.o. female with a hx of NSTEMI and CAD s/p CABG, aflutter, diastolic heart failure,DM type II, hyperlipidemia, HTN, bilateral carotid stenosis, OSA, HFpEF, and gout, currently found to have NSTEMI with chest pain in the setting of uncontrolled aflutter with RVR (2:1)  Aflutter with 2:1 Conduction Type II NSTEMI (less likely Type I NSTEMI) Ischemic Cardiomyopathy w/ CAD s/p CABG Presenting with 12 hours of chest pain at home, found by EMS to be in aflutter with RVR and 2:1 conduction. I suspect she was in aflutter all day causing demand ischemia and chest pain, however do not know if flutter preceded chest pain cause type II NSTEMI or if type I NSTEMI caused chest pain and aflutter with RVR. Leading suspicion is she was in aflutter first leading to demand ischemia. Regardless, she has severe not-easily revascularization CAD, and would benefit from maximizing BB therapy as an anti-anginal and rate control option. Re-attempting cath could also be attempted. -f/u second troponin, if significant elevation from first would heparize to cover for type I NTSEMI -Aspirin daily -admit to medicine -telemetry -NPO in case cath today -May try to uptitrate metoprolol tartrate this admission, also convert to long acting succinate BID to stabilize blood levels and reduce risk of recurrent aflutter -Can be considered for flutter ablation in outpatient setting -Would hold  apixaban, bridge with heparin gtt in case needs cath -If has recurrent flutter, can refer for consideration of ablation      For questions or updates, please contact [place] Please consult [email or web address] for contact info under     Signed, Bronson Curb, MD  09/10/2022 1:41 AM

## 2022-09-10 NOTE — Progress Notes (Signed)
Cardiology Progress Note  Patient ID: ALAISHA EVERSLEY MRN: [id number] DOB: Apr 06, 1941 Date of Encounter: 09/10/2022  Primary Cardiologist: Evalina Field, MD  Subjective   Chief Complaint: Chest pressure  HPI: Overall feeling better.  Maintaining sinus rhythm.  Known history of three-vessel CAD with poor targets for revascularization.  ROS:  All other ROS reviewed and negative. Pertinent positives noted in the HPI.     Inpatient Medications  Scheduled Meds:  amiodarone  200 mg Oral BID   Followed by   Derrill Memo ON 09/14/2022] amiodarone  200 mg Oral Daily   aspirin EC  81 mg Oral Daily   atorvastatin  20 mg Oral QHS   DULoxetine  60 mg Oral Daily   isosorbide mononitrate  30 mg Oral Daily   metoprolol tartrate  12.5 mg Oral BID   potassium chloride  40 mEq Oral BID   Continuous Infusions:  heparin 900 Units/hr (09/10/22 0047)   lactated ringers 50 mL/hr at 09/10/22 0406   PRN Meds:    Vital Signs   Vitals:   09/10/22 0553 09/10/22 0630 09/10/22 0700 09/10/22 0800  BP:  123/63 121/63 114/75  Pulse:  (!) 45 67 (!) 58  Resp:  '17 18 18  '$ Temp: 97.8 F (36.6 C)     TempSrc: Oral     SpO2:  95% 98% 94%  Weight:      Height:       No intake or output data in the 24 hours ending 09/10/22 0830    09/09/2022   10:26 PM 08/23/2022   12:30 AM 08/22/2022    2:51 AM  Last 3 Weights  Weight (lbs) 169 lb 12.8 oz 170 lb 176 lb 14.4 oz  Weight (kg) 77.021 kg 77.111 kg 80.241 kg      Telemetry  Overnight telemetry shows SB 50s, which I personally reviewed.   ECG  The most recent ECG shows SR, nonspecific changes, which I personally reviewed.   Physical Exam   Vitals:   09/10/22 0553 09/10/22 0630 09/10/22 0700 09/10/22 0800  BP:  123/63 121/63 114/75  Pulse:  (!) 45 67 (!) 58  Resp:  '17 18 18  '$ Temp: 97.8 F (36.6 C)     TempSrc: Oral     SpO2:  95% 98% 94%  Weight:      Height:       No intake or output data in the 24 hours ending 09/10/22 0830     09/09/2022    10:26 PM 08/23/2022   12:30 AM 08/22/2022    2:51 AM  Last 3 Weights  Weight (lbs) 169 lb 12.8 oz 170 lb 176 lb 14.4 oz  Weight (kg) 77.021 kg 77.111 kg 80.241 kg    Body mass index is 33.16 kg/m.  TBD minutes were  This General: Well nourished, well developed, in no acute distress Head: Atraumatic, normal size  Eyes: PEERLA, EOMI  Neck: Supple, no JVD Endocrine: No thryomegaly Cardiac: Normal S1, S2; RRR; no murmurs, rubs, or gallops Lungs: Clear to auscultation bilaterally, no wheezing, rhonchi or rales  Abd: Soft, nontender, no hepatomegaly  Ext: No edema, pulses 2+ Musculoskeletal: No deformities, BUE and BLE strength normal and equal Skin: Warm and dry, no rashes   Neuro: Alert and oriented to person, place, time, and situation, CNII-XII grossly intact, no focal deficits  Psych: Normal mood and affect   Labs  High Sensitivity Troponin:   Recent Labs  Lab 08/21/22 2052 08/22/22 0503 08/22/22 1500 09/09/22 2305 09/10/22  0026  TROPONINIHS 94* 1,665* 962* 703* 1,241*     Cardiac EnzymesNo results for input(s): "TROPONINI" in the last 168 hours. No results for input(s): "TROPIPOC" in the last 168 hours.  Chemistry Recent Labs  Lab 09/09/22 2305 09/10/22 0339  NA 140 138  K 3.2* 3.6  CL 104 102  CO2 24 28  GLUCOSE 159* 137*  BUN 23 22  CREATININE 1.45* 1.38*  CALCIUM 9.4 9.2  PROT 6.0*  --   ALBUMIN 3.4*  --   AST 20  --   ALT 17  --   ALKPHOS 114  --   BILITOT 0.6  --   GFRNONAA 36* 39*  ANIONGAP 12 8    Hematology Recent Labs  Lab 09/09/22 2305 09/10/22 0339  WBC 10.8* 10.9*  RBC 4.80 4.77  HGB 14.4 14.2  HCT 43.9 42.9  MCV 91.5 89.9  MCH 30.0 29.8  MCHC 32.8 33.1  RDW 12.8 12.6  PLT 247 235   BNP Recent Labs  Lab 09/09/22 2305  BNP 144.7*    DDimer No results for input(s): "DDIMER" in the last 168 hours.   Radiology  DG CHEST PORT 1 VIEW  Result Date: 09/09/2022 CLINICAL DATA:  Chest pain EXAM: PORTABLE CHEST 1 VIEW COMPARISON:   08/21/2022 FINDINGS: Postoperative changes in the mediastinum and left shoulder. Shallow inspiration. Cardiac enlargement. Lungs are clear. No pleural effusions. No pneumothorax. Mediastinal contours appear intact. IMPRESSION: Cardiac enlargement.  No evidence of active pulmonary disease. Electronically Signed   By: Lucienne Capers M.D.   On: 09/09/2022 23:10    Cardiac Studies  LHC 08/23/2022 3 vessel obstructive CAD.  Patent LIMA to the LAD Patent SVG to the RCA Occluded SVG to the OMs Occluded SVG to the first diagonal Severely elevated LVEDP  TTE 08/22/2022  1. Left ventricular ejection fraction, by estimation, is 50 to 55%. The  left ventricle has low normal function. The left ventricle has no regional  wall motion abnormalities. Left ventricular diastolic parameters were  normal.   2. Right ventricular systolic function is normal. The right ventricular  size is normal.   3. The mitral valve is normal in structure. Mild mitral valve  regurgitation. No evidence of mitral stenosis.   4. The aortic valve is tricuspid. Aortic valve regurgitation is not  visualized. No aortic stenosis is present.   Patient Profile  NELVA HAUK is a 81 y.o. female with CAD status post CABG, atrial fibrillation, HFpEF, diabetes, hypertension, OSA who was admitted on 09/10/2022 with atrial flutter with RVR.  Cardiology consulted for chest pressure and elevated troponin.  Assessment & Plan   #Non-STEMI #Demand ischemia, known three-vessel CAD -Recently underwent left heart catheterization on 08/23/2022.  She is status post CABG.  Patently mid LAD.  Patent vein graft RCA.  Occluded vein graft to OM and occluded vein graft to D1.  No great targets for revascularization on recent heart catheterization. -She was in atrial flutter and has converted back to sinus rhythm.  I suspect this is all demand picture.  She is comfortable.  Still reporting some chest pressure.  We will restart home antianginal therapy.   I would recommend medical therapy. -We will see how she does today.  Continue heparin drip.  As long as she is improving.  Likely restart Eliquis tomorrow.  Could consider PCI to the first diagonal but I would give her a trial of medical therapy. -Continue aspirin and statin therapy.  Continue beta-blocker.  I have added back her  Imdur 30 mg daily.  #Atrial fibrillation/now with flutter -Known history of atrial fibrillation.  She has now developed a flutter.  She was on Eliquis at home.  -We will repeat her echocardiogram.  We will go and start her on amiodarone.  200 mg twice daily for 7 days and then 200 mg daily. -She does have CKD stage IIIb.  She is elderly.  I have always had concerns about memory impairment as she has my personal patient.  I believe amiodarone or Jeanetta run will be a good option for her.  We will start with amiodarone.  Would not want to put her through ablation procedures.  I do not think she would benefit from aggressive antiarrhythmic medications either. -Continue heparin for today.  Likely transition to Eliquis tomorrow.  #HFpEF -Euvolemic on exam.  Restart Lasix 40 mg daily.  Start Aldactone likely tomorrow.    For questions or updates, please contact [place] Please consult [email or web address] for contact info under     Signed, [name], MD, [place]  09/10/2022 8:30 AM

## 2022-09-11 DIAGNOSIS — I214 Non-ST elevation (NSTEMI) myocardial infarction: Secondary | ICD-10-CM | POA: Diagnosis not present

## 2022-09-11 LAB — BASIC METABOLIC PANEL
Anion gap: 8 (ref 5–15)
BUN: 21 mg/dL (ref 8–23)
CO2: 23 mmol/L (ref 22–32)
Calcium: 8.7 mg/dL — ABNORMAL LOW (ref 8.9–10.3)
Chloride: 102 mmol/L (ref 98–111)
Creatinine, Ser: 1.27 mg/dL — ABNORMAL HIGH (ref 0.44–1.00)
GFR, Estimated: 43 mL/min — ABNORMAL LOW (ref >60)
Glucose, Bld: 159 mg/dL — ABNORMAL HIGH (ref 70–99)
Potassium: 4.2 mmol/L (ref 3.5–5.1)
Sodium: 133 mmol/L — ABNORMAL LOW (ref 135–145)

## 2022-09-11 LAB — CBC
HCT: 43 % (ref 36.0–46.0)
Hemoglobin: 14 g/dL (ref 12.0–15.0)
MCH: 30.4 pg (ref 26.0–34.0)
MCHC: 32.6 g/dL (ref 30.0–36.0)
MCV: 93.3 fL (ref 80.0–100.0)
Platelets: 219 10*3/uL (ref 150–400)
RBC: 4.61 MIL/uL (ref 3.87–5.11)
RDW: 12.8 % (ref 11.5–15.5)
WBC: 8.4 10*3/uL (ref 4.0–10.5)
nRBC: 0 % (ref 0.0–0.2)

## 2022-09-11 LAB — MAGNESIUM: Magnesium: 1.9 mg/dL (ref 1.7–2.4)

## 2022-09-11 LAB — APTT: aPTT: 56 seconds — ABNORMAL HIGH (ref 24–36)

## 2022-09-11 LAB — HEPARIN LEVEL (UNFRACTIONATED): Heparin Unfractionated: >1.1 IU/mL — ABNORMAL HIGH (ref 0.30–0.70)

## 2022-09-11 MED ORDER — LOSARTAN POTASSIUM 25 MG PO TABS
25.0000 mg | ORAL_TABLET | Freq: Every day | ORAL | Status: DC
  Administered 2022-09-11: 25 mg via ORAL
  Filled 2022-09-11: qty 1

## 2022-09-11 MED ORDER — FUROSEMIDE 40 MG PO TABS
40.0000 mg | ORAL_TABLET | Freq: Every day | ORAL | Status: DC
  Administered 2022-09-11: 40 mg via ORAL
  Filled 2022-09-11: qty 1

## 2022-09-11 MED ORDER — METOPROLOL TARTRATE 25 MG PO TABS: 12.5000 mg | ORAL_TABLET | Freq: Two times a day (BID) | ORAL | Status: DC

## 2022-09-11 MED ORDER — SPIRONOLACTONE 12.5 MG HALF TABLET
12.5000 mg | ORAL_TABLET | Freq: Every day | ORAL | Status: DC
  Administered 2022-09-11: 12.5 mg via ORAL
  Filled 2022-09-11 (×2): qty 1

## 2022-09-11 MED ORDER — APIXABAN 5 MG PO TABS
5.0000 mg | ORAL_TABLET | Freq: Two times a day (BID) | ORAL | Status: DC
  Administered 2022-09-11: 5 mg via ORAL
  Filled 2022-09-11: qty 1

## 2022-09-11 MED ORDER — AMIODARONE HCL 200 MG PO TABS: ORAL_TABLET | ORAL | 0 refills | Status: DC

## 2022-09-11 NOTE — Progress Notes (Signed)
Cardiology Progress Note  Patient ID: Judy [name] MRN: [id number] DOB: [birth year] Date of Encounter: 09/11/2022  Primary Cardiologist: Evalina Field, MD  Subjective   Chief Complaint: None.  HPI: No further chest pain.  Echo is reassuring.  No recurrence of atrial flutter.  ROS:  All other ROS reviewed and negative. Pertinent positives noted in the HPI.     Inpatient Medications  Scheduled Meds:  amiodarone  200 mg Oral BID   Followed by   Derrill Memo ON 09/14/2022] amiodarone  200 mg Oral Daily   aspirin EC  81 mg Oral Daily   atorvastatin  20 mg Oral QHS   DULoxetine  60 mg Oral Daily   furosemide  40 mg Oral Daily   isosorbide mononitrate  30 mg Oral Daily   losartan  25 mg Oral Daily   metoprolol tartrate  12.5 mg Oral BID   Continuous Infusions:  heparin 1,150 Units/hr (09/11/22 0407)   PRN Meds:    Vital Signs   Vitals:   09/11/22 0626 09/11/22 0627 09/11/22 0634 09/11/22 0635  BP:    (!) 142/70  Pulse: 62 (!) 48  (!) 48  Resp: '20  12 15  '$ Temp:    98.2 F (36.8 C)  TempSrc:      SpO2: 100% 99%  95%  Weight:      Height:        Intake/Output Summary (Last 24 hours) at 09/11/2022 0831 Last data filed at 09/11/2022 0407 Gross per 24 hour  Intake 249.38 ml  Output --  Net 249.38 ml      09/09/2022   10:26 PM 08/23/2022   12:30 AM 08/22/2022    2:51 AM  Last 3 Weights  Weight (lbs) 169 lb 12.8 oz 170 lb 176 lb 14.4 oz  Weight (kg) 77.021 kg 77.111 kg 80.241 kg      Telemetry  Overnight telemetry shows sinus rhythm in the 70s, which I personally reviewed.   Physical Exam   Vitals:   09/11/22 0626 09/11/22 0627 09/11/22 0634 09/11/22 0635  BP:    (!) 142/70  Pulse: 62 (!) 48  (!) 48  Resp: '20  12 15  '$ Temp:    98.2 F (36.8 C)  TempSrc:      SpO2: 100% 99%  95%  Weight:      Height:        Intake/Output Summary (Last 24 hours) at 09/11/2022 0831 Last data filed at 09/11/2022 0407 Gross per 24 hour  Intake 249.38 ml  Output --  Net  249.38 ml       09/09/2022   10:26 PM 08/23/2022   12:30 AM 08/22/2022    2:51 AM  Last 3 Weights  Weight (lbs) 169 lb 12.8 oz 170 lb 176 lb 14.4 oz  Weight (kg) 77.021 kg 77.111 kg 80.241 kg    Body mass index is 33.16 kg/m.  General: Well nourished, well developed, in no acute distress Head: Atraumatic, normal size  Eyes: PEERLA, EOMI  Neck: Supple, no JVD Endocrine: No thryomegaly Cardiac: Normal S1, S2; RRR; no murmurs, rubs, or gallops Lungs: Clear to auscultation bilaterally, no wheezing, rhonchi or rales  Abd: Soft, nontender, no hepatomegaly  Ext: No edema, pulses 2+ Musculoskeletal: No deformities, BUE and BLE strength normal and equal Skin: Warm and dry, no rashes   Neuro: Alert and oriented to person, place, time, and situation, CNII-XII grossly intact, no focal deficits  Psych: Normal mood and affect   Labs  High  Sensitivity Troponin:   Recent Labs  Lab 08/21/22 2052 08/22/22 0503 08/22/22 1500 09/09/22 2305 09/10/22 0026  TROPONINIHS 94* 1,665* 962* 703* 1,241*     Cardiac EnzymesNo results for input(s): "TROPONINI" in the last 168 hours. No results for input(s): "TROPIPOC" in the last 168 hours.  Chemistry Recent Labs  Lab 09/09/22 2305 09/10/22 0339 09/11/22 0313  NA 140 138 133*  K 3.2* 3.6 4.2  CL 104 102 102  CO2 '24 28 23  '$ GLUCOSE 159* 137* 159*  BUN '23 22 21  '$ CREATININE 1.45* 1.38* 1.27*  CALCIUM 9.4 9.2 8.7*  PROT 6.0*  --   --   ALBUMIN 3.4*  --   --   AST 20  --   --   ALT 17  --   --   ALKPHOS 114  --   --   BILITOT 0.6  --   --   GFRNONAA 36* 39* 43*  ANIONGAP '12 8 8    '$ Hematology Recent Labs  Lab 09/09/22 2305 09/10/22 0339 09/11/22 0313  WBC 10.8* 10.9* 8.4  RBC 4.80 4.77 4.61  HGB 14.4 14.2 14.0  HCT 43.9 42.9 43.0  MCV 91.5 89.9 93.3  MCH 30.0 29.8 30.4  MCHC 32.8 33.1 32.6  RDW 12.8 12.6 12.8  PLT 247 235 219   BNP Recent Labs  Lab 09/09/22 2305  BNP 144.7*    DDimer No results for input(s): "DDIMER" in the  last 168 hours.   Radiology  ECHOCARDIOGRAM LIMITED  Result Date: 09/10/2022    ECHOCARDIOGRAM LIMITED REPORT   Patient Name:   Judy [name] [facility] Date of Exam: 09/10/2022 Medical Rec #:  [id number]      Height:       60.0 in Accession #:    [id number]     Weight:       169.8 lb Date of Birth:  [birth year]      BSA:          1.741 m Patient Age:    81 years       BP:           114/75 mmHg Patient Gender: F              HR:           60 bpm. Exam Location:  Inpatient Procedure: Limited Echo, Cardiac Doppler, Color Doppler and Intracardiac            Opacification Agent Indications:    NSTEMI I21.4  History:        Patient has prior history of Echocardiogram examinations, most                 recent 08/22/2022. Cardiomyopathy, NSTEMI and CAD, Prior CABG,                 Stroke, Arrythmias:Atrial Fibrillation and Atrial Flutter,                 Signs/Symptoms:Chest Pain, Dyspnea and Edema; Risk                 Factors:Hypertension, Diabetes, Dyslipidemia, Non-Smoker and                 Sleep Apnea.  Sonographer:    Greer Pickerel Referring Phys: [id number] [place]  Sonographer Comments: Technically difficult study due to poor echo windows, suboptimal parasternal window, suboptimal apical window, patient is obese and no subcostal window. Image acquisition challenging due to respiratory motion. IMPRESSIONS  1. Left  ventricular ejection fraction, by estimation, is 45 to 50%. The left ventricle has mildly decreased function. The left ventricle demonstrates regional wall motion abnormalities (see scoring diagram/findings for description).  2. Left atrial size was mildly dilated.  3. Mild mitral valve regurgitation. Comparison(s): Prior images reviewed side by side. FINDINGS  Left Ventricle: Left ventricular ejection fraction, by estimation, is 45 to 50%. The left ventricle has mildly decreased function. The left ventricle demonstrates regional wall motion abnormalities. Definity contrast agent was given IV to delineate  the left ventricular endocardial borders.  LV Wall Scoring: The mid inferior segment is akinetic. Left Atrium: Left atrial size was mildly dilated. Mitral Valve: Mild mitral valve regurgitation. LEFT VENTRICLE PLAX 2D LVIDd:         4.90 cm LVIDs:         3.90 cm LV PW:         1.10 cm LV IVS:        0.70 cm  LV Volumes (MOD) LV vol d, MOD A2C: 85.2 ml LV vol d, MOD A4C: 41.7 ml LV vol s, MOD A2C: 33.1 ml LV vol s, MOD A4C: 16.4 ml LV SV MOD A2C:     52.1 ml LV SV MOD A4C:     41.7 ml LV SV MOD BP:      42.3 ml LEFT ATRIUM             Index LA diam:        3.70 cm 2.13 cm/m LA Vol (A2C):   36.7 ml 21.08 ml/m LA Vol (A4C):   64.1 ml 36.81 ml/m LA Biplane Vol: 49.7 ml 28.54 ml/m  MITRAL VALVE MV Area (PHT): 3.12 cm MV Decel Time: 243 msec MV E velocity: 75.20 cm/s MV A velocity: 69.10 cm/s MV E/A ratio:  1.09 Candee Furbish MD Electronically signed by Candee Furbish MD Signature Date/Time: 09/10/2022/2:59:33 PM    Final    DG CHEST PORT 1 VIEW  Result Date: 09/09/2022 CLINICAL DATA:  Chest pain EXAM: PORTABLE CHEST 1 VIEW COMPARISON:  08/21/2022 FINDINGS: Postoperative changes in the mediastinum and left shoulder. Shallow inspiration. Cardiac enlargement. Lungs are clear. No pleural effusions. No pneumothorax. Mediastinal contours appear intact. IMPRESSION: Cardiac enlargement.  No evidence of active pulmonary disease. Electronically Signed   By: Lucienne Capers M.D.   On: 09/09/2022 23:10    Cardiac Studies  TTE 09/10/2022  1. Left ventricular ejection fraction, by estimation, is 45 to 50%. The  left ventricle has mildly decreased function. The left ventricle  demonstrates regional wall motion abnormalities (see scoring  diagram/findings for description).   2. Left atrial size was mildly dilated.   3. Mild mitral valve regurgitation.   Patient Profile  Judy [name] is a 81 y.o. female with CAD status post CABG, atrial fibrillation, HFpEF, diabetes, hypertension, OSA who was admitted on 09/10/2022 with  atrial flutter with RVR.  Cardiology consulted for chest pressure and elevated troponin.  Assessment & Plan   #Non-STEMI #Demand ischemia secondary to atrial flutter -Admitted with chest discomfort in setting of atrial flutter.  Symptoms have improved with cessation of the arrhythmia.  Echo shows preserved EF at 50%. -There is mention of akinesis of the mid inferior segment but this appears okay to me. -She has a known occlusion of her vein graft to OM and vein graft to D1.  Her LIMA to LAD was patent.  Her vein graft to the RCA was patent.  She just recently underwent heart catheterization last month. -No  further chest pain symptoms. -Again this appears to be all demand. -I have restarted her home antianginal therapy.  We will continue this.  She is on Imdur 30 mg daily.  She is on metoprolol 12.5 mg twice daily. -She is on aspirin and Eliquis.  I restart her Eliquis today.  Stable for discharge from my standpoint.  #Atrial flutter/fibrillation -History of atrial fibrillation.  Now a flutter.  She has CKD.  She had recurrence of arrhythmia.  We will pursue amiodarone for rhythm control.  She has limited options given her CAD and congestive heart failure.  Continue with amiodarone load.  200 mg twice daily for 7 days and then 200 mg daily. -She will continue Eliquis 5 mg twice daily.  Stop heparin drip.  #HFpEF -Euvolemic on exam.  I restarted her home losartan 25 mg daily, Aldactone 12.5 mg daily and Lasix 40 mg daily.  [place] will sign off.   Medication Recommendations: As above Other recommendations (labs, testing, etc): None Follow up as an outpatient: She has an appointment on 09/26/2022 with Diona Browner who is a nurse practitioner in my office.  She should keep this appointment.  For questions or updates, please contact [place] Please consult [email or web address] for contact info under   Signed, [name], MD, [place]   09/11/2022 8:31 AM

## 2022-09-11 NOTE — Discharge Summary (Signed)
Physician Discharge Summary  Judy [name] OMV:[id number] DOB: 10-12-1941 DOA: 09/09/2022  PCP: Michael Boston, MD  Admit date: 09/09/2022 Discharge date: 09/11/2022  Admitted From: Home Disposition: Home  Recommendations for Outpatient Follow-up:  Follow up with PCP in 1 week with repeat CBC/BMP Outpatient follow-up with cardiology Follow up in ED if symptoms worsen or new appear   Home Health: No Equipment/Devices: None  Discharge Condition: Stable CODE STATUS: Full Diet recommendation: Heart healthy/carb modified/fluid restriction of up to 1500 cc a day  Brief/Interim Summary: 81 y.o. female with medical history significant for recent admission for NSTEMI and A-flutter/A-fib with RVR, ischemic cardiomyopathy, coronary artery disease status post CABG, paroxysmal A-fib/flutter, type 2 diabetes, hypertension, hyperlipidemia, CKD 3B, bilateral carotid stenosis, HFpEF, OSA, gout presented with chest pain.  She was found to have tachycardia and received adenosine x2 by EMS due to concern for SVT.  In the ED, converted back to sinus rhythm.  She had uptrending high-sensitivity troponin.  She was started on heparin drip.  Cardiology was consulted.  Patient was managed conservatively and was started on oral amiodarone.  Subsequently, her chest pain has resolved.  Cardiology has cleared the patient for discharge.  She will be discharged home today with close outpatient follow-up with PCP and cardiology.  Discharge Diagnoses:   Non-STEMI History of coronary disease status post CABG with recent left heart catheterization on 08/23/2022 which had shown no great targets for revascularization Hyperlipidemia Hypertension -Presented with chest pain and uptrending troponin. -Cardiology followed the patient.  Currently chest pain-free.  Cardiology cleared the patient for discharge.  Continue aspirin statin, Imdur and low-dose of metoprolol on discharge -Continue Sartain and Lasix as well  spironolactone  Paroxysmal A-fib/flutter -Received admission x2 via EMS.  Currently on oral amiodarone as per cardiology.  Heart rate mostly controlled with mild bradycardia.  Continue amiodarone 200 mg twice a day for 7 days then 200 mg once a day till reevaluation by cardiology.  Echo showed EF of 45 to 50% -Continue low-dose of oral amiodarone.  Continue Eliquis.  Ischemic cardiomyopathy -Currently compensated.  Continue diet and fluid restriction.  Continue beta-blocker, Lasix, spironolactone.  Continue Jardiance.  Outpatient follow-up with cardiology  Diabetes mellitus type 2 with hyperglycemia -Recent A1c 6.7.  Continue home regimen.  Carb modified diet  CKD stage IIIb Creatinine stable.  Outpatient follow-up  Hyponatremia -Mild.  Outpatient follow-up  Hypokalemia -Resolved  Obesity -Outpatient follow-up  Discharge Instructions  Discharge Instructions     Diet - low sodium heart healthy   Complete by: As directed    Diet Carb Modified   Complete by: As directed    Increase activity slowly   Complete by: As directed       Allergies as of 09/11/2022   No Known Allergies      Medication List     TAKE these medications    Accu-Chek Softclix Lancets lancets USE TO CHECK BLOOD SUGAR TWICE DAILY   amiodarone 200 MG tablet Commonly known as: PACERONE 200 mg twice a day for 7 days then once a day till seen by cardiologist   aspirin EC 81 MG tablet Take 1 tablet (81 mg total) by mouth daily. Swallow whole.   atorvastatin 20 MG tablet Commonly known as: LIPITOR Take 1 tablet (20 mg total) by mouth at bedtime.   B-12 Compliance Injection 1000 MCG/ML Kit Generic drug: Cyanocobalamin Inject 1,000 mcg into the muscle every 30 (thirty) days.   DULoxetine 60 MG capsule Commonly known as: CYMBALTA Take  60 mg by mouth daily.   Eliquis 5 MG Tabs tablet Generic drug: apixaban TAKE ONE TABLET BY MOUTH TWICE DAILY What changed: how much to take   furosemide 40  MG tablet Commonly known as: Lasix Take 1 tablet (40 mg total) by mouth daily.   glucose blood test strip Commonly known as: Accu-Chek Aviva Test twice daily   isosorbide mononitrate 30 MG 24 hr tablet Commonly known as: IMDUR Take 1 tablet (30 mg total) by mouth daily.   Jardiance 25 MG Tabs tablet Generic drug: empagliflozin Take 25 mg by mouth at bedtime.   losartan 25 MG tablet Commonly known as: COZAAR Take 1 tablet (25 mg total) by mouth daily.   metFORMIN 750 MG 24 hr tablet Commonly known as: GLUCOPHAGE-XR Take 750 mg by mouth daily with breakfast.   metoprolol tartrate 25 MG tablet Commonly known as: LOPRESSOR Take 0.5 tablets (12.5 mg total) by mouth 2 (two) times daily. What changed: how much to take   NON FORMULARY 1 each by Other route See admin instructions. CPAP machine nightly   ondansetron 4 MG tablet Commonly known as: Zofran Take 1 tablet (4 mg total) by mouth every 8 (eight) hours as needed for nausea or vomiting.   spironolactone 25 MG tablet Commonly known as: ALDACTONE Take 0.5 tablets (12.5 mg total) by mouth daily.   tiZANidine 4 MG tablet Commonly known as: Zanaflex Take 1 tablet (4 mg total) by mouth every 8 (eight) hours as needed for muscle spasms.   traMADol 50 MG tablet Commonly known as: ULTRAM Take 50-100 mg by mouth every 8 (eight) hours as needed for moderate pain.   traZODone 100 MG tablet Commonly known as: DESYREL Take 150 mg by mouth at bedtime.        Follow-up Information     Michael Boston, MD. Schedule an appointment as soon as possible for a visit in 1 week(s).   Specialty: Internal Medicine Contact information: [place] [address] [phone]         O'Neal, Cassie Freer, MD .   Specialties: Cardiology, Internal Medicine, Radiology Contact information: [address] [place] [address] [phone]                No Known  Allergies  Consultations: Cardiology   Procedures/Studies: ECHOCARDIOGRAM LIMITED  Result Date: 09/10/2022    ECHOCARDIOGRAM LIMITED REPORT   Patient Name:   Judy [name] [facility] Date of Exam: 09/10/2022 Medical Rec #:  [id number]      Height:       60.0 in Accession #:    [id number]     Weight:       169.8 lb Date of Birth:  May 26, 1941      BSA:          1.741 m Patient Age:    32 years       BP:           114/75 mmHg Patient Gender: F              HR:           60 bpm. Exam Location:  Inpatient Procedure: Limited Echo, Cardiac Doppler, Color Doppler and Intracardiac            Opacification Agent Indications:    NSTEMI I21.4  History:        Patient has prior history of Echocardiogram examinations, most  recent 08/22/2022. Cardiomyopathy, NSTEMI and CAD, Prior CABG,                 Stroke, Arrythmias:Atrial Fibrillation and Atrial Flutter,                 Signs/Symptoms:Chest Pain, Dyspnea and Edema; Risk                 Factors:Hypertension, Diabetes, Dyslipidemia, Non-Smoker and                 Sleep Apnea.  Sonographer:    Greer Pickerel Referring Phys: [id number] [place]  Sonographer Comments: Technically difficult study due to poor echo windows, suboptimal parasternal window, suboptimal apical window, patient is obese and no subcostal window. Image acquisition challenging due to respiratory motion. IMPRESSIONS  1. Left ventricular ejection fraction, by estimation, is 45 to 50%. The left ventricle has mildly decreased function. The left ventricle demonstrates regional wall motion abnormalities (see scoring diagram/findings for description).  2. Left atrial size was mildly dilated.  3. Mild mitral valve regurgitation. Comparison(s): Prior images reviewed side by side. FINDINGS  Left Ventricle: Left ventricular ejection fraction, by estimation, is 45 to 50%. The left ventricle has mildly decreased function. The left ventricle demonstrates regional wall motion abnormalities. Definity  contrast agent was given IV to delineate the left ventricular endocardial borders.  LV Wall Scoring: The mid inferior segment is akinetic. Left Atrium: Left atrial size was mildly dilated. Mitral Valve: Mild mitral valve regurgitation. LEFT VENTRICLE PLAX 2D LVIDd:         4.90 cm LVIDs:         3.90 cm LV PW:         1.10 cm LV IVS:        0.70 cm  LV Volumes (MOD) LV vol d, MOD A2C: 85.2 ml LV vol d, MOD A4C: 41.7 ml LV vol s, MOD A2C: 33.1 ml LV vol s, MOD A4C: 16.4 ml LV SV MOD A2C:     52.1 ml LV SV MOD A4C:     41.7 ml LV SV MOD BP:      42.3 ml LEFT ATRIUM             Index LA diam:        3.70 cm 2.13 cm/m LA Vol (A2C):   36.7 ml 21.08 ml/m LA Vol (A4C):   64.1 ml 36.81 ml/m LA Biplane Vol: 49.7 ml 28.54 ml/m  MITRAL VALVE MV Area (PHT): 3.12 cm MV Decel Time: 243 msec MV E velocity: 75.20 cm/s MV A velocity: 69.10 cm/s MV E/A ratio:  1.09 Candee Furbish MD Electronically signed by Candee Furbish MD Signature Date/Time: 09/10/2022/2:59:33 PM    Final    DG CHEST PORT 1 VIEW  Result Date: 09/09/2022 CLINICAL DATA:  Chest pain EXAM: PORTABLE CHEST 1 VIEW COMPARISON:  08/21/2022 FINDINGS: Postoperative changes in the mediastinum and left shoulder. Shallow inspiration. Cardiac enlargement. Lungs are clear. No pleural effusions. No pneumothorax. Mediastinal contours appear intact. IMPRESSION: Cardiac enlargement.  No evidence of active pulmonary disease. Electronically Signed   By: Lucienne Capers M.D.   On: 09/09/2022 23:10   CARDIAC CATHETERIZATION  Result Date: 08/23/2022   Mid LM to Dist LM lesion is 40% stenosed.   Ost LAD to Prox LAD lesion is 45% stenosed.   Mid Cx to Dist Cx lesion is 100% stenosed.   Dist RCA lesion is 100% stenosed.   1st Diag-2 lesion is 85% stenosed.   1st  Diag-1 lesion is 40% stenosed.   Mid LAD lesion is 100% stenosed.   Mid Cx lesion is 70% stenosed.   RPAV lesion is 100% stenosed.   Origin lesion before 2nd Mrg  is 100% stenosed.   Origin lesion is 100% stenosed.   Dist  Graft lesion is 30% stenosed.   LIMA graft was visualized by angiography and is normal in caliber.   Seq SVG- graft was visualized by angiography.   SVG graft was visualized by angiography.   SVG.   The graft exhibits no disease.   LV end diastolic pressure is severely elevated. 3 vessel obstructive CAD. Patent LIMA to the LAD Patent SVG to the RCA Occluded SVG to the OMs Occluded SVG to the first diagonal Severely elevated LVEDP Plan: recommend optimizing antianginal therapy. Patient needs diuresis with very high EDP. If she has refractory angina despite these measures we could consider PCI of the first diagonal. I don't think the distal LCx is suitable for PCI.   ECHOCARDIOGRAM LIMITED  Result Date: 08/22/2022    ECHOCARDIOGRAM REPORT   Patient Name:   Judy [name] [facility] Date of Exam: 08/22/2022 Medical Rec #:  [id number]      Height:       60.0 in Accession #:    [id number]     Weight:       176.9 lb Date of Birth:  1941-08-27      BSA:          1.772 m Patient Age:    60 years       BP:           137/64 mmHg Patient Gender: F              HR:           57 bpm. Exam Location:  Inpatient Procedure: Limited Echo, Cardiac Doppler, Color Doppler and Intracardiac            Opacification Agent Indications:    Atrial Flutter  History:        Patient has prior history of Echocardiogram examinations, most                 recent 06/18/2022. Arrythmias:Atrial Flutter; Risk                 Factors:Hypertension, Dyslipidemia and Diabetes.  Sonographer:    Harvie Junior Referring Phys: [phone] ERIC [name]  Sonographer Comments: Patient is obese. Image acquisition challenging due to patient body habitus. IMPRESSIONS  1. Left ventricular ejection fraction, by estimation, is 50 to 55%. The left ventricle has low normal function. The left ventricle has no regional wall motion abnormalities. Left ventricular diastolic parameters were normal.  2. Right ventricular systolic function is normal. The right ventricular size is normal.  3. The  mitral valve is normal in structure. Mild mitral valve regurgitation. No evidence of mitral stenosis.  4. The aortic valve is tricuspid. Aortic valve regurgitation is not visualized. No aortic stenosis is present. Comparison(s): Changes from prior study are noted. Low normal LVEF (50-55%) without focal wall motion abnormalities. Decreased from most recent EF of 60-65%. FINDINGS  Left Ventricle: Left ventricular ejection fraction, by estimation, is 50 to 55%. The left ventricle has low normal function. The left ventricle has no regional wall motion abnormalities. Definity contrast agent was given IV to delineate the left ventricular endocardial borders. The left ventricular internal cavity size was normal in size. There is no left ventricular hypertrophy. Abnormal (paradoxical) septal motion consistent  with post-operative status. Left ventricular diastolic parameters were normal. Right Ventricle: The right ventricular size is normal. Right vetricular wall thickness was not well visualized. Right ventricular systolic function is normal. Left Atrium: Left atrial size was not well visualized. Right Atrium: Right atrial size was not well visualized. Pericardium: There is no evidence of pericardial effusion. Mitral Valve: The mitral valve is normal in structure. Mild mitral valve regurgitation. No evidence of mitral valve stenosis. Tricuspid Valve: The tricuspid valve is grossly normal. Tricuspid valve regurgitation is trivial. No evidence of tricuspid stenosis. Aortic Valve: The aortic valve is tricuspid. Aortic valve regurgitation is not visualized. No aortic stenosis is present. Aortic valve mean gradient measures 2.0 mmHg. Aortic valve peak gradient measures 3.5 mmHg. Aortic valve area, by VTI measures 2.16 cm. Pulmonic Valve: The pulmonic valve was not well visualized. Pulmonic valve regurgitation is not visualized. Aorta: Aortic root could not be assessed. IAS/Shunts: The interatrial septum was not well visualized.   LEFT VENTRICLE PLAX 2D LVIDd:         4.60 cm      Diastology LVIDs:         3.60 cm      LV e' medial:    7.62 cm/s LV PW:         1.00 cm      LV E/e' medial:  10.7 LV IVS:        1.00 cm      LV e' lateral:   8.16 cm/s LVOT diam:     2.00 cm      LV E/e' lateral: 10.0 LV SV:         49 LV SV Index:   27 LVOT Area:     3.14 cm  LV Volumes (MOD) LV vol d, MOD A2C: 89.2 ml LV vol d, MOD A4C: 107.0 ml LV vol s, MOD A2C: 49.8 ml LV vol s, MOD A4C: 62.5 ml LV SV MOD A2C:     39.4 ml LV SV MOD A4C:     107.0 ml LV SV MOD BP:      43.6 ml LEFT ATRIUM         Index LA diam:    3.40 cm 1.92 cm/m  AORTIC VALVE AV Area (Vmax):    2.02 cm AV Area (Vmean):   2.05 cm AV Area (VTI):     2.16 cm AV Vmax:           93.70 cm/s AV Vmean:          64.200 cm/s AV VTI:            0.225 m AV Peak Grad:      3.5 mmHg AV Mean Grad:      2.0 mmHg LVOT Vmax:         60.30 cm/s LVOT Vmean:        41.900 cm/s LVOT VTI:          0.155 m LVOT/AV VTI ratio: 0.69 MITRAL VALVE MV Area (PHT): 4.60 cm    SHUNTS MV Decel Time: 165 msec    Systemic VTI:  0.16 m MR Peak grad: 74.5 mmHg    Systemic Diam: 2.00 cm MR Vmax:      431.50 cm/s MV E velocity: 81.80 cm/s MV A velocity: 81.20 cm/s MV E/A ratio:  1.01 Buford Dresser MD Electronically signed by Buford Dresser MD Signature Date/Time: 08/22/2022/7:17:17 PM    Final    DG Chest Port 1 View  Result Date: 08/21/2022 CLINICAL  DATA:  [id number]. ED Triage Notes - Pt from home stated that about an hour ago she started experiencing chest pain radiating into the left shoulder and into left jaw. Pt Pt also states that she is SOB and nauseas. Pt has cardiac and stroke hstory EXAM: PORTABLE CHEST 1 VIEW COMPARISON:  Chest x-ray 07/12/2021, CT chest 06/20/2020 FINDINGS: Persistent cardiomegaly. The heart and mediastinal contours are unchanged. Atherosclerotic plaque. Surgical changes overlie the mediastinum. Low lung volumes. Retrocardiac airspace opacity. Chronic coarsened markings with no  overt pulmonary edema. No pleural effusion. No pneumothorax. No acute osseous abnormality. Interval total reverse left shoulder arthroplasty. Right upper quadrant surgical clips. IMPRESSION: 1. Low lung volumes with developing retrocardiac airspace opacity. Followup PA and lateral chest X-ray is recommended in 3-4 weeks following therapy to ensure resolution and exclude underlying malignancy. 2.  Aortic Atherosclerosis (ICD10-I70.0). Electronically Signed   By: Iven Finn M.D.   On: 08/21/2022 19:35      Subjective: Patient seen and examined at bedside.  Denies any current chest pain.  Feels okay to go home today.  No fever, shortness of breath, nausea or vomiting reported.  Discharge Exam: Vitals:   09/11/22 0634 09/11/22 0635  BP:  (!) 142/70  Pulse:  (!) 48  Resp: 12 15  Temp:  98.2 F (36.8 C)  SpO2:  95%    General: Pt is alert, awake, not in acute distress.  On room air. Cardiovascular: Mostly rate controlled, mild intermittent bradycardia present S1/S2 + Respiratory: bilateral decreased breath sounds at bases with some scattered crackles Abdominal: Soft, NT, ND, bowel sounds + Extremities: Trace lower extremity edema present; no cyanosis    The results of significant diagnostics from this hospitalization (including imaging, microbiology, ancillary and laboratory) are listed below for reference.     Microbiology: No results found for this or any previous visit (from the past 240 hour(s)).   Labs: BNP (last 3 results) Recent Labs    08/21/22 1845 09/09/22 2305  BNP 229.9* 863.8*   Basic Metabolic Panel: Recent Labs  Lab 09/09/22 2305 09/10/22 0339 09/11/22 0313  NA 140 138 133*  K 3.2* 3.6 4.2  CL 104 102 102  CO2 _0 GLUCOSE 159* 137* 159*  BUN _1 CREATININE 1.45* 1.38* 1.27*  CALCIUM 9.4 9.2 8.7*  MG 1.7 1.8 1.9  PHOS  --  2.9  --    Liver Function Tests: Recent Labs  Lab 09/09/22 2305  AST 20  ALT 17  ALKPHOS 114  BILITOT 0.6   PROT 6.0*  ALBUMIN 3.4*   No results for input(s): "LIPASE", "AMYLASE" in the last 168 hours. No results for input(s): "AMMONIA" in the last 168 hours. CBC: Recent Labs  Lab 09/09/22 2305 09/10/22 0339 09/11/22 0313  WBC 10.8* 10.9* 8.4  NEUTROABS 8.2*  --   --   HGB 14.4 14.2 14.0  HCT 43.9 42.9 43.0  MCV 91.5 89.9 93.3  PLT 247 235 219   Cardiac Enzymes: No results for input(s): "CKTOTAL", "CKMB", "CKMBINDEX", "TROPONINI" in the last 168 hours. BNP: Invalid input(s): "POCBNP" CBG: No results for input(s): "GLUCAP" in the last 168 hours. D-Dimer No results for input(s): "DDIMER" in the last 72 hours. Hgb A1c No results for input(s): "HGBA1C" in the last 72 hours. Lipid Profile No results for input(s): "CHOL", "HDL", "LDLCALC", "TRIG", "CHOLHDL", "LDLDIRECT" in the last 72 hours. Thyroid function studies No results for input(s): "TSH", "T4TOTAL", "T3FREE", "THYROIDAB" in the last 72 hours.  Invalid  input(s): "FREET3" Anemia work up No results for input(s): "VITAMINB12", "FOLATE", "FERRITIN", "TIBC", "IRON", "RETICCTPCT" in the last 72 hours. Urinalysis    Component Value Date/Time   COLORURINE COLORLESS (A) 08/21/2022 1928   APPEARANCEUR CLEAR 08/21/2022 1928   LABSPEC 1.007 08/21/2022 1928   PHURINE 6.0 08/21/2022 1928   GLUCOSEU >1,000 (A) 08/21/2022 1928   HGBUR NEGATIVE 08/21/2022 1928   BILIRUBINUR NEGATIVE 08/21/2022 1928   KETONESUR NEGATIVE 08/21/2022 1928   PROTEINUR TRACE (A) 08/21/2022 1928   NITRITE NEGATIVE 08/21/2022 1928   LEUKOCYTESUR SMALL (A) 08/21/2022 1928   Sepsis Labs Recent Labs  Lab 09/09/22 2305 09/10/22 0339 09/11/22 0313  WBC 10.8* 10.9* 8.4   Microbiology No results found for this or any previous visit (from the past 240 hour(s)).   Time coordinating discharge: 35 minutes  SIGNED:   Aline August, MD  [facility] 09/11/2022, 10:23 AM

## 2022-09-11 NOTE — Progress Notes (Signed)
Rolling walker has not arrived. Patient provided with night time dose of amiodarone as she said she now has a ride home but not to pharmacy. She will get to the pharmacy fist thing in the morning. ED case manager notified of equipment delay and will leave a voicemail for [employer] in order to get walker delivered to patients house

## 2022-09-11 NOTE — Care Management (Signed)
Received call from Nurse on Unit that patient is still waiting for Rollator. Contacted Zack with [employer], who confirms that the rollator will be delivered to patient's home tomorrow. Patient is agreeable.

## 2022-09-11 NOTE — TOC Transition Note (Addendum)
Transition of Care [facility]) - CM/SW Discharge Note   Patient Details  Name: Judy [name] MRN: [id number] Date of Birth: 09/28/1941  Transition of Care [facility] CM/SW Contact:  Tom-Johnson, Renea Ee, RN Phone Number: 09/11/2022, 11:26 AM   Clinical Narrative:     Patient is scheduled for discharge today. Patient requesting home health aide. Patient states she goes to outpatient PT . CM explained to patient that in order for her to receive home health PT/Aide, she will have to discontinue with outpatient PT. Patient states she wants to continue with outpatient therapy and ok with not receiving the aide by itself as Insurance will not pay for home health aide alone.States she gets more done with outpatient therapy. Patient voiced understanding. MD and RN notified.  Rollator ordered from [employer] and Jodell Cipro to deliver at bedside.  Son's girlfriend, Jeanett Schlein will transport at discharge. No further TOC needs noted.    Final next level of care: Home/Self Care Barriers to Discharge: Barriers Resolved   Patient Goals and CMS Choice Patient states their goals for this hospitalization and ongoing recovery are:: To return home CMS Medicare.gov Compare Post Acute Care list provided to:: Patient Choice offered to / list presented to : Patient  Discharge Placement                Patient to be transferred to facility by: Son's girlfriend, [facility]      Discharge Plan and Services                DME Arranged: Gilford Rile rolling with seat DME Agency: [employer] Date DME Agency Contacted: 09/11/22 Time DME Agency Contacted: [phone]   HH Arranged: NA HH Agency: NA        Social Determinants of Health ([place]) Interventions     Readmission Risk Interventions    03/08/2020   10:09 AM  Readmission Risk Prevention Plan  Transportation Screening Complete  PCP or Specialist Appt within 5-7 Days Complete  Home Care Screening Complete  Medication Review (RN CM) Complete

## 2022-09-11 NOTE — Progress Notes (Signed)
ANTICOAGULATION CONSULT NOTE  Pharmacy Consult for Heparin  Indication: NSTEMI, atrial fib/flutter Brief A/P: aPTT subtherapeutic  Increase Heparin rate  No Known Allergies  Patient Measurements: Height: 5' (152.4 cm) Weight: 77 kg (169 lb 12.8 oz) IBW/kg (Calculated) : 45.5 Heparin dosing weight: 63kg   Vital Signs: Temp: 98.2 F (36.8 C) (10/09 2103) Temp Source: Oral (10/09 2103) BP: 140/127 (10/10 0212) Pulse Rate: 69 (10/10 0212)  Labs: Recent Labs    09/09/22 2305 09/10/22 0026 09/10/22 0339 09/10/22 0920 09/10/22 2109 09/11/22 0313 09/11/22 0314  HGB 14.4  --  14.2  --   --  14.0  --   HCT 43.9  --  42.9  --   --  43.0  --   PLT 247  --  235  --   --  219  --   APTT  --   --   --  49* 72* 56*  --   HEPARINUNFRC  --   --   --  >1.10* >1.10*  --  >1.10*  CREATININE 1.45*  --  1.38*  --   --  1.27*  --   TROPONINIHS 703* 1,241*  --   --   --   --   --      Estimated Creatinine Clearance: 32.4 mL/min (A) (by C-G formula based on SCr of 1.27 mg/dL (H)).  Assessment: 81 y.o. female admitted with chest pain, h/o Afib and Eliquis on hold, for heparin  Goal of Therapy:  Heparin level 0.3-0.7 units/ml aPTT 66-102 seconds Monitor platelets by anticoagulation protocol: Yes   Plan:  Increase Heparin 1150 units/hr  Phillis Knack, PharmD, BCPS  09/11/2022 3:57 AM

## 2022-09-11 NOTE — Progress Notes (Signed)
    Durable Medical Equipment  (From admission, onward)           Start     Ordered   09/11/22 1134  For home use only DME 4 wheeled rolling walker with seat  Once       Question:  Patient needs a walker to treat with the following condition  Answer:  Gait instability   09/11/22 1134

## 2022-09-11 NOTE — Plan of Care (Signed)

## 2022-09-11 NOTE — ED Notes (Signed)
Helped patient out the bed to use the bedside toilet patient has call bell in reach

## 2022-09-14 NOTE — TOC Progression Note (Signed)
Transition of Care [facility]) - Progression Note    Patient Details  Name: [name] MRN: [id number] Date of Birth: 03-13-1941  Transition of Care [facility]) CM/SW Contact  Zenon Mayo, RN Phone Number: 09/14/2022, 2:34 PM  Clinical Narrative:    NCM received a call stating that pateint has not received the walker yet, this NCM contacted Jodell Cipro with [employer] she says she see where it has not been delivered, she states to tell patient it will go out today and she should receive it today or tomorrow. This NCM informed patient of this information.     Barriers to Discharge: Barriers Resolved  Expected Discharge Plan and Services           Expected Discharge Date: 09/11/22               DME Arranged: Gilford Rile rolling with seat DME Agency: [employer] Date DME Agency Contacted: 09/11/22 Time DME Agency Contacted: 1740   HH Arranged: NA HH Agency: NA         Social Determinants of Health (SDOH) Interventions    Readmission Risk Interventions    03/08/2020   10:09 AM  Readmission Risk Prevention Plan  Transportation Screening Complete  PCP or Specialist Appt within 5-7 Days Complete  Home Care Screening Complete  Medication Review (RN CM) Complete

## 2022-09-17 ENCOUNTER — Other Ambulatory Visit: Payer: Self-pay | Admitting: Cardiovascular Disease

## 2022-09-18 ENCOUNTER — Other Ambulatory Visit: Payer: Self-pay | Admitting: Cardiovascular Disease

## 2022-09-20 ENCOUNTER — Ambulatory Visit: Payer: Medicare Other | Admitting: Cardiovascular Disease

## 2022-09-21 ENCOUNTER — Telehealth: Payer: Self-pay | Admitting: Cardiovascular Disease

## 2022-09-21 MED ORDER — METOPROLOL TARTRATE 25 MG PO TABS: 12.5000 mg | ORAL_TABLET | Freq: Two times a day (BID) | ORAL | 2 refills | Status: DC

## 2022-09-21 NOTE — Telephone Encounter (Signed)
*  STAT* If patient is at the pharmacy, call can be transferred to refill team.   1. Which medications need to be refilled? (please list name of each medication and dose if known) metoprolol tartrate (LOPRESSOR) 25 MG tablet  2. Which pharmacy/location (including street and city if local pharmacy) is medication to be sent to? metoprolol tartrate (LOPRESSOR) 25 MG tablet  3. Do they need a 30 day or 90 day supply? 38 Pt's pharmacy is calling to see if this medication can be filled by her cardiologist.

## 2022-09-25 ENCOUNTER — Telehealth: Payer: Self-pay | Admitting: Cardiovascular Disease

## 2022-09-25 NOTE — Telephone Encounter (Signed)
*  STAT* If patient is at the pharmacy, call can be transferred to refill team.   1. Which medications need to be refilled? (please list name of each medication and dose if known) amiodarone (PACERONE) 200 MG tablet; isosorbide mononitrate (IMDUR) 30 MG 24 hr tablet; spironolactone (ALDACTONE) 25 MG tablet  2. Which pharmacy/location (including street and city if local pharmacy) is medication to be sent to?[facility] - [place], [place] - [place] [address]  3. Do they need a 30 day or 90 day supply? [place]

## 2022-09-26 ENCOUNTER — Ambulatory Visit: Payer: Medicare Other | Attending: Nurse Practitioner | Admitting: Nurse Practitioner

## 2022-09-26 ENCOUNTER — Encounter: Payer: Self-pay | Admitting: Nurse Practitioner

## 2022-09-26 VITALS — BP 120/62 | HR 67 | Ht 60.0 in | Wt 173.0 lb

## 2022-09-26 DIAGNOSIS — Z794 Long term (current) use of insulin: Secondary | ICD-10-CM

## 2022-09-26 DIAGNOSIS — I2510 Atherosclerotic heart disease of native coronary artery without angina pectoris: Secondary | ICD-10-CM | POA: Diagnosis not present

## 2022-09-26 DIAGNOSIS — Z8673 Personal history of transient ischemic attack (TIA), and cerebral infarction without residual deficits: Secondary | ICD-10-CM

## 2022-09-26 DIAGNOSIS — I10 Essential (primary) hypertension: Secondary | ICD-10-CM

## 2022-09-26 DIAGNOSIS — I480 Paroxysmal atrial fibrillation: Secondary | ICD-10-CM

## 2022-09-26 DIAGNOSIS — I5032 Chronic diastolic (congestive) heart failure: Secondary | ICD-10-CM

## 2022-09-26 DIAGNOSIS — E1159 Type 2 diabetes mellitus with other circulatory complications: Secondary | ICD-10-CM

## 2022-09-26 DIAGNOSIS — G4733 Obstructive sleep apnea (adult) (pediatric): Secondary | ICD-10-CM

## 2022-09-26 DIAGNOSIS — E785 Hyperlipidemia, unspecified: Secondary | ICD-10-CM

## 2022-09-26 DIAGNOSIS — I6523 Occlusion and stenosis of bilateral carotid arteries: Secondary | ICD-10-CM

## 2022-09-26 MED ORDER — AMIODARONE HCL 200 MG PO TABS: ORAL_TABLET | ORAL | 0 refills | Status: DC

## 2022-09-26 MED ORDER — ISOSORBIDE MONONITRATE ER 30 MG PO TB24: 30.0000 mg | ORAL_TABLET | Freq: Every day | ORAL | 0 refills | Status: DC

## 2022-09-26 MED ORDER — SPIRONOLACTONE 25 MG PO TABS: 12.5000 mg | ORAL_TABLET | Freq: Every day | ORAL | 0 refills | Status: DC

## 2022-09-26 MED ORDER — LOSARTAN POTASSIUM 25 MG PO TABS: 25.0000 mg | ORAL_TABLET | Freq: Every day | ORAL | 0 refills | Status: DC

## 2022-09-26 NOTE — Progress Notes (Signed)
Office Visit    Patient Name: Judy [name] Date of Encounter: 09/26/2022  Primary Care Provider:  Michael Boston, MD Primary Cardiologist:  Evalina Field, MD  Chief Complaint    81 year old female with the above past medical history including CAD s/p CABG x5 in 2021, paroxysmal atrial fibrillation/atrial flutter, chronic diastolic heart failure, hypertension, hyperlipidemia, CVA, carotid artery stenosis, type II diabetes, OSA, anxiety, depression, and gout who presents for hospital follow-up related to atrial fibrillation/atrial flutter.  Past Medical History    Past Medical History:  Diagnosis Date   Anxiety    Arthritis    Back pain    Coronary artery disease    Depression    Diabetes ([place])    Dyspnea    Gout    Heart attack [facility])    March 2021   History of kidney stones    Hyperlipidemia    Hypertension    Leg edema    Pancreatitis, chronic ([place]) 03/2016   Pneumonia due to COVID-19 virus 12/03/2019   Sleep apnea    C-PAP   Stroke [facility])    March 2021 Rt side weakness   Past Surgical History:  Procedure Laterality Date   BACK SURGERY  2004   BREAST LUMPECTOMY WITH RADIOACTIVE SEED LOCALIZATION Right 10/23/2019   Procedure: RIGHT BREAST LUMPECTOMY WITH RADIOACTIVE SEED LOCALIZATION;  Surgeon: Jovita Kussmaul, MD;  Location: [place];  Service: General;  Laterality: Right;   CHOLECYSTECTOMY  2001   cardiac event during surgery   COLONOSCOPY  2013   CORONARY ARTERY BYPASS GRAFT N/A 03/03/2020   Procedure: CORONARY ARTERY BYPASS GRAFTING (CABG), ON PUMP, TIMES FIVE, USING LEFT INTERNAL MAMMARY ARTERY AND ENDOSCOPICALLY HARVESTED BILATERAL GREATER SAPHENOUS VEINS -flow track only;  Surgeon: Lajuana Matte, MD;  Location: [place];  Service: Open Heart Surgery;  Laterality: N/A;   LEFT HEART CATH AND CORONARY ANGIOGRAPHY N/A 03/01/2020   Procedure: LEFT HEART CATH AND CORONARY ANGIOGRAPHY;  Surgeon: Belva Crome, MD;  Location: [place] CV LAB;  Service:  Cardiovascular;  Laterality: N/A;   LEFT HEART CATH AND CORS/GRAFTS ANGIOGRAPHY N/A 08/23/2022   Procedure: LEFT HEART CATH AND CORS/GRAFTS ANGIOGRAPHY;  Surgeon: Martinique, Peter M, MD;  Location: [place] CV LAB;  Service: Cardiovascular;  Laterality: N/A;   REVERSE SHOULDER ARTHROPLASTY Left 06/21/2022   Procedure: REVERSE SHOULDER ARTHROPLASTY;  Surgeon: Justice Britain, MD;  Location: [facility];  Service: Orthopedics;  Laterality: Left;  145mn   TEE WITHOUT CARDIOVERSION N/A 03/03/2020   Procedure: TRANSESOPHAGEAL ECHOCARDIOGRAM (TEE);  Surgeon: LLajuana Matte MD;  Location[place]  Service: Open Heart Surgery;  Laterality: N/A;    Allergies  No Known Allergies  History of Present Illness    81year old female with the above past medical history including CAD s/p CABG x5 in 2021, paroxysmal atrial fibrillation/atrial flutter, chronic diastolic heart failure, hypertension, hyperlipidemia, CVA, carotid artery stenosis, type II diabetes, OSA, anxiety, depression, and gout.   She was hospitalized in March 2021 in the setting of NSTEMI.  Cath showed three-vessel CAD, she underwent CABG x5 (LIMA-LAD, SVG-OM 2, OM 3, D1, PLV) in 03/2020.  She developed atrial fibrillation shortly after surgery.  She was last seen in the office on 06/15/2022 reported progressive shortness of breath, bilateral lower extremity edema.  Echocardiogram at the time showed EF 60 to 65%, normal LV function, no RWMA, mild LVH, indeterminate diastolic parameters, mild mitral valve regurgitation.  She was cleared for shoulder surgery at the time.  She  was hospitalized in September 2023 in the setting of chest pain, shortness of breath.  She was noted to be in atrial fibrillation with RVR.  She converted to normal sinus rhythm on IV Cardizem drip.  Troponin was elevated.  Cardiology was consulted.  She underwent cardiac catheterization on 08/23/2022 which revealed patent LIMA-LAD and SVG-RCA but occluded SVG to OMs and first  diagonal.  LVEDP was severely elevated at 36 mmHg.  Medical therapy was recommended.  Unfortunately, she returned to the ED on 09/09/2022 recurrent atrial flutter with RVR, chest pain. Troponin was elevated, type II NSTEMI.  Cardiology was consulted.  He was started on amiodarone for rhythm control.  Repeat echocardiogram showed EF 50%.  There was mention of akinesis in the mid inferior segment, however, this was reviewed by Dr. Audie Box and was thought to be stable.  Her elevated troponin was thought to have occurred secondary to demand ischemia in the setting of rapid atrial flutter.  Her chest pain resolved with restoration of NSR. She was discharged home in stable condition on 09/11/2022.  She presents today for follow-up.    Since her hospitalization she has been stable from a cardiac standpoint.  She denies any palpitations, chest pain, dyspnea, dizziness, presyncope, syncope, edema, PND, orthopnea, weight gain.  She went on vacation to the beach last weekend was able to rest.  Overall, she reports feeling well.  Home Medications    Current Outpatient Medications  Medication Sig Dispense Refill   Accu-Chek Softclix Lancets lancets USE TO CHECK BLOOD SUGAR TWICE DAILY 102 each 0   apixaban (ELIQUIS) 5 MG TABS tablet TAKE ONE TABLET BY MOUTH TWICE DAILY (Patient taking differently: Take 5 mg by mouth 2 (two) times daily.) 180 tablet 2   aspirin EC 81 MG tablet Take 1 tablet (81 mg total) by mouth daily. Swallow whole. 90 tablet 3   atorvastatin (LIPITOR) 20 MG tablet Take 1 tablet (20 mg total) by mouth at bedtime. 90 tablet 2   Cyanocobalamin (B-12 COMPLIANCE INJECTION) 1000 MCG/ML KIT Inject 1,000 mcg into the muscle every 30 (thirty) days.     DULoxetine (CYMBALTA) 60 MG capsule Take 60 mg by mouth daily.     empagliflozin (JARDIANCE) 25 MG TABS tablet Take 25 mg by mouth at bedtime.     glucose blood (ACCU-CHEK AVIVA) test strip Test twice daily 100 each 2   metFORMIN (GLUCOPHAGE-XR) 750 MG 24  hr tablet Take 750 mg by mouth daily with breakfast.     metoprolol tartrate (LOPRESSOR) 25 MG tablet Take 0.5 tablets (12.5 mg total) by mouth 2 (two) times daily. 45 tablet 2   NON FORMULARY 1 each by Other route See admin instructions. CPAP machine nightly     traZODone (DESYREL) 100 MG tablet Take 150 mg by mouth at bedtime.     amiodarone (PACERONE) 200 MG tablet 200 mg daily 60 tablet 0   furosemide (LASIX) 40 MG tablet Take 1 tablet (40 mg total) by mouth daily. 30 tablet 0   isosorbide mononitrate (IMDUR) 30 MG 24 hr tablet Take 1 tablet (30 mg total) by mouth daily. 30 tablet 0   losartan (COZAAR) 25 MG tablet Take 1 tablet (25 mg total) by mouth daily. 30 tablet 0   spironolactone (ALDACTONE) 25 MG tablet Take 0.5 tablets (12.5 mg total) by mouth daily. 30 tablet 0   No current facility-administered medications for this visit.     Review of Systems    She denies chest pain, palpitations, dyspnea, pnd,  orthopnea, n, v, dizziness, syncope, edema, weight gain, or early satiety. All other systems reviewed and are otherwise negative except as noted above.  Physical Exam    VS:  BP 120/62   Pulse 67   Ht 5' (1.524 m)   Wt 173 lb (78.5 kg)   SpO2 96%   BMI 33.79 kg/m  GEN: Well nourished, well developed, in no acute distress. HEENT: normal. Neck: Supple, no JVD, carotid bruits, or masses. Cardiac: RRR, no murmurs, rubs, or gallops. No clubbing, cyanosis, edema.  Radials/DP/PT 2+ and equal bilaterally.  Respiratory:  Respirations regular and unlabored, clear to auscultation bilaterally. GI: Soft, nontender, nondistended, BS + x 4. MS: no deformity or atrophy. Skin: warm and dry, no rash. Neuro:  Strength and sensation are intact. Psych: Normal affect.  Accessory Clinical Findings    ECG personally reviewed by me today - NSR, 67 bpm - no acute changes.   Lab Results  Component Value Date   WBC 8.4 09/11/2022   HGB 14.0 09/11/2022   HCT 43.0 09/11/2022   MCV 93.3  09/11/2022   PLT 219 09/11/2022   Lab Results  Component Value Date   CREATININE 1.27 (H) 09/11/2022   BUN 21 09/11/2022   NA 133 (L) 09/11/2022   K 4.2 09/11/2022   CL 102 09/11/2022   CO2 23 09/11/2022   Lab Results  Component Value Date   ALT 17 09/09/2022   AST 20 09/09/2022   ALKPHOS 114 09/09/2022   BILITOT 0.6 09/09/2022   Lab Results  Component Value Date   CHOL 111 08/22/2022   HDL 44 08/22/2022   LDLCALC 46 08/22/2022   TRIG 106 08/22/2022   CHOLHDL 2.5 08/22/2022    Lab Results  Component Value Date   HGBA1C 6.7 (H) 08/22/2022    Assessment & Plan   1. Paroxysmal atrial fibrillation/atrial flutter: Two recent hospital admissions with A-fib/flutter with RVR.  She was started on amiodarone.  Maintaining NSR.  Continue amiodarone, metoprolol, Eliquis.  She will likely need LFTs,TSH at next follow-up visit.  2. CAD: S/p CABG x5 in 03/2020. Cath in 08/2022 revealed patent LIMA-LAD and SVG-RCA but occluded SVG to OMs and first diagonal. Medical therapy was recommended.  Troponin was elevated at most recent hospital visit, likely in setting of demand ischemia.  Stable with no anginal symptoms.  Continue aspirin, metoprolol, losartan, Imdur, and Lipitor.  3. Chronic diastolic heart failure: Most recent echo showed EF 50%.  Recent exacerbation in the setting of atrial fibrillation/flutter with RVR.  Now euvolemic and well compensated on exam.  Continue metoprolol, losartan, spironolactone, Jardiance, and Lasix.  Will check CMET today in the setting of new spironolactone and new amiodarone.   4. Hypertension: BP well controlled. Continue current antihypertensive regimen.   5. Hyperlipidemia: LDL was 46 in 08/2022.  Continue aspirin, Lipitor.  6. Carotid artery stenosis: Most recent Dopplers in 2021 showed 1 to 39% B ICA stenosis.  Asymptomatic.  No indication for repeat study at this time.  Continue aspirin, Lipitor.  7. History of CVA: No recurrence.  Continue aspirin,  Eliquis, Lipitor.  8. Type 2 diabetes: A1c in 08/2022 was 6.7.  Monitored and managed per PCP.  9. OSA: Continue CPAP.  10. Disposition: Follow-up as scheduled with Dr. Audie Box in January 2024.      Lenna Sciara, NP 09/26/2022, 6:08 PM

## 2022-09-26 NOTE — Patient Instructions (Signed)
Medication Instructions:  Your physician recommends that you continue on your current medications as directed. Please refer to the Current Medication list given to you today.   *If you need a refill on your cardiac medications before your next appointment, please call your pharmacy*   Lab Work: Your physician recommends that you complete labs today BMET  If you have labs (blood work) drawn today and your tests are completely normal, you will receive your results only by: MyChart Message (if you have MyChart) OR A paper copy in the mail If you have any lab test that is abnormal or we need to change your treatment, we will call you to review the results.   Testing/Procedures: NONE ordered at this time of appointment   Follow-Up: At [facility], you and your health needs are our priority.  As part of our continuing mission to provide you with exceptional heart care, we have created designated Provider Care Teams.  These Care Teams include your primary Cardiologist (physician) and Advanced Practice Providers (APPs -  Physician Assistants and Nurse Practitioners) who all work together to provide you with the care you need, when you need it.  We recommend signing up for the patient portal called "MyChart".  Sign up information is provided on this After Visit Summary.  MyChart is used to connect with patients for Virtual Visits (Telemedicine).  Patients are able to view lab/test results, encounter notes, upcoming appointments, etc.  Non-urgent messages can be sent to your provider as well.   To learn more about what you can do with MyChart, go to [email or web address].    Your next appointment:    Keep follow up   The format for your next appointment:   In Person  Provider:   Evalina Field, MD     Other Instructions   Important Information About Sugar

## 2022-09-26 NOTE — Telephone Encounter (Signed)
Judy [name], can you take a look at this when she comes into the office today 10/25?  Thank you!

## 2022-09-27 ENCOUNTER — Encounter: Payer: Self-pay | Admitting: Cardiovascular Disease

## 2022-09-27 ENCOUNTER — Other Ambulatory Visit: Payer: Self-pay

## 2022-09-27 LAB — COMPREHENSIVE METABOLIC PANEL
ALT: 14 IU/L (ref 0–32)
AST: 15 IU/L (ref 0–40)
Albumin/Globulin Ratio: 2 (ref 1.2–2.2)
Albumin: 4.3 g/dL (ref 3.8–4.8)
Alkaline Phosphatase: 129 IU/L — ABNORMAL HIGH (ref 44–121)
BUN/Creatinine Ratio: 14 (ref 12–28)
BUN: 19 mg/dL (ref 8–27)
Bilirubin Total: <0.2 mg/dL (ref 0.0–1.2)
CO2: 27 mmol/L (ref 20–29)
Calcium: 9.2 mg/dL (ref 8.7–10.3)
Chloride: 100 mmol/L (ref 96–106)
Creatinine, Ser: 1.33 mg/dL — ABNORMAL HIGH (ref 0.57–1.00)
Globulin, Total: 2.2 g/dL (ref 1.5–4.5)
Glucose: 144 mg/dL — ABNORMAL HIGH (ref 70–99)
Potassium: 4.2 mmol/L (ref 3.5–5.2)
Sodium: 144 mmol/L (ref 134–144)
Total Protein: 6.5 g/dL (ref 6.0–8.5)
eGFR: 40 mL/min/{1.73_m2} — ABNORMAL LOW (ref >59)

## 2022-09-27 MED ORDER — FUROSEMIDE 40 MG PO TABS: 40.0000 mg | ORAL_TABLET | Freq: Every day | ORAL | 0 refills | Status: DC

## 2022-09-27 MED ORDER — FUROSEMIDE 40 MG PO TABS: 40.0000 mg | ORAL_TABLET | Freq: Every day | ORAL | 3 refills | Status: DC

## 2022-09-27 NOTE — Telephone Encounter (Signed)
Spoke with pt. Pt is aware that she can continue Lasix 40 mg daily. Refill was sent to [facility] for a 30 day supply and [facility] for a 90 day supply per pts request. Ok per Diona Browner, NP.

## 2022-10-01 ENCOUNTER — Telehealth: Payer: Self-pay

## 2022-10-01 NOTE — Telephone Encounter (Signed)
Spoke with pt. Pt was notified of lab results and recommendations. Pt will continue her current medication and follow up as planned.

## 2022-10-22 ENCOUNTER — Other Ambulatory Visit: Payer: Self-pay | Admitting: Nurse Practitioner

## 2022-11-07 ENCOUNTER — Other Ambulatory Visit: Payer: Self-pay | Admitting: Nurse Practitioner

## 2022-11-21 ENCOUNTER — Other Ambulatory Visit: Payer: Self-pay

## 2022-11-21 ENCOUNTER — Encounter (HOSPITAL_BASED_OUTPATIENT_CLINIC_OR_DEPARTMENT_OTHER): Payer: Self-pay | Admitting: Emergency Medicine

## 2022-11-21 DIAGNOSIS — I2510 Atherosclerotic heart disease of native coronary artery without angina pectoris: Secondary | ICD-10-CM | POA: Diagnosis not present

## 2022-11-21 DIAGNOSIS — W01198A Fall on same level from slipping, tripping and stumbling with subsequent striking against other object, initial encounter: Secondary | ICD-10-CM | POA: Insufficient documentation

## 2022-11-21 DIAGNOSIS — E119 Type 2 diabetes mellitus without complications: Secondary | ICD-10-CM | POA: Diagnosis not present

## 2022-11-21 DIAGNOSIS — I10 Essential (primary) hypertension: Secondary | ICD-10-CM | POA: Diagnosis not present

## 2022-11-21 DIAGNOSIS — Z79899 Other long term (current) drug therapy: Secondary | ICD-10-CM | POA: Diagnosis not present

## 2022-11-21 DIAGNOSIS — Z7982 Long term (current) use of aspirin: Secondary | ICD-10-CM | POA: Diagnosis not present

## 2022-11-21 DIAGNOSIS — Z955 Presence of coronary angioplasty implant and graft: Secondary | ICD-10-CM | POA: Diagnosis not present

## 2022-11-21 DIAGNOSIS — Z7984 Long term (current) use of oral hypoglycemic drugs: Secondary | ICD-10-CM | POA: Insufficient documentation

## 2022-11-21 DIAGNOSIS — S0990XA Unspecified injury of head, initial encounter: Secondary | ICD-10-CM | POA: Insufficient documentation

## 2022-11-21 DIAGNOSIS — Z7901 Long term (current) use of anticoagulants: Secondary | ICD-10-CM | POA: Insufficient documentation

## 2022-11-21 NOTE — ED Triage Notes (Signed)
Pt reports losing balance while bending over, reports hitting her head on a carpeted floor, denies LOC, reports takes Eliquis and asa daily

## 2022-11-22 ENCOUNTER — Emergency Department (HOSPITAL_BASED_OUTPATIENT_CLINIC_OR_DEPARTMENT_OTHER): Payer: Medicare Other

## 2022-11-22 ENCOUNTER — Emergency Department (HOSPITAL_BASED_OUTPATIENT_CLINIC_OR_DEPARTMENT_OTHER)
Admission: EM | Admit: 2022-11-22 | Discharge: 2022-11-22 | Disposition: A | Discharge status: Home / Self Care | Payer: Medicare Other | Attending: Emergency Medicine | Admitting: Emergency Medicine

## 2022-11-22 DIAGNOSIS — S0990XA Unspecified injury of head, initial encounter: Secondary | ICD-10-CM

## 2022-11-22 DIAGNOSIS — W19XXXA Unspecified fall, initial encounter: Secondary | ICD-10-CM

## 2022-11-22 DIAGNOSIS — Z7901 Long term (current) use of anticoagulants: Secondary | ICD-10-CM

## 2022-11-22 LAB — BASIC METABOLIC PANEL
Anion gap: 9 (ref 5–15)
BUN: 25 mg/dL — ABNORMAL HIGH (ref 8–23)
CO2: 29 mmol/L (ref 22–32)
Calcium: 9.4 mg/dL (ref 8.9–10.3)
Chloride: 101 mmol/L (ref 98–111)
Creatinine, Ser: 1.61 mg/dL — ABNORMAL HIGH (ref 0.44–1.00)
GFR, Estimated: 32 mL/min — ABNORMAL LOW (ref >60)
Glucose, Bld: 168 mg/dL — ABNORMAL HIGH (ref 70–99)
Potassium: 3.6 mmol/L (ref 3.5–5.1)
Sodium: 139 mmol/L (ref 135–145)

## 2022-11-22 LAB — CBC WITH DIFFERENTIAL/PLATELET
Abs Immature Granulocytes: 0.04 10*3/uL (ref 0.00–0.07)
Basophils Absolute: 0 10*3/uL (ref 0.0–0.1)
Basophils Relative: 1 %
Eosinophils Absolute: 0.2 10*3/uL (ref 0.0–0.5)
Eosinophils Relative: 3 %
HCT: 42.1 % (ref 36.0–46.0)
Hemoglobin: 13.9 g/dL (ref 12.0–15.0)
Immature Granulocytes: 1 %
Lymphocytes Relative: 23 %
Lymphs Abs: 1.9 10*3/uL (ref 0.7–4.0)
MCH: 30.3 pg (ref 26.0–34.0)
MCHC: 33 g/dL (ref 30.0–36.0)
MCV: 91.7 fL (ref 80.0–100.0)
Monocytes Absolute: 0.5 10*3/uL (ref 0.1–1.0)
Monocytes Relative: 6 %
Neutro Abs: 5.4 10*3/uL (ref 1.7–7.7)
Neutrophils Relative %: 66 %
Platelets: 247 10*3/uL (ref 150–400)
RBC: 4.59 MIL/uL (ref 3.87–5.11)
RDW: 14.2 % (ref 11.5–15.5)
WBC: 8 10*3/uL (ref 4.0–10.5)
nRBC: 0 % (ref 0.0–0.2)

## 2022-11-22 NOTE — ED Provider Notes (Signed)
[facility] EMERGENCY DEPT Provider Note   CSN: [id number] Arrival date & time: 11/21/22  2321     History  Chief Complaint  Patient presents with   Elfreda Blanchet is a 81 y.o. female.  Patient is an 81 year old female with past medical history of prior CVA, coronary artery disease with CABG, type 2 diabetes, hypertension.  Patient presenting with complaints of fall.  She was attempting to put her socks on when she fell forward and landed on the floor.  She struck her forehead and side of her face on the ground.  She denies having lost consciousness.  She denies any neck pain.  She denies any weakness or numbness.  Patient does take Eliquis.  The history is provided by the patient.       Home Medications Prior to Admission medications   Medication Sig Start Date End Date Taking? Authorizing Provider  Accu-Chek Softclix Lancets lancets USE TO CHECK BLOOD SUGAR TWICE DAILY 05/11/21   Elsie Stain, MD  amiodarone (PACERONE) 200 MG tablet 200 mg daily 09/26/22   Lenna Sciara, NP  apixaban (ELIQUIS) 5 MG TABS tablet TAKE ONE TABLET BY MOUTH TWICE DAILY Patient taking differently: Take 5 mg by mouth 2 (two) times daily. 07/23/22   Geralynn Rile, MD  aspirin EC 81 MG tablet Take 1 tablet (81 mg total) by mouth daily. Swallow whole. 02/23/21   O'NealCassie Freer, MD  atorvastatin (LIPITOR) 20 MG tablet Take 1 tablet (20 mg total) by mouth at bedtime. 05/04/20   O'Neal, Cassie Freer, MD  Cyanocobalamin (B-12 COMPLIANCE INJECTION) 1000 MCG/ML KIT Inject 1,000 mcg into the muscle every 30 (thirty) days.    [provider]  DULoxetine (CYMBALTA) 60 MG capsule Take 60 mg by mouth daily.    [provider]  empagliflozin (JARDIANCE) 25 MG TABS tablet Take 25 mg by mouth at bedtime.    [provider]  furosemide (LASIX) 40 MG tablet TAKE 1 TABLET(40 MG) BY MOUTH DAILY 11/08/22   Lenna Sciara, NP  glucose blood (ACCU-CHEK AVIVA)  test strip Test twice daily 04/04/20   Elsie Stain, MD  isosorbide mononitrate (IMDUR) 30 MG 24 hr tablet TAKE 1 TABLET(30 MG) BY MOUTH DAILY 10/23/22   O'Neal, Cassie Freer, MD  losartan (COZAAR) 25 MG tablet Take 1 tablet (25 mg total) by mouth daily. 09/26/22   Lenna Sciara, NP  metFORMIN (GLUCOPHAGE-XR) 750 MG 24 hr tablet Take 750 mg by mouth daily with breakfast. 10/11/20   [provider]  metoprolol tartrate (LOPRESSOR) 25 MG tablet Take 0.5 tablets (12.5 mg total) by mouth 2 (two) times daily. 09/21/22   O'Neal, Cassie Freer, MD  NON FORMULARY 1 each by Other route See admin instructions. CPAP machine nightly    [provider]  spironolactone (ALDACTONE) 25 MG tablet Take 0.5 tablets (12.5 mg total) by mouth daily. 09/26/22   Lenna Sciara, NP  traZODone (DESYREL) 100 MG tablet Take 150 mg by mouth at bedtime. 04/28/20   [provider]  amiodarone (PACERONE) 200 MG tablet 200 mg twice a day for 7 days then once a day till seen by cardiologist Patient taking differently: 200 mg daily. 09/11/22   Aline August, MD      Allergies    Patient has no known allergies.    Review of Systems   Review of Systems  All other systems reviewed and are negative.   Physical Exam Updated Vital  Signs BP 137/73 (BP Location: Left Arm)   Pulse (!) 54   Temp 98 F (36.7 C) (Oral)   Resp 17   Ht 5' (1.524 m)   Wt 77.1 kg   SpO2 91%   BMI 33.20 kg/m  Physical Exam Vitals and nursing note reviewed.  Constitutional:      General: She is not in acute distress.    Appearance: She is well-developed. She is not diaphoretic.  HENT:     Head: Normocephalic and atraumatic.  Eyes:     Extraocular Movements: Extraocular movements intact.     Pupils: Pupils are equal, round, and reactive to light.  Cardiovascular:     Rate and Rhythm: Normal rate and regular rhythm.     Heart sounds: No murmur heard.    No friction rub. No gallop.  Pulmonary:     Effort:  Pulmonary effort is normal. No respiratory distress.     Breath sounds: Normal breath sounds. No wheezing.  Abdominal:     General: Bowel sounds are normal. There is no distension.     Palpations: Abdomen is soft.     Tenderness: There is no abdominal tenderness.  Musculoskeletal:        General: Normal range of motion.     Cervical back: Normal range of motion and neck supple.  Skin:    General: Skin is warm and dry.  Neurological:     General: No focal deficit present.     Mental Status: She is alert and oriented to person, place, and time.     Cranial Nerves: No cranial nerve deficit.     ED Results / Procedures / Treatments   Labs (all labs ordered are listed, but only abnormal results are displayed) Labs Reviewed  BASIC METABOLIC PANEL - Abnormal; Notable for the following components:      Result Value   Glucose, Bld 168 (*)    BUN 25 (*)    Creatinine, Ser 1.61 (*)    GFR, Estimated 32 (*)    All other components within normal limits  CBC WITH DIFFERENTIAL/PLATELET    EKG EKG Interpretation  Date/Time:  Wednesday November 21 2022 23:35:54 EST Ventricular Rate:  62 PR Interval:  190 QRS Duration: 88 QT Interval:  444 QTC Calculation: 450 R Axis:   24 Text Interpretation: Normal sinus rhythm Nonspecific ST and T wave abnormality Abnormal ECG When compared with ECG of 10-Sep-2022 00:19, no significant change is noted Confirmed by Veryl Speak [phone]) on 11/21/2022 11:49:35 PM  Radiology CT Head Wo Contrast  Result Date: 11/22/2022 CLINICAL DATA:  Fall, head trauma, on Eliquis EXAM: CT HEAD WITHOUT CONTRAST TECHNIQUE: Contiguous axial images were obtained from the base of the skull through the vertex without intravenous contrast. RADIATION DOSE REDUCTION: This exam was performed according to the departmental dose-optimization program which includes automated exposure control, adjustment of the mA and/or kV according to patient size and/or use of iterative  reconstruction technique. COMPARISON:  09/27/2019 FINDINGS: Brain: No evidence of acute infarction, hemorrhage, hydrocephalus, extra-axial collection or mass lesion/mass effect. Mild cortical and central atrophy. Subcortical white matter and periventricular small vessel ischemic changes. Vascular: Intracranial atherosclerosis. Skull: Normal. Negative for fracture or focal lesion. Sinuses/Orbits: The visualized paranasal sinuses are essentially clear. The mastoid air cells are unopacified. Other: None. IMPRESSION: No evidence of acute intracranial abnormality. Atrophy with small vessel ischemic changes. Electronically Signed   By: Julian Hy M.D.   On: 11/22/2022 00:30    Procedures Procedures  Medications Ordered in ED Medications - No data to display  ED Course/ Medical Decision Making/ A&P  Patient presenting here after fall as described in the HPI.  She does have a cardiac history and takes Eliquis.  She arrives here with stable vital signs and is neurologically intact.  CBC and basic metabolic panel obtained showing no acute abnormality.  Patient also sent for CT scan of the head which showed no acute intracranial injury or skull fracture.  Patient has been observed for several hours and remains at her baseline.  She is asymptomatic and I feel can safely be discharged.  To return as needed for any problems.  Final Clinical Impression(s) / ED Diagnoses Final diagnoses:  None    Rx / DC Orders ED Discharge Orders     None         Veryl Speak, MD 11/22/22 [phone]

## 2022-11-22 NOTE — Discharge Instructions (Signed)
Return to the emergency department if you develop severe headache, balance difficulties, difficulty waking, or for other new and concerning symptoms.

## 2022-12-13 ENCOUNTER — Other Ambulatory Visit: Payer: Self-pay | Admitting: Internal Medicine

## 2022-12-13 DIAGNOSIS — Z1231 Encounter for screening mammogram for malignant neoplasm of breast: Secondary | ICD-10-CM

## 2022-12-18 ENCOUNTER — Other Ambulatory Visit: Payer: Self-pay | Admitting: Nurse Practitioner

## 2022-12-23 NOTE — Progress Notes (Signed)
Cardiology Office Note:   Date:  12/24/2022  NAME:  Judy [name]    MRN: [id number] DOB:  08-12-1941   PCP:  Michael Boston, MD  Cardiologist:  Evalina Field, MD  Electrophysiologist:  None   Referring MD: Michael Boston, MD   Chief Complaint  Patient presents with   Follow-up        History of Present Illness:   Judy [name] is a 82 y.o. female with a hx of CAD, HFpEF, paroxysmal Afib/flutter, DM, obesity, CVA who presents for follow-up.  She reports she is doing well.  Denies any chest pain or trouble breathing.  She does have some lower extremity edema.  Can get short of breath with exercise.  Euvolemic on examination.  Weights are stable.  All of her labs are at goal.  She remains on amiodarone.  She is working with physical therapy for her balance.  Denies symptoms of angina.  Ejection fraction in the hospital 45-50%.  Medical management has been recommended and I agree with this.  Working on her diabetes.  No significant bleeding.  Normal thyroid and normal chest x-ray.  She is on amiodarone.  Has had an eye exam in December.  Everything seems stable.  Problem List 1. CAD/NSTEMI s/p CABG x 5 -02/2020 with NSTEMI and had 3v CAD -03/03/2020: CABG x 5; LIMA-LAD, SVG to OM2, OM3, D1, PLV (OM3 jump graft off D1) -08/23/2022: patent LIMA-LAD, SVG-RCA, SVG-OM1/2 occluded, SVG-D1 occluded  2. HFmEF -09/2022: EF 45-50% -08/2022: EF 50-55% -06/2022: EF 60-65% 3. HTN 4. DM -A1c 7.5 -T chol 111, HDL 44, LDL 46, TG 106 5. CVA -post cath complication  6. OSA 7. Afib/flutter, paroxysmal  -CHADSVASC= 8 (age, female, CVA, MI, DM, HTN) -developed after CABG 03/11/2020 -spontaneously converted back to NSR 8. Carotid Artery stenosis -1-39% bilatearlly   Past Medical History: Past Medical History:  Diagnosis Date   Anxiety    Arthritis    Back pain    Coronary artery disease    Depression    Diabetes ([place])    Dyspnea    Gout    Heart attack [facility])    March 2021   History of kidney  stones    Hyperlipidemia    Hypertension    Leg edema    Pancreatitis, chronic ([place]) 03/2016   Pneumonia due to COVID-19 virus 12/03/2019   Sleep apnea    C-PAP   Stroke [facility])    March 2021 Rt side weakness    Past Surgical History: Past Surgical History:  Procedure Laterality Date   BACK SURGERY  2004   BREAST LUMPECTOMY WITH RADIOACTIVE SEED LOCALIZATION Right 10/23/2019   Procedure: RIGHT BREAST LUMPECTOMY WITH RADIOACTIVE SEED LOCALIZATION;  Surgeon: Jovita Kussmaul, MD;  Location: [facility];  Service: General;  Laterality: Right;   CHOLECYSTECTOMY  2001   cardiac event during surgery   COLONOSCOPY  2013   CORONARY ARTERY BYPASS GRAFT N/A 03/03/2020   Procedure: CORONARY ARTERY BYPASS GRAFTING (CABG), ON PUMP, TIMES FIVE, USING LEFT INTERNAL MAMMARY ARTERY AND ENDOSCOPICALLY HARVESTED BILATERAL GREATER SAPHENOUS VEINS -flow track only;  Surgeon: Lajuana Matte, MD;  Location: [facility];  Service: Open Heart Surgery;  Laterality: N/A;   LEFT HEART CATH AND CORONARY ANGIOGRAPHY N/A 03/01/2020   Procedure: LEFT HEART CATH AND CORONARY ANGIOGRAPHY;  Surgeon: Belva Crome, MD;  Location: [facility];  Service: Cardiovascular;  Laterality: N/A;   LEFT HEART CATH AND CORS/GRAFTS ANGIOGRAPHY N/A 08/23/2022  Procedure: LEFT HEART CATH AND CORS/GRAFTS ANGIOGRAPHY;  Surgeon: Martinique, Peter M, MD;  Location: [facility];  Service: Cardiovascular;  Laterality: N/A;   REVERSE SHOULDER ARTHROPLASTY Left 06/21/2022   Procedure: REVERSE SHOULDER ARTHROPLASTY;  Surgeon: Justice Britain, MD;  Location: [facility];  Service: Orthopedics;  Laterality: Left;  12mn   TEE WITHOUT CARDIOVERSION N/A 03/03/2020   Procedure: TRANSESOPHAGEAL ECHOCARDIOGRAM (TEE);  Surgeon: LLajuana Matte MD;  [facility] [facility]  Service: Open Heart Surgery;  Laterality: N/A;    Current Medications: Current Meds  Medication Sig   Accu-Chek Softclix Lancets lancets USE TO CHECK BLOOD SUGAR TWICE DAILY   aspirin  EC 81 MG tablet Take 1 tablet (81 mg total) by mouth daily. Swallow whole.   atorvastatin (LIPITOR) 20 MG tablet Take 1 tablet (20 mg total) by mouth at bedtime.   Cyanocobalamin (B-12 COMPLIANCE INJECTION) 1000 MCG/ML KIT Inject 1,000 mcg into the muscle every 30 (thirty) days.   DULoxetine (CYMBALTA) 60 MG capsule Take 60 mg by mouth daily.   empagliflozin (JARDIANCE) 25 MG TABS tablet Take 25 mg by mouth at bedtime.   glucose blood (ACCU-CHEK AVIVA) test strip Test twice daily   metFORMIN (GLUCOPHAGE-XR) 750 MG 24 hr tablet Take 750 mg by mouth daily with breakfast.   NON FORMULARY 1 each by Other route See admin instructions. CPAP machine nightly   spironolactone (ALDACTONE) 25 MG tablet Take 1 tablet (25 mg total) by mouth daily. Please keep scheduled appointment for further refills   traZODone (DESYREL) 100 MG tablet Take 150 mg by mouth at bedtime.   [DISCONTINUED] amiodarone (PACERONE) 200 MG tablet 200 mg daily   [DISCONTINUED] apixaban (ELIQUIS) 5 MG TABS tablet TAKE ONE TABLET BY MOUTH TWICE DAILY (Patient taking differently: Take 5 mg by mouth 2 (two) times daily.)   [DISCONTINUED] furosemide (LASIX) 40 MG tablet TAKE 1 TABLET(40 MG) BY MOUTH DAILY   [DISCONTINUED] isosorbide mononitrate (IMDUR) 30 MG 24 hr tablet TAKE 1 TABLET(30 MG) BY MOUTH DAILY   [DISCONTINUED] losartan (COZAAR) 25 MG tablet Take 1 tablet (25 mg total) by mouth daily.   [DISCONTINUED] metoprolol tartrate (LOPRESSOR) 25 MG tablet Take 0.5 tablets (12.5 mg total) by mouth 2 (two) times daily.     Allergies:    Patient has no known allergies.   Social History: Social History   Socioeconomic History   Marital status: Divorced    Spouse name: Not on file   Number of children: 1   Years of education: 12   Highest education level: Some college, no degree  Occupational History   Occupation: CNA  Tobacco Use   Smoking status: Never   Smokeless tobacco: Never  Vaping Use   Vaping Use: Never used  Substance  and Sexual Activity   Alcohol use: No    Alcohol/week: 0.0 standard drinks of alcohol   Drug use: No   Sexual activity: Not Currently  Other Topics Concern   Not on file  Social History Narrative   Lives with Roommate   Drinks 32 ounces of caffeine daily   Right Handed   Social Determinants of Health   Financial Resource Strain: Not on file  Food Insecurity: No Food Insecurity (08/22/2022)   Hunger Vital Sign    Worried About Running Out of Food in the Last Year: Never true    Ran Out of Food in the Last Year: Never true  Transportation Needs: No Transportation Needs (08/22/2022)   PRAPARE - Transportation    Lack of  Transportation (Medical): No    Lack of Transportation (Non-Medical): No  Physical Activity: Not on file  Stress: Not on file  Social Connections: Not on file     Family History: The patient's family history includes AAA (abdominal aortic aneurysm) in her father; Bladder Cancer in her brother; Breast cancer in her maternal aunt; Dementia in her mother. There is no history of Colon cancer, Stomach cancer, Esophageal cancer, or Pancreatic cancer.  ROS:   All other ROS reviewed and negative. Pertinent positives noted in the HPI.     EKGs/Labs/Other Studies Reviewed:   The following studies were personally reviewed by me today:  Recent Labs: 06/15/2022: NT-Pro BNP 88 08/23/2022: TSH 3.495 09/09/2022: B Natriuretic Peptide 144.7 09/11/2022: Magnesium 1.9 09/26/2022: ALT 14 11/22/2022: BUN 25; Creatinine, Ser 1.61; Hemoglobin 13.9; Platelets 247; Potassium 3.6; Sodium 139   Recent Lipid Panel    Component Value Date/Time   CHOL 111 08/22/2022 0503   CHOL 184 06/19/2018 0958   TRIG 106 08/22/2022 0503   HDL 44 08/22/2022 0503   HDL 45 06/19/2018 0958   CHOLHDL 2.5 08/22/2022 0503   VLDL 21 08/22/2022 0503   LDLCALC 46 08/22/2022 0503   LDLCALC 107 (H) 06/19/2018 0958    Physical Exam:   VS:  BP 122/60   Pulse 63   Ht 5' (1.524 m)   Wt 172 lb 6.4 oz  (78.2 kg)   SpO2 97%   BMI 33.67 kg/m    Wt Readings from Last 3 Encounters:  12/24/22 172 lb 6.4 oz (78.2 kg)  11/21/22 170 lb (77.1 kg)  09/26/22 173 lb (78.5 kg)    General: Well nourished, well developed, in no acute distress Head: Atraumatic, normal size  Eyes: PEERLA, EOMI  Neck: Supple, no JVD Endocrine: No thryomegaly Cardiac: Normal S1, S2; RRR; no murmurs, rubs, or gallops Lungs: Clear to auscultation bilaterally, no wheezing, rhonchi or rales  Abd: Soft, nontender, no hepatomegaly  Ext: Trace edema Musculoskeletal: No deformities, BUE and BLE strength normal and equal Skin: Warm and dry, no rashes   Neuro: Alert and oriented to person, place, time, and situation, CNII-XII grossly intact, no focal deficits  Psych: Normal mood and affect   ASSESSMENT:   Judy [name] is a 82 y.o. female who presents for the following: 1. PAF (paroxysmal atrial fibrillation) ([place])   2. Coronary artery disease involving native coronary artery of native heart without angina pectoris   3. Hyperlipidemia LDL goal <70   4. Chronic diastolic heart failure ([place])   5. Essential hypertension   6. Paroxysmal atrial fibrillation (HCC)     PLAN:   1. PAF (paroxysmal atrial fibrillation) (HCC) -History of recurrent atrial fibrillation/flutter.  Is undergone cardioversion.  Not a good candidate for ablation.  She has multiple comorbidities including memory impairment.  Would recommend to continue amiodarone 200 mg daily.  We can likely reduce this to 100 mg daily in the next few months.   -She is on Eliquis 5 mg twice daily.  Most recent serum creatinine 1.61.  She is over 80.  I would like to recheck this today.  She may need to go to 2.5 mg daily.  We will recheck her kidney function today. -No recurrence.  She has a normal TSH.  Chest x-ray normal.  She sees an eye doctor yearly for diabetes.  2. Coronary artery disease involving native coronary artery of native heart without angina pectoris 3.  Hyperlipidemia LDL goal <70 -Significant CAD with 2 patent grafts.  Other grafts are occluded.  Medical management recommended.  No symptoms of angina today.  On aspirin.  On Lipitor.  LDL is at goal.  4. Chronic diastolic heart failure (HCC) -EF 45-50%.  Euvolemic on exam.  Continue with metoprolol, losartan and Aldactone.  She is on Lasix 40 mg daily.  She is on Jardiance as well.  5. Essential hypertension -Well-controlled today.  Disposition: Return in about 6 months (around 06/24/2023).  Medication Adjustments/Labs and Tests Ordered: Current medicines are reviewed at length with the patient today.  Concerns regarding medicines are outlined above.  Orders Placed This Encounter  Procedures   Basic metabolic panel   Meds ordered this encounter  Medications   isosorbide mononitrate (IMDUR) 30 MG 24 hr tablet    Sig: TAKE 1 TABLET(30 MG) BY MOUTH DAILY    Dispense:  30 tablet    Refill:  6   metoprolol tartrate (LOPRESSOR) 25 MG tablet    Sig: Take 0.5 tablets (12.5 mg total) by mouth 2 (two) times daily.    Dispense:  45 tablet    Refill:  2   losartan (COZAAR) 25 MG tablet    Sig: Take 1 tablet (25 mg total) by mouth daily.    Dispense:  30 tablet    Refill:  3   apixaban (ELIQUIS) 5 MG TABS tablet    Sig: Take 1 tablet (5 mg total) by mouth 2 (two) times daily.    Dispense:  180 tablet    Refill:  2   amiodarone (PACERONE) 200 MG tablet    Sig: 200 mg daily    Dispense:  60 tablet    Refill:  3   furosemide (LASIX) 40 MG tablet    Sig: TAKE 1 TABLET(40 MG) BY MOUTH DAILY    Dispense:  30 tablet    Refill:  3    Patient Instructions  Medication Instructions:  The current medical regimen is effective;  continue present plan and medications.  *If you need a refill on your cardiac medications before your next appointment, please call your pharmacy*  Lab: BMET today   Follow-Up: At [facility], you and your health needs are our priority.  As part of our  continuing mission to provide you with exceptional heart care, we have created designated Provider Care Teams.  These Care Teams include your primary Cardiologist (physician) and Advanced Practice Providers (APPs -  Physician Assistants and Nurse Practitioners) who all work together to provide you with the care you need, when you need it.  We recommend signing up for the patient portal called "MyChart".  Sign up information is provided on this After Visit Summary.  MyChart is used to connect with patients for Virtual Visits (Telemedicine).  Patients are able to view lab/test results, encounter notes, upcoming appointments, etc.  Non-urgent messages can be sent to your provider as well.   To learn more about what you can do with MyChart, go to [email or web address].    Your next appointment:   6 month(s)  Provider:   Sande Rives, PA-C, Almyra Deforest, PA-C, or Diona Browner, NP       Time Spent with Patient: I have spent a total of 25 minutes with patient reviewing hospital notes, telemetry, EKGs, labs and examining the patient as well as establishing an assessment and plan that was discussed with the patient.  > 50% of time was spent in direct patient care.  Signed, Addison Naegeli. Audie Box, MD, [place]  [address], [place], [place] [address] [phone]  12/24/2022 8:21 AM

## 2022-12-24 ENCOUNTER — Ambulatory Visit: Payer: Medicare Other | Attending: Cardiovascular Disease | Admitting: Cardiovascular Disease

## 2022-12-24 ENCOUNTER — Encounter: Payer: Self-pay | Admitting: Cardiovascular Disease

## 2022-12-24 VITALS — BP 122/60 | HR 63 | Ht 60.0 in | Wt 172.4 lb

## 2022-12-24 DIAGNOSIS — E785 Hyperlipidemia, unspecified: Secondary | ICD-10-CM

## 2022-12-24 DIAGNOSIS — I10 Essential (primary) hypertension: Secondary | ICD-10-CM

## 2022-12-24 DIAGNOSIS — I2510 Atherosclerotic heart disease of native coronary artery without angina pectoris: Secondary | ICD-10-CM

## 2022-12-24 DIAGNOSIS — I480 Paroxysmal atrial fibrillation: Secondary | ICD-10-CM | POA: Diagnosis not present

## 2022-12-24 DIAGNOSIS — I5032 Chronic diastolic (congestive) heart failure: Secondary | ICD-10-CM | POA: Diagnosis not present

## 2022-12-24 LAB — BASIC METABOLIC PANEL
BUN/Creatinine Ratio: 16 (ref 12–28)
BUN: 23 mg/dL (ref 8–27)
CO2: 27 mmol/L (ref 20–29)
Calcium: 9.2 mg/dL (ref 8.7–10.3)
Chloride: 101 mmol/L (ref 96–106)
Creatinine, Ser: 1.46 mg/dL — ABNORMAL HIGH (ref 0.57–1.00)
Glucose: 155 mg/dL — ABNORMAL HIGH (ref 70–99)
Potassium: 4.1 mmol/L (ref 3.5–5.2)
Sodium: 143 mmol/L (ref 134–144)
eGFR: 36 mL/min/{1.73_m2} — ABNORMAL LOW (ref >59)

## 2022-12-24 MED ORDER — ISOSORBIDE MONONITRATE ER 30 MG PO TB24: ORAL_TABLET | ORAL | 6 refills | Status: DC

## 2022-12-24 MED ORDER — METOPROLOL TARTRATE 25 MG PO TABS: 12.5000 mg | ORAL_TABLET | Freq: Two times a day (BID) | ORAL | 2 refills | Status: DC

## 2022-12-24 MED ORDER — AMIODARONE HCL 200 MG PO TABS: ORAL_TABLET | ORAL | 3 refills | Status: DC

## 2022-12-24 MED ORDER — LOSARTAN POTASSIUM 25 MG PO TABS: 25.0000 mg | ORAL_TABLET | Freq: Every day | ORAL | 3 refills | Status: DC

## 2022-12-24 MED ORDER — FUROSEMIDE 40 MG PO TABS: ORAL_TABLET | ORAL | 3 refills | Status: DC

## 2022-12-24 MED ORDER — APIXABAN 5 MG PO TABS: 5.0000 mg | ORAL_TABLET | Freq: Two times a day (BID) | ORAL | 2 refills | Status: DC

## 2022-12-24 NOTE — Patient Instructions (Addendum)
Medication Instructions:  The current medical regimen is effective;  continue present plan and medications.  *If you need a refill on your cardiac medications before your next appointment, please call your pharmacy*  Lab: BMET today   Follow-Up: At [facility], you and your health needs are our priority.  As part of our continuing mission to provide you with exceptional heart care, we have created designated Provider Care Teams.  These Care Teams include your primary Cardiologist (physician) and Advanced Practice Providers (APPs -  Physician Assistants and Nurse Practitioners) who all work together to provide you with the care you need, when you need it.  We recommend signing up for the patient portal called "MyChart".  Sign up information is provided on this After Visit Summary.  MyChart is used to connect with patients for Virtual Visits (Telemedicine).  Patients are able to view lab/test results, encounter notes, upcoming appointments, etc.  Non-urgent messages can be sent to your provider as well.   To learn more about what you can do with MyChart, go to [email or web address].    Your next appointment:   6 month(s)  Provider:   Sande Rives, PA-C, Almyra Deforest, PA-C, or Diona Browner, NP

## 2023-02-04 ENCOUNTER — Ambulatory Visit
Admission: RE | Admit: 2023-02-04 | Discharge: 2023-02-04 | Disposition: A | Discharge status: Home / Self Care | Payer: Medicare Other | Source: Ambulatory Visit | Attending: Internal Medicine | Admitting: Internal Medicine

## 2023-02-04 DIAGNOSIS — Z1231 Encounter for screening mammogram for malignant neoplasm of breast: Secondary | ICD-10-CM

## 2023-02-06 ENCOUNTER — Other Ambulatory Visit: Payer: Self-pay | Admitting: Cardiovascular Disease

## 2023-02-07 ENCOUNTER — Other Ambulatory Visit: Payer: Self-pay | Admitting: Internal Medicine

## 2023-02-07 DIAGNOSIS — R928 Other abnormal and inconclusive findings on diagnostic imaging of breast: Secondary | ICD-10-CM

## 2023-02-21 ENCOUNTER — Other Ambulatory Visit: Payer: Self-pay | Admitting: Internal Medicine

## 2023-02-21 ENCOUNTER — Ambulatory Visit
Admission: RE | Admit: 2023-02-21 | Discharge: 2023-02-21 | Disposition: A | Discharge status: Home / Self Care | Payer: Medicare Other | Source: Ambulatory Visit | Attending: Internal Medicine | Admitting: Internal Medicine

## 2023-02-21 DIAGNOSIS — R928 Other abnormal and inconclusive findings on diagnostic imaging of breast: Secondary | ICD-10-CM

## 2023-02-21 DIAGNOSIS — R921 Mammographic calcification found on diagnostic imaging of breast: Secondary | ICD-10-CM

## 2023-03-07 ENCOUNTER — Ambulatory Visit
Admission: RE | Admit: 2023-03-07 | Discharge: 2023-03-07 | Disposition: A | Discharge status: Home / Self Care | Payer: Medicare Other | Source: Ambulatory Visit | Attending: Internal Medicine | Admitting: Internal Medicine

## 2023-03-07 ENCOUNTER — Other Ambulatory Visit: Payer: Self-pay | Admitting: Internal Medicine

## 2023-03-07 DIAGNOSIS — R928 Other abnormal and inconclusive findings on diagnostic imaging of breast: Secondary | ICD-10-CM

## 2023-03-07 DIAGNOSIS — R921 Mammographic calcification found on diagnostic imaging of breast: Secondary | ICD-10-CM

## 2023-05-15 ENCOUNTER — Other Ambulatory Visit: Payer: Self-pay | Admitting: Cardiovascular Disease

## 2023-05-22 ENCOUNTER — Other Ambulatory Visit: Payer: Self-pay | Admitting: Internal Medicine

## 2023-05-22 DIAGNOSIS — R921 Mammographic calcification found on diagnostic imaging of breast: Secondary | ICD-10-CM

## 2023-05-30 ENCOUNTER — Ambulatory Visit
Admission: RE | Admit: 2023-05-30 | Discharge: 2023-05-30 | Disposition: A | Discharge status: Home / Self Care | Payer: Medicare Other | Source: Ambulatory Visit | Attending: Internal Medicine | Admitting: Internal Medicine

## 2023-05-30 DIAGNOSIS — R921 Mammographic calcification found on diagnostic imaging of breast: Secondary | ICD-10-CM

## 2023-06-17 ENCOUNTER — Other Ambulatory Visit: Payer: Self-pay | Admitting: Internal Medicine

## 2023-06-17 DIAGNOSIS — R921 Mammographic calcification found on diagnostic imaging of breast: Secondary | ICD-10-CM

## 2023-06-27 ENCOUNTER — Ambulatory Visit: Payer: Medicare Other | Attending: Nurse Practitioner | Admitting: Nurse Practitioner

## 2023-06-27 ENCOUNTER — Encounter: Payer: Self-pay | Admitting: Nurse Practitioner

## 2023-06-27 VITALS — BP 110/66 | HR 67 | Ht 60.0 in | Wt 171.4 lb

## 2023-06-27 DIAGNOSIS — E1159 Type 2 diabetes mellitus with other circulatory complications: Secondary | ICD-10-CM

## 2023-06-27 DIAGNOSIS — I10 Essential (primary) hypertension: Secondary | ICD-10-CM

## 2023-06-27 DIAGNOSIS — E785 Hyperlipidemia, unspecified: Secondary | ICD-10-CM

## 2023-06-27 DIAGNOSIS — I5022 Chronic systolic (congestive) heart failure: Secondary | ICD-10-CM

## 2023-06-27 DIAGNOSIS — I480 Paroxysmal atrial fibrillation: Secondary | ICD-10-CM | POA: Diagnosis not present

## 2023-06-27 DIAGNOSIS — I2510 Atherosclerotic heart disease of native coronary artery without angina pectoris: Secondary | ICD-10-CM | POA: Diagnosis not present

## 2023-06-27 DIAGNOSIS — G4733 Obstructive sleep apnea (adult) (pediatric): Secondary | ICD-10-CM

## 2023-06-27 DIAGNOSIS — I6523 Occlusion and stenosis of bilateral carotid arteries: Secondary | ICD-10-CM

## 2023-06-27 DIAGNOSIS — Z8673 Personal history of transient ischemic attack (TIA), and cerebral infarction without residual deficits: Secondary | ICD-10-CM

## 2023-06-27 DIAGNOSIS — Z794 Long term (current) use of insulin: Secondary | ICD-10-CM

## 2023-06-27 NOTE — Progress Notes (Signed)
Office Visit    Patient Name: Judy [name] Date of Encounter: 06/27/2023  Primary Care Provider:  Melida Quitter, MD Primary Cardiologist:  Reatha Harps, MD  Chief Complaint    82 year old female with the above past medical history including CAD s/p CABG x5 in 2021, paroxysmal atrial fibrillation/atrial flutter, HFmrEF, hypertension, hyperlipidemia, CVA, carotid artery stenosis, type II diabetes, OSA, anxiety, depression, and gout who presents for follow-up related to CAD, heart failure, and atrial fibrillation/atrial flutter.   Past Medical History    Past Medical History:  Diagnosis Date   Anxiety    Arthritis    Back pain    Coronary artery disease    Depression    Diabetes (HCC)    Dyspnea    Gout    Heart attack [facility])    March 2021   History of kidney stones    Hyperlipidemia    Hypertension    Leg edema    Pancreatitis, chronic (HCC) 03/2016   Pneumonia due to COVID-19 virus 12/03/2019   Sleep apnea    C-PAP   Stroke [facility])    March 2021 Rt side weakness   Past Surgical History:  Procedure Laterality Date   BACK SURGERY  2004   BREAST LUMPECTOMY WITH RADIOACTIVE SEED LOCALIZATION Right 10/23/2019   Procedure: RIGHT BREAST LUMPECTOMY WITH RADIOACTIVE SEED LOCALIZATION;  Surgeon: Griselda Miner, MD;  Location: [facility] OR;  Service: General;  Laterality: Right;   CHOLECYSTECTOMY  2001   cardiac event during surgery   COLONOSCOPY  2013   CORONARY ARTERY BYPASS GRAFT N/A 03/03/2020   Procedure: CORONARY ARTERY BYPASS GRAFTING (CABG), ON PUMP, TIMES FIVE, USING LEFT INTERNAL MAMMARY ARTERY AND ENDOSCOPICALLY HARVESTED BILATERAL GREATER SAPHENOUS VEINS -flow track only;  Surgeon: Corliss Skains, MD;  Location: [facility] OR;  Service: Open Heart Surgery;  Laterality: N/A;   LEFT HEART CATH AND CORONARY ANGIOGRAPHY N/A 03/01/2020   Procedure: LEFT HEART CATH AND CORONARY ANGIOGRAPHY;  Surgeon: Lyn Records, MD;  Location: [facility] INVASIVE CV LAB;  Service: Cardiovascular;   Laterality: N/A;   LEFT HEART CATH AND CORS/GRAFTS ANGIOGRAPHY N/A 08/23/2022   Procedure: LEFT HEART CATH AND CORS/GRAFTS ANGIOGRAPHY;  Surgeon: Swazil[place], Peter M, MD;  Location: [facility] INVASIVE CV LAB;  Service: Cardiovascular;  Laterality: N/A;   REVERSE SHOULDER ARTHROPLASTY Left 06/21/2022   Procedure: REVERSE SHOULDER ARTHROPLASTY;  Surgeon: Francena Hanly, MD;  Location: [facility] ORS;  Service: Orthopedics;  Laterality: Left;    TEE WITHOUT CARDIOVERSION N/A 03/03/2020   Procedure: TRANSESOPHAGEAL ECHOCARDIOGRAM (TEE);  Surgeon: Corliss Skains, MD;  Location: [facility] OR;  Service: Open Heart Surgery;  Laterality: N/A;    Allergies  No Known Allergies   Labs/Other Studies Reviewed    The following studies were reviewed today:  Cardiac Studies & Procedures   CARDIAC CATHETERIZATION  CARDIAC CATHETERIZATION 08/23/2022  Narrative   Mid LM to Dist LM lesion is 40% stenosed.   Ost LAD to Prox LAD lesion is 45% stenosed.   Mid Cx to Dist Cx lesion is 100% stenosed.   Dist RCA lesion is 100% stenosed.   1st Diag-2 lesion is 85% stenosed.   1st Diag-1 lesion is 40% stenosed.   Mid LAD lesion is 100% stenosed.   Mid Cx lesion is 70% stenosed.   RPAV lesion is 100% stenosed.   Origin lesion before 2nd Mrg  is 100% stenosed.   Origin lesion is 100% stenosed.   Dist Graft lesion is 30% stenosed.   LIMA  graft was visualized by angiography and is normal in caliber.   Seq SVG- graft was visualized by angiography.   SVG graft was visualized by angiography.   SVG.   The graft exhibits no disease.   LV end diastolic pressure is severely elevated.  3 vessel obstructive CAD. Patent LIMA to the LAD Patent SVG to the RCA Occluded SVG to the OMs Occluded SVG to the first diagonal Severely elevated LVEDP  Plan: recommend optimizing antianginal therapy. Patient needs diuresis with very high EDP. If she has refractory angina despite these measures we could consider PCI of the first diagonal. I  don't think the distal LCx is suitable for PCI.  Findings Coronary Findings Diagnostic  Dominance: Right  Left Main Mid LM to Dist LM lesion is 40% stenosed.  Left Anterior Descending Ost LAD to Prox LAD lesion is 45% stenosed. Mid LAD lesion is 100% stenosed.  First Diagonal Branch 1st Diag-1 lesion is 40% stenosed. 1st Diag-2 lesion is 85% stenosed.  Left Anterior Descending Ost LAD to Prox LAD lesion is 45% stenosed. Mid LAD lesion is 100% stenosed.  First Diagonal Branch 1st Diag-1 lesion is 40% stenosed. 1st Diag-2 lesion is 85% stenosed.  Left Circumflex Mid Cx lesion is 70% stenosed. Mid Cx to Dist Cx lesion is 100% stenosed.  First Obtuse Marginal Branch Vessel is small in size.  Second Obtuse Marginal Branch Vessel is large in size.  Right Coronary Artery There is moderate diffuse disease throughout the vessel. Dist RCA lesion is 100% stenosed.  Right Posterior Descending Artery  Right Posterior Atrioventricular Artery RPAV lesion is 100% stenosed.  LIMA LIMA Graft To Dist LAD LIMA graft was visualized by angiography and is normal in caliber.  The graft exhibits no disease.  Sequential Jump Graft Graft To 2nd Mrg, 3rd Mrg Seq SVG- graft was visualized by angiography. Origin lesion before 2nd Mrg  is 100% stenosed.  Saphenous Graft To 1st Diag SVG graft was visualized by angiography. Origin lesion is 100% stenosed.  Saphenous Graft To 1st RPL SVG. Dist Graft lesion is 30% stenosed.  Intervention  No interventions have been documented.   CARDIAC CATHETERIZATION  CARDIAC CATHETERIZATION 03/01/2020  Narrative  Severe multivessel coronary disease  Segmental proximal to mid 90% LAD stenosis.  Distal flow is TIMI grade II.  Right coronary supplies LAD via septal perforator collaterals.  90% mid to distal LAD before the second obtuse marginal.  100% circumflex after the second obtuse marginal.  Total occlusion of distal RCA.  Left  ventricular branch and PDA fill by collaterals from the left coronary.  Distal left main calcified with 30 to 40% narrowing  Basal inferior to mid inferior wall motion abnormality hypokinetic to akinetic.  EF 55%.  LVEDP 16 mmHg  RECOMMENDATIONS:   T CTS consultation for coronary bypass surgery which should be done soon.  Resume IV heparin  Continue IV nitroglycerin  Patient to notify of recurrent chest pain.  Findings Coronary Findings Diagnostic  Dominance: Right  Left Main Mid LM to Dist LM lesion is 40% stenosed.  Left Anterior Descending Collaterals Dist LAD filled by collaterals from Acute Mrg.  Ost LAD to Prox LAD lesion is 45% stenosed. Mid LAD lesion is 95% stenosed.  First Diagonal Branch 1st Diag-1 lesion is 40% stenosed. 1st Diag-2 lesion is 85% stenosed.  Left Anterior Descending Collaterals Dist LAD filled by collaterals from Acute Mrg.  Ost LAD to Prox LAD lesion is 45% stenosed. Mid LAD lesion is 95% stenosed.  First Diagonal  Branch 1st Diag-1 lesion is 40% stenosed. 1st Diag-2 lesion is 85% stenosed.  Left Circumflex Mid Cx lesion is 85% stenosed. Mid Cx to Dist Cx lesion is 100% stenosed.  First Obtuse Marginal Branch Vessel is small in size.  Second Obtuse Marginal Branch Vessel is large in size.  Right Coronary Artery There is moderate diffuse disease throughout the vessel. Dist RCA lesion is 100% stenosed.  Right Posterior Descending Artery Collaterals RPDA filled by collaterals from 1st Sept.  Right Posterior Atrioventricular Artery RPAV lesion is 100% stenosed.  Intervention  No interventions have been documented.     ECHOCARDIOGRAM  ECHOCARDIOGRAM LIMITED 09/10/2022  Narrative ECHOCARDIOGRAM LIMITED REPORT    Patient Name:   INAYAH WOODIN [facility] Date of Exam: 09/10/2022 Medical Rec #:  [id number]      Height:       60.0 in Accession #:    [id number]     Weight:       169.8 lb Date of Birth:  09-28-1941      BSA:           1.741 m Patient Age:    81 years       BP:           114/75 mmHg Patient Gender: F              HR:           60 bpm. Exam Location:  Inpatient  Procedure: Limited Echo, Cardiac Doppler, Color Doppler and Intracardiac Opacification Agent  Indications:    NSTEMI I21.4  History:        Patient has prior history of Echocardiogram examinations, most recent 08/22/2022. Cardiomyopathy, NSTEMI and CAD, Prior CABG, Stroke, Arrythmias:Atrial Fibrillation and Atrial Flutter, Signs/Symptoms:Chest Pain, Dyspnea and Edema; Risk Factors:Hypertension, Diabetes, Dyslipidemia, Non-Smoker and Sleep Apnea.  Sonographer:    Aron Baba Referring Phys: [id number] [facility]   Sonographer Comments: Technically difficult study due to poor echo windows, suboptimal parasternal window, suboptimal apical window, patient is obese and no subcostal window. Image acquisition challenging due to respiratory motion. IMPRESSIONS   1. Left ventricular ejection fraction, by estimation, is 45 to 50%. The left ventricle has mildly decreased function. The left ventricle demonstrates regional wall motion abnormalities (see scoring diagram/findings for description). 2. Left atrial size was mildly dilated. 3. Mild mitral valve regurgitation.  Comparison(s): Prior images reviewed side by side.  FINDINGS Left Ventricle: Left ventricular ejection fraction, by estimation, is 45 to 50%. The left ventricle has mildly decreased function. The left ventricle demonstrates regional wall motion abnormalities. Definity contrast agent was given IV to delineate the left ventricular endocardial borders.   LV Wall Scoring: The mid inferior segment is akinetic.  Left Atrium: Left atrial size was mildly dilated.  Mitral Valve: Mild mitral valve regurgitation.  LEFT VENTRICLE PLAX 2D LVIDd:         4.90 cm LVIDs:         3.90 cm LV PW:         1.10 cm LV IVS:        0.70 cm  LV Volumes (MOD) LV vol d, MOD A2C: 85.2  ml LV vol d, MOD A4C: 41.7 ml LV vol s, MOD A2C: 33.1 ml LV vol s, MOD A4C: 16.4 ml LV SV MOD A2C:     52.1 ml LV SV MOD A4C:     41.7 ml LV SV MOD BP:      42.3 ml  LEFT ATRIUM  Index LA diam:        3.70 cm 2.13 cm/m LA Vol (A2C):   36.7 ml 21.08 ml/m LA Vol (A4C):   64.1 ml 36.81 ml/m LA Biplane Vol: 49.7 ml 28.54 ml/m MITRAL VALVE MV Area (PHT): 3.12 cm MV Decel Time: 243 msec MV E velocity: 75.20 cm/s MV A velocity: 69.10 cm/s MV E/A ratio:  1.09  Donato Schultz MD Electronically signed by Donato Schultz MD Signature Date/Time: 09/10/2022/2:59:33 PM    Final   TEE  ECHO INTRAOPERATIVE TEE 03/03/2020  Narrative *INTRAOPERATIVE TRANSESOPHAGEAL REPORT *    Patient Name:   CONNOR FOXWORTHY Arquette Date of Exam: 03/03/2020 Medical Rec #:  [id number]      Height:       60.0 in Accession #:    [id number]     Weight:       182.9 lb Date of Birth:  05-16-1941      BSA:          1.80 m Patient Age:    78 years       BP:           117/72 mmHg Patient Gender: F              HR:           60 bpm. Exam Location:  Inpatient  Transesophogeal exam was perform intraoperatively during surgical procedure. Patient was closely monitored under general anesthesia during the entirety of examination.  Indications:     coronary artery bypass Performing Phys: [id number] Eliezer Lofts LIGHTFOOT Diagnosing Phys: Marcene Duos MD  Complications: No known complications during this procedure. POST-OP IMPRESSIONS - Left Ventricle: The left ventricle is unchanged from pre-bypass. - Right Ventricle: The right ventricle appears unchanged from pre-bypass. - Aorta: The aorta appears unchanged from pre-bypass. - Left Atrium: The left atrium appears unchanged from pre-bypass. - Left Atrial Appendage: The left atrial appendage appears unchanged from pre-bypass. - Aortic Valve: The aortic valve appears unchanged from pre-bypass. - Mitral Valve: The mitral valve appears unchanged from pre-bypass. -  Tricuspid Valve: The tricuspid valve appears unchanged from pre-bypass. - Interatrial Septum: The interatrial septum appears unchanged from pre-bypass. - Interventricular Septum: The interventricular septum appears unchanged from pre-bypass. - Pericardium: The pericardium appears unchanged from pre-bypass.  PRE-OP FINDINGS Left Ventricle: The left ventricle has low normal systolic function, with an ejection fraction of 50-55%. The cavity size was normal. There is mildly increased left ventricular wall thickness.  Right Ventricle: The right ventricle has normal systolic function. The cavity was normal. There is no increase in right ventricular wall thickness.  Left Atrium: Left atrial size was normal in size.   Interatrial Septum: No atrial level shunt detected by color flow Doppler.  Pericardium: Trivial pericardial effusion is present.  Mitral Valve: The mitral valve is normal in structure. Mild thickening of the mitral valve leaflet. Mitral valve regurgitation is mild to moderate by color flow Doppler. The MR jet is centrally-directed.  Tricuspid Valve: The tricuspid valve was normal in structure. Tricuspid valve regurgitation is trivial by color flow Doppler.  Aortic Valve: The aortic valve is tricuspid Aortic valve regurgitation was not visualized by color flow Doppler. There is no stenosis of the aortic valve.  Pulmonic Valve: The pulmonic valve was normal in structure. Pulmonic valve regurgitation is trivial by color flow Doppler.   Aorta: The aortic root, ascending aorta and aortic arch are normal in size and structure. There is evidence of plaque in the descending aorta.  +--------------+--------++  LEFT VENTRICLE         +--------------+--------++ PLAX 2D                +--------------+--------++ LVOT diam:    2.10 cm  +--------------+--------++ LVOT Area:    3.46 cm +--------------+--------++                         +--------------+--------++   +--------------+-------+ SHUNTS                +--------------+-------+ Systemic Diam:2.10 cm +--------------+-------+   Marcene Duos MD Electronically signed by Marcene Duos MD Signature Date/Time: 03/03/2020/1:46:03 PM    Final           Recent Labs: 08/23/2022: TSH 3.495 09/09/2022: B Natriuretic Peptide 144.7 09/11/2022: Magnesium 1.9 09/26/2022: ALT 14 11/22/2022: Hemoglobin 13.9; Platelets 247 12/24/2022: BUN 23; Creatinine, Ser 1.46; Potassium 4.1; Sodium 143  Recent Lipid Panel    Component Value Date/Time   CHOL 111 08/22/2022 0503   CHOL 184 06/19/2018 0958   TRIG 106 08/22/2022 0503   HDL 44 08/22/2022 0503   HDL 45 06/19/2018 0958   CHOLHDL 2.5 08/22/2022 0503   VLDL 21 08/22/2022 0503   LDLCALC 46 08/22/2022 0503   LDLCALC 107 (H) 06/19/2018 0958    History of Present Illness    82 year old female with the above past medical history including CAD s/p CABG x5 in 2021, paroxysmal atrial fibrillation/atrial flutter, HmrEF, hypertension, hyperlipidemia, CVA, carotid artery stenosis, type II diabetes, OSA, anxiety, depression, and gout.    She was hospitalized in March 2021 in the setting of NSTEMI.  Cath showed three-vessel CAD, she underwent CABG x5 (LIMA-LAD, SVG-OM 2, OM 3, D1, PLV) in 03/2020.  She developed atrial fibrillation shortly after surgery.  Echocardiogram in 06/2022 in the setting of progressive shortness of breath, bilateral lower extremity edema showed EF 60 to 65%, normal LV function, no RWMA, mild LVH, indeterminate diastolic parameters, mild mitral valve regurgitation.  She was hospitalized in September 2023 in the setting of chest pain, shortness of breath.  She was noted to be in atrial fibrillation with RVR.  She converted to normal sinus rhythm on IV Cardizem drip.  Troponin was elevated.  Cardiology was consulted.  She underwent cardiac catheterization on 08/23/2022 which revealed patent LIMA-LAD  and SVG-RCA but occluded SVG to OMs and first diagonal.  LVEDP was severely elevated at 36 mmHg.  Medical therapy was recommended.  Unfortunately, she returned to the ED on 09/09/2022 recurrent atrial flutter with RVR, chest pain. Troponin was elevated, type II NSTEMI. She was started on amiodarone for rhythm control.  Repeat echocardiogram showed EF 50%.  There was mention of akinesis in the mid inferior segment, however, this was reviewed by Dr. Flora Lipps and was thought to be stable.  Her elevated troponin was thought to have occurred secondary to demand ischemia in the setting of rapid atrial flutter.  Her chest pain resolved with restoration of NSR.  She was last seen in the office on 12/24/2022 and was doing well.  She denies any symptoms concerning for angina.  She was maintaining sinus rhythm.  She presents today for follow-up.  Since her last visit she has done well from a cardiac standpoint.  She denies any symptoms concerning for angina, denies any palpitations, dizziness, dyspnea, edema, PND, orthopnea, weight gain.  Her activity has been somewhat limited in the setting of chronic knee pain.  She has received multiple gel injections with minimal relief.  She  is notes she will likely require knee surgery in the near future. In the meantime, she has been participating in water aerob[facility] 3 to [facility] [place] [facility] [place] [facility]  Overall, she reports feeling well.  Home Medications    Current Outpatient Medications  Medication Sig Dispense Refill   Accu-Chek Softclix Lancets lancets USE TO CHECK BLOOD SUGAR TWICE DAILY 102 each 0   amiodarone (PACERONE) 200 MG tablet 200 mg daily 60 tablet 3   apixaban (ELIQUIS) 5 MG TABS tablet Take 1 tablet (5 mg total) by mouth 2 (two) times daily. 180 tablet 2   aspirin EC 81 MG tablet Take 1 tablet (81 mg total) by mouth daily. Swallow whole. 90 tablet 3   atorvastatin (LIPITOR) 20 MG tablet Take 1 tablet (20 mg total) by mouth at bedtime. 90 tablet 2   Cyanocobalamin  (B-12 COMPLIANCE INJECTION) 1000 MCG/ML KIT Inject 1,000 mcg into the muscle every 30 (thirty) days.     DULoxetine (CYMBALTA) 60 MG capsule Take 60 mg by mouth daily.     empagliflozin (JARDIANCE) 25 MG TABS tablet Take 25 mg by mouth at bedtime.     furosemide (LASIX) 40 MG tablet TAKE ONE TABLET BY MOUTH ONCE DAILY 90 tablet 3   glucose blood (ACCU-CHEK AVIVA) test strip Test twice daily 100 each 2   isosorbide mononitrate (IMDUR) 30 MG 24 hr tablet TAKE ONE TABLET BY MOUTH ONCE DAILY 90 tablet 3   losartan (COZAAR) 25 MG tablet TAKE ONE TABLET BY MOUTH EVERYDAY AT BEDTIME 90 tablet 3   metFORMIN (GLUCOPHAGE-XR) 750 MG 24 hr tablet Take 750 mg by mouth daily with breakfast.     metoprolol tartrate (LOPRESSOR) 25 MG tablet TAKE 1/2 TABLET BY MOUTH AT BREAKFAST AND AT BEDTIME 90 tablet 3   NON FORMULARY 1 each by Other route See admin instructions. CPAP machine nightly     spironolactone (ALDACTONE) 25 MG tablet TAKE ONE TABLET BY MOUTH ONCE DAILY 30 tablet 3   traZODone (DESYREL) 100 MG tablet Take 150 mg by mouth at bedtime.     No current facility-administered medications for this visit.     Review of Systems    She denies chest pain, palpitations, dyspnea, pnd, orthopnea, n, v, dizziness, syncope, edema, weight gain, or early satiety. All other systems reviewed and are otherwise negative except as noted above.   Physical Exam    VS:  BP 110/66 (BP Location: Left Arm, Patient Position: Sitting, Cuff Size: Normal)   Pulse 67   Ht 5' (1.524 m)   Wt 171 lb 6.4 oz (77.7 kg)   SpO2 95%   BMI 33.47 kg/m   GEN: Well nourished, well developed, in no acute distress. HEENT: normal. Neck: Supple, no JVD, carotid bruits, or masses. Cardiac: RRR, no murmurs, rubs, or gallops. No clubbing, cyanosis, edema.  Radials/DP/PT 2+ and equal bilaterally.  Respiratory:  Respirations regular and unlabored, clear to auscultation bilaterally. GI: Soft, nontender, nondistended, BS + x 4. MS: no deformity  or atrophy. Skin: warm and dry, no rash. Neuro:  Strength and sensation are intact. Psych: Normal affect.  Accessory Clinical Findings    ECG personally reviewed by me today -    - no EKG in office today.   Lab Results  Component Value Date   WBC 8.0 11/22/2022   HGB 13.9 11/22/2022   HCT 42.1 11/22/2022   MCV 91.7 11/22/2022   PLT 247 11/22/2022   Lab Results  Component Value Date  CREATININE 1.46 (H) 12/24/2022   BUN 23 12/24/2022   NA 143 12/24/2022   K 4.1 12/24/2022   CL 101 12/24/2022   CO2 27 12/24/2022   Lab Results  Component Value Date   ALT 14 09/26/2022   AST 15 09/26/2022   ALKPHOS 129 (H) 09/26/2022   BILITOT <0.2 09/26/2022   Lab Results  Component Value Date   CHOL 111 08/22/2022   HDL 44 08/22/2022   LDLCALC 46 08/22/2022   TRIG 106 08/22/2022   CHOLHDL 2.5 08/22/2022    Lab Results  Component Value Date   HGBA1C 6.7 (H) 08/22/2022    Assessment & Plan   1.CAD: S/p CABG x5 in 03/2020. Cath in 08/2022 revealed patent LIMA-LAD and SVG-RCA but occluded SVG to OMs and first diagonal. Medical therapy was recommended. Stable with no anginal symptoms.  Continue aspirin, metoprolol, losartan, Imdur, and Lipitor.   2.  HFmrEF:  Most recent echo showed EF 50%.   Euvolemic and well compensated on exam.  Continue metoprolol, losartan, spironolactone, Jardiance, and Lasix.    3. Paroxysmal atrial fibrillation/atrial flutter: Maintaining NSR.  Chest x-ray in 09/2022 was stable.  She is due for updated CBC, CMET, TSH.  She states she will have labs drawn with her PCP at her annual physical in 09/2023.  Continue amiodarone, metoprolol, Eliquis.   4. Hypertension: BP well controlled. Continue current antihypertensive regimen.    5. Hyperlipidemia: LDL was 46 in 08/2022.  Continue aspirin, Lipitor.   6. Carotid artery stenosis: Most recent Dopplers in 2021 showed 1 to 39% B ICA stenosis.  Asymptomatic.  No indication for repeat study at this time.  Continue  aspirin, Lipitor.   7. History of CVA: No recurrence.  Continue aspirin, Eliquis, Lipitor.   8. Type 2 diabetes: A1c in 08/2022 was 7.0.  Monitored and managed per PCP.   9. OSA: Continue CPAP.  10. Preoperative cardiac exam: Patient notes that she will likely require knee surgery in the near future.  According to the Revised Cardiac Risk Index (RCRI), her Perioperative Risk of Major Cardiac Event is (%): 11. Her Functional Capacity in METs is: 4.4 according to the [employer] Activity Status Index (DASI). Therefore, based on ACC/AHA guidelines, patient would be at acceptable risk for the planned procedure without further cardiovascular testing.  If we do receive a request for surgical clearance, she would need pharmacy recommendations for holding Eliquis prior to surgical procedure.   11. Disposition: Follow-up in 6 months with Dr. Flora Lipps.     Joylene Grapes, NP 06/27/2023, 4:38 PM

## 2023-06-27 NOTE — Patient Instructions (Signed)
Medication Instructions:  Your physician recommends that you continue on your current medications as directed. Please refer to the Current Medication list given to you today.  *If you need a refill on your cardiac medications before your next appointment, please call your pharmacy*   Lab Work: None  If you have labs (blood work) drawn today and your tests are completely normal, you will receive your results only by: MyChart Message (if you have MyChart) OR A paper copy in the mail If you have any lab test that is abnormal or we need to change your treatment, we will call you to review the results.   Testing/Procedures: None   Follow-Up: At [facility], you and your health needs are our priority.  As part of our continuing mission to provide you with exceptional heart care, we have created designated Provider Care Teams.  These Care Teams include your primary Cardiologist (physician) and Advanced Practice Providers (APPs -  Physician Assistants and Nurse Practitioners) who all work together to provide you with the care you need, when you need it.  We recommend signing up for the patient portal called "MyChart".  Sign up information is provided on this After Visit Summary.  MyChart is used to connect with patients for Virtual Visits (Telemedicine).  Patients are able to view lab/test results, encounter notes, upcoming appointments, etc.  Non-urgent messages can be sent to your provider as well.   To learn more about what you can do with MyChart, go to [email or web address].    Your next appointment:   6 month(s)  Provider:   Reatha Harps, MD

## 2023-07-26 ENCOUNTER — Other Ambulatory Visit: Payer: Self-pay

## 2023-07-26 MED ORDER — AMIODARONE HCL 200 MG PO TABS: ORAL_TABLET | ORAL | 3 refills | Status: DC

## 2023-08-02 ENCOUNTER — Other Ambulatory Visit: Payer: Self-pay | Admitting: Cardiovascular Disease

## 2023-09-05 ENCOUNTER — Encounter: Payer: Self-pay | Admitting: Neurology

## 2023-09-05 ENCOUNTER — Ambulatory Visit: Payer: Medicare Other | Admitting: Neurology

## 2023-09-05 VITALS — BP 119/64 | HR 60 | Ht 60.0 in | Wt 172.6 lb

## 2023-09-05 DIAGNOSIS — G3184 Mild cognitive impairment, so stated: Secondary | ICD-10-CM

## 2023-09-05 MED ORDER — CYANOCOBALAMIN 1000 MCG/ML IJ SOLN
1000.0000 ug | Freq: Once | INTRAMUSCULAR | Status: AC
  Administered 2023-09-05: 1000 ug via INTRAMUSCULAR

## 2023-09-05 NOTE — Patient Instructions (Signed)

## 2023-09-05 NOTE — Progress Notes (Signed)
Verbal order from Dr. [name] for B 12 injection.  Under aseptic technique cyanocobalamin 1090mcg/1ml IM given  R deltoid. .  Tolerated well.  Bandaid applied.

## 2023-09-05 NOTE — Progress Notes (Signed)
[facility]    Provider:  Dr [name] Requesting Provider: Melida Quitter, MD Primary Care Provider:  Melida Quitter, MD  CC:  Memory loss  HPI: 82 year old here for memory loss saw her in 2021 does not seem particularly worse even after being diagnosed with mild cognitive impairment many years ago with Dr. Kieth Brightly.  has HTN (hypertension); Anxiety; Depression; Lumbar stenosis with neurogenic claudication; OSA on CPAP; Class 2 severe obesity with serious comorbidity and body mass index (BMI) of 35.0 to 35.9 in adult, unspecified obesity type (HCC); Type 2 diabetes mellitus, controlled (HCC); Hyperlipidemia associated with type 2 diabetes mellitus (HCC); NSTEMI (non-ST elevated myocardial infarction) (HCC); Cerebral thrombosis with cerebral infarction; S/P CABG (coronary artery bypass graft); Atrial fibrillation (HCC); Coronary artery disease; Memory loss; Dementia with behavioral disturbance (HCC); Atrial flutter with rapid ventricular response (HCC); and Elevated troponin I level on their problem list.   She goes into a room and forgets why she is there. She can't remember new names. More short-term memory loss. She was diagnosed with MCI saw Dr. Kieth Brightly. B12 seemed to help in the past when it was decreased. She feels like she is getting worse. She can;t remember everything. Still driving, not getting lost, she is not forgetting bills,still independent, symptoms ongoing for approx 4-5 years, her MMSE is not change, tried to reassure patient that if she had Alzheimer's she likely would be more progressed by now, I saw her back in 2021, And at the time stated she could not take her own medications that she was leaving the stvne on and she was not recognizing people.  Her mother had dementia, patient says that she sometimes says things that are inappropriate with them before maybe a little frustrated also depression, no alcohol or drug use, patient noticed after having COVID that her  memory was not as good, also after heart attack and stroke, worse after the heart attack, getting words out, she has forgotten people's names, her mother did not have dementia so she was 80, and her father had dementia also she states Alzheimer's.  We discussed workup including APOE4 and ATN due to perceived cognitive decline and FHx of alzheimer's in mother. However her MMSE has not changed, she is still 27/30 and was 27/30 in 2021 if she had alzheimer's she would be much further decline dover 3 years. Tried to reassure patient. We have    Reviewed notes, labs and imaging from outside physicians, which showed:   CLINICAL DATA:  Fall, head trauma, on Eliquis   EXAM: CT HEAD WITHOUT CONTRAST   TECHNIQUE: Contiguous axial images were obtained from the base of the skull through the vertex without intravenous contrast.   RADIATION DOSE REDUCTION: This exam was performed according to the departmental dose-optimization program which includes automated exposure control, adjustment of the mA and/or kV according to patient size and/or use of iterative reconstruction technique.   COMPARISON:  09/27/2019   FINDINGS: Brain: No evidence of acute infarction, hemorrhage, hydrocephalus, extra-axial collection or mass lesion/mass effect.   Mild cortical and central atrophy.   Subcortical white matter and periventricular small vessel ischemic changes.   Vascular: Intracranial atherosclerosis.   Skull: Normal. Negative for fracture or focal lesion.   Sinuses/Orbits: The visualized paranasal sinuses are essentially clear. The mastoid air cells are unopacified.   Other: None.   IMPRESSION: No evidence of acute intracranial abnormality.   Atrophy with small vessel ischemic changes.  Recent Results (from the past 2160 hour(s))  ATN PROFILE  Status: None (Preliminary result)   Collection Time: 09/05/23  2:31 PM  Result Value Ref Range   A -- Beta-amyloid 42/40 Ratio WILL FOLLOW     Beta-amyloid 42 WILL FOLLOW    Beta-amyloid 40 WILL FOLLOW    T -- p-tau181 WILL FOLLOW    N -- NfL, Plasma WILL FOLLOW    ATN SUMMARY WILL FOLLOW    Information: WILL FOLLOW       Latest Ref Rng & Units 12/24/2022    8:29 AM 11/22/2022    1:33 AM 09/26/2022    4:55 PM  CMP  Glucose 70 - 99 mg/dL 841  324  401   BUN 8 - 27 mg/dL 23  25  19    Creatinine 0.57 - 1.00 mg/dL 0.27  2.53  6.64   Sodium 134 - 144 mmol/L 143  139  144   Potassium 3.5 - 5.2 mmol/L 4.1  3.6  4.2   Chloride 96 - 106 mmol/L 101  101  100   CO2 20 - 29 mmol/L 27  29  27    Calcium 8.7 - 10.3 mg/dL 9.2  9.4  9.2   Total Protein 6.0 - 8.5 g/dL   6.5   Total Bilirubin 0.0 - 1.2 mg/dL   <4.0   Alkaline Phos 44 - 121 IU/L   129   AST 0 - 40 IU/L   15   ALT 0 - 32 IU/L   14       Latest Ref Rng & Units 11/22/2022    1:33 AM 09/11/2022    3:13 AM 09/10/2022    3:39 AM  CBC  WBC 4.0 - 10.5 K/uL 8.0  8.4  10.9   Hemoglobin 12.0 - 15.0 g/dL 34.7  42.5  95.6   Hematocrit 36.0 - 46.0 % 42.1  43.0  42.9   Platelets 150 - 400 K/uL 247  219  235     Patient complains of symptoms per HPI as well as the following symptoms: depression . Pertinent negatives and positives per HPI. All others negative   HPI 08/22/2020:  Judy [name] is a 82 y.o. female here as requested by Melida Quitter, MD for dementia. PMHx Fhx dementia, sleep apnea, HTN, MI, diabetes, anxiety. She uses her cpap "faithfully" just go tit fixed. PMHx stroke, sleep apnea, HTN, diabetes, depression, anxiety.  Per friend: She cannot take her own medications. She forgets to eat or take her blood sugar. She leaves the stove on. At church on Sundays she didn't recognize that someone she knew for 40 years didn't have a daughter there, she states she has taste problems. Dorinda Hill, MD is her primary care. She had Covid in November, progressed since having a heart attack and a stroke in March and since having covid, her balance has been awful, her friend has been  noticing for several years slowly progressive more short-term memory loss. She provides most information and I met with her for 15 minutes prior to appointment She keeps "babbling" and cardiology was concerned, she is also concerned about falls. She is falling a lot. She has a son who is not involved. The roommate helps a lot and she is helping a lot, she is trying to help with medications and trying to help, she doesn't have a will, unknown POA. She is having a physical. Mother had dementia. She has "lost her filter" and says inappropriate things she wouldn;t before. Maybe a little aggressive, frustrated, anxious. +depression. No alcohol use. She  repeats things in the same day.  Per Patient says her memory is from covid, heart attack and the stroke. She started noticing worsening after covid but moreso after heart attack. <ain problem is getting the words out. Sometimes thinking she can see the word but can't get it out. She sews every day. She doesn't have any problems with quilts, no tremors, she says she sent some bills to the wrong place, her roommate helps with medications and she was scared to mess with her meds, she remembers to take them but rommate helps but them in a box, she is not driving anymore but would like to and doesn't go anywhere far away but she is not driving, she denies car accidents, alcohol use, accidents in the home such as leaving the stove on, she has always forgotten people's names, mother had dementia at the age of 40, she is not eating well, father had dementia alzheimers.   No other focal neurologic deficits, associated symptoms, inciting events or modifiable factors.   Reviewed notes, labs and imaging from outside physicians, which showed:   Personally reviewed imaging of the brain MRI 3/302021(Also reviewed with patient and her friend) and agree with the following:  IMPRESSION: 1. Subcentimeter acute right cerebellar infarct. 2. Moderate chronic small vessel ischemic  disease and cerebral atrophy.    Review of Systems: Patient complains of symptoms per HPI as well as the following symptoms: memory loss falls. Pertinent negatives and positives per HPI. All others negative.   Social History   Socioeconomic History   Marital status: Divorced    Spouse name: Not on file   Number of children: 1   Years of education: 12   Highest education level: Some college, no degree  Occupational History   Occupation: CNA  Tobacco Use   Smoking status: Never   Smokeless tobacco: Never  Vaping Use   Vaping status: Never Used  Substance and Sexual Activity   Alcohol use: No    Alcohol/week: 0.0 standard drinks of alcohol   Drug use: No   Sexual activity: Not Currently  Other Topics Concern   Not on file  Social History Narrative   Lives with Roommate   Drinks 32 ounces of caffeine daily   Right Handed   Social Determinants of Health   Financial Resource Strain: Not on file  Food Insecurity: No Food Insecurity (08/22/2022)   Hunger Vital Sign    Worried About Running Out of Food in the Last Year: Never true    Ran Out of Food in the Last Year: Never true  Transportation Needs: No Transportation Needs (08/22/2022)   PRAPARE - Administrator, Civil Service (Medical): No    Lack of Transportation (Non-Medical): No  Physical Activity: Not on file  Stress: Not on file  Social Connections: Not on file  Intimate Partner Violence: Not At Risk (08/22/2022)   Humiliation, Afraid, Rape, and Kick questionnaire    Fear of Current or Ex-Partner: No    Emotionally Abused: No    Physically Abused: No    Sexually Abused: No    Family History  Problem Relation Age of Onset   Dementia Mother    AAA (abdominal aortic aneurysm) Father    Breast cancer Maternal Aunt    Bladder Cancer Brother    Colon cancer Neg Hx    Stomach cancer Neg Hx    Esophageal cancer Neg Hx    Pancreatic cancer Neg Hx     Past Medical History:  Diagnosis Date   Anxiety     Arthritis    Back pain    Coronary artery disease    Depression    Diabetes (HCC)    Dyspnea    Gout    Heart attack [facility])    March 2021   History of kidney stones    Hyperlipidemia    Hypertension    Leg edema    Pancreatitis, chronic (HCC) 03/2016   Pneumonia due to COVID-19 virus 12/03/2019   Sleep apnea    C-PAP   Stroke [facility])    March 2021 Rt side weakness    Patient Active Problem List   Diagnosis Date Noted   Elevated troponin I level 08/22/2022   Atrial flutter with rapid ventricular response (HCC) 08/21/2022   Memory loss 08/22/2020   Dementia with behavioral disturbance (HCC) 08/22/2020   Coronary artery disease 05/17/2020   Atrial fibrillation (HCC) 03/11/2020   S/P CABG (coronary artery bypass graft) 03/03/2020   Cerebral thrombosis with cerebral infarction 03/02/2020   NSTEMI (non-ST elevated myocardial infarction) (HCC) 02/29/2020   Hyperlipidemia associated with type 2 diabetes mellitus (HCC) 02/09/2020   Type 2 diabetes mellitus, controlled (HCC) 01/12/2020   Class 2 severe obesity with serious comorbidity and body mass index (BMI) of 35.0 to 35.9 in adult, unspecified obesity type (HCC) 12/25/2018   OSA on CPAP 11/04/2017   Lumbar stenosis with neurogenic claudication 11/19/2016   HTN (hypertension) 03/21/2016   Anxiety    Depression     Past Surgical History:  Procedure Laterality Date   BACK SURGERY  2004   BREAST LUMPECTOMY WITH RADIOACTIVE SEED LOCALIZATION Right 10/23/2019   Procedure: RIGHT BREAST LUMPECTOMY WITH RADIOACTIVE SEED LOCALIZATION;  Surgeon: Griselda Miner, MD;  [facility] OR;  Service: General;  Laterality: Right;   CHOLECYSTECTOMY  2001   cardiac event during surgery   COLONOSCOPY  2013   CORONARY ARTERY BYPASS GRAFT N/A 03/03/2020   Procedure: CORONARY ARTERY BYPASS GRAFTING (CABG), ON PUMP, TIMES FIVE, USING LEFT INTERNAL MAMMARY ARTERY AND ENDOSCOPICALLY HARVESTED BILATERAL GREATER SAPHENOUS VEINS -flow track only;   Surgeon: Corliss Skains, MD;  Location: [facility] OR;  Service: Open Heart Surgery;  Laterality: N/A;   LEFT HEART CATH AND CORONARY ANGIOGRAPHY N/A 03/01/2020   Procedure: LEFT HEART CATH AND CORONARY ANGIOGRAPHY;  Surgeon: Lyn Records, MD;  Location: [facility] INVASIVE CV LAB;  Service: Cardiovascular;  Laterality: N/A;   LEFT HEART CATH AND CORS/GRAFTS ANGIOGRAPHY N/A 08/23/2022   Procedure: LEFT HEART CATH AND CORS/GRAFTS ANGIOGRAPHY;  Surgeon: Swazil[place], Peter M, MD;  [facility] INVASIVE CV LAB;  Service: Cardiovascular;  Laterality: N/A;   REVERSE SHOULDER ARTHROPLASTY Left 06/21/2022   Procedure: REVERSE SHOULDER ARTHROPLASTY;  Surgeon: Francena Hanly, MD;  Location: [facility] ORS;  Service: Orthopedics;  Laterality: Left;    TEE WITHOUT CARDIOVERSION N/A 03/03/2020   Procedure: TRANSESOPHAGEAL ECHOCARDIOGRAM (TEE);  Surgeon: Corliss Skains, MD;  Location: [facility] OR;  Service: Open Heart Surgery;  Laterality: N/A;    Current Outpatient Medications  Medication Sig Dispense Refill   Accu-Chek Softclix Lancets lancets USE TO CHECK BLOOD SUGAR TWICE DAILY 102 each 0   amiodarone (PACERONE) 200 MG tablet 200 mg daily 90 tablet 3   apixaban (ELIQUIS) 5 MG TABS tablet Take 1 tablet (5 mg total) by mouth 2 (two) times daily. 180 tablet 2   aspirin EC 81 MG tablet Take 1 tablet (81 mg total) by mouth daily. Swallow whole. 90 tablet 3   atorvastatin (LIPITOR)  20 MG tablet Take 1 tablet (20 mg total) by mouth at bedtime. 90 tablet 2   Cyanocobalamin (B-12 COMPLIANCE INJECTION) 1000 MCG/ML KIT Inject 1,000 mcg into the muscle every 30 (thirty) days.     DULoxetine (CYMBALTA) 60 MG capsule Take 60 mg by mouth daily.     empagliflozin (JARDIANCE) 25 MG TABS tablet Take 25 mg by mouth at bedtime.     furosemide (LASIX) 40 MG tablet TAKE ONE TABLET BY MOUTH ONCE DAILY 90 tablet 3   glucose blood (ACCU-CHEK AVIVA) test strip Test twice daily 100 each 2   isosorbide mononitrate (IMDUR) 30 MG 24 hr tablet TAKE  ONE TABLET BY MOUTH ONCE DAILY 90 tablet 3   losartan (COZAAR) 25 MG tablet TAKE ONE TABLET BY MOUTH EVERYDAY AT BEDTIME 90 tablet 3   metFORMIN (GLUCOPHAGE-XR) 750 MG 24 hr tablet Take 750 mg by mouth daily with breakfast.     metoprolol tartrate (LOPRESSOR) 25 MG tablet TAKE 1/2 TABLET BY MOUTH AT BREAKFAST AND AT BEDTIME 90 tablet 3   NON FORMULARY 1 each by Other route See admin instructions. CPAP machine nightly     spironolactone (ALDACTONE) 25 MG tablet TAKE 1 TABLET BY MOUTH ONCE DAILY 30 tablet 10   traZODone (DESYREL) 100 MG tablet Take 150 mg by mouth at bedtime.     No current facility-administered medications for this visit.    Allergies as of 09/05/2023   (No Known Allergies)    Vitals: BP 119/64   Pulse 60   Ht 5' (1.524 m)   Wt 172 lb 9.6 oz (78.3 kg)   BMI 33.71 kg/m  Last Weight:  Wt Readings from Last 1 Encounters:  09/05/23 172 lb 9.6 oz (78.3 kg)   Last Height:   Ht Readings from Last 1 Encounters:  09/05/23 5' (1.524 m)    Physical exam: Exam: Gen: NAD, conversant, well nourised, obese, well groomed                     CV: RRR, no MRG. No Carotid Bruits. No peripheral edema, warm, nontender Eyes: Conjunctivae clear without exudates or hemorrhage  Neuro: Detailed Neurologic Exam  Speech:    Speech is normal; fluent and spontaneous with normal comprehension.  Cognition:     09/05/2023    1:37 PM 08/22/2020   11:18 AM  MMSE - Mini Mental State Exam  Orientation to time 5 4  Orientation to Place 5 4  Registration 3 3  Attention/ Calculation 4 5  Recall 3 3  Language- name 2 objects 2 2  Language- repeat 0 1  Language- follow 3 step command 3 3  Language- read & follow direction 1 1  Write a sentence 1 0  Copy design 0 1  Total score 27 27       The patient is oriented to person, place, and time;     recent and remote memory intact;     language fluent;     normal attention, concentration,     fund of knowledge Cranial Nerves:    The  pupils are equal, round, and reactive to light.  Attempted funduscopic exam cannot visualize fundi due to small pupils visual fields are full to finger confrontation. Extraocular movements are intact. Trigeminal sensation is intact and the muscles of mastication are normal. The face is symmetric. The palate elevates in the midline. Hearing intact. Voice is normal. Shoulder shrug is normal. The tongue has normal motion without fasciculations.  Coordination: Normal Gait: Normal Motor Observation:    No asymmetry, no atrophy, and no involuntary movements noted. Tone:    Normal muscle tone.    Posture:    Posture is normal. normal erect    Strength:    Strength is V/symmetrical in the upper and lower limbs.      Sensation: intact to LT     Reflex Exam:  DTR's:    Deep tendon reflexes in the upper and lower extremities are symmetrical bilaterally.   Toes:    The toes are equivocal bilaterally.   Clonus:    Clonus is absent.     Assessment/Plan: Patient who was seen in 2021 diagnosed with mild cognitive impairment however MMSE was 27 out of 30 in September 2021 and during this visit it is also 71, does not seem particularly worse was diagnosed with mild cognitive impairment several years ago with Dr. Crissie Figures has moderate chronic microvascular ischemic changes, if patient had progressive neurocognitive/neurodegenerative disorder she would be more progressed in the several years since I seen her I do not suspect that this is what is going on.  We discussed in detail, at this time we will check an ATN profile for amyloid beta 42, tau and if abnormal we can go forward but considering she is still stable after several years I recommend if this is normal we monitor clinically.  In September 2021 we discussed POA, healthcare power of attorney, social services as needed, possibly formal driving if appropriate but she states she is not having accidents and not getting lost, recommended  physical therapy if she feels she needs it but at this time she does not complain of any gait disorders, we discussed white matter changes that we saw her in her brain and ways to minimize that, in the past she had MRI of the brain, FDG PET scan and formal memory testing with Dr. Kieth Brightly that did not show Alzheimer's.   Orders Placed This Encounter  Procedures   ATN PROFILE   Meds ordered this encounter  Medications   cyanocobalamin (VITAMIN B12) injection 1,000 mcg    Cc: Melida Quitter, MD,  Nadene Rubins Nyoka Cowden, MD  Naomie Dean, MD  [facility] [address] [place], [place] [address]  Phone [phone] Fax [phone]

## 2023-09-08 ENCOUNTER — Encounter: Payer: Self-pay | Admitting: Neurology

## 2023-09-14 LAB — ATN PROFILE
A -- Beta-amyloid 42/40 Ratio: 0.102 — ABNORMAL LOW (ref >0.102)
Beta-amyloid 40: 288.03 pg/mL
Beta-amyloid 42: 29.32 pg/mL
N -- NfL, Plasma: 8.76 pg/mL (ref 0.00–11.55)
T -- p-tau181: 2.08 pg/mL — ABNORMAL HIGH (ref 0.00–0.97)

## 2023-09-19 ENCOUNTER — Telehealth: Payer: Self-pay

## 2023-09-19 ENCOUNTER — Other Ambulatory Visit: Payer: Self-pay | Admitting: *Deleted

## 2023-09-19 ENCOUNTER — Telehealth: Payer: Self-pay | Admitting: Cardiovascular Disease

## 2023-09-19 ENCOUNTER — Encounter: Payer: Self-pay | Admitting: Cardiovascular Disease

## 2023-09-19 DIAGNOSIS — I480 Paroxysmal atrial fibrillation: Secondary | ICD-10-CM

## 2023-09-19 DIAGNOSIS — G3184 Mild cognitive impairment, so stated: Secondary | ICD-10-CM

## 2023-09-19 MED ORDER — APIXABAN 5 MG PO TABS: 5.0000 mg | ORAL_TABLET | Freq: Two times a day (BID) | ORAL | 11 refills | Status: DC

## 2023-09-19 MED ORDER — APIXABAN 5 MG PO TABS: 5.0000 mg | ORAL_TABLET | Freq: Two times a day (BID) | ORAL | 1 refills | Status: DC

## 2023-09-19 NOTE — Telephone Encounter (Signed)
Eliquis 5mg  refill request received. Patient is 82 years old, weight-78.3kg, Crea-1.46 on 12/24/22, Diagnosis-Afib, and last seen by Bernadene Person on 06/27/23. Dose is appropriate based on dosing criteria. Will send in refill to requested pharmacy.

## 2023-09-19 NOTE — Telephone Encounter (Signed)
I contacted pt, informed her Dr [name] review the lab work that looks to see if she carry the biomarkers for Alzheimer's disease. The blood work is borderline for abnormal proteins that we can see in alzheimer's dementia. This is not considered a diagnostic test but has been commonly seen in patient's that develop alzheimer's. Dr [name] recommends for the next step is consider a confirmatory test of the head to evaluate for these abnormal proteins in the brain. Pt agreed to do PET Amyloid, order is placed.

## 2023-09-19 NOTE — Telephone Encounter (Signed)
-----   Message from Anson Fret sent at 09/15/2023  4:53 PM EDT ----- Pod 4: please call and explain and ask if she would like a CT PET Amyloid thank you: Ms. Judy [name], blood work is borderline for abnormal proteins we can see in alzheimer's dementia. However we can see these changes years beofre alzheimers begins. We do have new alzheimers medications which are more effective in those like you who are very mildly affected. Let me know if you would like a confirmatory test a CT scan of the head to evaluate for these abnormal proteins in the brain. My office will call you (fyi Dr. Nadene Rubins)

## 2023-09-19 NOTE — Telephone Encounter (Signed)
*  STAT* If patient is at the pharmacy, call can be transferred to refill team.   1. Which medications need to be refilled? (please list name of each medication and dose if known)  Eliquis   2. Would you like to learn more about the convenience, safety, & potential cost savings by using the [facility]?     3. Are you open to using the [facility] (Type [facility].  ).   4. Which pharmacy/location (including street and city if local pharmacy) is medication to be sent to?[facility]  [address], [place]   5. Do they need a 30 day or 90 day supply? 90 days # 180 and refills- please call in today- been out for awhile

## 2023-09-30 ENCOUNTER — Telehealth: Payer: Self-pay | Admitting: Neurology

## 2023-09-30 NOTE — Telephone Encounter (Signed)
[employer] medicare Berkley Harvey: [id number] exp. 09/30/23-11/14/23 sent to [facility] [phone]

## 2023-10-15 ENCOUNTER — Encounter (HOSPITAL_COMMUNITY)
Admission: RE | Admit: 2023-10-15 | Discharge: 2023-10-15 | Disposition: A | Discharge status: Home / Self Care | Payer: Medicare Other | Source: Ambulatory Visit | Attending: Neurology | Admitting: Neurology

## 2023-10-15 DIAGNOSIS — G3184 Mild cognitive impairment, so stated: Secondary | ICD-10-CM | POA: Insufficient documentation

## 2023-10-15 MED ORDER — FLORBETAPIR F 18 500-1900 MBQ/ML IV SOLN
9.5300 | Freq: Once | INTRAVENOUS | Status: AC
  Administered 2023-10-15: 9.53 via INTRAVENOUS

## 2023-11-04 ENCOUNTER — Telehealth: Payer: Self-pay | Admitting: *Deleted

## 2023-11-04 NOTE — Telephone Encounter (Signed)
Spoke with pt and discussed pet CT scan results for brain. Pt was very happy with results and said she would show her family the mychart message she has. I told her that per Dr [name], she probably still has a mild cognitive impairment but CT scan results are not c/w Alzheimer's at this time. She has some plaques in blood but not in brain. Pt thanked me for the call.

## 2023-11-04 NOTE — Telephone Encounter (Signed)
-----   Message from Anson Fret sent at 11/04/2023 12:08 PM EST ----- FYI Dr. Nadene Rubins: CT scan did not show a lot of evidence for alzheimer's disease, this is great news. You did have borderline amyloid in the blood but without plaques in the brain a diagnoses of alzheimer's is not made. She likely still has mild cognitive impairment, which is great news.. not progressed to alzheimers disease. Great news thank you (pod 4 please call and just let her know thank you)

## 2023-12-02 ENCOUNTER — Other Ambulatory Visit: Payer: Self-pay | Admitting: Internal Medicine

## 2023-12-02 ENCOUNTER — Ambulatory Visit
Admission: RE | Admit: 2023-12-02 | Discharge: 2023-12-02 | Disposition: A | Discharge status: Home / Self Care | Payer: Medicare Other | Source: Ambulatory Visit | Attending: Internal Medicine

## 2023-12-02 DIAGNOSIS — R921 Mammographic calcification found on diagnostic imaging of breast: Secondary | ICD-10-CM

## 2024-02-18 NOTE — Progress Notes (Unsigned)
 Cardiology Office Note:  .   Date:  02/20/2024  ID:  AZA DANTES, DOB 1941-02-13, MRN [id number] PCP: Melida Quitter, MD  [facility] Providers Cardiologist:  Reatha Harps, MD { History of Present Illness: .    Chief Complaint  Patient presents with   Follow-up    Judy [name] is a 83 y.o. female with history of CAD, HFmEF, HLD, CVA, Afib who presents for follow-up.    History of Present Illness   Judy [name] is an 83 year old female with heart failure, paroxysmal atrial fibrillation, CAD s/p CABG and a history of CVA who presents for a cardiology follow-up.  She has heart failure with mid-range ejection fraction and is currently on metoprolol, losartan, and aldactone. She underwent a left heart catheterization in September 2023, which showed occluded vein grafts to the obtuse marginal and diagonal branches. No current chest pain, although she occasionally experiences discomfort on the right side. She is maintaining sinus rhythm on amiodarone 200 mg daily.  She has a history of paroxysmal atrial fibrillation and is currently maintaining sinus rhythm on amiodarone. She is on Eliquis for anticoagulation, but her dose needs adjustment due to kidney function concerns. No recent episodes of atrial fibrillation or flutter since October 2023.  Her blood pressure was recently measured at 62/40 mmHg, which is low. No adjustments to her medications following this measurement. She is on a CPAP machine at night and experiences shortness of breath frequently. She does not engage in regular exercise but plans to restart water aerobics due to balance issues.  Her past medical history includes coronary artery disease with a history of coronary artery bypass grafting. Her most recent LDL cholesterol was 40 mg/dL. She is on Lipitor 20 mg daily and reports no recent symptoms of angina.  Her diabetes is well-controlled with an A1c of 6.7%. She is on Jardiance 25 mg daily for diabetes  management.  She mentions her kidney function was noted to be 'down' by her primary care physician, but no specific details were provided. She is not currently seeing a nephrologist.  She has a history of receiving gel injections in her knees and has had recent blood work done by a rheumatologist. She is advised to increase physical activity due to balance issues and to maintain overall health.          Problem List 1. CAD/NSTEMI s/p CABG x 5 -02/2020 with NSTEMI and had 3v CAD -03/03/2020: CABG x 5; LIMA-LAD, SVG to OM2, OM3, D1, PLV (OM3 jump graft off D1) -08/23/2022: patent LIMA-LAD, SVG-RCA, SVG-OM1/2 occluded, SVG-D1 occluded  2. HFmEF -09/2022: EF 45-50% -08/2022: EF 50-55% -06/2022: EF 60-65% 09/2022: EF 45-50% 3. HTN 4. DM -A1c 6.7 -T chol 124, HDL 62, LDL 40, TG 130 5. CVA -post cath complication  6. OSA 7. Afib/flutter, paroxysmal  -CHADSVASC= 8 (age, female, CVA, MI, DM, HTN) -developed after CABG 03/11/2020 -recurrence 09/2022 -> NSR on amiodarone  8. Carotid Artery stenosis -1-39% bilatearlly  9. Mild cognitive impairment     ROS: All other ROS reviewed and negative. Pertinent positives noted in the HPI.     Studies Reviewed: [name]   EKG Interpretation Date/Time:  Thursday February 20 2024 15:09:15 EDT Ventricular Rate:  63 PR Interval:  216 QRS Duration:  90 QT Interval:  462 QTC Calculation: 472 R Axis:   66  Text Interpretation: Sinus rhythm with 1st degree A-V block Nonspecific T wave abnormality Prolonged QT Confirmed by Lennie Odor [phone])  on 02/20/2024 3:11:45 PM    TTE 09/10/2022 1. Left ventricular ejection fraction, by estimation, is 45 to 50%. The  left ventricle has mildly decreased function. The left ventricle  demonstrates regional wall motion abnormalities (see scoring  diagram/findings for description).   2. Left atrial size was mildly dilated.   3. Mild mitral valve regurgitation.  Physical Exam:   VS:  BP 100/60 (BP Location: Left Arm,  Patient Position: Sitting, Cuff Size: Normal)   Pulse 63   Ht 5' (1.524 m)   Wt 174 lb 9.6 oz (79.2 kg)   SpO2 97%   BMI 34.10 kg/m    Wt Readings from Last 3 Encounters:  02/20/24 174 lb 9.6 oz (79.2 kg)  09/05/23 172 lb 9.6 oz (78.3 kg)  06/27/23 171 lb 6.4 oz (77.7 kg)    GEN: Well nourished, well developed in no acute distress NECK: No JVD; No carotid bruits CARDIAC: RRR, no murmurs, rubs, gallops RESPIRATORY:  Clear to auscultation without rales, wheezing or rhonchi  ABDOMEN: Soft, non-tender, non-distended EXTREMITIES:  No edema; No deformity  ASSESSMENT AND PLAN: .   Assessment and Plan    Heart failure with mid-range ejection fraction Heart failure with EF 45-50%, likely ischemic. Patent LIMA to LAD and vein graft to RCA. Fluid status adequate, normal rhythm. Hypotension requires medication adjustments. - Recheck echocardiogram. - Transition to metoprolol succinate 12.5 mg daily. - Continue losartan 25 mg daily. - Continue Aldactone 25 mg daily. - Monitor kidney function closely. - Discontinue Imdur.   HTN - BP low today. Stop imdur. Continue above medications.   Paroxysmal atrial fibrillation Maintaining sinus rhythm on amiodarone. No recurrence since October 2023. Anticoagulation adjusted for renal function. - Continue amiodarone 200 mg daily. - Reduce Eliquis to 2.5 mg twice daily. - Discontinue aspirin.  Coronary artery disease status post coronary artery bypass grafting (CABG) Post-CABG with patent LIMA to LAD and vein graft to RCA. LDL cholesterol at target. - Continue Lipitor 20 mg daily. - stop ASA since she is on eliquis   Diabetes mellitus Diabetes well-controlled with A1c of 6.7%. - Continue Jardiance 25 mg daily.              Follow-up: Return in about 6 months (around 08/22/2024).  Signed, Lenna Gilford. Flora Lipps, MD, [facility]  [facility]  [address] [place], [place] [address] [phone]  3:59 PM

## 2024-02-20 ENCOUNTER — Encounter: Payer: Self-pay | Admitting: Cardiovascular Disease

## 2024-02-20 ENCOUNTER — Ambulatory Visit: Payer: Medicare Other | Attending: Cardiovascular Disease | Admitting: Cardiovascular Disease

## 2024-02-20 VITALS — BP 100/60 | HR 63 | Ht 60.0 in | Wt 174.6 lb

## 2024-02-20 DIAGNOSIS — I10 Essential (primary) hypertension: Secondary | ICD-10-CM | POA: Diagnosis not present

## 2024-02-20 DIAGNOSIS — I5022 Chronic systolic (congestive) heart failure: Secondary | ICD-10-CM

## 2024-02-20 DIAGNOSIS — I6523 Occlusion and stenosis of bilateral carotid arteries: Secondary | ICD-10-CM

## 2024-02-20 DIAGNOSIS — I2510 Atherosclerotic heart disease of native coronary artery without angina pectoris: Secondary | ICD-10-CM

## 2024-02-20 DIAGNOSIS — I480 Paroxysmal atrial fibrillation: Secondary | ICD-10-CM

## 2024-02-20 DIAGNOSIS — E785 Hyperlipidemia, unspecified: Secondary | ICD-10-CM

## 2024-02-20 MED ORDER — METOPROLOL SUCCINATE ER 25 MG PO TB24: 12.5000 mg | ORAL_TABLET | Freq: Every day | ORAL | 1 refills | Status: AC

## 2024-02-20 MED ORDER — APIXABAN 2.5 MG PO TABS: 2.5000 mg | ORAL_TABLET | Freq: Two times a day (BID) | ORAL | 3 refills | Status: AC

## 2024-02-20 NOTE — Patient Instructions (Signed)
 Medication Instructions:  - STOP ASPIRIN  - STOP IMDUR  - START ELIQUIS 2.5 MG BY MOUTH DAILY  - START METOPROLOL SUCCINATE 12.5 MG BY MOUTH DAILY    *If you need a refill on your cardiac medications before your next appointment, please call your pharmacy*   Lab Work: NONE    If you have labs (blood work) drawn today and your tests are completely normal, you will receive your results only by: MyChart Message (if you have MyChart) OR A paper copy in the mail If you have any lab test that is abnormal or we need to change your treatment, we will call you to review the results.   Testing/Procedures: Echo will be scheduled at [address].  Your physician has requested that you have an echocardiogram. Echocardiography is a painless test that uses sound waves to create images of your heart. It provides your doctor with information about the size and shape of your heart and how well your heart's chambers and valves are working. This procedure takes approximately one hour. There are no restrictions for this procedure. Please do NOT wear cologne, perfume, aftershave, or lotions (deodorant is allowed). Please arrive 15 minutes prior to your appointment time.    Follow-Up: At [facility], you and your health needs are our priority.  As part of our continuing mission to provide you with exceptional heart care, we have created designated Provider Care Teams.  These Care Teams include your primary Cardiologist (physician) and Advanced Practice Providers (APPs -  Physician Assistants and Nurse Practitioners) who all work together to provide you with the care you need, when you need it.  We recommend signing up for the patient portal called "MyChart".  Sign up information is provided on this After Visit Summary.  MyChart is used to connect with patients for Virtual Visits (Telemedicine).  Patients are able to view lab/test results, encounter notes, upcoming appointments, etc.   Non-urgent messages can be sent to your provider as well.   To learn more about what you can do with MyChart, go to [email or web address].    Your next appointment:   6 month(s)  The format for your next appointment:   In Person  Provider:   Edd Fabian, FNP, Micah Flesher, PA-C, Marjie Skiff, PA-C, Robet Leu, PA-C, Juanda Crumble, PA-C, Joni Reining, DNP, ANP, Azalee Course, PA-C, Bernadene Person, NP, or Reather Littler, NP    Then, Reatha Harps, MD will plan to see you again in 1 year(s).   Other Instructions

## 2024-03-02 ENCOUNTER — Ambulatory Visit
Admission: RE | Admit: 2024-03-02 | Discharge: 2024-03-02 | Disposition: A | Discharge status: Home / Self Care | Payer: Medicare Other | Source: Ambulatory Visit | Attending: Internal Medicine | Admitting: Internal Medicine

## 2024-03-02 DIAGNOSIS — R921 Mammographic calcification found on diagnostic imaging of breast: Secondary | ICD-10-CM

## 2024-03-20 ENCOUNTER — Encounter: Payer: Self-pay | Admitting: Cardiovascular Disease

## 2024-03-20 ENCOUNTER — Ambulatory Visit (HOSPITAL_COMMUNITY): Attending: Cardiology

## 2024-03-20 DIAGNOSIS — I5022 Chronic systolic (congestive) heart failure: Secondary | ICD-10-CM | POA: Insufficient documentation

## 2024-03-20 DIAGNOSIS — I2510 Atherosclerotic heart disease of native coronary artery without angina pectoris: Secondary | ICD-10-CM | POA: Insufficient documentation

## 2024-03-20 LAB — ECHOCARDIOGRAM COMPLETE
Area-P 1/2: 3.6 cm2
S' Lateral: 3.1 cm

## 2024-03-25 ENCOUNTER — Other Ambulatory Visit: Payer: Self-pay | Admitting: Cardiovascular Disease

## 2024-03-26 ENCOUNTER — Other Ambulatory Visit: Payer: Self-pay | Admitting: Cardiovascular Disease

## 2024-06-28 ENCOUNTER — Other Ambulatory Visit: Payer: Self-pay | Admitting: Cardiovascular Disease

## 2024-06-30 NOTE — Procedures (Signed)
Mask fit

## 2024-07-24 ENCOUNTER — Other Ambulatory Visit: Payer: Self-pay | Admitting: Cardiovascular Disease

## 2024-09-28 ENCOUNTER — Telehealth: Payer: Self-pay | Admitting: Cardiovascular Disease

## 2024-09-28 ENCOUNTER — Telehealth: Payer: Self-pay

## 2024-09-28 NOTE — Telephone Encounter (Signed)
 I spoke with patient and gave her message from Dr Barbaraann.

## 2024-09-28 NOTE — Telephone Encounter (Signed)
 Pt has the opportunity to do a case study for a heart attack prevention medication with company [employer]. She would like to consult with Dr. Barbaraann about this first. Please advise.

## 2024-09-28 NOTE — Telephone Encounter (Signed)
 OK to participate.    Signed, Darryle DASEN. Barbaraann, MD, [facility]  [facility]  [address] [place], [place] [address] [phone]  2:33 PM  Left message to call office

## 2024-09-28 NOTE — Telephone Encounter (Signed)
 I spoke with patient.  She received a phone call regarding a study by [employer] that is being done in [place].  Patient does not have any printed information to provide.  States study is to look at medication to prevent heart attacks.  She would like to participate in the study but would like to know if Dr Barbaraann knows anything about the study and if he feels it would be OK for her to participate.

## 2024-09-28 NOTE — Telephone Encounter (Signed)
 Received referral from Dr Leita Blind at [facility] [phone]) for sleep apnea management. Placed in sleep mailbox

## 2024-10-20 ENCOUNTER — Ambulatory Visit: Admitting: Physician Assistant

## 2024-10-28 ENCOUNTER — Other Ambulatory Visit: Payer: Self-pay | Admitting: Nephrology

## 2024-10-28 DIAGNOSIS — N184 Chronic kidney disease, stage 4 (severe): Secondary | ICD-10-CM

## 2024-11-15 NOTE — Progress Notes (Unsigned)
 Cardiology Office Note:    Date:  11/18/2024   ID:  Judy [name], DOB 17-Jun-1941, MRN [id number]  PCP:  Stephane Leita VEAR, MD   [facility] Providers Cardiologist:  Darryle ONEIDA Decent, MD     Referring MD: Stephane Leita VEAR, MD   Chief Complaint  Patient presents with   Follow-up    CAD, PAF    History of Present Illness:    Judy [name] is a 83 y.o. female with a hx of CAD s/p CABG x 4, chronic systolic heart failure, hyperlipidemia, history of CVA, paroxysmal atrial fibrillation, chronic anticoagulation, DM.  She was hospitalized 08/2022 with A-fib RVR. Echocardiogram 09/10/2022 demonstrated LVEF 45-50% with WMA.  Left heart catheterization September 2023 demonstrated occluded vein graft to the OM and diagonal branches.  This was managed medically.  She maintained sinus rhythm on 200 mg amlodipine daily.  She is anticoagulated with Eliquis  dose adjusted for age and renal function.  Unfortunately she returned to the ER 09/2022 with recurrent atrial flutter RVR prompting IV amiodarone  for rhythm control.  She had demand ischemia with an elevated troponin.  No known recurrence of A-fib or flutter since October 2023.  Most recent echocardiogram 03/20/2024 showed LVEF 50-55%, grade 1 DD, mild LAE, trivial MR.   She presents back for routine follow up. She is having significant pain in her right leg limiting mobility. She states Dr. Decent was ok with bufferin, she is currently taking 650 mg ASA nightly. I reviewed bleeding and instead advised tylenol .  She is having chest soreness that is tender to palpation. EKG stable from prior. Advised MSK pain.      Past Medical History:  Diagnosis Date   Anxiety    Arthritis    Back pain    Coronary artery disease    Depression    Diabetes (HCC)    Dyspnea    Gout    Heart attack [facility])    March 2021   History of kidney stones    Hyperlipidemia    Hypertension    Leg edema    Pancreatitis, chronic (HCC) 03/2016   Pneumonia due to  COVID-19 virus 12/03/2019   Sleep apnea    C-PAP   Stroke [facility])    March 2021 Rt side weakness    Past Surgical History:  Procedure Laterality Date   BACK SURGERY  2004   BREAST LUMPECTOMY WITH RADIOACTIVE SEED LOCALIZATION Right 10/23/2019   Procedure: RIGHT BREAST LUMPECTOMY WITH RADIOACTIVE SEED LOCALIZATION;  Surgeon: Curvin Deward MOULD, MD;  Location[facility] OR;  Service: General;  Laterality: Right;   CHOLECYSTECTOMY  2001   cardiac event during surgery   COLONOSCOPY  2013   CORONARY ARTERY BYPASS GRAFT N/A 03/03/2020   Procedure: CORONARY ARTERY BYPASS GRAFTING (CABG), ON PUMP, TIMES FIVE, USING LEFT INTERNAL MAMMARY ARTERY AND ENDOSCOPICALLY HARVESTED BILATERAL GREATER SAPHENOUS VEINS -flow track only;  Surgeon: Shyrl Linnie KIDD, MD;  Location: [facility] OR;  Service: Open Heart Surgery;  Laterality: N/A;   LEFT HEART CATH AND CORONARY ANGIOGRAPHY N/A 03/01/2020   Procedure: LEFT HEART CATH AND CORONARY ANGIOGRAPHY;  Surgeon: Claudene Victory ORN, MD;  Location: [facility] INVASIVE CV LAB;  Service: Cardiovascular;  Laterality: N/A;   LEFT HEART CATH AND CORS/GRAFTS ANGIOGRAPHY N/A 08/23/2022   Procedure: LEFT HEART CATH AND CORS/GRAFTS ANGIOGRAPHY;  Surgeon: Jordan, Peter M, MD;  Location: [facility] INVASIVE CV LAB;  Service: Cardiovascular;  Laterality: N/A;   REVERSE SHOULDER ARTHROPLASTY Left 06/21/2022   Procedure: REVERSE SHOULDER ARTHROPLASTY;  Surgeon: Melita Drivers, MD;  Location: [facility] ORS;  Service: Orthopedics;  Laterality: Left;    TEE WITHOUT CARDIOVERSION N/A 03/03/2020   Procedure: TRANSESOPHAGEAL ECHOCARDIOGRAM (TEE);  Surgeon: Shyrl Linnie KIDD, MD;  Location: [facility] OR;  Service: Open Heart Surgery;  Laterality: N/A;    Current Medications: Active Medications[1]   Allergies:   Patient has no known allergies.   Social History   Socioeconomic History   Marital status: Divorced    Spouse name: Not on file   Number of children: 1   Years of education: 12   Highest education level: Some  college, no degree  Occupational History   Occupation: CNA  Tobacco Use   Smoking status: Never   Smokeless tobacco: Never  Vaping Use   Vaping status: Never Used  Substance and Sexual Activity   Alcohol use: No    Alcohol/week: 0.0 standard drinks of alcohol   Drug use: No   Sexual activity: Not Currently  Other Topics Concern   Not on file  Social History Narrative   Lives with Roommate   Drinks 32 ounces of caffeine daily   Right Handed   Social Drivers of Health   Tobacco Use: Low Risk (11/18/2024)   Patient History    Smoking Tobacco Use: Never    Smokeless Tobacco Use: Never    Passive Exposure: Not on file  Financial Resource Strain: Not on file  Food Insecurity: No Food Insecurity (08/22/2022)   Hunger Vital Sign    Worried About Running Out of Food in the Last Year: Never true    Ran Out of Food in the Last Year: Never true  Transportation Needs: No Transportation Needs (08/22/2022)   PRAPARE - Administrator, Civil Service (Medical): No    Lack of Transportation (Non-Medical): No  Physical Activity: Not on file  Stress: Not on file  Social Connections: Not on file  Depression (EYV7-0): Not on file  Alcohol Screen: Not on file  Housing: Low Risk (08/22/2022)   Housing    Last Housing Risk Score: 0  Utilities: Not At Risk (08/22/2022)   AHC Utilities    Threatened with loss of utilities: No  Health Literacy: Not on file     Family History: The patient's family history includes AAA (abdominal aortic aneurysm) in her father; Bladder Cancer in her brother; Breast cancer in her maternal aunt; Dementia in her mother. There is no history of Colon cancer, Stomach cancer, Esophageal cancer, or Pancreatic cancer.  ROS:   Please see the history of present illness.     All other systems reviewed and are negative.  EKGs/Labs/Other Studies Reviewed:    The following studies were reviewed today:  EKG Interpretation Date/Time:  Wednesday November 18 2024  16:13:19 EST Ventricular Rate:  59 PR Interval:  192 QRS Duration:  96 QT Interval:  494 QTC Calculation: 489 R Axis:   -5  Text Interpretation: Sinus bradycardia Cannot rule out Inferior infarct , age undetermined When compared with ECG of 20-Feb-2024 15:09, Minimal criteria for Inferior infarct are now Present Nonspecific T wave abnormality, improved in Lateral leads Confirmed by Madie Slough ([id number]) on 11/18/2024 4:15:50 PM    Recent Labs: No results found for requested labs within last 365 days.  Recent Lipid Panel    Component Value Date/Time   CHOL 111 08/22/2022 0503   CHOL 184 06/19/2018 0958   TRIG 106 08/22/2022 0503   HDL 44 08/22/2022 0503   HDL 45 06/19/2018 0958  CHOLHDL 2.5 08/22/2022 0503   VLDL 21 08/22/2022 0503   LDLCALC 46 08/22/2022 0503   LDLCALC 107 (H) 06/19/2018 0958     Risk Assessment/Calculations:    CHA2DS2-VASc Score = 7   This indicates a 11.2% annual risk of stroke. The patient's score is based upon: CHF History: 1 HTN History: 1 Diabetes History: 1 Stroke History: 0 Vascular Disease History: 1 Age Score: 2 Gender Score: 1         Physical Exam:    VS:  BP (!) 159/74 (BP Location: Left Arm, Patient Position: Sitting, Cuff Size: Large)   Pulse 60   Ht 5' (1.524 m)   Wt 178 lb (80.7 kg)   SpO2 95%   BMI 34.76 kg/m     Wt Readings from Last 3 Encounters:  11/18/24 178 lb (80.7 kg)  02/20/24 174 lb 9.6 oz (79.2 kg)  09/05/23 172 lb 9.6 oz (78.3 kg)     GEN:  Well nourished, well developed in no acute distress HEENT: Normal NECK: No JVD; No carotid bruits LYMPHATICS: No lymphadenopathy CARDIAC: RRR, no murmurs, rubs, gallops RESPIRATORY:  Clear to auscultation without rales, wheezing or rhonchi  ABDOMEN: Soft, non-tender, non-distended MUSCULOSKELETAL:  mild B LE edema; No deformity  SKIN: Warm and dry NEUROLOGIC:  Alert and oriented x 3 PSYCHIATRIC:  Normal affect   ASSESSMENT:    1. Hypertension, unspecified type    2. Coronary artery disease involving native coronary artery of native heart without angina pectoris   3. S/P CABG (coronary artery bypass graft)   4. Heart failure with mildly reduced ejection fraction (HFmrEF) (HCC)   5. Chronic anticoagulation   6. Paroxysmal atrial fibrillation (HCC)   7. Hyperlipidemia LDL goal <70    PLAN:    In order of problems listed above:  CAD s/p CABG x 5: LIMA-LAD, SVG-D1, SVG-OM2-3, SVG-dRCA in 03/2020 - Last heart catheterization 08/2022 with 2 out of 4 occluded vein grafts: Occluded SVG-D1, occluded SVG-OM - She is not on antiplatelet therapy given need for Eliquis  - Continue 12.5 mg Toprol , 25 mg spironolactone , statin -- EKG stable -- chest pain MSK   HFrEF - LVEF 50-55% on recent echocardiogram - GDMT: 25 mg spironolactone , Jardiance , 25 mg losartan , 12.5 mg toprol  -- appears euvolemic, mild B LE edema that is table for her   Hypertension  - managed in the context of CHF - hypertensive today but did not take any medications this morning including losartan , spironolactone , and toprol    PAF - after CABG and again during hospitalization 08/2022 - now on amiodarone  - no recurrence since 2023  - missed eliquis  this morning - confirmed 200 mg amiodarone  daily   Hyperlipidemia with LDL goal < 55 -- no recent lipid panel, do no feel strongly about rechecking    Follow up in 6 months with Dr. Burton.            Medication Adjustments/Labs and Tests Ordered: Current medicines are reviewed at length with the patient today.  Concerns regarding medicines are outlined above.  Orders Placed This Encounter  Procedures   EKG 12-Lead   No orders of the defined types were placed in this encounter.   Patient Instructions  Medication Instructions:  Lasix  40 mg daily as needed for weight gain or swelling  *If you need a refill on your cardiac medications before your next appointment, please call your pharmacy*  Lab Work: NONE  ORDERED   Testing/Procedures: NONE ORDERED  Follow-Up: At [facility], you  and your health needs are our priority.  As part of our continuing mission to provide you with exceptional heart care, our providers are all part of one team.  This team includes your primary Cardiologist (physician) and Advanced Practice Providers or APPs (Physician Assistants and Nurse Practitioners) who all work together to provide you with the care you need, when you need it.  Your next appointment:   6 month(s)  Provider:   Darryle ONEIDA Decent, MD    We recommend signing up for the patient portal called MyChart.  Sign up information is provided on this After Visit Summary.  MyChart is used to connect with patients for Virtual Visits (Telemedicine).  Patients are able to view lab/test results, encounter notes, upcoming appointments, etc.  Non-urgent messages can be sent to your provider as well.   To learn more about what you can do with MyChart, go to [email or web address].              Signed, Jon Nat Hails, [place]  11/18/2024 4:38 PM    [facility]     [1]  Current Meds  Medication Sig   Accu-Chek Softclix Lancets lancets USE TO CHECK BLOOD SUGAR TWICE DAILY   allopurinol (ZYLOPRIM) 100 MG tablet Take 100 mg by mouth daily.   amiodarone  (PACERONE ) 200 MG tablet TAKE 1 TABLET BY MOUTH EVERY DAY   apixaban  (ELIQUIS ) 2.5 MG TABS tablet Take 1 tablet (2.5 mg total) by mouth 2 (two) times daily.   atorvastatin  (LIPITOR ) 20 MG tablet Take 1 tablet (20 mg total) by mouth at bedtime.   Cyanocobalamin  (B-12 COMPLIANCE INJECTION) 1000 MCG/ML KIT Inject 1,000 mcg into the muscle every 30 (thirty) days.   DULoxetine  (CYMBALTA ) 60 MG capsule Take 60 mg by mouth daily.   empagliflozin  (JARDIANCE ) 25 MG TABS tablet Take 25 mg by mouth at bedtime.   furosemide  (LASIX ) 40 MG tablet TAKE 1 TABLET BY MOUTH ONCE DAILY (Patient taking differently: Take 40 mg by mouth daily as needed for edema  or fluid.)   glucose blood (ACCU-CHEK AVIVA) test strip Test twice daily   losartan  (COZAAR ) 25 MG tablet TAKE 1 TABLET BY MOUTH EVERY DAY AT BEDTIME   metFORMIN  (GLUCOPHAGE -XR) 750 MG 24 hr tablet Take 750 mg by mouth daily with breakfast.   metoprolol  succinate (TOPROL  XL) 25 MG 24 hr tablet Take 0.5 tablets (12.5 mg total) by mouth daily.   NON FORMULARY 1 each by Other route See admin instructions. CPAP machine nightly   spironolactone  (ALDACTONE ) 25 MG tablet TAKE 1 TABLET BY MOUTH ONCE DAILY   traMADol  (ULTRAM ) 50 MG tablet Take 50 mg by mouth 2 (two) times daily.   traZODone  (DESYREL ) 100 MG tablet Take 150 mg by mouth at bedtime.

## 2024-11-18 ENCOUNTER — Ambulatory Visit: Attending: Physician Assistant | Admitting: Physician Assistant

## 2024-11-18 ENCOUNTER — Encounter: Payer: Self-pay | Admitting: Physician Assistant

## 2024-11-18 VITALS — BP 159/74 | HR 60 | Ht 60.0 in | Wt 178.0 lb

## 2024-11-18 DIAGNOSIS — Z951 Presence of aortocoronary bypass graft: Secondary | ICD-10-CM | POA: Diagnosis not present

## 2024-11-18 DIAGNOSIS — I5020 Unspecified systolic (congestive) heart failure: Secondary | ICD-10-CM

## 2024-11-18 DIAGNOSIS — I480 Paroxysmal atrial fibrillation: Secondary | ICD-10-CM | POA: Diagnosis not present

## 2024-11-18 DIAGNOSIS — I2510 Atherosclerotic heart disease of native coronary artery without angina pectoris: Secondary | ICD-10-CM | POA: Diagnosis not present

## 2024-11-18 DIAGNOSIS — I10 Essential (primary) hypertension: Secondary | ICD-10-CM

## 2024-11-18 DIAGNOSIS — Z7901 Long term (current) use of anticoagulants: Secondary | ICD-10-CM

## 2024-11-18 DIAGNOSIS — E785 Hyperlipidemia, unspecified: Secondary | ICD-10-CM | POA: Diagnosis not present

## 2024-11-18 NOTE — Patient Instructions (Signed)
 Medication Instructions:  Lasix  40 mg daily as needed for weight gain or swelling  *If you need a refill on your cardiac medications before your next appointment, please call your pharmacy*  Lab Work: NONE ORDERED   Testing/Procedures: NONE ORDERED  Follow-Up: At [facility], you and your health needs are our priority.  As part of our continuing mission to provide you with exceptional heart care, our providers are all part of one team.  This team includes your primary Cardiologist (physician) and Advanced Practice Providers or APPs (Physician Assistants and Nurse Practitioners) who all work together to provide you with the care you need, when you need it.  Your next appointment:   6 month(s)  Provider:   Darryle ONEIDA Decent, MD    We recommend signing up for the patient portal called MyChart.  Sign up information is provided on this After Visit Summary.  MyChart is used to connect with patients for Virtual Visits (Telemedicine).  Patients are able to view lab/test results, encounter notes, upcoming appointments, etc.  Non-urgent messages can be sent to your provider as well.   To learn more about what you can do with MyChart, go to [email or web address].

## 2024-11-27 ENCOUNTER — Encounter (HOSPITAL_BASED_OUTPATIENT_CLINIC_OR_DEPARTMENT_OTHER): Payer: Self-pay

## 2024-11-27 ENCOUNTER — Other Ambulatory Visit: Payer: Self-pay

## 2024-11-27 ENCOUNTER — Emergency Department (HOSPITAL_BASED_OUTPATIENT_CLINIC_OR_DEPARTMENT_OTHER)
Admission: EM | Admit: 2024-11-27 | Discharge: 2024-11-28 | Disposition: A | Discharge status: Home / Self Care | Attending: Emergency Medicine | Admitting: Emergency Medicine

## 2024-11-27 ENCOUNTER — Emergency Department (HOSPITAL_BASED_OUTPATIENT_CLINIC_OR_DEPARTMENT_OTHER): Admitting: Radiology

## 2024-11-27 DIAGNOSIS — I4891 Unspecified atrial fibrillation: Secondary | ICD-10-CM | POA: Diagnosis not present

## 2024-11-27 DIAGNOSIS — F32A Depression, unspecified: Secondary | ICD-10-CM | POA: Diagnosis not present

## 2024-11-27 DIAGNOSIS — E785 Hyperlipidemia, unspecified: Secondary | ICD-10-CM | POA: Insufficient documentation

## 2024-11-27 DIAGNOSIS — Z951 Presence of aortocoronary bypass graft: Secondary | ICD-10-CM | POA: Diagnosis not present

## 2024-11-27 DIAGNOSIS — Z7901 Long term (current) use of anticoagulants: Secondary | ICD-10-CM | POA: Diagnosis not present

## 2024-11-27 DIAGNOSIS — E669 Obesity, unspecified: Secondary | ICD-10-CM | POA: Diagnosis not present

## 2024-11-27 DIAGNOSIS — I2510 Atherosclerotic heart disease of native coronary artery without angina pectoris: Secondary | ICD-10-CM | POA: Diagnosis not present

## 2024-11-27 DIAGNOSIS — R0602 Shortness of breath: Secondary | ICD-10-CM | POA: Diagnosis present

## 2024-11-27 DIAGNOSIS — M5430 Sciatica, unspecified side: Secondary | ICD-10-CM

## 2024-11-27 DIAGNOSIS — I10 Essential (primary) hypertension: Secondary | ICD-10-CM | POA: Diagnosis not present

## 2024-11-27 DIAGNOSIS — Z79899 Other long term (current) drug therapy: Secondary | ICD-10-CM | POA: Diagnosis not present

## 2024-11-27 DIAGNOSIS — M5431 Sciatica, right side: Secondary | ICD-10-CM | POA: Insufficient documentation

## 2024-11-27 DIAGNOSIS — R0789 Other chest pain: Secondary | ICD-10-CM | POA: Insufficient documentation

## 2024-11-27 DIAGNOSIS — G4733 Obstructive sleep apnea (adult) (pediatric): Secondary | ICD-10-CM | POA: Diagnosis not present

## 2024-11-27 DIAGNOSIS — R079 Chest pain, unspecified: Secondary | ICD-10-CM

## 2024-11-27 DIAGNOSIS — Z7984 Long term (current) use of oral hypoglycemic drugs: Secondary | ICD-10-CM | POA: Diagnosis not present

## 2024-11-27 DIAGNOSIS — E119 Type 2 diabetes mellitus without complications: Secondary | ICD-10-CM | POA: Diagnosis not present

## 2024-11-27 DIAGNOSIS — F419 Anxiety disorder, unspecified: Secondary | ICD-10-CM | POA: Insufficient documentation

## 2024-11-27 DIAGNOSIS — Z6834 Body mass index (BMI) 34.0-34.9, adult: Secondary | ICD-10-CM | POA: Diagnosis not present

## 2024-11-27 LAB — BASIC METABOLIC PANEL WITH GFR
Anion gap: 12 (ref 5–15)
BUN: 24 mg/dL — ABNORMAL HIGH (ref 8–23)
CO2: 27 mmol/L (ref 22–32)
Calcium: 9.1 mg/dL (ref 8.9–10.3)
Chloride: 103 mmol/L (ref 98–111)
Creatinine, Ser: 1.6 mg/dL — ABNORMAL HIGH (ref 0.44–1.00)
GFR, Estimated: 32 mL/min — ABNORMAL LOW
Glucose, Bld: 148 mg/dL — ABNORMAL HIGH (ref 70–99)
Potassium: 3.6 mmol/L (ref 3.5–5.1)
Sodium: 142 mmol/L (ref 135–145)

## 2024-11-27 LAB — CBC
HCT: 40.1 % (ref 36.0–46.0)
Hemoglobin: 13 g/dL (ref 12.0–15.0)
MCH: 30.1 pg (ref 26.0–34.0)
MCHC: 32.4 g/dL (ref 30.0–36.0)
MCV: 92.8 fL (ref 80.0–100.0)
Platelets: 222 K/uL (ref 150–400)
RBC: 4.32 MIL/uL (ref 3.87–5.11)
RDW: 13 % (ref 11.5–15.5)
WBC: 7.1 K/uL (ref 4.0–10.5)
nRBC: 0 % (ref 0.0–0.2)

## 2024-11-27 LAB — TROPONIN T, HIGH SENSITIVITY
Troponin T High Sensitivity: 18 ng/L (ref 0–19)
Troponin T High Sensitivity: 17 ng/L (ref 0–19)

## 2024-11-27 NOTE — ED Provider Notes (Signed)
 " [place] [facility]ERGENC[facility] DEPARTMENT AT [facility] Provider Note   CSN: [id number] Arrival date & time: 11/27/24  1642     Patient presents with: Arm Pain   Judy [name] is a 83 y.o. female.    Arm Pain     83 year old female with medical history significant for HTN, atrial fibrillation on amiodarone  and Eliquis , cerebral thrombosis, CAD status post CABG, OSA on CPAP, DM2, HLD, anxiety, depression, obesity, presenting to the emergency department with multiple complaints.  The patient states that she developed pain in the left side of her chest radiating down her left arm earlier this morning.  Patient states that pain has since eased off and she is asymptomatic but she wanted to get evaluated.  She has had intermittent chest pain for the past few days.  She also endorses pain from her right hip radiating down her right leg.  Pain is worse with twisting and movement.  She has been able to ambulate with a cane.  She denies any recent falls or trauma.   No IV drug use.  No numbness or weakness in the groin/saddle anesthesia, she denies any focal numbness or weakness in the lower extremities.  She endorses mild shortness of breath associated with her chest discomfort.  No known sick contacts.  No cough.  No fever or chills.  Prior to Admission medications  Medication Sig Start Date End Date Taking? Authorizing Provider  HYDROcodone -acetaminophen  (NORCO/VICODIN) 5-325 MG tablet Take 1 tablet by mouth every 6 (six) hours as needed. 11/28/24  Yes Jerrol Agent, MD  Accu-Chek Softclix Lancets lancets USE TO CHECK BLOOD SUGAR TWICE DAILY 05/11/21   Brien Belvie BRAVO, MD  allopurinol (ZYLOPRIM) 100 MG tablet Take 100 mg by mouth daily.    [provider]  amiodarone  (PACERONE ) 200 MG tablet TAKE 1 TABLET BY MOUTH EVERY DAY 07/24/24   O'Neal, Darryle Ned, MD  apixaban  (ELIQUIS ) 2.5 MG TABS tablet Take 1 tablet (2.5 mg total) by mouth 2 (two) times daily. 02/20/24   O'NealDarryle Ned, MD  atorvastatin  (LIPITOR ) 20 MG tablet Take 1 tablet (20 mg total) by mouth at bedtime. 05/04/20   O'Neal, Darryle Ned, MD  Cyanocobalamin  (B-12 COMPLIANCE INJECTION) 1000 MCG/ML KIT Inject 1,000 mcg into the muscle every 30 (thirty) days.    [provider]  DULoxetine  (CYMBALTA ) 60 MG capsule Take 60 mg by mouth daily.    [provider]  empagliflozin  (JARDIANCE ) 25 MG TABS tablet Take 25 mg by mouth at bedtime.    [provider]  furosemide  (LASIX ) 40 MG tablet TAKE 1 TABLET BY MOUTH ONCE DAILY Patient taking differently: Take 40 mg by mouth daily as needed for edema or fluid. 03/26/24   O'NealDarryle Ned, MD  glucose blood (ACCU-CHEK AVIVA) test strip Test twice daily 04/04/20   Brien Belvie BRAVO, MD  losartan  (COZAAR ) 25 MG tablet TAKE 1 TABLET BY MOUTH EVERY DAY AT BEDTIME 03/26/24   O'Neal, Darryle Ned, MD  metFORMIN  (GLUCOPHAGE -XR) 750 MG 24 hr tablet Take 750 mg by mouth daily with breakfast. 10/11/20   [provider]  metoprolol  succinate (TOPROL  XL) 25 MG 24 hr tablet Take 0.5 tablets (12.5 mg total) by mouth daily. 02/20/24   O'Neal, Darryle Ned, MD  NON FORMULARY 1 each by Other route See admin instructions. CPAP machine nightly    [provider]  spironolactone  (ALDACTONE ) 25 MG tablet TAKE 1 TABLET BY MOUTH ONCE DAILY 06/30/24   O'Neal, Darryle Ned, MD  traMADol  (ULTRAM ) 50 MG tablet Take 50 mg by mouth 2 (two) times daily. 10/16/22   [provider]  traZODone  (DESYREL ) 100 MG tablet Take 150 mg by mouth at bedtime. 04/28/20   [provider]    Allergies: Patient has no known allergies.    Review of Systems  All other systems reviewed and are negative.   Updated Vital Signs BP (!) 142/59 (BP Location: Left Arm)   Pulse 68   Temp 98.4 F (36.9 C) (Oral)   Resp 20   Ht 5' (1.524 m)   Wt 79.4 kg   SpO2 95%   BMI 34.18 kg/m   Physical Exam Vitals and nursing note reviewed.  Constitutional:       General: She is not in acute distress.    Appearance: She is well-developed. She is obese.  HENT:     Head: Normocephalic and atraumatic.  Eyes:     Conjunctiva/sclera: Conjunctivae normal.  Cardiovascular:     Rate and Rhythm: Normal rate and regular rhythm.  Pulmonary:     Effort: Pulmonary effort is normal. No respiratory distress.     Breath sounds: Normal breath sounds.  Abdominal:     Palpations: Abdomen is soft.     Tenderness: There is no abdominal tenderness.  Musculoskeletal:        General: No swelling.     Cervical back: Neck supple.     Right lower leg: No edema.     Left lower leg: No edema.     Comments: Positive straight leg raise test on the right, no midline tenderness of the cervical, thoracic or lumbar spine  Skin:    General: Skin is warm and dry.     Capillary Refill: Capillary refill takes less than 2 seconds.  Neurological:     General: No focal deficit present.     Mental Status: She is alert and oriented to person, place, and time. Mental status is at baseline.     Comments: 5 out of 5 strength in all 4 extremities with intact sensation to light touch.  No cranial nerve deficit  Psychiatric:        Mood and Affect: Mood normal.     (all labs ordered are listed, but only abnormal results are displayed) Labs Reviewed  BASIC METABOLIC PANEL WITH GFR - Abnormal; Notable for the following components:      Result Value   Glucose, Bld 148 (*)    BUN 24 (*)    Creatinine, Ser 1.60 (*)    GFR, Estimated 32 (*)    All other components within normal limits  RESP PANEL BY RT-PCR (RSV, FLU A&B, COVID)  RVPGX2  CBC  PRO BRAIN NATRIURETIC PEPTIDE  D-DIMER, QUANTITATIVE  TROPONIN T, HIGH SENSITIVITY  TROPONIN T, HIGH SENSITIVITY    EKG: EKG Interpretation Date/Time:  Friday November 27 2024 16:51:04 EST Ventricular Rate:  62 PR Interval:  198 QRS Duration:  92 QT Interval:  354 QTC Calculation: 359 R Axis:   79  Text Interpretation: Normal  sinus rhythm Nonspecific T wave abnormality Abnormal ECG When compared with ECG of 18-Nov-2024 16:13, Minimal criteria for Inferior infarct are no longer Present Nonspecific T wave abnormality, worse in Lateral leads QT has shortened Confirmed by Jerrol Agent ([id number]) on 11/28/2024 12:12:54 AM  Radiology: DG Chest 2 View Result Date: 11/27/2024 CLINICAL DATA:  Chest pain. EXAM: CHEST - 2 VIEW COMPARISON:  Chest radiograph dated 09/09/2022. FINDINGS: Cardiomegaly, unchanged. Prior median sternotomy and CABG. No  overt pulmonary edema, focal consolidation, pleural effusion, or pneumothorax. Left reverse shoulder arthroplasty. No acute osseous abnormality. IMPRESSION: 1. No acute cardiopulmonary findings. 2. Cardiomegaly. Electronically Signed   By: Harrietta Sherry M.D.   On: 11/27/2024 18:38     Procedures   Medications Ordered in the ED  HYDROcodone -acetaminophen  (NORCO/VICODIN) 5-325 MG per tablet 1 tablet (1 tablet Oral Given 11/28/24 0036)  acetaminophen  (TYLENOL ) tablet 650 mg (650 mg Oral Given 11/28/24 0036)                                    Medical Decision Making Amount and/or Complexity of Data Reviewed Labs: ordered. Radiology: ordered.  Risk OTC drugs. Prescription drug management.    83 year old female with medical history significant for HTN, atrial fibrillation on amiodarone  and Eliquis , cerebral thrombosis, CAD status post CABG, OSA on CPAP, DM2, HLD, anxiety, depression, obesity, presenting to the emergency department with multiple complaints.  The patient states that she developed pain in the left side of her chest radiating down her left arm earlier this morning.  Patient states that pain has since eased off and she is asymptomatic but she wanted to get evaluated.  She has had intermittent chest pain for the past few days.  She also endorses pain from her right hip radiating down her right leg.  Pain is worse with twisting and movement.  She has been able to ambulate  with a cane.  She denies any recent falls or trauma.   No IV drug use.  No numbness or weakness in the groin/saddle anesthesia, she denies any focal numbness or weakness in the lower extremities.  She endorses mild shortness of breath associated with her chest discomfort.  No known sick contacts.  No cough.  No fever or chills.  On arrival, the patient was afebrile, not tachycardic or tachypneic, mildly hypertensive BP 141/64, saturating 100% on room air.  On exam, the patient had an intact neurologic exam.  She had a positive straight leg raise test on the right.  She had no midline tenderness of the spine.  She denies any recent falls or trauma.  She is without episodes of urinary or fecal incontinence.  She endorses complete sensation of the perineum.  She has reassuring vital signs with no obvious physical abnormality or injury on exam.  She denies any recent trauma and is afebrile and denies IV drug abuse. Lungs were CTAB on exam.    Initial Assessment:   With the patient's presentation of acute back pain in the above setting, most likely diagnosis is discogenic back pain. Other diagnoses were considered including (but not limited to) underlying fracture, epidural hematoma, cauda equina syndrome, spinal stenosis, spinal malignancy. These are considered less likely due to history of present illness and physical exam findings.   In particular, lack of fever, substantial history of IV drug use, or substantial neurologic abnormality is less consistent with epidural abscess versus discitis or other spinal infection.   With the patient's presentation of left-sided chest pain, most likely diagnosis is musculoskeletal chest pain versus GERD, although ACS remains on the differential. Other diagnoses were considered including (but not limited to) pulmonary embolism, community-acquired pneumonia, aortic dissection, pneumothorax, underlying bony abnormality, anemia. These are considered less likely due to  history of present illness and physical exam findings.    In particular, concerning pulmonary embolism: Patient is PERC positive and the they deny malignancy, recent surgery, history  of DVT, or calf tenderness leading to a low risk Wells score. Aortic Dissection also considered but seems less likely based on the location, quality, onset, and severity of symptoms in this case.   Initial Plan: Evaluate for ACS with delta troponin and EKG evaluated as below  Evaluate for dissection, bony abnormality, or pneumonia with chest x-ray and screening laboratory evaluation including CBC, BMP  Further evaluation for pulmonary embolism indicated at this time based on patient's PERC and Wells score although less likely as the patient is anticoagulated  Further evaluation for Thoracic Aortic Dissection not indicated at this time based on patient's clinical history and PE findings.   Initial Study Results: EKG was reviewed independently. Rate, rhythm, axis, intervals all examined and without medically relevant abnormality. ST segments without concerns for elevations.    Laboratory  Delta troponin was normal Dimer negative CBC without a leukocytosis or anemia BMP with Cr close to baseline at 1.6 Covid/Flu/RSV negative  Radiology  DG Chest 2 View Result Date: 11/27/2024 CLINICAL DATA:  Chest pain. EXAM: CHEST - 2 VIEW COMPARISON:  Chest radiograph dated 09/09/2022. FINDINGS: Cardiomegaly, unchanged. Prior median sternotomy and CABG. No overt pulmonary edema, focal consolidation, pleural effusion, or pneumothorax. Left reverse shoulder arthroplasty. No acute osseous abnormality. IMPRESSION: 1. No acute cardiopulmonary findings. 2. Cardiomegaly. Electronically Signed   By: Harrietta Sherry M.D.   On: 11/27/2024 18:38    Final Assessment and Plan: The patient was administered Percocet for pain control in addition to acetaminophen .  On repeat evaluation she was asymptomatic and feeling symptomatically improved.   She was able to ambulate at her baseline with the assistance of a cane.  Suspect likely sciatica as the etiology of the patient's back discomfort, low concern for trauma, no midline tenderness, no reported trauma by the patient, neurologically intact without evidence of cauda equina syndrome.  Patient chest pain appears to is resolved and she has normal delta troponins, negative D-dimer, overall reassuring EKG, chest x-ray and laboratory findings.  Patient advised to follow-up outpatient with her PCP, overall stable for discharge at this time for continued outpatient management.  Return precautions provided.     Emergency Department Medication Summary:   Medications  HYDROcodone -acetaminophen  (NORCO/VICODIN) 5-325 MG per tablet 1 tablet (1 tablet Oral Given 11/28/24 0036)  acetaminophen  (TYLENOL ) tablet 650 mg (650 mg Oral Given 11/28/24 0036)         Final diagnoses:  Sciatic leg pain  Chest pain, unspecified type    ED Discharge Orders          Ordered    HYDROcodone -acetaminophen  (NORCO/VICODIN) 5-325 MG tablet  Every 6 hours PRN        11/28/24 0145               Jerrol Agent, MD 11/28/24 1915  "

## 2024-11-27 NOTE — ED Triage Notes (Signed)
 Pt reports L arm pain starting this AM. Pt denies any injury or trauma. Pt reports intermittent chest pain for the last few days. Pt also endorses R hip pain. Pt also reports SOB.

## 2024-11-28 LAB — RESP PANEL BY RT-PCR (RSV, FLU A&B, COVID)  RVPGX2
Influenza A by PCR: NEGATIVE
Influenza B by PCR: NEGATIVE
Resp Syncytial Virus by PCR: NEGATIVE
SARS Coronavirus 2 by RT PCR: NEGATIVE

## 2024-11-28 LAB — PRO BRAIN NATRIURETIC PEPTIDE: Pro Brain Natriuretic Peptide: 207 pg/mL

## 2024-11-28 LAB — D-DIMER, QUANTITATIVE: D-Dimer, Quant: 0.5 ug{FEU}/mL (ref 0.00–0.50)

## 2024-11-28 MED ORDER — ACETAMINOPHEN 325 MG PO TABS
650.0000 mg | ORAL_TABLET | Freq: Once | ORAL | Status: AC
  Administered 2024-11-28: 650 mg via ORAL
  Filled 2024-11-28: qty 2

## 2024-11-28 MED ORDER — HYDROCODONE-ACETAMINOPHEN 5-325 MG PO TABS: 1.0000 | ORAL_TABLET | Freq: Four times a day (QID) | ORAL | 0 refills | Status: AC | PRN

## 2024-11-28 MED ORDER — HYDROCODONE-ACETAMINOPHEN 5-325 MG PO TABS
1.0000 | ORAL_TABLET | Freq: Once | ORAL | Status: AC
  Administered 2024-11-28: 1 via ORAL
  Filled 2024-11-28: qty 1

## 2024-11-28 NOTE — Discharge Instructions (Addendum)
 Your workup to include EKG, chest x-ray and laboratory evaluation was overall reassuring pointing away from an issue with your heart regarding your chest discomfort.  Regarding your leg pain, your symptoms are most consistent with sciatica.  A short course of pain medication has been prescribed.

## 2024-11-28 NOTE — ED Notes (Signed)
 Officer assisted pt to parking lot via wc at pt's request. Pt left with all d/c instructions, verbalized understanding of same and had all belongings at time of dc.

## 2024-11-28 NOTE — ED Notes (Addendum)
 Lab add on dimer & BNP

## 2024-11-28 NOTE — ED Notes (Signed)
 Pt ambulated in hall to restroom with use of personal cane. Ambulated with an altered gait as does at baseline. Denies pain, MD Lawsing made aware.

## 2024-11-30 ENCOUNTER — Encounter: Payer: Self-pay | Admitting: Neurology

## 2024-11-30 ENCOUNTER — Ambulatory Visit: Admitting: Neurology

## 2024-11-30 VITALS — BP 151/59 | HR 60 | Ht 60.0 in | Wt 180.6 lb

## 2024-11-30 DIAGNOSIS — G4733 Obstructive sleep apnea (adult) (pediatric): Secondary | ICD-10-CM | POA: Diagnosis not present

## 2024-11-30 DIAGNOSIS — G4700 Insomnia, unspecified: Secondary | ICD-10-CM

## 2024-11-30 NOTE — Progress Notes (Signed)
 Subjective:    Patient ID: Judy [name] is a 83 y.o. female.  HPI    True Mar, MD, PhD [facility] [address] [address] [place], [place] [address]  Dear Judy [name],  I saw your patient, Judy [name], upon your kind request in my sleep clinic today for initial consultation of her sleep disorder, in particular, evaluation of her prior diagnosis of obstructive sleep apnea.  The patient is unaccompanied today.  As you know, Judy [name] is an 83 year old female with an underlying medical history of arthritis, back pain, coronary artery disease, with history of MI, status post CABG in 2021, Paroxysmal atrial fibrillation, carotid artery stenosis, chronic systolic congestive heart failure,depression, anxiety, diabetes, gout, history of kidney stones, hypertension, hyperlipidemia, leg edema, chronic pancreatitis, history of stroke, memory loss (for which she had seen Dr. Ines in this clinic in 2021 and 2024), and obesity, who reports a longstanding history of obstructive sleep apnea and excessive daytime somnolence.  She was previously diagnosed with obstructive sleep apnea and placed on PAP therapy.  Prior testing was in 2021 I reviewed her sleep study report from 03/27/2020.  Her total AHI was 15.5/h, O2 nadir 89% with absence of REM sleep.  She was followed by [facility] pulmonology for her sleep apnea.    Her Epworth sleepiness score is 12 out of 24, fatigue severity score is 37 out of 63.  She reports that she was originally diagnosed with obstructive sleep apnea some 40 years ago.  I was able to review her AutoPap compliance data for the past 90 days, she has been on her machine consistently every night with an average usage of 7 hours and 20 minutes, residual AHI 4.3/h, leak on the higher side with the 95th percentile at 24.1 L/min on a pressure of 5 to 15 cm with EPR of 3, 95th percentile of pressure at 13.1 cm.  Her DME company is [employer].  She reports that she  could not get any recent new supplies.  She has been using new supplies from a friend.  She had a set up date on this machine of 04/19/2020.  She reports difficulty initiating and maintaining sleep for years.  She has been on trazodone  100 mg at bedtime.  Bedtime is generally between 12 and 1 and rise time around 9.  She lives with a roommate, they have separate bedrooms.  She does not have a pet herself but her roommate has a small dog.  She has a TV in her bedroom and watches some at night, tries to turn off the TV between 10:30 PM and 11.  She does not drink any caffeine daily.  She is a non-smoker and does not drink any alcohol.  She denies nightly nocturia or recurrent nocturnal or morning headaches.  She had a tonsillectomy as a child.  Her Past Medical History Is Significant For: Past Medical History:  Diagnosis Date   Anxiety    Arthritis    Back pain    Coronary artery disease    Depression    Diabetes (HCC)    Dyspnea    Gout    Heart attack [facility])    March 2021   History of kidney stones    Hyperlipidemia    Hypertension    Leg edema    Pancreatitis, chronic (HCC) 03/2016   Pneumonia due to COVID-19 virus 12/03/2019   Sleep apnea    C-PAP   Stroke [facility])    March 2021 Rt  side weakness    Her Past Surgical History Is Significant For: Past Surgical History:  Procedure Laterality Date   BACK SURGERY  2004   BREAST LUMPECTOMY WITH RADIOACTIVE SEED LOCALIZATION Right 10/23/2019   Procedure: RIGHT BREAST LUMPECTOMY WITH RADIOACTIVE SEED LOCALIZATION;  Surgeon: Judy Deward MOULD, MD;  Location: [facility] OR;  Service: General;  Laterality: Right;   CHOLECYSTECTOMY  2001   cardiac event during surgery   COLONOSCOPY  2013   CORONARY ARTERY BYPASS GRAFT N/A 03/03/2020   Procedure: CORONARY ARTERY BYPASS GRAFTING (CABG), ON PUMP, TIMES FIVE, USING LEFT INTERNAL MAMMARY ARTERY AND ENDOSCOPICALLY HARVESTED BILATERAL GREATER SAPHENOUS VEINS -flow track only;  Surgeon: Judy Linnie KIDD, MD;   Location: [facility] OR;  Service: Open Heart Surgery;  Laterality: N/A;   LEFT HEART CATH AND CORONARY ANGIOGRAPHY N/A 03/01/2020   Procedure: LEFT HEART CATH AND CORONARY ANGIOGRAPHY;  Surgeon: Judy Victory ORN, MD;  Location: [facility] INVASIVE CV LAB;  Service: Cardiovascular;  Laterality: N/A;   LEFT HEART CATH AND CORS/GRAFTS ANGIOGRAPHY N/A 08/23/2022   Procedure: LEFT HEART CATH AND CORS/GRAFTS ANGIOGRAPHY;  Surgeon: Jordan, Peter M, MD;  Location: [facility] INVASIVE CV LAB;  Service: Cardiovascular;  Laterality: N/A;   REVERSE SHOULDER ARTHROPLASTY Left 06/21/2022   Procedure: REVERSE SHOULDER ARTHROPLASTY;  Surgeon: Judy Drivers, MD;  Location: [facility] ORS;  Service: Orthopedics;  Laterality: Left;    TEE WITHOUT CARDIOVERSION N/A 03/03/2020   Procedure: TRANSESOPHAGEAL ECHOCARDIOGRAM (TEE);  Surgeon: Judy Linnie KIDD, MD;  Location: [facility] OR;  Service: Open Heart Surgery;  Laterality: N/A;    Her Family History Is Significant For: Family History  Problem Relation Age of Onset   Dementia Mother    AAA (abdominal aortic aneurysm) Father    Bladder Cancer Brother    Breast cancer Maternal Aunt    Colon cancer Neg Hx    Stomach cancer Neg Hx    Esophageal cancer Neg Hx    Pancreatic cancer Neg Hx    Migraines Neg Hx    Seizures Neg Hx    Stroke Neg Hx    Sleep apnea Neg Hx     Her Social History Is Significant For: Social History   Socioeconomic History   Marital status: Divorced    Spouse name: Not on file   Number of children: 1   Years of education: 12   Highest education level: Some college, no degree  Occupational History   Occupation: CNA  Tobacco Use   Smoking status: Never   Smokeless tobacco: Never  Vaping Use   Vaping status: Never Used  Substance and Sexual Activity   Alcohol use: No    Alcohol/week: 0.0 standard drinks of alcohol   Drug use: No   Sexual activity: Not Currently  Other Topics Concern   Not on file  Social History Narrative   Lives with Roommate    Drinks 1 zero sprite or coke weekly    Right Handed   Social [name]   Tobacco Use: Low Risk (11/30/2024)   Patient History    Smoking Tobacco Use: Never    Smokeless Tobacco Use: Never    Passive Exposure: Not on file  Financial Resource Strain: Not on file  Food Insecurity: No Food Insecurity (08/22/2022)   Hunger Vital Sign    Worried About Running Out of Food in the Last Year: Never true    Ran Out of Food in the Last Year: Never true  Transportation Needs: No Transportation Needs (08/22/2022)   PRAPARE -  Administrator, Civil Service (Medical): No    Lack of Transportation (Non-Medical): No  Physical Activity: Not on file  Stress: Not on file  Social Connections: Not on file  Depression (EYV7-0): Not on file  Alcohol Screen: Not on file  Housing: Low Risk (08/22/2022)   Housing    Last Housing Risk Score: 0  Utilities: Not At Risk (08/22/2022)   AHC Utilities    Threatened with loss of utilities: No  Health Literacy: Not on file    Her Allergies Are:  Allergies[1]:   Her Current Medications Are:  Outpatient Encounter Medications as of 11/30/2024  Medication Sig   Accu-Chek Softclix Lancets lancets USE TO CHECK BLOOD SUGAR TWICE DAILY   allopurinol (ZYLOPRIM) 100 MG tablet Take 100 mg by mouth daily.   amiodarone  (PACERONE ) 200 MG tablet TAKE 1 TABLET BY MOUTH EVERY DAY   apixaban  (ELIQUIS ) 2.5 MG TABS tablet Take 1 tablet (2.5 mg total) by mouth 2 (two) times daily.   atorvastatin  (LIPITOR ) 20 MG tablet Take 1 tablet (20 mg total) by mouth at bedtime.   Cyanocobalamin  (B-12 COMPLIANCE INJECTION) 1000 MCG/ML KIT Inject 1,000 mcg into the muscle every 30 (thirty) days.   DULoxetine  (CYMBALTA ) 60 MG capsule Take 60 mg by mouth daily.   empagliflozin  (JARDIANCE ) 25 MG TABS tablet Take 25 mg by mouth at bedtime.   furosemide  (LASIX ) 40 MG tablet TAKE 1 TABLET BY MOUTH ONCE DAILY (Patient taking differently: Take 40 mg by mouth daily as needed for edema  or fluid.)   glucose blood (ACCU-CHEK AVIVA) test strip Test twice daily   HYDROcodone -acetaminophen  (NORCO/VICODIN) 5-325 MG tablet Take 1 tablet by mouth every 6 (six) hours as needed.   hydroxychloroquine (PLAQUENIL) 200 MG tablet Take 200 mg by mouth daily.   losartan  (COZAAR ) 25 MG tablet TAKE 1 TABLET BY MOUTH EVERY DAY AT BEDTIME   metFORMIN  (GLUCOPHAGE -XR) 750 MG 24 hr tablet Take 750 mg by mouth daily with breakfast.   metoprolol  succinate (TOPROL  XL) 25 MG 24 hr tablet Take 0.5 tablets (12.5 mg total) by mouth daily.   NON FORMULARY 1 each by Other route See admin instructions. CPAP machine nightly   spironolactone  (ALDACTONE ) 25 MG tablet TAKE 1 TABLET BY MOUTH ONCE DAILY   traMADol  (ULTRAM ) 50 MG tablet Take 50 mg by mouth 2 (two) times daily.   traZODone  (DESYREL ) 100 MG tablet Take 150 mg by mouth at bedtime.   No facility-administered encounter medications on file as of 11/30/2024.  :   Review of Systems:  Out of a complete 14 point review of systems, all are reviewed and negative with the exception of these symptoms as listed below:   Review of Systems  Objective:  Neurological Exam  Physical Exam Physical Examination:   Vitals:   11/30/24 1430  BP: (!) 151/59  Pulse: 60  SpO2: 96%    General Examination: The patient is a very pleasant 83 y.o. female in no acute distress. She appears well-developed and well-nourished and well groomed.   HEENT: Normocephalic, atraumatic, pupils are equal, round and reactive to light, extraocular tracking is good without limitation to gaze excursion or nystagmus noted. No photophobia.  Hearing is grossly intact.  Face is symmetric with normal facial animation. Speech is clear without dysarthria. There is no hypophonia. There is no lip, neck/head, jaw or voice tremor. Neck is supple with full range of passive and active motion. There are no carotid bruits on auscultation.  Airway/Oropharynx exam reveals: No  significant airway  crowding.  No carotid bruits.  Neck with full range of motion.    Chest: Clear to auscultation without wheezing, rhonchi or crackles noted.  Heart: S1+S2+0, regular with a possible faint systolic heart murmur noted.    Abdomen: Soft, non-tender and non-distended.  Extremities: There is nonpitting puffiness in the distal lower extremities bilaterally.    Skin: Warm and dry without trophic changes noted.   Musculoskeletal: exam reveals no obvious joint deformities, walks with a 3 pronged cane.   Neurologically:  Mental status: The patient is awake, pays attention but is not able to provide a very detailed history.   Thought process is linear. Mood is normal and affect is normal.  Cranial nerves II - XII are as described above under HEENT exam.  Motor exam: Normal bulk, moving all 4 extremities but walks slowly and walks with a cane.  There is no obvious action or resting tremor.  Fine motor skills and coordination: Intact grossly.  Cerebellar testing: No dysmetria or intention tremor. There is no truncal or gait ataxia.  Sensory exam: intact to light touch in the upper and lower extremities.  Gait, station and balance: She stands slowly and walks with a cane.   Assessment and Plan:   In summary, ALANEA WOOLRIDGE is an 83 year old female with an underlying medical history of arthritis, back pain, coronary artery disease, with history of MI, status post CABG in 2021, Paroxysmal atrial fibrillation, carotid artery stenosis, chronic systolic congestive heart failure,depression, anxiety, diabetes, gout, history of kidney stones, hypertension, hyperlipidemia, leg edema, chronic pancreatitis, history of stroke, memory loss (for which she had seen Dr. Ines in this clinic in 2021 and 2024), and obesity, who presents for evaluation of her obstructive sleep apnea which was deemed in the moderate range back in April 2021 with limited sleep achieved and no REM sleep achieved at the time, likely  underestimating her sleep disordered breathing to some degree.  Her original sleep apnea diagnosis dates back to years prior.  She is compliant with her AutoPap machine with adequate apnea control.  She is not quite eligible for a new machine.  She is commended for her treatment adherence and advised to continue with her current machine on the current settings.  I will place an order for updated supplies for her to her DME company, [employer].  She is advised to continue to work on good sleep habits, and follow-up with her current providers as scheduled/planned.  She is advised to follow-up routinely in our sleep clinic to see one of our nurse practitioners in about a year.  I answered all her questions today and she was in agreement.   Thank you very much for allowing me to participate in the care of this nice patient. If I can be of any further assistance to you please do not hesitate to call me at [phone].  Sincerely,   True Mar, MD, PhD      [1] No Known Allergies

## 2024-11-30 NOTE — Progress Notes (Signed)
 [name]

## 2024-12-04 ENCOUNTER — Ambulatory Visit
Admission: RE | Admit: 2024-12-04 | Discharge: 2024-12-04 | Disposition: A | Discharge status: Home / Self Care | Source: Ambulatory Visit | Attending: Nephrology | Admitting: Nephrology

## 2024-12-04 DIAGNOSIS — N184 Chronic kidney disease, stage 4 (severe): Secondary | ICD-10-CM

## 2025-01-05 ENCOUNTER — Other Ambulatory Visit: Payer: Self-pay | Admitting: Internal Medicine

## 2025-01-05 DIAGNOSIS — R634 Abnormal weight loss: Secondary | ICD-10-CM

## 2025-11-30 ENCOUNTER — Ambulatory Visit: Admitting: Neurology
